# Patient Record
Sex: Female | Born: 1940
Health system: Southern US, Community
[De-identification: ages and names within clinical notes are randomized; demographics above are authoritative.]

## PROBLEM LIST (undated history)

## (undated) DIAGNOSIS — M1990 Unspecified osteoarthritis, unspecified site: Secondary | ICD-10-CM

## (undated) DIAGNOSIS — E785 Hyperlipidemia, unspecified: Secondary | ICD-10-CM

## (undated) DIAGNOSIS — T8859XA Other complications of anesthesia, initial encounter: Secondary | ICD-10-CM

## (undated) DIAGNOSIS — D649 Anemia, unspecified: Secondary | ICD-10-CM

## (undated) DIAGNOSIS — C4491 Basal cell carcinoma of skin, unspecified: Secondary | ICD-10-CM

## (undated) DIAGNOSIS — I2699 Other pulmonary embolism without acute cor pulmonale: Secondary | ICD-10-CM

## (undated) DIAGNOSIS — N189 Chronic kidney disease, unspecified: Secondary | ICD-10-CM

## (undated) DIAGNOSIS — I82409 Acute embolism and thrombosis of unspecified deep veins of unspecified lower extremity: Secondary | ICD-10-CM

## (undated) DIAGNOSIS — K219 Gastro-esophageal reflux disease without esophagitis: Secondary | ICD-10-CM

## (undated) DIAGNOSIS — G839 Paralytic syndrome, unspecified: Secondary | ICD-10-CM

## (undated) DIAGNOSIS — I10 Essential (primary) hypertension: Secondary | ICD-10-CM

## (undated) DIAGNOSIS — J8410 Pulmonary fibrosis, unspecified: Secondary | ICD-10-CM

## (undated) DIAGNOSIS — T4145XA Adverse effect of unspecified anesthetic, initial encounter: Secondary | ICD-10-CM

## (undated) HISTORY — DX: Basal cell carcinoma of skin, unspecified: C44.91

## (undated) HISTORY — PX: TUBAL LIGATION: SHX77

## (undated) HISTORY — DX: Hyperlipidemia, unspecified: E78.5

## (undated) HISTORY — DX: Other pulmonary embolism without acute cor pulmonale: I26.99

## (undated) HISTORY — DX: Essential (primary) hypertension: I10

## (undated) HISTORY — DX: Acute embolism and thrombosis of unspecified deep veins of unspecified lower extremity: I82.409

## (undated) HISTORY — PX: BUNIONECTOMY: SHX129

## (undated) HISTORY — PX: SEPTOPLASTY: SUR1290

## (undated) HISTORY — DX: Pulmonary fibrosis, unspecified: J84.10

## (undated) HISTORY — PX: COLONOSCOPY: SHX174

## (undated) HISTORY — DX: Paralytic syndrome, unspecified: G83.9

## (undated) HISTORY — PX: TONSILLECTOMY: SUR1361

## (undated) SURGERY — Surgical Case
Anesthesia: *Unknown

---

## 1961-05-30 DIAGNOSIS — N189 Chronic kidney disease, unspecified: Secondary | ICD-10-CM

## 1961-05-30 HISTORY — DX: Chronic kidney disease, unspecified: N18.9

## 1998-02-10 ENCOUNTER — Other Ambulatory Visit: Admission: RE | Admit: 1998-02-10 | Discharge: 1998-02-10 | Payer: Self-pay | Admitting: Obstetrics and Gynecology

## 1999-02-11 ENCOUNTER — Other Ambulatory Visit: Admission: RE | Admit: 1999-02-11 | Discharge: 1999-02-11 | Payer: Self-pay | Admitting: Obstetrics and Gynecology

## 2000-05-30 DIAGNOSIS — J8410 Pulmonary fibrosis, unspecified: Secondary | ICD-10-CM | POA: Insufficient documentation

## 2000-05-30 HISTORY — DX: Pulmonary fibrosis, unspecified: J84.10

## 2000-05-30 LAB — HM COLONOSCOPY

## 2000-06-20 ENCOUNTER — Other Ambulatory Visit: Admission: RE | Admit: 2000-06-20 | Discharge: 2000-06-20 | Payer: Self-pay | Admitting: Obstetrics and Gynecology

## 2000-11-09 ENCOUNTER — Other Ambulatory Visit: Admission: RE | Admit: 2000-11-09 | Discharge: 2000-11-09 | Payer: Self-pay | Admitting: Obstetrics and Gynecology

## 2001-05-18 ENCOUNTER — Other Ambulatory Visit: Admission: RE | Admit: 2001-05-18 | Discharge: 2001-05-18 | Payer: Self-pay | Admitting: Obstetrics and Gynecology

## 2001-09-07 ENCOUNTER — Other Ambulatory Visit: Admission: RE | Admit: 2001-09-07 | Discharge: 2001-09-07 | Payer: Self-pay | Admitting: Obstetrics and Gynecology

## 2002-05-30 DIAGNOSIS — I10 Essential (primary) hypertension: Secondary | ICD-10-CM

## 2002-05-30 HISTORY — DX: Essential (primary) hypertension: I10

## 2002-09-11 ENCOUNTER — Other Ambulatory Visit: Admission: RE | Admit: 2002-09-11 | Discharge: 2002-09-11 | Payer: Self-pay | Admitting: Obstetrics and Gynecology

## 2003-09-17 ENCOUNTER — Other Ambulatory Visit: Admission: RE | Admit: 2003-09-17 | Discharge: 2003-09-17 | Payer: Self-pay | Admitting: Obstetrics and Gynecology

## 2004-05-13 ENCOUNTER — Ambulatory Visit: Payer: Self-pay | Admitting: Internal Medicine

## 2004-05-30 DIAGNOSIS — G839 Paralytic syndrome, unspecified: Secondary | ICD-10-CM | POA: Insufficient documentation

## 2004-05-30 DIAGNOSIS — I2699 Other pulmonary embolism without acute cor pulmonale: Secondary | ICD-10-CM

## 2004-05-30 DIAGNOSIS — I82409 Acute embolism and thrombosis of unspecified deep veins of unspecified lower extremity: Secondary | ICD-10-CM

## 2004-05-30 HISTORY — DX: Paralytic syndrome, unspecified: G83.9

## 2004-05-30 HISTORY — PX: CERVICAL FUSION: SHX112

## 2004-05-30 HISTORY — PX: OTHER SURGICAL HISTORY: SHX169

## 2004-05-30 HISTORY — DX: Acute embolism and thrombosis of unspecified deep veins of unspecified lower extremity: I82.409

## 2004-05-30 HISTORY — DX: Other pulmonary embolism without acute cor pulmonale: I26.99

## 2004-07-04 ENCOUNTER — Encounter: Admission: RE | Admit: 2004-07-04 | Discharge: 2004-07-04 | Payer: Self-pay | Admitting: Family Medicine

## 2004-07-23 ENCOUNTER — Encounter: Admission: RE | Admit: 2004-07-23 | Discharge: 2004-07-23 | Payer: Self-pay | Admitting: Family Medicine

## 2004-07-27 ENCOUNTER — Ambulatory Visit: Payer: Self-pay | Admitting: Internal Medicine

## 2004-08-04 ENCOUNTER — Encounter: Admission: RE | Admit: 2004-08-04 | Discharge: 2004-08-04 | Payer: Self-pay | Admitting: Family Medicine

## 2004-08-11 ENCOUNTER — Ambulatory Visit: Payer: Self-pay | Admitting: Family Medicine

## 2004-09-09 ENCOUNTER — Ambulatory Visit: Payer: Self-pay | Admitting: Internal Medicine

## 2004-09-20 ENCOUNTER — Encounter: Admission: RE | Admit: 2004-09-20 | Discharge: 2004-09-20 | Payer: Self-pay | Admitting: Neurosurgery

## 2004-10-12 ENCOUNTER — Ambulatory Visit: Payer: Self-pay | Admitting: Physical Medicine & Rehabilitation

## 2004-10-14 ENCOUNTER — Inpatient Hospital Stay (HOSPITAL_COMMUNITY): Admission: RE | Admit: 2004-10-14 | Discharge: 2004-10-21 | Payer: Self-pay | Admitting: Neurosurgery

## 2004-10-21 ENCOUNTER — Inpatient Hospital Stay (HOSPITAL_COMMUNITY)
Admission: RE | Admit: 2004-10-21 | Discharge: 2004-10-28 | Payer: Self-pay | Admitting: Physical Medicine & Rehabilitation

## 2004-11-01 ENCOUNTER — Encounter
Admission: RE | Admit: 2004-11-01 | Discharge: 2005-01-30 | Payer: Self-pay | Admitting: Physical Medicine & Rehabilitation

## 2004-11-02 ENCOUNTER — Ambulatory Visit: Payer: Self-pay | Admitting: Internal Medicine

## 2004-11-11 ENCOUNTER — Encounter: Admission: RE | Admit: 2004-11-11 | Discharge: 2004-11-11 | Payer: Self-pay | Admitting: Neurosurgery

## 2004-11-12 ENCOUNTER — Inpatient Hospital Stay (HOSPITAL_COMMUNITY): Admission: AD | Admit: 2004-11-12 | Discharge: 2004-11-17 | Payer: Self-pay | Admitting: Internal Medicine

## 2004-11-12 ENCOUNTER — Ambulatory Visit: Payer: Self-pay | Admitting: Internal Medicine

## 2004-11-19 ENCOUNTER — Ambulatory Visit: Payer: Self-pay | Admitting: Internal Medicine

## 2004-11-26 ENCOUNTER — Ambulatory Visit: Payer: Self-pay | Admitting: Internal Medicine

## 2004-12-03 ENCOUNTER — Ambulatory Visit: Payer: Self-pay | Admitting: Cardiology

## 2004-12-16 ENCOUNTER — Encounter: Admission: RE | Admit: 2004-12-16 | Discharge: 2004-12-16 | Payer: Self-pay | Admitting: Neurosurgery

## 2004-12-20 ENCOUNTER — Ambulatory Visit: Payer: Self-pay | Admitting: Cardiology

## 2004-12-21 ENCOUNTER — Ambulatory Visit: Payer: Self-pay | Admitting: Internal Medicine

## 2004-12-27 ENCOUNTER — Ambulatory Visit: Payer: Self-pay | Admitting: Cardiology

## 2005-01-07 ENCOUNTER — Ambulatory Visit (HOSPITAL_COMMUNITY): Admission: RE | Admit: 2005-01-07 | Discharge: 2005-01-07 | Payer: Self-pay | Admitting: Family Medicine

## 2005-01-07 ENCOUNTER — Ambulatory Visit: Payer: Self-pay | Admitting: Family Medicine

## 2005-01-10 ENCOUNTER — Ambulatory Visit: Payer: Self-pay | Admitting: Cardiology

## 2005-01-19 ENCOUNTER — Other Ambulatory Visit: Admission: RE | Admit: 2005-01-19 | Discharge: 2005-01-19 | Payer: Self-pay | Admitting: Obstetrics and Gynecology

## 2005-01-20 ENCOUNTER — Ambulatory Visit: Payer: Self-pay | Admitting: Cardiology

## 2005-02-04 ENCOUNTER — Ambulatory Visit: Payer: Self-pay | Admitting: Cardiology

## 2005-02-18 ENCOUNTER — Ambulatory Visit: Payer: Self-pay | Admitting: Cardiology

## 2005-03-11 ENCOUNTER — Ambulatory Visit: Payer: Self-pay | Admitting: Cardiology

## 2005-03-22 ENCOUNTER — Ambulatory Visit: Payer: Self-pay | Admitting: Internal Medicine

## 2005-03-25 ENCOUNTER — Ambulatory Visit: Payer: Self-pay | Admitting: Internal Medicine

## 2005-04-12 ENCOUNTER — Ambulatory Visit: Payer: Self-pay | Admitting: Internal Medicine

## 2005-05-09 ENCOUNTER — Ambulatory Visit: Payer: Self-pay | Admitting: Internal Medicine

## 2005-05-10 ENCOUNTER — Ambulatory Visit: Payer: Self-pay | Admitting: Cardiology

## 2005-05-17 ENCOUNTER — Ambulatory Visit: Payer: Self-pay | Admitting: Internal Medicine

## 2005-05-27 ENCOUNTER — Ambulatory Visit: Payer: Self-pay

## 2005-06-14 ENCOUNTER — Ambulatory Visit: Payer: Self-pay | Admitting: *Deleted

## 2005-06-27 ENCOUNTER — Ambulatory Visit: Payer: Self-pay | Admitting: Cardiology

## 2005-07-08 ENCOUNTER — Ambulatory Visit: Payer: Self-pay | Admitting: Internal Medicine

## 2005-07-14 ENCOUNTER — Ambulatory Visit: Payer: Self-pay | Admitting: Cardiology

## 2005-08-04 ENCOUNTER — Ambulatory Visit: Payer: Self-pay | Admitting: Cardiology

## 2005-09-01 ENCOUNTER — Ambulatory Visit: Payer: Self-pay | Admitting: Cardiology

## 2005-09-06 ENCOUNTER — Ambulatory Visit: Payer: Self-pay | Admitting: Internal Medicine

## 2005-09-29 ENCOUNTER — Ambulatory Visit: Payer: Self-pay | Admitting: Cardiology

## 2005-10-20 ENCOUNTER — Ambulatory Visit: Payer: Self-pay | Admitting: Internal Medicine

## 2005-11-04 ENCOUNTER — Ambulatory Visit: Payer: Self-pay | Admitting: Cardiology

## 2005-11-04 ENCOUNTER — Ambulatory Visit: Payer: Self-pay

## 2006-01-05 ENCOUNTER — Ambulatory Visit: Payer: Self-pay | Admitting: Internal Medicine

## 2006-02-28 ENCOUNTER — Ambulatory Visit: Payer: Self-pay | Admitting: Internal Medicine

## 2006-03-16 ENCOUNTER — Ambulatory Visit: Payer: Self-pay | Admitting: Internal Medicine

## 2006-04-07 ENCOUNTER — Ambulatory Visit: Payer: Self-pay | Admitting: Internal Medicine

## 2006-04-13 ENCOUNTER — Ambulatory Visit: Payer: Self-pay | Admitting: Internal Medicine

## 2006-05-08 ENCOUNTER — Ambulatory Visit: Payer: Self-pay | Admitting: Internal Medicine

## 2006-05-08 LAB — CONVERTED CEMR LAB
Basophils Absolute: 0 10*3/uL (ref 0.0–0.1)
Eosinophil percent: 2 % (ref 0.0–5.0)
MCV: 88.5 fL (ref 78.0–100.0)
Platelets: 321 10*3/uL (ref 150–400)
Potassium: 4.1 meq/L (ref 3.5–5.1)
RBC: 3.94 M/uL (ref 3.87–5.11)
RDW: 13.8 % (ref 11.5–14.6)
T3, Free: 3 pg/mL (ref 2.3–4.2)
TSH: 5.88 microintl units/mL — ABNORMAL HIGH (ref 0.35–5.50)
WBC: 6.2 10*3/uL (ref 4.5–10.5)

## 2006-07-27 ENCOUNTER — Ambulatory Visit: Payer: Self-pay | Admitting: Internal Medicine

## 2006-08-31 DIAGNOSIS — Z86711 Personal history of pulmonary embolism: Secondary | ICD-10-CM | POA: Insufficient documentation

## 2006-08-31 DIAGNOSIS — I2699 Other pulmonary embolism without acute cor pulmonale: Secondary | ICD-10-CM

## 2006-11-24 ENCOUNTER — Ambulatory Visit: Payer: Self-pay | Admitting: Internal Medicine

## 2006-12-05 ENCOUNTER — Ambulatory Visit: Payer: Self-pay | Admitting: Internal Medicine

## 2006-12-08 ENCOUNTER — Encounter: Admission: RE | Admit: 2006-12-08 | Discharge: 2006-12-08 | Payer: Self-pay | Admitting: Family Medicine

## 2007-04-04 ENCOUNTER — Ambulatory Visit: Payer: Self-pay | Admitting: Internal Medicine

## 2007-04-12 ENCOUNTER — Ambulatory Visit: Payer: Self-pay | Admitting: Internal Medicine

## 2007-05-14 ENCOUNTER — Ambulatory Visit: Payer: Self-pay | Admitting: Internal Medicine

## 2007-05-14 DIAGNOSIS — M25519 Pain in unspecified shoulder: Secondary | ICD-10-CM

## 2007-05-14 DIAGNOSIS — E782 Mixed hyperlipidemia: Secondary | ICD-10-CM | POA: Insufficient documentation

## 2007-05-14 DIAGNOSIS — J4520 Mild intermittent asthma, uncomplicated: Secondary | ICD-10-CM | POA: Insufficient documentation

## 2007-05-14 LAB — CONVERTED CEMR LAB
Cholesterol, target level: 200 mg/dL
HDL goal, serum: 40 mg/dL

## 2007-05-26 LAB — CONVERTED CEMR LAB
Basophils Relative: 0.4 % (ref 0.0–1.0)
CO2: 29 meq/L (ref 19–32)
Creatinine, Ser: 0.9 mg/dL (ref 0.4–1.2)
Direct LDL: 150.4 mg/dL
Glucose, Bld: 121 mg/dL — ABNORMAL HIGH (ref 70–99)
HCT: 35.4 % — ABNORMAL LOW (ref 36.0–46.0)
Hemoglobin: 12.3 g/dL (ref 12.0–15.0)
Hgb A1c MFr Bld: 6.5 % — ABNORMAL HIGH (ref 4.6–6.0)
Monocytes Absolute: 0.6 10*3/uL (ref 0.2–0.7)
Neutrophils Relative %: 67.1 % (ref 43.0–77.0)
Potassium: 4.2 meq/L (ref 3.5–5.1)
RDW: 13.2 % (ref 11.5–14.6)
Sodium: 140 meq/L (ref 135–145)
Total Bilirubin: 0.7 mg/dL (ref 0.3–1.2)
Total CHOL/HDL Ratio: 4.6
Total Protein: 7.4 g/dL (ref 6.0–8.3)
VLDL: 27 mg/dL (ref 0–40)

## 2007-05-28 ENCOUNTER — Encounter (INDEPENDENT_AMBULATORY_CARE_PROVIDER_SITE_OTHER): Payer: Self-pay | Admitting: *Deleted

## 2007-05-31 HISTORY — PX: ROTATOR CUFF REPAIR: SHX139

## 2007-06-08 ENCOUNTER — Ambulatory Visit: Payer: Self-pay | Admitting: Internal Medicine

## 2007-06-08 DIAGNOSIS — E119 Type 2 diabetes mellitus without complications: Secondary | ICD-10-CM

## 2007-06-12 ENCOUNTER — Ambulatory Visit: Payer: Self-pay | Admitting: Internal Medicine

## 2007-06-12 ENCOUNTER — Encounter (INDEPENDENT_AMBULATORY_CARE_PROVIDER_SITE_OTHER): Payer: Self-pay | Admitting: *Deleted

## 2007-08-06 ENCOUNTER — Ambulatory Visit: Payer: Self-pay | Admitting: Internal Medicine

## 2007-08-06 DIAGNOSIS — R030 Elevated blood-pressure reading, without diagnosis of hypertension: Secondary | ICD-10-CM | POA: Insufficient documentation

## 2007-08-06 DIAGNOSIS — M8580 Other specified disorders of bone density and structure, unspecified site: Secondary | ICD-10-CM | POA: Insufficient documentation

## 2007-08-07 ENCOUNTER — Encounter: Payer: Self-pay | Admitting: Internal Medicine

## 2007-08-10 ENCOUNTER — Encounter (INDEPENDENT_AMBULATORY_CARE_PROVIDER_SITE_OTHER): Payer: Self-pay | Admitting: *Deleted

## 2007-08-10 LAB — CONVERTED CEMR LAB: Microalb Creat Ratio: 14.1 mg/g (ref 0.0–30.0)

## 2007-08-19 ENCOUNTER — Encounter: Admission: RE | Admit: 2007-08-19 | Discharge: 2007-08-19 | Payer: Self-pay | Admitting: Family Medicine

## 2007-09-26 ENCOUNTER — Ambulatory Visit: Payer: Self-pay | Admitting: Internal Medicine

## 2007-10-08 DIAGNOSIS — J3089 Other allergic rhinitis: Secondary | ICD-10-CM

## 2007-10-08 DIAGNOSIS — J302 Other seasonal allergic rhinitis: Secondary | ICD-10-CM

## 2007-10-09 ENCOUNTER — Encounter: Payer: Self-pay | Admitting: Internal Medicine

## 2007-10-16 ENCOUNTER — Ambulatory Visit (HOSPITAL_COMMUNITY): Admission: RE | Admit: 2007-10-16 | Discharge: 2007-10-17 | Payer: Self-pay | Admitting: Orthopedic Surgery

## 2007-11-15 ENCOUNTER — Ambulatory Visit: Payer: Self-pay | Admitting: Internal Medicine

## 2007-11-16 ENCOUNTER — Ambulatory Visit: Payer: Self-pay | Admitting: Internal Medicine

## 2007-11-27 ENCOUNTER — Encounter (INDEPENDENT_AMBULATORY_CARE_PROVIDER_SITE_OTHER): Payer: Self-pay | Admitting: *Deleted

## 2007-11-27 LAB — CONVERTED CEMR LAB: Vit D, 1,25-Dihydroxy: 45 (ref 30–89)

## 2008-01-17 ENCOUNTER — Ambulatory Visit: Payer: Self-pay | Admitting: Internal Medicine

## 2008-03-17 ENCOUNTER — Telehealth (INDEPENDENT_AMBULATORY_CARE_PROVIDER_SITE_OTHER): Payer: Self-pay | Admitting: *Deleted

## 2008-05-14 ENCOUNTER — Ambulatory Visit: Payer: Self-pay | Admitting: Internal Medicine

## 2008-05-29 ENCOUNTER — Ambulatory Visit: Payer: Self-pay | Admitting: Emergency Medicine

## 2008-06-10 ENCOUNTER — Ambulatory Visit: Payer: Self-pay | Admitting: Internal Medicine

## 2008-06-16 ENCOUNTER — Ambulatory Visit: Payer: Self-pay | Admitting: Internal Medicine

## 2008-06-16 DIAGNOSIS — M25579 Pain in unspecified ankle and joints of unspecified foot: Secondary | ICD-10-CM

## 2008-06-18 ENCOUNTER — Encounter (INDEPENDENT_AMBULATORY_CARE_PROVIDER_SITE_OTHER): Payer: Self-pay | Admitting: *Deleted

## 2008-06-18 LAB — CONVERTED CEMR LAB
Basophils Absolute: 0 10*3/uL (ref 0.0–0.1)
Basophils Relative: 0.1 % (ref 0.0–3.0)
Calcium: 9.3 mg/dL (ref 8.4–10.5)
Chloride: 106 meq/L (ref 96–112)
Cholesterol: 191 mg/dL (ref 0–200)
Creatinine, Ser: 0.9 mg/dL (ref 0.4–1.2)
Creatinine,U: 121.3 mg/dL
Eosinophils Absolute: 0.1 10*3/uL (ref 0.0–0.7)
GFR calc Af Amer: 80 mL/min
GFR calc non Af Amer: 66 mL/min
Hgb A1c MFr Bld: 6.5 % — ABNORMAL HIGH (ref 4.6–6.0)
MCHC: 34 g/dL (ref 30.0–36.0)
MCV: 83.8 fL (ref 78.0–100.0)
Microalb Creat Ratio: 2.5 mg/g (ref 0.0–30.0)
Microalb, Ur: 0.3 mg/dL (ref 0.0–1.9)
Monocytes Absolute: 0.6 10*3/uL (ref 0.1–1.0)
Neutro Abs: 7.3 10*3/uL (ref 1.4–7.7)
Neutrophils Relative %: 77.6 % — ABNORMAL HIGH (ref 43.0–77.0)
RBC: 4.39 M/uL (ref 3.87–5.11)
Total Bilirubin: 0.6 mg/dL (ref 0.3–1.2)
Uric Acid, Serum: 5.2 mg/dL (ref 2.4–7.0)
VLDL: 34 mg/dL (ref 0–40)

## 2008-06-19 ENCOUNTER — Telehealth (INDEPENDENT_AMBULATORY_CARE_PROVIDER_SITE_OTHER): Payer: Self-pay | Admitting: *Deleted

## 2008-06-20 ENCOUNTER — Encounter: Admission: RE | Admit: 2008-06-20 | Discharge: 2008-06-20 | Payer: Self-pay | Admitting: Family Medicine

## 2008-06-20 ENCOUNTER — Encounter: Payer: Self-pay | Admitting: Internal Medicine

## 2008-06-23 ENCOUNTER — Encounter: Payer: Self-pay | Admitting: Internal Medicine

## 2008-06-23 ENCOUNTER — Ambulatory Visit: Payer: Self-pay | Admitting: Internal Medicine

## 2008-07-07 ENCOUNTER — Encounter (INDEPENDENT_AMBULATORY_CARE_PROVIDER_SITE_OTHER): Payer: Self-pay | Admitting: *Deleted

## 2008-07-18 ENCOUNTER — Telehealth (INDEPENDENT_AMBULATORY_CARE_PROVIDER_SITE_OTHER): Payer: Self-pay | Admitting: *Deleted

## 2008-08-12 ENCOUNTER — Telehealth (INDEPENDENT_AMBULATORY_CARE_PROVIDER_SITE_OTHER): Payer: Self-pay | Admitting: *Deleted

## 2008-10-30 ENCOUNTER — Telehealth (INDEPENDENT_AMBULATORY_CARE_PROVIDER_SITE_OTHER): Payer: Self-pay | Admitting: *Deleted

## 2008-11-04 ENCOUNTER — Ambulatory Visit: Payer: Self-pay | Admitting: Internal Medicine

## 2008-11-06 ENCOUNTER — Ambulatory Visit: Payer: Self-pay | Admitting: Internal Medicine

## 2008-11-10 ENCOUNTER — Ambulatory Visit: Payer: Self-pay | Admitting: Internal Medicine

## 2008-11-10 ENCOUNTER — Encounter (INDEPENDENT_AMBULATORY_CARE_PROVIDER_SITE_OTHER): Payer: Self-pay | Admitting: *Deleted

## 2009-01-14 ENCOUNTER — Ambulatory Visit: Payer: Self-pay | Admitting: Internal Medicine

## 2009-03-20 ENCOUNTER — Telehealth (INDEPENDENT_AMBULATORY_CARE_PROVIDER_SITE_OTHER): Payer: Self-pay | Admitting: *Deleted

## 2009-05-11 ENCOUNTER — Ambulatory Visit: Payer: Self-pay | Admitting: Internal Medicine

## 2009-07-08 ENCOUNTER — Encounter: Payer: Self-pay | Admitting: Internal Medicine

## 2009-07-14 ENCOUNTER — Ambulatory Visit: Payer: Self-pay | Admitting: Internal Medicine

## 2009-09-16 ENCOUNTER — Ambulatory Visit: Payer: Self-pay | Admitting: Internal Medicine

## 2009-09-16 DIAGNOSIS — N959 Unspecified menopausal and perimenopausal disorder: Secondary | ICD-10-CM | POA: Insufficient documentation

## 2009-09-16 DIAGNOSIS — M2550 Pain in unspecified joint: Secondary | ICD-10-CM | POA: Insufficient documentation

## 2009-09-16 LAB — CONVERTED CEMR LAB: Microalb Creat Ratio: 2.5 mg/g (ref 0.0–30.0)

## 2009-09-17 LAB — CONVERTED CEMR LAB
ALT: 23 units/L (ref 0–35)
Alkaline Phosphatase: 74 units/L (ref 39–117)
BUN: 13 mg/dL (ref 6–23)
Bilirubin, Direct: 0.1 mg/dL (ref 0.0–0.3)
Calcium: 9.2 mg/dL (ref 8.4–10.5)
Cholesterol: 215 mg/dL — ABNORMAL HIGH (ref 0–200)
Creatinine, Ser: 1 mg/dL (ref 0.4–1.2)
Eosinophils Relative: 2 % (ref 0.0–5.0)
GFR calc non Af Amer: 58.49 mL/min (ref >60)
HDL: 58.9 mg/dL (ref >39.00)
Hgb A1c MFr Bld: 6.5 % (ref 4.6–6.5)
Lymphocytes Relative: 21.2 % (ref 12.0–46.0)
MCV: 87.7 fL (ref 78.0–100.0)
Monocytes Absolute: 0.6 10*3/uL (ref 0.1–1.0)
Monocytes Relative: 6.7 % (ref 3.0–12.0)
Neutrophils Relative %: 69.7 % (ref 43.0–77.0)
Platelets: 250 10*3/uL (ref 150.0–400.0)
Total Bilirubin: 0.6 mg/dL (ref 0.3–1.2)
Triglycerides: 125 mg/dL (ref 0.0–149.0)
VLDL: 25 mg/dL (ref 0.0–40.0)
WBC: 8.2 10*3/uL (ref 4.5–10.5)

## 2009-10-13 ENCOUNTER — Telehealth (INDEPENDENT_AMBULATORY_CARE_PROVIDER_SITE_OTHER): Payer: Self-pay | Admitting: *Deleted

## 2009-10-15 ENCOUNTER — Ambulatory Visit: Payer: Self-pay | Admitting: Internal Medicine

## 2009-11-09 ENCOUNTER — Ambulatory Visit: Payer: Self-pay | Admitting: Internal Medicine

## 2010-02-03 ENCOUNTER — Telehealth: Payer: Self-pay | Admitting: Internal Medicine

## 2010-03-02 ENCOUNTER — Ambulatory Visit: Payer: Self-pay | Admitting: Internal Medicine

## 2010-04-28 ENCOUNTER — Telehealth (INDEPENDENT_AMBULATORY_CARE_PROVIDER_SITE_OTHER): Payer: Self-pay | Admitting: *Deleted

## 2010-05-10 ENCOUNTER — Ambulatory Visit: Payer: Self-pay | Admitting: Internal Medicine

## 2010-05-18 ENCOUNTER — Telehealth (INDEPENDENT_AMBULATORY_CARE_PROVIDER_SITE_OTHER): Payer: Self-pay | Admitting: *Deleted

## 2010-06-09 ENCOUNTER — Telehealth (INDEPENDENT_AMBULATORY_CARE_PROVIDER_SITE_OTHER): Payer: Self-pay | Admitting: *Deleted

## 2010-06-14 ENCOUNTER — Telehealth (INDEPENDENT_AMBULATORY_CARE_PROVIDER_SITE_OTHER): Payer: Self-pay | Admitting: *Deleted

## 2010-06-15 ENCOUNTER — Ambulatory Visit
Admission: RE | Admit: 2010-06-15 | Discharge: 2010-06-15 | Payer: Self-pay | Source: Home / Self Care | Attending: Internal Medicine | Admitting: Internal Medicine

## 2010-06-15 DIAGNOSIS — J069 Acute upper respiratory infection, unspecified: Secondary | ICD-10-CM | POA: Insufficient documentation

## 2010-06-18 ENCOUNTER — Ambulatory Visit: Payer: Self-pay | Admitting: Internal Medicine

## 2010-06-20 ENCOUNTER — Encounter: Payer: Self-pay | Admitting: Family Medicine

## 2010-06-29 NOTE — Progress Notes (Signed)
Summary: Proair refill  Phone Note From Pharmacy      New/Updated Medications: PROAIR HFA 108 (90 BASE) MCG/ACT  AERS (ALBUTEROL SULFATE) 2 puffs four times a day as needed rescue Prescriptions: PROAIR HFA 108 (90 BASE) MCG/ACT  AERS (ALBUTEROL SULFATE) 2 puffs four times a day as needed rescue  #3 x 3   Entered and Authorized by:   Waymon Budge MD   Signed by:   Waymon Budge MD on 02/03/2010   Method used:   Printed then faxed to ...       [facility] (retail)       [address]       [place], [place]  [address]       Ph: [phone]       Fax: [phone]   RxID:   [id number]

## 2010-06-29 NOTE — Progress Notes (Signed)
Summary: Refill Request  Phone Note From Pharmacy   Caller: [facility] Summary of Call: Patient would like to change chlosterol med to Simvastatin because it is on [facility] 100pills for 10 dollars list.   Per Dr.Hopper ok to change./[facility]  Oct 13, 2009 1:54 PM     New/Updated Medications: SIMVASTATIN 20 MG TABS (SIMVASTATIN) 1 by mouth at bedtime Prescriptions: SIMVASTATIN 20 MG TABS (SIMVASTATIN) 1 by mouth at bedtime  #100 x 0   Entered by:   Shonna Chock   Authorized by:   Marga Melnick MD   Signed by:   Shonna Chock on 10/13/2009   Method used:   Faxed to ...       [facility] (retail)       [address]       [place], [place]  [address]       Ph: [phone]       Fax: [phone]   RxID:   [id number]

## 2010-06-29 NOTE — Assessment & Plan Note (Signed)
Summary: rov 6 months///kp   Primary Provider/Referring Provider:  Alwyn Ren  CC:  6 month follow up visit-allergies-congestion, drainage, and cough-light yellow in color. .  History of Present Illness:  05/14/08- Allergic rhinitis, asthma Tends to wake hoarse, suspects she snores but is alone. Likes Singulair. Uses Clarinex and dicussed claritin. No major flares needing neb/ steroid in long time.Had both flu vax. Denies wheeze, cough, phlegm.  11/10/08- Allergic rhinitis, asthma Got hoarse again about a week ago. She finds clarinex works better for her than claritin- we discussed this. Denies recent cold, reflux, sore throat. a little wheezy on forced exhalation. Hoarse before trip to mountains and maybe a little worse by return. Using albuterol more often. Discussed mold spores and current rainy weather.   May 11, 2009- Allergic rhinitis, asthma Feeling pretty well overall, but mentions some nasal drainage/ yellow. Occasional sneeze, but denies headaches, fever, sore throat, ear ache. Sometimes left ear congested- not now.  Uses Advair 250- lower dose didn't hold her.   November 09, 2009- Allergic rhinitis, asthma Had flare of nasal congestion and drainage  a few weeks ago. Given amoxacillin incidentally from her orthopedist. Used Neti pot, bed rest x 2 days. She assumed viral- no energy. She thought she was well, but starting lat week she has relapsed with nasal congestion, drainage, yellow discharge. Denies fever, earache, headache, deep green r brown. A little chest congestion. No GI upset. had variabley used Advair 250 or 100. Wheeze started yesterday. She has been using Advair 250 in AM, 100 at night.    Asthma History    Asthma Control Assessment:    Age range: 12+ years    Symptoms: >2 days/week    Nighttime Awakenings: 0-2/month    Interferes w/ normal activity: some limitations    SABA use (not for EIB): >2 days/week    Asthma Control Assessment: Not Well  Controlled   Preventive Screening-Counseling & Management  Alcohol-Tobacco     Smoking Status: never  Current Medications (verified): 1)  Advair Diskus 250-50 Mcg/dose Misc (Fluticasone-Salmeterol) .[name].. 1 Puff Twice A Day 2)  Singulair 10 Mg  Tabs (Montelukast Sodium) .[name].. 1 By Mouth Qd 3)  Claritin 10 Mg Tabs (Loratadine) .... Once Daily 4)  Freestyle Lite   Strp (Glucose Blood) .... Use One Daily 5)  Freestyle Unistick Ii Lancets   Misc (Lancets) .... Use One Daily 6)  Metformin Hcl 500 Mg  Tb24 (Metformin Hcl) .[name].. 1  Two Times A Day With 2 Largest Meals 7)  Allergy Vaccine  1:10 8)  Epipen 0.3 Mg/0.51ml (1:1000)  Devi (Epinephrine Hcl (Anaphylaxis)) .... For Severe Allergic Reaction 9)  Proair Hfa 108 (90 Base) Mcg/act  Aers (Albuterol Sulfate) .... As Needed 10)  Prilosec 20 Mg  Cpdr (Omeprazole) .... As Needed 11)  Nasonex 50 Mcg/act  Susp (Mometasone Furoate) .[name].. 1-2 Sprays Each Nostril Daily 12)  Astepro 0.15 % Soln (Azelastine Hcl) .[name].. 1-2 Sprays Two Times A Day As Needed 13)  Arthrotec 75-200 Mg-Mcg Tabs (Diclofenac-Misoprostol) .... Take 1 By Mouth Once Daily 14)  Hydrocodone-Acetaminophen 5-325 Mg Tabs (Hydrocodone-Acetaminophen) .... As Needed 15)  Simvastatin 20 Mg Tabs (Simvastatin) .[name].. 1 By Mouth At Bedtime  Allergies (verified): No Known Drug Allergies  Past History:  Family History: Last updated: 09/16/2009 maternal grandmother cva paternal aunt mini stroke Mother-deceased age 76; cns  cancer; allergies Father-deceased age 78; emphysema,allergies Sister-  DM, heart disease/ stents,fibromyalgia, thyroid disease Sister- arthritis, Malachi Carl  Social History: Last updated: 09/16/2009 Never Smoked Alcohol  use-yes socially No diet Divorced  Retired Regular exercise-yes: treadmill, Nautilus, elliptical  Risk Factors: Exercise: yes (09/16/2009)  Risk Factors: Smoking Status: never (11/09/2009)  Past medical, surgical, family and social histories  (including risk factors) reviewed for relevance to current acute and chronic problems.  Past Medical History: Reviewed history from 09/16/2009 and no changes required. Asthma Hyperlipidemia PTE/ DVT post op cervical spine surgery 2006 AODM (ICD-250.00) ELEVATED BLOOD PRESSURE WITHOUT DIAGNOSIS OF HYPERTENSION (ICD-796.2)(HTN @ stress test 2004) Remote granulomatous disease Allergic rhinitis Porokeratosis, Dr Karlyn Agee   Past Surgical History: Reviewed history from 09/16/2009 and no changes required. bunionectomy Tonsillectomy septoplasty tubal ligation  gravid 2 para 2 epidual/steroids 2006 cervical disc fusion, Dr Venora Maples post op, cns leak 09/2004, complicated by left hemiplegia temporarily post op & urinary retention PE/DVT  6/06 post op cervical fusions   colonoscopy 11/2005, Dr Loreta Ave, due 2012 Rotator cuff repair R shoulder 2009  Family History: Reviewed history from 09/16/2009 and no changes required. maternal grandmother cva paternal aunt mini stroke Mother-deceased age 22; cns  cancer; allergies Father-deceased age 71; emphysema,allergies Sister-  DM, heart disease/ stents,fibromyalgia, thyroid disease Sister- arthritis, Malachi Carl  Social History: Reviewed history from 09/16/2009 and no changes required. Never Smoked Alcohol use-yes socially No diet Divorced  Retired Regular exercise-yes: treadmill, Nautilus, elliptical  Review of Systems      See HPI       The patient complains of shortness of breath with activity, productive cough, and nasal congestion/difficulty breathing through nose.  The patient denies non-productive cough, coughing up blood, chest pain, irregular heartbeats, acid heartburn, indigestion, loss of appetite, weight change, abdominal pain, difficulty swallowing, sore throat, tooth/dental problems, headaches, and sneezing.    Vital Signs:  Patient profile:   70 year old female Height:      63.75 inches Weight:      148 pounds BMI:      25.70 O2 Sat:      97 % on Room air Pulse rate:   75 / minute BP sitting:   136 / 76  (left arm) Cuff size:   regular  Vitals Entered By: Reynaldo Minium CMA (November 09, 2009 9:46 AM)  O2 Flow:  Room air CC: 6 month follow up visit-allergies-congestion, drainage, cough-light yellow in color.    Physical Exam  Additional Exam:  General: A/Ox3; pleasant and cooperative, NAD, SKIN: no rash, lesions NODES: no lymphadenopathy HEENT: Mallard/AT, EOM- WNL, Conjuctivae- clear, PERRLA, TM-WNL, Nose- nasal, Throat- minimal thrush, Mallampati III, no stridor, no drip NECK: Supple w/ fair ROM, JVD- none, normal carotid impulses w/o bruits Thyroid-  CHEST: bilateral wheeze and dry cough HEART: RRR, no m/g/r heard ABDOMEN: Soft and nl; nml bowel sounds; no organomegaly or masses noted LOV:FIEP, nl pulses, no edema  NEURO: Grossly intact to observation- moves well bilaterally      Impression & Recommendations:  Problem # 1:  ALLERGIC RHINITIS (ICD-477.9)  Background of rhinitis, but this is rhinosinusitis, likely with bacterial infection. Will use biaxin.    Claritin 10 Mg Tabs (Loratadine) ..... Once daily    Nasonex 50 Mcg/act Susp (Mometasone furoate) .[name]... 1-2 sprays each nostril daily    Astepro 0.15 % Soln (Azelastine hcl) .[name]... 1-2 sprays two times a day as needed  Problem # 2:  ASTHMA (ICD-493.90) She needs meds refilled- done and reconciled.  Problem # 3:  DVT (ICD-453.40) No recurrence of venous thromboembolic disease. We reviewd measures to avoid stasis.  Medications Added to Medication List This Visit: 1)  Arthrotec 75-200 Mg-mcg Tabs (Diclofenac-misoprostol) .... Take 1 by mouth once daily 2)  Clarithromycin 500 Mg Tabs (Clarithromycin) .[name].. 1 twice daily after meals 3)  Advair Diskus 100-50 Mcg/dose Aepb (Fluticasone-salmeterol) .[name].. 1 puff and rinse mouth, twice daily  Other Orders: Est. Patient Level IV (30865)  Patient Instructions: 1)  Please schedule a follow-up  appointment in 6 months. 2)  Scripts refilled and antibiotic sent to drug store 3)  Mucinex may help thin mucus, along with extra fluids. Prescriptions: ADVAIR DISKUS 100-50 MCG/DOSE AEPB (FLUTICASONE-SALMETEROL) 1 puff and rinse mouth, twice daily  #3 x 3   Entered by:   Reynaldo Minium CMA   Authorized by:   Waymon Budge MD   Signed by:   Reynaldo Minium CMA on 11/09/2009   Method used:   Print then Give to Patient   RxID:   [id number] NASONEX 50 MCG/ACT  SUSP (MOMETASONE FUROATE) 1-2 sprays each nostril daily  #3 x 3   Entered by:   Reynaldo Minium CMA   Authorized by:   Waymon Budge MD   Signed by:   Reynaldo Minium CMA on 11/09/2009   Method used:   Print then Give to Patient   RxID:   [id number] PROAIR HFA 108 (90 BASE) MCG/ACT  AERS (ALBUTEROL SULFATE) as needed  #3 x 3   Entered by:   Reynaldo Minium CMA   Authorized by:   Waymon Budge MD   Signed by:   Reynaldo Minium CMA on 11/09/2009   Method used:   Print then Give to Patient   RxID:   [id number] ADVAIR DISKUS 250-50 MCG/DOSE MISC (FLUTICASONE-SALMETEROL) 1 puff twice a day  #3 x 3   Entered by:   Reynaldo Minium CMA   Authorized by:   Waymon Budge MD   Signed by:   Reynaldo Minium CMA on 11/09/2009   Method used:   Print then Give to Patient   RxID:   [id number] SINGULAIR 10 MG  TABS (MONTELUKAST SODIUM) 1 by mouth qd  #90 x 3   Entered by:   Reynaldo Minium CMA   Authorized by:   Waymon Budge MD   Signed by:   Reynaldo Minium CMA on 11/09/2009   Method used:   Print then Give to Patient   RxID:   [id number] EPIPEN 0.3 MG/0.3ML (1:1000)  DEVI (EPINEPHRINE HCL (ANAPHYLAXIS)) for severe allergic reaction  #1 x prn   Entered and Authorized by:   Waymon Budge MD   Signed by:   Waymon Budge MD on 11/09/2009   Method used:   Electronically to        [facility] (retail)       [address]       [place], [place]  [address]       Ph: [phone]       Fax:  [phone]   RxID:   [id number] ADVAIR DISKUS 100-50 MCG/DOSE AEPB (FLUTICASONE-SALMETEROL) 1 puff and rinse mouth, twice daily  #3 x 3   Entered and Authorized by:   Waymon Budge MD   Signed by:   Waymon Budge MD on 11/09/2009   Method used:   Electronically to        [facility] (retail)       [address]       [place], [place]  [id number]       Ph: [phone]       Fax: [phone]  RxID:   [id number] NASONEX 50 MCG/ACT  SUSP (MOMETASONE FUROATE) 1-2 sprays each nostril daily  #3 x 3   Entered and Authorized by:   Waymon Budge MD   Signed by:   Waymon Budge MD on 11/09/2009   Method used:   Electronically to        [facility] (retail)       [address]       [place], [place]  [address]       Ph: [phone]       Fax: [phone]   RxID:   [id number] PROAIR HFA 108 (90 BASE) MCG/ACT  AERS (ALBUTEROL SULFATE) as needed  #3 x 3   Entered and Authorized by:   Waymon Budge MD   Signed by:   Waymon Budge MD on 11/09/2009   Method used:   Electronically to        [facility] (retail)       [address]       [place], [place]  [address]       Ph: [phone]       Fax: [phone]   RxID:   [id number] SINGULAIR 10 MG  TABS (MONTELUKAST SODIUM) 1 by mouth qd  #90 x 3   Entered and Authorized by:   Waymon Budge MD   Signed by:   Waymon Budge MD on 11/09/2009   Method used:   Electronically to        [facility] (retail)       [address]       [place], [place]  [address]       Ph: [phone]       Fax: [phone]   RxID:   [id number] ADVAIR DISKUS 250-50 MCG/DOSE MISC (FLUTICASONE-SALMETEROL) 1 puff twice a day  #3 x 3   Entered and Authorized by:   Waymon Budge MD   Signed by:   Waymon Budge MD on 11/09/2009   Method used:   Electronically to        [facility] (retail)        [address]       [place], [place]  [address]       Ph: [phone]       Fax: [phone]   RxID:   [id number] ASTEPRO 0.15 % SOLN (AZELASTINE HCL) 1-2 sprays two times a day as needed  #1 x prn   Entered and Authorized by:   Waymon Budge MD   Signed by:   Waymon Budge MD on 11/09/2009   Method used:   Electronically to        [facility] (retail)       [address]       [place], [place]  [address]       Ph: [phone]       Fax: [phone]   RxID:   [id number] CLARITHROMYCIN 500 MG TABS (CLARITHROMYCIN) 1 twice daily after meals  #14 x 0   Entered and Authorized by:   Waymon Budge MD   Signed by:   Waymon Budge MD on 11/09/2009   Method used:   Electronically to        [facility] (retail)       [address]       [place], [place]  [address]       Ph:  [phone]       Fax: [phone]   RxID:   [id number]

## 2010-06-29 NOTE — Assessment & Plan Note (Signed)
Summary: EMP//PH   Vital Signs:  Patient profile:   70 year old female Height:      63.75 inches Weight:      151.6 pounds Temp:     98.4 degrees F oral Pulse rate:   64 / minute Resp:     14 per minute BP sitting:   128 / 84  (left arm) Cuff size:   regular  Vitals Entered By: Shonna Chock (September 16, 2009 9:22 AM)  CC: 1.) Yearly CPX with fasting 2.) Refill Meds 3.) Examine left arm-injured (fell), General Medical Evaluation Comments REVIEWED MED LIST, PATIENT AGREED DOSE AND INSTRUCTION CORRECT    Primary Care Provider:  Alwyn Ren  CC:  1.) Yearly CPX with fasting 2.) Refill Meds 3.) Examine left arm-injured (fell) and General Medical Evaluation.  History of Present Illness: [name] is here for a physical; she recently slipped & fell on stairs 09/13/2009 . She has residual pain & stiffness in neck , especially with with L lateral rotation.rx: Etodolac. Preventive measures reviewed; all are up to date including POA/ Living Will.  Preventive Screening-Counseling & Management  Caffeine-Diet-Exercise     Does Patient Exercise: yes  Allergies (verified): No Known Drug Allergies  Past History:  Past Medical History: Asthma Hyperlipidemia PTE/ DVT post op cervical spine surgery 2006 AODM (ICD-250.00) ELEVATED BLOOD PRESSURE WITHOUT DIAGNOSIS OF HYPERTENSION (ICD-796.2)(HTN @ stress test 2004) Remote granulomatous disease Allergic rhinitis Porokeratosis, Dr Karlyn Agee   Past Surgical History: bunionectomy Tonsillectomy septoplasty tubal ligation  gravid 2 para 2 epidual/steroids 2006 cervical disc fusion, Dr Venora Maples post op, cns leak 09/2004, complicated by left hemiplegia temporarily post op & urinary retention PE/DVT  6/06 post op cervical fusions   colonoscopy 11/2005, Dr Loreta Ave, due 2012 Rotator cuff repair R shoulder 2009  Family History: maternal grandmother cva paternal aunt mini stroke Mother-deceased age 49; cns  cancer; allergies Father-deceased age 26;  emphysema,allergies Sister-  DM, heart disease/ stents,fibromyalgia, thyroid disease Sister- arthritis, Epstein Barr  Social History: Never Smoked Alcohol use-yes socially No diet Divorced  Retired Regular exercise-yes: treadmill, Nautilus, elliptical Does Patient Exercise:  yes  Review of Systems General:  Denies fatigue; Pain in L hip & knee affect sleep. Eyes:  Denies blurring, double vision, and vision loss-both eyes; Exam 2011; no retinopathy. ENT:  Complains of postnasal drainage; denies difficulty swallowing and hoarseness; Minor congestion. CV:  Denies chest pain or discomfort, difficulty breathing at night, difficulty breathing while lying down, leg cramps with exertion, palpitations, swelling of feet, and swelling of hands. Resp:  Denies excessive snoring, hypersomnolence, morning headaches, and shortness of breath; Intermittent  cough & wheezing; rare use of rescue MDI with advair. Sputum from PNDrainage.[name] GI:  Denies abdominal pain, bloody stools, constipation, dark tarry stools, diarrhea, and indigestion. GU:  Denies discharge, dysuria, hematuria, and incontinence. MS:  Complains of joint pain; denies joint redness, joint swelling, low back pain, mid back pain, and thoracic pain. Derm:  Denies changes in nail beds, dryness, and hair loss. Neuro:  Complains of numbness; denies brief paralysis, tingling, and weakness; Numbness R hand over night. Psych:  Denies anxiety and depression. Endo:  Denies cold intolerance, excessive hunger, excessive thirst, excessive urination, and heat intolerance; FBS 65-90; no hypoglycemia. Heme:  Denies abnormal bruising and bleeding. Allergy:  Complains of itching eyes, seasonal allergies, and sneezing; Allergy shots weekly; Claritin  once daily .  Physical Exam  General:  well-nourished; alert,appropriate and cooperative throughout examination Head:  Normocephalic and atraumatic without obvious abnormalities.  Eyes:  No corneal or  conjunctival inflammation noted. Perrla. Funduscopic exam benign, without hemorrhages, exudates or papilledema.  Ears:  External ear exam shows no significant lesions or deformities.  Otoscopic examination reveals clear canals, tympanic membranes are intact bilaterally without bulging, retraction, inflammation or discharge. Hearing is grossly normal bilaterally. Nose:  External nasal examination shows no deformity or inflammation. Nasal mucosa are pink and moist without lesions or exudates. Mouth:  Oral mucosa and oropharynx without lesions or exudates.  Teeth in good repair. Neck:  No deformities, masses, or tenderness noted. Lungs:  Normal respiratory effort, chest expands symmetrically. Lungs are clear to auscultation, no crackles or wheezes. Heart:  normal rate, regular rhythm, no gallop, no rub, no JVD, no HJR, and grade 1/2 /6 systolic murmur.   Abdomen:  Bowel sounds positive,abdomen soft and non-tender without masses, organomegaly or hernias noted. Genitalia:  Dr Tenny Craw  Msk:  No deformity or scoliosis noted of thoracic or lumbar spine.   Pulses:  R and L carotid,radial,dorsalis pedis and posterior tibial pulses are full and equal bilaterally Extremities:  No clubbing, cyanosis, edema, or deformity noted with normal full range of motion of all joints.  Good nail health  Neurologic:  alert & oriented X3 and DTRs  slightly asymmetrical @ knees(L> R)but  normal.  Strength good in extremities. light touch normal normal over feet Skin:  Flat round hyperpigmented lesions , esp on legs Cervical Nodes:  No lymphadenopathy noted Axillary Nodes:  No palpable lymphadenopathy Psych:  memory intact for recent and remote, normally interactive, and good eye contact.     Impression & Recommendations:  Problem # 1:  PREVENTIVE HEALTH CARE (ICD-V70.0)  Orders: EKG w/ Interpretation (93000) Venipuncture (11914) TLB-Lipid Panel (80061-LIPID) TLB-BMP (Basic Metabolic Panel-BMET) (80048-METABOL) TLB-CBC  Platelet - w/Differential (85025-CBCD) TLB-Hepatic/Liver Function Pnl (80076-HEPATIC) TLB-TSH (Thyroid Stimulating Hormone) (84443-TSH) TLB-A1C / Hgb A1C (Glycohemoglobin) (83036-A1C) TLB-Microalbumin/Creat Ratio, Urine (82043-MALB) Radiology Referral (Radiology)  Problem # 2:  ARTHRALGIA (ICD-719.40)  Problem # 3:  ASTHMA (ICD-493.90) stable Her updated medication list for this problem includes:    Advair Diskus 250-50 Mcg/dose Misc (Fluticasone-salmeterol) .[name]... 1 puff twice a day    Singulair 10 Mg Tabs (Montelukast sodium) .[name]... 1 by mouth qd    Proair Hfa 108 (90 Base) Mcg/act Aers (Albuterol sulfate) .[name]... As needed  Problem # 4:  AODM (ICD-250.00)  PMH of; status to be assessed Her updated medication list for this problem includes:    Metformin Hcl 500 Mg Tb24 (Metformin hcl) .[name]... 1  two times a day with 2 largest meals  Orders: Venipuncture (78295) TLB-A1C / Hgb A1C (Glycohemoglobin) (83036-A1C) TLB-Microalbumin/Creat Ratio, Urine (82043-MALB)  Problem # 5:  ELEVATED BLOOD PRESSURE WITHOUT DIAGNOSIS OF HYPERTENSION (ICD-796.2)  Orders: EKG w/ Interpretation (93000) Venipuncture (62130)  Problem # 6:  HYPERLIPIDEMIA (ICD-272.2)  Orders: TLB-Lipid Panel (80061-LIPID)  Problem # 7:  POSTMENOPAUSAL SYNDROME (ICD-627.9)  Orders: Venipuncture (86578) Radiology Referral (Radiology)  Complete Medication List: 1)  Advair Diskus 250-50 Mcg/dose Misc (Fluticasone-salmeterol) .[name].. 1 puff twice a day 2)  Singulair 10 Mg Tabs (Montelukast sodium) .[name].. 1 by mouth qd 3)  Claritin 10 Mg Tabs (Loratadine) .... Once daily 4)  Freestyle Lite Strp (Glucose blood) .... Use one daily 5)  Freestyle Unistick Ii Lancets Misc (Lancets) .... Use one daily 6)  Metformin Hcl 500 Mg Tb24 (Metformin hcl) .[name].. 1  two times a day with 2 largest meals 7)  Allergy Vaccine 1:10  8)  Epipen 0.3 Mg/0.40ml (1:1000) Devi (Epinephrine hcl (anaphylaxis)) .Marland KitchenMarland KitchenMarland Kitchen  For severe allergic reaction 9)  Proair  Hfa 108 (90 Base) Mcg/act Aers (Albuterol sulfate) .... As needed 10)  Prilosec 20 Mg Cpdr (Omeprazole) .... As needed 11)  Nasonex 50 Mcg/act Susp (Mometasone furoate) .[name].. 1-2 sprays each nostril daily 12)  Astepro 0.15 % Soln (Azelastine hcl) .[name].. 1-2 sprays two times a day as needed 13)  Etodolac 500 Mg Tabs (Etodolac) .... Once daily 14)  Hydrocodone-acetaminophen 5-325 Mg Tabs (Hydrocodone-acetaminophen) .... As needed 15)  [facility] Magic Mouthwash  .... Swish and swallow 5 cc, four times a day  Patient Instructions: 1)  Check your blood sugars regularly. If your readings are usually above :150 or below 90 you should contact our office. 2)  See your eye doctor yearly to check for diabetic eye damage. 3)  Check your feet each night for sore areas, calluses or signs of infection. 4)  Check your Blood Pressure regularly. If it is above:135/85 ON AVERAGE you should make an appointment. Prescriptions: FREESTYLE LITE   STRP (GLUCOSE BLOOD) use one daily  #100 x 3   Entered and Authorized by:   Marga Melnick MD   Signed by:   Marga Melnick MD on 09/16/2009   Method used:   Print then Give to Patient   RxID:   [id number] METFORMIN HCL 500 MG  TB24 (METFORMIN HCL) 1  two times a day with 2 largest meals  #180 Tablet x 1   Entered and Authorized by:   Marga Melnick MD   Signed by:   Marga Melnick MD on 09/16/2009   Method used:   Print then Give to Patient   RxID:   [id number]

## 2010-06-29 NOTE — Letter (Signed)
Summary: [facility]  [facility]   Imported By: Lanelle Bal 07/16/2009 09:07:33  _____________________________________________________________________  External Attachment:    Type:   Image     Comment:   External Document

## 2010-06-29 NOTE — Progress Notes (Signed)
Summary: rx  Phone Note Call from Patient Call back at Home Phone [phone]   Caller: Patient Call For: [name] Reason for Call: Talk to Nurse Summary of Call: Just retired, need 3 rx's to send to mail order pharmacy - new plan - Advair 250/50, Singulair and Clarinex.  Can pick them up today or Monday. Initial call taken by: Eugene Gavia,  March 20, 2009 11:19 AM  Follow-up for Phone Call        Please sign rxs and return to triage thanks! Vernie Murders  March 20, 2009 11:24 AM  Follow-up by: Waymon Budge MD,  March 20, 2009 11:56 AM  Additional Follow-up for Phone Call Additional follow up Details #1::        Rxs were signed and placed up front for pick up.  LMOM for pt to be made aware. Additional Follow-up by: Vernie Murders,  March 20, 2009 12:00 PM    Prescriptions: CLARINEX 5 MG  TABS (DESLORATADINE) 1 by mouth qd  #90 x 3   Entered by:   Vernie Murders   Authorized by:   Waymon Budge MD   Signed by:   Vernie Murders on 03/20/2009   Method used:   Print then Give to Patient   RxID:   [id number] SINGULAIR 10 MG  TABS (MONTELUKAST SODIUM) 1 by mouth qd  #90 x 3   Entered by:   Vernie Murders   Authorized by:   Waymon Budge MD   Signed by:   Vernie Murders on 03/20/2009   Method used:   Print then Give to Patient   RxID:   [id number] ADVAIR DISKUS 250-50 MCG/DOSE MISC (FLUTICASONE-SALMETEROL) 1 puff twice a day  #3 x 3   Entered by:   Vernie Murders   Authorized by:   Waymon Budge MD   Signed by:   Vernie Murders on 03/20/2009   Method used:   Print then Give to Patient   RxID:   [id number]

## 2010-06-29 NOTE — Progress Notes (Signed)
Summary: sinus > z pak  Phone Note Call from Patient Call back at [phone]   Caller: Patient Call For: young Summary of Call: Pt c/o sinus, coughing up yellow sputum since "Sunday, wants to be seen today pls advise.//[facility] Initial call taken by: Juanita Davis,  April 28, 2010 9:57 AM  Follow-up for Phone Call        Pt c/o sinus congestion and cough with yellow sputum x3-4 days. She denies any increased sob, fever or sore throat. Occasional wheezing.Pls advise of any recs. Allergies (verified):  No Known Drug Allergies Lori Cooper CMA  April 28, 2010 11:38 AM  Additional Follow-up for Phone Call Additional follow up Details #1::        Per CDY-offer Zpak #1 take as directed no refills.Katie Welchel CMA  April 28, 2010 12:44 PM   Called, spoke with pt.  She was informed CY recs Z pak - take as directed.  She verbalized understanding of this and aware rx sent to [facility]  Additional Follow-up by: Crystal Jones RN,  April 28, 2010 12:46 PM    New/Updated Medications: ZITHROMAX Z-PAK 250 MG TABS (AZITHROMYCIN) take as directed Prescriptions: ZITHROMAX Z-PAK 250 MG TABS (AZITHROMYCIN) take as directed  #1 x 0   Entered by:   Crystal Jones RN   Authorized by:   Clinton D Young MD   Signed by:   Crystal Jones RN on 04/28/2010   Method used:   Electronically to        [facility] (retail)       [address]       [place], [place]  [address]       Ph: [phone]       Fax: [phone]   RxID:   [id number]

## 2010-07-01 NOTE — Progress Notes (Signed)
Summary: singulair prior auth-CDY to fill out PA info.-lmomtcb x 1   Phone Note Call from Patient Call back at Home Phone [phone]   Caller: Patient Call For: young Reason for Call: Talk to Nurse Summary of Call: Patient calling asking about the status of prior auth for singulair. Initial call taken by: Lehman Prom,  June 09, 2010 9:30 AM  Follow-up for Phone Call        LMTCBx1.  I do nto see any PA mentioned. I called [facility] and they do nto have an rx for this. Need to know where the pt got rx filled.  Carron Curie CMA  June 09, 2010 11:05 AM  Returning call.Darletta Moll  June 09, 2010 12:36 PM  I have the insurance compnay regarding this matter-too busy at this time and request we call back later. I tried calling the patient at her home number-phone disconnected at this time. Also, I called the patients work number-she will not be there until Friday. I will wait for patient to call back to inform her of this.Reynaldo Minium CMA  June 09, 2010 2:08 PM    Additional Follow-up for Phone Call Additional follow up Details #1::        I have called the PA dept for insurance company-they are having high volume of calls and request they be called back at a later time/date. There was a 3 hour wait time.Reynaldo Minium CMA  June 10, 2010 12:17 PM   Patient returning call. Lehman Prom  June 10, 2010 1:03 PM  spoke with pt and she is aware we have info for PA but are having trouble getting in touch with someone at ins comp. i have left her a few sampels at the front to last until we get PA completed. Carron Curie CMA  June 10, 2010 1:39 PM     Additional Follow-up for Phone Call Additional follow up Details #2::    Thank you for giving her samples and informing her of the situation. I will continue to call the company and hold this message until I get a response from her insurance company.Reynaldo Minium CMA  June 10, 2010 1:41 PM    PA  Dept at [employer] has faxed paperwork and has been placed on CDY's cart to sign and return to me to fax back asap.Reynaldo Minium CMA  June 15, 2010 3:17 PM    Paperwork has been filled out and faxed to insurance company-will await a decision.Reynaldo Minium CMA  June 15, 2010 5:41 PM   Additional Follow-up for Phone Call Additional follow up Details #3:: Details for Additional Follow-up Action Taken: Kennyth Arnold with [employer] called and advised pt is approved for Singulair 10mg  Jun 15 2010 Jun 15 2011. I called to inform pt of same and to confirm where see is wanting same filled. LMOMTCB x 1. Zackery Barefoot CMA  June 16, 2010 12:12 PM   Spoke with pt and notified of the above recs per JR.  Pt verbalized understanding. She states that she is going to mail in the rx that was already given to her by CDY, nothing further needed.  Vernie Murders  June 17, 2010 1:38 PM

## 2010-07-01 NOTE — Assessment & Plan Note (Signed)
Summary: rov 6 months///kp   Primary Provider/Referring Provider:  Alwyn Ren  CC:  6 month follow up visit-allergies..  History of Present Illness: May 11, 2009- Allergic rhinitis, asthma Feeling pretty well overall, but mentions some nasal drainage/ yellow. Occasional sneeze, but denies headaches, fever, sore throat, ear ache. Sometimes left ear congested- not now.  Uses Advair 250- lower dose didn't hold her.   November 09, 2009- Allergic rhinitis, asthma Had flare of nasal congestion and drainage  a few weeks ago. Given amoxacillin incidentally from her orthopedist. Used Neti pot, bed rest x 2 days. She assumed viral- no energy. She thought she was well, but starting lat week she has relapsed with nasal congestion, drainage, yellow discharge. Denies fever, earache, headache, deep green r brown. A little chest congestion. No GI upset. had variably used Advair 250 or 100. Wheeze started yesterday. She has been using Advair 250 in AM, 100 at night.  May 10, 2010- May 11, 2009- Allergic rhinitis, asthma 6 month follow up visit-allergy.  Sinus infection responded to a Z pak called in in November. uses Neti pot and comments it seems to stop up her left ear. No wheeze with this illness and she feels well. Mild hoarseness comes and goes. 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 Advair has maredly helped with asthma control. Used her rescue inhaler some during summer heat. Allergy shots do well and she feels they help.        Asthma History    Initial Asthma Severity Rating:    Age range: 12+ years    Symptoms: 0-2 days/week    Nighttime Awakenings: 0-2/month    Interferes w/ normal activity: no limitations    SABA use (not for EIB): 0-2 days/week    Asthma Severity Assessment: Intermittent   Preventive Screening-Counseling & Management  Alcohol-Tobacco     Smoking Status: current  Current Medications (verified): 1)  Advair Diskus 250-50 Mcg/dose Misc  (Fluticasone-Salmeterol) .[name].. 1 Puff Twice A Day 2)  Singulair 10 Mg  Tabs (Montelukast Sodium) .[name].. 1 By Mouth Qd 3)  Freestyle Lite   Strp (Glucose Blood) .... Use One Daily 4)  Freestyle Unistick Ii Lancets   Misc (Lancets) .... Use One Daily 5)  Metformin Hcl 500 Mg  Tb24 (Metformin Hcl) .[name].. 1  Two Times A Day With 2 Largest Meals 6)  Allergy Vaccine  1:10 7)  Epipen 0.3 Mg/0.21ml (1:1000)  Devi (Epinephrine Hcl (Anaphylaxis)) .... For Severe Allergic Reaction 8)  Proair Hfa 108 (90 Base) Mcg/act  Aers (Albuterol Sulfate) .... 2 Puffs Four Times A Day As Needed Rescue 9)  Prilosec 20 Mg  Cpdr (Omeprazole) .... As Needed 10)  Nasonex 50 Mcg/act  Susp (Mometasone Furoate) .[name].. 1-2 Sprays Each Nostril Daily 11)  Astepro 0.15 % Soln (Azelastine Hcl) .[name].. 1-2 Sprays Two Times A Day As Needed  Allergies (verified): No Known Drug Allergies  Past History:  Past Medical History: Last updated: 09/16/2009 Asthma Hyperlipidemia PTE/ DVT post op cervical spine surgery 2006 AODM (ICD-250.00) ELEVATED BLOOD PRESSURE WITHOUT DIAGNOSIS OF HYPERTENSION (ICD-796.2)(HTN @ stress test 2004) Remote granulomatous disease Allergic rhinitis Porokeratosis, Dr Karlyn Agee   Past Surgical History: Last updated: 09/16/2009 bunionectomy Tonsillectomy septoplasty tubal ligation  gravid 2 para 2 epidual/steroids 2006 cervical disc fusion, Dr Venora Maples post op, cns leak 09/2004, complicated by left hemiplegia temporarily post op & urinary retention PE/DVT  6/06 post op cervical fusions   colonoscopy 11/2005, Dr Loreta Ave, due 2012 Rotator cuff repair R shoulder 2009  Family History: Last updated: 09/16/2009 maternal grandmother cva paternal aunt mini stroke Mother-deceased age 63; cns  cancer; allergies Father-deceased age 28; emphysema,allergies Sister-  DM, heart disease/ stents,fibromyalgia, thyroid disease Sister- arthritis, Malachi Carl  Social History: Last updated: 09/16/2009 Never  Smoked Alcohol use-yes socially No diet Divorced  Retired Regular exercise-yes: treadmill, Nautilus, elliptical  Risk Factors: Exercise: yes (09/16/2009)  Risk Factors: Smoking Status: current (05/10/2010)  Social History: Smoking Status:  current  Review of Systems      See HPI  The patient denies shortness of breath with activity, shortness of breath at rest, productive cough, non-productive cough, coughing up blood, chest pain, irregular heartbeats, acid heartburn, indigestion, loss of appetite, weight change, abdominal pain, difficulty swallowing, sore throat, tooth/dental problems, headaches, nasal congestion/difficulty breathing through nose, sneezing, itching, ear ache, rash, change in color of mucus, and fever.    Vital Signs:  Patient profile:   70 year old female Height:      63.75 inches Weight:      142.38 pounds BMI:     24.72 O2 Sat:      100 % on Room air Pulse rate:   69 / minute BP sitting:   122 / 68  (left arm) Cuff size:   regular  Vitals Entered By: Reynaldo Minium CMA (May 10, 2010 9:17 AM)  O2 Flow:  Room air  Physical Exam  Additional Exam:  General: A/Ox3; pleasant and cooperative, NAD, SKIN: no rash, lesions NODES: no lymphadenopathy HEENT: [place]/AT, EOM- WNL, Conjuctivae- clear, PERRLA, TM-WNL, Nose- nasal, Throat- minimal thrush, Mallampati III, no stridor, no drip NECK: Supple w/ fair ROM, JVD- none, normal carotid impulses w/o bruits Thyroid-  CHEST: bilateral wheeze and dry cough HEART: RRR, no m/g/r heard ABDOMEN:  EAV:WUJW, nl pulses, no edema  NEURO: Grossly intact to observation- moves well bilaterally      Impression & Recommendations:  Problem # 1:  ALLERGIC RHINITIS (ICD-477.9)  She feels allergy vaccine continues to help her. We discussed EpiPen again. Singulair helps her and she ask for prior auth to continue.  The following medications were removed from the medication list:    Claritin 10 Mg Tabs (Loratadine) .....  Once daily Her updated medication list for this problem includes:    Nasonex 50 Mcg/act Susp (Mometasone furoate) .[name]... 1-2 sprays each nostril daily    Astepro 0.15 % Soln (Azelastine hcl) .[name]... 1-2 sprays two times a day as needed  Problem # 2:  ASTHMA (ICD-493.90) Mild intermittent asthma with good control. We did discuss med choices and also her hx of PE. We discussed options and CXR but with no compelling reason or  need to change now.   Medications Added to Medication List This Visit: 1)  Allergy Vaccine 1:10 Go   Other Orders: Est. Patient Level IV (11914)  Patient Instructions: 1)  Please schedule a follow-up appointment in 1 year. 2)  Scripts refilled Prescriptions: ASTEPRO 0.15 % SOLN (AZELASTINE HCL) 1-2 sprays two times a day as needed  #1 x prn   Entered and Authorized by:   Waymon Budge MD   Signed by:   Waymon Budge MD on 05/10/2010   Method used:   Print then Give to Patient   RxID:   [id number] NASONEX 50 MCG/ACT  SUSP (MOMETASONE FUROATE) 1-2 sprays each nostril daily  #3 x 3   Entered and Authorized by:   Waymon Budge MD   Signed by:   Waymon Budge MD on 05/10/2010  Method used:   Print then Give to Patient   RxID:   [id number] PROAIR HFA 108 (90 BASE) MCG/ACT  AERS (ALBUTEROL SULFATE) 2 puffs four times a day as needed rescue  #3 x 3   Entered and Authorized by:   Waymon Budge MD   Signed by:   Waymon Budge MD on 05/10/2010   Method used:   Print then Give to Patient   RxID:   [id number] ADVAIR DISKUS 250-50 MCG/DOSE MISC (FLUTICASONE-SALMETEROL) 1 puff twice a day  #3 x 3   Entered and Authorized by:   Waymon Budge MD   Signed by:   Waymon Budge MD on 05/10/2010   Method used:   Print then Give to Patient   RxID:   [id number] SINGULAIR 10 MG  TABS (MONTELUKAST SODIUM) 1 by mouth qd  #90 x 3   Entered and Authorized by:   Waymon Budge MD   Signed by:   Waymon Budge MD on 05/10/2010   Method used:    Print then Give to Patient   RxID:   [id number] ASTEPRO 0.15 % SOLN (AZELASTINE HCL) 1-2 sprays two times a day as needed  #1 x prn   Entered and Authorized by:   Waymon Budge MD   Signed by:   Waymon Budge MD on 05/10/2010   Method used:   Electronically to        [facility] (retail)       [address]       [place], [place]  [address]       Ph: [phone]       Fax: [phone]   RxID:   [id number] NASONEX 50 MCG/ACT  SUSP (MOMETASONE FUROATE) 1-2 sprays each nostril daily  #3 x 3   Entered and Authorized by:   Waymon Budge MD   Signed by:   Waymon Budge MD on 05/10/2010   Method used:   Electronically to        [facility] (retail)       [address]       [place], [place]  [address]       Ph: [phone]       Fax: [phone]   RxID:   [id number] PROAIR HFA 108 (90 BASE) MCG/ACT  AERS (ALBUTEROL SULFATE) 2 puffs four times a day as needed rescue  #3 x 3   Entered and Authorized by:   Waymon Budge MD   Signed by:   Waymon Budge MD on 05/10/2010   Method used:   Electronically to        [facility] (retail)       [address]       [place], [place]  [address]       Ph: [phone]       Fax: [phone]   RxID:   [id number] SINGULAIR 10 MG  TABS (MONTELUKAST SODIUM) 1 by mouth qd  #90 x 3   Entered and Authorized by:   Waymon Budge MD   Signed by:   Waymon Budge MD on 05/10/2010   Method used:   Electronically to        [facility] (retail)       [address]       [place], [place]  [address]       Ph: [phone]  Fax: [phone]   RxID:   [id number] ADVAIR DISKUS 250-50 MCG/DOSE MISC (FLUTICASONE-SALMETEROL) 1 puff twice a day  #3 x 3   Entered and Authorized by:   Waymon Budge MD   Signed by:   Waymon Budge MD on 05/10/2010   Method used:   Electronically to        [facility] (retail)       [address]       [place], [place]  [address]       Ph: [phone]       Fax: [phone]   RxID:   [id number]

## 2010-07-01 NOTE — Progress Notes (Signed)
Summary: refill  Phone Note Refill Request Message from:  Fax from Pharmacy on May 18, 2010 11:22 AM  Refills Requested: Medication #1:  METFORMIN HCL 500 MG  TB24 1  two times a day with 2 largest meals [facility] - fax [phone]  Initial call taken by: Okey Regal [place],  May 18, 2010 11:23 AM    Prescriptions: METFORMIN HCL 500 MG  TB24 (METFORMIN HCL) 1  two times a day with 2 largest meals  #180 Tablet x 1   Entered by:   Shonna Chock CMA   Authorized by:   Marga Melnick MD   Signed by:   Shonna Chock CMA on 05/18/2010   Method used:   Electronically to        [facility] (retail)       [address]       [place], [place]  [address]       Ph: [phone]       Fax: [phone]   RxID:   [id number]

## 2010-07-01 NOTE — Progress Notes (Signed)
Summary: wheezing . OV sheduled for 06/15/10  Phone Note Call from Patient Call back at Home Phone [phone]   Caller: Patient Call For: [name] Summary of Call: pt was seen at urgent care sat. c/o wheezing/ coughing. was given prednisone taper and rescue inhaler. pt feel worse today and requests to be seen.  Initial call taken by: Tivis Ringer, CNA,  June 14, 2010 9:11 AM  Follow-up for Phone Call        Called, spoke with pt.  States she started a new job last week and now has an asthma flare.  Was seen at Urgent Care on Saturday - given prednisone and told to use albuterol hfa qid.  Also using ibuprofen as needed.  States she felt worse yesterday than on Saturday.  Still having wheezng, prod cough with small amount of yellow mucus, and increased SOB with exertion.  Denies fever but did feel "feverish" yesterday.  Requesting to be seen today.  [facility] Last OV 12.12.11  Dr. Maple Hudson, pls advise.  Thanks! Follow-up by: Gweneth Dimitri RN,  June 14, 2010 10:29 AM  Additional Follow-up for Phone Call Additional follow up Details #1::        Please have patient come in on Tuesday 06-15-10 1115 for 1130 am workin slot. Please let patient know that the may be a wait as she is being worked in to see the provider.   Also, is the prednisone starting to help? We can add Zpak # 1 take as directed  and Hydromet cough syrup #262ml 1 tsp every 6 hours as needed if needed (ask pt if she would like these rx's). NO REFILLS ON EITHER RX. Additional Follow-up by: Reynaldo Minium CMA,  June 14, 2010 11:59 AM    Additional Follow-up for Phone Call Additional follow up Details #2::    Called, spoke with pt.  States prednsione does seem to be helping some today.  Advised of above per CY -- arrive tomorrow at 11:15 for work in at 11:30 but there may be a wait as she is being worked in.  Pt aware of this and verbalized understanding  Pt states she has picked up some OTC meds -- requesting  to hold off on zpak and hydromet at this time until seen by provider.   Follow-up by: Gweneth Dimitri RN,  June 14, 2010 12:09 PM

## 2010-07-01 NOTE — Assessment & Plan Note (Signed)
Summary: astham flare / cj   Primary Provider/Referring Provider:  Alwyn Ren  CC:  Acute visit-asthma flare up-sore throat, cough(yellow brown in color), and increased wheezing x approx 1 week.[name]  History of Present Illness: November 09, 2009- Allergic rhinitis, asthma Had flare of nasal congestion and drainage  a few weeks ago. Given amoxacillin incidentally from her orthopedist. Used Neti pot, bed rest x 2 days. She assumed viral- no energy. She thought she was well, but starting lat week she has relapsed with nasal congestion, drainage, yellow discharge. Denies fever, earache, headache, deep green r brown. A little chest congestion. No GI upset. had variably used Advair 250 or 100. Wheeze started yesterday. She has been using Advair 250 in AM, 100 at night.  May 10, 2010- May 11, 2009- Allergic rhinitis, asthma 6 month follow up visit-allergy.  Sinus infection responded to a Z pak called in in November. Uses Neti pot and comments it seems to stop up her left ear. No wheeze with this illness and she feels well. Mild hoarseness comes and goes. 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 Advair has markedly helped with asthma control. Used her rescue inhaler some during summer heat. Allergy shots do well and she feels they help.   June 15, 2010-  Allergic rhinitis, asthma Nurse-CC: Acute visit-asthma flare up-sore throat,cough(yellow brown in color),increased wheezing x approx 1 week. Acute- Went to Urgent Care 3 days ago with wheeze. She was training for new job and didn't want to leave sooner, but wheeze started a week ago . Put on prednisone  taper- 4 more days. . Notes sore throat and cough becoming productive brown yellow. Today feeling worse.    Asthma History    Asthma Control Assessment:    Age range: 12+ years    Symptoms: 0-2 days/week    Nighttime Awakenings: 0-2/month    Interferes w/ normal activity: no limitations    SABA use (not for EIB): 0-2 days/week    Asthma  Control Assessment: Well Controlled   Preventive Screening-Counseling & Management  Alcohol-Tobacco     Alcohol type: socailly     Smoking Status: quit  Current Medications (verified): 1)  Advair Diskus 250-50 Mcg/dose Misc (Fluticasone-Salmeterol) .[name].. 1 Puff Twice A Day 2)  Singulair 10 Mg  Tabs (Montelukast Sodium) .[name].. 1 By Mouth Qd 3)  Freestyle Lite   Strp (Glucose Blood) .... Use One Daily 4)  Freestyle Unistick Ii Lancets   Misc (Lancets) .... Use One Daily 5)  Metformin Hcl 500 Mg  Tb24 (Metformin Hcl) .[name].. 1  Two Times A Day With 2 Largest Meals 6)  Allergy Vaccine  1:10 Go 7)  Epipen 0.3 Mg/0.32ml (1:1000)  Devi (Epinephrine Hcl (Anaphylaxis)) .... For Severe Allergic Reaction 8)  Proair Hfa 108 (90 Base) Mcg/act  Aers (Albuterol Sulfate) .... 2 Puffs Four Times A Day As Needed Rescue 9)  Prilosec 20 Mg  Cpdr (Omeprazole) .... As Needed 10)  Nasonex 50 Mcg/act  Susp (Mometasone Furoate) .[name].. 1-2 Sprays Each Nostril Daily 11)  Astepro 0.15 % Soln (Azelastine Hcl) .[name].. 1-2 Sprays Two Times A Day As Needed  Allergies (verified): No Known Drug Allergies  Past History:  Past Medical History: Last updated: 09/16/2009 Asthma Hyperlipidemia PTE/ DVT post op cervical spine surgery 2006 AODM (ICD-250.00) ELEVATED BLOOD PRESSURE WITHOUT DIAGNOSIS OF HYPERTENSION (ICD-796.2)(HTN @ stress test 2004) Remote granulomatous disease Allergic rhinitis Porokeratosis, Dr Karlyn Agee   Past Surgical History: Last updated: 09/16/2009 bunionectomy Tonsillectomy septoplasty tubal ligation  gravid 2  para 2 epidual/steroids 2006 cervical disc fusion, Dr Venora Maples post op, cns leak 09/2004, complicated by left hemiplegia temporarily post op & urinary retention PE/DVT  6/06 post op cervical fusions   colonoscopy 11/2005, Dr Loreta Ave, due 2012 Rotator cuff repair R shoulder 2009  Family History: Last updated: 09/16/2009 maternal grandmother cva paternal aunt mini  stroke Mother-deceased age 60; cns  cancer; allergies Father-deceased age 12; emphysema,allergies Sister-  DM, heart disease/ stents,fibromyalgia, thyroid disease Sister- arthritis, Malachi Carl  Social History: Last updated: 09/16/2009 Never Smoked Alcohol use-yes socially No diet Divorced  Retired Regular exercise-yes: treadmill, Nautilus, elliptical  Risk Factors: Exercise: yes (09/16/2009)  Risk Factors: Smoking Status: quit (06/15/2010)  Social History: Smoking Status:  quit  Review of Systems      See HPI       The patient complains of shortness of breath with activity, productive cough, and nasal congestion/difficulty breathing through nose.  The patient denies shortness of breath at rest, non-productive cough, coughing up blood, chest pain, irregular heartbeats, acid heartburn, indigestion, loss of appetite, weight change, abdominal pain, difficulty swallowing, sore throat, tooth/dental problems, headaches, and sneezing.    Vital Signs:  Patient profile:   70 year old female Height:      63.75 inches Weight:      145.25 pounds BMI:     25.22 O2 Sat:      96 % on Room air Temp:     97.4 degrees F oral Pulse rate:   83 / minute BP sitting:   134 / 80  (left arm) Cuff size:   regular  Vitals Entered By: Reynaldo Minium CMA (June 15, 2010 10:30 AM)  O2 Flow:  Room air CC: Acute visit-asthma flare up-sore throat,cough(yellow brown in color),increased wheezing x approx 1 week.   Physical Exam  Additional Exam:  General: A/Ox3; pleasant and cooperative, NAD, SKIN: no rash, lesions NODES: no lymphadenopathy HEENT: [place]/AT, EOM- WNL, Conjuctivae- clear, PERRLA, TM-WNL, Nose- nasal, Throat- minimal thrush, Mallampati III, no stridor, no drip NECK: Supple w/ fair ROM, JVD- none, normal carotid impulses w/o bruits Thyroid-  CHEST: coarse rhonchi, unlabored HEART: RRR, no m/g/r heard ABDOMEN: medium build ZOX:WRUE, nl pulses, no edema  NEURO: Grossly intact to  observation- moves well bilaterally      Impression & Recommendations:  Problem # 1:  UPPER RESPIRATORY INFECTION, ACUTE, WITH BRONCHITIS (ICD-465.9)  She is already on prednisone taper.  We discussed fluids and supportive care. Wil give neb for her signifcant bronchitis now, tessalon, and Z pak.  Her updated medication list for this problem includes:    Benzonatate 100 Mg Caps (Benzonatate) .[name]... 1-2 three times a day as needed cough  Problem # 2:  ASTHMA (ICD-493.90) Wheezing is controlled for now. We reviewed meds with attention to response options if current symptoms spread down her airway.   Medications Added to Medication List This Visit: 1)  Zithromax Z-pak 250 Mg Tabs (Azithromycin) .... 2 today then one daily 2)  Benzonatate 100 Mg Caps (Benzonatate) .[name].. 1-2 three times a day as needed cough  Other Orders: Nebulizer Tx (45409) Albuterol Sulfate Sol 1mg  unit dose (W1191) Est. Patient Level III (47829)  Patient Instructions: 1)  Keep scheduled appointment or call earlier as needed 2)  neb  a 3)  Scripts sent for Zpak and benzonatate perles for cough 4)  fluids and Mucinex may help loosen so you can cough it out 5)    Prescriptions: BENZONATATE 100 MG CAPS (BENZONATATE) 1-2 three times a day  as needed cough  #25 x 1   Entered and Authorized by:   Waymon Budge MD   Signed by:   Waymon Budge MD on 06/15/2010   Method used:   Electronically to        [facility] (retail)       [address]       [place], [place]  [address]       Ph: [phone]       Fax: [phone]   RxID:   [id number] ZITHROMAX Z-PAK 250 MG TABS (AZITHROMYCIN) 2 today then one daily  #1 pak x 0   Entered and Authorized by:   Waymon Budge MD   Signed by:   Waymon Budge MD on 06/15/2010   Method used:   Electronically to        [facility] (retail)       [address]       [place], [place]  [address]       Ph:  [phone]       Fax: [phone]   RxID:   [id number]      Medication Administration  Medication # 1:    Medication: Albuterol Sulfate Sol 1mg  unit dose    Diagnosis: UPPER RESPIRATORY INFECTION, ACUTE, WITH BRONCHITIS (ICD-465.9)    Dose: 1    Route: inhaled    Exp Date: 06/08/2011    Lot #: [id number]    Mfr: Nephron    Patient tolerated medication without complications    Given by: Abigail Miyamoto RN (June 15, 2010 11:22 AM)  Orders Added: 1)  Nebulizer Tx [37628] 2)  Albuterol Sulfate Sol 1mg  unit dose [J7613] 3)  Est. Patient Level III [31517]

## 2010-07-12 ENCOUNTER — Encounter: Payer: Self-pay | Admitting: Internal Medicine

## 2010-07-16 ENCOUNTER — Telehealth (INDEPENDENT_AMBULATORY_CARE_PROVIDER_SITE_OTHER): Payer: Self-pay | Admitting: *Deleted

## 2010-07-21 NOTE — Progress Notes (Signed)
Summary: magic mouthwash  Phone Note Call from Patient   Caller: Patient Call For: young Summary of Call: pt c/o haorsness since last asthma attack. requests rx for magic mouthwash- [facility]. pt # at work [phone] Initial call taken by: Tivis Ringer, CNA,  July 16, 2010 1:15 PM  Follow-up for Phone Call        called and spoke with pt and she stated that since the 2nd wk in feb when she had the asthma attack she has been using her rescue inhaler more that she normally does---c/o hoarseness now and pt is requesting that the MMW be filled for her.  please advise. thanks Randell Loop CMA  July 16, 2010 4:48 PM   Additional Follow-up for Phone Call Additional follow up Details #1::        Per CDY-okay to give [facility] MMW #163ml 5ml swish and swallow four times a day  no refills.Reynaldo Minium CMA  July 16, 2010 5:05 PM   med called to pharmacy and pt was at the pharmacy waiting on it so no need to call her at home Additional Follow-up by: Philipp Deputy CMA,  July 16, 2010 5:09 PM

## 2010-07-27 NOTE — Letter (Signed)
Summary: Diabetic Eye Exam/[facility]  Diabetic Eye Exam/[facility]   Imported By: Maryln Gottron 07/21/2010 10:19:39  _____________________________________________________________________  External Attachment:    Type:   Image     Comment:   External Document

## 2010-08-30 ENCOUNTER — Telehealth: Payer: Self-pay | Admitting: Internal Medicine

## 2010-08-30 MED ORDER — FLUTICASONE-SALMETEROL 250-50 MCG/DOSE IN AEPB: 1.0000 | INHALATION_SPRAY | Freq: Two times a day (BID) | RESPIRATORY_TRACT | Status: DC

## 2010-08-30 NOTE — Telephone Encounter (Signed)
lmomtcb x1 

## 2010-08-30 NOTE — Telephone Encounter (Signed)
Pt returned call from triage. [phone].

## 2010-08-30 NOTE — Telephone Encounter (Signed)
Pt needs Advair Diskus called to [facility] until her mail order supply is received from [facility]. RX has been sent.

## 2010-10-12 NOTE — Assessment & Plan Note (Signed)
[name]                             PULMONARY OFFICE NOTE   NAME:[name] [name] BOYDE                          MRN:          [id number]  DATE:11/24/2006                            DOB:          1940/11/25    HISTORY OF PRESENT ILLNESS:  Patient is a 70 year old white female  patient of Dr. Maple Hudson, who has a known history of asthma and allergic  rhinitis, and presents today for an acute office visit.  Patient  complains, over the last few days, that she has had increased minimally  productive cough, wheezing and shortness of breath.  Patient denies any  hemoptysis, orthopnea, PND or leg swelling.   PAST MEDICAL HISTORY:  Reviewed.   CURRENT MEDICATIONS:  Reviewed.   PHYSICAL EXAM:  The patient is a pleasant female, in no acute distress.  She is afebrile with stable vital signs.  Her O2 saturation is 99% on  room air.  HEENT:  Unremarkable.  NECK:  Supple without cervical adenopathy.  No JVD.  LUNG SOUNDS:  Clear without any wheezing or crackles.  CARDIAC:  Regular rate and rhythm.  ABDOMEN:  Soft and nontender.  EXTREMITIES:  Warm without any edema.   IMPRESSION AND PLAN:  Mild asthmatic flare.  Patient is to begin Mucinex-  DM twice daily.  Was given a Xopenex nebulizer treatment in the office  today.  Patient is to continue on her present regimen and follow back up  with Dr. Maple Hudson as scheduled, or sooner, if needed.      Rubye Oaks, NP  Electronically Signed      Clinton D. Maple Hudson, MD, Tonny Bollman, FACP  Electronically Signed   TP/MedQ  DD: 11/24/2006  DT: 11/24/2006  Job #: [id number]

## 2010-10-12 NOTE — Assessment & Plan Note (Signed)
[facility]                             PULMONARY OFFICE NOTE   NAME:[name], Quebedeaux                          MRN:          [id number]  DATE:04/04/2007                            DOB:          November 05, 1940    PROBLEM LIST:  1. Asthma.  2. Allergic rhinitis.  3. History of pulmonary embolism.   HISTORY:  She said she was doing very well through the summer until she  began wheezing this week. Has had flu vaccine. Wheezing is not bad, does  not keep her awake. She does not feel acutely ill. Denies sore throat,  fever, adenopathy. She snores some but does not think that has changed.  She has begun coughing a little yellow sputum. She has seen the nurse  practitioner in June for a flare of asthma.   MEDICATIONS:  1. Singulair 10 mg.  2. Clarinex.  3. Advair 250/50.  4. Flonase has been changed to Nasonex.  5. Allergy vaccine continues at 1:10.  6. Coumadin has been discontinued.  7. Flexeril.  8. Albuterol rescue inhaler and Prilosec are used p.r.n.  9. She does have an Epipen.   No medication allergy.   OBJECTIVE:  Weight 153 pounds, blood pressure 122/80, pulse 64, room air  saturation 100%.  Mild hoarseness, throat is not red, no evident nasal drainage. May be  mild turbinate edema. No adenopathy or stridor.  Chest sounds clear, she does not cough or wheeze.  Heart sounds are regular without murmur.   IMPRESSION:  1. Asthma, mild exacerbation, possibly viral triggered.  2. Rhinitis.   PLAN:  She will continue present medications. I refilled her Proventil  HFA inhaler and we discussed symptomatic management for a mild viral  syndrome. If it progresses and she needs more she will call back but  otherwise will schedule in 6 months, earlier p.r.n.     Clinton D. Maple Hudson, MD, Tonny Bollman, FACP  Electronically Signed    CDY/MedQ  DD: 04/04/2007  DT: 04/05/2007  Job #: [id number]   cc:   Titus Dubin. Alwyn Ren, MD,FACP,FCCP

## 2010-10-12 NOTE — Op Note (Signed)
NAMEJELENE, [name] NO.:  [id number]   MEDICAL RECORD NO.:  [id number]          PATIENT TYPE:  OIB   LOCATION:  5032                         FACILITY:  [facility]   PHYSICIAN:  Burnard Bunting, M.D.    DATE OF BIRTH:  December 24, 1940   DATE OF PROCEDURE:  10/16/2007  DATE OF DISCHARGE:                               OPERATIVE REPORT   PREOPERATIVE DIAGNOSES:  1. Right shoulder rotator cuff tear.  2. Biceps tendon tear.  3. Bursitis.   POSTOPERATIVE DIAGNOSES:  1. Right shoulder rotator cuff tear.  2. Biceps tendon tear.  3. Bursitis.   PROCEDURE:  1. Right shoulder diagnostic/operative arthroscopy with labral      debridement.  2. Rotator cuff debridement.  3. Biceps tenotomy.  4. Open biceps tenodesis.  5. Open rotator cuff repair.  6. Subacromial decompression.   SURGEON:  Burnard Bunting, MD.   ASSISTANTJerolyn Shin. Tresa Res, MD   ANESTHESIA:  General endotracheal.   ESTIMATED BLOOD LOSS:  Minimal.   INDICATIONS:  [name] is a 70 year old patient with right shoulder  pain and a large rotator cuff tear, who presents now for operative  management after explanation of risks, benefits, and failure of  conservative therapy.   OPERATIVE FINDINGS:  1. Examination under anesthesia, range of motion 0-180 with external      rotation to 50 degrees and abduction to 70 and passive glenohumeral      abduction is 110.  2. Diagnostic/operative arthroscopy.  3. Partial biceps tendon tearing approximately 50% with uncovering of      the biceps tendon from the rotator cuff dissolution/tear.  4. Intact glenohumeral articular surface.  5. Intact subscap.  6. Significant bursitis in the subacromial space.   PROCEDURE IN DETAIL:  The patient was brought to the operating room,  where general endotracheal anesthesia was induced.  Preoperative IV  antibiotics were administered.  The patient was placed in beach-chair  position with the head in neutral position.  Right arm,  shoulder, and  hand were prepped with DuraPrep solution and draped in sterile manner.  Collier Flowers was used to cover the axilla.  Time-out was called.  Solution of  saline and epinephrine was injected into the subacromial space.  Saline  was then injected into the glenohumeral joint.  Glenohumeral joint was  entered from the posterior portal 2 cm medioinferior to the  posterolateral margin of the acromion.  Diagnostic arthroscopy was  performed.  Labral tearing and biceps tearing were identified.  Anteroinferior and posteroinferior glenohumeral ligaments were intact.  Glenohumeral surfaces were intact.  Extensive debridement and release of  the biceps tendon was performed because of immobility of the cuff, which  was tested after creation of an anterior portal under direct  visualization.  Following extensive debridement and determination that  the cuff tear was only partially repairable with uncovering the biceps  tendon, the biceps tendon was cut and tenodesis was planned.  Subacromial decompression with preservation of the CA ligament was  performed.  At this time, debridement of the rotator cuff tendon was  also  performed.  At this time, shoulder was reprepped with DuraPrep, and  Ioban was used to cover the operative field.  An incision was made in  the anterolateral margin of the acromion.  Deltoid was split and  measured a distance of 4 cm from the anterolateral margin of the  acromion and tagged with a stay suture.  Arthrex retractor was placed.  Rotator cuff tear was identified.  Continued efforts at mobilization  were not successful.  In general, the patient's rotator cuff attachment  was intact on the footprint, but there was absence of the supraspinatus  tendon over the humeral head.  A suture anchor was placed near the  supraspinatus, where tendinopathy and partial tearing was visualized.  The suture anchor with the ends were then tacked down with a push lock.  Through the same  incision, the transverse humeral ligament was incised  along its medial side.  Biceps tendon was identified.  FiberWire suture  loop was then placed, and the biceps tendon was tenodesed in the  bicipital groove using 7-mm Bio-Tenodesis screw.  At this time, the arm  was taken through range of motion.  Appropriate tension was noted in the  biceps tendon.  The incision was then thoroughly irrigated.  Adequate  decompression was confirmed manually.  Deltoid split was then closed  using #1 Vicryl suture, followed by interrupted inverted 2-0 Vicryl  suture, and 3-0 pullout Prolene.  Portals were closed using 3-0 nylon  prior to the open portion of the procedure.  The patient tolerated the  procedure well without immediate complications.  Bulky dressing and  immobilizer were applied.  Dr. Lenny Pastel assistance was required at all  times during the case for retraction of important neurovascular  structures and arm positioning and assistance was a medical necessity.      Burnard Bunting, M.D.  Electronically Signed     GSD/MEDQ  D:  10/16/2007  T:  10/17/2007  Job:  [id number]

## 2010-10-15 NOTE — Op Note (Signed)
NAMESALLEY, BOXLEY NO.:  [id number]   MEDICAL RECORD NO.:  [id number]          PATIENT TYPE:  OIB   LOCATION:  3103                         FACILITY:  [facility]   PHYSICIAN:  Kathaleen Maser. Pool, M.D.    DATE OF BIRTH:  [birth year]   DATE OF PROCEDURE:  10/12/2004  DATE OF DISCHARGE:                                 OPERATIVE REPORT   PREOPERATIVE DIAGNOSIS:  Postoperative subdural hematoma with myelography.   POSTOPERATIVE DIAGNOSIS:  Postoperative subdural hematoma with myelography.   OPERATION/PROCEDURE:  1.  Exploration of C5-6 and C6-7 anterior cervical fusion.  2.  Removal of anterior plate instrumentation.  3.  Evacuation of postoperative subdural hematoma, spinal, microdissection.  4.  Revision of anterior cervical fusion and plating.   SURGEON:  Kathaleen Maser. Pool, M.D.   ASSISTANT:  Tia Alert, MD   ANESTHESIA:  General endotracheal anesthesia.   INDICATIONS:  [name] is a 70 year old female who underwent a C5-6 and C6-  7 anterior cervical diskectomy and fusion with allograft and anterior  plating yesterday.  Initially the patient awakened from surgery with an  intact neurological exam and no pain.  The patient deteriorated in the  recovery room and began having significant left upper and left lower  extremity weakness.  CT scanning demonstrated evidence of a postoperative  epidural hematoma.  The patient was rushed back to the operating room and  what appeared to be a significant epidural hematoma was evacuated.  The  fusion was revised and the patient was taken back to the recovery room.  Additionally postoperatively the patient once again was quite improved.  She  had much better strength in her left upper extremity.  Her triceps muscle  group was rated at a 4/5.  Her grips and intrinsics of her left hand were  4/5.  Her left lower extremity strength was essentially intact.  Her right-  sided strength was normal.  She had no pain.  Gradually through  the course  of the night, the patient lost motor function in her left hand once again.  Her intrinsic function was traced to weak contraction.  She 1-2/5 strength  in her left triceps.  Her left lower extremity was 4/5.  An emergent MRI  scan was obtained.  The MRI scan demonstrated an unusual appearance which  was not consistent with a postoperative epidural hematoma although there did  appear to be something still compressing the cord on the left-hand side.  To  me it appeared that the patient did have evidence of a subdural blood clot  compressing the spinal cord.  I discussed the situation with the patient and  her family.  I recommended that we return to the operating room for  exploration of her wound and evacuation of the subdural hematoma.  The  patient now presents to the operating room for reexploration and evacuation  of the presumed subdural hematoma.   DESCRIPTION OF PROCEDURE:  The patient was taken to the operating room and  placed on the operating table in the supine position.  After adequate level  of anesthesia was achieved, the patient was positioned with the neck  slightly extended and held in place with halter traction.  The patient's  anterior cervical region was prepped and draped sterilely.  Her skin  incision was reopened as was her platysma incision.  Dissection was then  made along the medial border of the sternocleidomastoid muscle and carotid  sheath.  Trachea and esophagus were mobilized towards the left.  Deep self-  retaining retractor was placed.  The anterior plate instrumentation at C5-6  and C6-7 was removed.  The bone graft at C5-6 was removed.  Microscope was  brought into the field for microdissection.  There was no significant  evidence of epidural blood clot.  The dura itself was bluish and somewhat  bulbus.  No definite clot could be visualized.  The dura was elevated with a  micro nerve hook and cut in layers using an 11 blade.  The arachnoid  was  identified as was some organized blood clot.  This blood clot was tracked  into the left ventral nerve root take-off region.  A significant subdural  clot was encountered and dissected free using microdissection technique and  micro nerve hooks and micro pituitaries.  All clot that could be visualized  was removed.  Blunt probe was passed both superiorly and inferiorly without  evidence of any further compressive clots.   At this point the cord resumed normal pulsations and normal position without  evidence of any compression.  There was no evidence of contusion or  abnormality to the cord itself.  The wound was irrigated with antibiotic  saline.  Duragen was then placed over the dural opening.  Gelfoam was placed  over this.  Hemostasis was good.  No attempt was made to primarily re close  the dura as this was technically unfeasible.  The 6 mm allograft wedge was  then impacted  in place and set at approximately 1 mm from the anterior  cortical surface.  The 40 mm Atlantis anterior cervical plate was then  replaced.  It was reattached using 13 mm variable angle screws. All six  screws were given a final tightening and found to be well engaged.  Locking  screws were engaged at all three levels.  Wound was then irrigated with  antibiotic solution.  It was then closed in the typical fashion.  Steri-  Strips and sterile dressing were applied.  There are no apparent  complications.  The patient returned to the recovery room in good condition.      HAP/MEDQ  D:  10/13/2004  T:  10/14/2004  Job:  [id number]

## 2010-10-15 NOTE — Op Note (Signed)
[name], [name]                   ACCOUNT NO.:  [id number]   MEDICAL RECORD NO.:  [id number]          PATIENT TYPE:  OIB   LOCATION:  2855                         FACILITY:  [facility]   PHYSICIAN:  Henry A. Pool, M.D.    DATE OF BIRTH:  Jan 09, 1941   DATE OF PROCEDURE:  10/12/2004  DATE OF DISCHARGE:                                 OPERATIVE REPORT   PREOPERATIVE DIAGNOSIS:  Status post cervical 5-6 and cervical 6-7 anterior  cervical diskectomy and fusion with allograft anterior plating, with  postoperative epidural hematoma and myelopathy.   POSTOPERATIVE DIAGNOSIS:  Status post cervical 5-6 and cervical 6-7 anterior  cervical diskectomy and fusion with allograft and anterior plating, with  postoperative epidural hematoma and myelopathy.   OPERATION/PROCEDURE:  1.  Reexploration of anterior cervical fusion with,  2.  Removal of hardware; and,  3.  Fusion.  4.  Evacuation of postoperative epidural hematoma.  5.  Revision of anterior cervical fusion, cervical 5-6 and cervical 6-7 with      instrumentation.   SURGEON:  Kathaleen Maser. Pool, M.D.   ASSISTANT:  Donalee Citrin, M.D.   ANESTHESIA:  General orotracheal.   INDICATIONS:  [name] is a 70 year old female who recently underwent a C5-  6 and C6-7 anterior cervical diskectomy and fusion with allograft anterior  plating.  Initially in the recovery room the patient ws wide awake, moving  both upper and lower extremities equally with normal motor strength.  Over  the next hour the patient progressively became weaker in her left upper and  lower extremities.  An emergence CT scan of the patient's cervical spine was  obtained.  This demonstrated evidence of a postoperative epidural hematoma,  worse on the left side, at the C5-6 level tracking down to the C6-7 level as  well.  I discussed the situation with the patient and her daughter.  I  recommended emergently going back to the operating room and evacuating the  epidural hematoma.  The  patient was aware of the risks and benefits of  surgery, and wishes to proceed.   DESCRIPTION OF OPERATION:  The patient was brought to the operating room and  placed on the operating table in a supine position.  After an adequate level  of anesthesia was achieved the patient was supine with the neck slightly  extended and held in place with Halter traction.  The patient's anterior  cervical region was prepped and draped sterilely.   A 10 blade was used to make a skin incision through the old incision.  The  sutures within the platysma were cut.  Dissection then proceeded along the  medial border of the sternocleidomastoid muscle and the carotid sheath.  Trachea and esophagus were mobilized and retracted towards the left.  The  patient's anterior plate instrumentation was removed.  The bone grafts at C5  and C6 were removed.  Relatively brisk venous epidural bleeding was  encountered at both the C5-6 and C6-7 disk spaces.  This was evacuated.   The microscope as brought into the field.  There were some epidural  veins on  the right side along the C7 nerve root, which were bleeding actively.  These  were controlled with bipolar electrocautery.  Other bleeding points were  controlled with gentle tamponade with Gelfoam and cold saline irrigation.  Eventually the bleeding ceased completely.  The thecal sac and nerve roots  were inspected. There was no evidence of compression.  Blunt probes were  passed both superiorly and inferiorly without evidence of any further  compression.  The bone grafts at C5-6 and C6-7 were then replaced. The plate  and screw instrumentations were replaced at C5, C6 and C7.   The wound was then irrigated with antibiotic solution.  It was then closed  in typical fashion.  Prior to closing hemostasis was checked along the way  and there was no evidence of any active bleeding.   The patient tolerated the procedure well and returned to the recovery room   postoperatively.      HAP/MEDQ  D:  10/12/2004  T:  10/12/2004  Job:  [id number]

## 2010-10-15 NOTE — Assessment & Plan Note (Signed)
[facility]                               PULMONARY OFFICE NOTE   NAME:[name], [name]                          MRN:          [id number]  DATE:03/16/2006                            DOB:          1940/10/01    PULMONARY/ALLERGY FOLLOWUP   PROBLEMS:  1. Asthma.  2. Allergic rhinitis.  3. History of pulmonary embolism/Coumadin.   CARDIOVASCULAR PHYSICIAN:  Dr. Randa Evens.   HISTORY:  She has had pneumococcal vaccine.  She has noted hoarseness for 2  days but says it has been like that off and on all summer.  Chest has been a  little tight, just a feeling that she cannot take a full comfortable breath,  but nothing very specific.  She does not really notice wheeze, cough, pain  or palpitation.  She would like to change from generic fluticasone to  Nasonex and asks a refill on Flexeril while she is here.  She continues  allergy vaccine at 1:10 giving her own injections.  We took time today to  review risks, benefits and goals of allergy vaccine as a therapy including  discussion of administration outside medical office, anaphylaxis and  epinephrine.  She has an Epi pen which we refilled and discussed.   MEDICATIONS:  1. Singulair 10 mg.  2. Clarinex.  3. Advair 250/50.  4. Nasal steroid once each nostril daily.  5. Allergy vaccine.  6. She is off Coumadin.  7. Flexeril 10 mg q.h.s.  8. Prilosec.  9. Albuterol rescue inhaler.   No medication allergy.   OBJECTIVE:  VITAL SIGNS:  Weight 150 pounds.  BP 116/62.  Pulse regular at  65.  Room air saturation 98%.  HEART:  Heart sounds are regular without murmur, rub or gallop.  There is  mild hoarseness and some white postnasal drainage with no pharyngeal  erythema, no stridor.  CHEST:  Quiet chest with a little dry cough but no wheeze or dullness.  EXTREMITIES:  There is no peripheral edema.   IMPRESSION:  1. Exacerbation of rhinitis with postnasal drainage.  2. Mild exacerbation of asthma.   Stable after history of pulmonary      embolism, followed by Dr. Samule Ohm.  She says she is off Coumadin.  3. Flexeril is used for sleep.   PLAN:  1. Allergy vaccination risk discussion as above.  2. Nasonex 1 spray each nostril daily.  3. Try sample of Xyzal as an antihistamine at 5 mg daily.  4. Nebulizer treatment now with Xopenex 1.25 mg.  5. Refilled Flexeril 10 mg, #50 written as 1 t.i.d. p.r.n. although she      only uses it p.r.n. at night.  6. Schedule to return in 6 months for vaccine followup and earlier p.r.n.       Clinton D. Maple Hudson, MD, FCCP, FACP      CDY/MedQ  DD:  03/18/2006  DT:  03/20/2006  Job #:  [id number]   cc:   Titus Dubin. Alwyn Ren, MD,FACP,FCCP

## 2010-10-15 NOTE — H&P (Signed)
NAMEIZELLA, YBANEZ NO.:  [id number]   MEDICAL RECORD NO.:  [id number]          PATIENT TYPE:  INP   LOCATION:  2041                         FACILITY:  [facility]   PHYSICIAN:  Titus Dubin. Alwyn Ren, M.D. Romualdo Bolk OF BIRTH:  [birth year]   DATE OF ADMISSION:  11/12/2004  DATE OF DISCHARGE:                                HISTORY & PHYSICAL   [name] was seen in the office on November 12, 2004 with left flank pain and  inferior thoracic pain which began November 11, 2004.  She was having severe  pain with movement or respirations.  She also had associated nausea and  vomiting once on November 11, 2004 and once on November 12, 2004.   The pain was described at the waistline and left flank.  She denied any  fever or chills but described feeling hot and cold.  Although she has a  history of asthma it been essentially stable.  She specifically denied cough  to any significant degree or any sputum production.   Her chest x-ray revealed atelectasis or infiltrate in the left lower lobe  with minimal blunting of the right costophrenic angle.  Because of her  recent surgery and sedentary lifestyle, she was admitted because of the  possibility of pulmonary thromboembolism.   Specifically, she underwent cervical disk fusion on Oct 11, 2004.  She had a  postop hematoma which required evacuation.  On Oct 12, 2004 the hematoma had  to be surgically addressed again.  Surgery was also complicated by a CNS  leak.  The re-exploration was apparently for partial hardware and epidural  hematoma.  Hospitalization was complicated by urinary retention and also  hypertension.  The surgery was required because of the cervical stenosis of  C5-C6, C6-C7.   Other past history includes tonsillectomy at age 26; bunionectomy;  septoplasty; and hemorrhoids and colonoscopy in 2001.  She is gravida 2,  para 2.  She had epidural steroids prior to the surgery.  Chest x-ray has  suggested remote granulomatous changes.   She has dyslipidemia with an elevated total cholesterol and LDL but an  elevated HDL.  She has also had an elevated C-reactive protein with normal  homocysteine.  In 2004 she was found to have a hypertensive response to a  stress test.   Her father had emphysema; mother had CNS cancer; her twin has had diabetes,  fibromyalgia and had a stent placed in September 2004.  Her sister has  fibromyalgia.  Maternal grandmother had stroke.  Maternal aunt had mini  strokes.   She never smoked and drinks socially.  She has allergy to [facility] and is  intolerant to VIOXX which caused GI symptoms.   She is presently on Singulair 10 mg daily, Flonase intranasally, Prempro  1.5, Advair 250/50 one inhalation every 12 hours, Clarinex 5 mg daily,  allergy injections weekly, hydrocodone as needed, Flexeril and Prilosec.   Review of systems reveals no recent asthma flares.  She was seen for  symptoms of urinary tract infection on June 6.  Urine revealed large amount  of white  cells.  She had over 100,000 Citrobacter freundii sensitive to  Cipro.   She has had postop headache for a week now.  She has also had some blurred  vision but no diplopia.   On exam she appears somewhat chronically ill.  Weight was 137-1/2 down from  144 on June 6.  Temperature was 98.2, pulse 92 and blood pressure 130/84.  Ophthalmology exam and ENT were normal except for a slight ptosis of the  left eye, nares and oropharynx were slightly dry.  She had no  lymphadenopathy about the head or neck or axilla.  Breath sounds were  decreased with splinting on the left.  S4 was noted with grade 1/2 flow  murmur.  Abdomen was nontender.  She experienced pain lying down and sitting  up or on any other movement in the left inferior thoracic area.  There was  some tenderness of the left upper quadrant and left lower quadrant but no  masses or organomegaly.  Genitourinary exam were deferred as they are not  germain to this admission.   She had no definite localizing neuropsychiatric  deficits except for the generalized weakness.  She required help to sit up  and move to and from exam table.   Chest x-ray findings are described as above.   I explained to [name] and her daughter that my major concern was  pulmonary thromboembolism, that she had no signs to suggest pneumonia .  I  told them that she would be admitted to telemetry with a stat CT scan with  heparin protocol as per pharmacy.  Pharmacy was notified that she had the  disk surgery and hematoma removal and CNS leak.  Dr. Dutch Quint 's office will  also be notified.  She will be given Zofran IV for the nausea.  The  hydrocodone will be continued and supplemented with IV morphine as needed.       WFH/MEDQ  D:  11/13/2004  T:  11/13/2004  Job:  [id number]   cc:   Henry A. Pool, M.D.  [address]  [place]  [place] [address]  Fax: [phone]

## 2010-10-15 NOTE — Op Note (Signed)
[name], [name]                   ACCOUNT NO.:  [id number]   MEDICAL RECORD NO.:  [id number]          PATIENT TYPE:  OIB   LOCATION:  2855                         FACILITY:  [facility]   PHYSICIAN:  Henry A. Pool, M.D.    DATE OF BIRTH:  [birth year]   DATE OF PROCEDURE:  10/12/2004  DATE OF DISCHARGE:                                 OPERATIVE REPORT   PREOPERATIVE DIAGNOSES:   POSTOPERATIVE DIAGNOSES:   OPERATION PERFORMED:  C5-6, C6-7 anterior cervical diskectomy and fusion  with allograft and anterior plating.   SURGEON:  Kathaleen Maser. Pool, M.D.   ASSISTANT:  Reinaldo Meeker, M.D.   ANESTHESIA:  General endotracheal.   INDICATIONS FOR PROCEDURE:  Ms. Brossman is a 70 year old female with history  of neck pain with radiation to both proximal upper extremities.  The patient  has failed conservative management.  Work-up demonstrates evidence of  extreme degenerative disk disease with stenosis at C5-6 and C6-7. The  patient has been counseled as to her options.  She decided to proceed with a  C5-6 and C6-7 anterior cervical diskectomy and fusion with allograft and  anterior plating.   DESCRIPTION OF PROCEDURE:  The patient was taken to the operating room and  placed on the table in supine position.  After adequate level of general  anesthesia was achieved, the patient was positioned supine with the neck  slightly extended and held in place with halter traction.  The patient's  anterior cervical region was prepped and draped sterilely.  A 10 blade was  used to make a linear skin incision overlying the C6 vertebral level.  This  was carried down sharply to the platysma.  The platysma was then divided  vertically and dissection proceeded along the medial border of the  sternocleidomastoid muscle and carotid sheath.  Trachea and esophagus were  mobilized and retracted toward the left.  Prevertebral fascia was stripped  off the anterior spinal column.  The longus colli muscles were then  elevated  bilaterally using electrocautery.  Deep self-retaining retractor was placed.  Intraoperative fluoroscopy was used and the C5-6 and C6-7 levels were  confirmed.  Anterior osteophytes were removed using the Leksell rongeurs.  The disk spaces at both levels were then incised with a 15 blade in  rectangular fashion.  A wide disk space cleanout was then achieved using  pituitary rongeurs, forward and backward angled Carlens curets, Kerrison  rongeurs and a high speed drill.  All elements of the disk were removed down  to the posterior annulus at both levels.  Microscope was brought into the  field and used throughout the remainder of the diskectomy.  Remaining  aspects of the annuls and osteophytes were removed using a high speed drill  down to the level of the posterior longitudinal ligament.  The posterior  longitudinal ligament was then elevated and resected in piecemeal fashion  using Kerrison rongeurs.  The underlying thecal sac was identified.  A wide  central decompression was then performed by undercutting the bodies of  C5  and C6.  Decompression proceeded out to  each neural foramen.  Wide anterior  foraminotomies were performed along the course of exiting nerve roots  bilaterally.  At this point a very thorough decompression had been achieved.  There was no evidence of injury to thecal sac or nerve roots.  The wound was  then irrigated with antibiotic solution.  Attention was then turned to the  C6-7 level which was subsequently decompressed under a similar fashion to C5-  6, again without complication.  Gelfoam was then placed topically at C6-7 as  it was at C5-6.  A 5 mm LifeNet allograft wedge was then impacted into  placed, recessed approximately 1 mm from the anterior cortical margin at C6-  7 and a 6 mm wedge was used at C5-6 where it was also impacted into placed  and recessed approximately 1 mm from the anterior cortical margin.  A 37 mm  Atlantis anterior cervical  plate was then placed to the C5, C6 and C7  levels.  This was then attached under fluoroscopic guidance using 13 mm  variable angle screws, two each at all three levels.  Final tightening was  given to all six screws.  Locking screws were engaged at all three levels.  Final images revealed good position of bone grafts and hardware at the  proper operative level with normal alignment of the spine.  The wound was  then irrigated with antibiotic solution.  Gelfoam was placed topically  temporarily for hemostasis, which was found to be good.  The wound was then  closed in typical fashion.  Steri-Strips and sterile dressing were applied.  There were no apparent complications.  The patient tolerated the procedure  well and she returned to the recovery room postoperatively.      HAP/MEDQ  D:  10/12/2004  T:  10/12/2004  Job:  [id number]

## 2010-10-15 NOTE — Discharge Summary (Signed)
NAMEMADISSON, KULAGA NO.:  [id number]   MEDICAL RECORD NO.:  [id number]          PATIENT TYPE:  IPS   LOCATION:  4038                         FACILITY:  [facility]   PHYSICIAN:  Ellwood Dense, M.D.   DATE OF BIRTH:  Jan 10, 1941   DATE OF ADMISSION:  10/21/2004  DATE OF DISCHARGE:  10/28/2004                                 DISCHARGE SUMMARY   DISCHARGE DIAGNOSES:  1. Anterior cervical disc fusion with re-exploration for partial hardware      and epidural hematoma removed Oct 13, 2004  2. Cerebrospinal fluid leak with lumbar drainage Oct 15, 2004.  3. Pain management.  4. Chronic obstructive pulmonary disease.  5. Urinary retention, resolved.  6. Hypertension.     HISTORY OF PRESENT ILLNESS:  This is a 70 year old white female admitted Oct 12, 2004 with increased neck pain.  X-rays and imaging studies showed  cervical C5-C6, C6-C7 stenosis and spondylosis.  Patient underwent cervical  C5-C6, C6-C7 ACD and fusion per Dr. Jordan Likes on Oct 12, 2004.  Postoperative  progressive weakness upper extremities. Emergent CT scan showed a  postoperative epidural hematoma.  Patient underwent re-exploration of ACDF  with removal with partial hardware, evacuation of hematoma and revision of  ACDF Oct 13, 2004. Found to have cerebrospinal fluid leak after follow up  MRI and underwent placement of lumbar drain Oct 15, 2004 per Dr. Benard Rink.  Patient remained on Decadron taper.  Still with some urinary retention  maintained on Urecholine 25 mg three times daily.  Blood pressures monitored  with some increased variables.  Recently placed on a clonidine patch.  She  was admitted for a comprehensive rehabilitation program.   PAST MEDICAL HISTORY:  See discharge diagnoses.  Occasional alcohol, no  tobacco.   SOCIAL HISTORY:  Patient lives alone in [place].  She works as a  Scientist, physiological.  Has a two level home with bedroom upstairs, two steps to  entry.  Family can assist as needed on  discharge.   MEDICATIONS PRIOR TO ADMISSION:  1. Etodolac 500 mg twice daily.  2. Advair 250 mg Diskus twice daily.  3. Singulair daily.  4. Clarinex daily.  5. Aspirin 325 mg daily.  6. Prempro 1.5 mg daily.  7. Multivitamin.     ALLERGIES:  SHELLFISH.   HOSPITAL COURSE:  The patient with progressive gains while on rehabilitation  services with therapies initiated on a b.i.d. basis.  The following issues  were followed during the patient's rehabilitation course.  Pertaining to Ms.  Devoss cervical ACDF, re-exploration, partial hardware removal, epidural  hematoma removed Oct 13, 2004, surgical site healing nicely with cervical  collar in place at all times.  Her lumbar drain for cerebrospinal fluid leak  had since been removed.  Her strength was grossly graded 4-/5 to the lower  extremities and 4-/5 to the upper extremities, right greater than left.  Pain management ongoing with the use of Vicodin and good results.  She had  been placed on subcutaneous Lovenox for deep venous thrombosis prophylaxis.  She remained on her home regimen of Advair  and Singulair for chronic  obstructive pulmonary disease with oxygen saturations 92% on room air.  Initial urinary retention continued to improve with mobility.  Post-void  residuals were quite improved.  She was slowly weaned off her Urecholine and  monitored due to Urecholine causing some nausea.  Blood pressures maintained  with recent addition of Clonidine patch.  Diastolic pressures 64 to 68.  She  would follow up with her primary M.D., Dr. Marga Melnick.  She was to  complete her Decadron taper as per neurosurgery to be ended Oct 27, 2004.  She had recently been placed on Flexeril for some spasms of the lower  extremities with overall good results.  Functionally she was nearly modified  independence for both physical therapy and occupational therapy, left hand  persistent weakness but still improving.  Ambulating 200 feet,  supervision  for transfers, modified independent in room for activities of daily living.  Outpatient therapies had been arranged.   LABORATORY DATA:  Latest labs show hemoglobin of 11.1, hematocrit 32.1,  platelet count 282,000.  Sodium 132, potassium 4.2, BUN 13, creatinine 0.9.   DISCHARGE MEDICATIONS:  At time of dictation discharge medications included:  1. Advair 250-50 Diskus one puff twice daily.  2. Singulair 10 mg p.o. daily.  3. Claritin 10 mg p.o. daily.  4. Prempro daily.  5. Clonidine patch 0.2 mg change every 7 days.  6. Flexeril 10 mg three times daily as needed.  7. Multivitamin daily.  8. Subcutaneous Lovenox was discontinued by the time of discharge.  9. Protonix 40 mg twice daily.  10.Vicodin as needed for pain.     ACTIVITY:  Activity was as tolerated with cervical collar.   DIET:  Regular.   DISCHARGE SPECIAL INSTRUCTIONS:  Outpatient therapies as dictated per  rehabilitation services.   FOLLOW UP:  Follow up with Dr. Jordan Likes of neurosurgery, Dr. Marga Melnick for  medical management.      DA/MEDQ  D:  10/27/2004  T:  10/27/2004  Job:  [id number]   cc:   Henry A. Pool, M.D.  [address]  [place]  [place] [address]  Fax: [phone]   Titus Dubin. Alwyn Ren, M.D. [facility]

## 2010-10-15 NOTE — Discharge Summary (Signed)
NAMEKHYLER, URDA                   ACCOUNT NO.:  [id number]   MEDICAL RECORD NO.:  [id number]          PATIENT TYPE:  OIB   LOCATION:  3005                         FACILITY:  [facility]   PHYSICIAN:  Henry A. Pool, M.D.    DATE OF BIRTH:  03-05-1941   DATE OF ADMISSION:  10/12/2004  DATE OF DISCHARGE:  10/21/2004                                 DISCHARGE SUMMARY   FINAL DIAGNOSIS:  C5-6 and C6-7 spondylosis with stenosis.   HISTORY OF PRESENT ILLNESS:  [name] is a 70 year old female who presents  with a chronic neck and upper extremity symptoms, failing conservative  management. Workup demonstrates evidence of a spondylosis with stenosis at  C5-6 and C6-7. The patient presents now for two-level anterior cervical  diskectomy and fusion, with allograft and replating.   HOSPITAL COURSE:  The patient went to the operating room where an  uncomplicated C5-6 and C6-7 anterior cervical dissection and fusion with  allograft and plating was performed. Postoperatively, the patient was  initially evaluated in recovery room where she had good strength and minimal  neck pain. Progressively over the next 45 minutes the patient developed left  upper and lower extremity weakness. This weakness was quite profound. An  emergent CT scan demonstrated evidence of a postoperative epidural hematoma.  The patient was rushed back to the operating room for evacuation of the  epidural hematoma. At the time of re-exploration of the surgery the patient  did have evidence of significant epidural hematoma. This was resected.  Hemostasis was quite good. The patient's fusion was re assembled and she  returned to the recovery room. Upon returning to the recovery room the  patient did have increased strength in her left upper and lower extremity,  although these had definitely not returned to normal. The patient was  monitored in the ACU on high-dose steroids. The patient's left upper and  lower extremity strength  declined somewhat overnight. With this in mind a  followup MRI scan was obtained. MRI scanning demonstrated good resection of  the patient's epidural hematoma but there were a couple of images that were  worrisome for a residual intradural blood clot. With this in mind , I took  the patient back to the operating room and re-explored her fusion once  again. The dura was opened at C5-6 revealing a significant subdural  hematoma. This was completely evacuated. Postop, the patient had increased  strength in her left upper and lower extremity. The strength gradually  progressed to very close to normal. Postoperatively,  the patient did have  episode of CSF drainage through her wound. This was treated with 3 days of  lumbar drainage. This eradicated the CSF leak. The patient was able to be  transferred to rehab for further strengthening and conditioning. At time of  discharge from rehab, the patient's left upper extremity strength was near  normal as was her left lower extremity strength. She is ambulating with a  walker and progressing reasonably well. She had no neck or upper extremity  pain.   CONDITION ON DISCHARGE:  Stable.  ______________________________  Kathaleen Maser Pool, M.D.     HAP/MEDQ  D:  01/18/2005  T:  01/18/2005  Job:  [id number]

## 2010-10-15 NOTE — Discharge Summary (Signed)
NAMESHAQUEL, JOSEPHSON NO.:  [id number]   MEDICAL RECORD NO.:  [id number]          PATIENT TYPE:  INP   LOCATION:  2041                         FACILITY:  [facility]   PHYSICIAN:  Rene Paci, M.D. LHCDATE OF BIRTH:  [birth year]   DATE OF ADMISSION:  11/12/2004  DATE OF DISCHARGE:  11/17/2004                                 DISCHARGE SUMMARY   DISCHARGE DIAGNOSES:  1.  Acute pulmonary embolism with bilateral deep venous thromboses by lower      extremity Doppler, status post heparin drip with coumadinization.      Discharge INR 2.8 with 24-hour overlap.  2.  Recent cervical spine fusion and surgery Oct 13, 2004.  Outpatient      therapy and follow-up with neurosurgery.  Dr. Jordan Likes has previously      scheduled.  3.  Mild normocytic anemia.  Discharge hemoglobin 10.1, stable even with      anticoagulation.  Outpatient follow-up per primary M.D.  4.  History of hypertension.  No medications.  5.  History of chronic obstructive pulmonary disease.  Continue home      medications.   DISCHARGE MEDICATIONS:  1.  Coumadin 4 mg p.o. nightly except 3 mg on Tuesday and Thursday.  2.  Advair 250/50 b.i.d.  3.  Singulair 10 mg p.o. q.a.m.   DISPOSITION:  Patient is discharged home in medically stable condition.  She  has follow-up in 48 hours with her primary M.D., Dr. Alwyn Ren, for Friday,  November 19, 2004, 9:30 a.m.  for an office visit as well as INR/Coumadin check  to review dosing as well as consider referral to Coumadin clinic depending  on primary M.D. preference.  Patient is instructed to contact primary M.D.  for any problems or issues before that time or contact the emergency room  for other emergency issues.   CONDITION ON DISCHARGE:  Medically stable.  Sating 97% on room air.   HOSPITAL COURSE BY PROBLEM:  PROBLEM #1 -  PLEURISY WITH ACUTE PULMONARY  EMBOLISM:  Patient is a pleasant 70 year old woman who has been relatively  immobile following cervical spine  surgery mid May who presented to primary  M.D. the day of admission complaining of left-sided pleuritic pain.  She was  admitted for rule out PE by CT scan and angio.  CT did show a left-sided PE  with small associated pulmonary infarct and small left pleural effusion.  She was begun on IV heparin drip even prior to this formal diagnosis as well  as coumadinization.  Pharmacy managed these levels and dosing and patient  remained hemodynamically and respiratory stable during this hospitalization.  Once her INR was over 2, she received another 24 hours of overlap with  heparin prior to heparin infusion being discontinued.  She is now being  discharged with a therapeutic INR of 2.8 with a recommended Coumadin dosing  as listed above, suggested by pharmacy, close to outpatient follow-up for  Coumadin management will be per primary M.D. with initial appointment  scheduled in the next 48 hours.  She has  been tolerant of  room air with good oxygen saturations ranging 94 to 97%.  Her pain has been well controlled with minimal Vicodin intake.  Source was  confirmed to be with DVTs by venous lower extremity Dopplers showing  nonocclusive DVTs bilaterally.  See full report in chart.       VL/MEDQ  D:  11/17/2004  T:  11/17/2004  Job:  [id number]

## 2010-10-28 ENCOUNTER — Encounter: Payer: Self-pay | Admitting: Internal Medicine

## 2010-10-28 ENCOUNTER — Ambulatory Visit (INDEPENDENT_AMBULATORY_CARE_PROVIDER_SITE_OTHER): Payer: Medicare Other | Admitting: Internal Medicine

## 2010-10-28 VITALS — BP 114/70 | HR 62 | Temp 98.7°F | Resp 14 | Ht 63.5 in | Wt 142.0 lb

## 2010-10-28 DIAGNOSIS — M949 Disorder of cartilage, unspecified: Secondary | ICD-10-CM

## 2010-10-28 DIAGNOSIS — I2699 Other pulmonary embolism without acute cor pulmonale: Secondary | ICD-10-CM

## 2010-10-28 DIAGNOSIS — E782 Mixed hyperlipidemia: Secondary | ICD-10-CM

## 2010-10-28 DIAGNOSIS — E119 Type 2 diabetes mellitus without complications: Secondary | ICD-10-CM

## 2010-10-28 DIAGNOSIS — Z23 Encounter for immunization: Secondary | ICD-10-CM

## 2010-10-28 DIAGNOSIS — J45909 Unspecified asthma, uncomplicated: Secondary | ICD-10-CM

## 2010-10-28 DIAGNOSIS — Z0000 Encounter for general adult medical examination without abnormal findings: Secondary | ICD-10-CM

## 2010-10-28 DIAGNOSIS — E785 Hyperlipidemia, unspecified: Secondary | ICD-10-CM

## 2010-10-28 MED ORDER — TETANUS-DIPHTH-ACELL PERTUSSIS 5-2.5-18.5 LF-MCG/0.5 IM SUSP
0.5000 mL | Freq: Once | INTRAMUSCULAR | Status: AC
  Administered 2010-10-28: 0.5 mL via INTRAMUSCULAR

## 2010-10-28 NOTE — Progress Notes (Signed)
Subjective:    Patient ID: [name], female    DOB: [birth year], 70 y.o.   MRN: [id number]  HPI Medicare Wellness Visit:  The following psychosocial & medical history were reviewed as required by Medicare.   Social history: caffeine: 1 cup coffee daily , alcohol:  1-2 glasses  Wine/ week  ,  tobacco use : never  & exercise : yoga/ weights/walking/machines/treadmill/ bike > 60 min 3-4x/ week.   Home & personal  safety / fall risk: no issues ( see above), activities of daily living: no limitations , seatbelt use : yes , and smoke alarm employment :yes .  Power of Attorney/Living Will status : in place  Vision ( as recorded per Nurse) & Hearing  evaluation :  Whisper  Normal @ 6 ft. Orientation :oriented x3 , memory & recall :normal, spelling / math  testing: normal,and mood & affect :  normal . Depression / anxiety: denied Travel history : 69 [place] , immunization status :no Shingles to date , transfusion history:  no, and preventive health surveillance ( colonoscopies, BMD , etc as per protocol/ [facility]): colonoscopy to be scheduled, Dental care:  Seen every 6 mos . Chart reviewed &  Updated. Active issues reviewed & addressed.       Review of Systems Diabetes status assessment: Fasting or morning glucose range:  58-117; no average. . Highest glucose 2 hours after any meal:  Not checked. Hypoglycemia :  no .                                                     Excess thirst; Excess hunger;Excess urination:  no.                                  Lightheadedness with standing:  no. Chest pain; Palpitations; Pain in  calves with walking:  no .                                                                                                                                Non healing skin  ulcers or sores,especially over the feet:  no. Numbness or tingling or burning in feet : no .  Significant change in  Weight : no. Vision changes : no  .                                                                    Nutrition/diet:  Low carb. Medication compliance : yes. Medication adverse  Effects:  no . Eye exam : 06/2010, no retinopathy. Foot care : no.     Patient reports no hearing  changes, adenopathy,fever,  persistant / recurrent hoarseness , swallowing issues, persistant /recurrent cough, hemoptysis, dyspnea( rest/ exertional/paroxysmal nocturnal), gastrointestinal bleeding(melena, rectal bleeding), abdominal pain, significant heartburn,  bowel changes,GU symptoms(dysuria, hematuria,pyuria, incontinence ), Gyn symptoms(abnormal  bleeding , pain),  syncope, focal weakness,  skin/hair /nail changes,abnormal bruising or bleeding.    Objective:   Physical Exam Gen.: Healthy and well-nourished in appearance. Alert, appropriate and cooperative throughout exam. Appears much younger than age Head: Normocephalic without obvious abnormalities. Eyes: No corneal or conjunctival inflammation noted. Pupils equal round reactive to light and accommodation. Fundal exam is benign without hemorrhages, exudate, papilledema. Extraocular motion intact.  Ears: External  ear exam reveals no significant lesions or deformities. Canals clear .TMs normal. Hearing is grossly normal bilaterally. Nose: External nasal exam reveals no deformity or inflammation. Nasal mucosa are pink and moist. No lesions or exudates noted. Mouth: Oral mucosa and oropharynx reveal no lesions or exudates. Teeth in good repair. Neck: No deformities, masses, or tenderness noted. Range of motion & . Thyroid  normal. Lungs: Normal respiratory effort; chest expands symmetrically. Lungs are clear to auscultation without rales, wheezes, or increased work of breathing. Heart: Normal rate and rhythm. Normal S1 and S2. No gallop, click, or rub.  Grade 1/2 - 1 systolic  murmur. Abdomen: Bowel sounds normal; abdomen soft  and nontender. No masses, organomegaly or hernias noted. Genitalia: Dr Donovan Kail.                                                                                                                                                          Musculoskeletal/extremities: No deformity or scoliosis noted of  the thoracic or lumbar spine. No clubbing, cyanosis, edema, or deformity noted. Range of motion  normal .Tone & strength  normal.Joints normal. Nail health  good. Vascular: Carotid, radial artery, dorsalis pedis and dorsalis posterior tibial pulses are full and equal. No bruits present. Neurologic: Alert and oriented x3. Deep tendon reflexes symmetrical and normal.  Light touch normal over feet. Skin: Intact without suspicious lesions or rashes. Lymph: No cervical or axillary lymphadenopathy present. Psych: Mood and affect are normal. Normally interactive  Assessment & Plan:

## 2010-10-28 NOTE — Assessment & Plan Note (Signed)
LDL goal = < 100, ideally < 70.

## 2010-10-28 NOTE — Patient Instructions (Signed)
Preventive Health Care: Exercise  30-45  minutes a day, 3-4 days a week. Walking is especially valuable in preventing Osteoporosis. Eat a low-fat diet with lots of fruits and vegetables, up to 7-9 servings per day. Avoid obesity; your goal = waist less than 35 inches.Consume less than 30 grams of sugar per day from foods & drinks with High Fructose Corn Syrup as #2,3 or #4 on label. Schedule fasting labs: BMET, lipids, hepatic panel, CBC & dif,  TSH, A1c , urine microalbuminuria, vitamin D level( 250.00, 272.4, 733.90)

## 2010-10-31 ENCOUNTER — Encounter: Payer: Self-pay | Admitting: Internal Medicine

## 2010-11-05 ENCOUNTER — Other Ambulatory Visit: Payer: Self-pay | Admitting: Internal Medicine

## 2010-11-05 DIAGNOSIS — E785 Hyperlipidemia, unspecified: Secondary | ICD-10-CM

## 2010-11-05 DIAGNOSIS — M949 Disorder of cartilage, unspecified: Secondary | ICD-10-CM

## 2010-11-05 DIAGNOSIS — E119 Type 2 diabetes mellitus without complications: Secondary | ICD-10-CM

## 2010-11-08 ENCOUNTER — Other Ambulatory Visit (INDEPENDENT_AMBULATORY_CARE_PROVIDER_SITE_OTHER): Payer: Medicare Other

## 2010-11-08 DIAGNOSIS — E785 Hyperlipidemia, unspecified: Secondary | ICD-10-CM

## 2010-11-08 DIAGNOSIS — M899 Disorder of bone, unspecified: Secondary | ICD-10-CM

## 2010-11-08 DIAGNOSIS — E119 Type 2 diabetes mellitus without complications: Secondary | ICD-10-CM

## 2010-11-08 LAB — BASIC METABOLIC PANEL
Chloride: 110 mEq/L (ref 96–112)
GFR: 61.11 mL/min (ref >60.00)
Potassium: 4.3 mEq/L (ref 3.5–5.1)
Sodium: 140 mEq/L (ref 135–145)

## 2010-11-08 LAB — TSH: TSH: 5.16 u[IU]/mL (ref 0.35–5.50)

## 2010-11-08 LAB — LIPID PANEL
Cholesterol: 222 mg/dL — ABNORMAL HIGH (ref 0–200)
HDL: 44.4 mg/dL (ref >39.00)
Total CHOL/HDL Ratio: 5
VLDL: 51.4 mg/dL — ABNORMAL HIGH (ref 0.0–40.0)

## 2010-11-08 LAB — CBC WITH DIFFERENTIAL/PLATELET
Basophils Absolute: 0 10*3/uL (ref 0.0–0.1)
Basophils Relative: 0.4 % (ref 0.0–3.0)
Eosinophils Relative: 1.8 % (ref 0.0–5.0)
HCT: 32.2 % — ABNORMAL LOW (ref 36.0–46.0)
Hemoglobin: 11.6 g/dL — ABNORMAL LOW (ref 12.0–15.0)
Lymphocytes Relative: 31.4 % (ref 12.0–46.0)
Lymphs Abs: 2.3 10*3/uL (ref 0.7–4.0)
Monocytes Relative: 7.3 % (ref 3.0–12.0)
Neutro Abs: 4.4 10*3/uL (ref 1.4–7.7)
RBC: 3.59 Mil/uL — ABNORMAL LOW (ref 3.87–5.11)
WBC: 7.4 10*3/uL (ref 4.5–10.5)

## 2010-11-08 LAB — HEMOGLOBIN A1C: Hgb A1c MFr Bld: 6.5 % (ref 4.6–6.5)

## 2010-11-08 LAB — HEPATIC FUNCTION PANEL
Albumin: 3.8 g/dL (ref 3.5–5.2)
Total Protein: 6.8 g/dL (ref 6.0–8.3)

## 2010-11-08 LAB — MICROALBUMIN / CREATININE URINE RATIO
Creatinine,U: 122.8 mg/dL
Microalb Creat Ratio: 0.3 mg/g (ref 0.0–30.0)

## 2010-11-08 NOTE — Progress Notes (Signed)
Labs only Labs only  

## 2010-12-03 ENCOUNTER — Ambulatory Visit (INDEPENDENT_AMBULATORY_CARE_PROVIDER_SITE_OTHER): Payer: Medicare Other

## 2010-12-03 DIAGNOSIS — J309 Allergic rhinitis, unspecified: Secondary | ICD-10-CM

## 2010-12-31 ENCOUNTER — Telehealth: Payer: Self-pay | Admitting: Internal Medicine

## 2010-12-31 NOTE — Telephone Encounter (Signed)
Called and spoke with pt and she is aware that we have no samples of the singulair.  Pt is aware.

## 2011-02-23 LAB — URINALYSIS, ROUTINE W REFLEX MICROSCOPIC
Glucose, UA: NEGATIVE
Nitrite: NEGATIVE
Protein, ur: NEGATIVE
pH: 7.5

## 2011-02-23 LAB — BASIC METABOLIC PANEL
CO2: 26
Calcium: 9.6
Creatinine, Ser: 0.97
Glucose, Bld: 138 — ABNORMAL HIGH

## 2011-02-23 LAB — TYPE AND SCREEN: ABO/RH(D): B POS

## 2011-02-23 LAB — URINE MICROSCOPIC-ADD ON

## 2011-02-23 LAB — PROTIME-INR
INR: 1
Prothrombin Time: 13.6

## 2011-02-23 LAB — CBC
MCHC: 34.7
RDW: 14.2

## 2011-04-08 ENCOUNTER — Ambulatory Visit (INDEPENDENT_AMBULATORY_CARE_PROVIDER_SITE_OTHER): Payer: Medicare Other

## 2011-04-08 DIAGNOSIS — J309 Allergic rhinitis, unspecified: Secondary | ICD-10-CM

## 2011-04-25 ENCOUNTER — Other Ambulatory Visit: Payer: Self-pay | Admitting: Internal Medicine

## 2011-04-25 MED ORDER — METFORMIN HCL ER 500 MG PO TB24: 500.0000 mg | ORAL_TABLET | Freq: Two times a day (BID) | ORAL | Status: DC

## 2011-04-25 NOTE — Telephone Encounter (Signed)
RX called in, would not go through electronically  

## 2011-04-25 NOTE — Telephone Encounter (Signed)
Repeat fasting lipids, CBC & dif,iron panel, B12 /folate level, TSH, A1c after 4 months of avoiding High Fructose Corn Syrup sugar as discussed above. (Codes: 285.9,272.4,794.5,250.00) Hopp  Copied from June 2012 labs, labs were due October 2012

## 2011-05-06 ENCOUNTER — Encounter: Payer: Self-pay | Admitting: Internal Medicine

## 2011-05-09 ENCOUNTER — Encounter: Payer: Self-pay | Admitting: Internal Medicine

## 2011-05-09 ENCOUNTER — Ambulatory Visit (INDEPENDENT_AMBULATORY_CARE_PROVIDER_SITE_OTHER): Payer: Medicare Other | Admitting: Internal Medicine

## 2011-05-09 VITALS — BP 120/72 | HR 78 | Ht 63.0 in | Wt 146.4 lb

## 2011-05-09 DIAGNOSIS — I2699 Other pulmonary embolism without acute cor pulmonale: Secondary | ICD-10-CM

## 2011-05-09 DIAGNOSIS — J309 Allergic rhinitis, unspecified: Secondary | ICD-10-CM

## 2011-05-09 DIAGNOSIS — J45909 Unspecified asthma, uncomplicated: Secondary | ICD-10-CM

## 2011-05-09 MED ORDER — FLUTICASONE-SALMETEROL 250-50 MCG/DOSE IN AEPB: 1.0000 | INHALATION_SPRAY | Freq: Two times a day (BID) | RESPIRATORY_TRACT | Status: DC

## 2011-05-09 MED ORDER — AZELASTINE HCL 0.1 % NA SOLN: 2.0000 | Freq: Two times a day (BID) | NASAL | Status: DC

## 2011-05-09 MED ORDER — MOMETASONE FUROATE 50 MCG/ACT NA SUSP: 2.0000 | Freq: Once | NASAL | Status: DC

## 2011-05-09 MED ORDER — ALBUTEROL SULFATE HFA 108 (90 BASE) MCG/ACT IN AERS: 2.0000 | INHALATION_SPRAY | Freq: Four times a day (QID) | RESPIRATORY_TRACT | Status: DC | PRN

## 2011-05-09 MED ORDER — MONTELUKAST SODIUM 10 MG PO TABS: 10.0000 mg | ORAL_TABLET | Freq: Every day | ORAL | Status: DC

## 2011-05-09 NOTE — Patient Instructions (Addendum)
Meds refilled- Please call as needed  Sample Nasonex, Astepro

## 2011-05-09 NOTE — Progress Notes (Signed)
05/09/11- 70 yoF never smoker  followed for allergic rhinitis, asthma, complicated by history embolic CVA complicating C-spine surgery.LOV-06/15/10 Has had flu vaccine. Says she has done well since last visit. Now working part-time. She continues allergy vaccine and feels that definitely helps. [name] ROV-see HPI Constitutional:   No-   weight loss, night sweats, fevers, chills, fatigue, lassitude. HEENT:   No-  headaches, difficulty swallowing, tooth/dental problems, sore throat,       Little sneezing, itching, ear ache, nasal congestion, post nasal drip,  CV:  No-   chest pain, orthopnea, PND, swelling in lower extremities, anasarca,                                  dizziness, palpitations Resp: No-   shortness of breath with exertion or at rest.              No-   productive cough,  No non-productive cough,  No- coughing up of blood.              No-   change in color of mucus.  No- wheezing.   Skin: No-   rash or lesions. GI:  GU:  MS:  No-   joint pain or swelling.  No- decreased range of motion.  No- back pain. Neuro-     nothing unusual Psych:  No- change in mood or affect. No depression or anxiety.  No memory loss.    OBJ General- Alert, Oriented, Affect-appropriate, Distress- none acute Skin- rash-none, lesions- none, excoriation- none Lymphadenopathy- none Head- atraumatic            Eyes- Gross vision intact, PERRLA, conjunctivae clear secretions            Ears- Hearing, canals-normal            Nose- Clear, no-Septal dev, mucus, polyps, erosion, perforation             Throat- Mallampati II , mucosa clear , drainage- none, tonsils- atrophic Neck- flexible , trachea midline, no stridor , thyroid nl, carotid no bruit Chest - symmetrical excursion , unlabored           Heart/CV- RRR , no murmur , no gallop  , no rub, nl s1 s2                           - JVD- none , edema- none, stasis changes- none, varices- none           Lung- clear to P&A, wheeze- none, cough- none ,  dullness-none, rub- none           Chest wall-  Abd- tender-no, distended-no, bowel sounds-present, HSM- no Br/ Gen/ Rectal- Not done, not indicated Extrem- cyanosis- none, clubbing, none, atrophy- none, strength- nl Neuro- grossly intact to observation

## 2011-05-12 NOTE — Assessment & Plan Note (Signed)
Stable with good control. She does not desire changes.

## 2011-05-12 NOTE — Assessment & Plan Note (Addendum)
She remains satisfied that allergy vaccine helps her in she chooses to continue. We discussed expectations again. She asks samples of nasal sprays to get her through the doughnut hole. We refilled her EpiPen.

## 2011-06-03 ENCOUNTER — Other Ambulatory Visit: Payer: Self-pay | Admitting: Internal Medicine

## 2011-06-03 MED ORDER — METFORMIN HCL ER 500 MG PO TB24: 500.0000 mg | ORAL_TABLET | Freq: Two times a day (BID) | ORAL | Status: DC

## 2011-06-03 NOTE — Telephone Encounter (Signed)
Repeat fasting lipids, CBC & dif,iron panel, B12 /folate level, TSH, A1c after 4 months of avoiding High Fructose Corn Syrup sugar as discussed above. (Codes: 285.9,272.4,794.5,250.00) Hopp  Copied from 10/2010, labs were due 02/2011

## 2011-07-20 ENCOUNTER — Telehealth: Payer: Self-pay | Admitting: Internal Medicine

## 2011-07-20 NOTE — Telephone Encounter (Signed)
Refill: Metformin hcl er 500mg  ter. Take 1 tablet twice daily with the two largest meals. Qty 60. Last fill 06-03-11

## 2011-07-22 MED ORDER — METFORMIN HCL ER 500 MG PO TB24: 500.0000 mg | ORAL_TABLET | Freq: Two times a day (BID) | ORAL | Status: DC

## 2011-07-29 ENCOUNTER — Telehealth: Payer: Self-pay | Admitting: Internal Medicine

## 2011-07-29 NOTE — Telephone Encounter (Signed)
Patient called regarding metformin & was told that she needed a physical. She was wondering if she could come in earlier for lab work so she could go ahead & get her prescription filled. Please call @ [phone] Thanks

## 2011-07-29 NOTE — Telephone Encounter (Signed)
As stated in phone note(06/03/2011) patient is due for the following labs:    Repeat fasting lipids, CBC & dif,iron panel, B12 /folate level, TSH, A1c after 4 months of avoiding High Fructose Corn Syrup sugar as discussed above. (Codes: 285.9,272.4,794.5,250.00) Hopp   These labs were due in 02/2011

## 2011-08-04 ENCOUNTER — Other Ambulatory Visit: Payer: Self-pay | Admitting: Internal Medicine

## 2011-08-04 DIAGNOSIS — R946 Abnormal results of thyroid function studies: Secondary | ICD-10-CM

## 2011-08-04 DIAGNOSIS — E119 Type 2 diabetes mellitus without complications: Secondary | ICD-10-CM

## 2011-08-04 DIAGNOSIS — D649 Anemia, unspecified: Secondary | ICD-10-CM

## 2011-08-04 DIAGNOSIS — E785 Hyperlipidemia, unspecified: Secondary | ICD-10-CM

## 2011-08-09 ENCOUNTER — Other Ambulatory Visit (INDEPENDENT_AMBULATORY_CARE_PROVIDER_SITE_OTHER): Payer: Medicare Other

## 2011-08-09 DIAGNOSIS — D649 Anemia, unspecified: Secondary | ICD-10-CM

## 2011-08-09 DIAGNOSIS — E119 Type 2 diabetes mellitus without complications: Secondary | ICD-10-CM

## 2011-08-09 DIAGNOSIS — D539 Nutritional anemia, unspecified: Secondary | ICD-10-CM

## 2011-08-09 DIAGNOSIS — R946 Abnormal results of thyroid function studies: Secondary | ICD-10-CM

## 2011-08-09 DIAGNOSIS — E785 Hyperlipidemia, unspecified: Secondary | ICD-10-CM

## 2011-08-09 LAB — LIPID PANEL
HDL: 56.4 mg/dL (ref >39.00)
Total CHOL/HDL Ratio: 4

## 2011-08-09 LAB — CBC WITH DIFFERENTIAL/PLATELET
Basophils Relative: 0.2 % (ref 0.0–3.0)
Eosinophils Relative: 1.4 % (ref 0.0–5.0)
Lymphocytes Relative: 28.7 % (ref 12.0–46.0)
MCV: 87.4 fl (ref 78.0–100.0)
Monocytes Absolute: 0.5 10*3/uL (ref 0.1–1.0)
Monocytes Relative: 6.5 % (ref 3.0–12.0)
Neutrophils Relative %: 63.2 % (ref 43.0–77.0)
Platelets: 217 10*3/uL (ref 150.0–400.0)
RBC: 4.14 Mil/uL (ref 3.87–5.11)
WBC: 7.5 10*3/uL (ref 4.5–10.5)

## 2011-08-09 LAB — TSH: TSH: 4.16 u[IU]/mL (ref 0.35–5.50)

## 2011-08-09 LAB — HEMOGLOBIN A1C: Hgb A1c MFr Bld: 6.4 % (ref 4.6–6.5)

## 2011-08-09 LAB — LDL CHOLESTEROL, DIRECT: Direct LDL: 129.5 mg/dL

## 2011-08-09 LAB — FERRITIN: Ferritin: 29 ng/mL (ref 10.0–291.0)

## 2011-08-09 LAB — VITAMIN B12: Vitamin B-12: 550 pg/mL (ref 211–911)

## 2011-08-10 LAB — FOLATE: Folate: >24.8 ng/mL (ref >5.9)

## 2011-09-14 ENCOUNTER — Other Ambulatory Visit: Payer: Self-pay | Admitting: Internal Medicine

## 2011-09-14 NOTE — Telephone Encounter (Signed)
Refill metformin hcl er 500mg  ter #60 Qty 60 Take one tablet twice daily with the two largest meals Last filled 2.22.13  Last ov 3.12.13

## 2011-09-15 ENCOUNTER — Ambulatory Visit (INDEPENDENT_AMBULATORY_CARE_PROVIDER_SITE_OTHER): Payer: Medicare Other

## 2011-09-15 DIAGNOSIS — J309 Allergic rhinitis, unspecified: Secondary | ICD-10-CM

## 2011-09-15 MED ORDER — METFORMIN HCL ER 500 MG PO TB24: 500.0000 mg | ORAL_TABLET | Freq: Two times a day (BID) | ORAL | Status: DC

## 2011-09-15 NOTE — Telephone Encounter (Signed)
RX sent, patient with pending CPX appointment on 10/2011

## 2011-10-31 ENCOUNTER — Encounter: Payer: Medicare Other | Admitting: Internal Medicine

## 2011-12-08 ENCOUNTER — Ambulatory Visit (INDEPENDENT_AMBULATORY_CARE_PROVIDER_SITE_OTHER): Payer: Medicare Other | Admitting: Internal Medicine

## 2011-12-08 ENCOUNTER — Encounter: Payer: Self-pay | Admitting: Internal Medicine

## 2011-12-08 VITALS — BP 100/64 | HR 72 | Temp 98.6°F | Resp 12 | Ht 63.5 in | Wt 143.2 lb

## 2011-12-08 DIAGNOSIS — M949 Disorder of cartilage, unspecified: Secondary | ICD-10-CM

## 2011-12-08 DIAGNOSIS — E782 Mixed hyperlipidemia: Secondary | ICD-10-CM

## 2011-12-08 DIAGNOSIS — Z0000 Encounter for general adult medical examination without abnormal findings: Secondary | ICD-10-CM

## 2011-12-08 DIAGNOSIS — R030 Elevated blood-pressure reading, without diagnosis of hypertension: Secondary | ICD-10-CM

## 2011-12-08 DIAGNOSIS — E119 Type 2 diabetes mellitus without complications: Secondary | ICD-10-CM

## 2011-12-08 DIAGNOSIS — M899 Disorder of bone, unspecified: Secondary | ICD-10-CM

## 2011-12-08 MED ORDER — METFORMIN HCL ER 500 MG PO TB24: 500.0000 mg | ORAL_TABLET | Freq: Two times a day (BID) | ORAL | Status: DC

## 2011-12-08 NOTE — Progress Notes (Signed)
Subjective:    Patient ID: [name], female    DOB: 06-15-1940, 71 y.o.   MRN: [id number]  HPI Medicare Wellness Visit:  The following psychosocial & medical history were reviewed as required by Medicare.   Social history: caffeine: 1 cup/day , alcohol:  4 drinks / week ,  tobacco use : never  & exercise : Gym X 3 days for 60 min   Home & personal  safety / fall risk: no issues, activities of daily living: no limitations , seatbelt use : yes , and smoke alarm employment : yes.  Power of Attorney/Living Will status : in place  Vision ( as recorded per Nurse) & Hearing  evaluation : Ophth exam 2/13, early cataracts. Hearing evaluation negative 2012 Orientation :oriented X 3 , memory & recall :good,  math testing: good,and mood & affect : normal . Depression / anxiety: denied Travel history :[place] 2012  , immunization status :Shingles needed , transfusion history:  no, and preventive health surveillance ( colonoscopies, BMD , etc as per protocol/ [facility]): colonoscopy past due , Dental care: every 6 months . Chart reviewed &  Updated. Active issues reviewed & addressed.       Review of Systems DIABETES: Disease Monitoring: 08/09/11 A1c 6.4 % Blood Sugar ranges-99-135  Polyuria/phagia/dipsia- no       Visual problems- no Medications: Compliance- no  Hypoglycemic symptoms- no  HYPERLIPIDEMIA: Disease Monitoring: 08/09/11 TG 190; LDL 129.5 Chest pain, palpitations-no       Dyspnea- no Lightheadedness,Syncope- no    Edema-no Medications: Compliance-no statin Abd pain, bowel changes- no   Muscle aches- no but L hip pain           Objective:   Physical Exam Gen.: Healthy and well-nourished in appearance. Alert, appropriate and cooperative throughout exam.Appears younger than stated age  Head: Normocephalic without obvious abnormalities Eyes: No corneal or conjunctival inflammation noted. Pupils equal round reactive to light and accommodation.  Extraocular motion intact. Vision  grossly normal with lenses. Ears: External  ear exam reveals no significant lesions or deformities. Canals clear .TMs normal. Hearing is grossly normal bilaterally. Nose: External nasal exam reveals no deformity or inflammation. Nasal mucosa are pink and moist. No lesions or exudates noted.   Mouth: Oral mucosa and oropharynx reveal no lesions or exudates. Teeth in good repair. Neck: No deformities, masses, or tenderness noted. Range of motion decreased laterally. Thyroid normal. Lungs: Normal respiratory effort; chest expands symmetrically. Lungs are clear to auscultation without rales, wheezes, or increased work of breathing. Heart: Normal rate and rhythm. Normal S1 and S2. No gallop, click, or rub. Grade 1/6 systolic murmur  Abdomen: Bowel sounds normal; abdomen soft and nontender. No masses, organomegaly or hernias noted. Genitalia: Dr Tenny Craw Musculoskeletal/extremities: Minor lordosis noted of  the thoracic . No clubbing, cyanosis, edema, or deformity noted. Range of motion  normal .Tone & strength  normal.Joints normal. Nail health  good. Vascular: Carotid, radial artery,  and  posterior tibial pulses are full and equal.Decreased DPP. No bruits present. Neurologic: Alert and oriented x3. Deep tendon reflexes symmetrical and normal.         Skin: Intact without suspicious lesions or rashes. Scattered ecchymoses of arms Lymph: No cervical, axillary lymphadenopathy present. Psych: Mood and affect are normal. Normally interactive  Assessment & Plan:  #1 Medicare Wellness Exam; criteria met ; data entered #2 Problem List reviewed ; Assessment/ Recommendations made Plan: see Orders

## 2011-12-08 NOTE — Patient Instructions (Addendum)
The most common cause of elevated triglycerides is the ingestion of sugar from high fructose corn syrup sources added to processed foods & drinks.  Eat a low-fat diet with lots of fruits and vegetables, up to 7-9 servings per day. Consume less than 30 (preferably ZERO) grams of sugar per day from foods & drinks with High Fructose Corn Syrup (HFCS) sugar as #1,2,3 or # 4 on label.Whole [employer], [employer] & [employer] do not carry products with HFCS. Follow a  low carb nutrition program such as [place] or The [employer]  to prevent Diabetes progression . White carbohydrates (potatoes, rice, bread, and pasta) have a high spike of sugar and a high load of sugar. For example a  baked potato has a cup of sugar and a  french fry  2 teaspoons of sugar. Yams, wild  rice, whole grained bread &  wheat pasta have been much lower spike and load of  sugar. Portions should be the size of a deck of cards or your palm. Read Dr Gildardo Griffes Eat , Drink & Be Healthy  PLEASE BRING THESE INSTRUCTIONS TO FOLLOW UP  LAB APPOINTMENT in 3 months for lipids, vit D level & A1c.This will guarantee correct labs are drawn, eliminating need for repeat blood sampling ( needle sticks ! ). Diagnoses /Codes: 277.7,250.00, 268.9. Please try to go on My Chart within the next 24 hours to allow me to release the results directly to you.

## 2011-12-08 NOTE — Assessment & Plan Note (Signed)
See hyperlipidemia; hopefully by reducing the triglycerides, diabetic control improved and meds can be adjusted

## 2011-12-08 NOTE — Assessment & Plan Note (Addendum)
Her triglycerides dropped from 257 in June of 2012  To 190 in March of this year. Nutrition will be stressed with repeat lipids in 3 months. Dr. Gildardo Griffes book on cholesterol recommended

## 2012-02-08 ENCOUNTER — Ambulatory Visit (INDEPENDENT_AMBULATORY_CARE_PROVIDER_SITE_OTHER): Payer: Medicare Other

## 2012-02-08 DIAGNOSIS — J309 Allergic rhinitis, unspecified: Secondary | ICD-10-CM

## 2012-03-09 ENCOUNTER — Other Ambulatory Visit: Payer: Medicare Other

## 2012-03-15 ENCOUNTER — Ambulatory Visit (INDEPENDENT_AMBULATORY_CARE_PROVIDER_SITE_OTHER): Payer: Medicare Other

## 2012-03-15 DIAGNOSIS — Z23 Encounter for immunization: Secondary | ICD-10-CM

## 2012-03-23 ENCOUNTER — Other Ambulatory Visit: Payer: Medicare Other

## 2012-03-26 ENCOUNTER — Other Ambulatory Visit (INDEPENDENT_AMBULATORY_CARE_PROVIDER_SITE_OTHER): Payer: Medicare Other

## 2012-03-26 DIAGNOSIS — E782 Mixed hyperlipidemia: Secondary | ICD-10-CM

## 2012-03-26 DIAGNOSIS — E8881 Metabolic syndrome: Secondary | ICD-10-CM

## 2012-03-26 DIAGNOSIS — E119 Type 2 diabetes mellitus without complications: Secondary | ICD-10-CM

## 2012-03-26 DIAGNOSIS — E559 Vitamin D deficiency, unspecified: Secondary | ICD-10-CM

## 2012-03-26 LAB — LIPID PANEL: Total CHOL/HDL Ratio: 5

## 2012-03-26 LAB — LDL CHOLESTEROL, DIRECT: Direct LDL: 143.1 mg/dL

## 2012-03-29 LAB — VITAMIN D 1,25 DIHYDROXY
Vitamin D 1, 25 (OH)2 Total: 46 pg/mL (ref 18–72)
Vitamin D2 1, 25 (OH)2: <8 pg/mL
Vitamin D3 1, 25 (OH)2: 46 pg/mL

## 2012-04-11 ENCOUNTER — Encounter: Payer: Self-pay | Admitting: Internal Medicine

## 2012-04-11 ENCOUNTER — Ambulatory Visit (INDEPENDENT_AMBULATORY_CARE_PROVIDER_SITE_OTHER): Payer: Medicare Other | Admitting: Internal Medicine

## 2012-04-11 VITALS — BP 128/76 | HR 81 | Wt 146.2 lb

## 2012-04-11 DIAGNOSIS — E119 Type 2 diabetes mellitus without complications: Secondary | ICD-10-CM

## 2012-04-11 DIAGNOSIS — R030 Elevated blood-pressure reading, without diagnosis of hypertension: Secondary | ICD-10-CM

## 2012-04-11 DIAGNOSIS — E782 Mixed hyperlipidemia: Secondary | ICD-10-CM

## 2012-04-11 MED ORDER — PRAVASTATIN SODIUM 20 MG PO TABS: 20.0000 mg | ORAL_TABLET | Freq: Every day | ORAL | Status: DC

## 2012-04-11 NOTE — Assessment & Plan Note (Signed)
Diabetic control is good despite an increase in triglycerides suggesting suboptimal nutritional intervention. Options discussed

## 2012-04-11 NOTE — Progress Notes (Signed)
  Subjective:    Patient ID: [name], female    DOB: 03-30-1941, 71 y.o.   MRN: [id number]  HPI  DIABETES: Disease Monitoring: Blood Sugar ranges-87-112; highest 2 hrs post meal : 134  Ophth exam : 2/13 ; no retinopathy Medications: Compliance- yes Hypoglycemic symptoms- no  HYPERLIPIDEMIA: Disease Monitoring: Medications: Compliance- no statin; heart healthy diet attempted   PMH of elevated BP W/O Dx of HYPERTENSION: Disease Monitoring: Blood pressure range-not checked except @ MD appts  Medications: Compliance- no meds; sodium restricted         Review of Systems Chest pain, palpitations- no      Dyspnea- no Lightheadedness,Syncope- no   Edema- no Polyuria/phagia/dipsia- no     Visual problems- no Abd pain, bowel changes- no   Muscle aches- no . She has had steroid injections on 3 occasions this year; 2 within the hip and one in the sole. She is receiving needle treatments to the hip without medication by Physiotherapist. Surgery is being contemplated.       Objective:   Physical Exam Gen.: Appears younger than stated age ; well-nourished in appearance. Alert, appropriate and cooperative throughout exam. Head: Normocephalic without obvious abnormalities Eyes: No corneal or conjunctival inflammation noted. Mouth: Oral mucosa and oropharynx reveal no lesions or exudates. Teeth in good repair. Neck: No deformities, masses, or tenderness noted. Range of motion slightly decreased. Thyroid normal. Lungs: Normal respiratory effort; chest expands symmetrically. Lungs are clear to auscultation without rales, wheezes, or increased work of breathing. Heart: Normal rate and rhythm. Normal S1 and S2. No gallop, click, or rub. Grade 1/2 over 6 systolic murmur .                                                                                   Musculoskeletal/extremities:  No clubbing, cyanosis, edema, or deformity noted. Finger joints normal. Nail health  Good.Asymmetry of  forearms. Vascular: Carotid, radial artery, dorsalis pedis and  posterior tibial pulses are full and equal. No bruits present. Neurologic: Alert and oriented x3. Deep tendon reflexes symmetrical and normal.  Light touch normal over feet.  Light touch normal over feet.          Skin: Intact without suspicious lesions or rashes. Lymph: No cervical, axillary lymphadenopathy present. Psych: Mood and affect are normal. Normally interactive                                                                                         Assessment & Plan:

## 2012-04-11 NOTE — Patient Instructions (Addendum)
The most common cause of elevated triglycerides is the ingestion of sugar from high fructose corn syrup sources added to processed foods & drinks.  Eat a low-fat diet with lots of fruits and vegetables, up to 7-9 servings per day. Consume less than 30 (preferably ZERO) grams of sugar per day from foods & drinks with High Fructose Corn Syrup (HFCS) sugar as #1,2,3 or # 4 on label.Whole Foods, [employer] & [employer] do not carry products with HFCS. Follow a  low carb nutrition program such as [place] or [employer] Sugar [employer]  to prevent Diabetes progression . White carbohydrates (potatoes, rice, bread, and pasta) have a high spike of sugar and a high load of sugar. For example a  baked potato has a cup of sugar and a  french fry  2 teaspoons of sugar. Yams, wild  rice, whole grained bread &  wheat pasta have been much lower spike and load of  sugar. Portions should be the size of a deck of cards or your palm.  Blood Pressure Goal  Ideally is an AVERAGE < 135/85. This AVERAGE should be calculated from @ least 5-7 BP readings taken @ different times of day on different days of week. You should not respond to isolated BP readings , but rather the AVERAGE for that week.  Please  schedule fasting Labs : CK,Lipids, hepatic panel in 10-12 weeks. PLEASE BRING THESE INSTRUCTIONS TO FOLLOW UP  LAB APPOINTMENT.This will guarantee correct labs are drawn, eliminating need for repeat blood sampling ( needle sticks ! ). Diagnoses /Codes:250.00, 272.4, 995.20,V17.3 .  If you activate My Chart; the results can be released to you as soon as they populate from the lab. If you choose not to use this program; the labs have to be reviewed, copied & mailed   causing a delay in getting the results to you.

## 2012-04-11 NOTE — Assessment & Plan Note (Signed)
BP goals discussed ; to continue ASA 81 mg daily

## 2012-04-11 NOTE — Assessment & Plan Note (Addendum)
Minimal LDL goal is less than 100; preferred is less and 70 in view of the diabetes and a history of coronary artery disease in her twin. Nutritional interventions should help; but medication indicated because of this risk.

## 2012-05-08 ENCOUNTER — Ambulatory Visit: Payer: Medicare Other | Admitting: Internal Medicine

## 2012-05-15 ENCOUNTER — Encounter: Payer: Self-pay | Admitting: Internal Medicine

## 2012-05-15 ENCOUNTER — Ambulatory Visit (INDEPENDENT_AMBULATORY_CARE_PROVIDER_SITE_OTHER): Payer: Medicare Other | Admitting: Internal Medicine

## 2012-05-15 VITALS — BP 118/80 | HR 74 | Ht 63.0 in | Wt 146.6 lb

## 2012-05-15 DIAGNOSIS — J302 Other seasonal allergic rhinitis: Secondary | ICD-10-CM

## 2012-05-15 DIAGNOSIS — J309 Allergic rhinitis, unspecified: Secondary | ICD-10-CM

## 2012-05-15 DIAGNOSIS — J45909 Unspecified asthma, uncomplicated: Secondary | ICD-10-CM

## 2012-05-15 DIAGNOSIS — J45998 Other asthma: Secondary | ICD-10-CM

## 2012-05-15 MED ORDER — ALBUTEROL SULFATE HFA 108 (90 BASE) MCG/ACT IN AERS: 2.0000 | INHALATION_SPRAY | Freq: Four times a day (QID) | RESPIRATORY_TRACT | Status: DC | PRN

## 2012-05-15 MED ORDER — AZELASTINE-FLUTICASONE 137-50 MCG/ACT NA SUSP: 2.0000 | Freq: Every day | NASAL | Status: DC

## 2012-05-15 MED ORDER — FLUTICASONE-SALMETEROL 250-50 MCG/DOSE IN AEPB: 1.0000 | INHALATION_SPRAY | Freq: Two times a day (BID) | RESPIRATORY_TRACT | Status: DC

## 2012-05-15 MED ORDER — EPINEPHRINE 0.3 MG/0.3ML IJ DEVI: 0.3000 mg | Freq: Once | INTRAMUSCULAR | Status: AC

## 2012-05-15 NOTE — Patient Instructions (Addendum)
Refill scripts for Epipen, Advair and Proair  We can continue allergy vaccine at 1:10  Sample Dymista nasal spray for trial     1-2 puffs each nostril once daily at bedtime  Let us know- we can send a script for Dymista or return to Nasonex and Azelastine nasal sprays.

## 2012-05-15 NOTE — Progress Notes (Signed)
05/09/11- 70 yoF never smoker  followed for allergic rhinitis, asthma, complicated by history embolic CVA complicating C-spine surgery.LOV-06/15/10 Has had flu vaccine. Says she has done well since last visit. Now working part-time. She continues allergy vaccine and feels that definitely helps.  05/15/12- 71 yoF never smoker  followed for allergic rhinitis, asthma, complicated by history embolic CVA complicating C-spine surgery FOLLOWS FOR: allergy attack last Thursday; dry cough today but no wheezing or SOB at this time Doing well with allergy vaccine 1:10 GO. We discussed risks and benefits, goals and duration of therapy. She is satisfied to continue. Blames "allergy attack" for increased sneezing and watery rhinorrhea, undefined trigger. She continues OTC antihistamine at bedtime. Out of Nasonex. Occasional wheeze. In the fall season uses rescue inhaler 2 or 3 times per week. [name] ROV-see HPI Constitutional:   No-   weight loss, night sweats, fevers, chills, fatigue, lassitude. HEENT:   No-  headaches, difficulty swallowing, tooth/dental problems, sore throat,       + Variable sneezing, itching, ear ache, nasal congestion, post nasal drip,  CV:  No-   chest pain, orthopnea, PND, swelling in lower extremities, anasarca, dizziness, palpitations Resp: No-   shortness of breath with exertion or at rest.              No-   productive cough,  No non-productive cough,  No- coughing up of blood.              No-   change in color of mucus.  No- wheezing.   Skin: No-   rash or lesions. GI:  GU:  MS:  No-   joint pain or swelling.  No- decreased range of motion.  No- back pain. Neuro-     nothing unusual Psych:  No- change in mood or affect. No depression or anxiety.  No memory loss.  OBJ General- Alert, Oriented, Affect-appropriate, Distress- none acute Skin- rash-none, lesions- none, excoriation- none Lymphadenopathy- none Head- atraumatic            Eyes- Gross vision intact, PERRLA, conjunctivae  clear secretions            Ears- Hearing, canals-normal            Nose- Clear, no-Septal dev, mucus, polyps, erosion, perforation             Throat- Mallampati II , mucosa clear , drainage- none, tonsils- atrophic Neck- flexible , trachea midline, no stridor , thyroid nl, carotid no bruit Chest - symmetrical excursion , unlabored           Heart/CV- RRR , no murmur , no gallop  , no rub, nl s1 s2                           - JVD- none , edema- none, stasis changes- none, varices- none           Lung- clear to P&A, wheeze- none, cough- none , dullness-none, rub- none           Chest wall-  Abd-  Br/ Gen/ Rectal- Not done, not indicated Extrem- cyanosis- none, clubbing, none, atrophy- none, strength- nl Neuro- grossly intact to observation

## 2012-05-21 ENCOUNTER — Ambulatory Visit (INDEPENDENT_AMBULATORY_CARE_PROVIDER_SITE_OTHER): Payer: Medicare Other

## 2012-05-21 DIAGNOSIS — J309 Allergic rhinitis, unspecified: Secondary | ICD-10-CM

## 2012-05-22 ENCOUNTER — Encounter (HOSPITAL_COMMUNITY): Payer: Self-pay | Admitting: Pharmacy Technician

## 2012-05-24 NOTE — Progress Notes (Signed)
Requested pre op orders from [place] at Dr Vevelyn Royals office

## 2012-05-25 ENCOUNTER — Other Ambulatory Visit (HOSPITAL_COMMUNITY): Payer: Self-pay | Admitting: Orthopaedic Surgery

## 2012-05-28 ENCOUNTER — Encounter (HOSPITAL_COMMUNITY)
Admission: RE | Admit: 2012-05-28 | Discharge: 2012-05-28 | Disposition: A | Discharge status: Home / Self Care | Payer: Medicare Other | Source: Ambulatory Visit | Attending: Orthopaedic Surgery | Admitting: Orthopaedic Surgery

## 2012-05-28 ENCOUNTER — Ambulatory Visit (HOSPITAL_COMMUNITY)
Admission: RE | Admit: 2012-05-28 | Discharge: 2012-05-28 | Disposition: A | Discharge status: Home / Self Care | Payer: Medicare Other | Source: Ambulatory Visit | Attending: Orthopaedic Surgery | Admitting: Orthopaedic Surgery

## 2012-05-28 ENCOUNTER — Encounter (HOSPITAL_COMMUNITY): Payer: Self-pay

## 2012-05-28 DIAGNOSIS — J438 Other emphysema: Secondary | ICD-10-CM | POA: Insufficient documentation

## 2012-05-28 DIAGNOSIS — Z01811 Encounter for preprocedural respiratory examination: Secondary | ICD-10-CM | POA: Insufficient documentation

## 2012-05-28 HISTORY — DX: Unspecified osteoarthritis, unspecified site: M19.90

## 2012-05-28 HISTORY — DX: Other complications of anesthesia, initial encounter: T88.59XA

## 2012-05-28 HISTORY — DX: Gastro-esophageal reflux disease without esophagitis: K21.9

## 2012-05-28 HISTORY — DX: Adverse effect of unspecified anesthetic, initial encounter: T41.45XA

## 2012-05-28 LAB — URINALYSIS, ROUTINE W REFLEX MICROSCOPIC
Bilirubin Urine: NEGATIVE
Glucose, UA: NEGATIVE mg/dL
Nitrite: NEGATIVE
Specific Gravity, Urine: 1.006 (ref 1.005–1.030)
pH: 7 (ref 5.0–8.0)

## 2012-05-28 LAB — COMPREHENSIVE METABOLIC PANEL
ALT: 21 U/L (ref 0–35)
AST: 19 U/L (ref 0–37)
Albumin: 3.6 g/dL (ref 3.5–5.2)
Alkaline Phosphatase: 75 U/L (ref 39–117)
CO2: 27 mEq/L (ref 19–32)
Chloride: 104 mEq/L (ref 96–112)
Creatinine, Ser: 0.88 mg/dL (ref 0.50–1.10)
GFR calc non Af Amer: 65 mL/min — ABNORMAL LOW (ref >90)
Potassium: 4 mEq/L (ref 3.5–5.1)
Sodium: 139 mEq/L (ref 135–145)
Total Bilirubin: 0.2 mg/dL — ABNORMAL LOW (ref 0.3–1.2)

## 2012-05-28 LAB — CBC
Platelets: 262 10*3/uL (ref 150–400)
RBC: 4.27 MIL/uL (ref 3.87–5.11)
RDW: 13.8 % (ref 11.5–15.5)
WBC: 7 10*3/uL (ref 4.0–10.5)

## 2012-05-28 LAB — APTT: aPTT: 35 seconds (ref 24–37)

## 2012-05-28 LAB — SURGICAL PCR SCREEN: Staphylococcus aureus: NEGATIVE

## 2012-05-28 LAB — PROTIME-INR: INR: 0.96 (ref 0.00–1.49)

## 2012-05-28 LAB — URINE MICROSCOPIC-ADD ON

## 2012-05-28 NOTE — Patient Instructions (Addendum)
20 [name]  05/28/2012   Your procedure is scheduled on: 06/01/12  Report to [facility] at 10:30AM.  Call this number if you have problems the morning of surgery [phone]: [phone]   Remember: please bring inhalers    Do not eat food or drink liquids After Midnight.     Take these medicines the morning of surgery with A SIP OF WATER: albuterol if needed, vicodin if needed, prilosec if needed   Do not wear jewelry, make-up or nail polish.  Do not wear lotions, powders, or perfumes. You may wear deodorant.  Do not shave 48 hours prior to surgery. Men may shave face and neck.  Do not bring valuables to the hospital.  Contacts, dentures or bridgework may not be worn into surgery.  Leave suitcase in the car. After surgery it may be brought to your room.  For patients admitted to the hospital, checkout time is 11:00 AM the day of discharge.     Please read over the following fact sheets that you were given: MRSA Information, blood fact sheet, incentive spirometer fact sheet Birdie Sons, RN  pre op nurse call if needed [phone]    FAILURE TO FOLLOW THESE INSTRUCTIONS MAY RESULT IN CANCELLATION OF YOUR SURGERY   Patient Signature: ___________________________________________

## 2012-05-28 NOTE — Progress Notes (Signed)
EKG 12/08/11 on EPIC

## 2012-05-28 NOTE — Progress Notes (Signed)
UA and urine micro results routed to Dr. Magnus Ivan via [facility]

## 2012-05-28 NOTE — Assessment & Plan Note (Signed)
Plan-to continue allergy vaccine. We discussed use of nasal steroids and antihistamines. Try a sample Dymista nasal spray. Refill EpiPen

## 2012-05-28 NOTE — Assessment & Plan Note (Signed)
Controlled with Advair 250 and appropriate use of rescue inhaler.

## 2012-06-01 ENCOUNTER — Encounter (HOSPITAL_COMMUNITY): Payer: Self-pay | Admitting: *Deleted

## 2012-06-01 ENCOUNTER — Inpatient Hospital Stay (HOSPITAL_COMMUNITY): Payer: Medicare Other | Admitting: Registered Nurse

## 2012-06-01 ENCOUNTER — Encounter (HOSPITAL_COMMUNITY)
Admission: RE | Disposition: A | Discharge status: Home Health | Payer: Self-pay | Source: Ambulatory Visit | Attending: Orthopaedic Surgery

## 2012-06-01 ENCOUNTER — Inpatient Hospital Stay (HOSPITAL_COMMUNITY): Payer: Medicare Other

## 2012-06-01 ENCOUNTER — Encounter (HOSPITAL_COMMUNITY): Payer: Self-pay | Admitting: Registered Nurse

## 2012-06-01 ENCOUNTER — Inpatient Hospital Stay (HOSPITAL_COMMUNITY)
Admission: RE | Admit: 2012-06-01 | Discharge: 2012-06-04 | DRG: 470 | Disposition: A | Discharge status: Home Health | Payer: Medicare Other | Source: Ambulatory Visit | Attending: Orthopaedic Surgery | Admitting: Orthopaedic Surgery

## 2012-06-01 DIAGNOSIS — M169 Osteoarthritis of hip, unspecified: Principal | ICD-10-CM | POA: Diagnosis present

## 2012-06-01 DIAGNOSIS — E785 Hyperlipidemia, unspecified: Secondary | ICD-10-CM | POA: Diagnosis present

## 2012-06-01 DIAGNOSIS — E119 Type 2 diabetes mellitus without complications: Secondary | ICD-10-CM

## 2012-06-01 DIAGNOSIS — I10 Essential (primary) hypertension: Secondary | ICD-10-CM | POA: Diagnosis present

## 2012-06-01 DIAGNOSIS — Z86718 Personal history of other venous thrombosis and embolism: Secondary | ICD-10-CM

## 2012-06-01 DIAGNOSIS — K219 Gastro-esophageal reflux disease without esophagitis: Secondary | ICD-10-CM | POA: Diagnosis present

## 2012-06-01 DIAGNOSIS — M1610 Unilateral primary osteoarthritis, unspecified hip: Principal | ICD-10-CM | POA: Diagnosis present

## 2012-06-01 DIAGNOSIS — Z86711 Personal history of pulmonary embolism: Secondary | ICD-10-CM

## 2012-06-01 HISTORY — PX: TOTAL HIP ARTHROPLASTY: SHX124

## 2012-06-01 LAB — TYPE AND SCREEN
ABO/RH(D): B POS
Antibody Screen: NEGATIVE

## 2012-06-01 LAB — GLUCOSE, CAPILLARY: Glucose-Capillary: 139 mg/dL — ABNORMAL HIGH (ref 70–99)

## 2012-06-01 SURGERY — ARTHROPLASTY, HIP, TOTAL, ANTERIOR APPROACH
Anesthesia: General | Site: Hip | Laterality: Left | Wound class: Clean

## 2012-06-01 MED ORDER — PHENOL 1.4 % MT LIQD: 1.0000 | OROMUCOSAL | Status: DC | PRN

## 2012-06-01 MED ORDER — MIDAZOLAM HCL 5 MG/5ML IJ SOLN
INTRAMUSCULAR | Status: DC | PRN
  Administered 2012-06-01: 2 mg via INTRAVENOUS

## 2012-06-01 MED ORDER — LORATADINE 10 MG PO TABS
10.0000 mg | ORAL_TABLET | Freq: Every day | ORAL | Status: DC
  Administered 2012-06-01 – 2012-06-04 (×4): 10 mg via ORAL
  Filled 2012-06-01 (×4): qty 1

## 2012-06-01 MED ORDER — DIPHENHYDRAMINE HCL 12.5 MG/5ML PO ELIX: 12.5000 mg | ORAL_SOLUTION | ORAL | Status: DC | PRN

## 2012-06-01 MED ORDER — LACTATED RINGERS IV SOLN: INTRAVENOUS | Status: DC

## 2012-06-01 MED ORDER — ACETAMINOPHEN 650 MG RE SUPP: 650.0000 mg | Freq: Four times a day (QID) | RECTAL | Status: DC | PRN

## 2012-06-01 MED ORDER — NEOSTIGMINE METHYLSULFATE 1 MG/ML IJ SOLN
INTRAMUSCULAR | Status: DC | PRN
  Administered 2012-06-01: 3.5 mg via INTRAVENOUS

## 2012-06-01 MED ORDER — METOCLOPRAMIDE HCL 10 MG PO TABS: 5.0000 mg | ORAL_TABLET | Freq: Three times a day (TID) | ORAL | Status: DC | PRN

## 2012-06-01 MED ORDER — ONDANSETRON HCL 4 MG/2ML IJ SOLN
INTRAMUSCULAR | Status: DC | PRN
  Administered 2012-06-01: 4 mg via INTRAVENOUS

## 2012-06-01 MED ORDER — ONDANSETRON HCL 4 MG PO TABS: 4.0000 mg | ORAL_TABLET | Freq: Four times a day (QID) | ORAL | Status: DC | PRN

## 2012-06-01 MED ORDER — HYDROMORPHONE HCL PF 1 MG/ML IJ SOLN
INTRAMUSCULAR | Status: DC | PRN
  Administered 2012-06-01 (×4): 0.5 mg via INTRAVENOUS

## 2012-06-01 MED ORDER — CEFAZOLIN SODIUM-DEXTROSE 2-3 GM-% IV SOLR
2.0000 g | INTRAVENOUS | Status: AC
  Administered 2012-06-01: 2 g via INTRAVENOUS

## 2012-06-01 MED ORDER — GLYCOPYRROLATE 0.2 MG/ML IJ SOLN
INTRAMUSCULAR | Status: DC | PRN
  Administered 2012-06-01: .5 mg via INTRAVENOUS

## 2012-06-01 MED ORDER — ZOLPIDEM TARTRATE 5 MG PO TABS: 5.0000 mg | ORAL_TABLET | Freq: Every evening | ORAL | Status: DC | PRN

## 2012-06-01 MED ORDER — PROPOFOL 10 MG/ML IV BOLUS
INTRAVENOUS | Status: DC | PRN
  Administered 2012-06-01: 120 mg via INTRAVENOUS

## 2012-06-01 MED ORDER — METFORMIN HCL ER 500 MG PO TB24
500.0000 mg | ORAL_TABLET | Freq: Two times a day (BID) | ORAL | Status: DC
Start: 2012-06-01 — End: 2012-06-04
  Administered 2012-06-01 – 2012-06-04 (×6): 500 mg via ORAL
  Filled 2012-06-01 (×8): qty 1

## 2012-06-01 MED ORDER — OXYCODONE HCL ER 10 MG PO T12A
10.0000 mg | EXTENDED_RELEASE_TABLET | Freq: Two times a day (BID) | ORAL | Status: DC
  Administered 2012-06-01 – 2012-06-04 (×6): 10 mg via ORAL
  Filled 2012-06-01 (×6): qty 1

## 2012-06-01 MED ORDER — LIDOCAINE HCL (CARDIAC) 20 MG/ML IV SOLN
INTRAVENOUS | Status: DC | PRN
  Administered 2012-06-01: 60 mg via INTRAVENOUS

## 2012-06-01 MED ORDER — SUCCINYLCHOLINE CHLORIDE 20 MG/ML IJ SOLN
INTRAMUSCULAR | Status: DC | PRN
  Administered 2012-06-01: 100 mg via INTRAVENOUS

## 2012-06-01 MED ORDER — ROCURONIUM BROMIDE 100 MG/10ML IV SOLN
INTRAVENOUS | Status: DC | PRN
  Administered 2012-06-01: 10 mg via INTRAVENOUS
  Administered 2012-06-01: 30 mg via INTRAVENOUS
  Administered 2012-06-01: 10 mg via INTRAVENOUS

## 2012-06-01 MED ORDER — ALUM & MAG HYDROXIDE-SIMETH 200-200-20 MG/5ML PO SUSP: 30.0000 mL | ORAL | Status: DC | PRN

## 2012-06-01 MED ORDER — METHOCARBAMOL 500 MG PO TABS
500.0000 mg | ORAL_TABLET | Freq: Four times a day (QID) | ORAL | Status: DC | PRN
  Administered 2012-06-01 – 2012-06-03 (×8): 500 mg via ORAL
  Filled 2012-06-01 (×8): qty 1

## 2012-06-01 MED ORDER — KETOROLAC TROMETHAMINE 15 MG/ML IJ SOLN
7.5000 mg | Freq: Four times a day (QID) | INTRAMUSCULAR | Status: AC
  Administered 2012-06-01 – 2012-06-02 (×3): 7.5 mg via INTRAVENOUS
  Filled 2012-06-01 (×4): qty 1

## 2012-06-01 MED ORDER — 0.9 % SODIUM CHLORIDE (POUR BTL) OPTIME
TOPICAL | Status: DC | PRN
  Administered 2012-06-01: 1000 mL

## 2012-06-01 MED ORDER — FLUTICASONE PROPIONATE 50 MCG/ACT NA SUSP
1.0000 | Freq: Every day | NASAL | Status: DC
  Administered 2012-06-02 – 2012-06-03 (×2): 1 via NASAL
  Filled 2012-06-01: qty 16

## 2012-06-01 MED ORDER — ASPIRIN EC 325 MG PO TBEC
325.0000 mg | DELAYED_RELEASE_TABLET | Freq: Every day | ORAL | Status: DC
  Administered 2012-06-02 – 2012-06-04 (×3): 325 mg via ORAL
  Filled 2012-06-01 (×4): qty 1

## 2012-06-01 MED ORDER — LACTATED RINGERS IV SOLN
INTRAVENOUS | Status: DC
  Administered 2012-06-01: 1000 mL via INTRAVENOUS

## 2012-06-01 MED ORDER — AZELASTINE HCL 0.1 % NA SOLN
2.0000 | Freq: Two times a day (BID) | NASAL | Status: DC
  Administered 2012-06-02 – 2012-06-04 (×4): 2 via NASAL
  Filled 2012-06-01: qty 30

## 2012-06-01 MED ORDER — FENTANYL CITRATE 0.05 MG/ML IJ SOLN
INTRAMUSCULAR | Status: DC | PRN
  Administered 2012-06-01: 100 ug via INTRAVENOUS
  Administered 2012-06-01 (×3): 50 ug via INTRAVENOUS

## 2012-06-01 MED ORDER — FERROUS SULFATE 325 (65 FE) MG PO TABS
325.0000 mg | ORAL_TABLET | Freq: Three times a day (TID) | ORAL | Status: DC
  Administered 2012-06-02 – 2012-06-04 (×6): 325 mg via ORAL
  Filled 2012-06-01 (×11): qty 1

## 2012-06-01 MED ORDER — PANTOPRAZOLE SODIUM 40 MG PO TBEC
40.0000 mg | DELAYED_RELEASE_TABLET | Freq: Every day | ORAL | Status: DC
  Administered 2012-06-01 – 2012-06-04 (×4): 40 mg via ORAL
  Filled 2012-06-01 (×4): qty 1

## 2012-06-01 MED ORDER — DEXAMETHASONE SODIUM PHOSPHATE 10 MG/ML IJ SOLN
INTRAMUSCULAR | Status: DC | PRN
  Administered 2012-06-01: 10 mg via INTRAVENOUS

## 2012-06-01 MED ORDER — METHOCARBAMOL 100 MG/ML IJ SOLN
500.0000 mg | Freq: Four times a day (QID) | INTRAVENOUS | Status: DC | PRN
  Administered 2012-06-01: 500 mg via INTRAVENOUS
  Filled 2012-06-01 (×2): qty 5

## 2012-06-01 MED ORDER — ONDANSETRON HCL 4 MG/2ML IJ SOLN: 4.0000 mg | Freq: Four times a day (QID) | INTRAMUSCULAR | Status: DC | PRN

## 2012-06-01 MED ORDER — ACETAMINOPHEN 325 MG PO TABS: 650.0000 mg | ORAL_TABLET | Freq: Four times a day (QID) | ORAL | Status: DC | PRN

## 2012-06-01 MED ORDER — OXYCODONE HCL 5 MG PO TABS
5.0000 mg | ORAL_TABLET | ORAL | Status: DC | PRN
  Administered 2012-06-01 (×2): 5 mg via ORAL
  Administered 2012-06-02 – 2012-06-03 (×11): 10 mg via ORAL
  Administered 2012-06-03: 5 mg via ORAL
  Administered 2012-06-03 – 2012-06-04 (×3): 10 mg via ORAL
  Filled 2012-06-01 (×3): qty 2
  Filled 2012-06-01: qty 1
  Filled 2012-06-01 (×2): qty 2
  Filled 2012-06-01: qty 1
  Filled 2012-06-01 (×10): qty 2

## 2012-06-01 MED ORDER — CEFAZOLIN SODIUM 1-5 GM-% IV SOLN
1.0000 g | Freq: Four times a day (QID) | INTRAVENOUS | Status: AC
  Administered 2012-06-01 – 2012-06-02 (×2): 1 g via INTRAVENOUS
  Filled 2012-06-01 (×2): qty 50

## 2012-06-01 MED ORDER — AZELASTINE-FLUTICASONE 137-50 MCG/ACT NA SUSP: 2.0000 | Freq: Every day | NASAL | Status: DC

## 2012-06-01 MED ORDER — DOCUSATE SODIUM 100 MG PO CAPS
100.0000 mg | ORAL_CAPSULE | Freq: Two times a day (BID) | ORAL | Status: DC
  Administered 2012-06-01 – 2012-06-04 (×6): 100 mg via ORAL
  Filled 2012-06-01 (×8): qty 1

## 2012-06-01 MED ORDER — MENTHOL 3 MG MT LOZG
1.0000 | LOZENGE | OROMUCOSAL | Status: DC | PRN
  Filled 2012-06-01: qty 9

## 2012-06-01 MED ORDER — METOCLOPRAMIDE HCL 5 MG/ML IJ SOLN: 5.0000 mg | Freq: Three times a day (TID) | INTRAMUSCULAR | Status: DC | PRN

## 2012-06-01 MED ORDER — ACETAMINOPHEN 10 MG/ML IV SOLN
INTRAVENOUS | Status: DC | PRN
  Administered 2012-06-01: 1000 mg via INTRAVENOUS

## 2012-06-01 MED ORDER — SODIUM CHLORIDE 0.9 % IV SOLN
INTRAVENOUS | Status: DC
  Administered 2012-06-01 – 2012-06-02 (×2): via INTRAVENOUS

## 2012-06-01 MED ORDER — AZELASTINE HCL 0.1 % NA SOLN
2.0000 | Freq: Two times a day (BID) | NASAL | Status: DC
Start: 2012-06-01 — End: 2012-06-01

## 2012-06-01 MED ORDER — HYDROMORPHONE HCL PF 1 MG/ML IJ SOLN
0.2500 mg | INTRAMUSCULAR | Status: DC | PRN
  Administered 2012-06-01 (×2): 0.5 mg via INTRAVENOUS

## 2012-06-01 MED ORDER — ALBUTEROL SULFATE HFA 108 (90 BASE) MCG/ACT IN AERS
2.0000 | INHALATION_SPRAY | Freq: Four times a day (QID) | RESPIRATORY_TRACT | Status: DC | PRN
  Filled 2012-06-01: qty 6.7

## 2012-06-01 MED ORDER — HYDROMORPHONE HCL PF 1 MG/ML IJ SOLN
1.0000 mg | INTRAMUSCULAR | Status: DC | PRN
  Administered 2012-06-01: 0.5 mg via INTRAVENOUS
  Administered 2012-06-01 – 2012-06-03 (×4): 1 mg via INTRAVENOUS
  Filled 2012-06-01 (×5): qty 1

## 2012-06-01 SURGICAL SUPPLY — 37 items
BAG SPEC THK2 15X12 ZIP CLS (MISCELLANEOUS) ×2
BAG ZIPLOCK 12X15 (MISCELLANEOUS) ×4 IMPLANT
BLADE SAW SGTL 18X1.27X75 (BLADE) ×2 IMPLANT
CELLS DAT CNTRL 66122 CELL SVR (MISCELLANEOUS) ×1 IMPLANT
CLOTH BEACON ORANGE TIMEOUT ST (SAFETY) ×2 IMPLANT
DRAPE C-ARM 42X72 X-RAY (DRAPES) ×2 IMPLANT
DRAPE STERI IOBAN 125X83 (DRAPES) ×2 IMPLANT
DRAPE U-SHAPE 47X51 STRL (DRAPES) ×6 IMPLANT
DRSG MEPILEX BORDER 4X8 (GAUZE/BANDAGES/DRESSINGS) ×2 IMPLANT
DRSG XEROFORM 1X8 (GAUZE/BANDAGES/DRESSINGS) ×1 IMPLANT
DURAPREP 26ML APPLICATOR (WOUND CARE) ×2 IMPLANT
ELECT BLADE TIP CTD 4 INCH (ELECTRODE) ×2 IMPLANT
ELECT REM PT RETURN 9FT ADLT (ELECTROSURGICAL) ×2
ELECTRODE REM PT RTRN 9FT ADLT (ELECTROSURGICAL) ×1 IMPLANT
FACESHIELD LNG OPTICON STERILE (SAFETY) ×8 IMPLANT
GAUZE XEROFORM 1X8 LF (GAUZE/BANDAGES/DRESSINGS) ×2 IMPLANT
GLOVE BIO SURGEON STRL SZ7 (GLOVE) ×2 IMPLANT
GLOVE BIO SURGEON STRL SZ7.5 (GLOVE) ×2 IMPLANT
GLOVE BIOGEL PI IND STRL 7.5 (GLOVE) IMPLANT
GLOVE BIOGEL PI IND STRL 8 (GLOVE) ×1 IMPLANT
GLOVE BIOGEL PI INDICATOR 7.5 (GLOVE) ×1
GLOVE BIOGEL PI INDICATOR 8 (GLOVE) ×1
GLOVE ECLIPSE 7.0 STRL STRAW (GLOVE) ×2 IMPLANT
GOWN STRL REIN XL XLG (GOWN DISPOSABLE) ×4 IMPLANT
KIT BASIN OR (CUSTOM PROCEDURE TRAY) ×2 IMPLANT
PACK TOTAL JOINT (CUSTOM PROCEDURE TRAY) ×2 IMPLANT
PADDING CAST COTTON 6X4 STRL (CAST SUPPLIES) ×2 IMPLANT
RTRCTR WOUND ALEXIS 18CM MED (MISCELLANEOUS) ×2
STAPLER VISISTAT 35W (STAPLE) IMPLANT
SUT ETHIBOND NAB CT1 #1 30IN (SUTURE) ×4 IMPLANT
SUT VIC AB 1 CT1 36 (SUTURE) ×4 IMPLANT
SUT VIC AB 2-0 CT1 27 (SUTURE)
SUT VIC AB 2-0 CT1 TAPERPNT 27 (SUTURE) ×2 IMPLANT
SUT VLOC 180 0 24IN GS25 (SUTURE) ×1 IMPLANT
TOWEL OR 17X26 10 PK STRL BLUE (TOWEL DISPOSABLE) ×4 IMPLANT
TOWEL OR NON WOVEN STRL DISP B (DISPOSABLE) ×2 IMPLANT
TRAY FOLEY CATH 14FRSI W/METER (CATHETERS) ×2 IMPLANT

## 2012-06-01 NOTE — Preoperative (Signed)
Beta Blockers   Reason not to administer Beta Blockers:Not Applicable 

## 2012-06-01 NOTE — H&P (Signed)
TOTAL HIP ADMISSION H&P  Patient is admitted for left total hip arthroplasty.  Subjective:  Chief Complaint: left hip pain  HPI: [name], 72 y.o. female, has a history of pain and functional disability in the left hip(s) due to arthritis and patient has failed non-surgical conservative treatments for greater than 12 weeks to include NSAID's and/or analgesics, use of assistive devices, weight reduction as appropriate and activity modification.  Onset of symptoms was gradual starting 3 years ago with gradually worsening course since that time.The patient noted no past surgery on the left hip(s).  Patient currently rates pain in the left hip at 10 out of 10 with activity. Patient has night pain, worsening of pain with activity and weight bearing, pain that interfers with activities of daily living, pain with passive range of motion and crepitus. Patient has evidence of subchondral cysts, subchondral sclerosis, periarticular osteophytes and joint space narrowing by imaging studies. This condition presents safety issues increasing the risk of falls.  There is no current active infection.  Patient Active Problem List   Diagnosis Date Noted  . Degenerative arthritis of hip 06/01/2012  . POSTMENOPAUSAL SYNDROME 09/16/2009  . ARTHRALGIA 09/16/2009  . Seasonal and perennial allergic rhinitis 10/08/2007  . OSTEOPENIA 08/06/2007  . ELEVATED BLOOD PRESSURE WITHOUT DIAGNOSIS OF HYPERTENSION 08/06/2007  . AODM 06/08/2007  . HYPERLIPIDEMIA 05/14/2007  . Allergic-infective asthma 05/14/2007  . PAIN IN JOINT, SHOULDER REGION 05/14/2007  . PE 08/31/2006  . DVT 08/31/2006   Past Medical History  Diagnosis Date  . Hyperlipidemia   . Hypertension 2004    Hypertensive response on Stress Test  . DVT (deep venous thrombosis) 2006    post immobilization post cns surgery  . PTE (pulmonary thromboembolism) 2006  . Basal cell cancer     LUE; Porokeratosis also  . Granulomatous lung disease 2002   incidental Xray finding  . Paralysis 2006    post cervical fusion with spinal sac tear  with hematoma   . Asthma     adult onset  . Diabetes mellitus 2010    A1c 6.7%  . GERD (gastroesophageal reflux disease)     very mild  . Arthritis   . Complication of anesthesia     small trachea    Past Surgical History  Procedure Date  . Bunionectomy   . Septoplasty   . Tubal ligation   . Colonoscopy     negative  . Rotator cuff repair 2009    Right  . Epidural steroids 2006    cervical spine  . Cervical fusion 2006    Dr Jordan Likes, [place];post op hematoma & cns leak & urinary retention  . Tonsillectomy 72 years old    No prescriptions prior to admission   No Known Allergies  History  Substance Use Topics  . Smoking status: Never Smoker   . Smokeless tobacco: Never Used  . Alcohol Use: 0.0 oz/week     Comment: twice a week    Family History  Problem Relation Age of Onset  . COPD Father     emphysema  . Cancer Mother     cns cancer  . Diabetes Sister     TWIN sister ; also Fibromyalgia ; S/P stent 2004  . Heart disease Sister     stents @ 60 & 68  . Stroke Maternal Grandmother     in 27s  . Transient ischemic attack Paternal Aunt      Review of Systems  Musculoskeletal: Positive for joint pain.  All other systems reviewed and are negative.    Objective:  Physical Exam  Constitutional: She is oriented to person, place, and time. She appears well-developed and well-nourished.  HENT:  Head: Normocephalic and atraumatic.  Eyes: EOM are normal. Pupils are equal, round, and reactive to light.  Neck: Normal range of motion. Neck supple.  Cardiovascular: Normal rate and regular rhythm.   Respiratory: Effort normal and breath sounds normal.  GI: Soft. Bowel sounds are normal.  Musculoskeletal:       Left hip: She exhibits decreased range of motion, decreased strength, bony tenderness and crepitus.  Neurological: She is alert and oriented to person, place, and time.  Skin:  Skin is warm and dry.  Psychiatric: She has a normal mood and affect.    Vital signs in last 24 hours:    Labs:   Estimated Body mass index is 25.49 kg/(m^2) as calculated from the following:   Height as of 12/08/11: 5' 3.5"(1.613 m).   Weight as of 04/11/12: 146 lb 3.2 oz(66.316 kg).   Imaging Review Plain radiographs demonstrate severe degenerative joint disease of the left hip(s). The bone quality appears to be good for age and reported activity level.  Assessment/Plan:  End stage arthritis, left hip(s)  The patient history, physical examination, clinical judgement of the provider and imaging studies are consistent with end stage degenerative joint disease of the left hip(s) and total hip arthroplasty is deemed medically necessary. The treatment options including medical management, injection therapy, arthroscopy and arthroplasty were discussed at length. The risks and benefits of total hip arthroplasty were presented and reviewed. The risks due to aseptic loosening, infection, stiffness, dislocation/subluxation,  thromboembolic complications and other imponderables were discussed.  The patient acknowledged the explanation, agreed to proceed with the plan and consent was signed. Patient is being admitted for inpatient treatment for surgery, pain control, PT, OT, prophylactic antibiotics, VTE prophylaxis, progressive ambulation and ADL's and discharge planning.The patient is planning to be discharged to skilled nursing facility unless has help at home then she would have HHPT.

## 2012-06-01 NOTE — Op Note (Signed)
[name], [name] NO.:  [id number]  MEDICAL RECORD NO.:  [id number]  LOCATION:  1522                         FACILITY:  [facility]  PHYSICIAN:  Vanita Panda. Magnus Ivan, M.D.DATE OF BIRTH:  09-12-1940  DATE OF PROCEDURE:  06/01/2012 DATE OF DISCHARGE:                              OPERATIVE REPORT   PREOPERATIVE DIAGNOSIS:  End-stage arthritis and degenerative joint disease, left hip.  POSTOPERATIVE DIAGNOSIS:  End-stage arthritis and degenerative joint disease, left hip.  PROCEDURE:  Left total hip arthroplasty with direct anterior approach.  IMPLANTS:  DePuy Sector Gription acetabular component, size 50, size 32+ 4 neutral polyethylene liner, size 10 Corail femoral component with varus offset (KLA), size 32+ 1 metal hip ball.  SURGEON:  Vanita Panda. Magnus Ivan, M.D.  ASSISTANT:  Wende Neighbors, PA-C.  ANESTHESIA:  General.  ANTIBIOTICS:  2 g of IV Ancef.  BLOOD LOSS:  Less than 500 mL.  COMPLICATIONS:  None.  INDICATIONS:  [name] is a 72 year old active female with known, well documented end-stage arthritis involving her left hip.  The pain is gotten to be daily, it affects her mobility and her activities of daily living.  She has night pain and pain with motion.  She has failed conservative treatment.  She has had at least two steroid injections in her hip and they were no longer helping.  She wished to proceed with a total hip arthroplasty.  We have discussed the risks and benefits of this to her and she does wish to proceed.  PROCEDURE DESCRIPTION:  After informed consent was obtained and appropriate left hip was marked, she was brought to the operating room and general anesthesia was obtained while she was on her stretcher.  A Foley catheter was placed and then both feet were placed in traction boots.  She was placed supine on the Hana fracture table with both feet in inline skeletal traction, but no traction applied.  A perineal  post was placed as well.  Her left hip was then assessed under direct fluoroscopy as well as obtaining the center of the pelvis that we could obtain the leg lengths.  Her left hip was then prepped and draped with DuraPrep and sterile drapes.  A time-out was called, and she was identified as correct patient and correct left hip.  I then made an incision just posterior and inferior to the anterior superior iliac spine and dissected down to the tensor fascia lata.  The tensor fascia was then divided longitudinally and I proceeded with a direct anterior approach to the hip.  A Cobra retractor was placed around the lateral neck and one underneath the rectus femoris, it was placed under medial neck.  I cauterized the lateral femoral circumflex vessels and then I opened up the hip capsule and placed the retractors within the hip capsule.  I then made my femoral neck cut just proximal to the lesser trochanter.  I completed the cut with an osteotome.  After cutting with an oscillating saw, I placed a corkscrew guide in the femoral head and removed the femoral head in its entirety and found it to be devoid of cartilage.  I then cleaned the  acetabular and debris and put bent Hohmann medially and a Cobra retractor laterally.  I released the transverse acetabular ligament.  I then began reaming from a size 42 reamer and 2-mm increments up to size 50.  All reamers were placed under direct visualization.  The last reamer also under direct fluoroscopy, so we could obtain my depth of reaming, my inclination, and anteversion.  I then placed the real DePuy Sector Gription acetabular component, size 50, the apex hole eliminator guide and the 32+ 4 neutral polyethylene liner.  Attention was then turned to the femur with the leg externally rotated to 90 degrees, extended and adducted.  I placed a Mueller retractor medially and a Hohmann retractor laterally.  I released the lateral capsule and then used a box  cutting guide in the femoral canal. I first broached, I did pass this out medial bump, but it did not cause any other compromise to the bone.  Once I recognized this under direct fluoroscopy and visualization, the rest of the broaches were placed directly down the canal.  I finished with a size 10 broach.  I trialed a varus offset neck and a 32+ 1 hip ball, reduced this in the acetabulum and it was stable with internal and external rotation, flexion and extension.  It had no shuck and her leg lengths showed she was just a touch long.  I then removed the trial components.  I lateralized little bit more and then placed the real Corail femoral component size 10 with varus offset, the real 32+ 1 metal hip ball, and reduced this back in the acetabulum and again it was stable.  I was able to irrigate the soft tissues with normal saline solution.  We closed the joint capsule with interrupted #1 Ethibond suture for tight closure of the joint capsule. We used a running 0 V-Loc suture in the tensor fascia, 2-0 Vicryl in the deep and subcutaneous tissues and staples on the skin.  She was then taken off the Hana table, awakened, extubated, and taken to recovery room in stable condition.  All final counts were correct.  There were no complications noted.     Vanita Panda. Magnus Ivan, M.D.     CYB/MEDQ  D:  06/01/2012  T:  06/01/2012  Job:  [id number]

## 2012-06-01 NOTE — Brief Op Note (Signed)
06/01/2012  2:49 PM  PATIENT:  [name]  72 y.o. female  PRE-OPERATIVE DIAGNOSIS:  Severe osteoarthritis left hip  POST-OPERATIVE DIAGNOSIS:  Severe osteoarthritis left hip  PROCEDURE:  Procedure(s) (LRB) with comments: TOTAL HIP ARTHROPLASTY ANTERIOR APPROACH (Left) - Left Total Hip Arthroplasty  SURGEON:  Surgeon(s) and Role:    * Kathryne Hitch, MD - Primary  PHYSICIAN ASSISTANT:   ASSISTANTS: Maud Deed, PA-C   ANESTHESIA:   general  EBL:  Total I/O In: 1000 [I.V.:1000] Out: 450 [Urine:100; Blood:350]  BLOOD ADMINISTERED:none  DRAINS: none   LOCAL MEDICATIONS USED:  NONE  SPECIMEN:  No Specimen  DISPOSITION OF SPECIMEN:  N/A  COUNTS:  YES  TOURNIQUET:  * No tourniquets in log *  DICTATION: .Other Dictation: Dictation Number [id number]  PLAN OF CARE: Admit to inpatient   PATIENT DISPOSITION:  PACU - hemodynamically stable.   Delay start of Pharmacological VTE agent (>24hrs) due to surgical blood loss or risk of bleeding: no

## 2012-06-01 NOTE — Progress Notes (Signed)
Utilization review completed.  

## 2012-06-01 NOTE — Transfer of Care (Signed)
Immediate Anesthesia Transfer of Care Note  Patient: [name]  Procedure(s) Performed: Procedure(s) (LRB) with comments: TOTAL HIP ARTHROPLASTY ANTERIOR APPROACH (Left) - Left Total Hip Arthroplasty  Patient Location: PACU  Anesthesia Type:General  Level of Consciousness: awake, alert , oriented and patient cooperative  Airway & Oxygen Therapy: Patient Spontanous Breathing and Patient connected to face mask oxygen  Post-op Assessment: Report given to PACU RN, Post -op Vital signs reviewed and stable and Patient moving all extremities  Post vital signs: Reviewed and stable  Complications: No apparent anesthesia complications

## 2012-06-01 NOTE — Anesthesia Postprocedure Evaluation (Signed)
  Anesthesia Post-op Note  Patient: [name]  Procedure(s) Performed: Procedure(s) (LRB): TOTAL HIP ARTHROPLASTY ANTERIOR APPROACH (Left)  Patient Location: PACU  Anesthesia Type: General  Level of Consciousness: awake and alert   Airway and Oxygen Therapy: Patient Spontanous Breathing  Post-op Pain: mild  Post-op Assessment: Post-op Vital signs reviewed, Patient's Cardiovascular Status Stable, Respiratory Function Stable, Patent Airway and No signs of Nausea or vomiting  Last Vitals:  Filed Vitals:   06/01/12 1530  BP: 166/71  Pulse: 82  Temp:   Resp: 19    Post-op Vital Signs: stable   Complications: No apparent anesthesia complications

## 2012-06-01 NOTE — Plan of Care (Signed)
Problem: Consults Goal: Diagnosis- Total Joint Replacement Left anterior hip     

## 2012-06-01 NOTE — Anesthesia Preprocedure Evaluation (Addendum)
Anesthesia Evaluation  Patient identified by MRN, date of birth, ID band Patient awake    Reviewed: Allergy & Precautions, H&P , NPO status , Patient's Chart, lab work & pertinent test results  History of Anesthesia Complications (+) AWARENESS UNDER ANESTHESIA  Airway Mallampati: III TM Distance: >3 FB Neck ROM: Limited    Dental  (+) Caps and Dental Advisory Given,    Pulmonary neg pulmonary ROS, asthma ,  Allergic/ URTI associated asthma.  Patient states that she has a "small trachea." breath sounds clear to auscultation  Pulmonary exam normal       Cardiovascular Exercise Tolerance: Good hypertension, Pt. on medications negative cardio ROS  Rhythm:regular Rate:Normal     Neuro/Psych Cervical fusion with complications ( dural tear and hematoma with temporary paralysis ) negative neurological ROS  negative psych ROS   GI/Hepatic negative GI ROS, Neg liver ROS, GERD-  Medicated and Controlled,  Endo/Other  negative endocrine ROSdiabetes, Well Controlled, Type 2, Oral Hypoglycemic Agents  Renal/GU negative Renal ROS  negative genitourinary   Musculoskeletal   Abdominal   Peds  Hematology negative hematology ROS (+)   Anesthesia Other Findings   Reproductive/Obstetrics negative OB ROS                          Anesthesia Physical Anesthesia Plan  ASA: III  Anesthesia Plan: General   Post-op Pain Management:    Induction: Intravenous  Airway Management Planned: Video Laryngoscope Planned and Oral ETT  Additional Equipment:   Intra-op Plan:   Post-operative Plan: Extubation in OR  Informed Consent: I have reviewed the patients History and Physical, chart, labs and discussed the procedure including the risks, benefits and alternatives for the proposed anesthesia with the patient or authorized representative who has indicated his/her understanding and acceptance.   Dental Advisory  Given  Plan Discussed with: CRNA and Surgeon  Anesthesia Plan Comments:         Anesthesia Quick Evaluation

## 2012-06-02 LAB — BASIC METABOLIC PANEL
BUN: 14 mg/dL (ref 6–23)
Calcium: 8.6 mg/dL (ref 8.4–10.5)
Creatinine, Ser: 0.83 mg/dL (ref 0.50–1.10)
GFR calc non Af Amer: 69 mL/min — ABNORMAL LOW (ref >90)
Glucose, Bld: 141 mg/dL — ABNORMAL HIGH (ref 70–99)
Sodium: 134 mEq/L — ABNORMAL LOW (ref 135–145)

## 2012-06-02 LAB — CBC
Hemoglobin: 9.7 g/dL — ABNORMAL LOW (ref 12.0–15.0)
MCH: 28.4 pg (ref 26.0–34.0)
MCHC: 33.7 g/dL (ref 30.0–36.0)
MCV: 84.2 fL (ref 78.0–100.0)

## 2012-06-02 MED ORDER — MOMETASONE FURO-FORMOTEROL FUM 100-5 MCG/ACT IN AERO
2.0000 | INHALATION_SPRAY | Freq: Two times a day (BID) | RESPIRATORY_TRACT | Status: DC
  Administered 2012-06-02 – 2012-06-04 (×4): 2 via RESPIRATORY_TRACT
  Filled 2012-06-02: qty 8.8

## 2012-06-02 MED ORDER — POLYETHYLENE GLYCOL 3350 17 G PO PACK
17.0000 g | PACK | Freq: Two times a day (BID) | ORAL | Status: DC
  Administered 2012-06-02 – 2012-06-04 (×4): 17 g via ORAL
  Filled 2012-06-02 (×6): qty 1

## 2012-06-02 NOTE — Progress Notes (Signed)
Subjective: 1 Day Post-Op Procedure(s) (LRB): TOTAL HIP ARTHROPLASTY ANTERIOR APPROACH (Left) Patient reports pain as mild.    Objective: Vital signs in last 24 hours: Temp:  [97.4 F (36.3 C)-98.9 F (37.2 C)] 98.7 F (37.1 C) (01/04 1030) Pulse Rate:  [71-91] 78  (01/04 1030) Resp:  [14-19] 16  (01/04 1030) BP: (111-173)/(51-81) 118/51 mmHg (01/04 1030) SpO2:  [96 %-100 %] 99 % (01/04 1030) Weight:  [65.318 kg (144 lb)] 65.318 kg (144 lb) (01/03 1600)  Intake/Output from previous day: 01/03 0701 - 01/04 0700 In: 3937.5 [P.O.:1080; I.V.:2802.5; IV Piggyback:55] Out: 2655 [Urine:2305; Blood:350] Intake/Output this shift: Total I/O In: 468.8 [I.V.:468.8] Out: 700 [Urine:700]   Basename 06/02/12 0425  HGB 9.7*    Basename 06/02/12 0425  WBC 13.5*  RBC 3.42*  HCT 28.8*  PLT 222    Basename 06/02/12 0425  NA 134*  K 4.6  CL 102  CO2 22  BUN 14  CREATININE 0.83  GLUCOSE 141*  CALCIUM 8.6   No results found for this basename: LABPT:2,INR:2 in the last 72 hours  Neurologically intact  Assessment/Plan: 1 Day Post-Op Procedure(s) (LRB): TOTAL HIP ARTHROPLASTY ANTERIOR APPROACH (Left) Up with therapy  Christina Waldrop C 06/02/2012, 11:32 AM

## 2012-06-02 NOTE — Progress Notes (Signed)
06/02/12 1500  PT Visit Information  Last PT Received On 06/02/12  Assistance Needed +1  PT Time Calculation  PT Start Time 1437  PT Stop Time 1502  PT Time Calculation (min) 25 min  Subjective Data  Subjective i just went to the bathroom  Precautions  Precautions None  Cognition  Overall Cognitive Status Appears within functional limits for tasks assessed/performed  Arousal/Alertness Awake/alert  Orientation Level Appears intact for tasks assessed  Behavior During Session [facility] for tasks performed  Bed Mobility  Bed Mobility Sit to Supine;Supine to Sit  Supine to Sit 4: Min assist  Sit to Supine 4: Min assist  Details for Bed Mobility Assistance min with LLE  Transfers  Transfers Sit to Stand;Stand to Sit  Sit to Stand 4: Min guard;From bed  Stand to Sit 4: Min guard;To bed  Details for Transfer Assistance cues for hand placement  Ambulation/Gait  Ambulation/Gait Assistance 4: Min guard;5: Supervision  Ambulation Distance (Feet) 110 Feet  Assistive device Rolling walker  Ambulation/Gait Assistance Details cues for sequence and Rw distance from self  Gait Pattern Step-to pattern;Antalgic  Total Joint Exercises  Ankle Circles/Pumps AROM;15 reps;Both  Heel Slides AROM;AAROM;Left;10 reps;Supine  Hip ABduction/ADduction AROM;Left;10 reps;Standing  Long Arc Quad AROM;Left;10 reps;Seated  Knee Flexion AROM;Left;10 reps;Standing  Standing Hip Extension AROM;10 reps;Left;Standing  PT - End of Session  Activity Tolerance Patient tolerated treatment well  Patient left in bed;with call bell/phone within reach;with nursing in room;with family/visitor present  PT - Assessment/Plan  Comments on Treatment Session progressing well  PT Plan Discharge plan remains appropriate;Frequency remains appropriate  PT Frequency 7X/week  Follow Up Recommendations Home health PT  PT equipment None recommended by PT  Acute Rehab PT Goals  Time For Goal Achievement 06/09/12  Potential to Achieve  Goals Good  Pt will go Supine/Side to Sit with supervision  PT Goal: Supine/Side to Sit - Progress Progressing toward goal  Pt will go Sit to Supine/Side with supervision  PT Goal: Sit to Supine/Side - Progress Progressing toward goal  Pt will go Sit to Stand with supervision;with modified independence  PT Goal: Sit to Stand - Progress Progressing toward goal  Pt will go Stand to Sit with supervision;with modified independence  PT Goal: Stand to Sit - Progress Progressing toward goal  Pt will Ambulate 51 - 150 feet;with supervision;with modified independence;with least restrictive assistive device  PT Goal: Ambulate - Progress Progressing toward goal  Pt will Perform Home Exercise Program with supervision, verbal cues required/provided  PT Goal: Perform Home Exercise Program - Progress Progressing toward goal  PT General Charges  $$ ACUTE PT VISIT 1 Procedure  PT Treatments  $Gait Training 8-22 mins  $Therapeutic Exercise 8-22 mins

## 2012-06-02 NOTE — Evaluation (Addendum)
Occupational Therapy Evaluation Patient Details Name: QUAMESHA MULLET MRN: [id number] DOB: [birth year] Today's Date: 06/02/2012 Time: 4098-1191 OT Time Calculation (min): 14 min  OT Assessment / Plan / Recommendation Clinical Impression  This 72 year old female was admitted for L anterior direct hip.  All education was completed.  Pt does not need any further OT at this time.      OT Assessment       Follow Up Recommendations  No OT follow up    Barriers to Discharge      Equipment Recommendations  None recommended by OT (pt will try standard commode at home (sink next to it))    Recommendations for Other Services    Frequency       Precautions / Restrictions Precautions Precautions: None Restrictions Weight Bearing Restrictions: No   Pertinent Vitals/Pain 2/10 L hip in bed; 5/10 after walking to bathroom.  Repositioned, ice applied and RN alerted    ADL  Grooming: Performed;Wash/dry hands;Supervision/safety Where Assessed - Grooming: Supported Copywriter, advertising: Performed;Min Pension scheme manager Method: Sit to Barista: Comfort height toilet;Grab bars Toileting - Architect and Hygiene: Performed;Min guard Where Assessed - Engineer, mining and Hygiene: Sit to stand from 3-in-1 or toilet Transfers/Ambulation Related to ADLs: ambulated to bathroom with min guard.  One cue to keep walker in front of her when turning ADL Comments: Pt needs min A overall for LB adls.  Daughter will help her.  She doesn't have a reacher at home. Reviewed shower sequence.  Pt did not feel that she needed to practice.      OT Diagnosis:    OT Problem List:   OT Treatment Interventions:     OT Goals    Visit Information  Last OT Received On: 06/02/12 Assistance Needed: +1    Subjective Data  Subjective: I think I'll be OK with the regular toilet Patient Stated Goal: be able to do a flight of stairs so she can return home   Prior  Functioning     Home Living Lives With: Alone Available Help at Discharge: Family Type of Home: House Bathroom Shower/Tub: Health visitor: Standard Additional Comments: will stay at daughter's initially Communication Communication: No difficulties         Vision/Perception     Cognition  Overall Cognitive Status: Appears within functional limits for tasks assessed/performed Arousal/Alertness: Awake/alert Orientation Level: Appears intact for tasks assessed Behavior During Session: [facility] for tasks performed    Extremity/Trunk Assessment Right Upper Extremity Assessment RUE ROM/Strength/Tone: [facility] for tasks assessed Left Upper Extremity Assessment LUE ROM/Strength/Tone: WFL for tasks assessed     Mobility Bed Mobility Supine to Sit: 4: Min assist Transfers Sit to Stand: 4: Min guard;4: Min assist;From bed;From toilet (min guard from toilet.  Min A from bed) Details for Transfer Assistance: cue for hand placement     Shoulder Instructions     Exercise     Balance     End of Session OT - End of Session Activity Tolerance: Patient tolerated treatment well Patient left: in bed;with call bell/phone within reach;with family/visitor present  GO     Gearldene Fiorenza 06/02/2012, 2:15 PM Marica Otter, OTR/L [phone] 06/02/2012

## 2012-06-02 NOTE — Progress Notes (Signed)
Subjective: 1 Day Post-Op Procedure(s) (LRB): TOTAL HIP ARTHROPLASTY ANTERIOR APPROACH (Left) Patient reports pain as 7 on 0-10 scale.    Objective: Vital signs in last 24 hours: Temp:  [97.4 F (36.3 C)-98.9 F (37.2 C)] 98.7 F (37.1 C) (01/04 1030) Pulse Rate:  [71-91] 78  (01/04 1030) Resp:  [14-19] 16  (01/04 1030) BP: (111-173)/(51-81) 118/51 mmHg (01/04 1030) SpO2:  [96 %-100 %] 99 % (01/04 1030) Weight:  [65.318 kg (144 lb)] 65.318 kg (144 lb) (01/03 1600)  Intake/Output from previous day: 01/03 0701 - 01/04 0700 In: 3937.5 [P.O.:1080; I.V.:2802.5; IV Piggyback:55] Out: 2655 [Urine:2305; Blood:350] Intake/Output this shift: Total I/O In: 468.8 [I.V.:468.8] Out: 700 [Urine:700]   Basename 06/02/12 0425  HGB 9.7*    Basename 06/02/12 0425  WBC 13.5*  RBC 3.42*  HCT 28.8*  PLT 222    Basename 06/02/12 0425  NA 134*  K 4.6  CL 102  CO2 22  BUN 14  CREATININE 0.83  GLUCOSE 141*  CALCIUM 8.6   No results found for this basename: LABPT:2,INR:2 in the last 72 hours  Neurologically intact  Assessment/Plan: 1 Day Post-Op Procedure(s) (LRB): TOTAL HIP ARTHROPLASTY ANTERIOR APPROACH (Left) Up with therapy  VOIDING .   YATES,MARK C 06/02/2012, 11:21 AM

## 2012-06-02 NOTE — Evaluation (Signed)
Physical Therapy Evaluation Patient Details Name: [name] MRN: [id number] DOB: [birth year] Today's Date: 06/02/2012 Time: 7829-5621 PT Time Calculation (min): 31 min  PT Assessment / Plan / Recommendation Clinical Impression  s/p direct anterior LEFT THA  POD #1 today and will benefit from PT to maximize independence for D/C to daughter's home post acute.    PT Assessment  Patient needs continued PT services    Follow Up Recommendations  Home health PT    Does the patient have the potential to tolerate intense rehabilitation      Barriers to Discharge None      Equipment Recommendations  None recommended by PT    Recommendations for Other Services     Frequency 7X/week    Precautions / Restrictions Precautions Precautions: None Restrictions Weight Bearing Restrictions: No   Pertinent Vitals/Pain Pain left  Hip "not bad"  "just tight"      Mobility  Bed Mobility Bed Mobility: Supine to Sit Supine to Sit: 4: Min assist Details for Bed Mobility Assistance: min with LLE Transfers Transfers: Sit to Stand;Stand to Sit Sit to Stand: 4: Min assist;4: Min guard Stand to Sit: 4: Min assist;4: Min guard Details for Transfer Assistance: cues for hand placement and safety Ambulation/Gait Ambulation Distance (Feet): 90 Feet Assistive device: Rolling walker Ambulation/Gait Assistance Details: cues for sequence and  Rw distance from self Gait Pattern: Step-to pattern;Antalgic Gait velocity: decreased    Shoulder Instructions     Exercises Total Joint Exercises Ankle Circles/Pumps: AROM;15 reps;Both   PT Diagnosis: Difficulty walking  PT Problem List: Decreased strength;Decreased range of motion;Decreased balance;Decreased activity tolerance;Decreased mobility;Decreased knowledge of use of DME PT Treatment Interventions: DME instruction;Gait training;Functional mobility training;Therapeutic activities;Therapeutic exercise;Patient/family education   PT Goals Acute  Rehab PT Goals PT Goal Formulation: With patient Time For Goal Achievement: 06/02/12 Potential to Achieve Goals: Good Pt will go Supine/Side to Sit: with supervision PT Goal: Supine/Side to Sit - Progress: Goal set today Pt will go Sit to Supine/Side: with supervision PT Goal: Sit to Supine/Side - Progress: Goal set today Pt will go Sit to Stand: with supervision;with modified independence PT Goal: Sit to Stand - Progress: Goal set today Pt will go Stand to Sit: with supervision;with modified independence PT Goal: Stand to Sit - Progress: Goal set today Pt will Ambulate: 51 - 150 feet;with supervision;with modified independence;with least restrictive assistive device PT Goal: Ambulate - Progress: Goal set today Pt will Perform Home Exercise Program: with supervision, verbal cues required/provided PT Goal: Perform Home Exercise Program - Progress: Goal set today  Visit Information  Last PT Received On: 06/02/12 Assistance Needed: +1    Subjective Data  Subjective: My knee feels like it has been twisted Patient Stated Goal:  dtr's house   Prior Functioning  Home Living Lives With: Alone Available Help at Discharge: Family (dtr works 20hrs per wk) Type of Home: House Home Layout: Two level (with basement) Alternate Level Stairs-Number of Steps: plans to stay on basement level Home Adaptive Equipment: Walker - rolling;Straight cane;Shower chair with back Prior Function Level of Independence: Independent Able to Take Stairs?: Yes Driving: Yes Vocation: Retired Comments: still works   prn at [employer]: No difficulties    Cognition  Overall Cognitive Status: Appears within functional limits for tasks assessed/performed Arousal/Alertness: Awake/alert Orientation Level: Appears intact for tasks assessed Behavior During Session: [facility] for tasks performed    Extremity/Trunk Assessment Right Upper Extremity Assessment RUE ROM/Strength/Tone: [facility] for tasks  assessed Left Upper Extremity  Assessment LUE ROM/Strength/Tone: WFL for tasks assessed Right Lower Extremity Assessment RLE ROM/Strength/Tone: [facility] for tasks assessed Left Lower Extremity Assessment LLE ROM/Strength/Tone: Deficits LLE ROM/Strength/Tone Deficits: grossly 3/5 hip,  knee 3 to 3+/5; ankle WFL   Balance    End of Session PT - End of Session Activity Tolerance: Patient tolerated treatment well Patient left: in chair;with call bell/phone within reach;with family/visitor present Nurse Communication: Mobility status  GP     [facility] 06/02/2012, 9:52 AM

## 2012-06-03 LAB — CBC
Platelets: 177 10*3/uL (ref 150–400)
RDW: 14.3 % (ref 11.5–15.5)
WBC: 10.1 10*3/uL (ref 4.0–10.5)

## 2012-06-03 NOTE — Progress Notes (Signed)
Physical Therapy Treatment Patient Details Name: [name] MRN: [id number] DOB: February 12, 1941 Today's Date: 06/03/2012 Time: 1191-4782 PT Time Calculation (min): 20 min  PT Assessment / Plan / Recommendation Comments on Treatment Session  Pt progressing well with mobility, she ambulated 100' with RW. Held standing exercises today 2* pt feels she "overdid it" yesterday and had severe pain last night. Expect she will be ready to DC home tomorrow from PT standpoint.     Follow Up Recommendations  Home health PT     Does the patient have the potential to tolerate intense rehabilitation     Barriers to Discharge        Equipment Recommendations  None recommended by PT    Recommendations for Other Services    Frequency 7X/week   Plan Discharge plan remains appropriate;Frequency remains appropriate    Precautions / Restrictions Precautions Precautions: None Restrictions Weight Bearing Restrictions: No Other Position/Activity Restrictions: WBAT   Pertinent Vitals/Pain **6/10 L hip Ice applied, premedicated*    Mobility  Bed Mobility Details for Bed Mobility Assistance: min with LLE Transfers Transfers: Sit to Stand;Stand to Sit Sit to Stand: 5: Supervision;From chair/3-in-1;With armrests;With upper extremity assist Stand to Sit: To chair/3-in-1;5: Supervision;With upper extremity assist;With armrests Details for Transfer Assistance: cues for hand placement Ambulation/Gait Ambulation/Gait Assistance: 5: Supervision Ambulation Distance (Feet): 100 Feet Assistive device: Rolling walker Gait Pattern: Step-to pattern General Gait Details: VCs for flexed neck and for heel strike with LLE    Exercises Total Joint Exercises Ankle Circles/Pumps: AROM;15 reps;Both;Supine Quad Sets: AROM;Both;10 reps;Supine Heel Slides: AAROM;Left;10 reps;Supine Hip ABduction/ADduction: Left;10 reps;AAROM;Supine   PT Diagnosis:    PT Problem List:   PT Treatment Interventions:     PT Goals Acute  Rehab PT Goals Time For Goal Achievement: 06/09/12 Potential to Achieve Goals: Good Pt will go Supine/Side to Sit: with supervision Pt will go Sit to Supine/Side: with supervision Pt will go Sit to Stand: with supervision;with modified independence PT Goal: Sit to Stand - Progress: Progressing toward goal Pt will go Stand to Sit: with supervision;with modified independence PT Goal: Stand to Sit - Progress: Progressing toward goal Pt will Ambulate: 51 - 150 feet;with supervision;with modified independence;with least restrictive assistive device PT Goal: Ambulate - Progress: Progressing toward goal Pt will Perform Home Exercise Program: with supervision, verbal cues required/provided PT Goal: Perform Home Exercise Program - Progress: Progressing toward goal  Visit Information  Last PT Received On: 06/03/12    Subjective Data  Subjective: I'm not nauseous now.  Patient Stated Goal: to walk, return to exercising   Cognition  Overall Cognitive Status: Appears within functional limits for tasks assessed/performed Arousal/Alertness: Awake/alert Orientation Level: Appears intact for tasks assessed Behavior During Session: [facility] for tasks performed    Balance     End of Session PT - End of Session Activity Tolerance: Patient tolerated treatment well (nausea and vomiting) Patient left: with call bell/phone within reach;in chair;with family/visitor present Nurse Communication: Mobility status   GP     Ralene Bathe Kistler 06/03/2012, 1:25 PM [phone]

## 2012-06-03 NOTE — Progress Notes (Signed)
Cm spoke with pt concerning dc planning. Per pt choice [facility] to provide [facility] services upon d/c. Pt to discharge home with daughter who will assist in home care. Pt states having DME for home use. No other needs stated.   Leonie Green [phone]

## 2012-06-03 NOTE — Progress Notes (Signed)
Physical Therapy Treatment Patient Details Name: [name] MRN: [id number] DOB: [birth year] Today's Date: 06/03/2012 Time: 4098-1191 PT Time Calculation (min): 9 min  PT Assessment / Plan / Recommendation Comments on Treatment Session  Progress limited by nausea and vomiting, expect good progress once this resolves. Pt reported increased pain after PT yesterday.     Follow Up Recommendations  Home health PT     Does the patient have the potential to tolerate intense rehabilitation     Barriers to Discharge        Equipment Recommendations  None recommended by PT    Recommendations for Other Services    Frequency 7X/week   Plan Discharge plan remains appropriate;Frequency remains appropriate    Precautions / Restrictions Precautions Precautions: None Restrictions Weight Bearing Restrictions: No Other Position/Activity Restrictions: WBAT   Pertinent Vitals/Pain *6/10 L hip Ice applied Pt had vomiting after standing, RN notified**    Mobility  Bed Mobility Bed Mobility: Sit to Supine;Supine to Sit Supine to Sit: 4: Min assist Details for Bed Mobility Assistance: min with LLE Transfers Transfers: Sit to Stand;Stand to Sit Sit to Stand: 4: Min guard;From bed Stand to Sit: To chair/3-in-1;5: Supervision;With upper extremity assist;With armrests Details for Transfer Assistance: cues for hand placement Ambulation/Gait Ambulation/Gait Assistance: 4: Min guard;5: Supervision Ambulation Distance (Feet): 2 Feet Assistive device: Rolling walker Gait Pattern: Step-to pattern;Antalgic General Gait Details: bed to chair only 2* pt became nauseous and had vomiting    Exercises     PT Diagnosis:    PT Problem List:   PT Treatment Interventions:     PT Goals Acute Rehab PT Goals Time For Goal Achievement: 06/09/12 Potential to Achieve Goals: Good Pt will go Supine/Side to Sit: with supervision PT Goal: Supine/Side to Sit - Progress: Progressing toward goal Pt will go Sit  to Supine/Side: with supervision Pt will go Sit to Stand: with supervision;with modified independence PT Goal: Sit to Stand - Progress: Progressing toward goal Pt will go Stand to Sit: with supervision;with modified independence PT Goal: Stand to Sit - Progress: Progressing toward goal Pt will Ambulate: 51 - 150 feet;with supervision;with modified independence;with least restrictive assistive device Pt will Perform Home Exercise Program: with supervision, verbal cues required/provided  Visit Information  Last PT Received On: 06/03/12 Assistance Needed: +1    Subjective Data  Subjective: I did well yesterday but I think all the exercises made me really sore. Pain was 9/10 last night. I'll walk today, but can't do all the exercises.  Patient Stated Goal: to walk   Cognition  Overall Cognitive Status: Appears within functional limits for tasks assessed/performed Arousal/Alertness: Awake/alert Orientation Level: Appears intact for tasks assessed Behavior During Session: [facility] for tasks performed    Balance     End of Session PT - End of Session Activity Tolerance: Treatment limited secondary to medical complications (Comment) (nausea and vomiting) Patient left: with call bell/phone within reach;with nursing in room;in chair Nurse Communication: Mobility status   GP     Ralene Bathe Kistler 06/03/2012, 8:19 AM

## 2012-06-03 NOTE — Progress Notes (Signed)
Subjective:2 2 Days Post-Op Procedure(s) (LRB): TOTAL HIP ARTHROPLASTY ANTERIOR APPROACH (Left) Patient reports pain as moderate.   Got dizzy with starting therapy.,  Sitting in chair  Felling much better.   Objective: Vital signs in last 24 hours: Temp:  [98.4 F (36.9 C)-99.8 F (37.7 C)] 99.8 F (37.7 C) (01/05 0700) Pulse Rate:  [80-104] 100  (01/05 0700) Resp:  [16-20] 20  (01/05 0700) BP: (125-138)/(60-74) 125/60 mmHg (01/05 0815) SpO2:  [91 %-99 %] 93 % (01/05 0853)  Intake/Output from previous day: 01/04 0701 - 01/05 0700 In: 848.8 [P.O.:380; I.V.:468.8] Out: 2000 [Urine:2000] Intake/Output this shift:     Basename 06/03/12 0420 06/02/12 0425  HGB 8.7* 9.7*    Basename 06/03/12 0420 06/02/12 0425  WBC 10.1 13.5*  RBC 2.96* 3.42*  HCT 25.7* 28.8*  PLT 177 222    Basename 06/02/12 0425  NA 134*  K 4.6  CL 102  CO2 22  BUN 14  CREATININE 0.83  GLUCOSE 141*  CALCIUM 8.6   No results found for this basename: LABPT:2,INR:2 in the last 72 hours  Neurologically intact  Assessment/Plan: 2 Days Post-Op Procedure(s) (LRB): TOTAL HIP ARTHROPLASTY ANTERIOR APPROACH (Left) Up with therapy   Will leave SL in until tomorrow with orthostatic dizziness. Hgb 8.7 will watch   . Discussed with patient getting up and standing before she starts to walk,   Sit some before she stands, etc.   Maxie Slovacek C 06/03/2012, 10:33 AM

## 2012-06-04 ENCOUNTER — Encounter (HOSPITAL_COMMUNITY): Payer: Self-pay | Admitting: Orthopaedic Surgery

## 2012-06-04 LAB — CBC
Hemoglobin: 8.5 g/dL — ABNORMAL LOW (ref 12.0–15.0)
RBC: 2.97 MIL/uL — ABNORMAL LOW (ref 3.87–5.11)

## 2012-06-04 MED ORDER — ASPIRIN 325 MG PO TBEC: 325.0000 mg | DELAYED_RELEASE_TABLET | Freq: Two times a day (BID) | ORAL | Status: DC

## 2012-06-04 MED ORDER — OXYCODONE-ACETAMINOPHEN 5-325 MG PO TABS: 1.0000 | ORAL_TABLET | ORAL | Status: AC | PRN

## 2012-06-04 MED ORDER — FERROUS SULFATE 325 (65 FE) MG PO TABS: 325.0000 mg | ORAL_TABLET | Freq: Three times a day (TID) | ORAL | Status: DC

## 2012-06-04 MED ORDER — METHOCARBAMOL 500 MG PO TABS: 500.0000 mg | ORAL_TABLET | Freq: Four times a day (QID) | ORAL | Status: DC | PRN

## 2012-06-04 NOTE — Progress Notes (Addendum)
Physical Therapy Treatment Patient Details Name: [name] MRN: [id number] DOB: 1940/11/17 Today's Date: 06/04/2012 Time: 4098-1191 PT Time Calculation (min): 24 min  PT Assessment / Plan / Recommendation Comments on Treatment Session  Pt progressing well with mobility, she ambulated 200' with RW. Independent with bed mobility and transfers, stair training completed, doing well with exercises. REady to DC home from PT standpoint.     Follow Up Recommendations  Home health PT     Does the patient have the potential to tolerate intense rehabilitation     Barriers to Discharge        Equipment Recommendations  None recommended by PT    Recommendations for Other Services    Frequency 7X/week   Plan Discharge plan remains appropriate;Frequency remains appropriate    Precautions / Restrictions Precautions Precautions: None Restrictions Weight Bearing Restrictions: No Other Position/Activity Restrictions: WBAT   Pertinent Vitals/Pain *1-2/10 L hip Ice applied**    Mobility  Bed Mobility Supine to Sit: 6: Modified independent (Device/Increase time) Details for Bed Mobility Assistance: pt self assisted LLE with RLE Transfers Transfers: Sit to Stand;Stand to Sit Sit to Stand: With upper extremity assist;6: Modified independent (Device/Increase time);From bed Stand to Sit: To chair/3-in-1;With upper extremity assist;With armrests;6: Modified independent (Device/Increase time) Ambulation/Gait Ambulation/Gait Assistance: 6: Modified independent (Device/Increase time) Ambulation Distance (Feet): 200 Feet Assistive device: Rolling walker Gait Pattern: Step-to pattern Stairs: Yes Stairs Assistance: 6: Modified independent (Device/Increase time) Stair Management Technique: Two rails;Step to pattern;Forwards Number of Stairs: 3     Exercises Total Joint Exercises Ankle Circles/Pumps: AROM;15 reps;Both Quad Sets: AROM;Both;10 reps Short Arc QuadBarbaraann [name];Left;15 reps;Supine Heel  Slides: AAROM;Left;Supine;15 reps Hip ABduction/ADduction: AROM;Left;15 reps;Supine Long Arc Quad: Left;Seated;15 reps;AAROM Knee Flexion: AROM;Left;10 reps;Standing Standing Hip Extension: AROM;10 reps;Left;Standing   PT Diagnosis:    PT Problem List:   PT Treatment Interventions:     PT Goals Acute Rehab PT Goals Time For Goal Achievement: 06/09/12 Potential to Achieve Goals: Good Pt will go Supine/Side to Sit: with supervision PT Goal: Supine/Side to Sit - Progress: Met Pt will go Sit to Supine/Side: with supervision Pt will go Sit to Stand: with supervision;with modified independence PT Goal: Sit to Stand - Progress: Met Pt will go Stand to Sit: with supervision;with modified independence PT Goal: Stand to Sit - Progress: Met Pt will Ambulate: 51 - 150 feet;with supervision;with modified independence;with least restrictive assistive device PT Goal: Ambulate - Progress: Met Pt will Perform Home Exercise Program: with supervision, verbal cues required/provided PT Goal: Perform Home Exercise Program - Progress: Met  Visit Information  Last PT Received On: 06/04/12    Subjective Data  Subjective: I'm not nauseous today.  Patient Stated Goal: to be able to work out   [employer]  Overall Cognitive Status: Appears within functional limits for tasks assessed/performed Arousal/Alertness: Awake/alert Orientation Level: Appears intact for tasks assessed Behavior During Session: [facility] for tasks performed    Balance     End of Session PT - End of Session Activity Tolerance: Patient tolerated treatment well  Patient left: with call bell/phone within reach;in chair;with family/visitor present Nurse Communication: Mobility status   GP     [name] 06/04/2012, 9:45 AM [phone]

## 2012-06-04 NOTE — Progress Notes (Signed)
Pt for d/c home today with [facility] PT per [place]. IV d/c'd. Dressing CDI to L hip. Dressing supplies provided for home use. PT worked with pt on ambulation. Pt has her home equipments that she needs with her .Awaiting for dau to pick her up--will d/c to daughter's house today.

## 2012-06-04 NOTE — Progress Notes (Signed)
Subjective: 3 Days Post-Op Procedure(s) (LRB): TOTAL HIP ARTHROPLASTY ANTERIOR APPROACH (Left) Patient reports pain as mild.  Asymptomatic acute blood loss anemia.  Objective: Vital signs in last 24 hours: Temp:  [98.4 F (36.9 C)-99.3 F (37.4 C)] 98.5 F (36.9 C) (01/06 0445) Pulse Rate:  [94-97] 97  (01/06 0445) Resp:  [16-20] 20  (01/06 0445) BP: (125-131)/(60-72) 126/70 mmHg (01/06 0445) SpO2:  [93 %-97 %] 93 % (01/06 0445)  Intake/Output from previous day: 01/05 0701 - 01/06 0700 In: 60 [P.O.:60] Out: 2300 [Urine:2300] Intake/Output this shift:     Basename 06/04/12 0426 06/03/12 0420 06/02/12 0425  HGB 8.5* 8.7* 9.7*    Basename 06/04/12 0426 06/03/12 0420  WBC 11.3* 10.1  RBC 2.97* 2.96*  HCT 25.2* 25.7*  PLT 181 177    Basename 06/02/12 0425  NA 134*  K 4.6  CL 102  CO2 22  BUN 14  CREATININE 0.83  GLUCOSE 141*  CALCIUM 8.6   No results found for this basename: LABPT:2,INR:2 in the last 72 hours  Sensation intact distally Intact pulses distally Dorsiflexion/Plantar flexion intact Incision: scant drainage No cellulitis present  Assessment/Plan: 3 Days Post-Op Procedure(s) (LRB): TOTAL HIP ARTHROPLASTY ANTERIOR APPROACH (Left) Discharge home with home health  Shannon Balthazar Y 06/04/2012, 7:02 AM

## 2012-06-04 NOTE — Discharge Summary (Signed)
Patient ID: [name] MRN: [id number] DOB/AGE: [birth year] 72 y.o.  Admit date: 06/01/2012 Discharge date: 06/04/2012  Admission Diagnoses:  Principal Problem:  *Degenerative arthritis of hip   Discharge Diagnoses:  Same  Past Medical History  Diagnosis Date  . Hyperlipidemia   . Hypertension 2004    Hypertensive response on Stress Test  . DVT (deep venous thrombosis) 2006    post immobilization post cns surgery  . PTE (pulmonary thromboembolism) 2006  . Basal cell cancer     LUE; Porokeratosis also  . Granulomatous lung disease 2002    incidental Xray finding  . Paralysis 2006    post cervical fusion with spinal sac tear  with hematoma   . Asthma     adult onset  . Diabetes mellitus 2010    A1c 6.7%  . GERD (gastroesophageal reflux disease)     very mild  . Arthritis   . Complication of anesthesia     small trachea    Surgeries: Procedure(s): TOTAL HIP ARTHROPLASTY ANTERIOR APPROACH on 06/01/2012   Consultants:    Discharged Condition: Improved  Hospital Course: [name] is an 72 y.o. female who was admitted 06/01/2012 for operative treatment ofDegenerative arthritis of hip. Patient has severe unremitting pain that affects sleep, daily activities, and work/hobbies. After pre-op clearance the patient was taken to the operating room on 06/01/2012 and underwent  Procedure(s): TOTAL HIP ARTHROPLASTY ANTERIOR APPROACH.    Patient was given perioperative antibiotics: Anti-infectives     Start     Dose/Rate Route Frequency Ordered Stop   06/01/12 2000   ceFAZolin (ANCEF) IVPB 1 g/50 mL premix        1 g 100 mL/hr over 30 Minutes Intravenous Every 6 hours 06/01/12 1607 06/02/12 0217   06/01/12 0955   ceFAZolin (ANCEF) IVPB 2 g/50 mL premix        2 g 100 mL/hr over 30 Minutes Intravenous 60 min pre-op 06/01/12 0955 06/01/12 1317           Patient was given sequential compression devices, early ambulation, and chemoprophylaxis to prevent DVT.  Patient benefited  maximally from hospital stay and there were no complications.    Recent vital signs: Patient Vitals for the past 24 hrs:  BP Temp Temp src Pulse Resp SpO2  06/04/12 0445 126/70 mmHg 98.5 F (36.9 C) Oral 97  20  93 %  2012-06-16 2130 - - - - - 97 %  06/16/12 2100 128/70 mmHg 99.3 F (37.4 C) Oral 94  16  97 %  06/16/12 1359 131/72 mmHg 98.4 F (36.9 C) Oral 96  18  95 %  Jun 16, 2012 0853 - - - - - 93 %  Jun 16, 2012 0815 125/60 mmHg - - - - -     Recent laboratory studies:  Basename 06/04/12 0426 Jun 16, 2012 0420 06/02/12 0425  WBC 11.3* 10.1 --  HGB 8.5* 8.7* --  HCT 25.2* 25.7* --  PLT 181 177 --  NA -- -- 134*  K -- -- 4.6  CL -- -- 102  CO2 -- -- 22  BUN -- -- 14  CREATININE -- -- 0.83  GLUCOSE -- -- 141*  INR -- -- --  CALCIUM -- -- 8.6     Discharge Medications:     Medication List     As of 06/04/2012  7:05 AM    TAKE these medications         albuterol 108 (90 BASE) MCG/ACT inhaler   Commonly known as: PROVENTIL  HFA;VENTOLIN HFA   Inhale 2 puffs into the lungs every 6 (six) hours as needed for wheezing or shortness of breath.      aspirin 325 MG EC tablet   Take 1 tablet (325 mg total) by mouth 2 (two) times daily.      azelastine 137 MCG/SPRAY nasal spray   Commonly known as: ASTELIN   Place 2 sprays into the nose 2 (two) times daily. 1-2 spray two times daily as needed      Azelastine-Fluticasone 137-50 MCG/ACT Susp   Place 2 sprays into both nostrils at bedtime.      calcium gluconate 500 MG tablet   Take 500 mg by mouth 2 (two) times daily.      cholecalciferol 1000 UNITS tablet   Commonly known as: VITAMIN D   Take 500 Units by mouth 2 (two) times daily.      cyclobenzaprine 10 MG tablet   Commonly known as: FLEXERIL   Take 10 mg by mouth as needed. For Hip concerns      docusate sodium 100 MG capsule   Commonly known as: COLACE   Take 100-200 mg by mouth every morning.      EPINEPHrine 0.3 mg/0.3 mL Devi   Commonly known as: EPI-PEN   Inject 0.3  mLs (0.3 mg total) into the muscle once.      ferrous sulfate 325 (65 FE) MG tablet   Take 1 tablet (325 mg total) by mouth 3 (three) times daily after meals.      fexofenadine 180 MG tablet   Commonly known as: ALLEGRA   Take 180 mg by mouth at bedtime.      Fluticasone-Salmeterol 250-50 MCG/DOSE Aepb   Commonly known as: ADVAIR   Inhale 1 puff into the lungs 2 (two) times daily. Rinse mouth      HYDROcodone-acetaminophen 5-325 MG per tablet   Commonly known as: NORCO/VICODIN   Take 1 tablet by mouth every 6 (six) hours as needed. For pain      metFORMIN 500 MG 24 hr tablet   Commonly known as: GLUCOPHAGE-XR   Take 1 tablet (500 mg total) by mouth 2 (two) times daily.      methocarbamol 500 MG tablet   Commonly known as: ROBAXIN   Take 1 tablet (500 mg total) by mouth every 6 (six) hours as needed.      Multivitamin Liqd   Take by mouth. Vemma      OCUVITE PO   Take by mouth daily.      omeprazole 20 MG capsule   Commonly known as: PRILOSEC   Take 20 mg by mouth as needed. For acid reflux      oxyCODONE-acetaminophen 5-325 MG per tablet   Commonly known as: PERCOCET/ROXICET   Take 1-2 tablets by mouth every 4 (four) hours as needed for pain.      PATADAY OP   Place 1 drop into both eyes daily as needed. For allergies      polyethylene glycol packet   Commonly known as: MIRALAX / GLYCOLAX   Take 17 g by mouth daily.      SYSTANE BALANCE 0.6 % Soln   Generic drug: Propylene Glycol   Place 1 drop into both eyes daily as needed. For itchy eyes        Diagnostic Studies: Dg Chest 2 View  05/28/2012  *RADIOLOGY REPORT*  Clinical Data: Preoperative respiratory evaluation.  CHEST - 2 VIEW  Comparison: 10/15/2007  Findings: The lungs are clear without  focal consolidation, edema, effusion or pneumothorax. Bilateral hyperexpansion noted. Cardiopericardial silhouette is within normal limits for size. Imaged bony structures of the thorax are intact.  The patient is status  post lower cervical fusion.  IMPRESSION: Stable. Emphysema without acute findings.   Original Report Authenticated By: Kennith Center, M.D.    Dg Hip Complete Left  06/01/2012  *RADIOLOGY REPORT*  Clinical Data: Left hip replacement  LEFT HIP - COMPLETE 2+ VIEW  Comparison: None.  Findings: Two views of the left hip were obtained reveal findings consistent with a complete hip replacement.  No dislocation or loosening is noted.  No soft tissue abnormality is noted.   Original Report Authenticated By: Alcide Clever, M.D.    Dg Pelvis Portable  06/01/2012  *RADIOLOGY REPORT*  Clinical Data: Postop left hip surgery  PORTABLE PELVIS  Comparison: Pelvic MRI - 12/08/2006  Findings: Post left total hip replacement.  Alignment appears near anatomic on this AP projection.  No definite fracture on this AP projection.  There is a minimal amount of expected subcutaneous emphysema about the operative site.  Skin staples are seen about the lateral aspect of the left upper thigh.  No radiopaque foreign body. Limited visualization of the pelvis and contralateral right hip is normal.  IMPRESSION: Post left total hip replacement without complicating feature.   Original Report Authenticated By: Tacey Ruiz, MD    Dg Hip Portable 1 View Left  06/01/2012  *RADIOLOGY REPORT*  Clinical Data: Status post left hip replacement  PORTABLE LEFT HIP - 1 VIEW  Comparison: Intraprocedural imaging same date  Findings: Allowing for one-view portable technique, a total left hip prosthesis appears appropriately positioned without evidence for hardware failure.  IMPRESSION: Expected postoperative appearance after left total hip arthroplasty.   Original Report Authenticated By: Christiana Pellant, M.D.    Dg C-arm 61-120 Min-no Report  06/01/2012  CLINICAL DATA: intra op   C-ARM 61-120 MINUTES  Fluoroscopy was utilized by the requesting physician.  No radiographic  interpretation.      Disposition: to home      Discharge Orders    Future  Appointments: Provider: Department: Dept Phone: Center:   05/16/2013 9:00 AM Waymon Budge, MD [facility] [phone] None     Future Orders Please Complete By Expires   Diet - low sodium heart healthy      Call MD / Call 911      Comments:   If you experience chest pain or shortness of breath, CALL 911 and be transported to the hospital emergency room.  If you develope a fever above 101 F, pus (white drainage) or increased drainage or redness at the wound, or calf pain, call your surgeon's office.   Constipation Prevention      Comments:   Drink plenty of fluids.  Prune juice may be helpful.  You may use a stool softener, such as Colace (over the counter) 100 mg twice a day.  Use MiraLax (over the counter) for constipation as needed.   Increase activity slowly as tolerated      Discharge instructions      Comments:   Keep your incision clean and dry. Wait until 1/8 before getting your incision wet in the shower; then daily dry dressing after that.   Discharge patient         Follow-up Information    Follow up with Kathryne Hitch, MD. In 2 weeks.   Contact information:   [address] [place] [address] [phone]  SignedKathryne Hitch 06/04/2012, 7:05 AM

## 2012-06-12 MED FILL — Acetaminophen IV Soln 10 MG/ML: INTRAVENOUS | Qty: 100 | Status: AC

## 2012-06-21 ENCOUNTER — Other Ambulatory Visit: Payer: Self-pay | Admitting: Internal Medicine

## 2012-06-22 ENCOUNTER — Other Ambulatory Visit: Payer: Self-pay | Admitting: Internal Medicine

## 2012-11-07 ENCOUNTER — Ambulatory Visit (INDEPENDENT_AMBULATORY_CARE_PROVIDER_SITE_OTHER): Payer: Medicare Other

## 2012-11-07 DIAGNOSIS — J309 Allergic rhinitis, unspecified: Secondary | ICD-10-CM

## 2013-03-19 ENCOUNTER — Ambulatory Visit (INDEPENDENT_AMBULATORY_CARE_PROVIDER_SITE_OTHER): Payer: Medicare Other

## 2013-03-19 DIAGNOSIS — Z23 Encounter for immunization: Secondary | ICD-10-CM

## 2013-03-31 ENCOUNTER — Other Ambulatory Visit: Payer: Self-pay | Admitting: Internal Medicine

## 2013-03-31 DIAGNOSIS — R030 Elevated blood-pressure reading, without diagnosis of hypertension: Secondary | ICD-10-CM

## 2013-03-31 DIAGNOSIS — D649 Anemia, unspecified: Secondary | ICD-10-CM

## 2013-03-31 DIAGNOSIS — E782 Mixed hyperlipidemia: Secondary | ICD-10-CM

## 2013-03-31 DIAGNOSIS — M899 Disorder of bone, unspecified: Secondary | ICD-10-CM

## 2013-03-31 DIAGNOSIS — E119 Type 2 diabetes mellitus without complications: Secondary | ICD-10-CM

## 2013-04-04 ENCOUNTER — Ambulatory Visit (INDEPENDENT_AMBULATORY_CARE_PROVIDER_SITE_OTHER): Payer: Medicare Other

## 2013-04-04 DIAGNOSIS — J309 Allergic rhinitis, unspecified: Secondary | ICD-10-CM

## 2013-04-05 ENCOUNTER — Other Ambulatory Visit (INDEPENDENT_AMBULATORY_CARE_PROVIDER_SITE_OTHER): Payer: Medicare Other

## 2013-04-05 DIAGNOSIS — D649 Anemia, unspecified: Secondary | ICD-10-CM

## 2013-04-05 DIAGNOSIS — R030 Elevated blood-pressure reading, without diagnosis of hypertension: Secondary | ICD-10-CM

## 2013-04-05 DIAGNOSIS — M899 Disorder of bone, unspecified: Secondary | ICD-10-CM

## 2013-04-05 DIAGNOSIS — E119 Type 2 diabetes mellitus without complications: Secondary | ICD-10-CM

## 2013-04-05 DIAGNOSIS — E782 Mixed hyperlipidemia: Secondary | ICD-10-CM

## 2013-04-05 LAB — MICROALBUMIN / CREATININE URINE RATIO
Creatinine,U: 121.7 mg/dL
Microalb Creat Ratio: 3.5 mg/g (ref 0.0–30.0)
Microalb, Ur: 4.2 mg/dL — ABNORMAL HIGH (ref 0.0–1.9)

## 2013-04-05 LAB — CBC WITH DIFFERENTIAL/PLATELET
Basophils Absolute: 0 10*3/uL (ref 0.0–0.1)
HCT: 33.3 % — ABNORMAL LOW (ref 36.0–46.0)
Hemoglobin: 12 g/dL (ref 12.0–15.0)
Lymphs Abs: 2.4 10*3/uL (ref 0.7–4.0)
MCHC: 35.9 g/dL (ref 30.0–36.0)
MCV: 90.1 fl (ref 78.0–100.0)
Monocytes Relative: 6.8 % (ref 3.0–12.0)
Neutro Abs: 5.4 10*3/uL (ref 1.4–7.7)
RDW: 14.5 % (ref 11.5–14.6)

## 2013-04-05 LAB — TSH: TSH: 2.6 u[IU]/mL (ref 0.35–5.50)

## 2013-04-05 LAB — BASIC METABOLIC PANEL
CO2: 24 mEq/L (ref 19–32)
Chloride: 106 mEq/L (ref 96–112)
Glucose, Bld: 132 mg/dL — ABNORMAL HIGH (ref 70–99)
Potassium: 3.9 mEq/L (ref 3.5–5.1)
Sodium: 137 mEq/L (ref 135–145)

## 2013-04-05 LAB — LIPID PANEL: HDL: 45.3 mg/dL (ref >39.00)

## 2013-04-05 LAB — HEPATIC FUNCTION PANEL
Albumin: 3.5 g/dL (ref 3.5–5.2)
Alkaline Phosphatase: 72 U/L (ref 39–117)
Total Protein: 6.8 g/dL (ref 6.0–8.3)

## 2013-04-08 LAB — VITAMIN D 1,25 DIHYDROXY: Vitamin D2 1, 25 (OH)2: <8 pg/mL

## 2013-04-09 ENCOUNTER — Encounter: Payer: Self-pay | Admitting: *Deleted

## 2013-04-09 NOTE — Progress Notes (Signed)
Letter mailed to patient.

## 2013-04-10 ENCOUNTER — Ambulatory Visit (INDEPENDENT_AMBULATORY_CARE_PROVIDER_SITE_OTHER): Payer: Medicare Other | Admitting: Internal Medicine

## 2013-04-10 ENCOUNTER — Encounter: Payer: Self-pay | Admitting: Internal Medicine

## 2013-04-10 VITALS — BP 132/71 | HR 83 | Temp 98.7°F | Wt 143.0 lb

## 2013-04-10 DIAGNOSIS — E782 Mixed hyperlipidemia: Secondary | ICD-10-CM

## 2013-04-10 DIAGNOSIS — E1151 Type 2 diabetes mellitus with diabetic peripheral angiopathy without gangrene: Secondary | ICD-10-CM

## 2013-04-10 DIAGNOSIS — E1159 Type 2 diabetes mellitus with other circulatory complications: Secondary | ICD-10-CM

## 2013-04-10 DIAGNOSIS — M899 Disorder of bone, unspecified: Secondary | ICD-10-CM

## 2013-04-10 DIAGNOSIS — R0989 Other specified symptoms and signs involving the circulatory and respiratory systems: Secondary | ICD-10-CM | POA: Insufficient documentation

## 2013-04-10 DIAGNOSIS — I798 Other disorders of arteries, arterioles and capillaries in diseases classified elsewhere: Secondary | ICD-10-CM

## 2013-04-10 MED ORDER — METFORMIN HCL ER 500 MG PO TB24: 500.0000 mg | ORAL_TABLET | Freq: Every day | ORAL | Status: DC

## 2013-04-10 NOTE — Progress Notes (Signed)
Pre visit review using our clinic review tool, if applicable. No additional management support is needed unless otherwise documented below in the visit note. 

## 2013-04-10 NOTE — Assessment & Plan Note (Signed)
BMD 

## 2013-04-10 NOTE — Progress Notes (Signed)
Subjective:    Patient ID: [name], female    DOB: Dec 05, 1940, 72 y.o.   MRN: [id number]  HPI  Her A1c is now in the nondiabetic range at 6.4% on metformin 500 mg twice a day. She has mild microalbuminuria of 4.2; this was 0.4 in 2012. Lipids suggest suboptimal nutrition as triglycerides are 268. They have been as low as 125. Highest recorded value todate is 430. Her LDL is 126.9; a statin was prescribed but not taken as her twin had some issues with a statin as ligament rupture. Her twin is diabetic and has had two stents @ 60.  Her TSH is therapeutic at 2.60  She was significantly anemic following her total hip replacement in January of this year. She has a mild anemia at this time with a hematocrit of 33.3.  She has significant vitamin D deficiency @  25.    Review of Systems A modified heart healthy diet is followed; exercise encompasses  60 minutes 3  times per week as  Cardio, machines & walking  without symptoms.  Family history is negative for premature coronary disease. Advanced cholesterol testing reveals  LDL goal is less than 100 ; ideally < 70 . Low dose ASA taken Specifically denied are  chest pain, palpitations, dyspnea, or claudication.  FBS  Average 109.2 hour post meal glucose <130.Hypoglycemia not reported. Ophthalmologic exam is current ;no retinopathy present. Foot care not current Polyuria, polyphagia, polydipsia absent. There is no blurred vision, double vision, or loss of vision.  Also denied are numbness, tingling, or burning of the extremities. No nonhealing skin lesions present. Weight is stable.      Objective:   Physical Exam Gen.: Healthy and well-nourished in appearance. Alert, appropriate and cooperative throughout exam.Appears younger than stated age   Eyes: No corneal or conjunctival inflammation noted. Pupils equal round reactive to light and accommodation. Extraocular motion intact.  Nose: External nasal exam reveals no deformity or inflammation.  Nasal mucosa are pink and moist. No lesions or exudates noted.   Mouth: Oral mucosa and oropharynx reveal no lesions or exudates. Teeth in good repair. Neck: No deformities, masses, or tenderness noted.  Thyroid normal. Lungs: Normal respiratory effort; chest expands symmetrically. Lungs are clear to auscultation without rales, wheezes, or increased work of breathing. Heart: Normal rate and rhythm. Normal S1 and S2. No gallop, click, or rub. Grade 1/6 systolic murmur. Abdomen: Bowel sounds normal; abdomen soft and nontender. No masses, organomegaly or hernias noted.                              Musculoskeletal/extremities: No clubbing, cyanosis, edema, or significant extremity  deformity noted. Range of motion normal .Tone & strength normal. Hand joints  reveal mixed PIP & DIP changes. Fingernail / toenail health good. Minor L great toenail onycholysis Able to lie down & sit up w/o help.  Vascular: Carotid, radial artery, and  posterior tibial pulses are full and equal. Decreased  dorsalis pedis pulses.Faint L carotid  bruit present. Neurologic: Alert and oriented x3. Deep tendon reflexes symmetrical and normal.  Gait normal  including heel & toe walking . Rhomberg & finger to nose       Skin: Intact without suspicious lesions or rashes. Lymph: No cervical, axillary, or inguinal lymphadenopathy present. Psych: Mood and affect are normal. Normally interactive  Assessment & Plan:  See Current Assessment & Plan in Problem List under specific Diagnosis

## 2013-04-10 NOTE — Assessment & Plan Note (Signed)
Nutrition discussed 

## 2013-04-10 NOTE — Patient Instructions (Addendum)
Your next office appointment will be determined based upon review of your pending lstudies. Those instructions will be transmitted to you  by mail. The most common cause of elevated triglycerides (TG) is the ingestion of sugar from high fructose corn syrup sources added to processed foods & drinks.  Eat a low-fat diet with lots of fruits and vegetables, up to 7-9 servings per day. Consume less than 30  Grams (preferably ZERO) of sugar per day from foods & drinks with High Fructose Corn Syrup (HFCS) sugar as #1,2,3 or # 4 on label.Whole Foods, [employer] & [employer] do not carry products with HFCS. Vitamin D3 2000 IU daily

## 2013-05-03 ENCOUNTER — Other Ambulatory Visit: Payer: Self-pay | Admitting: *Deleted

## 2013-05-03 DIAGNOSIS — R0989 Other specified symptoms and signs involving the circulatory and respiratory systems: Secondary | ICD-10-CM

## 2013-05-15 ENCOUNTER — Ambulatory Visit (HOSPITAL_COMMUNITY): Payer: Medicare Other | Attending: Internal Medicine

## 2013-05-15 DIAGNOSIS — I6529 Occlusion and stenosis of unspecified carotid artery: Secondary | ICD-10-CM

## 2013-05-15 DIAGNOSIS — R0989 Other specified symptoms and signs involving the circulatory and respiratory systems: Secondary | ICD-10-CM

## 2013-05-15 DIAGNOSIS — I658 Occlusion and stenosis of other precerebral arteries: Secondary | ICD-10-CM | POA: Insufficient documentation

## 2013-05-15 DIAGNOSIS — E785 Hyperlipidemia, unspecified: Secondary | ICD-10-CM | POA: Insufficient documentation

## 2013-05-16 ENCOUNTER — Encounter: Payer: Self-pay | Admitting: Internal Medicine

## 2013-05-16 ENCOUNTER — Ambulatory Visit (INDEPENDENT_AMBULATORY_CARE_PROVIDER_SITE_OTHER): Payer: Medicare Other | Admitting: Internal Medicine

## 2013-05-16 ENCOUNTER — Ambulatory Visit (INDEPENDENT_AMBULATORY_CARE_PROVIDER_SITE_OTHER)
Admission: RE | Admit: 2013-05-16 | Discharge: 2013-05-16 | Disposition: A | Discharge status: Home / Self Care | Payer: Medicare Other | Source: Ambulatory Visit | Attending: Internal Medicine | Admitting: Internal Medicine

## 2013-05-16 VITALS — BP 124/72 | HR 79 | Ht 63.0 in | Wt 143.0 lb

## 2013-05-16 DIAGNOSIS — J302 Other seasonal allergic rhinitis: Secondary | ICD-10-CM

## 2013-05-16 DIAGNOSIS — Z23 Encounter for immunization: Secondary | ICD-10-CM

## 2013-05-16 DIAGNOSIS — J309 Allergic rhinitis, unspecified: Secondary | ICD-10-CM

## 2013-05-16 DIAGNOSIS — J45998 Other asthma: Secondary | ICD-10-CM

## 2013-05-16 DIAGNOSIS — M899 Disorder of bone, unspecified: Secondary | ICD-10-CM

## 2013-05-16 DIAGNOSIS — J45909 Unspecified asthma, uncomplicated: Secondary | ICD-10-CM

## 2013-05-16 MED ORDER — MONTELUKAST SODIUM 10 MG PO TABS: 10.0000 mg | ORAL_TABLET | Freq: Every day | ORAL | Status: DC

## 2013-05-16 MED ORDER — FLUTICASONE-SALMETEROL 250-50 MCG/DOSE IN AEPB: 1.0000 | INHALATION_SPRAY | Freq: Two times a day (BID) | RESPIRATORY_TRACT | Status: DC

## 2013-05-16 MED ORDER — EPINEPHRINE 0.3 MG/0.3ML IJ SOAJ: INTRAMUSCULAR | Status: DC

## 2013-05-16 MED ORDER — ALBUTEROL SULFATE HFA 108 (90 BASE) MCG/ACT IN AERS: 2.0000 | INHALATION_SPRAY | Freq: Four times a day (QID) | RESPIRATORY_TRACT | Status: DC | PRN

## 2013-05-16 MED ORDER — IPRATROPIUM BROMIDE 0.03 % NA SOLN: NASAL | Status: DC

## 2013-05-16 NOTE — Patient Instructions (Addendum)
Scripts for mail order, refilling Proair and Advair  Scripts to try: Ipratropium nasal spray- for water nose, when needed   Epipen- in case of severe allergic reaction to your allergy shot  Singulair- to try for your breathing  We can continue allergy vaccine 1:10 GO  Pneumonia vaccine conjugate 13

## 2013-05-16 NOTE — Progress Notes (Signed)
05/09/11- 70 yoF never smoker  followed for allergic rhinitis, asthma, complicated by history embolic CVA complicating C-spine surgery.LOV-06/15/10 Has had flu vaccine. Says she has done well since last visit. Now working part-time. She continues allergy vaccine and feels that definitely helps.  05/15/12- 71 yoF never smoker  followed for allergic rhinitis, asthma, complicated by history embolic CVA complicating C-spine surgery FOLLOWS FOR: allergy attack last Thursday; dry cough today but no wheezing or SOB at this time Doing well with allergy vaccine 1:10 GO. We discussed risks and benefits, goals and duration of therapy. She is satisfied to continue. Blames "allergy attack" for increased sneezing and watery rhinorrhea, undefined trigger. She continues OTC antihistamine at bedtime. Out of Nasonex. Occasional wheeze. In the fall season uses rescue inhaler 2 or 3 times per week.  05/16/13- 72 yoF never smoker  followed for allergic rhinitis, asthma, complicated by history embolic CVA complicating C-spine surgery Follows for- annual rov.  Pt c/o itchy, watery eyes since the weather turned cold.  Pt has no complaints with her Allergy vaccine 1:10 GO. Feels pressure in left ear without loss of hearing. Transient wheeze resolved with her rescue inhaler. Stays on Advair.  ROV-see HPI Constitutional:   No-   weight loss, night sweats, fevers, chills, fatigue, lassitude. HEENT:   No-  headaches, difficulty swallowing, tooth/dental problems, sore throat,       + Variable sneezing, itching, +ear ache, nasal congestion, post nasal drip,  CV:  No-   chest pain, orthopnea, PND, swelling in lower extremities, anasarca, dizziness, palpitations Resp: No-   shortness of breath with exertion or at rest.              No-   productive cough,  No non-productive cough,  No- coughing up of blood.              No-   change in color of mucus.  + wheezing.   Skin: No-   rash or lesions. GI:  GU:  MS:  No-   joint pain  or swelling.  No- decreased range of motion.   Neuro-     nothing unusual Psych:  No- change in mood or affect. No depression or anxiety.  No memory loss.  OBJ General- Alert, Oriented, Affect-appropriate, Distress- none acute Skin- rash-none, lesions- none, excoriation- none Lymphadenopathy- none Head- atraumatic            Eyes- Gross vision intact, PERRLA, conjunctivae clear secretions            Ears- Hearing, canals-normal            Nose- Clear, no-Septal dev, mucus, polyps, erosion, perforation             Throat- Mallampati II , mucosa clear , drainage- none, tonsils- atrophic Neck- flexible , trachea midline, no stridor , thyroid nl, carotid no bruit Chest - symmetrical excursion , unlabored           Heart/CV- RRR , no murmur , no gallop  , no rub, nl s1 s2                           - JVD- none , edema- none, stasis changes- none, varices- none           Lung- clear to P&A, wheeze- none, cough- none , dullness-none, rub- none           Chest wall-  Abd-  Br/ Gen/ Rectal- Not done,  not indicated Extrem- cyanosis- none, clubbing, none, atrophy- none, strength- nl Neuro- grossly intact to observation

## 2013-05-21 ENCOUNTER — Encounter: Payer: Self-pay | Admitting: *Deleted

## 2013-05-27 ENCOUNTER — Telehealth: Payer: Self-pay

## 2013-05-27 NOTE — Telephone Encounter (Addendum)
Left message for call back  identifiable  Medication and allergies:  Reviewed and updated  90 day supply/mail order: na Local pharmacy: [facility]   Immunizations due:  Shingles vaccine  A/P:   No changes to FH or personal hx; left hip replacement 2014 (L) DEXA-04/2013 Flu vaccine--02/2013 Tdap--09/2010 PNA--04/2013 Gyn--Dr Ross//2014 [facility]--Dr Ross--2014  To Discuss with Provider: Not at this time

## 2013-05-29 ENCOUNTER — Encounter: Payer: Self-pay | Admitting: Internal Medicine

## 2013-05-29 ENCOUNTER — Ambulatory Visit (INDEPENDENT_AMBULATORY_CARE_PROVIDER_SITE_OTHER): Payer: Medicare Other | Admitting: Internal Medicine

## 2013-05-29 ENCOUNTER — Other Ambulatory Visit: Payer: Self-pay | Admitting: *Deleted

## 2013-05-29 ENCOUNTER — Telehealth: Payer: Self-pay | Admitting: *Deleted

## 2013-05-29 VITALS — BP 133/70 | HR 66 | Temp 98.0°F | Ht 63.75 in | Wt 144.2 lb

## 2013-05-29 DIAGNOSIS — M1990 Unspecified osteoarthritis, unspecified site: Secondary | ICD-10-CM

## 2013-05-29 DIAGNOSIS — M899 Disorder of bone, unspecified: Secondary | ICD-10-CM

## 2013-05-29 DIAGNOSIS — E1159 Type 2 diabetes mellitus with other circulatory complications: Secondary | ICD-10-CM

## 2013-05-29 DIAGNOSIS — Z0000 Encounter for general adult medical examination without abnormal findings: Secondary | ICD-10-CM

## 2013-05-29 DIAGNOSIS — E782 Mixed hyperlipidemia: Secondary | ICD-10-CM

## 2013-05-29 MED ORDER — TRAMADOL HCL 50 MG PO TABS: 50.0000 mg | ORAL_TABLET | Freq: Three times a day (TID) | ORAL | Status: DC | PRN

## 2013-05-29 NOTE — Telephone Encounter (Signed)
Future order for hemoglobin A1c entered into Epic. JG//CMA

## 2013-05-29 NOTE — Telephone Encounter (Signed)
05/29/2013  At check out, Pt stated you wanted to have labs rechecked in 6 months.  Scheduled appt, no future lab orders in chart.  Thanks, bw

## 2013-05-29 NOTE — Patient Instructions (Signed)
The most common cause of elevated triglycerides (TG) is the ingestion of sugar from high fructose corn syrup sources added to processed foods & drinks.  Eat a low-fat diet with lots of fruits and vegetables, up to 7-9 servings per day. Consume less than 30 Grams (preferably ZERO) of sugar per day from foods & drinks with High Fructose Corn Syrup (HFCS) sugar as #1,2,3 or # 4 on label.Whole Foods, [employer] & [employer] do not carry products with HFCS.  

## 2013-05-29 NOTE — Progress Notes (Signed)
Subjective:    Patient ID: [name], female    DOB: [birth year], 72 y.o.   MRN: [id number]  HPI Medicare Wellness Visit: Psychosocial and medical history were reviewed as required by Medicare (history related to abuse, antisocial behavior , firearm risk). Social history: Caffeine:1 cup coffee  , Alcohol: 2-4 / week , Tobacco WUJ:WJXBJ Exercise:see below Personal safety/fall risk:no Limitations of activities of daily living:no Seatbelt/ smoke alarm use:yes Healthcare Power of Attorney/Living Will status: in place Ophthalmologic exam status:current , no retinopathy Hearing evaluation status:not current Orientation: Oriented X 3 Memory and recall: good Math testing: good Depression/anxiety assessment: no Foreign travel history:[place] 7/14 Immunization status for influenza/pneumonia/ shingles /tetanus: Shingles needed Transfusion history:no Preventive health care maintenance status: Colonoscopy/BMD/mammogram/Pap as per protocol/standard care:UTD Dental care:every 6 mos Chart reviewed and updated. Active issues reviewed and addressed as documented below.    Review of Systems A heart healthy diet is followed; exercise encompasses 60 minutes 3  times per week as walking & CVE@Y  without symptoms.  Family history is positive for premature CVA. To date no statin; declined.  Low dose ASA taken Specifically denied are  chest pain, palpitations, dyspnea, or claudication.  FBS 95-124; no hypoglycemia. A1c 6.4% 11/14 on Metformin bid ; decreased to once daily.  She is inquiring about tramadol for her degenerative joint disease; this mainly involves the shoulders and the lumbosacral area. She had a left total hip replacement 06/01/12 for end-stage degenerative disease.    Objective:   Physical Exam  Gen.: Healthy and well-nourished in appearance. Alert, appropriate and cooperative throughout exam. Appears younger than stated age  Head: Normocephalic without obvious abnormalities Eyes:  No corneal or conjunctival inflammation noted. Pupils equal round reactive to light and accommodation. Extraocular motion intact. Ears: External  ear exam reveals no significant lesions or deformities. Canals clear .TMs normal. Hearing is grossly normal bilaterally. Nose: External nasal exam reveals no deformity or inflammation. Nasal mucosa are pink and moist. No lesions or exudates noted.  Mouth: Oral mucosa and oropharynx reveal no lesions or exudates. Teeth in good repair. Neck: No deformities, masses, or tenderness noted. Range of motion decreased. Thyroid normal. Lungs: Normal respiratory effort; chest expands symmetrically. Lungs are clear to auscultation without rales, wheezes, or increased work of breathing. Heart: Normal rate and rhythm. Normal S1 and S2. No gallop, click, or rub. Grade 1/2 over 6 systolic murmur Abdomen: Bowel sounds normal; abdomen soft and nontender. No masses, organomegaly or hernias noted. Genitalia:  as per Gyn                                  Musculoskeletal/extremities: No deformity or scoliosis noted of  the thoracic or lumbar spine.  No clubbing, cyanosis, edema, or significant extremity  deformity noted. Range of motion normal .Tone & strength normal. Hand joints normal . Fingernail / toenail health good. Some hammering of  toes . Able to lie down & sit up w/o help. Negative SLR bilaterally Vascular: Carotid, radial artery, dorsalis pedis and  posterior tibial pulses are  equal. Decreased DPP.No bruits present. Neurologic: Alert and oriented x3. Deep tendon reflexes symmetrical and normal.       Skin: Intact without suspicious lesions or rashes. Lymph: No cervical, axillary lymphadenopathy present. Psych: Mood and affect are normal. Normally interactive  Assessment & Plan:  #1 Medicare Wellness Exam; criteria met ; data entered #2 Problem List/Diagnoses reviewed Plan:   Assessments made/ Orders entered

## 2013-05-29 NOTE — Progress Notes (Signed)
Pre visit review using our clinic review tool, if applicable. No additional management support is needed unless otherwise documented below in the visit note. 

## 2013-06-12 ENCOUNTER — Encounter: Payer: Self-pay | Admitting: Internal Medicine

## 2013-06-12 NOTE — Assessment & Plan Note (Addendum)
We discussed continuation of her allergy vaccine. Updated EpiPen. Try ipratropium nasal spray

## 2013-06-12 NOTE — Assessment & Plan Note (Signed)
Discussed pneumonia vaccine. Restart Singulair

## 2013-07-27 ENCOUNTER — Encounter: Payer: Self-pay | Admitting: Internal Medicine

## 2013-07-30 ENCOUNTER — Telehealth: Payer: Self-pay | Admitting: *Deleted

## 2013-07-30 NOTE — Telephone Encounter (Signed)
Message copied by Harl Bowie on Tue Jul 30, 2013  5:36 PM ------      Message from: Hendricks Limes      Created: Sat Jul 27, 2013 12:16 PM       Ophthalmology exam by Dr Katy Fitch 2/23: no retinopathy. FYI; I enter such results under DM in Problem List ------

## 2013-07-30 NOTE — Telephone Encounter (Signed)
Pt's chart updated.//AB/CMA 

## 2013-08-06 ENCOUNTER — Telehealth: Payer: Self-pay | Admitting: Internal Medicine

## 2013-08-06 MED ORDER — DOXYCYCLINE HYCLATE 100 MG PO TABS: ORAL_TABLET | ORAL | Status: DC

## 2013-08-06 NOTE — Telephone Encounter (Signed)
Spoke with patient-she states she has a sinus infection; also had a sore throat. Drainage is brown/yellow in color. Pt has been gargling and her throat feels better. Denies any fever or chills, SOB and wheezing.   No Known Allergies  CY, Please advise. Thanks.

## 2013-08-06 NOTE — Telephone Encounter (Signed)
Per CY-give Doxycycline 100 mg #8 take 2 today then 1 daily no refills. Pt is aware of Rx from CY and I have sent to [facility]

## 2013-08-08 ENCOUNTER — Ambulatory Visit (INDEPENDENT_AMBULATORY_CARE_PROVIDER_SITE_OTHER): Payer: Managed Care, Other (non HMO) | Admitting: Family Medicine

## 2013-08-08 VITALS — BP 116/74 | HR 83 | Temp 98.8°F | Resp 16 | Ht 63.5 in | Wt 142.6 lb

## 2013-08-08 DIAGNOSIS — R05 Cough: Secondary | ICD-10-CM

## 2013-08-08 DIAGNOSIS — R059 Cough, unspecified: Secondary | ICD-10-CM

## 2013-08-08 DIAGNOSIS — J329 Chronic sinusitis, unspecified: Secondary | ICD-10-CM

## 2013-08-08 DIAGNOSIS — J45901 Unspecified asthma with (acute) exacerbation: Secondary | ICD-10-CM

## 2013-08-08 MED ORDER — HYDROCODONE-HOMATROPINE 5-1.5 MG/5ML PO SYRP: 5.0000 mL | ORAL_SOLUTION | Freq: Three times a day (TID) | ORAL | Status: DC | PRN

## 2013-08-08 MED ORDER — PREDNISONE 20 MG PO TABS: 40.0000 mg | ORAL_TABLET | Freq: Every day | ORAL | Status: DC

## 2013-08-08 MED ORDER — METHYLPREDNISOLONE ACETATE 80 MG/ML IJ SUSP
80.0000 mg | Freq: Once | INTRAMUSCULAR | Status: AC
  Administered 2013-08-08: 80 mg via INTRAMUSCULAR

## 2013-08-08 NOTE — Patient Instructions (Signed)
Asthma, Adult Asthma is a recurring condition in which the airways tighten and narrow. Asthma can make it difficult to breathe. It can cause coughing, wheezing, and shortness of breath. Asthma episodes (also called asthma attacks) range from minor to life-threatening. Asthma cannot be cured, but medicines and lifestyle changes can help control it. CAUSES Asthma is believed to be caused by inherited (genetic) and environmental factors, but its exact cause is unknown. Asthma may be triggered by allergens, lung infections, or irritants in the air. Asthma triggers are different for each person. Common triggers include:   Animal dander.  Dust mites.  Cockroaches.  Pollen from trees or grass.  Mold.  Smoke.  Air pollutants such as dust, household cleaners, hair sprays, aerosol sprays, paint fumes, strong chemicals, or strong odors.  Cold air, weather changes, and winds (which increase molds and pollens in the air).  Strong emotional expressions such as crying or laughing hard.  Stress.  Certain medicines (such as aspirin) or types of drugs (such as beta-blockers).  Sulfites in foods and drinks. Foods and drinks that may contain sulfites include dried fruit, potato chips, and sparkling grape juice.  Infections or inflammatory conditions such as the flu, a cold, or an inflammation of the nasal membranes (rhinitis).  Gastroesophageal reflux disease (GERD).  Exercise or strenuous activity. SYMPTOMS Symptoms may occur immediately after asthma is triggered or many hours later. Symptoms include:  Wheezing.  Excessive nighttime or early morning coughing.  Frequent or severe coughing with a common cold.  Chest tightness.  Shortness of breath. DIAGNOSIS  The diagnosis of asthma is made by a review of your medical history and a physical exam. Tests may also be performed. These may include:  Lung function studies. These tests show how much air you breath in and out.  Allergy  tests.  Imaging tests such as X-rays. TREATMENT  Asthma cannot be cured, but it can usually be controlled. Treatment involves identifying and avoiding your asthma triggers. It also involves medicines. There are 2 classes of medicine used for asthma treatment:   Controller medicines. These prevent asthma symptoms from occurring. They are usually taken every day.  Reliever or rescue medicines. These quickly relieve asthma symptoms. They are used as needed and provide short-term relief. Your health care provider will help you create an asthma action plan. An asthma action plan is a written plan for managing and treating your asthma attacks. It includes a list of your asthma triggers and how they may be avoided. It also includes information on when medicines should be taken and when their dosage should be changed. An action plan may also involve the use of a device called a peak flow meter. A peak flow meter measures how well the lungs are working. It helps you monitor your condition. HOME CARE INSTRUCTIONS   Take medicine as directed by your health care provider. Speak with your health care provider if you have questions about how or when to take the medicines.  Use a peak flow meter as directed by your health care provider. Record and keep track of readings.  Understand and use the action plan to help minimize or stop an asthma attack without needing to seek medical care.  Control your home environment in the following ways to help prevent asthma attacks:  Do not smoke. Avoid being exposed to secondhand smoke.  Change your heating and air conditioning filter regularly.  Limit your use of fireplaces and wood stoves.  Get rid of pests (such as roaches and   mice) and their droppings. °· Throw away plants if you see mold on them. °· Clean your floors and dust regularly. Use unscented cleaning products. °· Try to have someone else vacuum for you regularly. Stay out of rooms while they are being  vacuumed and for a short while afterward. If you vacuum, use a dust mask from a hardware store, a double-layered or microfilter vacuum cleaner bag, or a vacuum cleaner with a HEPA filter. °· Replace carpet with wood, tile, or vinyl flooring. Carpet can trap dander and dust. °· Use allergy-proof pillows, mattress covers, and box spring covers. °· Wash bed sheets and blankets every week in hot water and dry them in a dryer. °· Use blankets that are made of polyester or cotton. °· Clean bathrooms and kitchens with bleach. If possible, have someone repaint the walls in these rooms with mold-resistant paint. Keep out of the rooms that are being cleaned and painted. °· Wash hands frequently. °SEEK MEDICAL CARE IF:  °· You have wheezing, shortness of breath, or a cough even if taking medicine to prevent attacks. °· The colored mucus you cough up (sputum) is thicker than usual. °· Your sputum changes from clear or white to yellow, green, gray, or bloody. °· You have any problems that may be related to the medicines you are taking (such as a rash, itching, swelling, or trouble breathing). °· You are using a reliever medicine more than 2 3 times per week. °· Your peak flow is still at 50 79% of you personal best after following your action plan for 1 hour. °SEEK IMMEDIATE MEDICAL CARE IF:  °· You seem to be getting worse and are unresponsive to treatment during an asthma attack. °· You are short of breath even at rest. °· You get short of breath when doing very little physical activity. °· You have difficulty eating, drinking, or talking due to asthma symptoms. °· You develop chest pain. °· You develop a fast heartbeat. °· You have a bluish color to your lips or fingernails. °· You are lightheaded, dizzy, or faint. °· Your peak flow is less than 50% of your personal best. °· You have a fever or persistent symptoms for more than 2 3 days. °· You have a fever and symptoms suddenly get worse. °MAKE SURE YOU:  °· Understand these  instructions. °· Will watch your condition. °· Will get help right away if you are not doing well or get worse. °Document Released: 05/16/2005 Document Revised: 01/16/2013 Document Reviewed: 12/13/2012 °ExitCare® Patient Information ©2014 [employer]. ° °Sinusitis °Sinusitis is redness, soreness, and swelling (inflammation) of the paranasal sinuses. Paranasal sinuses are air pockets within the bones of your face (beneath the eyes, the middle of the forehead, or above the eyes). In healthy paranasal sinuses, mucus is able to drain out, and air is able to circulate through them by way of your nose. However, when your paranasal sinuses are inflamed, mucus and air can become trapped. This can allow bacteria and other germs to grow and cause infection. °Sinusitis can develop quickly and last only a short time (acute) or continue over a long period (chronic). Sinusitis that lasts for more than 12 weeks is considered chronic.  °CAUSES  °Causes of sinusitis include: °· Allergies. °· Structural abnormalities, such as displacement of the cartilage that separates your nostrils (deviated septum), which can decrease the air flow through your nose and sinuses and affect sinus drainage. °· Functional abnormalities, such as when the small hairs (cilia) that line your   sinuses and help remove mucus do not work properly or are not present. °SYMPTOMS  °Symptoms of acute and chronic sinusitis are the same. The primary symptoms are pain and pressure around the affected sinuses. Other symptoms include: °· Upper toothache. °· Earache. °· Headache. °· Bad breath. °· Decreased sense of smell and taste. °· A cough, which worsens when you are lying flat. °· Fatigue. °· Fever. °· Thick drainage from your nose, which often is green and may contain pus (purulent). °· Swelling and warmth over the affected sinuses. °DIAGNOSIS  °Your caregiver will perform a physical exam. During the exam, your caregiver may: °· Look in your nose for signs of  abnormal growths in your nostrils (nasal polyps). °· Tap over the affected sinus to check for signs of infection. °· View the inside of your sinuses (endoscopy) with a special imaging device with a light attached (endoscope), which is inserted into your sinuses. °If your caregiver suspects that you have chronic sinusitis, one or more of the following tests may be recommended: °· Allergy tests. °· Nasal culture A sample of mucus is taken from your nose and sent to a lab and screened for bacteria. °· Nasal cytology A sample of mucus is taken from your nose and examined by your caregiver to determine if your sinusitis is related to an allergy. °TREATMENT  °Most cases of acute sinusitis are related to a viral infection and will resolve on their own within 10 days. Sometimes medicines are prescribed to help relieve symptoms (pain medicine, decongestants, nasal steroid sprays, or saline sprays).  °However, for sinusitis related to a bacterial infection, your caregiver will prescribe antibiotic medicines. These are medicines that will help kill the bacteria causing the infection.  °Rarely, sinusitis is caused by a fungal infection. In theses cases, your caregiver will prescribe antifungal medicine. °For some cases of chronic sinusitis, surgery is needed. Generally, these are cases in which sinusitis recurs more than 3 times per year, despite other treatments. °HOME CARE INSTRUCTIONS  °· Drink plenty of water. Water helps thin the mucus so your sinuses can drain more easily. °· Use a humidifier. °· Inhale steam 3 to 4 times a day (for example, sit in the bathroom with the shower running). °· Apply a warm, moist washcloth to your face 3 to 4 times a day, or as directed by your caregiver. °· Use saline nasal sprays to help moisten and clean your sinuses. °· Take over-the-counter or prescription medicines for pain, discomfort, or fever only as directed by your caregiver. °SEEK IMMEDIATE MEDICAL CARE IF: °· You have increasing  pain or severe headaches. °· You have nausea, vomiting, or drowsiness. °· You have swelling around your face. °· You have vision problems. °· You have a stiff neck. °· You have difficulty breathing. °MAKE SURE YOU:  °· Understand these instructions. °· Will watch your condition. °· Will get help right away if you are not doing well or get worse. °Document Released: 05/16/2005 Document Revised: 08/08/2011 Document Reviewed: 05/31/2011 °ExitCare® Patient Information ©2014 [employer]. ° °

## 2013-08-08 NOTE — Progress Notes (Signed)
73 yo woman with asthma who developed sinus symptoms 5 days ago, started doxycycline 2 days ago, and developed cough and chest tightness. Patient has h/o asthma on advair-well-controlled.  Had to use albuterol twice today which is unusual. Teeth are aching, nose is stuffy, and roof of mouth is tender Retired Writer  Objective:  NAD HEENT:  Swollen red nasal passages, negative TM's and throat Neck:  Supple, no adenopathy Chest: few faint wheezes.  Assessment: asthma flare in response to sinusitis  Plan:   Asthma with acute exacerbation - Plan: predniSONE (DELTASONE) 20 MG tablet, HYDROcodone-homatropine (HYCODAN) 5-1.5 MG/5ML syrup, methylPREDNISolone acetate (DEPO-MEDROL) injection 80 mg  Cough - Plan: HYDROcodone-homatropine (HYCODAN) 5-1.5 MG/5ML syrup, methylPREDNISolone acetate (DEPO-MEDROL) injection 80 mg  Sinusitis - Plan: HYDROcodone-homatropine (HYCODAN) 5-1.5 MG/5ML syrup, methylPREDNISolone acetate (DEPO-MEDROL) injection 80 mg  Signed, Robyn Haber, MD

## 2013-08-15 ENCOUNTER — Ambulatory Visit (INDEPENDENT_AMBULATORY_CARE_PROVIDER_SITE_OTHER): Payer: Medicare HMO | Admitting: Internal Medicine

## 2013-08-15 ENCOUNTER — Encounter: Payer: Self-pay | Admitting: Internal Medicine

## 2013-08-15 VITALS — BP 122/78 | HR 93 | Ht 63.0 in | Wt 143.0 lb

## 2013-08-15 DIAGNOSIS — J45998 Other asthma: Secondary | ICD-10-CM

## 2013-08-15 DIAGNOSIS — J45909 Unspecified asthma, uncomplicated: Secondary | ICD-10-CM

## 2013-08-15 DIAGNOSIS — J302 Other seasonal allergic rhinitis: Secondary | ICD-10-CM

## 2013-08-15 DIAGNOSIS — J3089 Other allergic rhinitis: Principal | ICD-10-CM

## 2013-08-15 DIAGNOSIS — J309 Allergic rhinitis, unspecified: Secondary | ICD-10-CM

## 2013-08-15 MED ORDER — METHYLPREDNISOLONE ACETATE 80 MG/ML IJ SUSP
80.0000 mg | Freq: Once | INTRAMUSCULAR | Status: AC
  Administered 2013-08-15: 80 mg via INTRAMUSCULAR

## 2013-08-15 MED ORDER — AMOXICILLIN-POT CLAVULANATE 875-125 MG PO TABS: 1.0000 | ORAL_TABLET | Freq: Two times a day (BID) | ORAL | Status: DC

## 2013-08-15 MED ORDER — PHENYLEPHRINE HCL 1 % NA SOLN
3.0000 [drp] | Freq: Once | NASAL | Status: AC
  Administered 2013-08-15: 3 [drp] via NASAL

## 2013-08-15 NOTE — Patient Instructions (Signed)
Neb nasal neo  Depo 80  Script sent for augmentin antibiotic  Ok to use Neti pot  Ok to try staying off Singulair if you can't tell that it helps.

## 2013-08-15 NOTE — Progress Notes (Signed)
05/09/11- 52 yoF never smoker  followed for allergic rhinitis, asthma, complicated by history embolic CVA complicating C-spine surgery.LOV-06/15/10 Has had flu vaccine. Says she has done well since last visit. Now working part-time. She continues allergy vaccine and feels that definitely helps.  05/15/12- 71 yoF never smoker  followed for allergic rhinitis, asthma, complicated by history embolic CVA complicating C-spine surgery FOLLOWS FOR: allergy attack last Thursday; dry cough today but no wheezing or SOB at this time Doing well with allergy vaccine 1:10 GO. We discussed risks and benefits, goals and duration of therapy. She is satisfied to continue. Blames "allergy attack" for increased sneezing and watery rhinorrhea, undefined trigger. She continues OTC antihistamine at bedtime. Out of Nasonex. Occasional wheeze. In the fall season uses rescue inhaler 2 or 3 times per week.  05/16/13- 51 yoF never smoker  followed for allergic rhinitis, asthma, complicated by history embolic CVA complicating C-spine surgery Follows for- annual rov.  Pt c/o itchy, watery eyes since the weather turned cold.  Pt has no complaints with her Allergy vaccine 1:10 GO. Feels pressure in left ear without loss of hearing. Transient wheeze resolved with her rescue inhaler. Stays on Advair.  08/15/13- 18 yoF never smoker  followed for allergic rhinitis, asthma, complicated by history embolic CVA complicating C-spine surgery ACUTE VISIT: still on Allergy vaccine 1:10 GO; ? sinus infection-still having sinus pressure, congestion, wheezing, blowing nose-clear to yellow in color; cough as well-lite yellow in color. Started doxycycline. Began wheezing and urgent care gave prednisone ending 3 days ago. Now less cough with scant white sputum but still nasal congestion. Today started intermittent left parasternal pain, currently gone, not exertional. Ipratropium nasal spray too drying. Using Neti pot and over-the-counter  decongestant.  ROV-see HPI Constitutional:   No-   weight loss, night sweats, fevers, chills, fatigue, lassitude. HEENT:   No-  headaches, difficulty swallowing, tooth/dental problems, sore throat,       + Variable sneezing, itching, +ear ache, +nasal congestion, post nasal drip,  CV:  +chest pain, no-orthopnea, PND, swelling in lower extremities, anasarca, dizziness, palpitations Resp: No-   shortness of breath with exertion or at rest.              No-   productive cough,  No non-productive cough,  No- coughing up of blood.              No-   change in color of mucus.  + wheezing.   Skin: No-   rash or lesions. GI:  GU:  MS:  No-   joint pain or swelling.  No- decreased range of motion.   Neuro-     nothing unusual Psych:  No- change in mood or affect. No depression or anxiety.  No memory loss.  OBJ General- Alert, Oriented, Affect-appropriate, Distress- none acute Skin- rash-none, lesions- none, excoriation- none Lymphadenopathy- none Head- atraumatic            Eyes- Gross vision intact, PERRLA, conjunctivae clear secretions            Ears- Hearing, canals-normal            Nose- Clear, no-Septal dev, mucus, polyps, erosion, perforation             Throat- Mallampati II , mucosa clear , drainage- none, tonsils- atrophic, +hoarse Neck- flexible , trachea midline, no stridor , thyroid nl, carotid no bruit Chest - symmetrical excursion , unlabored           Heart/CV- RRR ,  no murmur , no gallop  , no rub, nl s1 s2                           - JVD- none , edema- none, stasis changes- none, varices- none           Lung- clear to P&A, wheeze- none, cough+harsh , dullness-none, rub- none           Chest wall-  Abd-  Br/ Gen/ Rectal- Not done, not indicated Extrem- cyanosis- none, clubbing, none, atrophy- none, strength- nl Neuro- grossly intact to observation

## 2013-09-01 NOTE — Assessment & Plan Note (Signed)
Recent sinusitis Plan-Augmentin, continue Neti pot and decongestant

## 2013-09-01 NOTE — Assessment & Plan Note (Signed)
Exacerbation started as a sinus infection. Has now finished a prednisone taper. We think as sinusitis resolves, her asthma may fade with her routine meds

## 2013-09-10 ENCOUNTER — Ambulatory Visit (INDEPENDENT_AMBULATORY_CARE_PROVIDER_SITE_OTHER): Payer: Medicare HMO

## 2013-09-10 DIAGNOSIS — J309 Allergic rhinitis, unspecified: Secondary | ICD-10-CM

## 2013-10-12 ENCOUNTER — Ambulatory Visit (INDEPENDENT_AMBULATORY_CARE_PROVIDER_SITE_OTHER): Payer: Medicare HMO

## 2013-10-31 ENCOUNTER — Telehealth: Payer: Self-pay | Admitting: Family Medicine

## 2013-10-31 NOTE — Telephone Encounter (Signed)
Ok to send rx

## 2013-10-31 NOTE — Telephone Encounter (Signed)
Caller name: Bryer Relation to pt: self Call back number :[phone]   Reason for call:  Pt wants to get a script for the shingles vaccine.  Pt has a new pt appoint in Jan 2016.  Can this be addressed by our office since it's not a current medication, or should she be directed to Dr. Linna Darner?

## 2013-10-31 NOTE — Telephone Encounter (Signed)
MSG left to call the office      KP 

## 2013-10-31 NOTE — Telephone Encounter (Signed)
Please advise if it ok to send script for shingles vaccine.      KP

## 2013-11-01 MED ORDER — ZOSTER VACCINE LIVE 19400 UNT/0.65ML ~~LOC~~ SOLR: 0.6500 mL | Freq: Once | SUBCUTANEOUS | Status: DC

## 2013-11-01 NOTE — Telephone Encounter (Signed)
Spoke with patient and she is aware that he shingles vaccine has been faxed, she made me aware that she actually has shingles. I made her aware not to get the vaccine at this time, she said she would wait until she has confirmed that they are all gone. Patient was treated at [facility].       KP

## 2013-11-26 ENCOUNTER — Other Ambulatory Visit: Payer: Medicare Other

## 2013-12-27 ENCOUNTER — Other Ambulatory Visit: Payer: Self-pay | Admitting: Internal Medicine

## 2014-02-25 ENCOUNTER — Other Ambulatory Visit: Payer: Self-pay | Admitting: Orthopedic Surgery

## 2014-02-25 DIAGNOSIS — M25511 Pain in right shoulder: Secondary | ICD-10-CM

## 2014-02-28 ENCOUNTER — Ambulatory Visit (INDEPENDENT_AMBULATORY_CARE_PROVIDER_SITE_OTHER): Payer: Medicare HMO

## 2014-02-28 DIAGNOSIS — J309 Allergic rhinitis, unspecified: Secondary | ICD-10-CM

## 2014-03-05 ENCOUNTER — Other Ambulatory Visit: Payer: Medicare HMO

## 2014-03-10 ENCOUNTER — Other Ambulatory Visit: Payer: Medicare HMO

## 2014-03-13 ENCOUNTER — Ambulatory Visit (INDEPENDENT_AMBULATORY_CARE_PROVIDER_SITE_OTHER): Payer: Medicare HMO

## 2014-03-13 DIAGNOSIS — Z23 Encounter for immunization: Secondary | ICD-10-CM

## 2014-04-04 ENCOUNTER — Encounter: Payer: Self-pay | Admitting: Internal Medicine

## 2014-05-15 ENCOUNTER — Ambulatory Visit (INDEPENDENT_AMBULATORY_CARE_PROVIDER_SITE_OTHER): Payer: Medicare HMO | Admitting: Internal Medicine

## 2014-05-15 ENCOUNTER — Encounter (INDEPENDENT_AMBULATORY_CARE_PROVIDER_SITE_OTHER): Payer: Self-pay

## 2014-05-15 ENCOUNTER — Encounter: Payer: Self-pay | Admitting: Internal Medicine

## 2014-05-15 VITALS — BP 122/62 | HR 60 | Ht 63.0 in | Wt 149.8 lb

## 2014-05-15 DIAGNOSIS — J45998 Other asthma: Secondary | ICD-10-CM

## 2014-05-15 DIAGNOSIS — J302 Other seasonal allergic rhinitis: Secondary | ICD-10-CM

## 2014-05-15 DIAGNOSIS — J309 Allergic rhinitis, unspecified: Secondary | ICD-10-CM

## 2014-05-15 DIAGNOSIS — J3089 Other allergic rhinitis: Principal | ICD-10-CM

## 2014-05-15 MED ORDER — ALBUTEROL SULFATE HFA 108 (90 BASE) MCG/ACT IN AERS: 2.0000 | INHALATION_SPRAY | Freq: Four times a day (QID) | RESPIRATORY_TRACT | Status: DC | PRN

## 2014-05-15 MED ORDER — FLUTICASONE-SALMETEROL 250-50 MCG/DOSE IN AEPB: 1.0000 | INHALATION_SPRAY | Freq: Two times a day (BID) | RESPIRATORY_TRACT | Status: DC

## 2014-05-15 MED ORDER — EPINEPHRINE 0.3 MG/0.3ML IJ SOAJ: INTRAMUSCULAR | Status: DC

## 2014-05-15 NOTE — Progress Notes (Signed)
05/09/11- 80 yoF never smoker  followed for allergic rhinitis, asthma, complicated by history embolic CVA complicating C-spine surgery.LOV-06/15/10 Has had flu vaccine. Says she has done well since last visit. Now working part-time. She continues allergy vaccine and feels that definitely helps.  05/15/12- 71 yoF never smoker  followed for allergic rhinitis, asthma, complicated by history embolic CVA complicating C-spine surgery FOLLOWS FOR: allergy attack last Thursday; dry cough today but no wheezing or SOB at this time Doing well with allergy vaccine 1:10 GO. We discussed risks and benefits, goals and duration of therapy. She is satisfied to continue. Blames "allergy attack" for increased sneezing and watery rhinorrhea, undefined trigger. She continues OTC antihistamine at bedtime. Out of Nasonex. Occasional wheeze. In the fall season uses rescue inhaler 2 or 3 times per week.  05/16/13- 27 yoF never smoker  followed for allergic rhinitis, asthma, complicated by history embolic CVA complicating C-spine surgery Follows for- annual rov.  Pt c/o itchy, watery eyes since the weather turned cold.  Pt has no complaints with her Allergy vaccine 1:10 GO. Feels pressure in left ear without loss of hearing. Transient wheeze resolved with her rescue inhaler. Stays on Advair.  08/15/13- 16 yoF never smoker  followed for allergic rhinitis, asthma, complicated by history embolic CVA complicating C-spine surgery ACUTE VISIT: still on Allergy vaccine 1:10 GO; ? sinus infection-still having sinus pressure, congestion, wheezing, blowing nose-clear to yellow in color; cough as well-lite yellow in color. Started doxycycline. Began wheezing and urgent care gave prednisone ending 3 days ago. Now less cough with scant white sputum but still nasal congestion. Today started intermittent left parasternal pain, currently gone, not exertional. Ipratropium nasal spray too drying. Using Neti pot and over-the-counter  decongestant.  05/15/14- 73 yoF never smoker  followed for allergic rhinitis, asthma, complicated by history embolic CVA complicating C-spine surgery FOLLOWS FOR: Still on vaccine 1:10 GO- 1 shot every 2 weeks and doing well. Needs Refills 90 day supply printed for Advair and Albuterol HFA. Discussed medications. Asthma control has been very good.  ROV-see HPI Constitutional:   No-   weight loss, night sweats, fevers, chills, fatigue, lassitude. HEENT:   No-  headaches, difficulty swallowing, tooth/dental problems, sore throat,       + Variable sneezing, itching, +ear ache, +nasal congestion, post nasal drip,  CV:  +chest pain, no-orthopnea, PND, swelling in lower extremities, anasarca, dizziness, palpitations Resp: No-   shortness of breath with exertion or at rest.              No-   productive cough,  No non-productive cough,  No- coughing up of blood.              No-   change in color of mucus.  No- wheezing.   Skin: No-   rash or lesions. GI:  GU:  MS:  No-   joint pain or swelling.  No- decreased range of motion.   Neuro-     nothing unusual Psych:  No- change in mood or affect. No depression or anxiety.  No memory loss.  OBJ General- Alert, Oriented, Affect-appropriate, Distress- none acute Skin- rash-none, lesions- none, excoriation- none Lymphadenopathy- none Head- atraumatic            Eyes- Gross vision intact, PERRLA, conjunctivae clear secretions            Ears- Hearing, canals-normal            Nose- Clear, no-Septal dev, mucus, polyps, erosion, perforation  Throat- Mallampati II , mucosa clear , drainage- none, tonsils- atrophic,  Neck- flexible , trachea midline, no stridor , thyroid nl, carotid no bruit Chest - symmetrical excursion , unlabored           Heart/CV- RRR , no murmur , no gallop  , no rub, nl s1 s2                           - JVD- none , edema- none, stasis changes- none, varices- none           Lung- clear to P&A, wheeze- none, cough+harsh  , dullness-none, rub- none           Chest wall-  Abd-  Br/ Gen/ Rectal- Not done, not indicated Extrem- cyanosis- none, clubbing, none, atrophy- none, strength- nl Neuro- grossly intact to observation

## 2014-05-15 NOTE — Patient Instructions (Signed)
We can continue allergy vaccine every other week  Refills printed for your meds  Please call as needed

## 2014-05-17 NOTE — Assessment & Plan Note (Signed)
Mild intermittent, well-controlled °

## 2014-05-17 NOTE — Assessment & Plan Note (Signed)
She feels satisfied to continue allergy vaccine 1:10 GO with injection every other week. Goals and scope of treatment reviewed.

## 2014-06-02 ENCOUNTER — Ambulatory Visit (INDEPENDENT_AMBULATORY_CARE_PROVIDER_SITE_OTHER): Payer: Medicare Other | Admitting: Family Medicine

## 2014-06-02 ENCOUNTER — Encounter: Payer: Self-pay | Admitting: Family Medicine

## 2014-06-02 VITALS — BP 120/72 | HR 78 | Temp 97.6°F | Resp 16 | Ht 64.0 in | Wt 142.4 lb

## 2014-06-02 DIAGNOSIS — E119 Type 2 diabetes mellitus without complications: Secondary | ICD-10-CM

## 2014-06-02 DIAGNOSIS — J4520 Mild intermittent asthma, uncomplicated: Secondary | ICD-10-CM

## 2014-06-02 DIAGNOSIS — Z0000 Encounter for general adult medical examination without abnormal findings: Secondary | ICD-10-CM

## 2014-06-02 DIAGNOSIS — R8290 Unspecified abnormal findings in urine: Secondary | ICD-10-CM

## 2014-06-02 DIAGNOSIS — E785 Hyperlipidemia, unspecified: Secondary | ICD-10-CM | POA: Diagnosis not present

## 2014-06-02 LAB — LIPID PANEL
CHOLESTEROL: 223 mg/dL — AB (ref 0–200)
HDL: 41.2 mg/dL (ref >39.00)
NONHDL: 181.8
TRIGLYCERIDES: 287 mg/dL — AB (ref 0.0–149.0)
Total CHOL/HDL Ratio: 5
VLDL: 57.4 mg/dL — ABNORMAL HIGH (ref 0.0–40.0)

## 2014-06-02 LAB — CBC WITH DIFFERENTIAL/PLATELET
BASOS ABS: 0 10*3/uL (ref 0.0–0.1)
Basophils Relative: 0.3 % (ref 0.0–3.0)
EOS PCT: 1.4 % (ref 0.0–5.0)
Eosinophils Absolute: 0.1 10*3/uL (ref 0.0–0.7)
HEMATOCRIT: 36.9 % (ref 36.0–46.0)
Hemoglobin: 11.8 g/dL — ABNORMAL LOW (ref 12.0–15.0)
LYMPHS ABS: 2 10*3/uL (ref 0.7–4.0)
Lymphocytes Relative: 27.6 % (ref 12.0–46.0)
MCHC: 32 g/dL (ref 30.0–36.0)
MCV: 85.7 fl (ref 78.0–100.0)
MONO ABS: 0.5 10*3/uL (ref 0.1–1.0)
MONOS PCT: 7.4 % (ref 3.0–12.0)
NEUTROS PCT: 63.3 % (ref 43.0–77.0)
Neutro Abs: 4.7 10*3/uL (ref 1.4–7.7)
Platelets: 269 10*3/uL (ref 150.0–400.0)
RBC: 4.3 Mil/uL (ref 3.87–5.11)
RDW: 13.8 % (ref 11.5–15.5)
WBC: 7.4 10*3/uL (ref 4.0–10.5)

## 2014-06-02 LAB — POCT URINALYSIS DIPSTICK
Bilirubin, UA: NEGATIVE
Blood, UA: NEGATIVE
GLUCOSE UA: NEGATIVE
Ketones, UA: NEGATIVE
NITRITE UA: NEGATIVE
Protein, UA: NEGATIVE
UROBILINOGEN UA: 0.2
pH, UA: 6

## 2014-06-02 LAB — BASIC METABOLIC PANEL
BUN: 15 mg/dL (ref 6–23)
CALCIUM: 9.1 mg/dL (ref 8.4–10.5)
CO2: 27 mEq/L (ref 19–32)
Chloride: 106 mEq/L (ref 96–112)
Creatinine, Ser: 0.9 mg/dL (ref 0.4–1.2)
GFR: 66.02 mL/min (ref >60.00)
Glucose, Bld: 119 mg/dL — ABNORMAL HIGH (ref 70–99)
Potassium: 3.8 mEq/L (ref 3.5–5.1)
Sodium: 139 mEq/L (ref 135–145)

## 2014-06-02 LAB — HEPATIC FUNCTION PANEL
ALT: 17 U/L (ref 0–35)
AST: 13 U/L (ref 0–37)
Albumin: 3.5 g/dL (ref 3.5–5.2)
Alkaline Phosphatase: 63 U/L (ref 39–117)
BILIRUBIN DIRECT: 0 mg/dL (ref 0.0–0.3)
TOTAL PROTEIN: 6.7 g/dL (ref 6.0–8.3)
Total Bilirubin: 0.4 mg/dL (ref 0.2–1.2)

## 2014-06-02 LAB — MICROALBUMIN / CREATININE URINE RATIO
CREATININE, U: 142.5 mg/dL
MICROALB UR: 0.4 mg/dL (ref 0.0–1.9)
MICROALB/CREAT RATIO: 0.3 mg/g (ref 0.0–30.0)

## 2014-06-02 LAB — LDL CHOLESTEROL, DIRECT: Direct LDL: 125.7 mg/dL

## 2014-06-02 LAB — HEMOGLOBIN A1C: HEMOGLOBIN A1C: 6.8 % — AB (ref 4.6–6.5)

## 2014-06-02 MED ORDER — METFORMIN HCL ER 500 MG PO TB24: ORAL_TABLET | ORAL | Status: DC

## 2014-06-02 NOTE — Patient Instructions (Signed)
Preventive Care for Adults A healthy lifestyle and preventive care can promote health and wellness. Preventive health guidelines for women include the following key practices.  A routine yearly physical is a good way to check with your health care provider about your health and preventive screening. It is a chance to share any concerns and updates on your health and to receive a thorough exam.  Visit your dentist for a routine exam and preventive care every 6 months. Brush your teeth twice a day and floss once a day. Good oral hygiene prevents tooth decay and gum disease.  The frequency of eye exams is based on your age, health, family medical history, use of contact lenses, and other factors. Follow your health care provider's recommendations for frequency of eye exams.  Eat a healthy diet. Foods like vegetables, fruits, whole grains, low-fat dairy products, and lean protein foods contain the nutrients you need without too many calories. Decrease your intake of foods high in solid fats, added sugars, and salt. Eat the right amount of calories for you.Get information about a proper diet from your health care provider, if necessary.  Regular physical exercise is one of the most important things you can do for your health. Most adults should get at least 150 minutes of moderate-intensity exercise (any activity that increases your heart rate and causes you to sweat) each week. In addition, most adults need muscle-strengthening exercises on 2 or more days a week.  Maintain a healthy weight. The body mass index (BMI) is a screening tool to identify possible weight problems. It provides an estimate of body fat based on height and weight. Your health care provider can find your BMI and can help you achieve or maintain a healthy weight.For adults 20 years and older:  A BMI below 18.5 is considered underweight.  A BMI of 18.5 to 24.9 is normal.  A BMI of 25 to 29.9 is considered overweight.  A BMI of  30 and above is considered obese.  Maintain normal blood lipids and cholesterol levels by exercising and minimizing your intake of saturated fat. Eat a balanced diet with plenty of fruit and vegetables. Blood tests for lipids and cholesterol should begin at age 76 and be repeated every 5 years. If your lipid or cholesterol levels are high, you are over 50, or you are at high risk for heart disease, you may need your cholesterol levels checked more frequently.Ongoing high lipid and cholesterol levels should be treated with medicines if diet and exercise are not working.  If you smoke, find out from your health care provider how to quit. If you do not use tobacco, do not start.  Lung cancer screening is recommended for adults aged 22-80 years who are at high risk for developing lung cancer because of a history of smoking. A yearly low-dose CT scan of the lungs is recommended for people who have at least a 30-pack-year history of smoking and are a current smoker or have quit within the past 15 years. A pack year of smoking is smoking an average of 1 pack of cigarettes a day for 1 year (for example: 1 pack a day for 30 years or 2 packs a day for 15 years). Yearly screening should continue until the smoker has stopped smoking for at least 15 years. Yearly screening should be stopped for people who develop a health problem that would prevent them from having lung cancer treatment.  If you are pregnant, do not drink alcohol. If you are breastfeeding,  be very cautious about drinking alcohol. If you are not pregnant and choose to drink alcohol, do not have more than 1 drink per day. One drink is considered to be 12 ounces (355 mL) of beer, 5 ounces (148 mL) of wine, or 1.5 ounces (44 mL) of liquor.  Avoid use of street drugs. Do not share needles with anyone. Ask for help if you need support or instructions about stopping the use of drugs.  High blood pressure causes heart disease and increases the risk of  stroke. Your blood pressure should be checked at least every 1 to 2 years. Ongoing high blood pressure should be treated with medicines if weight loss and exercise do not work.  If you are 75-52 years old, ask your health care provider if you should take aspirin to prevent strokes.  Diabetes screening involves taking a blood sample to check your fasting blood sugar level. This should be done once every 3 years, after age 15, if you are within normal weight and without risk factors for diabetes. Testing should be considered at a younger age or be carried out more frequently if you are overweight and have at least 1 risk factor for diabetes.  Breast cancer screening is essential preventive care for women. You should practice "breast self-awareness." This means understanding the normal appearance and feel of your breasts and may include breast self-examination. Any changes detected, no matter how small, should be reported to a health care provider. Women in their 58s and 30s should have a clinical breast exam (CBE) by a health care provider as part of a regular health exam every 1 to 3 years. After age 16, women should have a CBE every year. Starting at age 53, women should consider having a mammogram (breast X-ray test) every year. Women who have a family history of breast cancer should talk to their health care provider about genetic screening. Women at a high risk of breast cancer should talk to their health care providers about having an MRI and a mammogram every year.  Breast cancer gene (BRCA)-related cancer risk assessment is recommended for women who have family members with BRCA-related cancers. BRCA-related cancers include breast, ovarian, tubal, and peritoneal cancers. Having family members with these cancers may be associated with an increased risk for harmful changes (mutations) in the breast cancer genes BRCA1 and BRCA2. Results of the assessment will determine the need for genetic counseling and  BRCA1 and BRCA2 testing.  Routine pelvic exams to screen for cancer are no longer recommended for nonpregnant women who are considered low risk for cancer of the pelvic organs (ovaries, uterus, and vagina) and who do not have symptoms. Ask your health care provider if a screening pelvic exam is right for you.  If you have had past treatment for cervical cancer or a condition that could lead to cancer, you need Pap tests and screening for cancer for at least 20 years after your treatment. If Pap tests have been discontinued, your risk factors (such as having a new sexual partner) need to be reassessed to determine if screening should be resumed. Some women have medical problems that increase the chance of getting cervical cancer. In these cases, your health care provider may recommend more frequent screening and Pap tests.  The HPV test is an additional test that may be used for cervical cancer screening. The HPV test looks for the virus that can cause the cell changes on the cervix. The cells collected during the Pap test can be  tested for HPV. The HPV test could be used to screen women aged 30 years and older, and should be used in women of any age who have unclear Pap test results. After the age of 30, women should have HPV testing at the same frequency as a Pap test.  Colorectal cancer can be detected and often prevented. Most routine colorectal cancer screening begins at the age of 50 years and continues through age 75 years. However, your health care provider may recommend screening at an earlier age if you have risk factors for colon cancer. On a yearly basis, your health care provider may provide home test kits to check for hidden blood in the stool. Use of a small camera at the end of a tube, to directly examine the colon (sigmoidoscopy or colonoscopy), can detect the earliest forms of colorectal cancer. Talk to your health care provider about this at age 50, when routine screening begins. Direct  exam of the colon should be repeated every 5-10 years through age 75 years, unless early forms of pre-cancerous polyps or small growths are found.  People who are at an increased risk for hepatitis B should be screened for this virus. You are considered at high risk for hepatitis B if:  You were born in a country where hepatitis B occurs often. Talk with your health care provider about which countries are considered high risk.  Your parents were born in a high-risk country and you have not received a shot to protect against hepatitis B (hepatitis B vaccine).  You have HIV or AIDS.  You use needles to inject street drugs.  You live with, or have sex with, someone who has hepatitis B.  You get hemodialysis treatment.  You take certain medicines for conditions like cancer, organ transplantation, and autoimmune conditions.  Hepatitis C blood testing is recommended for all people born from 1945 through 1965 and any individual with known risks for hepatitis C.  Practice safe sex. Use condoms and avoid high-risk sexual practices to reduce the spread of sexually transmitted infections (STIs). STIs include gonorrhea, chlamydia, syphilis, trichomonas, herpes, HPV, and human immunodeficiency virus (HIV). Herpes, HIV, and HPV are viral illnesses that have no cure. They can result in disability, cancer, and death.  You should be screened for sexually transmitted illnesses (STIs) including gonorrhea and chlamydia if:  You are sexually active and are younger than 24 years.  You are older than 24 years and your health care provider tells you that you are at risk for this type of infection.  Your sexual activity has changed since you were last screened and you are at an increased risk for chlamydia or gonorrhea. Ask your health care provider if you are at risk.  If you are at risk of being infected with HIV, it is recommended that you take a prescription medicine daily to prevent HIV infection. This is  called preexposure prophylaxis (PrEP). You are considered at risk if:  You are a heterosexual woman, are sexually active, and are at increased risk for HIV infection.  You take drugs by injection.  You are sexually active with a partner who has HIV.  Talk with your health care provider about whether you are at high risk of being infected with HIV. If you choose to begin PrEP, you should first be tested for HIV. You should then be tested every 3 months for as long as you are taking PrEP.  Osteoporosis is a disease in which the bones lose minerals and strength   with aging. This can result in serious bone fractures or breaks. The risk of osteoporosis can be identified using a bone density scan. Women ages 65 years and over and women at risk for fractures or osteoporosis should discuss screening with their health care providers. Ask your health care provider whether you should take a calcium supplement or vitamin D to reduce the rate of osteoporosis.  Menopause can be associated with physical symptoms and risks. Hormone replacement therapy is available to decrease symptoms and risks. You should talk to your health care provider about whether hormone replacement therapy is right for you.  Use sunscreen. Apply sunscreen liberally and repeatedly throughout the day. You should seek shade when your shadow is shorter than you. Protect yourself by wearing long sleeves, pants, a wide-brimmed hat, and sunglasses year round, whenever you are outdoors.  Once a month, do a whole body skin exam, using a mirror to look at the skin on your back. Tell your health care provider of new moles, moles that have irregular borders, moles that are larger than a pencil eraser, or moles that have changed in shape or color.  Stay current with required vaccines (immunizations).  Influenza vaccine. All adults should be immunized every year.  Tetanus, diphtheria, and acellular pertussis (Td, Tdap) vaccine. Pregnant women should  receive 1 dose of Tdap vaccine during each pregnancy. The dose should be obtained regardless of the length of time since the last dose. Immunization is preferred during the 27th-36th week of gestation. An adult who has not previously received Tdap or who does not know her vaccine status should receive 1 dose of Tdap. This initial dose should be followed by tetanus and diphtheria toxoids (Td) booster doses every 10 years. Adults with an unknown or incomplete history of completing a 3-dose immunization series with Td-containing vaccines should begin or complete a primary immunization series including a Tdap dose. Adults should receive a Td booster every 10 years.  Varicella vaccine. An adult without evidence of immunity to varicella should receive 2 doses or a second dose if she has previously received 1 dose. Pregnant females who do not have evidence of immunity should receive the first dose after pregnancy. This first dose should be obtained before leaving the health care facility. The second dose should be obtained 4-8 weeks after the first dose.  Human papillomavirus (HPV) vaccine. Females aged 13-26 years who have not received the vaccine previously should obtain the 3-dose series. The vaccine is not recommended for use in pregnant females. However, pregnancy testing is not needed before receiving a dose. If a female is found to be pregnant after receiving a dose, no treatment is needed. In that case, the remaining doses should be delayed until after the pregnancy. Immunization is recommended for any person with an immunocompromised condition through the age of 26 years if she did not get any or all doses earlier. During the 3-dose series, the second dose should be obtained 4-8 weeks after the first dose. The third dose should be obtained 24 weeks after the first dose and 16 weeks after the second dose.  Zoster vaccine. One dose is recommended for adults aged 60 years or older unless certain conditions are  present.  Measles, mumps, and rubella (MMR) vaccine. Adults born before 1957 generally are considered immune to measles and mumps. Adults born in 1957 or later should have 1 or more doses of MMR vaccine unless there is a contraindication to the vaccine or there is laboratory evidence of immunity to   each of the three diseases. A routine second dose of MMR vaccine should be obtained at least 28 days after the first dose for students attending postsecondary schools, health care workers, or international travelers. People who received inactivated measles vaccine or an unknown type of measles vaccine during 1963-1967 should receive 2 doses of MMR vaccine. People who received inactivated mumps vaccine or an unknown type of mumps vaccine before 1979 and are at high risk for mumps infection should consider immunization with 2 doses of MMR vaccine. For females of childbearing age, rubella immunity should be determined. If there is no evidence of immunity, females who are not pregnant should be vaccinated. If there is no evidence of immunity, females who are pregnant should delay immunization until after pregnancy. Unvaccinated health care workers born before 1957 who lack laboratory evidence of measles, mumps, or rubella immunity or laboratory confirmation of disease should consider measles and mumps immunization with 2 doses of MMR vaccine or rubella immunization with 1 dose of MMR vaccine.  Pneumococcal 13-valent conjugate (PCV13) vaccine. When indicated, a person who is uncertain of her immunization history and has no record of immunization should receive the PCV13 vaccine. An adult aged 19 years or older who has certain medical conditions and has not been previously immunized should receive 1 dose of PCV13 vaccine. This PCV13 should be followed with a dose of pneumococcal polysaccharide (PPSV23) vaccine. The PPSV23 vaccine dose should be obtained at least 8 weeks after the dose of PCV13 vaccine. An adult aged 19  years or older who has certain medical conditions and previously received 1 or more doses of PPSV23 vaccine should receive 1 dose of PCV13. The PCV13 vaccine dose should be obtained 1 or more years after the last PPSV23 vaccine dose.  Pneumococcal polysaccharide (PPSV23) vaccine. When PCV13 is also indicated, PCV13 should be obtained first. All adults aged 65 years and older should be immunized. An adult younger than age 65 years who has certain medical conditions should be immunized. Any person who resides in a nursing home or long-term care facility should be immunized. An adult smoker should be immunized. People with an immunocompromised condition and certain other conditions should receive both PCV13 and PPSV23 vaccines. People with human immunodeficiency virus (HIV) infection should be immunized as soon as possible after diagnosis. Immunization during chemotherapy or radiation therapy should be avoided. Routine use of PPSV23 vaccine is not recommended for American Indians, [place] Natives, or people younger than 65 years unless there are medical conditions that require PPSV23 vaccine. When indicated, people who have unknown immunization and have no record of immunization should receive PPSV23 vaccine. One-time revaccination 5 years after the first dose of PPSV23 is recommended for people aged 19-64 years who have chronic kidney failure, nephrotic syndrome, asplenia, or immunocompromised conditions. People who received 1-2 doses of PPSV23 before age 65 years should receive another dose of PPSV23 vaccine at age 65 years or later if at least 5 years have passed since the previous dose. Doses of PPSV23 are not needed for people immunized with PPSV23 at or after age 65 years.  Meningococcal vaccine. Adults with asplenia or persistent complement component deficiencies should receive 2 doses of quadrivalent meningococcal conjugate (MenACWY-D) vaccine. The doses should be obtained at least 2 months apart.  Microbiologists working with certain meningococcal bacteria, military recruits, people at risk during an outbreak, and people who travel to or live in countries with a high rate of meningitis should be immunized. A first-year college student up through age   21 years who is living in a residence hall should receive a dose if she did not receive a dose on or after her 16th birthday. Adults who have certain high-risk conditions should receive one or more doses of vaccine.  Hepatitis A vaccine. Adults who wish to be protected from this disease, have certain high-risk conditions, work with hepatitis A-infected animals, work in hepatitis A research labs, or travel to or work in countries with a high rate of hepatitis A should be immunized. Adults who were previously unvaccinated and who anticipate close contact with an international adoptee during the first 60 days after arrival in the [place] from a country with a high rate of hepatitis A should be immunized.  Hepatitis B vaccine. Adults who wish to be protected from this disease, have certain high-risk conditions, may be exposed to blood or other infectious body fluids, are household contacts or sex partners of hepatitis B positive people, are clients or workers in certain care facilities, or travel to or work in countries with a high rate of hepatitis B should be immunized.  Haemophilus influenzae type b (Hib) vaccine. A previously unvaccinated person with asplenia or sickle cell disease or having a scheduled splenectomy should receive 1 dose of Hib vaccine. Regardless of previous immunization, a recipient of a hematopoietic stem cell transplant should receive a 3-dose series 6-12 months after her successful transplant. Hib vaccine is not recommended for adults with HIV infection. Preventive Services / Frequency Ages 64 to 68 years  Blood pressure check.** / Every 1 to 2 years.  Lipid and cholesterol check.** / Every 5 years beginning at age  22.  Clinical breast exam.** / Every 3 years for women in their 88s and 53s.  BRCA-related cancer risk assessment.** / For women who have family members with a BRCA-related cancer (breast, ovarian, tubal, or peritoneal cancers).  Pap test.** / Every 2 years from ages 90 through 51. Every 3 years starting at age 21 through age 56 or 3 with a history of 3 consecutive normal Pap tests.  HPV screening.** / Every 3 years from ages 24 through ages 1 to 46 with a history of 3 consecutive normal Pap tests.  Hepatitis C blood test.** / For any individual with known risks for hepatitis C.  Skin self-exam. / Monthly.  Influenza vaccine. / Every year.  Tetanus, diphtheria, and acellular pertussis (Tdap, Td) vaccine.** / Consult your health care provider. Pregnant women should receive 1 dose of Tdap vaccine during each pregnancy. 1 dose of Td every 10 years.  Varicella vaccine.** / Consult your health care provider. Pregnant females who do not have evidence of immunity should receive the first dose after pregnancy.  HPV vaccine. / 3 doses over 6 months, if 72 and younger. The vaccine is not recommended for use in pregnant females. However, pregnancy testing is not needed before receiving a dose.  Measles, mumps, rubella (MMR) vaccine.** / You need at least 1 dose of MMR if you were born in 1957 or later. You may also need a 2nd dose. For females of childbearing age, rubella immunity should be determined. If there is no evidence of immunity, females who are not pregnant should be vaccinated. If there is no evidence of immunity, females who are pregnant should delay immunization until after pregnancy.  Pneumococcal 13-valent conjugate (PCV13) vaccine.** / Consult your health care provider.  Pneumococcal polysaccharide (PPSV23) vaccine.** / 1 to 2 doses if you smoke cigarettes or if you have certain conditions.  Meningococcal vaccine.** /  1 dose if you are age 19 to 21 years and a first-year college  student living in a residence hall, or have one of several medical conditions, you need to get vaccinated against meningococcal disease. You may also need additional booster doses.  Hepatitis A vaccine.** / Consult your health care provider.  Hepatitis B vaccine.** / Consult your health care provider.  Haemophilus influenzae type b (Hib) vaccine.** / Consult your health care provider. Ages 40 to 64 years  Blood pressure check.** / Every 1 to 2 years.  Lipid and cholesterol check.** / Every 5 years beginning at age 20 years.  Lung cancer screening. / Every year if you are aged 55-80 years and have a 30-pack-year history of smoking and currently smoke or have quit within the past 15 years. Yearly screening is stopped once you have quit smoking for at least 15 years or develop a health problem that would prevent you from having lung cancer treatment.  Clinical breast exam.** / Every year after age 40 years.  BRCA-related cancer risk assessment.** / For women who have family members with a BRCA-related cancer (breast, ovarian, tubal, or peritoneal cancers).  Mammogram.** / Every year beginning at age 40 years and continuing for as long as you are in good health. Consult with your health care provider.  Pap test.** / Every 3 years starting at age 30 years through age 65 or 70 years with a history of 3 consecutive normal Pap tests.  HPV screening.** / Every 3 years from ages 30 years through ages 65 to 70 years with a history of 3 consecutive normal Pap tests.  Fecal occult blood test (FOBT) of stool. / Every year beginning at age 50 years and continuing until age 75 years. You may not need to do this test if you get a colonoscopy every 10 years.  Flexible sigmoidoscopy or colonoscopy.** / Every 5 years for a flexible sigmoidoscopy or every 10 years for a colonoscopy beginning at age 50 years and continuing until age 75 years.  Hepatitis C blood test.** / For all people born from 1945 through  1965 and any individual with known risks for hepatitis C.  Skin self-exam. / Monthly.  Influenza vaccine. / Every year.  Tetanus, diphtheria, and acellular pertussis (Tdap/Td) vaccine.** / Consult your health care provider. Pregnant women should receive 1 dose of Tdap vaccine during each pregnancy. 1 dose of Td every 10 years.  Varicella vaccine.** / Consult your health care provider. Pregnant females who do not have evidence of immunity should receive the first dose after pregnancy.  Zoster vaccine.** / 1 dose for adults aged 60 years or older.  Measles, mumps, rubella (MMR) vaccine.** / You need at least 1 dose of MMR if you were born in 1957 or later. You may also need a 2nd dose. For females of childbearing age, rubella immunity should be determined. If there is no evidence of immunity, females who are not pregnant should be vaccinated. If there is no evidence of immunity, females who are pregnant should delay immunization until after pregnancy.  Pneumococcal 13-valent conjugate (PCV13) vaccine.** / Consult your health care provider.  Pneumococcal polysaccharide (PPSV23) vaccine.** / 1 to 2 doses if you smoke cigarettes or if you have certain conditions.  Meningococcal vaccine.** / Consult your health care provider.  Hepatitis A vaccine.** / Consult your health care provider.  Hepatitis B vaccine.** / Consult your health care provider.  Haemophilus influenzae type b (Hib) vaccine.** / Consult your health care provider. Ages 65   years and over  Blood pressure check.** / Every 1 to 2 years.  Lipid and cholesterol check.** / Every 5 years beginning at age 22 years.  Lung cancer screening. / Every year if you are aged 73-80 years and have a 30-pack-year history of smoking and currently smoke or have quit within the past 15 years. Yearly screening is stopped once you have quit smoking for at least 15 years or develop a health problem that would prevent you from having lung cancer  treatment.  Clinical breast exam.** / Every year after age 4 years.  BRCA-related cancer risk assessment.** / For women who have family members with a BRCA-related cancer (breast, ovarian, tubal, or peritoneal cancers).  Mammogram.** / Every year beginning at age 40 years and continuing for as long as you are in good health. Consult with your health care provider.  Pap test.** / Every 3 years starting at age 9 years through age 34 or 91 years with 3 consecutive normal Pap tests. Testing can be stopped between 65 and 70 years with 3 consecutive normal Pap tests and no abnormal Pap or HPV tests in the past 10 years.  HPV screening.** / Every 3 years from ages 57 years through ages 64 or 45 years with a history of 3 consecutive normal Pap tests. Testing can be stopped between 65 and 70 years with 3 consecutive normal Pap tests and no abnormal Pap or HPV tests in the past 10 years.  Fecal occult blood test (FOBT) of stool. / Every year beginning at age 15 years and continuing until age 17 years. You may not need to do this test if you get a colonoscopy every 10 years.  Flexible sigmoidoscopy or colonoscopy.** / Every 5 years for a flexible sigmoidoscopy or every 10 years for a colonoscopy beginning at age 86 years and continuing until age 71 years.  Hepatitis C blood test.** / For all people born from [birth year] through 1965 and any individual with known risks for hepatitis C.  Osteoporosis screening.** / A one-time screening for women ages 83 years and over and women at risk for fractures or osteoporosis.  Skin self-exam. / Monthly.  Influenza vaccine. / Every year.  Tetanus, diphtheria, and acellular pertussis (Tdap/Td) vaccine.** / 1 dose of Td every 10 years.  Varicella vaccine.** / Consult your health care provider.  Zoster vaccine.** / 1 dose for adults aged 61 years or older.  Pneumococcal 13-valent conjugate (PCV13) vaccine.** / Consult your health care provider.  Pneumococcal  polysaccharide (PPSV23) vaccine.** / 1 dose for all adults aged 28 years and older.  Meningococcal vaccine.** / Consult your health care provider.  Hepatitis A vaccine.** / Consult your health care provider.  Hepatitis B vaccine.** / Consult your health care provider.  Haemophilus influenzae type b (Hib) vaccine.** / Consult your health care provider. ** Family history and personal history of risk and conditions may change your health care provider's recommendations. Document Released: 07/12/2001 Document Revised: 09/30/2013 Document Reviewed: 10/11/2010 [facility] Patient Information 2015 [place], [place]. This information is not intended to replace advice given to you by your health care provider. Make sure you discuss any questions you have with your health care provider.

## 2014-06-02 NOTE — Progress Notes (Signed)
Subjective:    [name] is a 74 y.o. female who presents for Medicare Annual/Subsequent preventive examination.    DIABETES  Blood Sugar ranges-107-132  Polyuria- no New Visual problems- no Hypoglycemic symptoms- no Other side effects-no Medication compliance - good Last eye exam- 06/2013 Foot exam- today     Preventive Screening-Counseling & Management  Tobacco History  Smoking status  . Never Smoker   Smokeless tobacco  . Never Used     Problems Prior to Visit 1. none  Current Problems (verified) Patient Active Problem List   Diagnosis Date Noted  . Diabetes mellitus with peripheral vascular disease 04/10/2013  . Left carotid bruit 04/10/2013  . Anemia 03/31/2013  . Degenerative arthritis of hip 06/01/2012  . POSTMENOPAUSAL SYNDROME 09/16/2009  . ARTHRALGIA 09/16/2009  . Seasonal and perennial allergic rhinitis 10/08/2007  . OSTEOPENIA 08/06/2007  . ELEVATED BLOOD PRESSURE WITHOUT DIAGNOSIS OF HYPERTENSION 08/06/2007  . HYPERLIPIDEMIA 05/14/2007  . Allergic-infective asthma 05/14/2007  . History of pulmonary embolus (PE) 08/31/2006    Medications Prior to Visit Current Outpatient Prescriptions on File Prior to Visit  Medication Sig Dispense Refill  . albuterol (PROAIR HFA) 108 (90 BASE) MCG/ACT inhaler Inhale 2 puffs into the lungs every 6 (six) hours as needed for wheezing or shortness of breath. 3 Inhaler 3  . aspirin 325 MG EC tablet Take 325 mg by mouth daily.    . calcium gluconate 500 MG tablet Take 600 mg by mouth 2 (two) times daily.     . cholecalciferol (VITAMIN D) 1000 UNITS tablet Take 1,000 Units by mouth daily. 2 pills daily    . cyclobenzaprine (FLEXERIL) 10 MG tablet Take 10 mg by mouth as needed. For Hip concerns    . docusate sodium (COLACE) 100 MG capsule Take 100-200 mg by mouth every morning.    [name] EPINEPHrine 0.3 mg/0.3 mL IJ SOAJ injection inkject into thigh for severe allergic reaction 1 Device prn  . Fluticasone-Salmeterol  (ADVAIR DISKUS) 250-50 MCG/DOSE AEPB Inhale 1 puff into the lungs 2 (two) times daily. Rinse mouth 180 each 3  . meloxicam (MOBIC) 15 MG tablet Take 1 tablet by mouth daily.  0  . metFORMIN (GLUCOPHAGE-XR) 500 MG 24 hr tablet Take 1 tablet by mouth daily.    . metFORMIN (GLUCOPHAGE-XR) 500 MG 24 hr tablet take 1 tablet by mouth once daily WITH BREAKFAST 90 tablet 1  . Multiple Vitamins-Minerals (MULTIVITAMIN) LIQD Take by mouth. Vemma    . Olopatadine HCl (PATADAY OP) Place 1 drop into both eyes daily as needed. For allergies    . omeprazole (PRILOSEC) 20 MG capsule Take 20 mg by mouth as needed. For acid reflux    . polyethylene glycol (MIRALAX / GLYCOLAX) packet Take 17 g by mouth daily.     No current facility-administered medications on file prior to visit.    Current Medications (verified) Current Outpatient Prescriptions  Medication Sig Dispense Refill  . albuterol (PROAIR HFA) 108 (90 BASE) MCG/ACT inhaler Inhale 2 puffs into the lungs every 6 (six) hours as needed for wheezing or shortness of breath. 3 Inhaler 3  . aspirin 325 MG EC tablet Take 325 mg by mouth daily.    . calcium gluconate 500 MG tablet Take 600 mg by mouth 2 (two) times daily.     . cholecalciferol (VITAMIN D) 1000 UNITS tablet Take 1,000 Units by mouth daily. 2 pills daily    . cyclobenzaprine (FLEXERIL) 10 MG tablet Take 10 mg by mouth as needed. For  Hip concerns    . docusate sodium (COLACE) 100 MG capsule Take 100-200 mg by mouth every morning.    [name] EPINEPHrine 0.3 mg/0.3 mL IJ SOAJ injection inkject into thigh for severe allergic reaction 1 Device prn  . Fluticasone-Salmeterol (ADVAIR DISKUS) 250-50 MCG/DOSE AEPB Inhale 1 puff into the lungs 2 (two) times daily. Rinse mouth 180 each 3  . meloxicam (MOBIC) 15 MG tablet Take 1 tablet by mouth daily.  0  . metFORMIN (GLUCOPHAGE-XR) 500 MG 24 hr tablet Take 1 tablet by mouth daily.    . metFORMIN (GLUCOPHAGE-XR) 500 MG 24 hr tablet take 1 tablet by mouth once daily  WITH BREAKFAST 90 tablet 1  . Multiple Vitamins-Minerals (MULTIVITAMIN) LIQD Take by mouth. Vemma    . Olopatadine HCl (PATADAY OP) Place 1 drop into both eyes daily as needed. For allergies    . omeprazole (PRILOSEC) 20 MG capsule Take 20 mg by mouth as needed. For acid reflux    . polyethylene glycol (MIRALAX / GLYCOLAX) packet Take 17 g by mouth daily.     No current facility-administered medications for this visit.     Allergies (verified) Review of patient's allergies indicates no known allergies.   PAST HISTORY  Family History Family History  Problem Relation Age of Onset  . COPD Father     emphysema  . Cancer Mother     cns cancer  . Diabetes Sister     TWIN sister ; also Fibromyalgia ; S/P stent 2004  . Heart disease Sister     stents @ 64 & 27  . Stroke Maternal Grandmother     in  late 62s  . Transient ischemic attack Paternal Aunt     Social History History  Substance Use Topics  . Smoking status: Never Smoker   . Smokeless tobacco: Never Used  . Alcohol Use: 0.0 oz/week     Comment: twice a week; 2-4/week     Are there smokers in your home (other than you)? No  Risk Factors Current exercise habits: PT for back,  home exercise  Dietary issues discussed: na   Cardiac risk factors: advanced age (older than 84 for men, 71 for women), diabetes mellitus and family history of premature cardiovascular disease.  Depression Screen (Note: if answer to either of the following is "Yes", a more complete depression screening is indicated)   Over the past two weeks, have you felt down, depressed or hopeless? No  Over the past two weeks, have you felt little interest or pleasure in doing things? No  Have you lost interest or pleasure in daily life? No  Do you often feel hopeless? No  Do you cry easily over simple problems? No  Activities of Daily Living In your present state of health, do you have any difficulty performing the following activities?:  Driving?  No Managing money?  No Feeding yourself? No Getting from bed to chair? No Climbing a flight of stairs? No Preparing food and eating?: No Bathing or showering? No Getting dressed: No Getting to the toilet? No Using the toilet:No Moving around from place to place: No In the past year have you fallen or had a near fall?:Yes-- 11/2013-- pt was on chair putting something up and accidentally stepped off chair and fell  Are you sexually active?  No  Do you have more than one partner?  No  Hearing Difficulties: No Do you often ask people to speak up or repeat themselves? No Do you experience ringing or noises  in your ears? No Do you have difficulty understanding soft or whispered voices? No   Do you feel that you have a problem with memory? No  Do you often misplace items? No  Do you feel safe at home?  Yes  Cognitive Testing  Alert? Yes  Normal Appearance?Yes  Oriented to person? Yes  Place? Yes   Time? Yes  Recall of three objects?  Yes  Can perform simple calculations? Yes  Displays appropriate judgment?Yes  Can read the correct time from a watch face?Yes   Advanced Directives have been discussed with the patient? Yes  List the Names of Other Physician/Practitioners you currently use: 1.  opth--yoackum 2.  Dentist--Burton 3.  Ortho--blackman, [place] 4.  Gyn-- ross 5. pulm--clinton 6. Derm-- jones   Indicate any recent Medical Services you may have received from other than [facility] providers in the past year (date may be approximate).  Immunization History  Administered Date(s) Administered  . Influenza Split 03/15/2012  . Influenza Whole 02/27/2009  . Influenza, High Dose Seasonal PF 03/19/2013  . Influenza,inj,Quad PF,36+ Mos 03/13/2014  . Pneumococcal Conjugate-13 05/16/2013  . Pneumococcal-Unspecified 05/16/2013  . Tdap 10/28/2010  . Zoster 05/12/2014    Screening Tests Health Maintenance  Topic Date Due  . FOOT EXAM  01/31/1951  . COLONOSCOPY  05/30/2010  .  HEMOGLOBIN A1C  10/03/2013  . URINE MICROALBUMIN  04/05/2014  . OPHTHALMOLOGY EXAM  07/22/2014  . MAMMOGRAM  10/29/2014  . INFLUENZA VACCINE  12/29/2014  . TETANUS/TDAP  10/27/2020  . DEXA SCAN  Completed  . PNEUMOCOCCAL POLYSACCHARIDE VACCINE AGE 30 AND OVER  Completed  . ZOSTAVAX  Completed    All answers were reviewed with the patient and necessary referrals were made:  Garnet Koyanagi, DO   06/02/2014   History reviewed:  She  has a past medical history of Hyperlipidemia; Hypertension (2004); DVT (deep venous thrombosis) (2006); PTE (pulmonary thromboembolism) (2006); Basal cell cancer; Granulomatous lung disease (2002); Paralysis (2006); Asthma; Diabetes mellitus (2010); GERD (gastroesophageal reflux disease); Arthritis; and Complication of anesthesia. She  does not have any pertinent problems on file. She  has past surgical history that includes Bunionectomy; Septoplasty; Tubal ligation; Colonoscopy (1992 & 2002); Rotator cuff repair (2009); epidural steroids (2006); Cervical fusion (2006); Tonsillectomy (74 years old); and Total hip arthroplasty (06/01/2012). Her family history includes COPD in her father; Cancer in her mother; Diabetes in her sister; Heart disease in her sister; Stroke in her maternal grandmother; Transient ischemic attack in her paternal aunt. She  reports that she has never smoked. She has never used smokeless tobacco. She reports that she drinks alcohol. She reports that she does not use illicit drugs. She has a current medication list which includes the following prescription(s): albuterol, aspirin, calcium gluconate, cholecalciferol, cyclobenzaprine, docusate sodium, epinephrine, fluticasone-salmeterol, meloxicam, metformin, multivitamin, olopatadine hcl, omeprazole, and polyethylene glycol. Current Outpatient Prescriptions on File Prior to Visit  Medication Sig Dispense Refill  . albuterol (PROAIR HFA) 108 (90 BASE) MCG/ACT inhaler Inhale 2 puffs into the lungs every 6  (six) hours as needed for wheezing or shortness of breath. 3 Inhaler 3  . aspirin 325 MG EC tablet Take 325 mg by mouth daily.    . calcium gluconate 500 MG tablet Take 600 mg by mouth 2 (two) times daily.     . cholecalciferol (VITAMIN D) 1000 UNITS tablet Take 1,000 Units by mouth daily. 2 pills daily    . cyclobenzaprine (FLEXERIL) 10 MG tablet Take 10 mg by mouth as needed.  For Hip concerns    . docusate sodium (COLACE) 100 MG capsule Take 100-200 mg by mouth every morning.    [name] EPINEPHrine 0.3 mg/0.3 mL IJ SOAJ injection inkject into thigh for severe allergic reaction 1 Device prn  . Fluticasone-Salmeterol (ADVAIR DISKUS) 250-50 MCG/DOSE AEPB Inhale 1 puff into the lungs 2 (two) times daily. Rinse mouth 180 each 3  . meloxicam (MOBIC) 15 MG tablet Take 1 tablet by mouth daily.  0  . Multiple Vitamins-Minerals (MULTIVITAMIN) LIQD Take by mouth. Vemma    . Olopatadine HCl (PATADAY OP) Place 1 drop into both eyes daily as needed. For allergies    . omeprazole (PRILOSEC) 20 MG capsule Take 20 mg by mouth as needed. For acid reflux    . polyethylene glycol (MIRALAX / GLYCOLAX) packet Take 17 g by mouth daily.     No current facility-administered medications on file prior to visit.   She has No Known Allergies.  Review of Systems  Review of Systems  Constitutional: Negative for activity change, appetite change and fatigue.  HENT: Negative for hearing loss, congestion, tinnitus and ear discharge.   Eyes: Negative for visual disturbance (see optho q1y -- vision corrected to 20/20 with glasses).  Respiratory: Negative for cough, chest tightness and shortness of breath.   Cardiovascular: Negative for chest pain, palpitations and leg swelling.  Gastrointestinal: Negative for abdominal pain, diarrhea, constipation and abdominal distention.  Genitourinary: Negative for urgency, frequency, decreased urine volume and difficulty urinating.  Musculoskeletal: Negative for back pain, arthralgias and  gait problem.  Skin: Negative for color change, pallor and rash.  Neurological: Negative for dizziness, light-headedness, numbness and headaches.  Hematological: Negative for adenopathy. Does not bruise/bleed easily.  Psychiatric/Behavioral: Negative for suicidal ideas, confusion, sleep disturbance, self-injury, dysphoric mood, decreased concentration and agitation.  Pt is able to read and write and can do all ADLs No risk for falling No abuse/ violence in home      Objective:     Vision by Snellen chart: opth Body mass index is 24.43 kg/(m^2). BP 120/72 mmHg  Pulse 78  Temp(Src) 97.6 F (36.4 C) (Oral)  Resp 16  Ht 5\' 4"  (1.626 m)  Wt 142 lb 6.4 oz (64.592 kg)  BMI 24.43 kg/m2  SpO2 96%  BP 120/72 mmHg  Pulse 78  Temp(Src) 97.6 F (36.4 C) (Oral)  Resp 16  Ht 5\' 4"  (1.626 m)  Wt 142 lb 6.4 oz (64.592 kg)  BMI 24.43 kg/m2  SpO2 96% General appearance: alert, cooperative, appears stated age and no distress Head: Normocephalic, without obvious abnormality, atraumatic Eyes: conjunctivae/corneas clear. PERRL, EOM's intact. Fundi benign. Ears: normal TM's and external ear canals both ears Nose: Nares normal. Septum midline. Mucosa normal. No drainage or sinus tenderness. Throat: lips, mucosa, and tongue normal; teeth and gums normal Neck: no adenopathy, no carotid bruit, no JVD, supple, symmetrical, trachea midline and thyroid not enlarged, symmetric, no tenderness/mass/nodules Back: symmetric, no curvature. ROM normal. No CVA tenderness. Lungs: clear to auscultation bilaterally Breasts: gyn Heart: regular rate and rhythm, S1, S2 normal, no murmur, click, rub or gallop Abdomen: soft, non-tender; bowel sounds normal; no masses,  no organomegaly Pelvic: deferred--- gyn Extremities: extremities normal, atraumatic, no cyanosis or edema Pulses: 2+ and symmetric Skin: Skin color, texture, turgor normal. No rashes or lesions Lymph nodes: Cervical, supraclavicular, and axillary  nodes normal. Neurologic: Alert and oriented X 3, normal strength and tone. Normal symmetric reflexes. Normal coordination and gait Sensory exam of the foot is normal, tested  with the monofilament. Good pulses, no lesions or ulcers, good peripheral pulses.       Assessment:     cpe      Plan:     During the course of the visit the patient was educated and counseled about appropriate screening and preventive services including:    Pneumococcal vaccine   Influenza vaccine  Td vaccine  Screening mammography  Screening Pap smear and pelvic exam   Bone densitometry screening  Colorectal cancer screening  Diabetes screening  Glaucoma screening  Advanced directives: has an advanced directive - a copy HAS NOT been provided.  Diet review for nutrition referral? Yes ____  Not Indicated __x__   Patient Instructions (the written plan) was given to the patient.  Medicare Attestation I have personally reviewed: The patient's medical and social history Their use of alcohol, tobacco or illicit drugs Their current medications and supplements The patient's functional ability including ADLs,fall risks, home safety risks, cognitive, and hearing and visual impairment Diet and physical activities Evidence for depression or mood disorders  The patient's weight, height, BMI, and visual acuity have been recorded in the chart.  I have made referrals, counseling, and provided education to the patient based on review of the above and I have provided the patient with a written personalized care plan for preventive services.    1. Diabetes mellitus type II, controlled   - metFORMIN (GLUCOPHAGE-XR) 500 MG 24 hr tablet; take 1 tablet by mouth once daily WITH BREAKFAST  Dispense: 90 tablet; Refill: 3 - Basic metabolic panel - Hemoglobin A1c - Microalbumin / creatinine urine ratio - POCT urinalysis dipstick - CBC with Differential  2. Asthma, chronic, mild intermittent,  uncomplicated Stable-- per pulm  3. Hyperlipidemia LDL goal <70 Check labs, on no meds - Hepatic function panel - Lipid panel - Microalbumin / creatinine urine ratio - POCT urinalysis dipstick - CBC with Differential  4. Preventative health care     Garnet Koyanagi, DO   06/02/2014

## 2014-06-02 NOTE — Progress Notes (Signed)
Pre visit review using our clinic review tool, if applicable. No additional management support is needed unless otherwise documented below in the visit note. 

## 2014-06-02 NOTE — Addendum Note (Signed)
Addended by: Harl Bowie on: 06/02/2014 10:08 AM   Modules accepted: Orders

## 2014-06-03 LAB — URINE CULTURE
COLONY COUNT: NO GROWTH
Organism ID, Bacteria: NO GROWTH

## 2014-06-05 ENCOUNTER — Telehealth: Payer: Self-pay

## 2014-06-05 MED ORDER — SIMVASTATIN 20 MG PO TABS: 20.0000 mg | ORAL_TABLET | Freq: Every day | ORAL | Status: DC

## 2014-06-05 NOTE — Telephone Encounter (Signed)
Cholesterol--- LDL goal < 70, HDL >40, TG < 150. Diet and exercise will increase HDL and decrease LDL and TG. Fish, Fish Oil, Flaxseed oil will also help increase the HDL and decrease Triglycerides.  Recheck labs in 3 months----- goal of LDL in diabetes is 70 and for Triglycerides < 150. ---- start zocor 20 mg #30 1 po hs, 2 refills.  + anemia---take MVI with iron daily Dm is controlled but hgba1c is creeping up. Watch simple sugars and starches.  Repeat all in 3 months----dm II, hyperlipidemia, anemia--- Lipid, hep, bmp, hgb1c,cbcd, ibc, ferritin

## 2014-06-05 NOTE — Telephone Encounter (Signed)
Lauralye Kinn [phone]  Returned your call

## 2014-06-05 NOTE — Telephone Encounter (Signed)
Msg left to call the office     KP 

## 2014-06-05 NOTE — Telephone Encounter (Signed)
Discussed with patient and she voiced understanding, she has agreed to start Zocor 20 mg.  She will take the multivitamin with iron and will watch her hgba1c. She will recheck in 3 mos.       KP

## 2014-06-15 ENCOUNTER — Ambulatory Visit
Admission: RE | Admit: 2014-06-15 | Discharge: 2014-06-15 | Disposition: A | Discharge status: Home / Self Care | Payer: Medicare Other | Source: Ambulatory Visit | Attending: Orthopedic Surgery | Admitting: Orthopedic Surgery

## 2014-06-15 DIAGNOSIS — M25511 Pain in right shoulder: Secondary | ICD-10-CM

## 2014-06-15 DIAGNOSIS — S46811A Strain of other muscles, fascia and tendons at shoulder and upper arm level, right arm, initial encounter: Secondary | ICD-10-CM | POA: Diagnosis not present

## 2014-06-17 ENCOUNTER — Ambulatory Visit (INDEPENDENT_AMBULATORY_CARE_PROVIDER_SITE_OTHER): Payer: Medicare Other | Admitting: Family Medicine

## 2014-06-17 ENCOUNTER — Telehealth: Payer: Self-pay | Admitting: *Deleted

## 2014-06-17 ENCOUNTER — Encounter: Payer: Self-pay | Admitting: Family Medicine

## 2014-06-17 VITALS — BP 132/80 | HR 94 | Temp 98.3°F | Wt 141.0 lb

## 2014-06-17 DIAGNOSIS — M609 Myositis, unspecified: Secondary | ICD-10-CM | POA: Diagnosis not present

## 2014-06-17 DIAGNOSIS — M791 Myalgia: Secondary | ICD-10-CM

## 2014-06-17 DIAGNOSIS — IMO0001 Reserved for inherently not codable concepts without codable children: Secondary | ICD-10-CM

## 2014-06-17 NOTE — Telephone Encounter (Signed)
Called patient who verified that she has stopped taking the medication since yesterday (06/16/14).  Patient clarified that she has a history of left leg paralysis (from a surgery in 2006) and that she was now experiencing tingling on the right.  She stated that symptoms worsen with exercise (walked on a treadmill yesterday).  She used a cane on Sunday but her symptoms have improved since then and she is feeling better today.  She would still like to come in to verify if the weakness could be a medication reaction.      eal

## 2014-06-17 NOTE — Progress Notes (Signed)
   Subjective:    Patient ID: [name], female    DOB: 11-06-1940, 74 y.o.   MRN: [id number]  HPI Pt here c/o severe muscle aches and weakness that started 2 days after starting zocor and con't to worsen.    Review of Systems  Constitutional: Negative for fever, chills, diaphoresis, activity change, appetite change, fatigue and unexpected weight change.  Musculoskeletal: Positive for myalgias, back pain and gait problem. Negative for joint swelling and arthralgias.  Neurological: Positive for weakness. Negative for tremors, syncope and numbness.     Objective:   Physical Exam .BP 132/80 mmHg  Pulse 94  Temp(Src) 98.3 F (36.8 C) (Oral)  Wt 141 lb (63.957 kg)  SpO2 98% General appearance: alert, cooperative, appears stated age and no distress Extremities: extremities normal, atraumatic, no cyanosis or edema       Assessment & Plan:  1. Myalgia and myositis Stop zocor, check labs Consider zetia  Pt will call in 10-14 days to let us know if pains gone - Basic metabolic panel; Future - Hepatic function panel; Future - CK (Creatine Kinase); Future

## 2014-06-17 NOTE — Telephone Encounter (Signed)
Team [place] report:   Caller states she is on a new medication (simvastatin 20mg ) and it has made her cripple.  She has been taking the medication since 06/05/14 and began to have symptoms with walking.  She has had to use a cane; she reports that she has paralysis on the left side and is having problems with the right leg, and she is also having problems with constipation.  She has been taking Miralax and prunes.  She also reports tingling in the right leg and spine.    Verified that patient has appointment with Dr. Etter Sjogren at 6:00pm today.

## 2014-06-17 NOTE — Telephone Encounter (Signed)
Stop zocor

## 2014-06-17 NOTE — Progress Notes (Signed)
Pre visit review using our clinic review tool, if applicable. No additional management support is needed unless otherwise documented below in the visit note. 

## 2014-06-17 NOTE — Patient Instructions (Signed)
Simvastatin tablets What is this medicine? SIMVASTATIN (SIM va stat in) is known as a HMG-CoA reductase inhibitor or 'statin'. It lowers the level of cholesterol and triglycerides in the blood. This drug may also reduce the risk of heart attack, stroke, or other health problems in patients with risk factors for heart or blood vessel disease. Diet and lifestyle changes are often used with this drug. This medicine may be used for other purposes; ask your health care provider or pharmacist if you have questions. COMMON BRAND NAME(S): Zocor What should I tell my health care provider before I take this medicine? They need to know if you have any of these conditions: -frequently drink alcoholic beverages -kidney disease -liver disease -muscle aches or weakness -other medical condition -an unusual or allergic reaction to simvastatin, other medicines, foods, dyes, or preservatives -pregnant or trying to get pregnant -breast-feeding How should I use this medicine? Take this medicine by mouth with a glass of water. Follow the directions on the prescription label. You can take this medicine with or without food. Take your doses at regular intervals. Do not take your medicine more often than directed. Talk to your pediatrician regarding the use of this medicine in children. Special care may be needed. Overdosage: If you think you have taken too much of this medicine contact a poison control center or emergency room at once. NOTE: This medicine is only for you. Do not share this medicine with others. What if I miss a dose? If you miss a dose, take it as soon as you can. If it is almost time for your next dose, take only that dose. Do not take double or extra doses. What may interact with this medicine? Do not take this medicine with any of the following medications: -boceprevir -cyclosporine -danazol -gemfibrozil -medicines for fungal infections like itraconazole, ketoconazole, posaconazole,  voriconazole -medicines for HIV infection -mifepristone, RU-486 -nefazodone -red yeast rice -some antibiotics like clarithromycin, erythromycin, telithromycin -telaprevir This medicine may also interact with the following medications: -alcohol -amiodarone -amlodipine -colchicine -digoxin -diltiazem -dronedarone -fluconazole -grapefruit juice -niacin -ranolazine -verapamil -warfarin This list may not describe all possible interactions. Give your health care provider a list of all the medicines, herbs, non-prescription drugs, or dietary supplements you use. Also tell them if you smoke, drink alcohol, or use illegal drugs. Some items may interact with your medicine. What should I watch for while using this medicine? Visit your doctor or health care professional for regular check-ups. You may need regular tests to make sure your liver is working properly. Tell your doctor or health care professional right away if you get any unexplained muscle pain, tenderness, or weakness, especially if you also have a fever and tiredness. Your doctor or health care professional may tell you to stop taking this medicine if you develop muscle problems. If your muscle problems do not go away after stopping this medicine, contact your health care professional. This medicine may affect blood sugar levels. If you have diabetes, check with your doctor or health care professional before you change your diet or the dose of your diabetic medicine. This drug is only part of a total heart-health program. Your doctor or a dietician can suggest a low-cholesterol and low-fat diet to help. Avoid alcohol and smoking, and keep a proper exercise schedule. Do not use this drug if you are pregnant or breast-feeding. Serious side effects to an unborn child or to an infant are possible. Talk to your doctor or pharmacist for more information. If you  are going to have surgery tell your health care professional that you are taking  this drug. Some drugs may increase the risk of side effects from simvastatin. If you are given certain antibiotics or antifungals, your doctor or health care professional may stop simvastatin for a short time. Check with your doctor or pharmacist for advice. What side effects may I notice from receiving this medicine? Side effects that you should report to your doctor or health care professional as soon as possible: -allergic reactions like skin rash, itching or hives, swelling of the face, lips, or tongue -confusion -dark urine -fever -joint pain -loss of memory -muscle cramps, pain -redness, blistering, peeling or loosening of the skin, including inside the mouth -trouble passing urine or change in the amount of urine -unusually weak or tired -yellowing of the eyes or skin Side effects that usually do not require medical attention (report to your doctor or health care professional if they continue or are bothersome): -constipation -heartburn -stomach gas, pain, upset This list may not describe all possible side effects. Call your doctor for medical advice about side effects. You may report side effects to FDA at 1-800-FDA-1088. Where should I keep my medicine? Keep out of the reach of children. Store at room temperature below 30 degrees C (86 degrees F). Keep container tightly closed. Throw away any unused medicine after the expiration date. NOTE: This sheet is a summary. It may not cover all possible information. If you have questions about this medicine, talk to your doctor, pharmacist, or health care provider.  2015, [employer]. (2011-04-05 10:40:36)

## 2014-06-18 ENCOUNTER — Other Ambulatory Visit (INDEPENDENT_AMBULATORY_CARE_PROVIDER_SITE_OTHER): Payer: Medicare Other

## 2014-06-18 DIAGNOSIS — M609 Myositis, unspecified: Secondary | ICD-10-CM | POA: Diagnosis not present

## 2014-06-18 DIAGNOSIS — M791 Myalgia: Secondary | ICD-10-CM

## 2014-06-18 DIAGNOSIS — M25511 Pain in right shoulder: Secondary | ICD-10-CM | POA: Diagnosis not present

## 2014-06-18 DIAGNOSIS — IMO0001 Reserved for inherently not codable concepts without codable children: Secondary | ICD-10-CM

## 2014-06-18 LAB — BASIC METABOLIC PANEL
BUN: 20 mg/dL (ref 6–23)
CO2: 29 mEq/L (ref 19–32)
CREATININE: 0.85 mg/dL (ref 0.40–1.20)
Calcium: 9.9 mg/dL (ref 8.4–10.5)
Chloride: 104 mEq/L (ref 96–112)
GFR: 69.61 mL/min (ref >60.00)
Glucose, Bld: 111 mg/dL — ABNORMAL HIGH (ref 70–99)
Potassium: 4.2 mEq/L (ref 3.5–5.1)
Sodium: 137 mEq/L (ref 135–145)

## 2014-06-18 LAB — CK: CK TOTAL: 75 U/L (ref 7–177)

## 2014-06-18 LAB — HEPATIC FUNCTION PANEL
ALT: 15 U/L (ref 0–35)
AST: 17 U/L (ref 0–37)
Albumin: 4.1 g/dL (ref 3.5–5.2)
Alkaline Phosphatase: 82 U/L (ref 39–117)
BILIRUBIN TOTAL: 0.3 mg/dL (ref 0.2–1.2)
Bilirubin, Direct: 0.1 mg/dL (ref 0.0–0.3)
Total Protein: 7.3 g/dL (ref 6.0–8.3)

## 2014-06-26 DIAGNOSIS — M75121 Complete rotator cuff tear or rupture of right shoulder, not specified as traumatic: Secondary | ICD-10-CM | POA: Diagnosis not present

## 2014-07-01 DIAGNOSIS — M75121 Complete rotator cuff tear or rupture of right shoulder, not specified as traumatic: Secondary | ICD-10-CM | POA: Diagnosis not present

## 2014-07-03 DIAGNOSIS — M75121 Complete rotator cuff tear or rupture of right shoulder, not specified as traumatic: Secondary | ICD-10-CM | POA: Diagnosis not present

## 2014-07-10 ENCOUNTER — Other Ambulatory Visit: Payer: Self-pay

## 2014-07-10 MED ORDER — ONETOUCH DELICA LANCETS FINE MISC: Status: DC

## 2014-07-10 MED ORDER — GLUCOSE BLOOD VI STRP: ORAL_STRIP | Status: DC

## 2014-07-14 ENCOUNTER — Telehealth: Payer: Self-pay | Admitting: Internal Medicine

## 2014-07-14 MED ORDER — AMOXICILLIN-POT CLAVULANATE 875-125 MG PO TABS: 1.0000 | ORAL_TABLET | Freq: Two times a day (BID) | ORAL | Status: DC

## 2014-07-14 NOTE — Telephone Encounter (Signed)
Called and spoke with pt and she stated that she started with a sinus infection on Saturday and today she is now coughing up yellow sputum.  She didn't know if CY wanted to see her or if he would want to call her something in.  She did state that she has a hx of asthma.  CY please advise. Thanks   Allergies  Allergen Reactions  . Statins Other (See Comments)    Myalgias and muscle weakness   Current Outpatient Prescriptions on File Prior to Visit  Medication Sig Dispense Refill  . albuterol (PROAIR HFA) 108 (90 BASE) MCG/ACT inhaler Inhale 2 puffs into the lungs every 6 (six) hours as needed for wheezing or shortness of breath. 3 Inhaler 3  . aspirin 325 MG EC tablet Take 325 mg by mouth daily.    . calcium gluconate 500 MG tablet Take 600 mg by mouth 2 (two) times daily.     . cholecalciferol (VITAMIN D) 1000 UNITS tablet Take 1,000 Units by mouth daily. 2 pills daily    . cyclobenzaprine (FLEXERIL) 10 MG tablet Take 10 mg by mouth as needed. For Hip concerns    . docusate sodium (COLACE) 100 MG capsule Take 100-200 mg by mouth every morning.    [name] EPINEPHrine 0.3 mg/0.3 mL IJ SOAJ injection inkject into thigh for severe allergic reaction 1 Device prn  . Fluticasone-Salmeterol (ADVAIR DISKUS) 250-50 MCG/DOSE AEPB Inhale 1 puff into the lungs 2 (two) times daily. Rinse mouth 180 each 3  . glucose blood (ONETOUCH VERIO) test strip Check blood sugar twice daily 200 each 3  . meloxicam (MOBIC) 15 MG tablet Take 1 tablet by mouth daily.  0  . metFORMIN (GLUCOPHAGE-XR) 500 MG 24 hr tablet take 1 tablet by mouth once daily WITH BREAKFAST 90 tablet 3  . Multiple Vitamins-Minerals (MULTIVITAMIN) LIQD Take by mouth. Vemma    . Olopatadine HCl (PATADAY OP) Place 1 drop into both eyes daily as needed. For allergies    . omeprazole (PRILOSEC) 20 MG capsule Take 20 mg by mouth as needed. For acid reflux    . ONETOUCH DELICA LANCETS FINE MISC Check blood sugar twice daily 200 each 3  . polyethylene glycol  (MIRALAX / GLYCOLAX) packet Take 17 g by mouth daily.     No current facility-administered medications on file prior to visit.

## 2014-07-14 NOTE — Telephone Encounter (Signed)
Offer augmentin 875, # 14, 1 twice daily, ref x 1

## 2014-07-14 NOTE — Telephone Encounter (Signed)
Called and spoke with pt and she is aware of CY recs.  Nothing further is needed and nothing further is needed.

## 2014-07-16 DIAGNOSIS — M4726 Other spondylosis with radiculopathy, lumbar region: Secondary | ICD-10-CM | POA: Diagnosis not present

## 2014-07-17 ENCOUNTER — Other Ambulatory Visit: Payer: Self-pay | Admitting: Physical Medicine and Rehabilitation

## 2014-07-17 DIAGNOSIS — M545 Low back pain: Secondary | ICD-10-CM

## 2014-07-17 DIAGNOSIS — M75121 Complete rotator cuff tear or rupture of right shoulder, not specified as traumatic: Secondary | ICD-10-CM | POA: Diagnosis not present

## 2014-07-22 DIAGNOSIS — H40013 Open angle with borderline findings, low risk, bilateral: Secondary | ICD-10-CM | POA: Diagnosis not present

## 2014-07-22 DIAGNOSIS — H2513 Age-related nuclear cataract, bilateral: Secondary | ICD-10-CM | POA: Diagnosis not present

## 2014-07-22 DIAGNOSIS — E119 Type 2 diabetes mellitus without complications: Secondary | ICD-10-CM | POA: Diagnosis not present

## 2014-08-01 ENCOUNTER — Other Ambulatory Visit: Payer: Medicare Other

## 2014-08-10 ENCOUNTER — Ambulatory Visit
Admission: RE | Admit: 2014-08-10 | Discharge: 2014-08-10 | Disposition: A | Discharge status: Home / Self Care | Payer: Medicare Other | Source: Ambulatory Visit | Attending: Physical Medicine and Rehabilitation | Admitting: Physical Medicine and Rehabilitation

## 2014-08-10 ENCOUNTER — Other Ambulatory Visit: Payer: Medicare Other

## 2014-08-10 DIAGNOSIS — M5127 Other intervertebral disc displacement, lumbosacral region: Secondary | ICD-10-CM | POA: Diagnosis not present

## 2014-08-10 DIAGNOSIS — M4186 Other forms of scoliosis, lumbar region: Secondary | ICD-10-CM | POA: Diagnosis not present

## 2014-08-10 DIAGNOSIS — M4317 Spondylolisthesis, lumbosacral region: Secondary | ICD-10-CM | POA: Diagnosis not present

## 2014-08-10 DIAGNOSIS — M545 Low back pain: Secondary | ICD-10-CM

## 2014-08-10 DIAGNOSIS — M47816 Spondylosis without myelopathy or radiculopathy, lumbar region: Secondary | ICD-10-CM | POA: Diagnosis not present

## 2014-08-13 DIAGNOSIS — G609 Hereditary and idiopathic neuropathy, unspecified: Secondary | ICD-10-CM | POA: Diagnosis not present

## 2014-08-13 DIAGNOSIS — R202 Paresthesia of skin: Secondary | ICD-10-CM | POA: Diagnosis not present

## 2014-08-21 ENCOUNTER — Ambulatory Visit: Payer: Medicare Other | Admitting: Family Medicine

## 2014-09-02 DIAGNOSIS — M9903 Segmental and somatic dysfunction of lumbar region: Secondary | ICD-10-CM | POA: Diagnosis not present

## 2014-09-02 DIAGNOSIS — M4726 Other spondylosis with radiculopathy, lumbar region: Secondary | ICD-10-CM | POA: Diagnosis not present

## 2014-09-02 DIAGNOSIS — M9902 Segmental and somatic dysfunction of thoracic region: Secondary | ICD-10-CM | POA: Diagnosis not present

## 2014-09-02 DIAGNOSIS — M9901 Segmental and somatic dysfunction of cervical region: Secondary | ICD-10-CM | POA: Diagnosis not present

## 2014-09-03 DIAGNOSIS — M9901 Segmental and somatic dysfunction of cervical region: Secondary | ICD-10-CM | POA: Diagnosis not present

## 2014-09-03 DIAGNOSIS — M9903 Segmental and somatic dysfunction of lumbar region: Secondary | ICD-10-CM | POA: Diagnosis not present

## 2014-09-03 DIAGNOSIS — M9902 Segmental and somatic dysfunction of thoracic region: Secondary | ICD-10-CM | POA: Diagnosis not present

## 2014-09-03 DIAGNOSIS — M4726 Other spondylosis with radiculopathy, lumbar region: Secondary | ICD-10-CM | POA: Diagnosis not present

## 2014-09-10 DIAGNOSIS — M9902 Segmental and somatic dysfunction of thoracic region: Secondary | ICD-10-CM | POA: Diagnosis not present

## 2014-09-10 DIAGNOSIS — M9901 Segmental and somatic dysfunction of cervical region: Secondary | ICD-10-CM | POA: Diagnosis not present

## 2014-09-10 DIAGNOSIS — M4726 Other spondylosis with radiculopathy, lumbar region: Secondary | ICD-10-CM | POA: Diagnosis not present

## 2014-09-10 DIAGNOSIS — M9903 Segmental and somatic dysfunction of lumbar region: Secondary | ICD-10-CM | POA: Diagnosis not present

## 2014-09-11 DIAGNOSIS — M9902 Segmental and somatic dysfunction of thoracic region: Secondary | ICD-10-CM | POA: Diagnosis not present

## 2014-09-11 DIAGNOSIS — M9903 Segmental and somatic dysfunction of lumbar region: Secondary | ICD-10-CM | POA: Diagnosis not present

## 2014-09-11 DIAGNOSIS — M9901 Segmental and somatic dysfunction of cervical region: Secondary | ICD-10-CM | POA: Diagnosis not present

## 2014-09-11 DIAGNOSIS — M4726 Other spondylosis with radiculopathy, lumbar region: Secondary | ICD-10-CM | POA: Diagnosis not present

## 2014-09-15 DIAGNOSIS — M9902 Segmental and somatic dysfunction of thoracic region: Secondary | ICD-10-CM | POA: Diagnosis not present

## 2014-09-15 DIAGNOSIS — M4726 Other spondylosis with radiculopathy, lumbar region: Secondary | ICD-10-CM | POA: Diagnosis not present

## 2014-09-15 DIAGNOSIS — M9901 Segmental and somatic dysfunction of cervical region: Secondary | ICD-10-CM | POA: Diagnosis not present

## 2014-09-15 DIAGNOSIS — M9903 Segmental and somatic dysfunction of lumbar region: Secondary | ICD-10-CM | POA: Diagnosis not present

## 2014-09-17 DIAGNOSIS — M9901 Segmental and somatic dysfunction of cervical region: Secondary | ICD-10-CM | POA: Diagnosis not present

## 2014-09-17 DIAGNOSIS — M4726 Other spondylosis with radiculopathy, lumbar region: Secondary | ICD-10-CM | POA: Diagnosis not present

## 2014-09-17 DIAGNOSIS — M9903 Segmental and somatic dysfunction of lumbar region: Secondary | ICD-10-CM | POA: Diagnosis not present

## 2014-09-17 DIAGNOSIS — M9902 Segmental and somatic dysfunction of thoracic region: Secondary | ICD-10-CM | POA: Diagnosis not present

## 2014-09-18 DIAGNOSIS — Z1231 Encounter for screening mammogram for malignant neoplasm of breast: Secondary | ICD-10-CM | POA: Diagnosis not present

## 2014-09-18 DIAGNOSIS — Z01419 Encounter for gynecological examination (general) (routine) without abnormal findings: Secondary | ICD-10-CM | POA: Diagnosis not present

## 2014-09-22 DIAGNOSIS — M9902 Segmental and somatic dysfunction of thoracic region: Secondary | ICD-10-CM | POA: Diagnosis not present

## 2014-09-22 DIAGNOSIS — M9903 Segmental and somatic dysfunction of lumbar region: Secondary | ICD-10-CM | POA: Diagnosis not present

## 2014-09-22 DIAGNOSIS — M9901 Segmental and somatic dysfunction of cervical region: Secondary | ICD-10-CM | POA: Diagnosis not present

## 2014-09-22 DIAGNOSIS — M4726 Other spondylosis with radiculopathy, lumbar region: Secondary | ICD-10-CM | POA: Diagnosis not present

## 2014-09-23 DIAGNOSIS — M9903 Segmental and somatic dysfunction of lumbar region: Secondary | ICD-10-CM | POA: Diagnosis not present

## 2014-09-23 DIAGNOSIS — M9901 Segmental and somatic dysfunction of cervical region: Secondary | ICD-10-CM | POA: Diagnosis not present

## 2014-09-23 DIAGNOSIS — M9902 Segmental and somatic dysfunction of thoracic region: Secondary | ICD-10-CM | POA: Diagnosis not present

## 2014-09-23 DIAGNOSIS — M4726 Other spondylosis with radiculopathy, lumbar region: Secondary | ICD-10-CM | POA: Diagnosis not present

## 2014-09-25 DIAGNOSIS — M9901 Segmental and somatic dysfunction of cervical region: Secondary | ICD-10-CM | POA: Diagnosis not present

## 2014-09-25 DIAGNOSIS — M9903 Segmental and somatic dysfunction of lumbar region: Secondary | ICD-10-CM | POA: Diagnosis not present

## 2014-09-25 DIAGNOSIS — M9902 Segmental and somatic dysfunction of thoracic region: Secondary | ICD-10-CM | POA: Diagnosis not present

## 2014-09-25 DIAGNOSIS — M4726 Other spondylosis with radiculopathy, lumbar region: Secondary | ICD-10-CM | POA: Diagnosis not present

## 2014-09-29 ENCOUNTER — Ambulatory Visit (INDEPENDENT_AMBULATORY_CARE_PROVIDER_SITE_OTHER): Payer: Medicare Other | Admitting: Family Medicine

## 2014-09-29 ENCOUNTER — Encounter: Payer: Self-pay | Admitting: Family Medicine

## 2014-09-29 VITALS — BP 122/70 | HR 74 | Temp 98.3°F | Resp 18 | Ht 64.0 in | Wt 138.0 lb

## 2014-09-29 DIAGNOSIS — M545 Low back pain, unspecified: Secondary | ICD-10-CM | POA: Insufficient documentation

## 2014-09-29 DIAGNOSIS — M5440 Lumbago with sciatica, unspecified side: Secondary | ICD-10-CM

## 2014-09-29 DIAGNOSIS — E119 Type 2 diabetes mellitus without complications: Secondary | ICD-10-CM | POA: Diagnosis not present

## 2014-09-29 DIAGNOSIS — E785 Hyperlipidemia, unspecified: Secondary | ICD-10-CM | POA: Diagnosis not present

## 2014-09-29 DIAGNOSIS — Z0000 Encounter for general adult medical examination without abnormal findings: Secondary | ICD-10-CM

## 2014-09-29 DIAGNOSIS — Z23 Encounter for immunization: Secondary | ICD-10-CM | POA: Diagnosis not present

## 2014-09-29 MED ORDER — CYCLOBENZAPRINE HCL 10 MG PO TABS: 10.0000 mg | ORAL_TABLET | ORAL | Status: DC | PRN

## 2014-09-29 MED ORDER — METFORMIN HCL ER 500 MG PO TB24: ORAL_TABLET | ORAL | Status: DC

## 2014-09-29 MED ORDER — GLUCOSE BLOOD VI STRP: ORAL_STRIP | Status: DC

## 2014-09-29 MED ORDER — ONETOUCH DELICA LANCETS FINE MISC: Status: DC

## 2014-09-29 NOTE — Patient Instructions (Signed)

## 2014-09-29 NOTE — Progress Notes (Signed)
Subjective:    [name] is a 74 y.o. female who presents for Medicare Annual/Subsequent preventive examination.  Preventive Screening-Counseling & Management  Tobacco History  Smoking status  . Never Smoker   Smokeless tobacco  . Never Used     Problems Prior to Visit 1. Back pain - has n/s appointment appointment pending  Current Problems (verified) Patient Active Problem List   Diagnosis Date Noted  . Diabetes mellitus with peripheral vascular disease 04/10/2013  . Left carotid bruit 04/10/2013  . Anemia 03/31/2013  . Degenerative arthritis of hip 06/01/2012  . POSTMENOPAUSAL SYNDROME 09/16/2009  . ARTHRALGIA 09/16/2009  . Seasonal and perennial allergic rhinitis 10/08/2007  . OSTEOPENIA 08/06/2007  . ELEVATED BLOOD PRESSURE WITHOUT DIAGNOSIS OF HYPERTENSION 08/06/2007  . HYPERLIPIDEMIA 05/14/2007  . Allergic-infective asthma 05/14/2007  . History of pulmonary embolus (PE) 08/31/2006    Medications Prior to Visit Current Outpatient Prescriptions on File Prior to Visit  Medication Sig Dispense Refill  . albuterol (PROAIR HFA) 108 (90 BASE) MCG/ACT inhaler Inhale 2 puffs into the lungs every 6 (six) hours as needed for wheezing or shortness of breath. 3 Inhaler 3  . amoxicillin-clavulanate (AUGMENTIN) 875-125 MG per tablet Take 1 tablet by mouth 2 (two) times daily. 14 tablet 1  . aspirin 325 MG EC tablet Take 325 mg by mouth daily.    . calcium gluconate 500 MG tablet Take 600 mg by mouth 2 (two) times daily.     . cholecalciferol (VITAMIN D) 1000 UNITS tablet Take 1,000 Units by mouth daily. 2 pills daily    . cyclobenzaprine (FLEXERIL) 10 MG tablet Take 10 mg by mouth as needed. For Hip concerns    . docusate sodium (COLACE) 100 MG capsule Take 100-200 mg by mouth every morning.    [name] EPINEPHrine 0.3 mg/0.3 mL IJ SOAJ injection inkject into thigh for severe allergic reaction 1 Device prn  . Fluticasone-Salmeterol (ADVAIR DISKUS) 250-50 MCG/DOSE AEPB Inhale 1 puff  into the lungs 2 (two) times daily. Rinse mouth 180 each 3  . glucose blood (ONETOUCH VERIO) test strip Check blood sugar twice daily 200 each 3  . meloxicam (MOBIC) 15 MG tablet Take 1 tablet by mouth daily.  0  . metFORMIN (GLUCOPHAGE-XR) 500 MG 24 hr tablet take 1 tablet by mouth once daily WITH BREAKFAST 90 tablet 3  . Multiple Vitamins-Minerals (MULTIVITAMIN) LIQD Take by mouth. Vemma    . Olopatadine HCl (PATADAY OP) Place 1 drop into both eyes daily as needed. For allergies    . omeprazole (PRILOSEC) 20 MG capsule Take 20 mg by mouth as needed. For acid reflux    . ONETOUCH DELICA LANCETS FINE MISC Check blood sugar twice daily 200 each 3  . polyethylene glycol (MIRALAX / GLYCOLAX) packet Take 17 g by mouth daily.     No current facility-administered medications on file prior to visit.    Current Medications (verified) Current Outpatient Prescriptions  Medication Sig Dispense Refill  . albuterol (PROAIR HFA) 108 (90 BASE) MCG/ACT inhaler Inhale 2 puffs into the lungs every 6 (six) hours as needed for wheezing or shortness of breath. 3 Inhaler 3  . amoxicillin-clavulanate (AUGMENTIN) 875-125 MG per tablet Take 1 tablet by mouth 2 (two) times daily. 14 tablet 1  . aspirin 325 MG EC tablet Take 325 mg by mouth daily.    . calcium gluconate 500 MG tablet Take 600 mg by mouth 2 (two) times daily.     . cholecalciferol (VITAMIN D) 1000 UNITS tablet  Take 1,000 Units by mouth daily. 2 pills daily    . cyclobenzaprine (FLEXERIL) 10 MG tablet Take 10 mg by mouth as needed. For Hip concerns    . docusate sodium (COLACE) 100 MG capsule Take 100-200 mg by mouth every morning.    [name] EPINEPHrine 0.3 mg/0.3 mL IJ SOAJ injection inkject into thigh for severe allergic reaction 1 Device prn  . Fluticasone-Salmeterol (ADVAIR DISKUS) 250-50 MCG/DOSE AEPB Inhale 1 puff into the lungs 2 (two) times daily. Rinse mouth 180 each 3  . gabapentin (NEURONTIN) 300 MG capsule   1  . glucose blood (ONETOUCH VERIO)  test strip Check blood sugar twice daily 200 each 3  . meloxicam (MOBIC) 15 MG tablet Take 1 tablet by mouth daily.  0  . metFORMIN (GLUCOPHAGE-XR) 500 MG 24 hr tablet take 1 tablet by mouth once daily WITH BREAKFAST 90 tablet 3  . Multiple Vitamins-Minerals (MULTIVITAMIN) LIQD Take by mouth. Vemma    . olopatadine (PATANOL) 0.1 % ophthalmic solution   12  . Olopatadine HCl (PATADAY OP) Place 1 drop into both eyes daily as needed. For allergies    . omeprazole (PRILOSEC) 20 MG capsule Take 20 mg by mouth as needed. For acid reflux    . ONETOUCH DELICA LANCETS FINE MISC Check blood sugar twice daily 200 each 3  . polyethylene glycol (MIRALAX / GLYCOLAX) packet Take 17 g by mouth daily.     No current facility-administered medications for this visit.     Allergies (verified) Statins   PAST HISTORY  Family History Family History  Problem Relation Age of Onset  . COPD Father     emphysema  . Cancer Mother     cns cancer  . Diabetes Sister     TWIN sister ; also Fibromyalgia ; S/P stent 2004  . Heart disease Sister     stents @ 57 & 102  . Stroke Maternal Grandmother     in  late 35s  . Transient ischemic attack Paternal Aunt     Social History History  Substance Use Topics  . Smoking status: Never Smoker   . Smokeless tobacco: Never Used  . Alcohol Use: 0.0 oz/week     Comment: twice a week; 2-4/week     Are there smokers in your home (other than you)? No  Risk Factors Current exercise habits: The patient does not participate in regular exercise at present.  Dietary issues discussed: none  Cardiac risk factors: advanced age (older than 97 for men, 38 for women), dyslipidemia and sedentary lifestyle.  Depression Screen (Note: if answer to either of the following is "Yes", a more complete depression screening is indicated)   Over the past two weeks, have you felt down, depressed or hopeless? No  Over the past two weeks, have you felt little interest or pleasure in doing  things? No  Have you lost interest or pleasure in daily life? No  Do you often feel hopeless? No  Do you cry easily over simple problems? No  Activities of Daily Living In your present state of health, do you have any difficulty performing the following activities?:  Driving? No Managing money?  No Feeding yourself? No Getting from bed to chair? No Climbing a flight of stairs? No Preparing food and eating?: No Bathing or showering? No Getting dressed: No Getting to the toilet? No Using the toilet:No Moving around from place to place: No In the past year have you fallen or had a near fall?:No  Are you sexually active?  No  Do you have more than one partner?  No  Hearing Difficulties: No Do you often ask people to speak up or repeat themselves? No Do you experience ringing or noises in your ears? No Do you have difficulty understanding soft or whispered voices? No   Do you feel that you have a problem with memory? No  Do you often misplace items? No  Do you feel safe at home?  Yes  Cognitive Testing  Alert? Yes  Normal Appearance?Yes  Oriented to person? Yes  Place? Yes   Time? Yes  Recall of three objects?  Yes  Can perform simple calculations? Yes  Displays appropriate judgment?Yes  Can read the correct time from a watch face?Yes   Advanced Directives have been discussed with the patient? Yes  List the Names of Other Physician/Practitioners you currently use: 1.    Indicate any recent Medical Services you may have received from other than [facility] providers in the past year (date may be approximate).  Immunization History  Administered Date(s) Administered  . Influenza Split 03/15/2012  . Influenza Whole 02/27/2009  . Influenza, High Dose Seasonal PF 03/19/2013  . Influenza,inj,Quad PF,36+ Mos 03/13/2014  . Pneumococcal Conjugate-13 05/16/2013  . Pneumococcal-Unspecified 05/16/2013  . Tdap 10/28/2010  . Zoster 05/12/2014    Screening Tests Health Maintenance   Topic Date Due  . COLONOSCOPY  05/30/2010  . PNA vac Low Risk Adult (2 of 2 - PCV13) 05/16/2014  . MAMMOGRAM  10/29/2014  . HEMOGLOBIN A1C  12/01/2014  . INFLUENZA VACCINE  12/29/2014  . FOOT EXAM  06/03/2015  . URINE MICROALBUMIN  06/03/2015  . OPHTHALMOLOGY EXAM  07/01/2015  . TETANUS/TDAP  10/27/2020  . DEXA SCAN  Completed  . ZOSTAVAX  Completed    All answers were reviewed with the patient and necessary referrals were made:  Garnet Koyanagi, DO   09/29/2014   History reviewed:  She  has a past medical history of Hyperlipidemia; Hypertension (2004); DVT (deep venous thrombosis) (2006); PTE (pulmonary thromboembolism) (2006); Basal cell cancer; Granulomatous lung disease (2002); Paralysis (2006); Asthma; Diabetes mellitus (2010); GERD (gastroesophageal reflux disease); Arthritis; and Complication of anesthesia. She  does not have any pertinent problems on file. She  has past surgical history that includes Bunionectomy; Septoplasty; Tubal ligation; Colonoscopy (1992 & 2002); Rotator cuff repair (2009); epidural steroids (2006); Cervical fusion (2006); Tonsillectomy (74 years old); and Total hip arthroplasty (06/01/2012). Her family history includes COPD in her father; Cancer in her mother; Diabetes in her sister; Heart disease in her sister; Stroke in her maternal grandmother; Transient ischemic attack in her paternal aunt. She  reports that she has never smoked. She has never used smokeless tobacco. She reports that she drinks alcohol. She reports that she does not use illicit drugs. She has a current medication list which includes the following prescription(s): albuterol, amoxicillin-clavulanate, aspirin, calcium gluconate, cholecalciferol, cyclobenzaprine, docusate sodium, epinephrine, fluticasone-salmeterol, gabapentin, glucose blood, meloxicam, metformin, multivitamin, olopatadine, olopatadine hcl, omeprazole, onetouch delica lancets fine, and polyethylene glycol. Current Outpatient  Prescriptions on File Prior to Visit  Medication Sig Dispense Refill  . albuterol (PROAIR HFA) 108 (90 BASE) MCG/ACT inhaler Inhale 2 puffs into the lungs every 6 (six) hours as needed for wheezing or shortness of breath. 3 Inhaler 3  . amoxicillin-clavulanate (AUGMENTIN) 875-125 MG per tablet Take 1 tablet by mouth 2 (two) times daily. 14 tablet 1  . aspirin 325 MG EC tablet Take 325 mg by mouth daily.    [name]  calcium gluconate 500 MG tablet Take 600 mg by mouth 2 (two) times daily.     . cholecalciferol (VITAMIN D) 1000 UNITS tablet Take 1,000 Units by mouth daily. 2 pills daily    . cyclobenzaprine (FLEXERIL) 10 MG tablet Take 10 mg by mouth as needed. For Hip concerns    . docusate sodium (COLACE) 100 MG capsule Take 100-200 mg by mouth every morning.    [name] EPINEPHrine 0.3 mg/0.3 mL IJ SOAJ injection inkject into thigh for severe allergic reaction 1 Device prn  . Fluticasone-Salmeterol (ADVAIR DISKUS) 250-50 MCG/DOSE AEPB Inhale 1 puff into the lungs 2 (two) times daily. Rinse mouth 180 each 3  . glucose blood (ONETOUCH VERIO) test strip Check blood sugar twice daily 200 each 3  . meloxicam (MOBIC) 15 MG tablet Take 1 tablet by mouth daily.  0  . metFORMIN (GLUCOPHAGE-XR) 500 MG 24 hr tablet take 1 tablet by mouth once daily WITH BREAKFAST 90 tablet 3  . Multiple Vitamins-Minerals (MULTIVITAMIN) LIQD Take by mouth. Vemma    . Olopatadine HCl (PATADAY OP) Place 1 drop into both eyes daily as needed. For allergies    . omeprazole (PRILOSEC) 20 MG capsule Take 20 mg by mouth as needed. For acid reflux    . ONETOUCH DELICA LANCETS FINE MISC Check blood sugar twice daily 200 each 3  . polyethylene glycol (MIRALAX / GLYCOLAX) packet Take 17 g by mouth daily.     No current facility-administered medications on file prior to visit.   She is allergic to statins.  Review of Systems  Review of Systems  Constitutional: Negative for activity change, appetite change and fatigue.  HENT: Negative for  hearing loss, congestion, tinnitus and ear discharge.   Eyes: Negative for visual disturbance (see optho q1y -- vision corrected to 20/20 with glasses).  Respiratory: Negative for cough, chest tightness and shortness of breath.   Cardiovascular: Negative for chest pain, palpitations and leg swelling.  Gastrointestinal: Negative for abdominal pain, diarrhea, constipation and abdominal distention.  Genitourinary: Negative for urgency, frequency, decreased urine volume and difficulty urinating.  Musculoskeletal: Negative for back pain, arthralgias and gait problem.  Skin: Negative for color change, pallor and rash.  Neurological: Negative for dizziness, light-headedness, numbness and headaches.  Hematological: Negative for adenopathy. Does not bruise/bleed easily.  Psychiatric/Behavioral: Negative for suicidal ideas, confusion, sleep disturbance, self-injury, dysphoric mood, decreased concentration and agitation.  Pt is able to read and write and can do all ADLs No risk for falling No abuse/ violence in home      Objective:     Vision by Snellen chart: right [id number]  Body mass index is 23.68 kg/(m^2). BP 122/70 mmHg  Pulse 74  Temp(Src) 98.3 F (36.8 C) (Oral)  Resp 18  Ht 5\' 4"  (1.626 m)  Wt 138 lb (62.596 kg)  BMI 23.68 kg/m2  SpO2 98%  BP 122/70 mmHg  Pulse 74  Temp(Src) 98.3 F (36.8 C) (Oral)  Resp 18  Ht 5\' 4"  (1.626 m)  Wt 138 lb (62.596 kg)  BMI 23.68 kg/m2  SpO2 98% General appearance: alert, cooperative, appears stated age and no distress Head: Normocephalic, without obvious abnormality, atraumatic Eyes: conjunctivae/corneas clear. PERRL, EOM's intact. Fundi benign. Ears: normal TM's and external ear canals both ears Nose: Nares normal. Septum midline. Mucosa normal. No drainage or sinus tenderness. Throat: lips, mucosa, and tongue normal; teeth and gums normal Neck: no adenopathy, no carotid bruit, no JVD, supple, symmetrical, trachea midline and thyroid not  enlarged, symmetric, no  tenderness/mass/nodules Back: symmetric, no curvature. ROM normal. No CVA tenderness. Lungs: clear to auscultation bilaterally Breasts: normal appearance, no masses or tenderness Heart: regular rate and rhythm, S1, S2 normal, no murmur, click, rub or gallop Abdomen: soft, non-tender; bowel sounds normal; no masses,  no organomegaly Pelvic: not indicated; post-menopausal, no abnormal Pap smears in past Extremities: extremities normal, atraumatic, no cyanosis or edema Pulses: 2+ and symmetric Skin: Skin color, texture, turgor normal. No rashes or lesions Lymph nodes: Cervical, supraclavicular, and axillary nodes normal. Neurologic: Alert and oriented X 3, normal strength and tone. Normal symmetric reflexes. Normal coordination and gait Psych-- no depression, no anxiety      Assessment:     cpe      Plan:     During the course of the visit the patient was educated and counseled about appropriate screening and preventive services including:    Pneumococcal vaccine   Influenza vaccine  Hepatitis B vaccine  Td vaccine  Screening mammography  Screening Pap smear and pelvic exam   Bone densitometry screening  Colorectal cancer screening  Diabetes screening  Glaucoma screening  Advanced directives: has an advanced directive - a copy HAS NOT been provided.  Diet review for nutrition referral? Yes ____  Not Indicated _x___   Patient Instructions (the written plan) was given to the patient.  Medicare Attestation I have personally reviewed: The patient's medical and social history Their use of alcohol, tobacco or illicit drugs Their current medications and supplements The patient's functional ability including ADLs,fall risks, home safety risks, cognitive, and hearing and visual impairment Diet and physical activities Evidence for depression or mood disorders  The patient's weight, height, BMI, and visual acuity have been recorded in the  chart.  I have made referrals, counseling, and provided education to the patient based on review of the above and I have provided the patient with a written personalized care plan for preventive services.    1. Need for prophylactic vaccination against Streptococcus pneumoniae (pneumococcus)    2. Diabetes mellitus type II, controlled  Check labs- metFORMIN (GLUCOPHAGE-XR) 500 MG 24 hr tablet; take 1 tablet by mouth once daily WITH BREAKFAST  Dispense: 90 tablet; Refill: 3 - glucose blood (ONETOUCH VERIO) test strip; Check blood sugar twice daily  Dispense: 200 each; Refill: 3  3. Routine history and physical examination of adult     Garnet Koyanagi, DO   09/29/2014

## 2014-09-29 NOTE — Progress Notes (Signed)
Pre visit review using our clinic review tool, if applicable. No additional management support is needed unless otherwise documented below in the visit note. 

## 2014-09-29 NOTE — Assessment & Plan Note (Signed)
Dx with arthritis in past

## 2014-09-30 ENCOUNTER — Encounter: Payer: Self-pay | Admitting: Family Medicine

## 2014-09-30 DIAGNOSIS — M9901 Segmental and somatic dysfunction of cervical region: Secondary | ICD-10-CM | POA: Diagnosis not present

## 2014-09-30 DIAGNOSIS — M9902 Segmental and somatic dysfunction of thoracic region: Secondary | ICD-10-CM | POA: Diagnosis not present

## 2014-09-30 DIAGNOSIS — M9903 Segmental and somatic dysfunction of lumbar region: Secondary | ICD-10-CM | POA: Diagnosis not present

## 2014-09-30 DIAGNOSIS — M4726 Other spondylosis with radiculopathy, lumbar region: Secondary | ICD-10-CM | POA: Diagnosis not present

## 2014-10-01 DIAGNOSIS — M791 Myalgia: Secondary | ICD-10-CM | POA: Diagnosis not present

## 2014-10-01 DIAGNOSIS — M461 Sacroiliitis, not elsewhere classified: Secondary | ICD-10-CM | POA: Diagnosis not present

## 2014-10-01 DIAGNOSIS — R202 Paresthesia of skin: Secondary | ICD-10-CM | POA: Diagnosis not present

## 2014-10-06 DIAGNOSIS — M9901 Segmental and somatic dysfunction of cervical region: Secondary | ICD-10-CM | POA: Diagnosis not present

## 2014-10-06 DIAGNOSIS — M9902 Segmental and somatic dysfunction of thoracic region: Secondary | ICD-10-CM | POA: Diagnosis not present

## 2014-10-06 DIAGNOSIS — M9903 Segmental and somatic dysfunction of lumbar region: Secondary | ICD-10-CM | POA: Diagnosis not present

## 2014-10-06 DIAGNOSIS — M4726 Other spondylosis with radiculopathy, lumbar region: Secondary | ICD-10-CM | POA: Diagnosis not present

## 2014-10-07 ENCOUNTER — Telehealth: Payer: Self-pay | Admitting: *Deleted

## 2014-10-07 NOTE — Telephone Encounter (Signed)
Notified pharmacist.

## 2014-10-07 NOTE — Telephone Encounter (Signed)
1 po tid prn

## 2014-10-07 NOTE — Telephone Encounter (Signed)
[facility] pharmacist needs clarification on Flexeril- Rx states   Sig - Route: Take 1 tablet (10 mg total) by mouth as needed. For Hip concerns - Oral  Just needs clarification on frequency.  Please advise.

## 2014-10-08 DIAGNOSIS — M9902 Segmental and somatic dysfunction of thoracic region: Secondary | ICD-10-CM | POA: Diagnosis not present

## 2014-10-08 DIAGNOSIS — M9903 Segmental and somatic dysfunction of lumbar region: Secondary | ICD-10-CM | POA: Diagnosis not present

## 2014-10-08 DIAGNOSIS — M4726 Other spondylosis with radiculopathy, lumbar region: Secondary | ICD-10-CM | POA: Diagnosis not present

## 2014-10-08 DIAGNOSIS — M9901 Segmental and somatic dysfunction of cervical region: Secondary | ICD-10-CM | POA: Diagnosis not present

## 2014-10-09 DIAGNOSIS — Z1211 Encounter for screening for malignant neoplasm of colon: Secondary | ICD-10-CM | POA: Diagnosis not present

## 2014-10-09 DIAGNOSIS — K5900 Constipation, unspecified: Secondary | ICD-10-CM | POA: Diagnosis not present

## 2014-10-13 ENCOUNTER — Other Ambulatory Visit: Payer: Self-pay | Admitting: Gastroenterology

## 2014-10-13 DIAGNOSIS — M9902 Segmental and somatic dysfunction of thoracic region: Secondary | ICD-10-CM | POA: Diagnosis not present

## 2014-10-13 DIAGNOSIS — M4726 Other spondylosis with radiculopathy, lumbar region: Secondary | ICD-10-CM | POA: Diagnosis not present

## 2014-10-13 DIAGNOSIS — M9901 Segmental and somatic dysfunction of cervical region: Secondary | ICD-10-CM | POA: Diagnosis not present

## 2014-10-13 DIAGNOSIS — M9903 Segmental and somatic dysfunction of lumbar region: Secondary | ICD-10-CM | POA: Diagnosis not present

## 2014-10-29 DIAGNOSIS — M4120 Other idiopathic scoliosis, site unspecified: Secondary | ICD-10-CM | POA: Diagnosis not present

## 2014-10-29 DIAGNOSIS — R030 Elevated blood-pressure reading, without diagnosis of hypertension: Secondary | ICD-10-CM | POA: Diagnosis not present

## 2014-10-29 DIAGNOSIS — M4317 Spondylolisthesis, lumbosacral region: Secondary | ICD-10-CM | POA: Diagnosis not present

## 2014-10-29 DIAGNOSIS — M5416 Radiculopathy, lumbar region: Secondary | ICD-10-CM | POA: Diagnosis not present

## 2014-10-29 DIAGNOSIS — M4806 Spinal stenosis, lumbar region: Secondary | ICD-10-CM | POA: Diagnosis not present

## 2014-11-18 DIAGNOSIS — M5416 Radiculopathy, lumbar region: Secondary | ICD-10-CM | POA: Diagnosis not present

## 2014-11-19 ENCOUNTER — Ambulatory Visit: Payer: Medicare HMO | Admitting: Internal Medicine

## 2014-12-02 ENCOUNTER — Ambulatory Visit: Payer: Medicare Other | Admitting: Family Medicine

## 2014-12-16 ENCOUNTER — Ambulatory Visit (HOSPITAL_COMMUNITY): Admission: RE | Admit: 2014-12-16 | Payer: Medicare Other | Source: Ambulatory Visit | Admitting: Gastroenterology

## 2014-12-16 ENCOUNTER — Encounter (HOSPITAL_COMMUNITY): Admission: RE | Payer: Self-pay | Source: Ambulatory Visit

## 2014-12-16 SURGERY — COLONOSCOPY WITH PROPOFOL
Anesthesia: Monitor Anesthesia Care

## 2014-12-31 DIAGNOSIS — M4806 Spinal stenosis, lumbar region: Secondary | ICD-10-CM | POA: Diagnosis not present

## 2014-12-31 DIAGNOSIS — M5416 Radiculopathy, lumbar region: Secondary | ICD-10-CM | POA: Diagnosis not present

## 2014-12-31 DIAGNOSIS — M4120 Other idiopathic scoliosis, site unspecified: Secondary | ICD-10-CM | POA: Diagnosis not present

## 2014-12-31 DIAGNOSIS — M4317 Spondylolisthesis, lumbosacral region: Secondary | ICD-10-CM | POA: Diagnosis not present

## 2015-01-21 ENCOUNTER — Encounter: Payer: Self-pay | Admitting: Internal Medicine

## 2015-02-13 ENCOUNTER — Telehealth: Payer: Self-pay | Admitting: Family Medicine

## 2015-02-13 NOTE — Telephone Encounter (Signed)
Caller name: Anastyn Ayars  Relationship to patient: Self   Can be reached: [phone]  Pharmacy:  Reason for call: pt is Dr. Ivy Lynn pt but she would like to know if there are some suggestions on what she could do for ankle pain to get her through the weekend. Pt has scheduled an appt w/ Dr Etter Sjogren Monday at [facility].

## 2015-02-13 NOTE — Telephone Encounter (Signed)
Patient reports pain in her ankle. She states she has experienced this pain before and typically putting a band on the area would help. This has been 4-5 days and still hurting. Patient reports no swelling or bruising. Recommended to keep non-weight bearing as much as possible. Take tylenol for pain, reports no intolerance to med. If anything changes such as increased, bruising, or swelling to go to the Urgent Care for evaluation. Otherwise, we will see her Monday morning. Patient agrees with plan.

## 2015-02-16 ENCOUNTER — Encounter: Payer: Self-pay | Admitting: Family Medicine

## 2015-02-16 ENCOUNTER — Ambulatory Visit (INDEPENDENT_AMBULATORY_CARE_PROVIDER_SITE_OTHER): Payer: Medicare Other | Admitting: Family Medicine

## 2015-02-16 VITALS — BP 116/74 | HR 64 | Temp 98.2°F | Ht 64.0 in | Wt 135.8 lb

## 2015-02-16 DIAGNOSIS — F430 Acute stress reaction: Secondary | ICD-10-CM

## 2015-02-16 DIAGNOSIS — M25572 Pain in left ankle and joints of left foot: Secondary | ICD-10-CM

## 2015-02-16 DIAGNOSIS — Z23 Encounter for immunization: Secondary | ICD-10-CM | POA: Diagnosis not present

## 2015-02-16 MED ORDER — ALPRAZOLAM 0.25 MG PO TABS: 0.2500 mg | ORAL_TABLET | Freq: Two times a day (BID) | ORAL | Status: DC | PRN

## 2015-02-16 NOTE — Progress Notes (Signed)
Pre visit review using our clinic review tool, if applicable. No additional management support is needed unless otherwise documented below in the visit note. 

## 2015-02-16 NOTE — Progress Notes (Signed)
Patient ID: [name], female    DOB: 19-Jun-1940  Age: 74 y.o. MRN: [id number]    Subjective:  Subjective HPI [name] presents for c/o anxiety and not sleeping well since her fiance passed away in Jan 24, 2023.  He had been sick for a while and was on 8 L O2.   She is also c/o L ankle-- no known injury.  She has been moving.  Pain for a week.  This occurs frequently but is worse this time.   No swelling.    Review of Systems  Constitutional: Negative for diaphoresis, appetite change, fatigue and unexpected weight change.  Eyes: Negative for pain, redness and visual disturbance.  Respiratory: Negative for cough, chest tightness, shortness of breath and wheezing.   Cardiovascular: Negative for chest pain, palpitations and leg swelling.  Endocrine: Negative for cold intolerance, heat intolerance, polydipsia, polyphagia and polyuria.  Genitourinary: Negative for dysuria, frequency and difficulty urinating.  Musculoskeletal: Positive for arthralgias. Negative for myalgias, joint swelling and gait problem.  Neurological: Negative for dizziness, light-headedness, numbness and headaches.  Psychiatric/Behavioral: Positive for sleep disturbance. Negative for self-injury and dysphoric mood. The patient is nervous/anxious.     History Past Medical History  Diagnosis Date  . Hyperlipidemia   . Hypertension 2004    Hypertensive response on Stress Test  . DVT (deep venous thrombosis) 2006    post immobilization post cns surgery  . PTE (pulmonary thromboembolism) 2006  . Basal cell cancer     LUE; Porokeratosis also  . Granulomatous lung disease 2002    incidental Xray finding  . Paralysis 2006    post cervical fusion with spinal sac tear  with hematoma   . Asthma     adult onset  . Diabetes mellitus 2010    A1c 6.7%  . GERD (gastroesophageal reflux disease)     very mild  . Arthritis   . Complication of anesthesia     small trachea    She has past surgical history that includes  Bunionectomy; Septoplasty; Tubal ligation; Colonoscopy (1992 & 2002); Rotator cuff repair (2009); epidural steroids (2006); Cervical fusion (2006); Tonsillectomy (74 years old); and Total hip arthroplasty (06/01/2012).   Her family history includes COPD in her father; Cancer in her mother; Diabetes in her sister; Heart disease in her sister; Stroke in her maternal grandmother; Transient ischemic attack in her paternal aunt.She reports that she has never smoked. She has never used smokeless tobacco. She reports that she drinks alcohol. She reports that she does not use illicit drugs.  Current Outpatient Prescriptions on File Prior to Visit  Medication Sig Dispense Refill  . albuterol (PROAIR HFA) 108 (90 BASE) MCG/ACT inhaler Inhale 2 puffs into the lungs every 6 (six) hours as needed for wheezing or shortness of breath. 3 Inhaler 3  . aspirin 325 MG EC tablet Take 325 mg by mouth daily.    . calcium gluconate 500 MG tablet Take 600 mg by mouth 2 (two) times daily.     . cholecalciferol (VITAMIN D) 1000 UNITS tablet Take 1,000 Units by mouth daily. 2 pills daily    . cyclobenzaprine (FLEXERIL) 10 MG tablet Take 1 tablet (10 mg total) by mouth as needed. For Hip concerns 30 tablet 1  . docusate sodium (COLACE) 100 MG capsule Take 100-200 mg by mouth every morning.    [name] EPINEPHrine 0.3 mg/0.3 mL IJ SOAJ injection inkject into thigh for severe allergic reaction 1 Device prn  . Fluticasone-Salmeterol (ADVAIR DISKUS) 250-50 MCG/DOSE AEPB  Inhale 1 puff into the lungs 2 (two) times daily. Rinse mouth 180 each 3  . gabapentin (NEURONTIN) 300 MG capsule   1  . glucose blood (ONETOUCH VERIO) test strip Check blood sugar twice daily 200 each 3  . meloxicam (MOBIC) 15 MG tablet Take 1 tablet by mouth daily.  0  . metFORMIN (GLUCOPHAGE-XR) 500 MG 24 hr tablet take 1 tablet by mouth once daily WITH BREAKFAST 90 tablet 3  . Multiple Vitamins-Minerals (MULTIVITAMIN) LIQD Take by mouth. Vemma    . NONFORMULARY OR  COMPOUNDED ITEM Allergy Vaccine 1:10 Given at Home    . olopatadine (PATANOL) 0.1 % ophthalmic solution   12  . Olopatadine HCl (PATADAY OP) Place 1 drop into both eyes daily as needed. For allergies    . omeprazole (PRILOSEC) 20 MG capsule Take 20 mg by mouth as needed. For acid reflux    . ONETOUCH DELICA LANCETS FINE MISC Check blood sugar twice daily 200 each 3  . polyethylene glycol (MIRALAX / GLYCOLAX) packet Take 17 g by mouth daily.     No current facility-administered medications on file prior to visit.     Objective:  Objective Physical Exam  Constitutional: She is oriented to person, place, and time. She appears well-developed and well-nourished.  HENT:  Head: Normocephalic and atraumatic.  Eyes: Conjunctivae and EOM are normal.  Neck: Normal range of motion. Neck supple. No JVD present. Carotid bruit is not present. No thyromegaly present.  Cardiovascular: Normal rate, regular rhythm and normal heart sounds.   No murmur heard. Pulmonary/Chest: Effort normal and breath sounds normal. No respiratory distress. She has no wheezes. She has no rales. She exhibits no tenderness.  Musculoskeletal: She exhibits no edema or tenderness.       Left ankle: She exhibits normal range of motion, no swelling and no deformity. No tenderness. Achilles tendon exhibits no pain and no defect.  Neurological: She is alert and oriented to person, place, and time.  Psychiatric: Her behavior is normal. Judgment normal. Her mood appears anxious. Her affect is not angry, not blunt, not labile and not inappropriate. Thought content is paranoid. Thought content is not delusional. Cognition and memory are not impaired. She does not exhibit a depressed mood. She expresses no homicidal and no suicidal ideation. She expresses no suicidal plans and no homicidal plans. She exhibits normal recent memory and normal remote memory.  Pt anxious and not sleep since her fiance passed away las month He had been sick with  copd on 8 L O2    BP 116/74 mmHg  Pulse 64  Temp(Src) 98.2 F (36.8 C) (Oral)  Ht 5\' 4"  (1.626 m)  Wt 135 lb 12.8 oz (61.598 kg)  BMI 23.30 kg/m2  SpO2 98% Wt Readings from Last 3 Encounters:  02/16/15 135 lb 12.8 oz (61.598 kg)  09/29/14 138 lb (62.596 kg)  08/10/14 141 lb (63.957 kg)     Lab Results  Component Value Date   WBC 7.4 06/02/2014   HGB 11.8* 06/02/2014   HCT 36.9 06/02/2014   PLT 269.0 06/02/2014   GLUCOSE 111* 06/18/2014   CHOL 223* 06/02/2014   TRIG 287.0* 06/02/2014   HDL 41.20 06/02/2014   LDLDIRECT 125.7 06/02/2014   LDLCALC 108* 06/16/2008   ALT 15 06/18/2014   AST 17 06/18/2014   NA 137 06/18/2014   K 4.2 06/18/2014   CL 104 06/18/2014   CREATININE 0.85 06/18/2014   BUN 20 06/18/2014   CO2 29 06/18/2014   TSH  2.60 04/05/2013   INR 0.96 05/28/2012   HGBA1C 6.8* 06/02/2014   MICROALBUR 0.4 06/02/2014    Mr Lumbar Spine Wo Contrast  08/10/2014   CLINICAL DATA:  Initial evaluation for low back pain with tingling and right leg pain, numbness in bilateral feet.  EXAM: MRI LUMBAR SPINE WITHOUT CONTRAST  TECHNIQUE: Multiplanar, multisequence MR imaging of the lumbar spine was performed. No intravenous contrast was administered.  COMPARISON:  Prior study from 12/08/2006.  FINDINGS: For the purposes of this dictation, the lowest well-formed intervertebral disc spaces presumed to be the L5-S1 level, and there presumed to be 5 lumbar type vertebral bodies.  Mild dextroscoliosis of the lumbar spine with apex at L2-3 again noted. 2 mm anterolisthesis of L5 on S1 present. Otherwise, there is preservation of the normal lumbar lordosis Vertebral body heights are well preserved. Signal intensity within the vertebral body bone marrow is normal. T2 hyperintense lesion partially visualized in the right iliac wing noted, likely degenerative in nature. No other focal osseous lesion.  Conus medullaris terminates normally at the L1-2 level. Signal intensity within the  visualized cord is unremarkable. Nerve roots of the cauda equina within normal limits.  Paraspinous soft tissues are unremarkable.  T11-12:  Negative.  T12-L1:  Negative.  L1-2: Minimal degenerative disc bulge with disc desiccation. No focal disc protrusion. Mild bilateral facet hypertrophy. No significant stenosis.  L2-3: Mild disc bulge with disc desiccation. No focal disc protrusion. Bilateral facet hypertrophy with mild ligamentum flavum thickening. No canal or foraminal stenosis.  L3-4: Mild disc bulge with intervertebral disc space narrowing and disc desiccation. No focal disc herniation. Small posterior annular fissure noted. There is moderate bilateral facet hypertrophy with ligamentum flavum thickening, slightly worse on the left. The thickened ligamentum flavum mildly encroaches upon the left lateral recess. The transiting left L4 nerve root is displaced anteriorly without frank impingement. There is mild canal and left neural foraminal narrowing. Right neural foramen widely patent.  L4-5: Mild disc bulge with disc desiccation and intervertebral disc space narrowing. Tiny shallow disc protrusion with associated annular fissure. Bilateral facet degeneration present. Small synovial cyst seen on prior study at the left L4-5 facet no longer visualized. There is mild canal narrowing. Mild to moderate right foraminal stenosis present.  L5-S1: Disc bulge with disc desiccation and intervertebral disc space narrowing. 2 mm anterolisthesis noted. Probable small posterior annular fissure. The right L5-S1 facet is malformed, stable. There is left-sided facet degeneration. No significant canal or left neural foraminal stenosis. Mild right foraminal narrowing without impingement.  IMPRESSION: 1. Mild dextroscoliosis of the lumbar spine with apex at L2-3. 2. Shallow disc protrusion with associated annular fissure at L4-5 which contacts the thecal sac without neural impingement. Mild to moderate right foraminal narrowing  at this level due to disc bulge and bilateral facet arthrosis. 3. Bilateral facet arthrosis at L3-4, greater on the left. The transiting left L4 nerve root is displaced anteriorly in the left lateral recess by the hypertrophied left L3-4 facet without frank neural impingement. 4. 2 mm anterolisthesis of L5 on S1 with associated mild disc bulge and annular fissure. Right L5-S1 facet is improperly developed with left-sided facet arthrosis.   Electronically Signed   By: Jeannine Boga M.D.   On: 08/10/2014 20:39     Assessment & Plan:  Plan I have discontinued Ms. Gianfrancesco amoxicillin-clavulanate. I am also having her start on ALPRAZolam. Additionally, I am having her maintain her omeprazole, Olopatadine HCl (PATADAY OP), MULTIVITAMIN, calcium gluconate, cholecalciferol, polyethylene glycol,  docusate sodium, aspirin, meloxicam, albuterol, EPINEPHrine, Fluticasone-Salmeterol, gabapentin, olopatadine, ONETOUCH DELICA LANCETS FINE, metFORMIN, glucose blood, cyclobenzaprine, NONFORMULARY OR COMPOUNDED ITEM, and traMADol.  Meds ordered this encounter  Medications  . traMADol (ULTRAM) 50 MG tablet    Sig: Take 1 tablet by mouth every 6 (six) hours as needed.    Refill:  0  . ALPRAZolam (XANAX) 0.25 MG tablet    Sig: Take 1 tablet (0.25 mg total) by mouth 2 (two) times daily as needed for anxiety.    Dispense:  30 tablet    Refill:  0    Problem List Items Addressed This Visit    None    Visit Diagnoses    Need for prophylactic vaccination and inoculation against influenza    -  Primary    Relevant Orders    Flu Vaccine QUAD 36+ mos PF IM (Fluarix & Fluzone Quad PF) (Completed)    Reaction, stress, acute        Relevant Medications    ALPRAZolam (XANAX) 0.25 MG tablet    Left ankle pain        Relevant Orders    Ambulatory referral to Physical Therapy       Follow-up: Return if symptoms worsen or fail to improve.  Garnet Koyanagi, DO

## 2015-02-16 NOTE — Patient Instructions (Signed)
Adjustment Disorder °Most changes in life can cause stress. Getting used to changes may take a few months or longer. If feelings of stress, hopelessness, or worry continue, you may have an adjustment disorder. This stress-related mental health problem may affect your feelings, thinking and how you act. It occurs in both sexes and happens at any age. °SYMPTOMS  °Some of the following problems may be seen and vary from person to person: °· Sadness or depression. °· Loss of enjoyment. °· Thoughts of suicide. °· Fighting. °· Avoiding family and friends. °· Poor school performance. °· Hopelessness, sense of loss. °· Trouble sleeping. °· Vandalism. °· Worry, weight loss or gain. °· Crying spells. °· Anxiety °· Reckless driving. °· Skipping school. °· Poor work performance. °· Nervousness. °· Ignoring bills. °· Poor attitude. °DIAGNOSIS  °Your caregiver will ask what has happened in your life and do a physical exam. They will make a diagnosis of an adjustment disorder when they are sure another problem or medical illness causing your feelings does not exist. °TREATMENT  °When problems caused by stress interfere with you daily life or last longer than a few months, you may need counseling for an adjustment disorder. Early treatment may diminish problems and help you to better cope with the stressful events in your life. Sometimes medication is necessary. Individual counseling and or support groups can be very helpful. °PROGNOSIS  °Adjustment disorders usually last less than 3 to 6 months. The condition may persist if there is long lasting stress. This could include health problems, relationship problems, or job difficulties where you can not easily escape from what is causing the problem. °PREVENTION  °Even the most mentally healthy, highly functioning people can suffer from an adjustment disorder given a significant blow from a life-changing event. There is no way to prevent pain and loss. Most people need help from time  to time. You are not alone. °SEEK MEDICAL CARE IF:  °Your feelings or symptoms listed above do not improve or worsen. °Document Released: 01/18/2006 Document Revised: 08/08/2011 Document Reviewed: 04/11/2007 °[employer]® Patient Information ©2015 [employer]. This information is not intended to replace advice given to you by your health care provider. Make sure you discuss any questions you have with your health care provider. ° °

## 2015-04-01 DIAGNOSIS — M4317 Spondylolisthesis, lumbosacral region: Secondary | ICD-10-CM | POA: Diagnosis not present

## 2015-04-01 DIAGNOSIS — M5416 Radiculopathy, lumbar region: Secondary | ICD-10-CM | POA: Diagnosis not present

## 2015-04-01 DIAGNOSIS — M4120 Other idiopathic scoliosis, site unspecified: Secondary | ICD-10-CM | POA: Diagnosis not present

## 2015-04-01 DIAGNOSIS — R030 Elevated blood-pressure reading, without diagnosis of hypertension: Secondary | ICD-10-CM | POA: Diagnosis not present

## 2015-04-01 DIAGNOSIS — M4806 Spinal stenosis, lumbar region: Secondary | ICD-10-CM | POA: Diagnosis not present

## 2015-04-02 ENCOUNTER — Ambulatory Visit: Payer: Medicare Other | Admitting: Family Medicine

## 2015-04-06 ENCOUNTER — Encounter: Payer: Self-pay | Admitting: Internal Medicine

## 2015-04-06 DIAGNOSIS — M4726 Other spondylosis with radiculopathy, lumbar region: Secondary | ICD-10-CM | POA: Diagnosis not present

## 2015-04-06 DIAGNOSIS — M9903 Segmental and somatic dysfunction of lumbar region: Secondary | ICD-10-CM | POA: Diagnosis not present

## 2015-04-08 ENCOUNTER — Telehealth: Payer: Self-pay | Admitting: Internal Medicine

## 2015-04-08 NOTE — Telephone Encounter (Signed)
Consider her dc'd

## 2015-04-08 NOTE — Telephone Encounter (Signed)
Pt. Last saw you 05/15/14: Dropped to inj. q 2 wks.. Last vac. Order 02/28/14. Should we consider her d/c'd? Please advise.

## 2015-04-09 NOTE — Telephone Encounter (Signed)
Noted, Nothing further needed. 

## 2015-04-13 DIAGNOSIS — M4726 Other spondylosis with radiculopathy, lumbar region: Secondary | ICD-10-CM | POA: Diagnosis not present

## 2015-04-13 DIAGNOSIS — M9903 Segmental and somatic dysfunction of lumbar region: Secondary | ICD-10-CM | POA: Diagnosis not present

## 2015-04-14 DIAGNOSIS — M9903 Segmental and somatic dysfunction of lumbar region: Secondary | ICD-10-CM | POA: Diagnosis not present

## 2015-04-14 DIAGNOSIS — M4726 Other spondylosis with radiculopathy, lumbar region: Secondary | ICD-10-CM | POA: Diagnosis not present

## 2015-04-15 DIAGNOSIS — M4726 Other spondylosis with radiculopathy, lumbar region: Secondary | ICD-10-CM | POA: Diagnosis not present

## 2015-04-15 DIAGNOSIS — M9903 Segmental and somatic dysfunction of lumbar region: Secondary | ICD-10-CM | POA: Diagnosis not present

## 2015-04-17 DIAGNOSIS — M5416 Radiculopathy, lumbar region: Secondary | ICD-10-CM | POA: Diagnosis not present

## 2015-04-21 DIAGNOSIS — M4726 Other spondylosis with radiculopathy, lumbar region: Secondary | ICD-10-CM | POA: Diagnosis not present

## 2015-04-21 DIAGNOSIS — M9903 Segmental and somatic dysfunction of lumbar region: Secondary | ICD-10-CM | POA: Diagnosis not present

## 2015-05-05 ENCOUNTER — Ambulatory Visit (INDEPENDENT_AMBULATORY_CARE_PROVIDER_SITE_OTHER): Payer: Medicare Other | Admitting: Internal Medicine

## 2015-05-05 ENCOUNTER — Encounter: Payer: Self-pay | Admitting: Internal Medicine

## 2015-05-05 VITALS — BP 120/74 | HR 69 | Ht 63.0 in | Wt 135.6 lb

## 2015-05-05 DIAGNOSIS — J45998 Other asthma: Secondary | ICD-10-CM

## 2015-05-05 DIAGNOSIS — J309 Allergic rhinitis, unspecified: Secondary | ICD-10-CM | POA: Diagnosis not present

## 2015-05-05 DIAGNOSIS — J302 Other seasonal allergic rhinitis: Secondary | ICD-10-CM

## 2015-05-05 DIAGNOSIS — M4726 Other spondylosis with radiculopathy, lumbar region: Secondary | ICD-10-CM | POA: Diagnosis not present

## 2015-05-05 DIAGNOSIS — J3089 Other allergic rhinitis: Principal | ICD-10-CM

## 2015-05-05 DIAGNOSIS — M9903 Segmental and somatic dysfunction of lumbar region: Secondary | ICD-10-CM | POA: Diagnosis not present

## 2015-05-05 MED ORDER — ALBUTEROL SULFATE HFA 108 (90 BASE) MCG/ACT IN AERS: 2.0000 | INHALATION_SPRAY | Freq: Four times a day (QID) | RESPIRATORY_TRACT | Status: DC | PRN

## 2015-05-05 MED ORDER — FLUTICASONE-SALMETEROL 250-50 MCG/DOSE IN AEPB: 1.0000 | INHALATION_SPRAY | Freq: Two times a day (BID) | RESPIRATORY_TRACT | Status: DC

## 2015-05-05 NOTE — Assessment & Plan Note (Signed)
She has been on allergy vaccine long enough to consider likely this therapy has done what it can do for her. She will use nasal steroid spray and antihistamines as needed and can be reassessed for allergy management in the future if she has increased problems.

## 2015-05-05 NOTE — Progress Notes (Signed)
05/09/11- 89 yoF never smoker  followed for allergic rhinitis, asthma, complicated by history embolic CVA complicating C-spine surgery.LOV-06/15/10 Has had flu vaccine. Says she has done well since last visit. Now working part-time. She continues allergy vaccine and feels that definitely helps.  05/15/12- 71 yoF never smoker  followed for allergic rhinitis, asthma, complicated by history embolic CVA complicating C-spine surgery FOLLOWS FOR: allergy attack last Thursday; dry cough today but no wheezing or SOB at this time Doing well with allergy vaccine 1:10 GO. We discussed risks and benefits, goals and duration of therapy. She is satisfied to continue. Blames "allergy attack" for increased sneezing and watery rhinorrhea, undefined trigger. She continues OTC antihistamine at bedtime. Out of Nasonex. Occasional wheeze. In the fall season uses rescue inhaler 2 or 3 times per week.  05/16/13- 37 yoF never smoker  followed for allergic rhinitis, asthma, complicated by history embolic CVA complicating C-spine surgery Follows for- annual rov.  Pt c/o itchy, watery eyes since the weather turned cold.  Pt has no complaints with her Allergy vaccine 1:10 GO. Feels pressure in left ear without loss of hearing. Transient wheeze resolved with her rescue inhaler. Stays on Advair.  08/15/13- 43 yoF never smoker  followed for allergic rhinitis, asthma, complicated by history embolic CVA complicating C-spine surgery ACUTE VISIT: still on Allergy vaccine 1:10 GO; ? sinus infection-still having sinus pressure, congestion, wheezing, blowing nose-clear to yellow in color; cough as well-lite yellow in color. Started doxycycline. Began wheezing and urgent care gave prednisone ending 3 days ago. Now less cough with scant white sputum but still nasal congestion. Today started intermittent left parasternal pain, currently gone, not exertional. Ipratropium nasal spray too drying. Using Neti pot and over-the-counter  decongestant.  05/15/14- 73 yoF never smoker  followed for allergic rhinitis, asthma, complicated by history embolic CVA complicating C-spine surgery FOLLOWS FOR: Still on vaccine 1:10 GO- 1 shot every 2 weeks and doing well. Needs Refills 90 day supply printed for Advair and Albuterol HFA. Discussed medications. Asthma control has been very good.  05/05/2015-74 year old female never smoker followed for allergic rhinitis, asthma, complicated by history embolic CVA complicating C-spine surgery, DM 2 Allergy Vaccine DC'd FOLLOWS FOR: Pt states she is doing well without her allergy injections; slight "drip" in her nose but not bothersome. She is off allergy vaccine since mid summer and denies significant problems during the fall season. Had a bad chest cold 6 weeks ago without wheezing. Not using any antihistamines or nasal sprays. We reviewed medications for refill and noted they can be provided by her primary physician.  ROV-see HPI Constitutional:   No-   weight loss, night sweats, fevers, chills, fatigue, lassitude. HEENT:   No-  headaches, difficulty swallowing, tooth/dental problems, sore throat,       + Variable sneezing, itching, ear ache, +nasal congestion, post nasal drip,  CV:  chest pain, no-orthopnea, PND, swelling in lower extremities, anasarca, dizziness, palpitations Resp: No-   shortness of breath with exertion or at rest.              No-   productive cough,  No non-productive cough,  No- coughing up of blood.              No-   change in color of mucus.  No- wheezing.   Skin: No-   rash or lesions. GI:  GU:  MS:  No-   joint pain or swelling.  No- decreased range of motion.   Neuro-  nothing unusual Psych:  No- change in mood or affect. No depression or anxiety.  No memory loss.  OBJ General- Alert, Oriented, Affect-appropriate, Distress- none acute Skin- rash-none, lesions- none, excoriation- none Lymphadenopathy- none Head- atraumatic            Eyes- Gross vision  intact, PERRLA, conjunctivae clear secretions            Ears- Hearing, canals-normal            Nose- Clear, no-Septal dev, mucus, polyps, erosion, perforation             Throat- Mallampati II , mucosa clear , drainage- none, tonsils- atrophic,  Neck- flexible , trachea midline, no stridor , thyroid nl, carotid no bruit Chest - symmetrical excursion , unlabored           Heart/CV- RRR , no murmur , no gallop  , no rub, nl s1 s2                           - JVD- none , edema- none, stasis changes- none, varices- none           Lung- clear to P&A, wheeze- none, cough+harsh , dullness-none, rub- none           Chest wall-  Abd-  Br/ Gen/ Rectal- Not done, not indicated Extrem- cyanosis- none, clubbing, none, atrophy- none, strength- nl Neuro- grossly intact to observation

## 2015-05-05 NOTE — Assessment & Plan Note (Addendum)
Mild intermittent uncomplicated well-controlled. Plan-continue present meds with refills. We will be happy to see her again if her primary physician needs our help.

## 2015-05-05 NOTE — Patient Instructions (Addendum)
Refill scripts printed for Advair and the albuterol rescue inhaler  Ok to use an otc nasal spray like Flonase, and an otc antihistamine like claritin as needed

## 2015-05-06 DIAGNOSIS — M25562 Pain in left knee: Secondary | ICD-10-CM | POA: Diagnosis not present

## 2015-05-20 DIAGNOSIS — M9903 Segmental and somatic dysfunction of lumbar region: Secondary | ICD-10-CM | POA: Diagnosis not present

## 2015-05-20 DIAGNOSIS — M4726 Other spondylosis with radiculopathy, lumbar region: Secondary | ICD-10-CM | POA: Diagnosis not present

## 2015-06-02 DIAGNOSIS — M4726 Other spondylosis with radiculopathy, lumbar region: Secondary | ICD-10-CM | POA: Diagnosis not present

## 2015-06-02 DIAGNOSIS — M9903 Segmental and somatic dysfunction of lumbar region: Secondary | ICD-10-CM | POA: Diagnosis not present

## 2015-06-10 DIAGNOSIS — M9903 Segmental and somatic dysfunction of lumbar region: Secondary | ICD-10-CM | POA: Diagnosis not present

## 2015-06-10 DIAGNOSIS — M4726 Other spondylosis with radiculopathy, lumbar region: Secondary | ICD-10-CM | POA: Diagnosis not present

## 2015-06-12 ENCOUNTER — Ambulatory Visit (INDEPENDENT_AMBULATORY_CARE_PROVIDER_SITE_OTHER): Payer: Medicare Other | Admitting: Family Medicine

## 2015-06-12 ENCOUNTER — Encounter: Payer: Self-pay | Admitting: Family Medicine

## 2015-06-12 ENCOUNTER — Ambulatory Visit: Payer: Medicare Other | Admitting: Family Medicine

## 2015-06-12 VITALS — BP 130/68 | HR 67 | Temp 98.3°F | Ht 63.0 in | Wt 139.0 lb

## 2015-06-12 DIAGNOSIS — N3281 Overactive bladder: Secondary | ICD-10-CM

## 2015-06-12 LAB — POCT URINALYSIS DIPSTICK
BILIRUBIN UA: NEGATIVE
Glucose, UA: NEGATIVE
Ketones, UA: NEGATIVE
Leukocytes, UA: NEGATIVE
Nitrite, UA: NEGATIVE
Protein, UA: NEGATIVE
RBC UA: NEGATIVE
Urobilinogen, UA: 2
pH, UA: 6

## 2015-06-12 MED ORDER — MIRABEGRON ER 50 MG PO TB24: 50.0000 mg | ORAL_TABLET | Freq: Every day | ORAL | Status: DC

## 2015-06-12 NOTE — Patient Instructions (Signed)
Overactive Bladder, Adult Overactive bladder is a group of urinary symptoms. With overactive bladder, you may suddenly feel the need to pass urine (urinate) right away. After feeling this sudden urge, you might also leak urine if you cannot get to the bathroom fast enough (urinary incontinence). These symptoms might interfere with your daily work or social activities. Overactive bladder symptoms may also wake you up at night. Overactive bladder affects the nerve signals between your bladder and your brain. Your bladder may get the signal to empty before it is full. Very sensitive muscles can also make your bladder squeeze too soon. CAUSES Many things can cause an overactive bladder. Possible causes include:  Urinary tract infection.  Infection of nearby tissues, such as the prostate.  Prostate enlargement.  Being pregnant with twins or more (multiples).  Surgery on the uterus or urethra.  Bladder stones, inflammation, or tumors.  Drinking too much caffeine or alcohol.  Certain medicines, especially those that you take to help your body get rid of extra fluid (diuretics) by increasing urine production.  Muscle or nerve weakness, especially from:  A spinal cord injury.  Stroke.  Multiple sclerosis.  Parkinson disease.  Diabetes. This can cause a high urine volume that fills the bladder so quickly that the normal urge to urinate is triggered very strongly.  Constipation. A buildup of too much stool can put pressure on your bladder. RISK FACTORS You may be at greater risk for overactive bladder if you:  Are an older adult.  Smoke.  Are going through menopause.  Have prostate problems.  Have a neurological disease, such as stroke, dementia, Parkinson disease, or multiple sclerosis (MS).  Eat or drink things that irritate the bladder. These include alcohol, spicy food, and caffeine.  Are overweight or obese. SIGNS AND SYMPTOMS  The signs and symptoms of an overactive  bladder include:  Sudden, strong urges to urinate.  Leaking urine.  Urinating eight or more times per day.  Waking up to urinate two or more times per night. DIAGNOSIS Your health care provider may suspect overactive bladder based on your symptoms. The health care provider will do a physical exam and take your medical history. Blood or urine tests may also be done. For example, you might need to have a bladder function test to check how well you can hold your urine. You might also need to see a health care provider who specializes in the urinary tract (urologist). TREATMENT Treatment for overactive bladder depends on the cause of your condition and whether it is mild or severe. Certain treatments can be done in your health care provider's office or clinic. You can also make lifestyle changes at home. Options include: Behavioral Treatments  Biofeedback. A specialist uses sensors to help you become aware of your body's signals.  Keeping a daily log of when you need to urinate and what happens after the urge. This may help you manage your condition.  Bladder training. This helps you learn to control the urge to urinate by following a schedule that directs you to urinate at regular intervals (timed voiding). At first, you might have to wait a few minutes after feeling the urge. In time, you should be able to schedule bathroom visits an hour or more apart.  Kegel exercises. These are exercises to strengthen the pelvic floor muscles, which support the bladder. Toning these muscles can help you control urination, even if your bladder muscles are overactive. A specialist will teach you how to do these exercises correctly. They   require daily practice.  Weight loss. If you are obese or overweight, losing weight might relieve your symptoms of overactive bladder. Talk to your health care provider about losing weight and whether there is a specific program or method that would work best for you.  Diet  change. This might help if constipation is making your overactive bladder worse. Your health care provider or a dietitian can explain ways to change what you eat to ease constipation. You might also need to consume less alcohol and caffeine or drink other fluids at different times of the day.  Stopping smoking.  Wearing pads to absorb leakage while you wait for other treatments to take effect. Physical Treatments  Electrical stimulation. Electrodes send gentle pulses of electricity to strengthen the nerves or muscles that help to control the bladder. Sometimes, the electrodes are placed outside of the body. In other cases, they might be placed inside the body (implanted). This treatment can take several months to have an effect.  Supportive devices. Women may need a plastic device that fits into the vagina and supports the bladder (pessary). Medicines Several medicines can help treat overactive bladder and are usually used along with other treatments. Some are injected into the muscles involved in urination. Others come in pill form. Your health care provider may prescribe:  Antispasmodics. These medicines block the signals that the nerves send to the bladder. This keeps the bladder from releasing urine at the wrong time.  Tricyclic antidepressants. These types of antidepressants also relax bladder muscles. Surgery  You may have a device implanted to help manage the nerve signals that indicate when you need to urinate.  You may have surgery to implant electrodes for electrical stimulation.  Sometimes, very severe cases of overactive bladder require surgery to change the shape of the bladder. HOME CARE INSTRUCTIONS   Take medicines only as directed by your health care provider.  Use any implants or a pessary as directed by your health care provider.  Make any diet or lifestyle changes that are recommended by your health care provider. These might include:  Drinking less fluid or  drinking at different times of the day. If you need to urinate often during the night, you may need to stop drinking fluids early in the evening.  Cutting down on caffeine or alcohol. Both can make an overactive bladder worse. Caffeine is found in coffee, tea, and sodas.  Doing Kegel exercises to strengthen muscles.  Losing weight if you need to.  Eating a healthy and balanced diet to prevent constipation.  Keep a journal or log to track how much and when you drink and also when you feel the need to urinate. This will help your health care provider to monitor your condition. SEEK MEDICAL CARE IF:  Your symptoms do not get better after treatment.  Your pain and discomfort are getting worse.  You have more frequent urges to urinate.  You have a fever. SEEK IMMEDIATE MEDICAL CARE IF: You are not able to control your bladder at all.   This information is not intended to replace advice given to you by your health care provider. Make sure you discuss any questions you have with your health care provider.   Document Released: 03/12/2009 Document Revised: 06/06/2014 Document Reviewed: 10/09/2013 Elsevier Interactive Patient Education 2016 [employer]  

## 2015-06-12 NOTE — Progress Notes (Signed)
Patient ID: [name], female    DOB: Oct 23, 1940  Age: 75 y.o. MRN: [id number]    Subjective:  Subjective HPI CHRISHELLE FEST presents for c/o oab last week.  She states it seems to be better now.     Review of Systems  Constitutional: Negative for diaphoresis, appetite change, fatigue and unexpected weight change.  Eyes: Negative for pain, redness and visual disturbance.  Respiratory: Negative for cough, chest tightness, shortness of breath and wheezing.   Cardiovascular: Negative for chest pain, palpitations and leg swelling.  Endocrine: Negative for cold intolerance, heat intolerance, polydipsia, polyphagia and polyuria.  Genitourinary: Negative for dysuria, frequency and difficulty urinating.  Neurological: Negative for dizziness, light-headedness, numbness and headaches.    History Past Medical History  Diagnosis Date  . Hyperlipidemia   . Hypertension 2004    Hypertensive response on Stress Test  . DVT (deep venous thrombosis) ([place]) 2006    post immobilization post cns surgery  . PTE (pulmonary thromboembolism) ([place]) 2006  . Basal cell cancer     LUE; Porokeratosis also  . Granulomatous lung disease 2002    incidental Xray finding  . Paralysis ([place]) 2006    post cervical fusion with spinal sac tear  with hematoma   . Asthma     adult onset  . Diabetes mellitus 2010    A1c 6.7%  . GERD (gastroesophageal reflux disease)     very mild  . Arthritis   . Complication of anesthesia     small trachea    She has past surgical history that includes Bunionectomy; Septoplasty; Tubal ligation; Colonoscopy (1992 & 2002); Rotator cuff repair (2009); epidural steroids (2006); Cervical fusion (2006); Tonsillectomy (75 years old); and Total hip arthroplasty (06/01/2012).   Her family history includes COPD in her father; Cancer in her mother; Diabetes in her sister; Heart disease in her sister; Stroke in her maternal grandmother; Transient ischemic attack in her paternal aunt.She reports  that she has never smoked. She has never used smokeless tobacco. She reports that she drinks alcohol. She reports that she does not use illicit drugs.  Current Outpatient Prescriptions on File Prior to Visit  Medication Sig Dispense Refill  . albuterol (PROAIR HFA) 108 (90 BASE) MCG/ACT inhaler Inhale 2 puffs into the lungs every 6 (six) hours as needed for wheezing or shortness of breath. 3 Inhaler 3  . aspirin 81 MG tablet Take 81 mg by mouth daily.    . calcium gluconate 500 MG tablet Take 600 mg by mouth 2 (two) times daily.     . cholecalciferol (VITAMIN D) 1000 UNITS tablet Take 1,000 Units by mouth daily. 2 pills daily    . cyclobenzaprine (FLEXERIL) 10 MG tablet Take 1 tablet (10 mg total) by mouth as needed. For Hip concerns 30 tablet 1  . docusate sodium (COLACE) 100 MG capsule Take 100-200 mg by mouth every morning.    . Fluticasone-Salmeterol (ADVAIR DISKUS) 250-50 MCG/DOSE AEPB Inhale 1 puff into the lungs 2 (two) times daily. Rinse mouth 180 each 3  . gabapentin (NEURONTIN) 300 MG capsule   1  . glucose blood (ONETOUCH VERIO) test strip Check blood sugar twice daily 200 each 3  . metFORMIN (GLUCOPHAGE-XR) 500 MG 24 hr tablet take 1 tablet by mouth once daily WITH BREAKFAST 90 tablet 3  . Multiple Vitamins-Minerals (MULTIVITAMIN) LIQD Take by mouth. Vemma    . ONETOUCH DELICA LANCETS FINE MISC Check blood sugar twice daily 200 each 3  . polyethylene glycol (MIRALAX /  GLYCOLAX) packet Take 17 g by mouth daily.    . traMADol (ULTRAM) 50 MG tablet Take 1 tablet by mouth every 6 (six) hours as needed.  0   No current facility-administered medications on file prior to visit.     Objective:  Objective Physical Exam  Constitutional: She is oriented to person, place, and time. She appears well-developed and well-nourished.  HENT:  Head: Normocephalic and atraumatic.  Eyes: Conjunctivae and EOM are normal.  Neck: Normal range of motion. Neck supple. No JVD present. Carotid bruit is  not present. No thyromegaly present.  Cardiovascular: Normal rate, regular rhythm and normal heart sounds.   No murmur heard. Pulmonary/Chest: Effort normal and breath sounds normal. No respiratory distress. She has no wheezes. She has no rales. She exhibits no tenderness.  Musculoskeletal: She exhibits no edema.  Neurological: She is alert and oriented to person, place, and time.  Psychiatric: She has a normal mood and affect.  Nursing note and vitals reviewed.  BP 130/68 mmHg  Pulse 67  Temp(Src) 98.3 F (36.8 C) (Oral)  Ht 5\' 3"  (1.6 m)  Wt 139 lb (63.05 kg)  BMI 24.63 kg/m2  SpO2 95% Wt Readings from Last 3 Encounters:  06/12/15 139 lb (63.05 kg)  05/05/15 135 lb 9.6 oz (61.508 kg)  02/16/15 135 lb 12.8 oz (61.598 kg)     Lab Results  Component Value Date   WBC 7.4 06/02/2014   HGB 11.8* 06/02/2014   HCT 36.9 06/02/2014   PLT 269.0 06/02/2014   GLUCOSE 111* 06/18/2014   CHOL 223* 06/02/2014   TRIG 287.0* 06/02/2014   HDL 41.20 06/02/2014   LDLDIRECT 125.7 06/02/2014   LDLCALC 108* 06/16/2008   ALT 15 06/18/2014   AST 17 06/18/2014   NA 137 06/18/2014   K 4.2 06/18/2014   CL 104 06/18/2014   CREATININE 0.85 06/18/2014   BUN 20 06/18/2014   CO2 29 06/18/2014   TSH 2.60 04/05/2013   INR 0.96 05/28/2012   HGBA1C 6.8* 06/02/2014   MICROALBUR 0.4 06/02/2014    Mr Lumbar Spine Wo Contrast  08/10/2014  CLINICAL DATA:  Initial evaluation for low back pain with tingling and right leg pain, numbness in bilateral feet. EXAM: MRI LUMBAR SPINE WITHOUT CONTRAST TECHNIQUE: Multiplanar, multisequence MR imaging of the lumbar spine was performed. No intravenous contrast was administered. COMPARISON:  Prior study from 12/08/2006. FINDINGS: For the purposes of this dictation, the lowest well-formed intervertebral disc spaces presumed to be the L5-S1 level, and there presumed to be 5 lumbar type vertebral bodies. Mild dextroscoliosis of the lumbar spine with apex at L2-3 again  noted. 2 mm anterolisthesis of L5 on S1 present. Otherwise, there is preservation of the normal lumbar lordosis Vertebral body heights are well preserved. Signal intensity within the vertebral body bone marrow is normal. T2 hyperintense lesion partially visualized in the right iliac wing noted, likely degenerative in nature. No other focal osseous lesion. Conus medullaris terminates normally at the L1-2 level. Signal intensity within the visualized cord is unremarkable. Nerve roots of the cauda equina within normal limits. Paraspinous soft tissues are unremarkable. T11-12:  Negative. T12-L1:  Negative. L1-2: Minimal degenerative disc bulge with disc desiccation. No focal disc protrusion. Mild bilateral facet hypertrophy. No significant stenosis. L2-3: Mild disc bulge with disc desiccation. No focal disc protrusion. Bilateral facet hypertrophy with mild ligamentum flavum thickening. No canal or foraminal stenosis. L3-4: Mild disc bulge with intervertebral disc space narrowing and disc desiccation. No focal disc herniation. Small posterior  annular fissure noted. There is moderate bilateral facet hypertrophy with ligamentum flavum thickening, slightly worse on the left. The thickened ligamentum flavum mildly encroaches upon the left lateral recess. The transiting left L4 nerve root is displaced anteriorly without frank impingement. There is mild canal and left neural foraminal narrowing. Right neural foramen widely patent. L4-5: Mild disc bulge with disc desiccation and intervertebral disc space narrowing. Tiny shallow disc protrusion with associated annular fissure. Bilateral facet degeneration present. Small synovial cyst seen on prior study at the left L4-5 facet no longer visualized. There is mild canal narrowing. Mild to moderate right foraminal stenosis present. L5-S1: Disc bulge with disc desiccation and intervertebral disc space narrowing. 2 mm anterolisthesis noted. Probable small posterior annular fissure.  The right L5-S1 facet is malformed, stable. There is left-sided facet degeneration. No significant canal or left neural foraminal stenosis. Mild right foraminal narrowing without impingement. IMPRESSION: 1. Mild dextroscoliosis of the lumbar spine with apex at L2-3. 2. Shallow disc protrusion with associated annular fissure at L4-5 which contacts the thecal sac without neural impingement. Mild to moderate right foraminal narrowing at this level due to disc bulge and bilateral facet arthrosis. 3. Bilateral facet arthrosis at L3-4, greater on the left. The transiting left L4 nerve root is displaced anteriorly in the left lateral recess by the hypertrophied left L3-4 facet without frank neural impingement. 4. 2 mm anterolisthesis of L5 on S1 with associated mild disc bulge and annular fissure. Right L5-S1 facet is improperly developed with left-sided facet arthrosis. Electronically Signed   By: Jeannine Boga M.D.   On: 08/10/2014 20:39     Assessment & Plan:  Plan I am having Ms. Adwell start on mirabegron ER. I am also having her maintain her MULTIVITAMIN, calcium gluconate, cholecalciferol, polyethylene glycol, docusate sodium, gabapentin, ONETOUCH DELICA LANCETS FINE, metFORMIN, glucose blood, cyclobenzaprine, traMADol, aspirin, albuterol, Fluticasone-Salmeterol, and meloxicam.  Meds ordered this encounter  Medications  . meloxicam (MOBIC) 15 MG tablet    Sig: Take 1 tablet by mouth as needed.    Refill:  1  . mirabegron ER (MYRBETRIQ) 50 MG TB24 tablet    Sig: Take 1 tablet (50 mg total) by mouth daily.    Dispense:  30 tablet    Refill:  5    Problem List Items Addressed This Visit    None    Visit Diagnoses    OAB (overactive bladder)    -  Primary    Relevant Medications    mirabegron ER (MYRBETRIQ) 50 MG TB24 tablet    Other Relevant Orders    POCT Urinalysis Dipstick (Completed)       Follow-up: Return if symptoms worsen or fail to improve.  Garnet Koyanagi, DO

## 2015-06-12 NOTE — Progress Notes (Signed)
Pre visit review using our clinic review tool, if applicable. No additional management support is needed unless otherwise documented below in the visit note. 

## 2015-06-17 DIAGNOSIS — M4726 Other spondylosis with radiculopathy, lumbar region: Secondary | ICD-10-CM | POA: Diagnosis not present

## 2015-06-17 DIAGNOSIS — M9903 Segmental and somatic dysfunction of lumbar region: Secondary | ICD-10-CM | POA: Diagnosis not present

## 2015-06-24 ENCOUNTER — Other Ambulatory Visit: Payer: Self-pay | Admitting: Internal Medicine

## 2015-06-29 DIAGNOSIS — M4726 Other spondylosis with radiculopathy, lumbar region: Secondary | ICD-10-CM | POA: Diagnosis not present

## 2015-06-29 DIAGNOSIS — M9903 Segmental and somatic dysfunction of lumbar region: Secondary | ICD-10-CM | POA: Diagnosis not present

## 2015-07-03 ENCOUNTER — Telehealth: Payer: Self-pay | Admitting: *Deleted

## 2015-07-03 NOTE — Telephone Encounter (Signed)
Received paperwork for exercise clearance from [facility] [employer] [facility]; forward to provider/SLS

## 2015-07-06 DIAGNOSIS — M4317 Spondylolisthesis, lumbosacral region: Secondary | ICD-10-CM | POA: Diagnosis not present

## 2015-07-06 DIAGNOSIS — M4806 Spinal stenosis, lumbar region: Secondary | ICD-10-CM | POA: Diagnosis not present

## 2015-07-06 DIAGNOSIS — M5416 Radiculopathy, lumbar region: Secondary | ICD-10-CM | POA: Diagnosis not present

## 2015-07-06 DIAGNOSIS — M4120 Other idiopathic scoliosis, site unspecified: Secondary | ICD-10-CM | POA: Diagnosis not present

## 2015-07-07 DIAGNOSIS — M4726 Other spondylosis with radiculopathy, lumbar region: Secondary | ICD-10-CM | POA: Diagnosis not present

## 2015-07-07 DIAGNOSIS — M9903 Segmental and somatic dysfunction of lumbar region: Secondary | ICD-10-CM | POA: Diagnosis not present

## 2015-07-10 ENCOUNTER — Other Ambulatory Visit: Payer: Self-pay | Admitting: Neurosurgery

## 2015-07-10 DIAGNOSIS — M48061 Spinal stenosis, lumbar region without neurogenic claudication: Secondary | ICD-10-CM

## 2015-07-15 DIAGNOSIS — M4726 Other spondylosis with radiculopathy, lumbar region: Secondary | ICD-10-CM | POA: Diagnosis not present

## 2015-07-15 DIAGNOSIS — M9903 Segmental and somatic dysfunction of lumbar region: Secondary | ICD-10-CM | POA: Diagnosis not present

## 2015-07-18 ENCOUNTER — Ambulatory Visit
Admission: RE | Admit: 2015-07-18 | Discharge: 2015-07-18 | Disposition: A | Discharge status: Home / Self Care | Payer: Medicare Other | Source: Ambulatory Visit | Attending: Neurosurgery | Admitting: Neurosurgery

## 2015-07-18 DIAGNOSIS — M48061 Spinal stenosis, lumbar region without neurogenic claudication: Secondary | ICD-10-CM

## 2015-07-18 DIAGNOSIS — M47816 Spondylosis without myelopathy or radiculopathy, lumbar region: Secondary | ICD-10-CM | POA: Diagnosis not present

## 2015-07-27 DIAGNOSIS — H2513 Age-related nuclear cataract, bilateral: Secondary | ICD-10-CM | POA: Diagnosis not present

## 2015-07-27 DIAGNOSIS — E119 Type 2 diabetes mellitus without complications: Secondary | ICD-10-CM | POA: Diagnosis not present

## 2015-07-27 DIAGNOSIS — H40013 Open angle with borderline findings, low risk, bilateral: Secondary | ICD-10-CM | POA: Diagnosis not present

## 2015-07-31 ENCOUNTER — Ambulatory Visit (INDEPENDENT_AMBULATORY_CARE_PROVIDER_SITE_OTHER): Payer: Medicare Other | Admitting: Adult Health

## 2015-07-31 ENCOUNTER — Telehealth: Payer: Self-pay | Admitting: Internal Medicine

## 2015-07-31 ENCOUNTER — Encounter: Payer: Self-pay | Admitting: Adult Health

## 2015-07-31 VITALS — BP 118/66 | HR 62 | Temp 98.0°F | Ht 63.0 in | Wt 142.0 lb

## 2015-07-31 DIAGNOSIS — R05 Cough: Secondary | ICD-10-CM

## 2015-07-31 DIAGNOSIS — J45909 Unspecified asthma, uncomplicated: Secondary | ICD-10-CM

## 2015-07-31 DIAGNOSIS — J45998 Other asthma: Secondary | ICD-10-CM

## 2015-07-31 DIAGNOSIS — R059 Cough, unspecified: Secondary | ICD-10-CM

## 2015-07-31 MED ORDER — LEVALBUTEROL HCL 0.63 MG/3ML IN NEBU
0.6300 mg | INHALATION_SOLUTION | Freq: Once | RESPIRATORY_TRACT | Status: AC
  Administered 2015-07-31: 0.63 mg via RESPIRATORY_TRACT

## 2015-07-31 MED ORDER — PREDNISONE 10 MG PO TABS: ORAL_TABLET | ORAL | Status: DC

## 2015-07-31 MED ORDER — AZITHROMYCIN 250 MG PO TABS: ORAL_TABLET | ORAL | Status: AC

## 2015-07-31 NOTE — Patient Instructions (Signed)
Prednisone taper over next week .  Zyrtec 10mg  At bedtime  As needed  Drainage  Zpack to have on hold if symptoms worsen with discolored mucus .  follow up Dr. Annamaria Boots  As planned and As needed   Please contact office for sooner follow up if symptoms do not improve or worsen or seek emergency care

## 2015-07-31 NOTE — Telephone Encounter (Signed)
Spoke with pt. Feels that she is having an asthma flare up. She would like to be seen today. OV has been scheduled for today at 11:45am with TP. Nothing further was needed.

## 2015-07-31 NOTE — Progress Notes (Signed)
Subjective:    Patient ID: [name], female    DOB: 06-19-1940, 75 y.o.   MRN: [id number]  HPI 75 yo female never smoker with AR and Asthma    07/31/2015 Acute OV  Pt presents for an acute office visit. . Complains of  sob, chest tightness, dry cough, sinus drainage, and wheezing starting on 07/30/15, Denies any chest congestion, sinus congestion, fever, nausea or vomiting.  Feels like she has caught a virus or something.  No body aches or chills.  Does have a hoarseness and drainage . Claritin not working.   Past Medical History  Diagnosis Date  . Hyperlipidemia   . Hypertension 2004    Hypertensive response on Stress Test  . DVT (deep venous thrombosis) ([facility]) 2006    post immobilization post cns surgery  . PTE (pulmonary thromboembolism) ([facility]) 2006  . Basal cell cancer     LUE; Porokeratosis also  . Granulomatous lung disease 2002    incidental Xray finding  . Paralysis ([facility]) 2006    post cervical fusion with spinal sac tear  with hematoma   . Asthma     adult onset  . Diabetes mellitus 2010    A1c 6.7%  . GERD (gastroesophageal reflux disease)     very mild  . Arthritis   . Complication of anesthesia     small trachea   Current Outpatient Prescriptions on File Prior to Visit  Medication Sig Dispense Refill  . ADVAIR DISKUS 250-50 MCG/DOSE AEPB Use 1 inhalation two times  daily ;rinse mouth 180 each 3  . albuterol (PROAIR HFA) 108 (90 BASE) MCG/ACT inhaler Inhale 2 puffs into the lungs every 6 (six) hours as needed for wheezing or shortness of breath. 3 Inhaler 3  . aspirin 81 MG tablet Take 81 mg by mouth daily.    . calcium gluconate 500 MG tablet Take 600 mg by mouth 2 (two) times daily.     . cholecalciferol (VITAMIN D) 1000 UNITS tablet Take 1,000 Units by mouth daily. 2 pills daily    . cyclobenzaprine (FLEXERIL) 10 MG tablet Take 1 tablet (10 mg total) by mouth as needed. For Hip concerns 30 tablet 1  . docusate sodium (COLACE) 100 MG capsule Take 100-200 mg by  mouth every morning.    . gabapentin (NEURONTIN) 300 MG capsule   1  . glucose blood (ONETOUCH VERIO) test strip Check blood sugar twice daily 200 each 3  . meloxicam (MOBIC) 15 MG tablet Take 1 tablet by mouth as needed.  1  . metFORMIN (GLUCOPHAGE-XR) 500 MG 24 hr tablet take 1 tablet by mouth once daily WITH BREAKFAST 90 tablet 3  . mirabegron ER (MYRBETRIQ) 50 MG TB24 tablet Take 1 tablet (50 mg total) by mouth daily. 30 tablet 5  . Multiple Vitamins-Minerals (MULTIVITAMIN) LIQD Take by mouth. Vemma    . ONETOUCH DELICA LANCETS FINE MISC Check blood sugar twice daily 200 each 3  . polyethylene glycol (MIRALAX / GLYCOLAX) packet Take 17 g by mouth daily.    . traMADol (ULTRAM) 50 MG tablet Take 1 tablet by mouth every 6 (six) hours as needed.  0   No current facility-administered medications on file prior to visit.      Review of Systems Constitutional:   No  weight loss, night sweats,  Fevers, chills, fatigue, or  lassitude.  HEENT:   No headaches,  Difficulty swallowing,  Tooth/dental problems, or  Sore throat,  No sneezing, itching, ear ache, nasal congestion, post nasal drip,   CV:  No chest pain,  Orthopnea, PND, swelling in lower extremities, anasarca, dizziness, palpitations, syncope.   GI  No heartburn, indigestion, abdominal pain, nausea, vomiting, diarrhea, change in bowel habits, loss of appetite, bloody stools.   Resp:   No chest wall deformity  Skin: no rash or lesions.  GU: no dysuria, change in color of urine, no urgency or frequency.  No flank pain, no hematuria   MS:  No joint pain or swelling.  No decreased range of motion.  No back pain.  Psych:  No change in mood or affect. No depression or anxiety.  No memory loss.         Objective:   Physical Exam  Filed Vitals:   07/31/15 1207  BP: 118/66  Pulse: 62  Temp: 98 F (36.7 C)  TempSrc: Oral  Height: 5\' 3"  (1.6 m)  Weight: 142 lb (64.411 kg)  SpO2: 99%   GEN: A/Ox3; pleasant ,  elderly   HEENT:  [place]/AT,  EACs-clear, TMs-wnl, NOSE-clear, THROAT-clear, no lesions, no postnasal drip or exudate noted.   NECK:  Supple w/ fair ROM; no JVD; normal carotid impulses w/o bruits; no thyromegaly or nodules palpated; no lymphadenopathy.  RESP  Clear  P & A; w/o, wheezes/ rales/ or rhonchi.no accessory muscle use, no dullness to percussion  CARD:  RRR, no m/r/g  , no peripheral edema, pulses intact, no cyanosis or clubbing.  GI:   Soft & nt; nml bowel sounds; no organomegaly or masses detected.  Musco: Warm bil, no deformities or joint swelling noted.   Neuro: alert, no focal deficits noted.    Skin: Warm, no lesions or rashes  Kazi Reppond NP-C  [facility]  07/31/2015         Assessment & Plan:

## 2015-07-31 NOTE — Assessment & Plan Note (Addendum)
Flare with URI /AR  Xopenex neb x 1   Plan  Prednisone taper over next week .  Zyrtec 10mg  At bedtime  As needed  Drainage  Zpack to have on hold if symptoms worsen with discolored mucus .  follow up Dr. Annamaria Boots  As planned and As needed   Please contact office for sooner follow up if symptoms do not improve or worsen or seek emergency care

## 2015-08-04 ENCOUNTER — Telehealth: Payer: Self-pay | Admitting: Internal Medicine

## 2015-08-04 MED ORDER — PREDNISONE 10 MG PO TABS: ORAL_TABLET | ORAL | Status: DC

## 2015-08-04 NOTE — Telephone Encounter (Signed)
This may be more allergy or virus. In either case antibiotic may not help. We can change antibiotics if she finds she needs. If there is a lot of wheezing, it may help to try prednisone 10 mg, # 20, 4 X 2 DAYS, 3 X 2 DAYS, 2 X 2 DAYS, 1 X 2 DAYS.

## 2015-08-04 NOTE — Telephone Encounter (Signed)
Spoke with pt, states she saw TP on Friday but is worsening-cough is deeper and mostly nonprod with some yellow mucus, sinus congestion, PND, sore throat, sob, wheezing.   Denies fever, chest pain, tightness.   Pt started pred taper on Friday and zpak on Saturday> both given by TP on 3/3 ov.  Pt requesting further recs.   Pt uses [facility] on [address].    CY please advise.  Thanks!   Allergies  Allergen Reactions  . Statins Other (See Comments)    Myalgias and muscle weakness   Current Outpatient Prescriptions on File Prior to Visit  Medication Sig Dispense Refill  . ADVAIR DISKUS 250-50 MCG/DOSE AEPB Use 1 inhalation two times  daily ;rinse mouth 180 each 3  . albuterol (PROAIR HFA) 108 (90 BASE) MCG/ACT inhaler Inhale 2 puffs into the lungs every 6 (six) hours as needed for wheezing or shortness of breath. 3 Inhaler 3  . aspirin 81 MG tablet Take 81 mg by mouth daily.    [name] azithromycin (ZITHROMAX Z-PAK) 250 MG tablet Take 2 tablets (500 mg) on  Day 1,  followed by 1 tablet (250 mg) once daily on Days 2 through 5. 6 each 0  . calcium gluconate 500 MG tablet Take 600 mg by mouth 2 (two) times daily.     . cholecalciferol (VITAMIN D) 1000 UNITS tablet Take 1,000 Units by mouth daily. 2 pills daily    . cyclobenzaprine (FLEXERIL) 10 MG tablet Take 1 tablet (10 mg total) by mouth as needed. For Hip concerns 30 tablet 1  . docusate sodium (COLACE) 100 MG capsule Take 100-200 mg by mouth every morning.    . gabapentin (NEURONTIN) 300 MG capsule   1  . glucose blood (ONETOUCH VERIO) test strip Check blood sugar twice daily 200 each 3  . meloxicam (MOBIC) 15 MG tablet Take 1 tablet by mouth as needed.  1  . metFORMIN (GLUCOPHAGE-XR) 500 MG 24 hr tablet take 1 tablet by mouth once daily WITH BREAKFAST 90 tablet 3  . mirabegron ER (MYRBETRIQ) 50 MG TB24 tablet Take 1 tablet (50 mg total) by mouth daily. 30 tablet 5  . Multiple Vitamins-Minerals (MULTIVITAMIN) LIQD Take by mouth. Vemma    . ONETOUCH  DELICA LANCETS FINE MISC Check blood sugar twice daily 200 each 3  . polyethylene glycol (MIRALAX / GLYCOLAX) packet Take 17 g by mouth daily.    . predniSONE (DELTASONE) 10 MG tablet 4 tabs for 2 days, then 3 tabs for 2 days, 2 tabs for 2 days, then 1 tab for 2 days, then stop 20 tablet 0  . traMADol (ULTRAM) 50 MG tablet Take 1 tablet by mouth every 6 (six) hours as needed.  0   No current facility-administered medications on file prior to visit.

## 2015-08-04 NOTE — Telephone Encounter (Signed)
Called and spoke with pt. Reviewed recs. PT states she is taking antibiotics and his finished her pred taper. Verified pharmacy as [facility] on [address]. Pt is requesting pred taper be sent into the pharmacy. Pt voiced understanding and had no further questions.

## 2015-08-26 DIAGNOSIS — M4317 Spondylolisthesis, lumbosacral region: Secondary | ICD-10-CM | POA: Diagnosis not present

## 2015-08-26 DIAGNOSIS — M4120 Other idiopathic scoliosis, site unspecified: Secondary | ICD-10-CM | POA: Diagnosis not present

## 2015-08-26 DIAGNOSIS — M4806 Spinal stenosis, lumbar region: Secondary | ICD-10-CM | POA: Diagnosis not present

## 2015-08-26 DIAGNOSIS — R030 Elevated blood-pressure reading, without diagnosis of hypertension: Secondary | ICD-10-CM | POA: Diagnosis not present

## 2015-08-26 DIAGNOSIS — M5416 Radiculopathy, lumbar region: Secondary | ICD-10-CM | POA: Diagnosis not present

## 2015-09-08 ENCOUNTER — Telehealth: Payer: Self-pay | Admitting: Internal Medicine

## 2015-09-08 NOTE — Telephone Encounter (Signed)
LVM for pt to return call

## 2015-09-09 NOTE — Telephone Encounter (Signed)
Called and left detailed message on voicemail asking patient if she would like [place] to send ProAir refills to her pharmacy or if she wanted printed out RX. Awaiting call back from patient.

## 2015-09-10 ENCOUNTER — Other Ambulatory Visit: Payer: Self-pay | Admitting: Family Medicine

## 2015-09-10 MED ORDER — ALBUTEROL SULFATE HFA 108 (90 BASE) MCG/ACT IN AERS: 2.0000 | INHALATION_SPRAY | Freq: Four times a day (QID) | RESPIRATORY_TRACT | Status: DC | PRN

## 2015-09-10 NOTE — Telephone Encounter (Signed)
Called and spoke to pt. Pt requesting a new rx for his albuterol hfa because she lost the last printed rx. Pt request the rx be sent to [facility], sent rx. Pt verbalized understanding and denied any further questions or concerns at this time.

## 2015-09-14 ENCOUNTER — Other Ambulatory Visit: Payer: Self-pay | Admitting: Family Medicine

## 2015-09-15 DIAGNOSIS — M5416 Radiculopathy, lumbar region: Secondary | ICD-10-CM | POA: Diagnosis not present

## 2015-09-15 DIAGNOSIS — M4317 Spondylolisthesis, lumbosacral region: Secondary | ICD-10-CM | POA: Diagnosis not present

## 2015-09-15 DIAGNOSIS — M4806 Spinal stenosis, lumbar region: Secondary | ICD-10-CM | POA: Diagnosis not present

## 2015-09-15 NOTE — Telephone Encounter (Signed)
Last cyclobenzaprine:  03/17/15, #30. No previous CSC or UDS on file. Next OV:  10/2015.  Please advise request.

## 2015-09-18 ENCOUNTER — Encounter: Payer: Medicare Other | Admitting: Family Medicine

## 2015-10-12 DIAGNOSIS — M5416 Radiculopathy, lumbar region: Secondary | ICD-10-CM | POA: Diagnosis not present

## 2015-10-12 DIAGNOSIS — M4806 Spinal stenosis, lumbar region: Secondary | ICD-10-CM | POA: Diagnosis not present

## 2015-10-12 DIAGNOSIS — M4317 Spondylolisthesis, lumbosacral region: Secondary | ICD-10-CM | POA: Diagnosis not present

## 2015-11-04 DIAGNOSIS — M4120 Other idiopathic scoliosis, site unspecified: Secondary | ICD-10-CM | POA: Diagnosis not present

## 2015-11-04 DIAGNOSIS — M5416 Radiculopathy, lumbar region: Secondary | ICD-10-CM | POA: Diagnosis not present

## 2015-11-04 DIAGNOSIS — M4806 Spinal stenosis, lumbar region: Secondary | ICD-10-CM | POA: Diagnosis not present

## 2015-11-04 DIAGNOSIS — M4317 Spondylolisthesis, lumbosacral region: Secondary | ICD-10-CM | POA: Diagnosis not present

## 2015-11-24 ENCOUNTER — Ambulatory Visit (INDEPENDENT_AMBULATORY_CARE_PROVIDER_SITE_OTHER): Payer: Medicare Other | Admitting: Family Medicine

## 2015-11-24 ENCOUNTER — Encounter: Payer: Self-pay | Admitting: Family Medicine

## 2015-11-24 VITALS — BP 153/83 | HR 72 | Temp 98.4°F | Ht 63.0 in | Wt 144.6 lb

## 2015-11-24 DIAGNOSIS — Z0000 Encounter for general adult medical examination without abnormal findings: Secondary | ICD-10-CM

## 2015-11-24 DIAGNOSIS — E1165 Type 2 diabetes mellitus with hyperglycemia: Secondary | ICD-10-CM | POA: Diagnosis not present

## 2015-11-24 DIAGNOSIS — E1151 Type 2 diabetes mellitus with diabetic peripheral angiopathy without gangrene: Secondary | ICD-10-CM

## 2015-11-24 DIAGNOSIS — E559 Vitamin D deficiency, unspecified: Secondary | ICD-10-CM

## 2015-11-24 DIAGNOSIS — M419 Scoliosis, unspecified: Secondary | ICD-10-CM

## 2015-11-24 DIAGNOSIS — IMO0002 Reserved for concepts with insufficient information to code with codable children: Secondary | ICD-10-CM

## 2015-11-24 DIAGNOSIS — Z1239 Encounter for other screening for malignant neoplasm of breast: Secondary | ICD-10-CM | POA: Diagnosis not present

## 2015-11-24 DIAGNOSIS — E2839 Other primary ovarian failure: Secondary | ICD-10-CM | POA: Diagnosis not present

## 2015-11-24 LAB — POCT URINALYSIS DIPSTICK
BILIRUBIN UA: NEGATIVE
GLUCOSE UA: NEGATIVE
KETONES UA: NEGATIVE
Leukocytes, UA: NEGATIVE
Nitrite, UA: NEGATIVE
Protein, UA: NEGATIVE
RBC UA: NEGATIVE
SPEC GRAV UA: 1.025
UROBILINOGEN UA: 0.2
pH, UA: 6

## 2015-11-24 MED ORDER — CYCLOBENZAPRINE HCL 10 MG PO TABS: ORAL_TABLET | ORAL | Status: DC

## 2015-11-24 NOTE — Progress Notes (Signed)
Pre visit review using our clinic review tool, if applicable. No additional management support is needed unless otherwise documented below in the visit note. 

## 2015-11-24 NOTE — Progress Notes (Signed)
Subjective:   [name] is a 75 y.o. female who presents for Medicare Annual (Subsequent) preventive examination.  Review of Systems:   Review of Systems  Constitutional: Negative for activity change, appetite change and fatigue.  HENT: Negative for hearing loss, congestion, tinnitus and ear discharge.   Eyes: Negative for visual disturbance (see optho q1y -- vision corrected to 20/20 with glasses).  Respiratory: Negative for cough, chest tightness and shortness of breath.   Cardiovascular: Negative for chest pain, palpitations and leg swelling.  Gastrointestinal: Negative for abdominal pain, diarrhea, constipation and abdominal distention.  Genitourinary: Negative for urgency, frequency, decreased urine volume and difficulty urinating.  Musculoskeletal: Negative for back pain, arthralgias and gait problem.  Skin: Negative for color change, pallor and rash.  Neurological: Negative for dizziness, light-headedness, numbness and headaches.  Hematological: Negative for adenopathy. Does not bruise/bleed easily.  Psychiatric/Behavioral: Negative for suicidal ideas, confusion, sleep disturbance, self-injury, dysphoric mood, decreased concentration and agitation.  Pt is able to read and write and can do all ADLs No risk for falling No abuse/ violence in home          Objective:     Vitals: BP 153/83 mmHg  Pulse 72  Temp(Src) 98.4 F (36.9 C) (Oral)  Ht 5\' 3"  (1.6 m)  Wt 144 lb 9.6 oz (65.59 kg)  BMI 25.62 kg/m2  SpO2 98%  Body mass index is 25.62 kg/(m^2).  BP 153/83 mmHg  Pulse 72  Temp(Src) 98.4 F (36.9 C) (Oral)  Ht 5\' 3"  (1.6 m)  Wt 144 lb 9.6 oz (65.59 kg)  BMI 25.62 kg/m2  SpO2 98% General appearance: alert, cooperative, appears stated age and no distress Head: Normocephalic, without obvious abnormality, atraumatic Eyes: conjunctivae/corneas clear. PERRL, EOM's intact. Fundi benign. Ears: normal TM's and external ear canals both ears Nose: Nares normal. Septum  midline. Mucosa normal. No drainage or sinus tenderness. Throat: lips, mucosa, and tongue normal; teeth and gums normal Neck: no adenopathy, no carotid bruit, no JVD, supple, symmetrical, trachea midline and thyroid not enlarged, symmetric, no tenderness/mass/nodules Back: symmetric, no curvature. ROM normal. No CVA tenderness. Lungs: clear to auscultation bilaterally Breasts: gyn Heart: regular rate and rhythm, S1, S2 normal, no murmur, click, rub or gallop Abdomen: soft, non-tender; bowel sounds normal; no masses,  no organomegaly Pelvic: deferred Extremities: extremities normal, atraumatic, no cyanosis or edema Pulses: 2+ and symmetric Skin: Skin color, texture, turgor normal. No rashes or lesions Lymph nodes: Cervical, supraclavicular, and axillary nodes normal. Neurologic: Alert and oriented X 3, normal strength and tone. Normal symmetric reflexes. Normal coordination and gait Psych- no depression, no anxiety Tobacco History  Smoking status  . Never Smoker   Smokeless tobacco  . Never Used     Counseling given: Not Answered   Past Medical History  Diagnosis Date  . Hyperlipidemia   . Hypertension 2004    Hypertensive response on Stress Test  . DVT (deep venous thrombosis) ([place]) 2006    post immobilization post cns surgery  . PTE (pulmonary thromboembolism) ([place]) 2006  . Basal cell cancer     LUE; Porokeratosis also  . Granulomatous lung disease 2002    incidental Xray finding  . Paralysis ([place]) 2006    post cervical fusion with spinal sac tear  with hematoma   . Asthma     adult onset  . Diabetes mellitus 2010    A1c 6.7%  . GERD (gastroesophageal reflux disease)     very mild  . Arthritis   .  Complication of anesthesia     small trachea   Past Surgical History  Procedure Laterality Date  . Bunionectomy    . Septoplasty    . Tubal ligation    . Colonoscopy  1992 & 2002    negative  . Rotator cuff repair  2009    Right  . Epidural steroids  2006     cervical spine  . Cervical fusion  2006    Dr Annette Stable, NS;post op hematoma & cns leak & urinary retention  . Tonsillectomy  75 years old  . Total hip arthroplasty  06/01/2012    Procedure: TOTAL HIP ARTHROPLASTY ANTERIOR APPROACH;  Surgeon: Mcarthur Rossetti, MD;  Location: [facility];  Service: Orthopedics;  Laterality: Left;  Left Total Hip Arthroplasty   Family History  Problem Relation Age of Onset  . COPD Father     emphysema  . Cancer Mother     cns cancer  . Diabetes Sister     TWIN sister ; also Fibromyalgia ; S/P stent 2004  . Heart disease Sister     stents @ 43 & 63  . Stroke Maternal Grandmother     in  late 5s  . Transient ischemic attack Paternal Aunt    History  Sexual Activity  . Sexual Activity: Not Currently    Outpatient Encounter Prescriptions as of 11/24/2015  Medication Sig  . ADVAIR DISKUS 250-50 MCG/DOSE AEPB Use 1 inhalation two times  daily ;rinse mouth  . albuterol (PROAIR HFA) 108 (90 Base) MCG/ACT inhaler Inhale 2 puffs into the lungs every 6 (six) hours as needed for wheezing or shortness of breath.  [name] aspirin 81 MG tablet Take 81 mg by mouth daily.  . calcium gluconate 500 MG tablet Take 600 mg by mouth 2 (two) times daily.   . cetirizine (ZYRTEC) 10 MG tablet Take 10 mg by mouth daily.  . cholecalciferol (VITAMIN D) 1000 UNITS tablet Take 1,000 Units by mouth daily. 2 pills daily  . cyclobenzaprine (FLEXERIL) 10 MG tablet TAKE 1 TABLET BY MOUTH AS NEEDED FOR HIP CONCERNS  . docusate sodium (COLACE) 100 MG capsule Take 100-200 mg by mouth every morning.  . gabapentin (NEURONTIN) 300 MG capsule   . glucose blood (ONETOUCH VERIO) test strip Check blood sugar twice daily  . HYDROcodone-acetaminophen (NORCO/VICODIN) 5-325 MG tablet Take 1 tablet by mouth every 6 (six) hours as needed.  . Lactobacillus (PROBIOTIC ACIDOPHILUS PO) Take by mouth.  . meloxicam (MOBIC) 15 MG tablet Take 1 tablet by mouth as needed.  . metFORMIN (GLUCOPHAGE-XR) 500 MG 24 hr  tablet Take 1 tablet (500 mg total) by mouth daily with breakfast. REPEAT LABS ARE DUE NOW  . mirabegron ER (MYRBETRIQ) 50 MG TB24 tablet Take 1 tablet (50 mg total) by mouth daily.  . Multiple Vitamins-Minerals (MULTIVITAMIN) LIQD Take by mouth. Vemma  . ONETOUCH DELICA LANCETS FINE MISC Check blood sugar twice daily  . polyethylene glycol (MIRALAX / GLYCOLAX) packet Take 17 g by mouth daily.  . pseudoephedrine (SUDAFED) 60 MG tablet Take 60 mg by mouth every 4 (four) hours as needed for congestion.  . [DISCONTINUED] cyclobenzaprine (FLEXERIL) 10 MG tablet TAKE 1 TABLET BY MOUTH AS NEEDED FOR HIP CONCERNS  . [DISCONTINUED] predniSONE (DELTASONE) 10 MG tablet 4 tabs for 2 days, then 3 tabs for 2 days, 2 tabs for 2 days, then 1 tab for 2 days, then stop  . [DISCONTINUED] predniSONE (DELTASONE) 10 MG tablet Take 4 tabs X 2 DAYS, 3 tabs  X 2 DAYS, 2 tabs X 2 DAYS, 1 tab X 2 DAYS.  . [DISCONTINUED] traMADol (ULTRAM) 50 MG tablet Take 1 tablet by mouth every 6 (six) hours as needed.   No facility-administered encounter medications on file as of 11/24/2015.    Activities of Daily Living In your present state of health, do you have any difficulty performing the following activities: 06/12/2015  Hearing? N  Vision? N  Difficulty concentrating or making decisions? N  Walking or climbing stairs? N  Dressing or bathing? N  Doing errands, shopping? N    Patient Care Team: [name], DO as PCP - General (Family Medicine) Deneise Lever, MD as Consulting Physician (Pulmonary Disease) Harle Battiest, MD as Consulting Physician (Obstetrics and Gynecology) Magnus Sinning, MD as Consulting Physician Erline Levine, MD as Consulting Physician (Neurosurgery)    Assessment:    CPE Exercise Activities and Dietary recommendations Current Exercise Habits: Structured exercise class, Type of exercise: strength training/weights (nustep), Time (Minutes): 20, Frequency (Times/Week): 3, Weekly Exercise  (Minutes/Week): 60, Intensity: Moderate, Exercise limited by: None identified  Goals    None     Fall Risk Fall Risk  11/24/2015 06/12/2015 06/02/2014 06/02/2014 05/29/2013  Falls in the past year? No No No Yes No  Number falls in past yr: - - - 1 -  Injury with Fall? - - - No -   Depression Screen PHQ 2/9 Scores 11/24/2015 06/12/2015 06/02/2014 06/02/2014  PHQ - 2 Score 0 0 0 0     Cognitive Testing mmse 30/30  Immunization History  Administered Date(s) Administered  . Influenza Split 03/15/2012  . Influenza Whole 02/27/2009  . Influenza, High Dose Seasonal PF 03/19/2013  . Influenza,inj,Quad PF,36+ Mos 03/13/2014, 02/16/2015  . Pneumococcal Conjugate-13 05/16/2013  . Pneumococcal Polysaccharide-23 09/29/2014  . Pneumococcal-Unspecified 05/16/2013  . Tdap 10/28/2010  . Zoster 05/12/2014   Screening Tests Health Maintenance  Topic Date Due  . COLONOSCOPY  05/30/2010  . MAMMOGRAM  10/29/2014  . HEMOGLOBIN A1C  12/01/2014  . URINE MICROALBUMIN  06/03/2015  . OPHTHALMOLOGY EXAM  07/01/2015  . PNA vac Low Risk Adult (2 of 2 - PCV13) 11/23/2016 (Originally 09/29/2015)  . INFLUENZA VACCINE  12/29/2015  . FOOT EXAM  11/23/2016  . TETANUS/TDAP  10/27/2020  . DEXA SCAN  Completed  . ZOSTAVAX  Completed      Plan:     See AVS During the course of the visit the patient was educated and counseled about the following appropriate screening and preventive services:   Vaccines to include Pneumoccal, Influenza, Hepatitis B, Td, Zostavax, HCV  Electrocardiogram  Cardiovascular Disease  Colorectal cancer screening  Bone density screening  Diabetes screening  Glaucoma screening  Mammography/PAP  Nutrition counseling   Patient Instructions (the written plan) was given to the patient.  1. Scoliosis   - cyclobenzaprine (FLEXERIL) 10 MG tablet; TAKE 1 TABLET BY MOUTH AS NEEDED FOR HIP CONCERNS  Dispense: 30 tablet; Refill: 0  2. DM (diabetes mellitus) type II uncontrolled,  periph vascular disorder (HCC) Check labs - Comprehensive metabolic panel - CBC with Differential/Platelet - Lipid panel - POCT urinalysis dipstick - Hemoglobin A1c - Microalbumin / creatinine urine ratio  3. Preventative health care See above  4. Estrogen deficiency   - DG Bone Density; Future  5. Breast cancer screening   - MM DIGITAL SCREENING BILATERAL; Future  [place]son, DO  11/25/2015

## 2015-11-24 NOTE — Patient Instructions (Signed)
Preventive Care for Adults, Female A healthy lifestyle and preventive care can promote health and wellness. Preventive health guidelines for women include the following key practices.  A routine yearly physical is a good way to check with your health care provider about your health and preventive screening. It is a chance to share any concerns and updates on your health and to receive a thorough exam.  Visit your dentist for a routine exam and preventive care every 6 months. Brush your teeth twice a day and floss once a day. Good oral hygiene prevents tooth decay and gum disease.  The frequency of eye exams is based on your age, health, family medical history, use of contact lenses, and other factors. Follow your health care provider's recommendations for frequency of eye exams.  Eat a healthy diet. Foods like vegetables, fruits, whole grains, low-fat dairy products, and lean protein foods contain the nutrients you need without too many calories. Decrease your intake of foods high in solid fats, added sugars, and salt. Eat the right amount of calories for you.Get information about a proper diet from your health care provider, if necessary.  Regular physical exercise is one of the most important things you can do for your health. Most adults should get at least 150 minutes of moderate-intensity exercise (any activity that increases your heart rate and causes you to sweat) each week. In addition, most adults need muscle-strengthening exercises on 2 or more days a week.  Maintain a healthy weight. The body mass index (BMI) is a screening tool to identify possible weight problems. It provides an estimate of body fat based on height and weight. Your health care provider can find your BMI and can help you achieve or maintain a healthy weight.For adults 20 years and older:  A BMI below 18.5 is considered underweight.  A BMI of 18.5 to 24.9 is normal.  A BMI of 25 to 29.9 is considered overweight.  A  BMI of 30 and above is considered obese.  Maintain normal blood lipids and cholesterol levels by exercising and minimizing your intake of saturated fat. Eat a balanced diet with plenty of fruit and vegetables. Blood tests for lipids and cholesterol should begin at age 45 and be repeated every 5 years. If your lipid or cholesterol levels are high, you are over 50, or you are at high risk for heart disease, you may need your cholesterol levels checked more frequently.Ongoing high lipid and cholesterol levels should be treated with medicines if diet and exercise are not working.  If you smoke, find out from your health care provider how to quit. If you do not use tobacco, do not start.  Lung cancer screening is recommended for adults aged 45-80 years who are at high risk for developing lung cancer because of a history of smoking. A yearly low-dose CT scan of the lungs is recommended for people who have at least a 30-pack-year history of smoking and are a current smoker or have quit within the past 15 years. A pack year of smoking is smoking an average of 1 pack of cigarettes a day for 1 year (for example: 1 pack a day for 30 years or 2 packs a day for 15 years). Yearly screening should continue until the smoker has stopped smoking for at least 15 years. Yearly screening should be stopped for people who develop a health problem that would prevent them from having lung cancer treatment.  If you are pregnant, do not drink alcohol. If you are  breastfeeding, be very cautious about drinking alcohol. If you are not pregnant and choose to drink alcohol, do not have more than 1 drink per day. One drink is considered to be 12 ounces (355 mL) of beer, 5 ounces (148 mL) of wine, or 1.5 ounces (44 mL) of liquor.  Avoid use of street drugs. Do not share needles with anyone. Ask for help if you need support or instructions about stopping the use of drugs.  High blood pressure causes heart disease and increases the risk  of stroke. Your blood pressure should be checked at least every 1 to 2 years. Ongoing high blood pressure should be treated with medicines if weight loss and exercise do not work.  If you are 55-79 years old, ask your health care provider if you should take aspirin to prevent strokes.  Diabetes screening is done by taking a blood sample to check your blood glucose level after you have not eaten for a certain period of time (fasting). If you are not overweight and you do not have risk factors for diabetes, you should be screened once every 3 years starting at age 45. If you are overweight or obese and you are 40-70 years of age, you should be screened for diabetes every year as part of your cardiovascular risk assessment.  Breast cancer screening is essential preventive care for women. You should practice "breast self-awareness." This means understanding the normal appearance and feel of your breasts and may include breast self-examination. Any changes detected, no matter how small, should be reported to a health care provider. Women in their 20s and 30s should have a clinical breast exam (CBE) by a health care provider as part of a regular health exam every 1 to 3 years. After age 40, women should have a CBE every year. Starting at age 40, women should consider having a mammogram (breast X-ray test) every year. Women who have a family history of breast cancer should talk to their health care provider about genetic screening. Women at a high risk of breast cancer should talk to their health care providers about having an MRI and a mammogram every year.  Breast cancer gene (BRCA)-related cancer risk assessment is recommended for women who have family members with BRCA-related cancers. BRCA-related cancers include breast, ovarian, tubal, and peritoneal cancers. Having family members with these cancers may be associated with an increased risk for harmful changes (mutations) in the breast cancer genes BRCA1 and  BRCA2. Results of the assessment will determine the need for genetic counseling and BRCA1 and BRCA2 testing.  Your health care provider may recommend that you be screened regularly for cancer of the pelvic organs (ovaries, uterus, and vagina). This screening involves a pelvic examination, including checking for microscopic changes to the surface of your cervix (Pap test). You may be encouraged to have this screening done every 3 years, beginning at age 21.  For women ages 30-65, health care providers may recommend pelvic exams and Pap testing every 3 years, or they may recommend the Pap and pelvic exam, combined with testing for human papilloma virus (HPV), every 5 years. Some types of HPV increase your risk of cervical cancer. Testing for HPV may also be done on women of any age with unclear Pap test results.  Other health care providers may not recommend any screening for nonpregnant women who are considered low risk for pelvic cancer and who do not have symptoms. Ask your health care provider if a screening pelvic exam is right for   you.  If you have had past treatment for cervical cancer or a condition that could lead to cancer, you need Pap tests and screening for cancer for at least 20 years after your treatment. If Pap tests have been discontinued, your risk factors (such as having a new sexual partner) need to be reassessed to determine if screening should resume. Some women have medical problems that increase the chance of getting cervical cancer. In these cases, your health care provider may recommend more frequent screening and Pap tests.  Colorectal cancer can be detected and often prevented. Most routine colorectal cancer screening begins at the age of 50 years and continues through age 75 years. However, your health care provider may recommend screening at an earlier age if you have risk factors for colon cancer. On a yearly basis, your health care provider may provide home test kits to check  for hidden blood in the stool. Use of a small camera at the end of a tube, to directly examine the colon (sigmoidoscopy or colonoscopy), can detect the earliest forms of colorectal cancer. Talk to your health care provider about this at age 50, when routine screening begins. Direct exam of the colon should be repeated every 5-10 years through age 75 years, unless early forms of precancerous polyps or small growths are found.  People who are at an increased risk for hepatitis B should be screened for this virus. You are considered at high risk for hepatitis B if:  You were born in a country where hepatitis B occurs often. Talk with your health care provider about which countries are considered high risk.  Your parents were born in a high-risk country and you have not received a shot to protect against hepatitis B (hepatitis B vaccine).  You have HIV or AIDS.  You use needles to inject street drugs.  You live with, or have sex with, someone who has hepatitis B.  You get hemodialysis treatment.  You take certain medicines for conditions like cancer, organ transplantation, and autoimmune conditions.  Hepatitis C blood testing is recommended for all people born from 1945 through 1965 and any individual with known risks for hepatitis C.  Practice safe sex. Use condoms and avoid high-risk sexual practices to reduce the spread of sexually transmitted infections (STIs). STIs include gonorrhea, chlamydia, syphilis, trichomonas, herpes, HPV, and human immunodeficiency virus (HIV). Herpes, HIV, and HPV are viral illnesses that have no cure. They can result in disability, cancer, and death.  You should be screened for sexually transmitted illnesses (STIs) including gonorrhea and chlamydia if:  You are sexually active and are younger than 24 years.  You are older than 24 years and your health care provider tells you that you are at risk for this type of infection.  Your sexual activity has changed  since you were last screened and you are at an increased risk for chlamydia or gonorrhea. Ask your health care provider if you are at risk.  If you are at risk of being infected with HIV, it is recommended that you take a prescription medicine daily to prevent HIV infection. This is called preexposure prophylaxis (PrEP). You are considered at risk if:  You are sexually active and do not regularly use condoms or know the HIV status of your partner(s).  You take drugs by injection.  You are sexually active with a partner who has HIV.  Talk with your health care provider about whether you are at high risk of being infected with HIV. If   you choose to begin PrEP, you should first be tested for HIV. You should then be tested every 3 months for as long as you are taking PrEP.  Osteoporosis is a disease in which the bones lose minerals and strength with aging. This can result in serious bone fractures or breaks. The risk of osteoporosis can be identified using a bone density scan. Women ages 67 years and over and women at risk for fractures or osteoporosis should discuss screening with their health care providers. Ask your health care provider whether you should take a calcium supplement or vitamin D to reduce the rate of osteoporosis.  Menopause can be associated with physical symptoms and risks. Hormone replacement therapy is available to decrease symptoms and risks. You should talk to your health care provider about whether hormone replacement therapy is right for you.  Use sunscreen. Apply sunscreen liberally and repeatedly throughout the day. You should seek shade when your shadow is shorter than you. Protect yourself by wearing long sleeves, pants, a wide-brimmed hat, and sunglasses year round, whenever you are outdoors.  Once a month, do a whole body skin exam, using a mirror to look at the skin on your back. Tell your health care provider of new moles, moles that have irregular borders, moles that  are larger than a pencil eraser, or moles that have changed in shape or color.  Stay current with required vaccines (immunizations).  Influenza vaccine. All adults should be immunized every year.  Tetanus, diphtheria, and acellular pertussis (Td, Tdap) vaccine. Pregnant women should receive 1 dose of Tdap vaccine during each pregnancy. The dose should be obtained regardless of the length of time since the last dose. Immunization is preferred during the 27th-36th week of gestation. An adult who has not previously received Tdap or who does not know her vaccine status should receive 1 dose of Tdap. This initial dose should be followed by tetanus and diphtheria toxoids (Td) booster doses every 10 years. Adults with an unknown or incomplete history of completing a 3-dose immunization series with Td-containing vaccines should begin or complete a primary immunization series including a Tdap dose. Adults should receive a Td booster every 10 years.  Varicella vaccine. An adult without evidence of immunity to varicella should receive 2 doses or a second dose if she has previously received 1 dose. Pregnant females who do not have evidence of immunity should receive the first dose after pregnancy. This first dose should be obtained before leaving the health care facility. The second dose should be obtained 4-8 weeks after the first dose.  Human papillomavirus (HPV) vaccine. Females aged 13-26 years who have not received the vaccine previously should obtain the 3-dose series. The vaccine is not recommended for use in pregnant females. However, pregnancy testing is not needed before receiving a dose. If a female is found to be pregnant after receiving a dose, no treatment is needed. In that case, the remaining doses should be delayed until after the pregnancy. Immunization is recommended for any person with an immunocompromised condition through the age of 61 years if she did not get any or all doses earlier. During the  3-dose series, the second dose should be obtained 4-8 weeks after the first dose. The third dose should be obtained 24 weeks after the first dose and 16 weeks after the second dose.  Zoster vaccine. One dose is recommended for adults aged 30 years or older unless certain conditions are present.  Measles, mumps, and rubella (MMR) vaccine. Adults born  before 1957 generally are considered immune to measles and mumps. Adults born in 1957 or later should have 1 or more doses of MMR vaccine unless there is a contraindication to the vaccine or there is laboratory evidence of immunity to each of the three diseases. A routine second dose of MMR vaccine should be obtained at least 28 days after the first dose for students attending postsecondary schools, health care workers, or international travelers. People who received inactivated measles vaccine or an unknown type of measles vaccine during 1963-1967 should receive 2 doses of MMR vaccine. People who received inactivated mumps vaccine or an unknown type of mumps vaccine before 1979 and are at high risk for mumps infection should consider immunization with 2 doses of MMR vaccine. For females of childbearing age, rubella immunity should be determined. If there is no evidence of immunity, females who are not pregnant should be vaccinated. If there is no evidence of immunity, females who are pregnant should delay immunization until after pregnancy. Unvaccinated health care workers born before 1957 who lack laboratory evidence of measles, mumps, or rubella immunity or laboratory confirmation of disease should consider measles and mumps immunization with 2 doses of MMR vaccine or rubella immunization with 1 dose of MMR vaccine.  Pneumococcal 13-valent conjugate (PCV13) vaccine. When indicated, a person who is uncertain of his immunization history and has no record of immunization should receive the PCV13 vaccine. All adults 65 years of age and older should receive this  vaccine. An adult aged 19 years or older who has certain medical conditions and has not been previously immunized should receive 1 dose of PCV13 vaccine. This PCV13 should be followed with a dose of pneumococcal polysaccharide (PPSV23) vaccine. Adults who are at high risk for pneumococcal disease should obtain the PPSV23 vaccine at least 8 weeks after the dose of PCV13 vaccine. Adults older than 75 years of age who have normal immune system function should obtain the PPSV23 vaccine dose at least 1 year after the dose of PCV13 vaccine.  Pneumococcal polysaccharide (PPSV23) vaccine. When PCV13 is also indicated, PCV13 should be obtained first. All adults aged 65 years and older should be immunized. An adult younger than age 65 years who has certain medical conditions should be immunized. Any person who resides in a nursing home or long-term care facility should be immunized. An adult smoker should be immunized. People with an immunocompromised condition and certain other conditions should receive both PCV13 and PPSV23 vaccines. People with human immunodeficiency virus (HIV) infection should be immunized as soon as possible after diagnosis. Immunization during chemotherapy or radiation therapy should be avoided. Routine use of PPSV23 vaccine is not recommended for American Indians, [place] Natives, or people younger than 65 years unless there are medical conditions that require PPSV23 vaccine. When indicated, people who have unknown immunization and have no record of immunization should receive PPSV23 vaccine. One-time revaccination 5 years after the first dose of PPSV23 is recommended for people aged 19-64 years who have chronic kidney failure, nephrotic syndrome, asplenia, or immunocompromised conditions. People who received 1-2 doses of PPSV23 before age 65 years should receive another dose of PPSV23 vaccine at age 65 years or later if at least 5 years have passed since the previous dose. Doses of PPSV23 are not  needed for people immunized with PPSV23 at or after age 65 years.  Meningococcal vaccine. Adults with asplenia or persistent complement component deficiencies should receive 2 doses of quadrivalent meningococcal conjugate (MenACWY-D) vaccine. The doses should be obtained   at least 2 months apart. Microbiologists working with certain meningococcal bacteria, [place] recruits, people at risk during an outbreak, and people who travel to or live in countries with a high rate of meningitis should be immunized. A first-year college student up through age 34 years who is living in a residence hall should receive a dose if she did not receive a dose on or after her 16th birthday. Adults who have certain high-risk conditions should receive one or more doses of vaccine.  Hepatitis A vaccine. Adults who wish to be protected from this disease, have certain high-risk conditions, work with hepatitis A-infected animals, work in hepatitis A research labs, or travel to or work in countries with a high rate of hepatitis A should be immunized. Adults who were previously unvaccinated and who anticipate close contact with an international adoptee during the first 60 days after arrival in the [place] from a country with a high rate of hepatitis A should be immunized.  Hepatitis B vaccine. Adults who wish to be protected from this disease, have certain high-risk conditions, may be exposed to blood or other infectious body fluids, are household contacts or sex partners of hepatitis B positive people, are clients or workers in certain care facilities, or travel to or work in countries with a high rate of hepatitis B should be immunized.  Haemophilus influenzae type b (Hib) vaccine. A previously unvaccinated person with asplenia or sickle cell disease or having a scheduled splenectomy should receive 1 dose of Hib vaccine. Regardless of previous immunization, a recipient of a hematopoietic stem cell transplant should receive a  3-dose series 6-12 months after her successful transplant. Hib vaccine is not recommended for adults with HIV infection. Preventive Services / Frequency Ages 35 to 4 years  Blood pressure check.** / Every 3-5 years.  Lipid and cholesterol check.** / Every 5 years beginning at age 60.  Clinical breast exam.** / Every 3 years for women in their 71s and 10s.  BRCA-related cancer risk assessment.** / For women who have family members with a BRCA-related cancer (breast, ovarian, tubal, or peritoneal cancers).  Pap test.** / Every 2 years from ages 76 through 26. Every 3 years starting at age 61 through age 76 or 93 with a history of 3 consecutive normal Pap tests.  HPV screening.** / Every 3 years from ages 37 through ages 60 to 51 with a history of 3 consecutive normal Pap tests.  Hepatitis C blood test.** / For any individual with known risks for hepatitis C.  Skin self-exam. / Monthly.  Influenza vaccine. / Every year.  Tetanus, diphtheria, and acellular pertussis (Tdap, Td) vaccine.** / Consult your health care provider. Pregnant women should receive 1 dose of Tdap vaccine during each pregnancy. 1 dose of Td every 10 years.  Varicella vaccine.** / Consult your health care provider. Pregnant females who do not have evidence of immunity should receive the first dose after pregnancy.  HPV vaccine. / 3 doses over 6 months, if 93 and younger. The vaccine is not recommended for use in pregnant females. However, pregnancy testing is not needed before receiving a dose.  Measles, mumps, rubella (MMR) vaccine.** / You need at least 1 dose of MMR if you were born in 1957 or later. You may also need a 2nd dose. For females of childbearing age, rubella immunity should be determined. If there is no evidence of immunity, females who are not pregnant should be vaccinated. If there is no evidence of immunity, females who are  pregnant should delay immunization until after pregnancy.  Pneumococcal  13-valent conjugate (PCV13) vaccine.** / Consult your health care provider.  Pneumococcal polysaccharide (PPSV23) vaccine.** / 1 to 2 doses if you smoke cigarettes or if you have certain conditions.  Meningococcal vaccine.** / 1 dose if you are age 68 to 8 years and a Market researcher living in a residence hall, or have one of several medical conditions, you need to get vaccinated against meningococcal disease. You may also need additional booster doses.  Hepatitis A vaccine.** / Consult your health care provider.  Hepatitis B vaccine.** / Consult your health care provider.  Haemophilus influenzae type b (Hib) vaccine.** / Consult your health care provider. Ages 7 to 53 years  Blood pressure check.** / Every year.  Lipid and cholesterol check.** / Every 5 years beginning at age 25 years.  Lung cancer screening. / Every year if you are aged 11-80 years and have a 30-pack-year history of smoking and currently smoke or have quit within the past 15 years. Yearly screening is stopped once you have quit smoking for at least 15 years or develop a health problem that would prevent you from having lung cancer treatment.  Clinical breast exam.** / Every year after age 48 years.  BRCA-related cancer risk assessment.** / For women who have family members with a BRCA-related cancer (breast, ovarian, tubal, or peritoneal cancers).  Mammogram.** / Every year beginning at age 41 years and continuing for as long as you are in good health. Consult with your health care provider.  Pap test.** / Every 3 years starting at age 65 years through age 37 or 70 years with a history of 3 consecutive normal Pap tests.  HPV screening.** / Every 3 years from ages 72 years through ages 60 to 40 years with a history of 3 consecutive normal Pap tests.  Fecal occult blood test (FOBT) of stool. / Every year beginning at age 21 years and continuing until age 5 years. You may not need to do this test if you get  a colonoscopy every 10 years.  Flexible sigmoidoscopy or colonoscopy.** / Every 5 years for a flexible sigmoidoscopy or every 10 years for a colonoscopy beginning at age 35 years and continuing until age 48 years.  Hepatitis C blood test.** / For all people born from [birth year] through 1965 and any individual with known risks for hepatitis C.  Skin self-exam. / Monthly.  Influenza vaccine. / Every year.  Tetanus, diphtheria, and acellular pertussis (Tdap/Td) vaccine.** / Consult your health care provider. Pregnant women should receive 1 dose of Tdap vaccine during each pregnancy. 1 dose of Td every 10 years.  Varicella vaccine.** / Consult your health care provider. Pregnant females who do not have evidence of immunity should receive the first dose after pregnancy.  Zoster vaccine.** / 1 dose for adults aged 30 years or older.  Measles, mumps, rubella (MMR) vaccine.** / You need at least 1 dose of MMR if you were born in 1957 or later. You may also need a second dose. For females of childbearing age, rubella immunity should be determined. If there is no evidence of immunity, females who are not pregnant should be vaccinated. If there is no evidence of immunity, females who are pregnant should delay immunization until after pregnancy.  Pneumococcal 13-valent conjugate (PCV13) vaccine.** / Consult your health care provider.  Pneumococcal polysaccharide (PPSV23) vaccine.** / 1 to 2 doses if you smoke cigarettes or if you have certain conditions.  Meningococcal vaccine.** /  Consult your health care provider.  Hepatitis A vaccine.** / Consult your health care provider.  Hepatitis B vaccine.** / Consult your health care provider.  Haemophilus influenzae type b (Hib) vaccine.** / Consult your health care provider. Ages 64 years and over  Blood pressure check.** / Every year.  Lipid and cholesterol check.** / Every 5 years beginning at age 23 years.  Lung cancer screening. / Every year if you  are aged 16-80 years and have a 30-pack-year history of smoking and currently smoke or have quit within the past 15 years. Yearly screening is stopped once you have quit smoking for at least 15 years or develop a health problem that would prevent you from having lung cancer treatment.  Clinical breast exam.** / Every year after age 74 years.  BRCA-related cancer risk assessment.** / For women who have family members with a BRCA-related cancer (breast, ovarian, tubal, or peritoneal cancers).  Mammogram.** / Every year beginning at age 44 years and continuing for as long as you are in good health. Consult with your health care provider.  Pap test.** / Every 3 years starting at age 58 years through age 22 or 39 years with 3 consecutive normal Pap tests. Testing can be stopped between 65 and 70 years with 3 consecutive normal Pap tests and no abnormal Pap or HPV tests in the past 10 years.  HPV screening.** / Every 3 years from ages 64 years through ages 70 or 61 years with a history of 3 consecutive normal Pap tests. Testing can be stopped between 65 and 70 years with 3 consecutive normal Pap tests and no abnormal Pap or HPV tests in the past 10 years.  Fecal occult blood test (FOBT) of stool. / Every year beginning at age 40 years and continuing until age 27 years. You may not need to do this test if you get a colonoscopy every 10 years.  Flexible sigmoidoscopy or colonoscopy.** / Every 5 years for a flexible sigmoidoscopy or every 10 years for a colonoscopy beginning at age 7 years and continuing until age 32 years.  Hepatitis C blood test.** / For all people born from [birth year] through 1965 and any individual with known risks for hepatitis C.  Osteoporosis screening.** / A one-time screening for women ages 30 years and over and women at risk for fractures or osteoporosis.  Skin self-exam. / Monthly.  Influenza vaccine. / Every year.  Tetanus, diphtheria, and acellular pertussis (Tdap/Td)  vaccine.** / 1 dose of Td every 10 years.  Varicella vaccine.** / Consult your health care provider.  Zoster vaccine.** / 1 dose for adults aged 35 years or older.  Pneumococcal 13-valent conjugate (PCV13) vaccine.** / Consult your health care provider.  Pneumococcal polysaccharide (PPSV23) vaccine.** / 1 dose for all adults aged 46 years and older.  Meningococcal vaccine.** / Consult your health care provider.  Hepatitis A vaccine.** / Consult your health care provider.  Hepatitis B vaccine.** / Consult your health care provider.  Haemophilus influenzae type b (Hib) vaccine.** / Consult your health care provider. ** Family history and personal history of risk and conditions may change your health care provider's recommendations.   This information is not intended to replace advice given to you by your health care provider. Make sure you discuss any questions you have with your health care provider.   Document Released: 07/12/2001 Document Revised: 06/06/2014 Document Reviewed: 10/11/2010 Elsevier Interactive Patient Education [employer].

## 2015-11-25 LAB — CBC WITH DIFFERENTIAL/PLATELET
Basophils Absolute: 0.1 10*3/uL (ref 0.0–0.1)
Basophils Relative: 0.9 % (ref 0.0–3.0)
Eosinophils Absolute: 0.1 10*3/uL (ref 0.0–0.7)
Eosinophils Relative: 0.9 % (ref 0.0–5.0)
HCT: 37.7 % (ref 36.0–46.0)
Hemoglobin: 12.4 g/dL (ref 12.0–15.0)
Lymphocytes Relative: 26 % (ref 12.0–46.0)
Lymphs Abs: 2.1 10*3/uL (ref 0.7–4.0)
MCHC: 32.9 g/dL (ref 30.0–36.0)
MCV: 86.3 fl (ref 78.0–100.0)
Monocytes Absolute: 0.6 10*3/uL (ref 0.1–1.0)
Monocytes Relative: 7.2 % (ref 3.0–12.0)
Neutro Abs: 5.3 10*3/uL (ref 1.4–7.7)
Neutrophils Relative %: 65 % (ref 43.0–77.0)
Platelets: 257 10*3/uL (ref 150.0–400.0)
RBC: 4.37 Mil/uL (ref 3.87–5.11)
RDW: 14.6 % (ref 11.5–15.5)
WBC: 8.2 10*3/uL (ref 4.0–10.5)

## 2015-11-25 LAB — LIPID PANEL
Cholesterol: 229 mg/dL — ABNORMAL HIGH (ref 0–200)
HDL: 57 mg/dL (ref >39.00)
LDL Cholesterol: 143 mg/dL — ABNORMAL HIGH (ref 0–99)
NonHDL: 172.3
Total CHOL/HDL Ratio: 4
Triglycerides: 146 mg/dL (ref 0.0–149.0)
VLDL: 29.2 mg/dL (ref 0.0–40.0)

## 2015-11-25 LAB — HEMOGLOBIN A1C: Hgb A1c MFr Bld: 6.5 % (ref 4.6–6.5)

## 2015-11-25 LAB — COMPREHENSIVE METABOLIC PANEL
ALBUMIN: 4.1 g/dL (ref 3.5–5.2)
ALK PHOS: 76 U/L (ref 39–117)
ALT: 17 U/L (ref 0–35)
AST: 18 U/L (ref 0–37)
BILIRUBIN TOTAL: 0.5 mg/dL (ref 0.2–1.2)
BUN: 17 mg/dL (ref 6–23)
CO2: 28 mEq/L (ref 19–32)
CREATININE: 0.88 mg/dL (ref 0.40–1.20)
Calcium: 9.7 mg/dL (ref 8.4–10.5)
Chloride: 104 mEq/L (ref 96–112)
GFR: 66.61 mL/min (ref >60.00)
Glucose, Bld: 104 mg/dL — ABNORMAL HIGH (ref 70–99)
POTASSIUM: 3.9 meq/L (ref 3.5–5.1)
SODIUM: 139 meq/L (ref 135–145)
TOTAL PROTEIN: 7.3 g/dL (ref 6.0–8.3)

## 2015-11-25 LAB — MICROALBUMIN / CREATININE URINE RATIO
Creatinine,U: 117.3 mg/dL
Microalb Creat Ratio: 0.6 mg/g (ref 0.0–30.0)

## 2015-11-26 ENCOUNTER — Telehealth: Payer: Self-pay

## 2015-11-26 NOTE — Telephone Encounter (Signed)
Order via the [facility]

## 2015-11-26 NOTE — Telephone Encounter (Signed)
-----   Message from [name], DO sent at 11/24/2015  3:08 PM EDT ----- Pt would like cologuard

## 2015-12-02 ENCOUNTER — Other Ambulatory Visit: Payer: Self-pay | Admitting: Family Medicine

## 2015-12-21 ENCOUNTER — Ambulatory Visit (HOSPITAL_BASED_OUTPATIENT_CLINIC_OR_DEPARTMENT_OTHER)
Admission: RE | Admit: 2015-12-21 | Discharge: 2015-12-21 | Disposition: A | Discharge status: Home / Self Care | Payer: Medicare Other | Source: Ambulatory Visit | Attending: Family Medicine | Admitting: Family Medicine

## 2015-12-21 DIAGNOSIS — Z780 Asymptomatic menopausal state: Secondary | ICD-10-CM | POA: Diagnosis not present

## 2015-12-21 DIAGNOSIS — E119 Type 2 diabetes mellitus without complications: Secondary | ICD-10-CM | POA: Diagnosis not present

## 2015-12-21 DIAGNOSIS — Z1239 Encounter for other screening for malignant neoplasm of breast: Secondary | ICD-10-CM

## 2015-12-21 DIAGNOSIS — Z1231 Encounter for screening mammogram for malignant neoplasm of breast: Secondary | ICD-10-CM | POA: Diagnosis not present

## 2015-12-21 DIAGNOSIS — E2839 Other primary ovarian failure: Secondary | ICD-10-CM | POA: Diagnosis not present

## 2015-12-21 DIAGNOSIS — M8588 Other specified disorders of bone density and structure, other site: Secondary | ICD-10-CM | POA: Insufficient documentation

## 2015-12-25 ENCOUNTER — Other Ambulatory Visit: Payer: Self-pay

## 2015-12-25 MED ORDER — ALENDRONATE SODIUM 70 MG PO TABS: 70.0000 mg | ORAL_TABLET | ORAL | 11 refills | Status: DC

## 2015-12-28 ENCOUNTER — Other Ambulatory Visit: Payer: Self-pay | Admitting: Family Medicine

## 2015-12-28 NOTE — Telephone Encounter (Signed)
Last seen and filled 11/24/15 #30   Please advise    KP

## 2016-01-12 DIAGNOSIS — M4806 Spinal stenosis, lumbar region: Secondary | ICD-10-CM | POA: Diagnosis not present

## 2016-02-09 DIAGNOSIS — M4120 Other idiopathic scoliosis, site unspecified: Secondary | ICD-10-CM | POA: Diagnosis not present

## 2016-02-09 DIAGNOSIS — M4806 Spinal stenosis, lumbar region: Secondary | ICD-10-CM | POA: Diagnosis not present

## 2016-02-17 DIAGNOSIS — D1801 Hemangioma of skin and subcutaneous tissue: Secondary | ICD-10-CM | POA: Diagnosis not present

## 2016-02-17 DIAGNOSIS — L821 Other seborrheic keratosis: Secondary | ICD-10-CM | POA: Diagnosis not present

## 2016-02-17 DIAGNOSIS — L565 Disseminated superficial actinic porokeratosis (DSAP): Secondary | ICD-10-CM | POA: Diagnosis not present

## 2016-03-21 ENCOUNTER — Ambulatory Visit (INDEPENDENT_AMBULATORY_CARE_PROVIDER_SITE_OTHER): Payer: Medicare Other

## 2016-03-21 DIAGNOSIS — Z23 Encounter for immunization: Secondary | ICD-10-CM | POA: Diagnosis not present

## 2016-03-28 ENCOUNTER — Other Ambulatory Visit (INDEPENDENT_AMBULATORY_CARE_PROVIDER_SITE_OTHER): Payer: Self-pay | Admitting: Physical Medicine and Rehabilitation

## 2016-04-06 ENCOUNTER — Other Ambulatory Visit (INDEPENDENT_AMBULATORY_CARE_PROVIDER_SITE_OTHER): Payer: Self-pay | Admitting: Orthopedic Surgery

## 2016-04-06 NOTE — Telephone Encounter (Signed)
Refill

## 2016-04-11 DIAGNOSIS — M5416 Radiculopathy, lumbar region: Secondary | ICD-10-CM | POA: Diagnosis not present

## 2016-04-11 DIAGNOSIS — M4120 Other idiopathic scoliosis, site unspecified: Secondary | ICD-10-CM | POA: Diagnosis not present

## 2016-04-11 DIAGNOSIS — M48062 Spinal stenosis, lumbar region with neurogenic claudication: Secondary | ICD-10-CM | POA: Diagnosis not present

## 2016-05-04 ENCOUNTER — Ambulatory Visit: Payer: Medicare Other | Admitting: Internal Medicine

## 2016-05-04 DIAGNOSIS — R030 Elevated blood-pressure reading, without diagnosis of hypertension: Secondary | ICD-10-CM | POA: Diagnosis not present

## 2016-05-04 DIAGNOSIS — M48062 Spinal stenosis, lumbar region with neurogenic claudication: Secondary | ICD-10-CM | POA: Diagnosis not present

## 2016-05-04 DIAGNOSIS — M4317 Spondylolisthesis, lumbosacral region: Secondary | ICD-10-CM | POA: Diagnosis not present

## 2016-05-04 DIAGNOSIS — M4120 Other idiopathic scoliosis, site unspecified: Secondary | ICD-10-CM | POA: Diagnosis not present

## 2016-05-12 ENCOUNTER — Other Ambulatory Visit: Payer: Self-pay | Admitting: Neurosurgery

## 2016-05-12 DIAGNOSIS — M4317 Spondylolisthesis, lumbosacral region: Secondary | ICD-10-CM

## 2016-05-21 ENCOUNTER — Ambulatory Visit
Admission: RE | Admit: 2016-05-21 | Discharge: 2016-05-21 | Disposition: A | Discharge status: Home / Self Care | Payer: Medicare Other | Source: Ambulatory Visit | Attending: Neurosurgery | Admitting: Neurosurgery

## 2016-05-21 DIAGNOSIS — M4317 Spondylolisthesis, lumbosacral region: Secondary | ICD-10-CM

## 2016-05-21 DIAGNOSIS — M48061 Spinal stenosis, lumbar region without neurogenic claudication: Secondary | ICD-10-CM | POA: Diagnosis not present

## 2016-05-24 ENCOUNTER — Telehealth: Payer: Self-pay | Admitting: Family Medicine

## 2016-05-24 ENCOUNTER — Telehealth: Payer: Self-pay | Admitting: *Deleted

## 2016-05-24 NOTE — Telephone Encounter (Signed)
Called patient and left message to return call

## 2016-05-24 NOTE — Telephone Encounter (Signed)
yes

## 2016-05-24 NOTE — Telephone Encounter (Signed)
Pt called In. She says that she need to reschedule appt that was set for 05/26/16 (FU).  Pt says that she also has an appt with her asthma dr at the same time. Not showing that provider has any openings soon. Could I use an acute slot for a FU for pt?

## 2016-05-25 ENCOUNTER — Encounter: Payer: Self-pay | Admitting: Internal Medicine

## 2016-05-25 ENCOUNTER — Ambulatory Visit (INDEPENDENT_AMBULATORY_CARE_PROVIDER_SITE_OTHER): Payer: Medicare Other | Admitting: Internal Medicine

## 2016-05-25 DIAGNOSIS — J3089 Other allergic rhinitis: Secondary | ICD-10-CM

## 2016-05-25 DIAGNOSIS — J4520 Mild intermittent asthma, uncomplicated: Secondary | ICD-10-CM | POA: Diagnosis not present

## 2016-05-25 DIAGNOSIS — J302 Other seasonal allergic rhinitis: Secondary | ICD-10-CM | POA: Diagnosis not present

## 2016-05-25 MED ORDER — ALBUTEROL SULFATE HFA 108 (90 BASE) MCG/ACT IN AERS: 2.0000 | INHALATION_SPRAY | Freq: Four times a day (QID) | RESPIRATORY_TRACT | 3 refills | Status: DC | PRN

## 2016-05-25 MED ORDER — FLUTICASONE-SALMETEROL 250-50 MCG/DOSE IN AEPB: INHALATION_SPRAY | RESPIRATORY_TRACT | 3 refills | Status: DC

## 2016-05-25 MED ORDER — AZELASTINE-FLUTICASONE 137-50 MCG/ACT NA SUSP: 2.0000 | Freq: Every day | NASAL | 0 refills | Status: DC

## 2016-05-25 NOTE — Assessment & Plan Note (Signed)
Currently good control. Medications discussed. Plan-refill Advair and her albuterol rescue inhaler

## 2016-05-25 NOTE — Assessment & Plan Note (Signed)
Mildly annoying postnasal drainage without sinusitis. Plan-try sample Dymista nasal spray

## 2016-05-25 NOTE — Progress Notes (Signed)
HPI female never smoker followed for allergic rhinitis, asthma, complicated by history embolic CVA complicating C-spine surgery, DM 2  ---------------------------------------------------------------------  05/05/2015-75 year old female never smoker followed for allergic rhinitis, asthma, complicated by history embolic CVA complicating C-spine surgery, DM 2 Allergy Vaccine DC'd FOLLOWS FOR: Pt states she is doing well without her allergy injections; slight "drip" in her nose but not bothersome. She is off allergy vaccine since mid summer and denies significant problems during the fall season. Had a bad chest cold 6 weeks ago without wheezing. Not using any antihistamines or nasal sprays. We reviewed medications for refill and noted they can be provided by her primary physician.  05/25/2016-75 year old female never smoker followed for allergic rhinitis, asthma, complicated by history embolic CVA complicating C-spine surgery, DM 2 Follow: per pt hacky cough with no mucus production with nasal drainage  Admits she has wheezed a little occasionally but rescue inhaler lasts all year. Continues Advair. Mild postnasal drip without headache, purulent discharge or active infection. Discussed antihistamines. Pending scoliosis spine surgery or this month  ROV-see HPI Constitutional:   No-   weight loss, night sweats, fevers, chills, fatigue, lassitude. HEENT:   No-  headaches, difficulty swallowing, tooth/dental problems, sore throat,       No- sneezing, itching, ear ache, +nasal congestion, post nasal drip,  CV:  chest pain, no-orthopnea, PND, swelling in lower extremities, anasarca, dizziness, palpitations Resp: No-   shortness of breath with exertion or at rest.              No-   productive cough,  No non-productive cough,  No- coughing up of blood.              No-   change in color of mucus.  No-recent wheezing.   Skin: No-   rash or lesions. GI:  GU:  MS:  No-   joint pain or swelling.  No-  decreased range of motion.   Neuro-     nothing unusual Psych:  No- change in mood or affect. No depression or anxiety.  No memory loss.  OBJ General- Alert, Oriented, Affect-appropriate, Distress- none acute, looks very well Skin- rash-none, lesions- none, excoriation- none Lymphadenopathy- none Head- atraumatic            Eyes- Gross vision intact, PERRLA, conjunctivae clear secretions            Ears- Hearing, canals-normal            Nose- Clear, no-Septal dev, mucus, polyps, erosion, perforation             Throat- Mallampati II , mucosa clear , drainage- none, tonsils- atrophic,  Neck- flexible , trachea midline, no stridor , thyroid nl, carotid no bruit Chest - symmetrical excursion , unlabored           Heart/CV- RRR , no murmur , no gallop  , no rub, nl s1 s2                           - JVD- none , edema- none, stasis changes- none, varices- none           Lung- clear to P&A, wheeze- none, cough-none , dullness-none, rub- none           Chest wall-  Abd-  Br/ Gen/ Rectal- Not done, not indicated Extrem- cyanosis- none, clubbing, none, atrophy- none, strength- nl Neuro- grossly intact to observation

## 2016-05-25 NOTE — Patient Instructions (Signed)
Sample Dymista nasal spray     Try 1-2 puffs each nostril once or twice daily as needed    Refill scripts sent to [facility]

## 2016-05-26 ENCOUNTER — Ambulatory Visit: Payer: Medicare Other | Admitting: Family Medicine

## 2016-05-27 NOTE — Telephone Encounter (Signed)
Called pt. lvm advising her to call me back to schedule her appt with PCP.

## 2016-06-03 ENCOUNTER — Ambulatory Visit (INDEPENDENT_AMBULATORY_CARE_PROVIDER_SITE_OTHER): Payer: Medicare Other | Admitting: Family Medicine

## 2016-06-03 ENCOUNTER — Encounter: Payer: Self-pay | Admitting: Family Medicine

## 2016-06-03 DIAGNOSIS — E118 Type 2 diabetes mellitus with unspecified complications: Secondary | ICD-10-CM | POA: Diagnosis not present

## 2016-06-03 DIAGNOSIS — E1165 Type 2 diabetes mellitus with hyperglycemia: Secondary | ICD-10-CM | POA: Insufficient documentation

## 2016-06-03 LAB — CBC
HEMATOCRIT: 34.6 % — AB (ref 36.0–46.0)
Hemoglobin: 12.1 g/dL (ref 12.0–15.0)
MCHC: 35.1 g/dL (ref 30.0–36.0)
MCV: 89.3 fl (ref 78.0–100.0)
Platelets: 240 10*3/uL (ref 150.0–400.0)
RBC: 3.88 Mil/uL (ref 3.87–5.11)
RDW: 14.7 % (ref 11.5–15.5)
WBC: 8.4 10*3/uL (ref 4.0–10.5)

## 2016-06-03 LAB — URINALYSIS
Bilirubin Urine: NEGATIVE
Hgb urine dipstick: NEGATIVE
Ketones, ur: NEGATIVE
LEUKOCYTES UA: NEGATIVE
NITRITE: NEGATIVE
PH: 5.5 (ref 5.0–8.0)
SPECIFIC GRAVITY, URINE: 1.015 (ref 1.000–1.030)
TOTAL PROTEIN, URINE-UPE24: NEGATIVE
Urine Glucose: NEGATIVE
Urobilinogen, UA: 0.2 (ref 0.0–1.0)

## 2016-06-03 LAB — TSH: TSH: 5.38 u[IU]/mL — AB (ref 0.35–4.50)

## 2016-06-03 LAB — COMPREHENSIVE METABOLIC PANEL
ALBUMIN: 3.8 g/dL (ref 3.5–5.2)
ALK PHOS: 60 U/L (ref 39–117)
ALT: 18 U/L (ref 0–35)
AST: 18 U/L (ref 0–37)
BILIRUBIN TOTAL: 0.3 mg/dL (ref 0.2–1.2)
BUN: 19 mg/dL (ref 6–23)
CO2: 27 mEq/L (ref 19–32)
Calcium: 9.3 mg/dL (ref 8.4–10.5)
Chloride: 105 mEq/L (ref 96–112)
Creatinine, Ser: 0.9 mg/dL (ref 0.40–1.20)
GFR: 64.82 mL/min (ref >60.00)
GLUCOSE: 95 mg/dL (ref 70–99)
Potassium: 3.7 mEq/L (ref 3.5–5.1)
SODIUM: 138 meq/L (ref 135–145)
TOTAL PROTEIN: 6.9 g/dL (ref 6.0–8.3)

## 2016-06-03 LAB — MICROALBUMIN / CREATININE URINE RATIO
Creatinine,U: 62 mg/dL
Microalb Creat Ratio: 1.1 mg/g (ref 0.0–30.0)
Microalb, Ur: <0.7 mg/dL (ref 0.0–1.9)

## 2016-06-03 LAB — HEMOGLOBIN A1C: HEMOGLOBIN A1C: 6.8 % — AB (ref 4.6–6.5)

## 2016-06-03 MED ORDER — ONETOUCH DELICA LANCETS FINE MISC: 3 refills | Status: DC

## 2016-06-03 MED ORDER — AZELASTINE-FLUTICASONE 137-50 MCG/ACT NA SUSP: 2.0000 | Freq: Every day | NASAL | 0 refills | Status: DC

## 2016-06-03 MED ORDER — GLUCOSE BLOOD VI STRP: ORAL_STRIP | 3 refills | Status: DC

## 2016-06-03 MED ORDER — METFORMIN HCL ER 500 MG PO TB24: 500.0000 mg | ORAL_TABLET | Freq: Every day | ORAL | 6 refills | Status: DC

## 2016-06-03 NOTE — Progress Notes (Signed)
Subjective:    Patient ID: [name], female    DOB: 1940/10/31, 76 y.o.   MRN: [id number]  Chief Complaint  Patient presents with  . Follow-up    6 month     HPI Patient is in today for 6 month follow up patient has no acute concerns  Past Medical History:  Diagnosis Date  . Arthritis   . Asthma    adult onset  . Basal cell cancer    LUE; Porokeratosis also  . Complication of anesthesia    small trachea  . Diabetes mellitus 2010   A1c 6.7%  . DVT (deep venous thrombosis) ([place]) 2006   post immobilization post cns surgery  . GERD (gastroesophageal reflux disease)    very mild  . Granulomatous lung disease ([place]) 2002   incidental Xray finding  . Hyperlipidemia   . Hypertension 2004   Hypertensive response on Stress Test  . Paralysis ([place]) 2006   post cervical fusion with spinal sac tear  with hematoma   . PTE (pulmonary thromboembolism) ([place]) 2006    Past Surgical History:  Procedure Laterality Date  . BUNIONECTOMY    . CERVICAL FUSION  2006   Dr Annette Stable, [place];post op hematoma & cns leak & urinary retention  . COLONOSCOPY  1992 & 2002   negative  . epidural steroids  2006   cervical spine  . ROTATOR CUFF REPAIR  2009   Right  . SEPTOPLASTY    . TONSILLECTOMY  76 years old  . TOTAL HIP ARTHROPLASTY  06/01/2012   Procedure: TOTAL HIP ARTHROPLASTY ANTERIOR APPROACH;  Surgeon: Mcarthur Rossetti, MD;  Location: [facility] ORS;  Service: Orthopedics;  Laterality: Left;  Left Total Hip Arthroplasty  . TUBAL LIGATION      Family History  Problem Relation Age of Onset  . COPD Father     emphysema  . Cancer Mother     cns cancer  . Diabetes Sister     TWIN sister ; also Fibromyalgia ; S/P stent 2004  . Heart disease Sister     stents @ 24 & 20  . Stroke Maternal Grandmother     in  late 72s  . Transient ischemic attack Paternal Aunt     Social History   Social History  . Marital status: Divorced    Spouse name: N/A  . Number of children: N/A  . Years of  education: N/A   Occupational History  . Not on file.   Social History Main Topics  . Smoking status: Never Smoker  . Smokeless tobacco: Never Used  . Alcohol use 0.0 oz/week     Comment: twice a week; 2-4/week  . Drug use: No  . Sexual activity: Not Currently   Other Topics Concern  . Not on file   Social History Narrative  . No narrative on file    Outpatient Medications Prior to Visit  Medication Sig Dispense Refill  . albuterol (PROAIR HFA) 108 (90 Base) MCG/ACT inhaler Inhale 2 puffs into the lungs every 6 (six) hours as needed for wheezing or shortness of breath. 3 Inhaler 3  . alendronate (FOSAMAX) 70 MG tablet Take 1 tablet (70 mg total) by mouth every 7 (seven) days. Take with a full glass of water on an empty stomach. 4 tablet 11  . aspirin 81 MG tablet Take 81 mg by mouth daily.    . calcium gluconate 500 MG tablet Take 600 mg by mouth 2 (two) times daily.     [name]  cetirizine (ZYRTEC) 10 MG tablet Take 10 mg by mouth daily.    . cholecalciferol (VITAMIN D) 1000 UNITS tablet Take 1,000 Units by mouth daily. 2 pills daily    . cyclobenzaprine (FLEXERIL) 10 MG tablet TAKE 1 TABLET BY MOUTH AS NEEDED FOR HIP CONCERNS 30 tablet 0  . docusate sodium (COLACE) 100 MG capsule Take 100-200 mg by mouth every morning.    . Fluticasone-Salmeterol (ADVAIR DISKUS) 250-50 MCG/DOSE AEPB Use 1 inhalation two times  daily ;rinse mouth 180 each 3  . gabapentin (NEURONTIN) 300 MG capsule   1  . HYDROcodone-acetaminophen (NORCO/VICODIN) 5-325 MG tablet Take 1 tablet by mouth every 6 (six) hours as needed.  0  . Lactobacillus (PROBIOTIC ACIDOPHILUS PO) Take by mouth.    . meloxicam (MOBIC) 15 MG tablet TAKE 1 TABLET BY MOUTH EVERY DAY WITH FOOD AS NEEDED 30 tablet 0  . mirabegron ER (MYRBETRIQ) 50 MG TB24 tablet Take 1 tablet (50 mg total) by mouth daily. 30 tablet 5  . Multiple Vitamins-Minerals (MULTIVITAMIN) LIQD Take by mouth. Vemma    . polyethylene glycol (MIRALAX / GLYCOLAX) packet Take  17 g by mouth daily.    . pseudoephedrine (SUDAFED) 60 MG tablet Take 60 mg by mouth every 4 (four) hours as needed for congestion.    . Azelastine-Fluticasone (DYMISTA) 137-50 MCG/ACT SUSP Place 2 puffs into the nose at bedtime. 1 Bottle 0  . glucose blood (ONETOUCH VERIO) test strip Check blood sugar twice daily 200 each 3  . metFORMIN (GLUCOPHAGE-XR) 500 MG 24 hr tablet TAKE 1 TABLET BY MOUTH  DAILY WITH BREAKFAST 90 tablet 6  . ONETOUCH DELICA LANCETS FINE MISC Check blood sugar twice daily 200 each 3  . cyclobenzaprine (FLEXERIL) 10 MG tablet TAKE 1 TABLET BY MOUTH AS NEEDED FOR HIP CONCERNS (Patient not taking: Reported on 06/03/2016) 30 tablet 0   No facility-administered medications prior to visit.     Allergies  Allergen Reactions  . Statins Other (See Comments)    Myalgias and muscle weakness    Review of Systems  Constitutional: Negative for fever and malaise/fatigue.  HENT: Negative for congestion.   Eyes: Negative for blurred vision.  Respiratory: Negative for cough and shortness of breath.   Cardiovascular: Negative for chest pain, palpitations and leg swelling.  Gastrointestinal: Negative for vomiting.  Musculoskeletal: Negative for back pain.  Skin: Negative for rash.  Neurological: Negative for loss of consciousness and headaches.       Objective:    Physical Exam  Constitutional: She is oriented to person, place, and time. She appears well-developed and well-nourished. No distress.  HENT:  Head: Normocephalic and atraumatic.  Eyes: Conjunctivae are normal.  Neck: Normal range of motion. No thyromegaly present.  Cardiovascular: Normal rate and regular rhythm.   Murmur heard. 2-3/6 murmur  Pulmonary/Chest: Effort normal and breath sounds normal. She has no wheezes.  Abdominal: Soft. Bowel sounds are normal. There is no tenderness.  Musculoskeletal: Normal range of motion. She exhibits no edema or deformity.  Neurological: She is alert and oriented to person,  place, and time.  Skin: Skin is warm and dry. She is not diaphoretic.  Psychiatric: She has a normal mood and affect.    BP 122/78 (BP Location: Left Arm, Patient Position: Sitting, Cuff Size: Normal)   Pulse 64   Temp 98.5 F (36.9 C) (Oral)   Wt 152 lb 3.2 oz (69 kg)   SpO2 97%   BMI 25.83 kg/m  Wt Readings from Last 3  Encounters:  06/03/16 152 lb 3.2 oz (69 kg)  05/25/16 146 lb 6.4 oz (66.4 kg)  11/24/15 144 lb 9.6 oz (65.6 kg)     Lab Results  Component Value Date   WBC 8.2 11/24/2015   HGB 12.4 11/24/2015   HCT 37.7 11/24/2015   PLT 257.0 11/24/2015   GLUCOSE 104 (H) 11/24/2015   CHOL 229 (H) 11/24/2015   TRIG 146.0 11/24/2015   HDL 57.00 11/24/2015   LDLDIRECT 125.7 06/02/2014   LDLCALC 143 (H) 11/24/2015   ALT 17 11/24/2015   AST 18 11/24/2015   NA 139 11/24/2015   K 3.9 11/24/2015   CL 104 11/24/2015   CREATININE 0.88 11/24/2015   BUN 17 11/24/2015   CO2 28 11/24/2015   TSH 2.60 04/05/2013   INR 0.96 05/28/2012   HGBA1C 6.5 11/24/2015   MICROALBUR <0.7 11/24/2015    Lab Results  Component Value Date   TSH 2.60 04/05/2013   Lab Results  Component Value Date   WBC 8.2 11/24/2015   HGB 12.4 11/24/2015   HCT 37.7 11/24/2015   MCV 86.3 11/24/2015   PLT 257.0 11/24/2015   Lab Results  Component Value Date   NA 139 11/24/2015   K 3.9 11/24/2015   CO2 28 11/24/2015   GLUCOSE 104 (H) 11/24/2015   BUN 17 11/24/2015   CREATININE 0.88 11/24/2015   BILITOT 0.5 11/24/2015   ALKPHOS 76 11/24/2015   AST 18 11/24/2015   ALT 17 11/24/2015   PROT 7.3 11/24/2015   ALBUMIN 4.1 11/24/2015   CALCIUM 9.7 11/24/2015   GFR 66.61 11/24/2015   Lab Results  Component Value Date   CHOL 229 (H) 11/24/2015   Lab Results  Component Value Date   HDL 57.00 11/24/2015   Lab Results  Component Value Date   LDLCALC 143 (H) 11/24/2015   Lab Results  Component Value Date   TRIG 146.0 11/24/2015   Lab Results  Component Value Date   CHOLHDL 4 11/24/2015    Lab Results  Component Value Date   HGBA1C 6.5 11/24/2015   I acted as a Education administrator for [employer]     Assessment & Plan:   Problem List Items Addressed This Visit      Unprioritized   Controlled diabetes mellitus type 2 with complications ([place])   Relevant Medications   metFORMIN (GLUCOPHAGE-XR) 500 MG 24 hr tablet   ONETOUCH DELICA LANCETS FINE MISC   glucose blood (ONETOUCH VERIO) test strip   Other Relevant Orders   CBC   Hemoglobin A1c   Comprehensive metabolic panel   TSH   Microalbumin / creatinine urine ratio   Urinalysis      I have changed [name] metFORMIN. I am also having her maintain her MULTIVITAMIN, calcium gluconate, cholecalciferol, polyethylene glycol, docusate sodium, gabapentin, aspirin, mirabegron ER, HYDROcodone-acetaminophen, Lactobacillus (PROBIOTIC ACIDOPHILUS PO), cetirizine, pseudoephedrine, cyclobenzaprine, alendronate, cyclobenzaprine, meloxicam, Fluticasone-Salmeterol, albuterol, ONETOUCH DELICA LANCETS FINE, glucose blood, and Azelastine-Fluticasone.  Meds ordered this encounter  Medications  . metFORMIN (GLUCOPHAGE-XR) 500 MG 24 hr tablet    Sig: Take 1 tablet (500 mg total) by mouth daily with breakfast.    Dispense:  90 tablet    Refill:  6  . ONETOUCH DELICA LANCETS FINE MISC    Sig: Check blood sugar twice daily    Dispense:  200 each    Refill:  3  . glucose blood (ONETOUCH VERIO) test strip    Sig: Check blood sugar twice daily    Dispense:  200 each    Refill:  3    DISCONTINUE ALL PREVIOUS REFILLS FOR THIS MEDICATION  . Azelastine-Fluticasone (DYMISTA) 137-50 MCG/ACT SUSP    Sig: Place 2 puffs into the nose at bedtime.    Dispense:  1 Bottle    Refill:  0    Order Specific Question:   Lot Number?    Answer:   [id number]    Order Specific Question:   Expiration Date?    Answer:   09/27/2017    Order Specific Question:   Quantity    Answer:   1    CMA served as scribe during this visit. History, Physical and  Plan performed by medical provider. Documentation and orders reviewed and attested to.   [name], DO

## 2016-06-03 NOTE — Progress Notes (Signed)
Pre visit review using our clinic review tool, if applicable. No additional management support is needed unless otherwise documented below in the visit note. 

## 2016-06-03 NOTE — Progress Notes (Signed)
Subjective:    Patient ID: [name], female    DOB: December 20, 1940, 76 y.o.   MRN: [id number]  Chief Complaint  Patient presents with  . Follow-up    6 month     HPI Patient is in today for 6 month follow up --dm, chol and htn  DIABETES    Blood Sugar ranges-good per pt  Polyuria- no New Visual problems- no  Hypoglycemic symptoms- no  Other side effects-no Medication compliance - good Last eye exam- in last 12 m Foot exam- today    Past Medical History:  Diagnosis Date  . Arthritis   . Asthma    adult onset  . Basal cell cancer    LUE; Porokeratosis also  . Complication of anesthesia    small trachea  . Diabetes mellitus 2010   A1c 6.7%  . DVT (deep venous thrombosis) ([place]) 2006   post immobilization post cns surgery  . GERD (gastroesophageal reflux disease)    very mild  . Granulomatous lung disease ([place]) 2002   incidental Xray finding  . Hyperlipidemia   . Hypertension 2004   Hypertensive response on Stress Test  . Paralysis ([place]) 2006   post cervical fusion with spinal sac tear  with hematoma   . PTE (pulmonary thromboembolism) ([place]) 2006    Past Surgical History:  Procedure Laterality Date  . BUNIONECTOMY    . CERVICAL FUSION  2006   Dr Annette Stable, NS;post op hematoma & cns leak & urinary retention  . COLONOSCOPY  1992 & 2002   negative  . epidural steroids  2006   cervical spine  . ROTATOR CUFF REPAIR  2009   Right  . SEPTOPLASTY    . TONSILLECTOMY  76 years old  . TOTAL HIP ARTHROPLASTY  06/01/2012   Procedure: TOTAL HIP ARTHROPLASTY ANTERIOR APPROACH;  Surgeon: Mcarthur Rossetti, MD;  Location: [facility];  Service: Orthopedics;  Laterality: Left;  Left Total Hip Arthroplasty  . TUBAL LIGATION      Family History  Problem Relation Age of Onset  . COPD Father     emphysema  . Cancer Mother     cns cancer  . Diabetes Sister     TWIN sister ; also Fibromyalgia ; S/P stent 2004  . Heart disease Sister     stents @ 63 & 72  . Stroke Maternal  Grandmother     in  late 2s  . Transient ischemic attack Paternal Aunt     Social History   Social History  . Marital status: Divorced    Spouse name: N/A  . Number of children: N/A  . Years of education: N/A   Occupational History  . Not on file.   Social History Main Topics  . Smoking status: Never Smoker  . Smokeless tobacco: Never Used  . Alcohol use 0.0 oz/week     Comment: twice a week; 2-4/week  . Drug use: No  . Sexual activity: Not Currently   Other Topics Concern  . Not on file   Social History Narrative  . No narrative on file    Outpatient Medications Prior to Visit  Medication Sig Dispense Refill  . albuterol (PROAIR HFA) 108 (90 Base) MCG/ACT inhaler Inhale 2 puffs into the lungs every 6 (six) hours as needed for wheezing or shortness of breath. 3 Inhaler 3  . alendronate (FOSAMAX) 70 MG tablet Take 1 tablet (70 mg total) by mouth every 7 (seven) days. Take with a full glass of water  on an empty stomach. 4 tablet 11  . aspirin 81 MG tablet Take 81 mg by mouth daily.    . calcium gluconate 500 MG tablet Take 600 mg by mouth 2 (two) times daily.     . cetirizine (ZYRTEC) 10 MG tablet Take 10 mg by mouth daily.    . cholecalciferol (VITAMIN D) 1000 UNITS tablet Take 1,000 Units by mouth daily. 2 pills daily    . cyclobenzaprine (FLEXERIL) 10 MG tablet TAKE 1 TABLET BY MOUTH AS NEEDED FOR HIP CONCERNS 30 tablet 0  . docusate sodium (COLACE) 100 MG capsule Take 100-200 mg by mouth every morning.    . Fluticasone-Salmeterol (ADVAIR DISKUS) 250-50 MCG/DOSE AEPB Use 1 inhalation two times  daily ;rinse mouth 180 each 3  . gabapentin (NEURONTIN) 300 MG capsule   1  . HYDROcodone-acetaminophen (NORCO/VICODIN) 5-325 MG tablet Take 1 tablet by mouth every 6 (six) hours as needed.  0  . Lactobacillus (PROBIOTIC ACIDOPHILUS PO) Take by mouth.    . meloxicam (MOBIC) 15 MG tablet TAKE 1 TABLET BY MOUTH EVERY DAY WITH FOOD AS NEEDED 30 tablet 0  . mirabegron ER (MYRBETRIQ)  50 MG TB24 tablet Take 1 tablet (50 mg total) by mouth daily. 30 tablet 5  . Multiple Vitamins-Minerals (MULTIVITAMIN) LIQD Take by mouth. Vemma    . polyethylene glycol (MIRALAX / GLYCOLAX) packet Take 17 g by mouth daily.    . pseudoephedrine (SUDAFED) 60 MG tablet Take 60 mg by mouth every 4 (four) hours as needed for congestion.    . Azelastine-Fluticasone (DYMISTA) 137-50 MCG/ACT SUSP Place 2 puffs into the nose at bedtime. 1 Bottle 0  . glucose blood (ONETOUCH VERIO) test strip Check blood sugar twice daily 200 each 3  . metFORMIN (GLUCOPHAGE-XR) 500 MG 24 hr tablet TAKE 1 TABLET BY MOUTH  DAILY WITH BREAKFAST 90 tablet 6  . ONETOUCH DELICA LANCETS FINE MISC Check blood sugar twice daily 200 each 3  . cyclobenzaprine (FLEXERIL) 10 MG tablet TAKE 1 TABLET BY MOUTH AS NEEDED FOR HIP CONCERNS (Patient not taking: Reported on 06/03/2016) 30 tablet 0   No facility-administered medications prior to visit.     Allergies  Allergen Reactions  . Statins Other (See Comments)    Myalgias and muscle weakness    Review of Systems  Constitutional: Negative for fever and malaise/fatigue.  HENT: Negative for congestion.   Eyes: Negative for blurred vision.  Respiratory: Negative for cough and shortness of breath.   Cardiovascular: Negative for chest pain, palpitations and leg swelling.  Gastrointestinal: Negative for vomiting.  Musculoskeletal: Negative for back pain.  Skin: Negative for rash.  Neurological: Negative for loss of consciousness and headaches.       Objective:    Physical Exam  Constitutional: She is oriented to person, place, and time. She appears well-developed and well-nourished. No distress.  HENT:  Head: Normocephalic and atraumatic.  Eyes: Conjunctivae are normal.  Neck: Normal range of motion. No thyromegaly present.  Cardiovascular: Normal rate and regular rhythm.   Pulmonary/Chest: Effort normal and breath sounds normal. She has no wheezes.  Abdominal: Soft. Bowel  sounds are normal. There is no tenderness.  Musculoskeletal: Normal range of motion. She exhibits no edema or deformity.  Neurological: She is alert and oriented to person, place, and time.  Skin: Skin is warm and dry. She is not diaphoretic.  Psychiatric: She has a normal mood and affect. Her behavior is normal. Judgment and thought content normal.  Nursing note and vitals  reviewed. Sensory exam of the foot is normal, tested with the monofilament. Good pulses, no lesions or ulcers, good peripheral pulses.  BP 122/78 (BP Location: Left Arm, Patient Position: Sitting, Cuff Size: Normal)   Pulse 64   Temp 98.5 F (36.9 C) (Oral)   Wt 152 lb 3.2 oz (69 kg)   SpO2 97%   BMI 25.83 kg/m  Wt Readings from Last 3 Encounters:  06/03/16 152 lb 3.2 oz (69 kg)  05/25/16 146 lb 6.4 oz (66.4 kg)  11/24/15 144 lb 9.6 oz (65.6 kg)     Lab Results  Component Value Date   WBC 8.2 11/24/2015   HGB 12.4 11/24/2015   HCT 37.7 11/24/2015   PLT 257.0 11/24/2015   GLUCOSE 104 (H) 11/24/2015   CHOL 229 (H) 11/24/2015   TRIG 146.0 11/24/2015   HDL 57.00 11/24/2015   LDLDIRECT 125.7 06/02/2014   LDLCALC 143 (H) 11/24/2015   ALT 17 11/24/2015   AST 18 11/24/2015   NA 139 11/24/2015   K 3.9 11/24/2015   CL 104 11/24/2015   CREATININE 0.88 11/24/2015   BUN 17 11/24/2015   CO2 28 11/24/2015   TSH 2.60 04/05/2013   INR 0.96 05/28/2012   HGBA1C 6.5 11/24/2015   MICROALBUR <0.7 11/24/2015    Lab Results  Component Value Date   TSH 2.60 04/05/2013   Lab Results  Component Value Date   WBC 8.2 11/24/2015   HGB 12.4 11/24/2015   HCT 37.7 11/24/2015   MCV 86.3 11/24/2015   PLT 257.0 11/24/2015   Lab Results  Component Value Date   NA 139 11/24/2015   K 3.9 11/24/2015   CO2 28 11/24/2015   GLUCOSE 104 (H) 11/24/2015   BUN 17 11/24/2015   CREATININE 0.88 11/24/2015   BILITOT 0.5 11/24/2015   ALKPHOS 76 11/24/2015   AST 18 11/24/2015   ALT 17 11/24/2015   PROT 7.3 11/24/2015    ALBUMIN 4.1 11/24/2015   CALCIUM 9.7 11/24/2015   GFR 66.61 11/24/2015   Lab Results  Component Value Date   CHOL 229 (H) 11/24/2015   Lab Results  Component Value Date   HDL 57.00 11/24/2015   Lab Results  Component Value Date   LDLCALC 143 (H) 11/24/2015   Lab Results  Component Value Date   TRIG 146.0 11/24/2015   Lab Results  Component Value Date   CHOLHDL 4 11/24/2015   Lab Results  Component Value Date   HGBA1C 6.5 11/24/2015   I acted as a Education administrator for Dr. Curt Jews, RMA     Assessment & Plan:   Problem List Items Addressed This Visit      Unprioritized   Controlled diabetes mellitus type 2 with complications ([place])   Relevant Medications   metFORMIN (GLUCOPHAGE-XR) 500 MG 24 hr tablet   ONETOUCH DELICA LANCETS FINE MISC   glucose blood (ONETOUCH VERIO) test strip   Other Relevant Orders   CBC   Hemoglobin A1c   Comprehensive metabolic panel   TSH   Microalbumin / creatinine urine ratio   Urinalysis    stable, cont meds rto 6 months  I have changed Ms. Kampman metFORMIN. I am also having her maintain her MULTIVITAMIN, calcium gluconate, cholecalciferol, polyethylene glycol, docusate sodium, gabapentin, aspirin, mirabegron ER, HYDROcodone-acetaminophen, Lactobacillus (PROBIOTIC ACIDOPHILUS PO), cetirizine, pseudoephedrine, cyclobenzaprine, alendronate, cyclobenzaprine, meloxicam, Fluticasone-Salmeterol, albuterol, ONETOUCH DELICA LANCETS FINE, glucose blood, and Azelastine-Fluticasone.  Meds ordered this encounter  Medications  . metFORMIN (GLUCOPHAGE-XR) 500 MG 24 hr tablet  Sig: Take 1 tablet (500 mg total) by mouth daily with breakfast.    Dispense:  90 tablet    Refill:  6  . ONETOUCH DELICA LANCETS FINE MISC    Sig: Check blood sugar twice daily    Dispense:  200 each    Refill:  3  . glucose blood (ONETOUCH VERIO) test strip    Sig: Check blood sugar twice daily    Dispense:  200 each    Refill:  3    DISCONTINUE ALL PREVIOUS  REFILLS FOR THIS MEDICATION  . Azelastine-Fluticasone (DYMISTA) 137-50 MCG/ACT SUSP    Sig: Place 2 puffs into the nose at bedtime.    Dispense:  1 Bottle    Refill:  0    Order Specific Question:   Lot Number?    Answer:   [id number]    Order Specific Question:   Expiration Date?    Answer:   09/27/2017    Order Specific Question:   Quantity    Answer:   1   CMA served as scribe during this visit. History, Physical and Plan performed by medical provider. Documentation and orders reviewed and attested to.      [name], DO

## 2016-06-03 NOTE — Patient Instructions (Signed)

## 2016-06-20 DIAGNOSIS — M4317 Spondylolisthesis, lumbosacral region: Secondary | ICD-10-CM | POA: Diagnosis not present

## 2016-06-20 DIAGNOSIS — R030 Elevated blood-pressure reading, without diagnosis of hypertension: Secondary | ICD-10-CM | POA: Diagnosis not present

## 2016-06-20 DIAGNOSIS — M48062 Spinal stenosis, lumbar region with neurogenic claudication: Secondary | ICD-10-CM | POA: Diagnosis not present

## 2016-06-23 ENCOUNTER — Other Ambulatory Visit: Payer: Self-pay | Admitting: Neurosurgery

## 2016-06-24 ENCOUNTER — Telehealth: Payer: Self-pay | Admitting: Vascular Surgery

## 2016-06-24 NOTE — Telephone Encounter (Signed)
LVM on Mobile #, mailed letter w/new pt info

## 2016-06-24 NOTE — Telephone Encounter (Signed)
-----   Message from Gregery Na, RN sent at 06/23/2016  4:48 PM EST ----- Regarding: OV with TFE Ms. Chao is scheduled for L5-S1 ALIF with Drs. Early Vertell Limber on 08/12/16. Will you please schedule an office visit with Dr. Donnetta Hutching at least two weeks prior to surgery? Please remind patient to bring LS spine films to appt.  Thanks, Colletta [place]

## 2016-07-05 ENCOUNTER — Other Ambulatory Visit (INDEPENDENT_AMBULATORY_CARE_PROVIDER_SITE_OTHER): Payer: Self-pay | Admitting: Orthopedic Surgery

## 2016-07-05 ENCOUNTER — Other Ambulatory Visit: Payer: Self-pay | Admitting: Family Medicine

## 2016-07-05 NOTE — Telephone Encounter (Signed)
Rx request 

## 2016-07-13 ENCOUNTER — Other Ambulatory Visit: Payer: Self-pay

## 2016-07-20 ENCOUNTER — Encounter: Payer: Self-pay | Admitting: Vascular Surgery

## 2016-07-26 ENCOUNTER — Ambulatory Visit (INDEPENDENT_AMBULATORY_CARE_PROVIDER_SITE_OTHER): Payer: Medicare Other | Admitting: Vascular Surgery

## 2016-07-26 ENCOUNTER — Encounter: Payer: Self-pay | Admitting: Vascular Surgery

## 2016-07-26 VITALS — BP 124/69 | HR 74 | Temp 98.2°F | Resp 16 | Ht 64.0 in | Wt 147.0 lb

## 2016-07-26 DIAGNOSIS — M545 Low back pain, unspecified: Secondary | ICD-10-CM

## 2016-07-26 NOTE — Progress Notes (Signed)
Referring Physician: Dr. Vertell Limber  Patient name: [name] MRN: [id number] DOB: [birth year] Sex: female  REASON FOR CONSULT: ALIF exposure  HPI: [name] is a 76 y.o. female,  With L5-S1 DDD and herniated disc.  She has faiiled medical management and is now scheduled for ALIF with Dr. Donald Pore.   Other medical problems include asthma, history of right LE DVT, PE, 2006.  She was on anticoagulation for 1 year.  DM managed with oral medications.  [name] and HTN.  Past Medical History:  Diagnosis Date  . Arthritis   . Asthma    adult onset  . Basal cell cancer    LUE; Porokeratosis also  . Complication of anesthesia    small trachea  . Diabetes mellitus 2010   A1c 6.7%  . DVT (deep venous thrombosis) ([place]) 2006   post immobilization post cns surgery  . GERD (gastroesophageal reflux disease)    very mild  . Granulomatous lung disease ([place]) 2002   incidental Xray finding  . Hyperlipidemia   . Hypertension 2004   Hypertensive response on Stress Test  . Paralysis ([place]) 2006   post cervical fusion with spinal sac tear  with hematoma   . PTE (pulmonary thromboembolism) ([place]) 2006   Past Surgical History:  Procedure Laterality Date  . BUNIONECTOMY    . CERVICAL FUSION  2006   Dr Annette Stable, NS;post op hematoma & cns leak & urinary retention  . COLONOSCOPY  1992 & 2002   negative  . epidural steroids  2006   cervical spine  . ROTATOR CUFF REPAIR  2009   Right  . SEPTOPLASTY    . TONSILLECTOMY  76 years old  . TOTAL HIP ARTHROPLASTY  06/01/2012   Procedure: TOTAL HIP ARTHROPLASTY ANTERIOR APPROACH;  Surgeon: Mcarthur Rossetti, MD;  Location: [facility];  Service: Orthopedics;  Laterality: Left;  Left Total Hip Arthroplasty  . TUBAL LIGATION      Family History  Problem Relation Age of Onset  . COPD Father     emphysema  . Cancer Mother     cns cancer  . Diabetes Sister     TWIN sister ; also Fibromyalgia ; S/P stent 2004  . Heart disease Sister     stents @ 5 & 49  .  Stroke Maternal Grandmother     in  late 77s  . Transient ischemic attack Paternal Aunt     SOCIAL HISTORY: Social History   Social History  . Marital status: Divorced    Spouse name: N/A  . Number of children: N/A  . Years of education: N/A   Occupational History  . Not on file.   Social History Main Topics  . Smoking status: Never Smoker  . Smokeless tobacco: Never Used  . Alcohol use 0.0 oz/week     Comment: twice a week; 2-4/week  . Drug use: No  . Sexual activity: Not Currently   Other Topics Concern  . Not on file   Social History Narrative  . No narrative on file    Allergies  Allergen Reactions  . Statins Other (See Comments)    Myalgias and muscle weakness    Current Outpatient Prescriptions  Medication Sig Dispense Refill  . albuterol (PROAIR HFA) 108 (90 Base) MCG/ACT inhaler Inhale 2 puffs into the lungs every 6 (six) hours as needed for wheezing or shortness of breath. 3 Inhaler 3  . alendronate (FOSAMAX) 70 MG tablet Take 1 tablet (70 mg total) by  mouth every 7 (seven) days. Take with a full glass of water on an empty stomach. 4 tablet 11  . aspirin 81 MG tablet Take 81 mg by mouth daily.    . calcium gluconate 500 MG tablet Take 600 mg by mouth 2 (two) times daily.     . cholecalciferol (VITAMIN D) 1000 UNITS tablet Take 1,000 Units by mouth daily. 2 pills daily    . cyclobenzaprine (FLEXERIL) 10 MG tablet TAKE 1 TABLET BY MOUTH AS NEEDED FOR HIP CONCERNS 30 tablet 0  . docusate sodium (COLACE) 100 MG capsule Take 100-200 mg by mouth every morning.    . Fluticasone-Salmeterol (ADVAIR DISKUS) 250-50 MCG/DOSE AEPB Use 1 inhalation two times  daily ;rinse mouth 180 each 3  . gabapentin (NEURONTIN) 300 MG capsule   1  . glucose blood (ONETOUCH VERIO) test strip Check blood sugar twice daily 200 each 3  . HYDROcodone-acetaminophen (NORCO/VICODIN) 5-325 MG tablet Take 1 tablet by mouth every 6 (six) hours as needed.  0  . meloxicam (MOBIC) 15 MG tablet TAKE  1 TABLET BY MOUTH EVERY DAY WITH FOOD AS NEEDED 30 tablet 0  . metFORMIN (GLUCOPHAGE-XR) 500 MG 24 hr tablet Take 1 tablet (500 mg total) by mouth daily with breakfast. 90 tablet 6  . mirabegron ER (MYRBETRIQ) 50 MG TB24 tablet Take 1 tablet (50 mg total) by mouth daily. 30 tablet 5  . Multiple Vitamins-Minerals (MULTIVITAMIN) LIQD Take by mouth. Vemma    . ONETOUCH DELICA LANCETS FINE MISC Check blood sugar twice daily 200 each 3  . polyethylene glycol (MIRALAX / GLYCOLAX) packet Take 17 g by mouth daily.    . cetirizine (ZYRTEC) 10 MG tablet Take 10 mg by mouth daily.    . Lactobacillus (PROBIOTIC ACIDOPHILUS PO) Take by mouth.     No current facility-administered medications for this visit.     ROS:   General:  No weight loss, Fever, chills  HEENT: No recent headaches, no nasal bleeding, no visual changes, no sore throat  Neurologic: No dizziness, blackouts, seizures. No recent symptoms of stroke or mini- stroke. No recent episodes of slurred speech, or temporary blindness.  Cardiac: No recent episodes of chest pain/pressure, no shortness of breath at rest.  No shortness of breath with exertion.  Denies history of atrial fibrillation or irregular heartbeat  Vascular: No history of rest pain in feet.  No history of claudication.  No history of non-healing ulcer, No history of DVT   Pulmonary: No home oxygen, no productive cough, no hemoptysis,  positive asthma or wheezing  Musculoskeletal:  [X]  Arthritis, [X]  Low back pain,  [X]  Joint pain  Hematologic:No history of hypercoagulable state.  No history of easy bleeding.  No history of anemia  Gastrointestinal: No hematochezia or melena,  No gastroesophageal reflux, no trouble swallowing  Urinary: [ ]  chronic Kidney disease, [ ]  on HD - [ ]  MWF or [ ]  TTHS, [ ]  Burning with urination, [x ] Frequent urination, [ ]  Difficulty urinating;   Skin: No rashes  Psychological: No history of anxiety,  No history of depression   Physical  Examination  Vitals:   07/26/16 1419  BP: 124/69  Pulse: 74  Resp: 16  Temp: 98.2 F (36.8 C)  TempSrc: Oral  SpO2: 97%  Weight: 147 lb (66.7 kg)  Height: 5\' 4"  (1.626 m)    Body mass index is 25.23 kg/m.  General:  Alert and oriented, no acute distress HEENT: Normal Neck: positive right negative left bruit  Pulmonary: Clear to auscultation bilaterally Cardiac: Regular Rate and Rhythm without murmur Abdomen: Soft, non-tender, non-distended, no mass, no scars Skin: No rash Extremity Pulses:  2+ radial, brachial, femoral, dorsalis pedis, posterior tibial pulses bilaterally Musculoskeletal: No deformity or edema  Neurologic: Upper and lower extremity motor 5/5 and symmetric  DATA:   MRI Hypoplastic right facet at L5-S1 results in 0.4 cm anterolisthesis. There is mild right foraminal narrowing at this level. The central canal and left foramen are open.  L5-S1: The disc is uncovered with a shallow bulge. The central canal and left foramen are widely patent. Mild right foraminal narrowing.  ASSESSMENT:   L5-S1 DDD with central herniated disc right foraminal narrowing   PLAN:   Anterior L5-S1 disc exposure for ALIF procedure.     COLLINS, EMMA MAUREEN PA-C [facility]  Seen in conjunction with Dr. Donnetta Hutching today in clinic.  I have examined the patient, reviewed and agree with above. Long discussion with the patient and her daughter present. Splane my role for anterior exposure. Explain mobilization of intraperitoneal contents. Also mobilization of the left ureter and arterial venous structures overlying the spine. Explained the potential injury for all of these. She has failed medical management continues to have neurogenic issues regarding her herniated disc. I do not see any contraindication to anterior exposure. I did review her plain films showing minimal calcification of her aorta. She has had tubal ligation but no other pelvic surgeries.  We'll plan surgery on 08/12/2016 as scheduled  Curt Jews, MD 07/26/2016 4:12 PM

## 2016-07-30 ENCOUNTER — Other Ambulatory Visit: Payer: Self-pay | Admitting: Family Medicine

## 2016-07-30 DIAGNOSIS — N3281 Overactive bladder: Secondary | ICD-10-CM

## 2016-08-02 ENCOUNTER — Encounter (HOSPITAL_COMMUNITY): Payer: Self-pay

## 2016-08-02 NOTE — Pre-Procedure Instructions (Addendum)
[name]  08/02/2016      [place], [place] [address] Phone: [phone] Fax: [phone]  [facility] [place], [address] AT [address] [place] [address] Phone: [phone] Fax: [phone]  [place], [place] [facility] [address] [place] [address] [place] [address] Phone: [phone] Fax: [phone]  [facility] [place], [place] [facility] [address] AT [facility] [address] [place] [address] Phone: [phone] Fax: [phone]    Your procedure is scheduled on March 16  Report to [facility] at 5:30 A.M.  Call this number if you have problems the morning of surgery:  [phone]   Remember:  Do not eat food or drink liquids after midnight on  March 15   Take these medicines the morning of surgery with A SIP OF WATER : albuterol inhaler if needed--bring to hospital, cyclobenaprine (flexeril) if needed, advair -bring to hospital, gabapentin(neurontin), hydrocodone if needed, myrbetriq, omeprazole (prilosec),                   1 week prior to surgery aspirin, advil, motrin, aleve, mobic, BC Powders, Goody's, vitamins/herbal medicines.     How to Manage Your Diabetes Before and After Surgery  Why is it important to control my blood sugar before and after surgery? . Improving blood sugar levels before and after surgery helps healing and can limit problems. . A way of improving blood sugar control is eating a healthy diet by: o  Eating less sugar and carbohydrates o  Increasing activity/exercise o  Talking with your doctor about reaching your blood sugar goals . High blood sugars (greater than 180 mg/dL) can raise your risk of infections and slow your recovery, so you will need to focus on controlling your diabetes  during the weeks before surgery. . Make sure that the doctor who takes care of your diabetes knows about your planned surgery including the date and location.  How do I manage my blood sugar before surgery? . Check your blood sugar at least 4 times a day, starting 2 days before surgery, to make sure that the level is not too high or low. o Check your blood sugar the morning of your surgery when you wake up and every 2 hours until you get to the Short Stay unit. . If your blood sugar is less than 70 mg/dL, you will need to treat for low blood sugar: o Do not take insulin. o Treat a low blood sugar (less than 70 mg/dL) with  cup of clear juice (cranberry or apple), 4 glucose tablets, OR glucose gel. o Recheck blood sugar in 15 minutes after treatment (to make sure it is greater than 70 mg/dL). If your blood sugar is not greater than 70 mg/dL on recheck, call [phone] for further instructions. . Report your blood sugar to the short stay nurse when you get to Short Stay.  . If you are admitted to the hospital after surgery: o Your blood sugar will be checked by the staff and you will probably be given insulin after surgery (instead of oral diabetes medicines) to make sure you have good blood sugar levels. o The goal for blood sugar control after surgery is 80-180 mg/dL.  WHAT DO I DO ABOUT MY DIABETES MEDICATION?   [name] Do not take oral diabetes medicines (pills) the morning of surgery.       Do not wear jewelry, make-up or nail polish.  Do not wear lotions, powders, or perfumes, or deoderant.  Do not shave 48 hours prior to surgery.  Men may shave face and neck.  Do not bring valuables to the hospital.  [facility] is not responsible for any belongings or valuables.  Contacts, dentures or bridgework may not be worn into surgery.  Leave your suitcase in the car.  After surgery it may be brought to your room.  For patients admitted to the hospital, discharge time will be determined  by your treatment team.     Special instructions:  [facility]- Preparing For Surgery  Before surgery, you can play an important role. Because skin is not sterile, your skin needs to be as free of germs as possible. You can reduce the number of germs on your skin by washing with CHG (chlorahexidine gluconate) Soap before surgery.  CHG is an antiseptic cleaner which kills germs and bonds with the skin to continue killing germs even after washing.  Please do not use if you have an allergy to CHG or antibacterial soaps. If your skin becomes reddened/irritated stop using the CHG.  Do not shave (including legs and underarms) for at least 48 hours prior to first CHG shower. It is OK to shave your face.  Please follow these instructions carefully.   1. Shower the NIGHT BEFORE SURGERY and the MORNING OF SURGERY with CHG.   2. If you chose to wash your hair, wash your hair first as usual with your normal shampoo.  3. After you shampoo, rinse your hair and body thoroughly to remove the shampoo.  4. Use CHG as you would any other liquid soap. You can apply CHG directly to the skin and wash gently with a scrungie or a clean washcloth.   5. Apply the CHG Soap to your body ONLY FROM THE NECK DOWN.  Do not use on open wounds or open sores. Avoid contact with your eyes, ears, mouth and genitals (private parts). Wash genitals (private parts) with your normal soap.  6. Wash thoroughly, paying special attention to the area where your surgery will be performed.  7. Thoroughly rinse your body with warm water from the neck down.  8. DO NOT shower/wash with your normal soap after using and rinsing off the CHG Soap.  9. Pat yourself dry with a CLEAN TOWEL.   10. Wear CLEAN PAJAMAS   11. Place CLEAN SHEETS on your bed the night of your first shower and DO NOT SLEEP WITH PETS.    Day of Surgery: Do not apply any deodorants/lotions. Please wear clean clothes to the hospital/surgery center.       Please read over the following fact sheets that you were given. Coughing and Deep Breathing and MRSA Information

## 2016-08-03 ENCOUNTER — Encounter (HOSPITAL_COMMUNITY)
Admission: RE | Admit: 2016-08-03 | Discharge: 2016-08-03 | Disposition: A | Discharge status: Home / Self Care | Payer: Medicare Other | Source: Ambulatory Visit | Attending: Neurosurgery | Admitting: Neurosurgery

## 2016-08-03 ENCOUNTER — Encounter (HOSPITAL_COMMUNITY): Payer: Self-pay

## 2016-08-03 DIAGNOSIS — H40013 Open angle with borderline findings, low risk, bilateral: Secondary | ICD-10-CM | POA: Diagnosis not present

## 2016-08-03 DIAGNOSIS — Z01812 Encounter for preprocedural laboratory examination: Secondary | ICD-10-CM | POA: Insufficient documentation

## 2016-08-03 DIAGNOSIS — E119 Type 2 diabetes mellitus without complications: Secondary | ICD-10-CM | POA: Diagnosis not present

## 2016-08-03 DIAGNOSIS — Z01810 Encounter for preprocedural cardiovascular examination: Secondary | ICD-10-CM | POA: Diagnosis not present

## 2016-08-03 DIAGNOSIS — H10413 Chronic giant papillary conjunctivitis, bilateral: Secondary | ICD-10-CM | POA: Diagnosis not present

## 2016-08-03 HISTORY — DX: Chronic kidney disease, unspecified: N18.9

## 2016-08-03 HISTORY — DX: Anemia, unspecified: D64.9

## 2016-08-03 LAB — SURGICAL PCR SCREEN
MRSA, PCR: NEGATIVE
Staphylococcus aureus: NEGATIVE

## 2016-08-03 LAB — CBC
HEMATOCRIT: 36.3 % (ref 36.0–46.0)
HEMOGLOBIN: 11.8 g/dL — AB (ref 12.0–15.0)
MCH: 28.4 pg (ref 26.0–34.0)
MCHC: 32.5 g/dL (ref 30.0–36.0)
MCV: 87.3 fL (ref 78.0–100.0)
Platelets: 241 10*3/uL (ref 150–400)
RBC: 4.16 MIL/uL (ref 3.87–5.11)
RDW: 13.7 % (ref 11.5–15.5)
WBC: 7 10*3/uL (ref 4.0–10.5)

## 2016-08-03 LAB — BASIC METABOLIC PANEL
ANION GAP: 9 (ref 5–15)
BUN: 17 mg/dL (ref 6–20)
CHLORIDE: 108 mmol/L (ref 101–111)
CO2: 23 mmol/L (ref 22–32)
Calcium: 9.1 mg/dL (ref 8.9–10.3)
Creatinine, Ser: 0.87 mg/dL (ref 0.44–1.00)
GFR calc non Af Amer: >60 mL/min (ref >60)
Glucose, Bld: 115 mg/dL — ABNORMAL HIGH (ref 65–99)
POTASSIUM: 4.2 mmol/L (ref 3.5–5.1)
SODIUM: 140 mmol/L (ref 135–145)

## 2016-08-03 LAB — GLUCOSE, CAPILLARY: Glucose-Capillary: 124 mg/dL — ABNORMAL HIGH (ref 65–99)

## 2016-08-03 LAB — TYPE AND SCREEN
ABO/RH(D): B POS
Antibody Screen: NEGATIVE

## 2016-08-03 NOTE — Progress Notes (Signed)
PCP: Aviva Signs chase Pulmologist: Dr. Keturah Barre  Fasting sugars 106-135

## 2016-08-04 LAB — HEMOGLOBIN A1C
HEMOGLOBIN A1C: 6.2 % — AB (ref 4.8–5.6)
Mean Plasma Glucose: 131 mg/dL

## 2016-08-04 NOTE — Progress Notes (Signed)
Anesthesia Chart Review:  Pt is a 76 year old female scheduled for L2-3, L3-4, L4-5, L5-S1 ALIF, L2-S1 percutaneous pedicle screws, abdominal exposure on 08/12/2016 with Erline Levine, M.D. and Curt Jews, M.D.  PCP is [name], DO, last office visit 06/03/16 Pulmonologist is Baird Lyons, M.D. last office visit 05/25/16  PMH includes:  HTN, DM, hyperlipidemia, DVT and PE (2006). Granulomatous lung disease, asthma, anemia, GERD. Never smoker. BMI 26.5.  Anesthesia history includes: Patient reports small trachea. Anesthesia record from 06/01/12 in [place] shows video laryngoscope was used.  Medications include: Albuterol, ASA 81 mg, Advair, metformin, Prilosec  Preoperative labs reviewed.  HbA1c 6.2, glucose 115  EKG 08/03/16: NSR  Carotid duplex 05/15/13:  1. 1-39% R ICA stenosis. 2. 9-43% LICA stenosis.  If no changes, I anticipate pt can proceed with surgery as scheduled.   Willeen Cass, FNP-BC [facility] Short Stay Surgical Center/Anesthesiology Phone: [phone] 08/04/2016 2:36 PM

## 2016-08-11 NOTE — H&P (Signed)
Patient ID:   [id number] Patient: [name]  Date of Birth: [birth year] Visit Type: Office Visit   Date: 06/20/2016 01:15 PM Provider: Marchia Meiers. Vertell Limber MD   This 76 year old female presents for back pain.  History of Present Illness: 1.  back pain    That patient comes in with lower left back and leg pain and to review her MRI and X-rays.  05/21/16 MRI: No change in the appearance of the lumbar since the prior exams.  No new abnormality.  Hypoplastic right facet at L5-S1 results in 0.4 cm anterolisthesis.  There is mild right foraminal narrowing at this level.  The central canal and left foramen are open.  Mild subarticular recess narrowing on the left and mild spinal stenosis overall at L3-4.  No nerve root compression is identified.  Physical Exam: Left foot weakness, right leg weakness, mild right EHL weakness, and left sciatic notch discomfort.      Medical/Surgical/Interim History Reviewed, no change.  Last detailed document date:10/29/2014.   Family History: Reviewed, no changes.  Last detailed document: 10/29/2014.   Social History: Tobacco use reviewed. Reviewed, no changes. Last detailed document date: 10/29/2014.      MEDICATIONS(added, continued or stopped this visit): Started Medication Directions Instruction Stopped   Advair Diskus 250 mcg-50 mcg/dose powder for inhalation inhale 1 puff by inhalation route 2 times every day in the morning and evening approximately 12 hours apart     alendronate 70 mg tablet take 1 tablet by oral route  every week in the morning, at least 30 min before first food, beverage, or medication of day     gabapentin 300 mg capsule take 1 capsule by oral route 3 times every day    06/20/2016 hydrocodone 5 mg-acetaminophen 325 mg tablet take 1 tablet by oral route  every 6 hours as needed for pain    04/06/2016 hydrocodone 5 mg-acetaminophen 325 mg tablet take 1 tablet by oral route  every 6 hours as needed for pain  06/20/2016   metformin 500 mg tablet take 1 tablet by oral route  every day     ProAir HFA 90 mcg/actuation aerosol inhaler As needed     Tylenol Arthritis Pain 650 mg tablet,extended release take 2 tablet by oral route  every 8 hours as needed swallowing whole with water. Do not break, crush, dissolve and/or chew.       ALLERGIES: Ingredient Reaction Medication Name Comment  NO KNOWN ALLERGIES     No known allergies. Reviewed, no changes.    Vitals Date Temp F BP Pulse Ht In Wt Lb BMI BSA Pain Score  06/20/2016  158/78 73 64 153 26.26  7/10      IMPRESSION The patient comes in with increased pain in her lower back on the left and entire right leg, she denies any foot pain.  She reports that she is more comfortable walking with a walker rather than a cane.  She has had shots and therapy in the past but is only getting worse.  She is treating her symptoms with hydrocodone 1-2x/day.  Her bone density scan shows a T-score of -1.5, indicating she is osteopenic and strong enough for surgery.  On confrontational testing, she has increased weakness in her left foot, left sciatic notch discomfort, and mild right EHL weakness.  She has increased weakness in her right leg as well.  On review of her X-rays, she has L5-S1 anterolisthesis resulting in foraminal stenosis on the right side with disc material  compressing right L5 nerve.  There is fair amount of rotation in her spine and thoracic curvature.  I believe her symptoms are coming from her L5-S1 anterolisthesis and warrant surgery.  I am going to refill her Hydrocodone prescription and schedule a L2-5 XLIF and L5-S1 ALIF. I will have to do some calculations, but anticipate L 2 - S 1 fusion will be sufficient.    Completed Orders (this encounter) Order Details Reason Side Interpretation Result Initial Treatment Date Region  Lifestyle education regarding diet Encouraged patient to eat well balanced diet.        Lifestyle education Patient will monitor and  contact primary care physician if needed.         Assessment/Plan # Detail Type Description   1. Assessment Body mass index (BMI) 26.0-26.9, adult (V76.16).   Plan Orders Today's instructions / counseling include(s) Lifestyle education regarding diet.       2. Assessment Elevated blood-pressure reading, w/o diagnosis of htn (R03.0).         Pain Assessment/Treatment Pain Scale: 7/10. Method: Numeric Pain Intensity Scale. Location: back. Onset: 06/13/2014. Duration: varies. Quality: discomforting. Pain Assessment/Treatment follow-up plan of care: Patient will continue medication management..  Refilled Hydrocodone.  Scheduled L2-5 XLIF and L5-S1 ALIF.  Fitted for LSO brace.  Nurse education given.   Orders: Instruction(s)/Education: Assessment Instruction  R03.0 Lifestyle education  [id number] Lifestyle education regarding diet    MEDICATIONS PRESCRIBED TODAY    Rx Quantity Refills  HYDROCODONE-ACETAMINOPHEN 5 mg-325 mg  60 0            Provider:  Marchia Meiers. Vertell Limber MD  06/20/2016 02:25 PM Dictation edited by: Daine Gravel    CC Providers: [facility] [address] [place], [place] [address]-              Electronically signed by Marchia Meiers. Vertell Limber MD on 06/21/2016 03:37 PM

## 2016-08-11 NOTE — Anesthesia Preprocedure Evaluation (Addendum)
Anesthesia Evaluation  Patient identified by MRN, date of birth, ID band Patient awake    Reviewed: Allergy & Precautions, NPO status , Patient's Chart, lab work & pertinent test results  History of Anesthesia Complications Negative for: history of anesthetic complications  Airway Mallampati: II  TM Distance: >3 FB Neck ROM: Full    Dental  (+) Dental Advisory Given   Pulmonary asthma , PE   breath sounds clear to auscultation       Cardiovascular hypertension (no meds), (-) angina+ DVT   Rhythm:Regular Rate:Normal     Neuro/Psych Chronic back pain: narcotics    GI/Hepatic Neg liver ROS, GERD  Medicated and Controlled,  Endo/Other  diabetes (glu 138), Oral Hypoglycemic Agents  Renal/GU negative Renal ROS     Musculoskeletal  (+) Arthritis , Osteoarthritis,    Abdominal   Peds  Hematology negative hematology ROS (+)   Anesthesia Other Findings   Reproductive/Obstetrics                            Anesthesia Physical Anesthesia Plan  ASA: III  Anesthesia Plan: General   Post-op Pain Management:    Induction: Intravenous  Airway Management Planned: Oral ETT  Additional Equipment: Arterial line  Intra-op Plan:   Post-operative Plan: Extubation in OR  Informed Consent: I have reviewed the patients History and Physical, chart, labs and discussed the procedure including the risks, benefits and alternatives for the proposed anesthesia with the patient or authorized representative who has indicated his/her understanding and acceptance.   Dental advisory given  Plan Discussed with: CRNA and Surgeon  Anesthesia Plan Comments: (Plan routine monitors, A-line for sampling, GETA)       Anesthesia Quick Evaluation

## 2016-08-12 ENCOUNTER — Encounter (HOSPITAL_COMMUNITY): Payer: Self-pay | Admitting: Certified Registered Nurse Anesthetist

## 2016-08-12 ENCOUNTER — Inpatient Hospital Stay (HOSPITAL_COMMUNITY): Payer: Medicare Other | Admitting: Anesthesiology

## 2016-08-12 ENCOUNTER — Inpatient Hospital Stay (HOSPITAL_COMMUNITY)
Admission: RE | Disposition: A | Discharge status: Skilled Nursing Facility | Payer: Self-pay | Source: Ambulatory Visit | Attending: Neurosurgery

## 2016-08-12 ENCOUNTER — Inpatient Hospital Stay (HOSPITAL_COMMUNITY): Payer: Medicare Other | Admitting: Vascular Surgery

## 2016-08-12 ENCOUNTER — Inpatient Hospital Stay (HOSPITAL_COMMUNITY): Payer: Medicare Other

## 2016-08-12 ENCOUNTER — Inpatient Hospital Stay (HOSPITAL_COMMUNITY)
Admission: RE | Admit: 2016-08-12 | Discharge: 2016-08-16 | DRG: 460 | Disposition: A | Discharge status: Skilled Nursing Facility | Payer: Medicare Other | Source: Ambulatory Visit | Attending: Neurosurgery | Admitting: Neurosurgery

## 2016-08-12 DIAGNOSIS — M5416 Radiculopathy, lumbar region: Secondary | ICD-10-CM | POA: Diagnosis not present

## 2016-08-12 DIAGNOSIS — I10 Essential (primary) hypertension: Secondary | ICD-10-CM | POA: Diagnosis present

## 2016-08-12 DIAGNOSIS — M1610 Unilateral primary osteoarthritis, unspecified hip: Secondary | ICD-10-CM | POA: Diagnosis not present

## 2016-08-12 DIAGNOSIS — M8580 Other specified disorders of bone density and structure, unspecified site: Secondary | ICD-10-CM | POA: Diagnosis not present

## 2016-08-12 DIAGNOSIS — Z79899 Other long term (current) drug therapy: Secondary | ICD-10-CM | POA: Diagnosis not present

## 2016-08-12 DIAGNOSIS — E785 Hyperlipidemia, unspecified: Secondary | ICD-10-CM | POA: Diagnosis present

## 2016-08-12 DIAGNOSIS — Z7984 Long term (current) use of oral hypoglycemic drugs: Secondary | ICD-10-CM | POA: Diagnosis not present

## 2016-08-12 DIAGNOSIS — M419 Scoliosis, unspecified: Secondary | ICD-10-CM | POA: Diagnosis present

## 2016-08-12 DIAGNOSIS — K219 Gastro-esophageal reflux disease without esophagitis: Secondary | ICD-10-CM | POA: Diagnosis not present

## 2016-08-12 DIAGNOSIS — M4316 Spondylolisthesis, lumbar region: Principal | ICD-10-CM | POA: Diagnosis present

## 2016-08-12 DIAGNOSIS — M4326 Fusion of spine, lumbar region: Secondary | ICD-10-CM | POA: Diagnosis not present

## 2016-08-12 DIAGNOSIS — Z4782 Encounter for orthopedic aftercare following scoliosis surgery: Secondary | ICD-10-CM | POA: Diagnosis not present

## 2016-08-12 DIAGNOSIS — M48062 Spinal stenosis, lumbar region with neurogenic claudication: Secondary | ICD-10-CM | POA: Diagnosis not present

## 2016-08-12 DIAGNOSIS — D649 Anemia, unspecified: Secondary | ICD-10-CM | POA: Diagnosis not present

## 2016-08-12 DIAGNOSIS — Z86711 Personal history of pulmonary embolism: Secondary | ICD-10-CM

## 2016-08-12 DIAGNOSIS — M545 Low back pain: Secondary | ICD-10-CM | POA: Diagnosis present

## 2016-08-12 DIAGNOSIS — Z86718 Personal history of other venous thrombosis and embolism: Secondary | ICD-10-CM | POA: Diagnosis not present

## 2016-08-12 DIAGNOSIS — Z419 Encounter for procedure for purposes other than remedying health state, unspecified: Secondary | ICD-10-CM

## 2016-08-12 DIAGNOSIS — M4317 Spondylolisthesis, lumbosacral region: Secondary | ICD-10-CM | POA: Diagnosis not present

## 2016-08-12 DIAGNOSIS — M5137 Other intervertebral disc degeneration, lumbosacral region: Secondary | ICD-10-CM | POA: Diagnosis not present

## 2016-08-12 DIAGNOSIS — M41126 Adolescent idiopathic scoliosis, lumbar region: Secondary | ICD-10-CM | POA: Diagnosis not present

## 2016-08-12 DIAGNOSIS — Z7983 Long term (current) use of bisphosphonates: Secondary | ICD-10-CM

## 2016-08-12 DIAGNOSIS — J45909 Unspecified asthma, uncomplicated: Secondary | ICD-10-CM | POA: Diagnosis present

## 2016-08-12 DIAGNOSIS — J309 Allergic rhinitis, unspecified: Secondary | ICD-10-CM | POA: Diagnosis not present

## 2016-08-12 DIAGNOSIS — E1151 Type 2 diabetes mellitus with diabetic peripheral angiopathy without gangrene: Secondary | ICD-10-CM | POA: Diagnosis not present

## 2016-08-12 DIAGNOSIS — E119 Type 2 diabetes mellitus without complications: Secondary | ICD-10-CM | POA: Diagnosis present

## 2016-08-12 DIAGNOSIS — R0989 Other specified symptoms and signs involving the circulatory and respiratory systems: Secondary | ICD-10-CM | POA: Diagnosis not present

## 2016-08-12 DIAGNOSIS — M4801 Spinal stenosis, occipito-atlanto-axial region: Secondary | ICD-10-CM | POA: Diagnosis not present

## 2016-08-12 DIAGNOSIS — M4126 Other idiopathic scoliosis, lumbar region: Secondary | ICD-10-CM | POA: Diagnosis not present

## 2016-08-12 HISTORY — PX: ANTERIOR LUMBAR FUSION: SHX1170

## 2016-08-12 HISTORY — PX: ANTERIOR LAT LUMBAR FUSION: SHX1168

## 2016-08-12 HISTORY — PX: LUMBAR PERCUTANEOUS PEDICLE SCREW 4 LEVEL: SHX6318

## 2016-08-12 HISTORY — PX: ABDOMINAL EXPOSURE: SHX5708

## 2016-08-12 LAB — GLUCOSE, CAPILLARY
GLUCOSE-CAPILLARY: 192 mg/dL — AB (ref 65–99)
Glucose-Capillary: 138 mg/dL — ABNORMAL HIGH (ref 65–99)
Glucose-Capillary: 154 mg/dL — ABNORMAL HIGH (ref 65–99)

## 2016-08-12 SURGERY — ANTERIOR LUMBAR FUSION 1 LEVEL
Anesthesia: General | Site: Spine Lumbar

## 2016-08-12 MED ORDER — ZOLPIDEM TARTRATE 5 MG PO TABS: 5.0000 mg | ORAL_TABLET | Freq: Every evening | ORAL | Status: DC | PRN

## 2016-08-12 MED ORDER — PANTOPRAZOLE SODIUM 40 MG PO TBEC: 40.0000 mg | DELAYED_RELEASE_TABLET | Freq: Every day | ORAL | Status: DC

## 2016-08-12 MED ORDER — PROPOFOL 10 MG/ML IV BOLUS
INTRAVENOUS | Status: AC
  Filled 2016-08-12: qty 20

## 2016-08-12 MED ORDER — PHENYLEPHRINE HCL 10 MG/ML IJ SOLN
INTRAMUSCULAR | Status: DC | PRN
  Administered 2016-08-12: 80 ug via INTRAVENOUS
  Administered 2016-08-12 (×2): 40 ug via INTRAVENOUS
  Administered 2016-08-12 (×2): 80 ug via INTRAVENOUS

## 2016-08-12 MED ORDER — HYDROMORPHONE HCL 1 MG/ML IJ SOLN
INTRAMUSCULAR | Status: AC
  Filled 2016-08-12: qty 0.5

## 2016-08-12 MED ORDER — HYDROCODONE-ACETAMINOPHEN 5-325 MG PO TABS
1.0000 | ORAL_TABLET | Freq: Four times a day (QID) | ORAL | Status: DC | PRN
  Administered 2016-08-13 – 2016-08-16 (×8): 1 via ORAL
  Filled 2016-08-12 (×9): qty 1

## 2016-08-12 MED ORDER — PHENYLEPHRINE 40 MCG/ML (10ML) SYRINGE FOR IV PUSH (FOR BLOOD PRESSURE SUPPORT)
PREFILLED_SYRINGE | INTRAVENOUS | Status: AC
  Filled 2016-08-12: qty 10

## 2016-08-12 MED ORDER — ALBUTEROL SULFATE (2.5 MG/3ML) 0.083% IN NEBU
3.0000 mL | INHALATION_SOLUTION | Freq: Four times a day (QID) | RESPIRATORY_TRACT | Status: DC | PRN
  Administered 2016-08-13: 3 mL via RESPIRATORY_TRACT
  Filled 2016-08-12: qty 3

## 2016-08-12 MED ORDER — POLYETHYLENE GLYCOL 3350 17 G PO PACK
17.0000 g | PACK | Freq: Every day | ORAL | Status: DC
  Administered 2016-08-12 – 2016-08-15 (×4): 17 g via ORAL
  Filled 2016-08-12 (×5): qty 1

## 2016-08-12 MED ORDER — HEMOSTATIC AGENTS (NO CHARGE) OPTIME
TOPICAL | Status: DC | PRN
  Administered 2016-08-12: 1 via TOPICAL

## 2016-08-12 MED ORDER — CHLORHEXIDINE GLUCONATE 4 % EX LIQD: 60.0000 mL | Freq: Once | CUTANEOUS | Status: DC

## 2016-08-12 MED ORDER — EPHEDRINE 5 MG/ML INJ
INTRAVENOUS | Status: AC
  Filled 2016-08-12: qty 10

## 2016-08-12 MED ORDER — LIDOCAINE-EPINEPHRINE 1 %-1:100000 IJ SOLN
INTRAMUSCULAR | Status: DC | PRN
  Administered 2016-08-12: 5 mL
  Administered 2016-08-12: 10 mL

## 2016-08-12 MED ORDER — FENTANYL CITRATE (PF) 100 MCG/2ML IJ SOLN
INTRAMUSCULAR | Status: AC
  Filled 2016-08-12: qty 2

## 2016-08-12 MED ORDER — FLEET ENEMA 7-19 GM/118ML RE ENEM: 1.0000 | ENEMA | Freq: Once | RECTAL | Status: DC | PRN

## 2016-08-12 MED ORDER — HYDROMORPHONE HCL 1 MG/ML IJ SOLN
0.2500 mg | INTRAMUSCULAR | Status: DC | PRN
  Administered 2016-08-12 (×2): 0.25 mg via INTRAVENOUS

## 2016-08-12 MED ORDER — SODIUM CHLORIDE 0.9% FLUSH: 3.0000 mL | INTRAVENOUS | Status: DC | PRN

## 2016-08-12 MED ORDER — FENTANYL CITRATE (PF) 100 MCG/2ML IJ SOLN
INTRAMUSCULAR | Status: DC | PRN
  Administered 2016-08-12: 50 ug via INTRAVENOUS
  Administered 2016-08-12 (×3): 100 ug via INTRAVENOUS
  Administered 2016-08-12 (×2): 50 ug via INTRAVENOUS
  Administered 2016-08-12 (×2): 25 ug via INTRAVENOUS
  Administered 2016-08-12: 50 ug via INTRAVENOUS
  Administered 2016-08-12: 150 ug via INTRAVENOUS

## 2016-08-12 MED ORDER — ROCURONIUM BROMIDE 100 MG/10ML IV SOLN
INTRAVENOUS | Status: DC | PRN
  Administered 2016-08-12: 25 mg via INTRAVENOUS
  Administered 2016-08-12: 40 mg via INTRAVENOUS
  Administered 2016-08-12: 10 mg via INTRAVENOUS

## 2016-08-12 MED ORDER — CEFAZOLIN SODIUM-DEXTROSE 2-4 GM/100ML-% IV SOLN
2.0000 g | INTRAVENOUS | Status: AC
Start: 2016-08-12 — End: 2016-08-12
  Administered 2016-08-12 (×2): 2 g via INTRAVENOUS

## 2016-08-12 MED ORDER — MOMETASONE FURO-FORMOTEROL FUM 200-5 MCG/ACT IN AERO
2.0000 | INHALATION_SPRAY | Freq: Two times a day (BID) | RESPIRATORY_TRACT | Status: DC
  Administered 2016-08-12 – 2016-08-16 (×9): 2 via RESPIRATORY_TRACT
  Filled 2016-08-12: qty 8.8

## 2016-08-12 MED ORDER — FENTANYL CITRATE (PF) 100 MCG/2ML IJ SOLN
INTRAMUSCULAR | Status: AC
  Filled 2016-08-12: qty 4

## 2016-08-12 MED ORDER — BISACODYL 10 MG RE SUPP
10.0000 mg | Freq: Every day | RECTAL | Status: DC | PRN
  Administered 2016-08-15: 10 mg via RECTAL
  Filled 2016-08-12: qty 1

## 2016-08-12 MED ORDER — ONDANSETRON HCL 4 MG/2ML IJ SOLN
4.0000 mg | Freq: Four times a day (QID) | INTRAMUSCULAR | Status: DC | PRN
  Administered 2016-08-14: 4 mg via INTRAVENOUS
  Filled 2016-08-12: qty 2

## 2016-08-12 MED ORDER — BUPIVACAINE HCL (PF) 0.5 % IJ SOLN
INTRAMUSCULAR | Status: AC
  Filled 2016-08-12: qty 30

## 2016-08-12 MED ORDER — ADULT MULTIVITAMIN W/MINERALS CH
1.0000 | ORAL_TABLET | Freq: Every day | ORAL | Status: DC
  Administered 2016-08-12 – 2016-08-16 (×5): 1 via ORAL
  Filled 2016-08-12 (×4): qty 1

## 2016-08-12 MED ORDER — ACETAMINOPHEN 10 MG/ML IV SOLN
INTRAVENOUS | Status: DC | PRN
  Administered 2016-08-12: 1000 mg via INTRAVENOUS

## 2016-08-12 MED ORDER — MEPERIDINE HCL 25 MG/ML IJ SOLN: 6.2500 mg | INTRAMUSCULAR | Status: DC | PRN

## 2016-08-12 MED ORDER — PROPOFOL 10 MG/ML IV BOLUS
INTRAVENOUS | Status: DC | PRN
  Administered 2016-08-12: 50 mg via INTRAVENOUS
  Administered 2016-08-12: 20 mg via INTRAVENOUS
  Administered 2016-08-12: 100 mg via INTRAVENOUS
  Administered 2016-08-12 (×2): 40 mg via INTRAVENOUS
  Administered 2016-08-12: 50 mg via INTRAVENOUS

## 2016-08-12 MED ORDER — SUGAMMADEX SODIUM 200 MG/2ML IV SOLN
INTRAVENOUS | Status: DC | PRN
  Administered 2016-08-12: 120 mg via INTRAVENOUS

## 2016-08-12 MED ORDER — GABAPENTIN 300 MG PO CAPS
300.0000 mg | ORAL_CAPSULE | Freq: Three times a day (TID) | ORAL | Status: DC
  Administered 2016-08-12 – 2016-08-16 (×11): 300 mg via ORAL
  Filled 2016-08-12 (×12): qty 1

## 2016-08-12 MED ORDER — ACETAMINOPHEN 325 MG PO TABS: 650.0000 mg | ORAL_TABLET | ORAL | Status: DC | PRN

## 2016-08-12 MED ORDER — LACTATED RINGERS IV SOLN
INTRAVENOUS | Status: DC | PRN
  Administered 2016-08-12 (×2): via INTRAVENOUS

## 2016-08-12 MED ORDER — KETOROLAC TROMETHAMINE 0.5 % OP SOLN
1.0000 [drp] | Freq: Four times a day (QID) | OPHTHALMIC | Status: DC
  Administered 2016-08-12 – 2016-08-16 (×14): 1 [drp] via OPHTHALMIC
  Filled 2016-08-12: qty 3

## 2016-08-12 MED ORDER — LIDOCAINE 2% (20 MG/ML) 5 ML SYRINGE
INTRAMUSCULAR | Status: AC
  Filled 2016-08-12: qty 5

## 2016-08-12 MED ORDER — SODIUM CHLORIDE 0.9 % IV SOLN: 250.0000 mL | INTRAVENOUS | Status: DC

## 2016-08-12 MED ORDER — ROCURONIUM BROMIDE 50 MG/5ML IV SOSY
PREFILLED_SYRINGE | INTRAVENOUS | Status: AC
  Filled 2016-08-12: qty 5

## 2016-08-12 MED ORDER — LIDOCAINE-EPINEPHRINE 2 %-1:100000 IJ SOLN
INTRAMUSCULAR | Status: AC
  Filled 2016-08-12: qty 1

## 2016-08-12 MED ORDER — KCL IN DEXTROSE-NACL 20-5-0.45 MEQ/L-%-% IV SOLN
INTRAVENOUS | Status: DC
  Administered 2016-08-13: via INTRAVENOUS
  Filled 2016-08-12 (×2): qty 1000

## 2016-08-12 MED ORDER — ALUM & MAG HYDROXIDE-SIMETH 200-200-20 MG/5ML PO SUSP: 30.0000 mL | Freq: Four times a day (QID) | ORAL | Status: DC | PRN

## 2016-08-12 MED ORDER — OXYCODONE HCL 5 MG PO TABS
5.0000 mg | ORAL_TABLET | ORAL | Status: DC | PRN
  Administered 2016-08-12 – 2016-08-15 (×8): 10 mg via ORAL
  Filled 2016-08-12 (×8): qty 2

## 2016-08-12 MED ORDER — LABETALOL HCL 5 MG/ML IV SOLN
INTRAVENOUS | Status: AC
  Filled 2016-08-12: qty 4

## 2016-08-12 MED ORDER — PROMETHAZINE HCL 25 MG/ML IJ SOLN: 6.2500 mg | INTRAMUSCULAR | Status: DC | PRN

## 2016-08-12 MED ORDER — MENTHOL 3 MG MT LOZG: 1.0000 | LOZENGE | OROMUCOSAL | Status: DC | PRN

## 2016-08-12 MED ORDER — CEFAZOLIN SODIUM-DEXTROSE 2-4 GM/100ML-% IV SOLN
INTRAVENOUS | Status: AC
  Filled 2016-08-12: qty 100

## 2016-08-12 MED ORDER — LABETALOL HCL 5 MG/ML IV SOLN
INTRAVENOUS | Status: DC | PRN
  Administered 2016-08-12 (×3): 5 mg via INTRAVENOUS

## 2016-08-12 MED ORDER — METFORMIN HCL ER 500 MG PO TB24
500.0000 mg | ORAL_TABLET | Freq: Every day | ORAL | Status: DC
  Administered 2016-08-13 – 2016-08-16 (×4): 500 mg via ORAL
  Filled 2016-08-12 (×4): qty 1

## 2016-08-12 MED ORDER — HYDROMORPHONE HCL 1 MG/ML IJ SOLN
0.5000 mg | INTRAMUSCULAR | Status: DC | PRN
  Administered 2016-08-12 – 2016-08-15 (×11): 1 mg via INTRAVENOUS
  Filled 2016-08-12 (×11): qty 1

## 2016-08-12 MED ORDER — MIRABEGRON ER 25 MG PO TB24
50.0000 mg | ORAL_TABLET | Freq: Every day | ORAL | Status: DC
  Administered 2016-08-13 – 2016-08-16 (×4): 50 mg via ORAL
  Filled 2016-08-12 (×4): qty 2

## 2016-08-12 MED ORDER — ONDANSETRON HCL 4 MG/2ML IJ SOLN
INTRAMUSCULAR | Status: AC
  Filled 2016-08-12: qty 2

## 2016-08-12 MED ORDER — DEXAMETHASONE SODIUM PHOSPHATE 10 MG/ML IJ SOLN
INTRAMUSCULAR | Status: DC | PRN
  Administered 2016-08-12: 5 mg via INTRAVENOUS

## 2016-08-12 MED ORDER — ACETAMINOPHEN 650 MG RE SUPP: 650.0000 mg | RECTAL | Status: DC | PRN

## 2016-08-12 MED ORDER — ESMOLOL HCL 100 MG/10ML IV SOLN
INTRAVENOUS | Status: AC
  Filled 2016-08-12: qty 20

## 2016-08-12 MED ORDER — CHLORHEXIDINE GLUCONATE CLOTH 2 % EX PADS: 6.0000 | MEDICATED_PAD | Freq: Once | CUTANEOUS | Status: DC

## 2016-08-12 MED ORDER — SUGAMMADEX SODIUM 200 MG/2ML IV SOLN
INTRAVENOUS | Status: AC
  Filled 2016-08-12: qty 2

## 2016-08-12 MED ORDER — ONDANSETRON HCL 4 MG PO TABS: 4.0000 mg | ORAL_TABLET | Freq: Four times a day (QID) | ORAL | Status: DC | PRN

## 2016-08-12 MED ORDER — PHENOL 1.4 % MT LIQD: 1.0000 | OROMUCOSAL | Status: DC | PRN

## 2016-08-12 MED ORDER — ALENDRONATE SODIUM 70 MG PO TABS
70.0000 mg | ORAL_TABLET | ORAL | Status: DC
Start: 2016-08-12 — End: 2016-08-12

## 2016-08-12 MED ORDER — PANTOPRAZOLE SODIUM 40 MG IV SOLR
40.0000 mg | Freq: Every day | INTRAVENOUS | Status: DC
  Administered 2016-08-12 – 2016-08-13 (×2): 40 mg via INTRAVENOUS
  Filled 2016-08-12 (×2): qty 40

## 2016-08-12 MED ORDER — PROSIGHT PO TABS
1.0000 | ORAL_TABLET | Freq: Every day | ORAL | Status: DC
  Administered 2016-08-12 – 2016-08-16 (×5): 1 via ORAL
  Filled 2016-08-12 (×6): qty 1

## 2016-08-12 MED ORDER — THROMBIN 5000 UNITS EX SOLR
CUTANEOUS | Status: AC
  Filled 2016-08-12: qty 10000

## 2016-08-12 MED ORDER — BUPIVACAINE HCL (PF) 0.5 % IJ SOLN
INTRAMUSCULAR | Status: DC | PRN
  Administered 2016-08-12: 10 mL
  Administered 2016-08-12: 5 mL

## 2016-08-12 MED ORDER — 0.9 % SODIUM CHLORIDE (POUR BTL) OPTIME
TOPICAL | Status: DC | PRN
  Administered 2016-08-12: 1000 mL

## 2016-08-12 MED ORDER — PHENYLEPHRINE HCL 10 MG/ML IJ SOLN
INTRAVENOUS | Status: DC | PRN
  Administered 2016-08-12: 25 ug/min via INTRAVENOUS

## 2016-08-12 MED ORDER — ONDANSETRON HCL 4 MG/2ML IJ SOLN
INTRAMUSCULAR | Status: DC | PRN
  Administered 2016-08-12: 4 mg via INTRAVENOUS

## 2016-08-12 MED ORDER — ARTIFICIAL TEARS OP OINT
TOPICAL_OINTMENT | OPHTHALMIC | Status: AC
  Filled 2016-08-12: qty 3.5

## 2016-08-12 MED ORDER — MIDAZOLAM HCL 2 MG/2ML IJ SOLN: 0.5000 mg | Freq: Once | INTRAMUSCULAR | Status: DC | PRN

## 2016-08-12 MED ORDER — DOCUSATE SODIUM 100 MG PO CAPS
100.0000 mg | ORAL_CAPSULE | Freq: Two times a day (BID) | ORAL | Status: DC
  Administered 2016-08-12 – 2016-08-16 (×8): 100 mg via ORAL
  Filled 2016-08-12 (×8): qty 1

## 2016-08-12 MED ORDER — SUCCINYLCHOLINE CHLORIDE 200 MG/10ML IV SOSY
PREFILLED_SYRINGE | INTRAVENOUS | Status: AC
  Filled 2016-08-12: qty 10

## 2016-08-12 MED ORDER — ACETAMINOPHEN 500 MG PO TABS
1000.0000 mg | ORAL_TABLET | Freq: Four times a day (QID) | ORAL | Status: AC
  Administered 2016-08-12 – 2016-08-13 (×2): 1000 mg via ORAL
  Filled 2016-08-12 (×2): qty 2

## 2016-08-12 MED ORDER — POLYETHYLENE GLYCOL 3350 17 G PO PACK: 17.0000 g | PACK | Freq: Every day | ORAL | Status: DC | PRN

## 2016-08-12 MED ORDER — DIAZEPAM 5 MG PO TABS: 5.0000 mg | ORAL_TABLET | Freq: Four times a day (QID) | ORAL | Status: DC | PRN

## 2016-08-12 MED ORDER — ARTIFICIAL TEARS OP OINT
TOPICAL_OINTMENT | OPHTHALMIC | Status: DC | PRN
  Administered 2016-08-12: 1 via OPHTHALMIC

## 2016-08-12 MED ORDER — ASPIRIN 81 MG PO CHEW
81.0000 mg | CHEWABLE_TABLET | Freq: Every day | ORAL | Status: DC
  Administered 2016-08-12 – 2016-08-16 (×5): 81 mg via ORAL
  Filled 2016-08-12 (×5): qty 1

## 2016-08-12 MED ORDER — CEFAZOLIN SODIUM-DEXTROSE 2-4 GM/100ML-% IV SOLN
2.0000 g | Freq: Three times a day (TID) | INTRAVENOUS | Status: AC
  Administered 2016-08-12 – 2016-08-13 (×2): 2 g via INTRAVENOUS
  Filled 2016-08-12 (×2): qty 100

## 2016-08-12 MED ORDER — SODIUM CHLORIDE 0.9% FLUSH
3.0000 mL | Freq: Two times a day (BID) | INTRAVENOUS | Status: DC
  Administered 2016-08-12 – 2016-08-16 (×7): 3 mL via INTRAVENOUS

## 2016-08-12 MED ORDER — THROMBIN 5000 UNITS EX SOLR
CUTANEOUS | Status: DC | PRN
  Administered 2016-08-12 (×2): 5000 [IU] via TOPICAL

## 2016-08-12 MED ORDER — ACETAMINOPHEN 10 MG/ML IV SOLN
INTRAVENOUS | Status: AC
  Filled 2016-08-12: qty 100

## 2016-08-12 MED ORDER — LACTATED RINGERS IV SOLN
INTRAVENOUS | Status: DC | PRN
  Administered 2016-08-12 (×3): via INTRAVENOUS

## 2016-08-12 MED ORDER — DOCUSATE SODIUM 100 MG PO CAPS: 100.0000 mg | ORAL_CAPSULE | Freq: Every morning | ORAL | Status: DC

## 2016-08-12 MED ORDER — VITAMIN D 1000 UNITS PO TABS
1000.0000 [IU] | ORAL_TABLET | Freq: Every day | ORAL | Status: DC
  Administered 2016-08-12 – 2016-08-16 (×5): 1000 [IU] via ORAL
  Filled 2016-08-12 (×5): qty 1

## 2016-08-12 MED ORDER — CYCLOBENZAPRINE HCL 10 MG PO TABS
5.0000 mg | ORAL_TABLET | Freq: Three times a day (TID) | ORAL | Status: DC | PRN
  Administered 2016-08-13 – 2016-08-16 (×9): 5 mg via ORAL
  Filled 2016-08-12 (×9): qty 1

## 2016-08-12 MED ORDER — EPHEDRINE SULFATE 50 MG/ML IJ SOLN
INTRAMUSCULAR | Status: DC | PRN
Start: 2016-08-12 — End: 2016-08-12
  Administered 2016-08-12 (×2): 10 mg via INTRAVENOUS

## 2016-08-12 MED FILL — Sodium Chloride IV Soln 0.9%: INTRAVENOUS | Qty: 1000 | Status: AC

## 2016-08-12 MED FILL — Heparin Sodium (Porcine) Inj 1000 Unit/ML: INTRAMUSCULAR | Qty: 30 | Status: AC

## 2016-08-12 SURGICAL SUPPLY — 88 items
ADH SKN CLS APL DERMABOND .7 (GAUZE/BANDAGES/DRESSINGS) ×6
APPLIER CLIP 11 MED OPEN (CLIP) ×4
APR CLP MED 11 20 MLT OPN (CLIP) ×2
BASE TI IMPLANT 8X38X28 15DEG (Neuro Prosthesis/Implant) ×1 IMPLANT
BOLT BASE TI 5X17.5 VARIABLE (Bolt) ×3 IMPLANT
BOLT BASE TI 5X20 VARIABLE (Bolt) ×6 IMPLANT
CANISTER SUCT 3000ML PPV (MISCELLANEOUS) ×4 IMPLANT
CLIP APPLIE 11 MED OPEN (CLIP) ×2 IMPLANT
CLIP NEUROVISION LG (CLIP) ×2 IMPLANT
CONT SPEC 4OZ CLIKSEAL STRL BL (MISCELLANEOUS) ×4 IMPLANT
COVER BACK TABLE 60X90IN (DRAPES) ×4 IMPLANT
DERMABOND ADVANCED (GAUZE/BANDAGES/DRESSINGS) ×6
DERMABOND ADVANCED .7 DNX12 (GAUZE/BANDAGES/DRESSINGS) ×2 IMPLANT
DRAPE C-ARM 42X72 X-RAY (DRAPES) ×6 IMPLANT
DRAPE C-ARMOR (DRAPES) ×8 IMPLANT
DRAPE LAPAROTOMY 100X72X124 (DRAPES) ×8 IMPLANT
DRAPE POUCH INSTRU U-SHP 10X18 (DRAPES) ×8 IMPLANT
DRSG OPSITE POSTOP 3X4 (GAUZE/BANDAGES/DRESSINGS) ×2 IMPLANT
DRSG OPSITE POSTOP 4X6 (GAUZE/BANDAGES/DRESSINGS) ×4 IMPLANT
DRSG OPSITE POSTOP 4X8 (GAUZE/BANDAGES/DRESSINGS) ×4 IMPLANT
DURAPREP 26ML APPLICATOR (WOUND CARE) ×10 IMPLANT
ELECT BLADE 4.0 EZ CLEAN MEGAD (MISCELLANEOUS) ×4
ELECT REM PT RETURN 9FT ADLT (ELECTROSURGICAL) ×12
ELECTRODE BLDE 4.0 EZ CLN MEGD (MISCELLANEOUS) ×2 IMPLANT
ELECTRODE REM PT RTRN 9FT ADLT (ELECTROSURGICAL) ×4 IMPLANT
GLOVE BIO SURGEON STRL SZ8 (GLOVE) ×13 IMPLANT
GLOVE BIOGEL PI IND STRL 7.5 (GLOVE) IMPLANT
GLOVE BIOGEL PI IND STRL 8 (GLOVE) ×5 IMPLANT
GLOVE BIOGEL PI IND STRL 8.5 (GLOVE) ×5 IMPLANT
GLOVE BIOGEL PI INDICATOR 7.5 (GLOVE) ×8
GLOVE BIOGEL PI INDICATOR 8 (GLOVE) ×8
GLOVE BIOGEL PI INDICATOR 8.5 (GLOVE) ×8
GLOVE ECLIPSE 7.0 STRL STRAW (GLOVE) ×6 IMPLANT
GLOVE ECLIPSE 7.5 STRL STRAW (GLOVE) ×6 IMPLANT
GLOVE ECLIPSE 8.0 STRL XLNG CF (GLOVE) ×13 IMPLANT
GLOVE SS BIOGEL STRL SZ 7.5 (GLOVE) ×2 IMPLANT
GLOVE SUPERSENSE BIOGEL SZ 7.5 (GLOVE) ×4
GOWN STRL REUS W/ TWL LRG LVL3 (GOWN DISPOSABLE) IMPLANT
GOWN STRL REUS W/ TWL XL LVL3 (GOWN DISPOSABLE) ×4 IMPLANT
GOWN STRL REUS W/TWL 2XL LVL3 (GOWN DISPOSABLE) ×10 IMPLANT
GOWN STRL REUS W/TWL LRG LVL3 (GOWN DISPOSABLE) ×4
GOWN STRL REUS W/TWL XL LVL3 (GOWN DISPOSABLE) ×12
GUIDEWIRE NITINOL BEVEL TIP (WIRE) ×30 IMPLANT
KIT BASIN OR (CUSTOM PROCEDURE TRAY) ×8 IMPLANT
KIT DILATOR XLIF 5 (KITS) IMPLANT
KIT INFUSE SMALL (Orthopedic Implant) ×2 IMPLANT
KIT INFUSE XX SMALL 0.7CC (Orthopedic Implant) ×3 IMPLANT
KIT POSITION SURG JACKSON T1 (MISCELLANEOUS) ×4 IMPLANT
KIT ROOM TURNOVER OR (KITS) ×11 IMPLANT
KIT SURGICAL ACCESS MAXCESS 4 (KITS) ×3 IMPLANT
KIT XLIF (KITS) ×2
MARKER SKIN DUAL TIP RULER LAB (MISCELLANEOUS) ×6 IMPLANT
MODULE NVM5 NEXT GEN EMG (NEEDLE) ×2 IMPLANT
MODULUS XLW 10X22X50MM 10DEG (Spine Construct) ×3 IMPLANT
MODULUS XLW 10X22X55MM 10 (Spine Construct) ×2 IMPLANT
NDL HYPO 25X1 1.5 SAFETY (NEEDLE) ×2 IMPLANT
NDL I PASS (NEEDLE) IMPLANT
NDL SPNL 18GX3.5 QUINCKE PK (NEEDLE) ×2 IMPLANT
NEEDLE HYPO 25X1 1.5 SAFETY (NEEDLE) ×12 IMPLANT
NEEDLE I PASS (NEEDLE) ×4 IMPLANT
NEEDLE SPNL 18GX3.5 QUINCKE PK (NEEDLE) ×4 IMPLANT
NS IRRIG 1000ML POUR BTL (IV SOLUTION) ×8 IMPLANT
PACK LAMINECTOMY NEURO (CUSTOM PROCEDURE TRAY) ×12 IMPLANT
PAD ARMBOARD 7.5X6 YLW CONV (MISCELLANEOUS) ×12 IMPLANT
PUTTY BONE ATTRAX 10CC STRIP (Putty) ×6 IMPLANT
ROD RELINE MAS LORD 5.5X120MM (Rod) ×4 IMPLANT
SCREW LOCK RELINE 5.5 TULIP (Screw) ×36 IMPLANT
SCREW MAS RELINE 6.5X45 POLY (Screw) ×10 IMPLANT
SCREW MAS RELINE POLY 6.5X40 (Screw) ×12 IMPLANT
SCREW RELINE MAS POLY 5.5X40 (Screw) ×2 IMPLANT
SCREW SHANK MAS MOD 6.5X40MM (Screw) ×3 IMPLANT
SPINE TULIP RELINE MOD (Neuro Prosthesis/Implant) ×4 IMPLANT
SPONGE LAP 18X18 X RAY DECT (DISPOSABLE) ×4 IMPLANT
SPONGE SURGIFOAM ABS GEL SZ50 (HEMOSTASIS) ×4 IMPLANT
STAPLER SKIN PROX WIDE 3.9 (STAPLE) ×2 IMPLANT
SUT PDS AB 1 CTX 36 (SUTURE) ×2 IMPLANT
SUT SILK 2 0 TIES 10X30 (SUTURE) ×4 IMPLANT
SUT SILK 3 0 TIES 10X30 (SUTURE) ×4 IMPLANT
SUT VIC AB 1 CT1 18XBRD ANBCTR (SUTURE) ×4 IMPLANT
SUT VIC AB 1 CT1 8-18 (SUTURE) ×16
SUT VIC AB 2-0 CT1 36 (SUTURE) ×12 IMPLANT
SUT VIC AB 3-0 SH 27 (SUTURE) ×28
SUT VIC AB 3-0 SH 27X BRD (SUTURE) ×2 IMPLANT
TOWEL GREEN STERILE (TOWEL DISPOSABLE) ×6 IMPLANT
TOWEL GREEN STERILE FF (TOWEL DISPOSABLE) ×3 IMPLANT
TRAY FOLEY W/METER SILVER 16FR (SET/KITS/TRAYS/PACK) ×4 IMPLANT
TULIP SPINE RELINE MOD (Neuro Prosthesis/Implant) ×1 IMPLANT
WATER STERILE IRR 1000ML POUR (IV SOLUTION) ×4 IMPLANT

## 2016-08-12 NOTE — Anesthesia Procedure Notes (Signed)
Arterial Line Insertion Start/End3/16/2018 8:14 AM Performed by: Annye Asa, Porsha Skilton L, CRNA  Preanesthetic checklist: patient identified, IV checked, site marked, risks and benefits discussed, surgical consent, monitors and equipment checked, pre-op evaluation, timeout performed and anesthesia consent Left, radial was placed Catheter size: 20 Fr Hand hygiene performed , maximum sterile barriers used  and Seldinger technique used Allen's test indicative of satisfactory collateral circulation Attempts: 3 Procedure performed without using ultrasound guided technique. Following insertion, dressing applied. Patient tolerated the procedure well with no immediate complications.

## 2016-08-12 NOTE — Interval H&P Note (Signed)
History and Physical Interval Note:  08/12/2016 7:21 AM  [name]  has presented today for surgery, with the diagnosis of Spinal stenosis of lumbar region with neurogenic claudication  The various methods of treatment have been discussed with the patient and family. After consideration of risks, benefits and other options for treatment, the patient has consented to  Procedure(s) with comments: L5-S1 Anterior lumbar interbody fusion with Dr. Sherren Mocha Early to assist (N/A) - L5-S1 Anterior lumbar interbody fusion with Dr. Sherren Mocha Early to assist L2-3 L3-4 L4-5 Anterolateral lumbar interbody fusion (N/A) - L2-3 L3-4 L4-5 Anterolateral lumbar interbody fusion L2-S1 Percuataneous Pedicle Screws (N/A) ABDOMINAL EXPOSURE (N/A) as a surgical intervention .  The patient's history has been reviewed, patient examined, no change in status, stable for surgery.  I have reviewed the patient's chart and labs.  Questions were answered to the patient's satisfaction.     Cleo Villamizar D

## 2016-08-12 NOTE — Anesthesia Procedure Notes (Signed)
Procedure Name: Intubation Date/Time: 08/12/2016 7:40 AM Performed by: Rejeana Brock L Pre-anesthesia Checklist: Patient identified, Emergency Drugs available, Suction available, Patient being monitored and Timeout performed Patient Re-evaluated:Patient Re-evaluated prior to inductionOxygen Delivery Method: Circle system utilized Preoxygenation: Pre-oxygenation with 100% oxygen Intubation Type: IV induction Ventilation: Mask ventilation without difficulty Laryngoscope Size: Glidescope and 3 Grade View: Grade I Tube type: Oral Tube size: 7.0 mm Number of attempts: 1 Intubation method: glidescope stylet. Placement Confirmation: ETT inserted through vocal cords under direct vision and positive ETCO2 Secured at: 20 cm Tube secured with: Tape Dental Injury: Teeth and Oropharynx as per pre-operative assessment

## 2016-08-12 NOTE — Op Note (Signed)
    OPERATIVE REPORT  DATE OF SURGERY: 08/12/2016  PATIENT: [name], 76 y.o. female MRN: [id number]  DOB: Nov 25, 1940  PRE-OPERATIVE DIAGNOSIS: Degenerative disc disease  POST-OPERATIVE DIAGNOSIS:  Same  PROCEDURE: Exposure for L5-S1 lumbar fusion  SURGEON:  Curt Jews, M.D.  Co-Surgeon for the exposure Dr. Dierdre Harness  Assistant Aaron Edelman Poteet RN  ANESTHESIA:  Gen.  EBL: Minimal ml  Total I/O In: 2100 [I.V.:2100] Out: 610 [Urine:600; Blood:10]  BLOOD ADMINISTERED: None  DRAINS: None  SPECIMEN: None  COUNTS CORRECT:  YES  PLAN OF CARE: PACU   PATIENT DISPOSITION:  PACU - hemodynamically stable  PROCEDURE DETAILS: The patient was taken to the operating room placed supine position where the area of the abdomen was prepped and draped in usual sterile fashion. C-arm was used to determine the level of the L5-S1 disc related to the skin. An incision was made from the midline laterally to the left and carried down to the subcutaneous fat with electrocautery. The anterior rectus sheath was opened in line with the skin incision with electrocautery. The rectus muscle was mobilized circumferentially. Retrogradewas entered and the left lower 100 and dissection was continued up to the level of the semilunar line. The posterior rectus sheath was opened laterally and the intraperitoneal contents were mobilized to the right. The left ureter was identified and was mobilized to the right. The iliac vessels were identified overlying the L5-S1 disc. These were mobilized bluntly. Middle sacral vessels were clipped and divided. Blunt dissection was used to give adequate exposure to the right and left side of the L5-S1 disc and inferior and superior exposure. The Thompson retractor was brought onto the field and the reverse lip 150 blades were positioned to the right and left of the L5-S1 disc. Malleable retractors were used for superior and inferior exposure. C-arm was brought back onto the field to  confirm that this was the L5-S1 disc. The remainder the procedure will be dictated as a separate note with Dr. Gari Crown, M.D., [facility] 08/12/2016 11:54 AM

## 2016-08-12 NOTE — Progress Notes (Signed)
Pt admitted to the unit from pacu; A&O x4; MAE x4; foley intact and unclamped; IV intact and transfusing; skin clean, dry and intact with no pressure ulcers or open wounds noted except for 4 honeycomb dsg to abd, left lateral side and back all clean, dry and intact with no stain or drainage noted. Pt oriented to the unit and room; bed alarm on, call light within reach; fall/safety precaution and instructions completed with pt and family at bedside. Reported off to pt's assigned RN Tammy. Delia Heady RN

## 2016-08-12 NOTE — Progress Notes (Signed)
PHARMACIST - PHYSICIAN COMMUNICATION  CONCERNING: P&T Medication Policy Regarding Oral Bisphosphonates  RECOMMENDATION: Your order for alendronate (Fosamax), ibandronate (Boniva), or risedronate (Actonel) has been discontinued at this time.  If the patient's post-hospital medical condition warrants safe use of this class of drugs, please resume the pre-hospital regimen upon discharge.  DESCRIPTION:  Alendronate (Fosamax), ibandronate (Boniva), and risedronate (Actonel) can cause severe esophageal erosions in patients who are unable to remain upright at least 30 minutes after taking this medication.   Since brief interruptions in therapy are thought to have minimal impact on bone mineral density, the [place] has established that bisphosphonate orders should be routinely discontinued during hospitalization.   To override this safety policy and permit administration of Boniva, Fosamax, or Actonel in the hospital, prescribers must write "DO NOT HOLD" in the comments section when placing the order for this class of medications.    Emran Molzahn D. Mina Marble, PharmD, BCPS Pager:  [phone] 08/12/2016, 6:19 PM

## 2016-08-12 NOTE — Progress Notes (Signed)
Awake, alert, conversant.  MAEW with good strength.  No numbness.  Pain is well controlled.  Dressings CDI.  Doing well.

## 2016-08-12 NOTE — Brief Op Note (Signed)
08/12/2016  3:48 PM  PATIENT:  [name]  76 y.o. female  PRE-OPERATIVE DIAGNOSIS:  Spinal stenosis of lumbar region with neurogenic claudication, spondylolisthesis, scoliosis, radiculopathy L 23, L 34, L 45, L 5 S1 levels  POST-OPERATIVE DIAGNOSIS:  Spinal stenosis of lumbar region with neurogenic claudication, spondylolisthesis, scoliosis, radiculopathy L 23, L 34, L 45, L 5 S1 levels  PROCEDURE:  Procedure(s) with comments: Lumbar Five-Sacral One Anterior lumbar interbody fusion with Dr. Sherren Mocha Early to assist (N/A) - L5-S1 Anterior lumbar interbody fusion with Dr. Sherren Mocha Early to assist LUMBAR TWO-THREE, LUMBAR THREE-FOUR, LUMBAR FOUR-FIVE  ANTEROLATERAL LUMBAR INTERBODY FUSION (N/A) - L2-3 L3-4 L4-5 Anterolateral lumbar interbody fusion LUMBAR TWO-SACRAL ONE Percuataneous Pedicle Screws (N/A) ABDOMINAL EXPOSURE (N/A)  SURGEON:  Surgeon(s) and Role: Panel 1:    * Erline Levine, MD - Primary    * Kary Kos, MD - Assisting  Panel 2:    * Rosetta Posner, MD - Primary  PHYSICIAN ASSISTANT:   ASSISTANTS: Poteat, RN   ANESTHESIA:   general  EBL:  Total I/O In: 3200 [I.V.:3200] Out: 920 [Urine:760; Blood:160]  BLOOD ADMINISTERED:none  DRAINS: none   LOCAL MEDICATIONS USED:  MARCAINE    and LIDOCAINE   SPECIMEN:  No Specimen  DISPOSITION OF SPECIMEN:  N/A  COUNTS:  YES  TOURNIQUET:  * No tourniquets in log *  DICTATION: DICTATION:   INDICATIONS:  Pateint is 76 year old female with chronic and intractable back and bilateral lower extremity pain, left greater than right.  She has dextroconvex lumbar scoliosis and L 5 S 1 anterolisthesis with lateral foraminal stenosis L 23, L 3, L 45 levels, left greater than right.   It was elected to take her to surgery for anterior lumbar decompression and fusion at the L 23, L 34, L45, L 5 S1 levels.   PROCEDURE:  Doctor Early performed exposure and his portion of the procedure will be dictated separately.  Upon exposing the L 5 S1 level,  a localizing X ray was obtained with the C arm.  I then incised the anterior annulus and performed a thorough discectomy with wide ligamentous releases.  The endplates were cleared of disc and cartilagenous material and a thorough discectomy was performed with decompression of the ventral annulus and disc material.  After trials, a 15 degree, 8 x 38 x 28 Base titanium ALIF cage was placed and lagged with 2, 5.0 x 20 mm screws in S 1 and one in L 5 ( 5.0 x 17.5 mm). This spacer was packed with extra extra small BMP and Attrax.  The implant was tamped into position and positioning was confirmed with C arm.   Locking mechanisms were engaged, soft tissues were inspected and found to be in good repair.   Fascia was closed with 1 PDS running stitch, skin edges closed with 2-0 and 3-0 vicryl sutures.  Wound was dressed with a sterile occlusive dressing.    Patient was then  placed in a right lateral decubitus position on the operative table and using orthogonally projected C-arm fluoroscopy the patient was placed so that the L 45 levels were visualized in AP and lateral plane. The patient was then taped into position.  Skin was marked along with a posterior finger dissection incision. Her flank was then prepped and draped in usual sterile fashion and incisions were made sequentially at L 45 level along the iliac crest and then at L3  working between the 11 th and 12 th ribs. Finger  dissection was made to enter the retroperitoneal space and then subsequently the probe was inserted into the psoas muscle from the left side initially at the L45 level. After mapping the neural elements were able to dock the probe per the midpoint of this vertebral level and without indications electrically of too close proximity to the neural tissues. Subsequently the self-retaining tractor was.after sequential dilators were utilized the shim was employed and the interspace was cleared of psoas muscle and then incised. A thorough discectomy  was performed. Instruments were used to clear the interspace of disc material. After thorough discectomy was performed and this was performed using AP and lateral fluoroscopy a 10 lordotic by 55 x 22 mm Modulus titanium implant was packed with small BMP and Attrax. This was tamped into position using the slides and its position was confirmed on AP and lateral fluoroscopy.  Hemostasis was assured. Finger dissection was made to enter the retroperitoneal space and then subsequently the probe was inserted into the psoas muscle from the left side initially at the L34 level. After mapping the neural elements were able to dock the probe per the midpoint of this vertebral level and without indications electrically of too close proximity to the neural tissues. Subsequently the self-retaining tractor was.after sequential dilators were utilized the shim was employed and the interspace was cleared of psoas muscle and then incised. A thorough discectomy was performed. Instruments were used to clear the interspace of disc material. After thorough discectomy was performed and this was performed using AP and lateral fluoroscopy a 10 lordotic by 55 x 22 mm implant was packed with remaining BMP and Attrax. This was tamped into position using the slides and its position was confirmed on AP and lateral fluoroscopy.  Hemostasis was assured Finger dissection was made to enter the retroperitoneal space and then subsequently the probe was inserted into the psoas muscle from the left side  at the L23 level. After mapping the neural elements were able to dock the probe per the midpoint of this vertebral level and without indications electrically of too close proximity to the neural tissues. Subsequently the self-retaining tractor was.after sequential dilators were utilized the shim was employed and the interspace was cleared of psoas muscle and then incised. A thorough discectomy was performed. Instruments were used to clear the interspace  of disc material. After thorough discectomy was performed and this was performed using AP and lateral fluoroscopy a 10 lordotic by 50 x 22 mm implant was packed with remaining BMP and Attrax. This was tamped into position using the slides and its position was confirmed on AP and lateral fluoroscopy.  Hemostasis was assured at each level.  Incisions were closed with 0, 2-0, 3-0 vicryl sutures and dressed with Dermabond and an occlusive dressing.  The patient was then turned into a prone position on the [place] table on chest rolls and using AP and lateral fluoroscopy throughout this portion of the procedure, pedicle screws were placed using Nuvasive cannulated percutaneous screws. After placing guide wires at each level with the use of nerve monitoring throughout, Nuvasive Reline towers were docked on the L2, L 3, L 4, L 5 S 1 levels.  Pedicle screws were placed bilaterally at L 2 (6.5 x 40 right and 5.5 x 40 left), 2 at L 3 (6.5 x 40), 2 at L 4 and  L 5 (6.5 x 45) and S1 (6.5 x 40 bilaterally). 120 mm rods were then affixed to the screw heads through a separate stab incision  and locked down on the screws. All connections were then torqued and the Towers were disassembled. The wounds were irrigated and then closed with 1, 2-0 and 3-0 Vicryl stitches.   Sterile occlusive dressing was placed with Dermabond. The patient was then extubated in the operating room and taken to recovery in stable and satisfactory condition having tolerated his operation well. Counts were correct at the end of the case.  Sterile occlusive dressing was placed with Dermabond. The patient was then extubated in the operating room and taken to recovery in stable and satisfactory condition having tolerated his operation well. Counts were correct at the end of the case.  Patient was extubated in the OR and taken to recovery having tolerated her surgery well.  Counts were correct.    Retractor Times: L 23 (16:55mins); L 34 (16:14 mins); L 45  (22:05 mins).  Pelvic Parameters:  Preop:  PT 1.4 degrees; PI 58.9 degrees; LL -67; PI- LL -8.1 degree; SVA 6.8 mm;     PLAN OF CARE: Admit to inpatient   PATIENT DISPOSITION:  PACU - hemodynamically stable.   Delay start of Pharmacological VTE agent (>24hrs) due to surgical blood loss or risk of bleeding: yes

## 2016-08-12 NOTE — Anesthesia Postprocedure Evaluation (Addendum)
Anesthesia Post Note  Patient: [name]  Procedure(s) Performed: Procedure(s) (LRB): Lumbar Five-Sacral One Anterior lumbar interbody fusion with Dr. Sherren Mocha Early to assist (N/A) LUMBAR TWO-THREE, LUMBAR THREE-FOUR, LUMBAR FOUR-FIVE  ANTEROLATERAL LUMBAR INTERBODY FUSION (N/A) LUMBAR TWO-SACRAL ONE Percuataneous Pedicle Screws (N/A) ABDOMINAL EXPOSURE (N/A)  Patient location during evaluation: PACU Anesthesia Type: General Level of consciousness: awake and alert, patient cooperative and oriented Pain management: pain level controlled Vital Signs Assessment: post-procedure vital signs reviewed and stable Respiratory status: spontaneous breathing, nonlabored ventilation, respiratory function stable and patient connected to nasal cannula oxygen Cardiovascular status: blood pressure returned to baseline and stable Postop Assessment: no signs of nausea or vomiting Comments: Pt C/O R eye pain, no eye injury obvious, ketorolac gtts ordered.          Last Vitals:  Vitals:   08/12/16 1651 08/12/16 1659  BP: (!) 175/92 (!) 166/86  Pulse: (!) 58 61  Resp: 12 13  Temp:      Last Pain:  Vitals:   08/12/16 1543  TempSrc:   PainSc: Asleep                 Ravindra Baranek,E. Kenzli Barritt

## 2016-08-12 NOTE — Op Note (Signed)
08/12/2016  3:48 PM  PATIENT:  [name]  76 y.o. female  PRE-OPERATIVE DIAGNOSIS:  Spinal stenosis of lumbar region with neurogenic claudication, spondylolisthesis, scoliosis, radiculopathy L 23, L 34, L 45, L 5 S1 levels  POST-OPERATIVE DIAGNOSIS:  Spinal stenosis of lumbar region with neurogenic claudication, spondylolisthesis, scoliosis, radiculopathy L 23, L 34, L 45, L 5 S1 levels  PROCEDURE:  Procedure(s) with comments: Lumbar Five-Sacral One Anterior lumbar interbody fusion with Dr. Sherren Mocha Early to assist (N/A) - L5-S1 Anterior lumbar interbody fusion with Dr. Sherren Mocha Early to assist LUMBAR TWO-THREE, LUMBAR THREE-FOUR, LUMBAR FOUR-FIVE  ANTEROLATERAL LUMBAR INTERBODY FUSION (N/A) - L2-3 L3-4 L4-5 Anterolateral lumbar interbody fusion LUMBAR TWO-SACRAL ONE Percuataneous Pedicle Screws (N/A) ABDOMINAL EXPOSURE (N/A)  SURGEON:  Surgeon(s) and Role: Panel 1:    * Erline Levine, MD - Primary    * Kary Kos, MD - Assisting  Panel 2:    * Rosetta Posner, MD - Primary  PHYSICIAN ASSISTANT:   ASSISTANTS: Poteat, RN   ANESTHESIA:   general  EBL:  Total I/O In: 3200 [I.V.:3200] Out: 920 [Urine:760; Blood:160]  BLOOD ADMINISTERED:none  DRAINS: none   LOCAL MEDICATIONS USED:  MARCAINE    and LIDOCAINE   SPECIMEN:  No Specimen  DISPOSITION OF SPECIMEN:  N/A  COUNTS:  YES  TOURNIQUET:  * No tourniquets in log *  DICTATION: DICTATION:   INDICATIONS:  Pateint is 76 year old female with chronic and intractable back and bilateral lower extremity pain, left greater than right.  She has dextroconvex lumbar scoliosis and L 5 S 1 anterolisthesis with lateral foraminal stenosis L 23, L 3, L 45 levels, left greater than right.   It was elected to take her to surgery for anterior lumbar decompression and fusion at the L 23, L 34, L45, L 5 S1 levels.   PROCEDURE:  Doctor Early performed exposure and his portion of the procedure will be dictated separately.  Upon exposing the L 5 S1 level,  a localizing X ray was obtained with the C arm.  I then incised the anterior annulus and performed a thorough discectomy with wide ligamentous releases.  The endplates were cleared of disc and cartilagenous material and a thorough discectomy was performed with decompression of the ventral annulus and disc material.  After trials, a 15 degree, 8 x 38 x 28 Base titanium ALIF cage was placed and lagged with 2, 5.0 x 20 mm screws in S 1 and one in L 5 ( 5.0 x 17.5 mm). This spacer was packed with extra extra small BMP and Attrax.  The implant was tamped into position and positioning was confirmed with C arm.   Locking mechanisms were engaged, soft tissues were inspected and found to be in good repair.   Fascia was closed with 1 PDS running stitch, skin edges closed with 2-0 and 3-0 vicryl sutures.  Wound was dressed with a sterile occlusive dressing.    Patient was then  placed in a right lateral decubitus position on the operative table and using orthogonally projected C-arm fluoroscopy the patient was placed so that the L 45 levels were visualized in AP and lateral plane. The patient was then taped into position.  Skin was marked along with a posterior finger dissection incision. Her flank was then prepped and draped in usual sterile fashion and incisions were made sequentially at L 45 level along the iliac crest and then at L3  working between the 11 th and 12 th ribs. Finger  dissection was made to enter the retroperitoneal space and then subsequently the probe was inserted into the psoas muscle from the left side initially at the L45 level. After mapping the neural elements were able to dock the probe per the midpoint of this vertebral level and without indications electrically of too close proximity to the neural tissues. Subsequently the self-retaining tractor was.after sequential dilators were utilized the shim was employed and the interspace was cleared of psoas muscle and then incised. A thorough discectomy  was performed. Instruments were used to clear the interspace of disc material. After thorough discectomy was performed and this was performed using AP and lateral fluoroscopy a 10 lordotic by 55 x 22 mm Modulus titanium implant was packed with small BMP and Attrax. This was tamped into position using the slides and its position was confirmed on AP and lateral fluoroscopy.  Hemostasis was assured. Finger dissection was made to enter the retroperitoneal space and then subsequently the probe was inserted into the psoas muscle from the left side initially at the L34 level. After mapping the neural elements were able to dock the probe per the midpoint of this vertebral level and without indications electrically of too close proximity to the neural tissues. Subsequently the self-retaining tractor was.after sequential dilators were utilized the shim was employed and the interspace was cleared of psoas muscle and then incised. A thorough discectomy was performed. Instruments were used to clear the interspace of disc material. After thorough discectomy was performed and this was performed using AP and lateral fluoroscopy a 10 lordotic by 55 x 22 mm implant was packed with remaining BMP and Attrax. This was tamped into position using the slides and its position was confirmed on AP and lateral fluoroscopy.  Hemostasis was assured Finger dissection was made to enter the retroperitoneal space and then subsequently the probe was inserted into the psoas muscle from the left side  at the L23 level. After mapping the neural elements were able to dock the probe per the midpoint of this vertebral level and without indications electrically of too close proximity to the neural tissues. Subsequently the self-retaining tractor was.after sequential dilators were utilized the shim was employed and the interspace was cleared of psoas muscle and then incised. A thorough discectomy was performed. Instruments were used to clear the interspace  of disc material. After thorough discectomy was performed and this was performed using AP and lateral fluoroscopy a 10 lordotic by 50 x 22 mm implant was packed with remaining BMP and Attrax. This was tamped into position using the slides and its position was confirmed on AP and lateral fluoroscopy.  Hemostasis was assured at each level.  Incisions were closed with 0, 2-0, 3-0 vicryl sutures and dressed with Dermabond and an occlusive dressing.  The patient was then turned into a prone position on the [place] table on chest rolls and using AP and lateral fluoroscopy throughout this portion of the procedure, pedicle screws were placed using Nuvasive cannulated percutaneous screws. After placing guide wires at each level with the use of nerve monitoring throughout, Nuvasive Reline towers were docked on the L2, L 3, L 4, L 5 S 1 levels.  Pedicle screws were placed bilaterally at L 2 (6.5 x 40 right and 5.5 x 40 left), 2 at L 3 (6.5 x 40), 2 at L 4 and  L 5 (6.5 x 45) and S1 (6.5 x 40 bilaterally). 120 mm rods were then affixed to the screw heads through a separate stab incision  and locked down on the screws. All connections were then torqued and the Towers were disassembled. The wounds were irrigated and then closed with 1, 2-0 and 3-0 Vicryl stitches.   Sterile occlusive dressing was placed with Dermabond. The patient was then extubated in the operating room and taken to recovery in stable and satisfactory condition having tolerated his operation well. Counts were correct at the end of the case.  Sterile occlusive dressing was placed with Dermabond. The patient was then extubated in the operating room and taken to recovery in stable and satisfactory condition having tolerated his operation well. Counts were correct at the end of the case.  Patient was extubated in the OR and taken to recovery having tolerated her surgery well.  Counts were correct.    Retractor Times: L 23 (16:4mins); L 34 (16:14 mins); L 45  (22:05 mins).  Pelvic Parameters:  Preop:  PT 1.4 degrees; PI 58.9 degrees; LL -67; PI- LL -8.1 degree; SVA 6.8 mm;     PLAN OF CARE: Admit to inpatient   PATIENT DISPOSITION:  PACU - hemodynamically stable.   Delay start of Pharmacological VTE agent (>24hrs) due to surgical blood loss or risk of bleeding: yes

## 2016-08-12 NOTE — Transfer of Care (Signed)
Immediate Anesthesia Transfer of Care Note  Patient: [name]  Procedure(s) Performed: Procedure(s) with comments: Lumbar Five-Sacral One Anterior lumbar interbody fusion with Dr. Sherren Mocha Early to assist (N/A) - L5-S1 Anterior lumbar interbody fusion with Dr. Sherren Mocha Early to assist LUMBAR TWO-THREE, LUMBAR THREE-FOUR, LUMBAR FOUR-FIVE  ANTEROLATERAL LUMBAR INTERBODY FUSION (N/A) - L2-3 L3-4 L4-5 Anterolateral lumbar interbody fusion LUMBAR TWO-SACRAL ONE Percuataneous Pedicle Screws (N/A) ABDOMINAL EXPOSURE (N/A)  Patient Location: PACU  Anesthesia Type:General  Level of Consciousness: awake  Airway & Oxygen Therapy: Patient Spontanous Breathing and Patient connected to face mask oxygen  Post-op Assessment: Report given to RN, Post -op Vital signs reviewed and stable and Patient moving all extremities X 4  Post vital signs: Reviewed and stable  Last Vitals:  Vitals:   08/12/16 0603 08/12/16 1543  BP: (!) 156/56   Pulse: 72   Resp: 20   Temp: 36.8 C 36.4 C    Last Pain:  Vitals:   08/12/16 0607  TempSrc:   PainSc: 4       Patients Stated Pain Goal: 5 (81/01/75 1025)  Complications: No apparent anesthesia complications

## 2016-08-13 LAB — POCT I-STAT 4, (NA,K, GLUC, HGB,HCT)
GLUCOSE: 158 mg/dL — AB (ref 65–99)
GLUCOSE: 209 mg/dL — AB (ref 65–99)
HCT: 27 % — ABNORMAL LOW (ref 36.0–46.0)
HEMATOCRIT: 28 % — AB (ref 36.0–46.0)
Hemoglobin: 9.2 g/dL — ABNORMAL LOW (ref 12.0–15.0)
Hemoglobin: 9.5 g/dL — ABNORMAL LOW (ref 12.0–15.0)
POTASSIUM: 4.2 mmol/L (ref 3.5–5.1)
Potassium: 4.1 mmol/L (ref 3.5–5.1)
SODIUM: 138 mmol/L (ref 135–145)
Sodium: 139 mmol/L (ref 135–145)

## 2016-08-13 NOTE — Evaluation (Signed)
Physical Therapy Evaluation Patient Details Name: [name] MRN: [id number] DOB: Jan 31, 1941 Today's Date: 08/13/2016   History of Present Illness  76 yo admitted for L2-5 XLIF  Clinical Impression  Pt very pleasant and typically cares for herself and lives alone but has had increasing leg instability with gait with progression from cane to rollator lately. Pt with decreased strength, balance, activity tolerance, awareness of precautions and impaired gait who will benefit from acute therapy to maximize mobility, gait, independence and adherence to precautions. Pt educated for brace wear and able to don with only min cues.     Follow Up Recommendations SNF;Supervision/Assistance - 24 hour    Equipment Recommendations  None recommended by PT    Recommendations for Other Services OT consult     Precautions / Restrictions Precautions Precautions: Back;Fall Precaution Booklet Issued: Yes (comment) Required Braces or Orthoses: Spinal Brace Spinal Brace: Lumbar corset;Applied in sitting position      Mobility  Bed Mobility Overal bed mobility: Needs Assistance Bed Mobility: Rolling;Sidelying to Sit Rolling: Min assist Sidelying to sit: Mod assist       General bed mobility comments: cues for sequence with assist to rotate trunk, elevate trunk and move legs off of bed  Transfers Overall transfer level: Needs assistance   Transfers: Sit to/from Stand Sit to Stand: Min assist         General transfer comment: assist to rise with cues for hand placement and posture  Ambulation/Gait Ambulation/Gait assistance: Min assist Ambulation Distance (Feet): 40 Feet Assistive device: Rolling walker (2 wheeled) Gait Pattern/deviations: Shuffle;Trunk flexed   Gait velocity interpretation: Below normal speed for age/gender General Gait Details: cues for posture, position in RW, gaze stabilization with assist to direct and turn RW. limited by fatigue  Stairs             Wheelchair Mobility    Modified Rankin (Stroke Patients Only)       Balance Overall balance assessment: Needs assistance   Sitting balance-Leahy Scale: Fair       Standing balance-Leahy Scale: Poor                               Pertinent Vitals/Pain Pain Assessment: No/denies pain    Home Living Family/patient expects to be discharged to:: Private residence Living Arrangements: Alone Available Help at Discharge: Family (daughter plans to stay 1wk after ST-SNF stay) Type of Home: House Home Access: Stairs to enter   [employer] of Steps: 1 Home Layout: One level Home Equipment: Environmental consultant - 2 wheels;Walker - 4 wheels;Cane - single point;Shower seat;Hand held shower head      Prior Function Level of Independence: Independent with assistive device(s)         Comments: uses a cane or rollator      Hand Dominance        Extremity/Trunk Assessment   Upper Extremity Assessment Upper Extremity Assessment: Generalized weakness (pt reports bil shoulder atrophy)    Lower Extremity Assessment Lower Extremity Assessment: Generalized weakness    Cervical / Trunk Assessment Cervical / Trunk Assessment: Kyphotic;Other exceptions Cervical / Trunk Exceptions: post surgical  Communication   Communication: No difficulties  Cognition Arousal/Alertness: Awake/alert Behavior During Therapy: WFL for tasks assessed/performed Overall Cognitive Status: Within Functional Limits for tasks assessed                 General Comments: pt with slightly slow responses suspect due to medication  General Comments      Exercises     Assessment/Plan    PT Assessment Patient needs continued PT services  PT Problem List Decreased strength;Decreased mobility;Decreased safety awareness;Decreased activity tolerance;Decreased balance;Decreased knowledge of use of DME;Decreased knowledge of precautions       PT Treatment Interventions Functional  mobility training;Gait training;Therapeutic exercise;Patient/family education;DME instruction;Therapeutic activities    PT Goals (Current goals can be found in the Care Plan section)  Acute Rehab PT Goals Patient Stated Goal: return to walking my dog PT Goal Formulation: With patient Time For Goal Achievement: 08/27/16 Potential to Achieve Goals: Good    Frequency Min 5X/week   Barriers to discharge Decreased caregiver support      Co-evaluation               End of Session Equipment Utilized During Treatment: Gait belt;Back brace Activity Tolerance: Patient tolerated treatment well Patient left: in chair;with call bell/phone within reach;with chair alarm set;with family/visitor present (chair alarm pad under pt however no box in room or available with RN and Diplomatic Services operational officer aware) Nurse Communication: Mobility status;Precautions PT Visit Diagnosis: Unsteadiness on feet (R26.81);Difficulty in walking, not elsewhere classified (R26.2);Muscle weakness (generalized) (M62.81)         Time: 2952-8413 PT Time Calculation (min) (ACUTE ONLY): 37 min   Charges:   PT Evaluation $PT Eval Moderate Complexity: 1 Procedure PT Treatments $Therapeutic Activity: 8-22 mins   PT G Codes:         Adriel Kessen B Keilin Gamboa August 19, 2016, 10:23 AM  Delaney Meigs, PT [phone]

## 2016-08-13 NOTE — Progress Notes (Signed)
Voided small amt, in and out cathed for 335ml clear yellow urine. Cathed at 3pm. instr. Pt to increase p.o. Fluids, esp. H2O.

## 2016-08-13 NOTE — Progress Notes (Signed)
Patient oob with brace on to [facility] with 1 person max/moderate assist.  Patient having pain and spasms and began sweating.  Vs obtained and documented. Patient unable to void.  Placed patient back in bed.  Bladder scan reads 212 ml in bladder.  Pain meds administered per MD order and VS rechecked and documented.

## 2016-08-13 NOTE — Progress Notes (Signed)
Subjective: Patient reports Patient doing well pain well controlled  Objective: Vital signs in last 24 hours: Temp:  [97.6 F (36.4 C)-99.7 F (37.6 C)] 99.7 F (37.6 C) (03/17 8828) Pulse Rate:  [58-97] 85 (03/17 0633) Resp:  [12-18] 18 (03/17 0633) BP: (114-194)/(52-104) 114/76 (03/17 0633) SpO2:  [93 %-100 %] 97 % (03/17 0034) Arterial Line BP: (166-201)/(66-80) 186/73 (03/16 1651)  Intake/Output from previous day: 03/16 0701 - 03/17 0700 In: 4340 [P.O.:670; I.V.:3670] Out: 2220 [Urine:2060; Blood:160] Intake/Output this shift: No intake/output data recorded.  Strength 5 out of 5 incisions clean dry and intact  Lab Results: No results for input(s): WBC, HGB, HCT, PLT in the last 72 hours. BMET No results for input(s): NA, K, CL, CO2, GLUCOSE, BUN, CREATININE, CALCIUM in the last 72 hours.  Studies/Results: Dg Lumbar Spine Complete  Result Date: 08/12/2016 CLINICAL DATA:  Lumbar fusion. EXAM: LUMBAR SPINE - COMPLETE 4+ VIEW; DG C-ARM GT 120 MIN COMPARISON:  Lumbar spine MR dated 05/21/2016. FINDINGS: The last open disc space on the previous MR was labeled the L5-S1 level. 5 C-arm views of the lumbar spine are submitted for interpretation. These demonstrate interval placement of interbody fixation hardware at the L5-S1 level with normal alignment. Metallic interbody spacers are demonstrated at the L2-3, L3-4 and L4-5 levels. Pedicle screw and rod fixation is also demonstrated at the L2 through S1 levels. IMPRESSION: Operative changes, as described above. Electronically Signed   By: Claudie Revering M.D.   On: 08/12/2016 15:30   Dg C-arm Gt 120 Min  Result Date: 08/12/2016 CLINICAL DATA:  Lumbar fusion. EXAM: LUMBAR SPINE - COMPLETE 4+ VIEW; DG C-ARM GT 120 MIN COMPARISON:  Lumbar spine MR dated 05/21/2016. FINDINGS: The last open disc space on the previous MR was labeled the L5-S1 level. 5 C-arm views of the lumbar spine are submitted for interpretation. These demonstrate interval  placement of interbody fixation hardware at the L5-S1 level with normal alignment. Metallic interbody spacers are demonstrated at the L2-3, L3-4 and L4-5 levels. Pedicle screw and rod fixation is also demonstrated at the L2 through S1 levels. IMPRESSION: Operative changes, as described above. Electronically Signed   By: Claudie Revering M.D.   On: 08/12/2016 15:30   Dg Or Local Abdomen  Result Date: 08/12/2016 CLINICAL DATA:  76 year old female undergoing anterior lumbar interbody fixation EXAM: OR LOCAL ABDOMEN COMPARISON:  MRI lumbar spine 05/21/2016 FINDINGS: Two frontal radiographs of the lumbar spine are submitted. Interval surgical changes of L5-S1 anterior lumbar interbody fusion. No evidence of obvious hardware complication. Incompletely imaged prior surgical changes of left hip arthroplasty. There is mild rotary and dextroconvex scoliosis centered at L2-L3. Severe right-sided hip degenerative osteoarthritis. Unremarkable bowel gas pattern. Two surgical clips project over the anatomic pelvis. No unexpected radio opacities or retained objects. IMPRESSION: 1. L5-S1 anterior lumbar interbody fusion as above. 2. Advanced right hip joint degenerative osteoarthritis. 3. Rotary and dextroconvex scoliosis centered at L2-L3. Electronically Signed   By: Jacqulynn Cadet M.D.   On: 08/12/2016 10:01    Assessment/Plan: 76 year old female postop day 1 from anterior, anterolateral, and posterior fusion fairly well no new numbness or tingling significant improvement lower extremity pain continue with physical occupational therapy.  LOS: 1 day     [name] P 08/13/2016, 9:57 AM

## 2016-08-13 NOTE — Progress Notes (Signed)
Patient desaturated on room air after receiving IV pain medication to 80%, placed on 4L nasal cannula, sats increased to 97%.  Placed on continuous pulse ox, will continue to monitor.  Patient is drowsy and easy to arouse at this time.

## 2016-08-14 MED ORDER — PANTOPRAZOLE SODIUM 40 MG PO TBEC
40.0000 mg | DELAYED_RELEASE_TABLET | Freq: Every day | ORAL | Status: DC
  Administered 2016-08-14 – 2016-08-15 (×2): 40 mg via ORAL
  Filled 2016-08-14 (×2): qty 1

## 2016-08-14 NOTE — Progress Notes (Signed)
PHARMACIST - PHYSICIAN COMMUNICATION DR:   Vertell Limber CONCERNING: Protonix IV to Oral Route Change Policy  RECOMMENDATION: This patient is receiving Protonix by the intravenous route.  Based on criteria approved by the Pharmacy and Therapeutics Committee, this drug is being converted to the equivalent oral dose form(s).  DESCRIPTION: These criteria include:  The patient is eating (either orally or via tube) and/or has been taking other orally administered medications for a least 24 hours  There is no active GI bleed or impaired GI absorption noted.   If you have questions about this conversion, please contact the Pharmacy Department  []   [phone] )  [facility] [x]   [phone] )  [facility]  []   [phone] )  [facility] []   [phone] )  [place], PharmD, BCPS Clinical Pharmacist 08/14/2016 10:59 AM

## 2016-08-14 NOTE — Evaluation (Signed)
Occupational Therapy Evaluation Patient Details Name: [name] MRN: [id number] DOB: [birth year] Today's Date: 08/14/2016    History of Present Illness Pt is a 76 y.o. female s/p L5-S1 anterior lumbar interbody fusion, L23,L3-4,L4-5 anterolateral lumbar interbody fusion, L2-S1 percutaneous pedicle screws, and abdominal exposure. She has a PMH significant for HTN, diabetes mellitus, hyperlipidemia, DVT, and PE, granulomatous lung disease, asthma, anemia, and GERD.   Clinical Impression   PTA, pt was utilizing a rollator or cane for functional mobility and was able to complete ADL with modified independence. She currently requires min assist for toilet transfers and max assist for LB ADL while adhering to back precautions. Educated pt on back precautions during ADL, brace wear schedule, and need for clothing between brace and skin. Pt demonstrates significant functional decline from PLOF and would benefit from short-term SNF placement for continued rehabilitation in order to maximize independence with ADL. She would benefit from continued OT services while admitted to improve independence with ADL and functional mobility.    Follow Up Recommendations  SNF    Equipment Recommendations  Other (comment) (TBD at next venue of care.)    Recommendations for Other Services       Precautions / Restrictions Precautions Precautions: Back;Fall Precaution Booklet Issued: Yes (comment) Precaution Comments: Reviewed precautions and handout previously provided by PT. Required Braces or Orthoses: Spinal Brace Spinal Brace: Lumbar corset;Applied in sitting position Restrictions Weight Bearing Restrictions: No      Mobility Bed Mobility Overal bed mobility: Needs Assistance Bed Mobility: Rolling;Sidelying to Sit Rolling: Min assist Sidelying to sit: Min assist     Sit to sidelying: Mod assist General bed mobility comments: Increased time and min assist to raise trunk from  bed.  Transfers Overall transfer level: Needs assistance Equipment used: Rolling walker (2 wheeled) Transfers: Sit to/from Stand Sit to Stand: Min assist         General transfer comment: Assist to rise to standing. Trembling B LE's once full standing position achieved.    Balance Overall balance assessment: Needs assistance Sitting-balance support: Bilateral upper extremity supported;Single extremity supported;Feet supported Sitting balance-Leahy Scale: Fair     Standing balance support: Bilateral upper extremity supported;During functional activity Standing balance-Leahy Scale: Poor                              ADL Overall ADL's : Needs assistance/impaired Eating/Feeding: Set up;Sitting   Grooming: Set up;Sitting   Upper Body Bathing: Minimal assistance;Sitting   Lower Body Bathing: Maximal assistance;Sit to/from stand   Upper Body Dressing : Minimal assistance;Sitting   Lower Body Dressing: Maximal assistance;Sit to/from stand   Toilet Transfer: Minimal assistance;Ambulation;RW;BSC Toilet Transfer Details (indicate cue type and reason): Simulated from bed to chair with short distance ambulation. Toileting- Architect and Hygiene: Sit to/from stand;Moderate assistance       Functional mobility during ADLs: Minimal assistance;Rolling walker General ADL Comments: Pt educated concerning back precautions during ADL and functional mobility. She was limited by pain this session but demonstrates good understanding of precautions.     Vision Baseline Vision/History: Wears glasses Wears Glasses: At all times Patient Visual Report: No change from baseline Vision Assessment?: No apparent visual deficits     Perception     Praxis      Pertinent Vitals/Pain Pain Assessment: Faces Pain Score: 6  Faces Pain Scale: Hurts even more Pain Location: back at incision, L side of abdomen Pain Descriptors / Indicators:  Aching;Burning;Discomfort;Grimacing;Guarding Pain  Intervention(s): Limited activity within patient's tolerance;Monitored during session;Patient requesting pain meds-RN notified     Hand Dominance     Extremity/Trunk Assessment Upper Extremity Assessment Upper Extremity Assessment: Generalized weakness   Lower Extremity Assessment Lower Extremity Assessment: Generalized weakness   Cervical / Trunk Assessment Cervical / Trunk Assessment: Kyphotic;Other exceptions Cervical / Trunk Exceptions: post surgical   Communication Communication Communication: No difficulties   Cognition Arousal/Alertness: Awake/alert Behavior During Therapy: WFL for tasks assessed/performed Overall Cognitive Status: Within Functional Limits for tasks assessed                 General Comments: pt with slightly slow responses suspect due to medication   General Comments       Exercises       Shoulder Instructions      Home Living Family/patient expects to be discharged to:: Skilled nursing facility Living Arrangements: Alone Available Help at Discharge: Family (daughter planning to stay 1 week after SNF) Type of Home: House Home Access: Stairs to enter [employer] of Steps: 1   Home Layout: One level     Bathroom Shower/Tub: Chief Strategy Officer: Handicapped height     Home Equipment: Environmental consultant - 2 wheels;Walker - 4 wheels;Cane - single point;Shower seat;Hand held shower head          Prior Functioning/Environment Level of Independence: Independent with assistive device(s)        Comments: uses a cane or rollator         OT Problem List: Decreased strength;Decreased range of motion;Decreased activity tolerance;Impaired balance (sitting and/or standing);Decreased safety awareness;Decreased knowledge of use of DME or AE;Decreased knowledge of precautions;Pain      OT Treatment/Interventions: Self-care/ADL training;Therapeutic exercise;Energy  conservation;DME and/or AE instruction;Therapeutic activities;Patient/family education;Balance training    OT Goals(Current goals can be found in the care plan section) Acute Rehab OT Goals Patient Stated Goal: return to walking my dog OT Goal Formulation: With patient Time For Goal Achievement: 08/28/16 Potential to Achieve Goals: Good ADL Goals Pt Will Perform Lower Body Bathing: with modified independence;sit to/from stand;with adaptive equipment Pt Will Perform Lower Body Dressing: with modified independence;sit to/from stand;with adaptive equipment Pt Will Transfer to Toilet: with supervision;ambulating;bedside commode (BSC over toilet) Pt Will Perform Toileting - Clothing Manipulation and hygiene: with modified independence;sit to/from stand Additional ADL Goal #1: Pt will recall 3/3 back precautions and adhere to these during morning ADL routine.  OT Frequency: Min 2X/week   Barriers to D/C:            Co-evaluation              End of Session Equipment Utilized During Treatment: Rolling walker;Back brace Nurse Communication: Mobility status  Activity Tolerance: Patient tolerated treatment well Patient left: in chair;with call bell/phone within reach;with family/visitor present  OT Visit Diagnosis: Muscle weakness (generalized) (M62.81)                ADL either performed or assessed with clinical judgement  Time: 1241-1309 OT Time Calculation (min): 28 min Charges:  OT General Charges $OT Visit: 1 Procedure OT Evaluation $OT Eval Moderate Complexity: 1 Procedure OT Treatments $Self Care/Home Management : 8-22 mins G-Codes:     Doristine Section, MS OTR/L  Pager: [phone]   Jairo Bellew A Brylee Berk 08/14/2016, 4:09 PM

## 2016-08-14 NOTE — Plan of Care (Signed)
Problem: Fluid Volume: Goal: Ability to maintain a balanced intake and output will improve Outcome: Not Progressing Pt having difficulty voiding since OR. She gets up to [facility] but voids scant amounts requiring in and out cathing.

## 2016-08-14 NOTE — Progress Notes (Signed)
Physical Therapy Treatment Patient Details Name: [name] MRN: [id number] DOB: 07-Feb-1941 Today's Date: 08/14/2016    History of Present Illness 76 yo admitted for L2-5 anterior, anterolateral and posterior lumbar fusion.      PT Comments    Pt performed increased gait but remains guarded with poor quality of movement.  Will continue to recommend SNF placement for rehab before returning to private residence.  Plan next session to focus on mobility and gait training to build confidence with movement.  Pt anxious during treatment.     Follow Up Recommendations  SNF;Supervision/Assistance - 24 hour     Equipment Recommendations  None recommended by PT    Recommendations for Other Services OT consult     Precautions / Restrictions Precautions Precautions: Back;Fall Precaution Booklet Issued: Yes (comment) Required Braces or Orthoses: Spinal Brace Spinal Brace: Lumbar corset;Applied in sitting position    Mobility  Bed Mobility Overal bed mobility: Needs Assistance Bed Mobility: Rolling;Sidelying to Sit;Sit to Sidelying Rolling: Min assist (required cues to achieve hooklying in efforts to roll.  ) Sidelying to sit: Min assist     Sit to sidelying: Mod assist General bed mobility comments: cues for sequence with assist to rotate trunk, elevate trunk and move legs off of bed.  Pt also required cues for back to bed with assist to lift B LEs against gravity.    Transfers Overall transfer level: Needs assistance Equipment used: Rolling walker (2 wheeled) Transfers: Sit to/from Stand Sit to Stand: Min assist         General transfer comment: assist to rise with cues for hand placement and posture  Ambulation/Gait Ambulation/Gait assistance: Min assist Ambulation Distance (Feet): 80 Feet Assistive device: Rolling walker (2 wheeled) Gait Pattern/deviations: Shuffle;Trunk flexed   Gait velocity interpretation: Below normal speed for age/gender General Gait Details:  cues for posture, position in RW, gaze stabilization with assist to direct and turn RW. limited by fatigue.  Pt with shuffling pattern and poor foot clearance.     Stairs            Wheelchair Mobility    Modified Rankin (Stroke Patients Only)       Balance Overall balance assessment: Needs assistance   Sitting balance-Leahy Scale: Fair       Standing balance-Leahy Scale: Poor                      Cognition Arousal/Alertness: Awake/alert Behavior During Therapy: WFL for tasks assessed/performed Overall Cognitive Status: Within Functional Limits for tasks assessed                      Exercises      General Comments        Pertinent Vitals/Pain Pain Assessment: 0-10 Pain Score: 6  Pain Descriptors / Indicators: Aching;Burning;Discomfort;Grimacing;Guarding Pain Intervention(s): Monitored during session    Home Living                      Prior Function            PT Goals (current goals can now be found in the care plan section) Acute Rehab PT Goals Patient Stated Goal: return to walking my dog Potential to Achieve Goals: Good Progress towards PT goals: Progressing toward goals    Frequency    Min 5X/week      PT Plan Current plan remains appropriate    Co-evaluation  End of Session Equipment Utilized During Treatment: Gait belt;Back brace Activity Tolerance: Patient tolerated treatment well Patient left: with call bell/phone within reach;with family/visitor present;in bed;with bed alarm set Nurse Communication: Mobility status;Precautions PT Visit Diagnosis: Unsteadiness on feet (R26.81);Difficulty in walking, not elsewhere classified (R26.2);Muscle weakness (generalized) (M62.81)     Time: 1437-1500 PT Time Calculation (min) (ACUTE ONLY): 23 min  Charges:  $Gait Training: 8-22 mins $Therapeutic Activity: 8-22 mins                    G Codes:       Cristela Blue August 28, 2016, 3:30 PM Governor Rooks, PTA pager [phone]

## 2016-08-14 NOTE — Progress Notes (Signed)
Subjective: Patient reports Patient feels better this morning less pain  Objective: Vital signs in last 24 hours: Temp:  [97.5 F (36.4 C)-98.5 F (36.9 C)] 97.5 F (36.4 C) (03/18 0527) Pulse Rate:  [85-104] 97 (03/18 0527) Resp:  [16-18] 16 (03/18 0527) BP: (94-137)/(42-74) 137/74 (03/18 0527) SpO2:  [80 %-100 %] 100 % (03/18 0527)  Intake/Output from previous day: 03/17 0701 - 03/18 0700 In: -  Out: 900 [Urine:900] Intake/Output this shift: No intake/output data recorded.  Awake alert some slight hip flexor weakness on the left but distally appears to be 5 out of 5 incisions clean dry and intact  Lab Results:  Recent Labs  08/12/16 1018 08/12/16 1412  HGB 9.2* 9.5*  HCT 27.0* 28.0*   BMET  Recent Labs  08/12/16 1018 08/12/16 1412  NA 139 138  K 4.1 4.2  GLUCOSE 158* 209*    Studies/Results: Dg Lumbar Spine Complete  Result Date: 08/12/2016 CLINICAL DATA:  Lumbar fusion. EXAM: LUMBAR SPINE - COMPLETE 4+ VIEW; DG C-ARM GT 120 MIN COMPARISON:  Lumbar spine MR dated 05/21/2016. FINDINGS: The last open disc space on the previous MR was labeled the L5-S1 level. 5 C-arm views of the lumbar spine are submitted for interpretation. These demonstrate interval placement of interbody fixation hardware at the L5-S1 level with normal alignment. Metallic interbody spacers are demonstrated at the L2-3, L3-4 and L4-5 levels. Pedicle screw and rod fixation is also demonstrated at the L2 through S1 levels. IMPRESSION: Operative changes, as described above. Electronically Signed   By: Claudie Revering M.D.   On: 08/12/2016 15:30   Dg C-arm Gt 120 Min  Result Date: 08/12/2016 CLINICAL DATA:  Lumbar fusion. EXAM: LUMBAR SPINE - COMPLETE 4+ VIEW; DG C-ARM GT 120 MIN COMPARISON:  Lumbar spine MR dated 05/21/2016. FINDINGS: The last open disc space on the previous MR was labeled the L5-S1 level. 5 C-arm views of the lumbar spine are submitted for interpretation. These demonstrate interval  placement of interbody fixation hardware at the L5-S1 level with normal alignment. Metallic interbody spacers are demonstrated at the L2-3, L3-4 and L4-5 levels. Pedicle screw and rod fixation is also demonstrated at the L2 through S1 levels. IMPRESSION: Operative changes, as described above. Electronically Signed   By: Claudie Revering M.D.   On: 08/12/2016 15:30   Dg Or Local Abdomen  Result Date: 08/12/2016 CLINICAL DATA:  76 year old female undergoing anterior lumbar interbody fixation EXAM: OR LOCAL ABDOMEN COMPARISON:  MRI lumbar spine 05/21/2016 FINDINGS: Two frontal radiographs of the lumbar spine are submitted. Interval surgical changes of L5-S1 anterior lumbar interbody fusion. No evidence of obvious hardware complication. Incompletely imaged prior surgical changes of left hip arthroplasty. There is mild rotary and dextroconvex scoliosis centered at L2-L3. Severe right-sided hip degenerative osteoarthritis. Unremarkable bowel gas pattern. Two surgical clips project over the anatomic pelvis. No unexpected radio opacities or retained objects. IMPRESSION: 1. L5-S1 anterior lumbar interbody fusion as above. 2. Advanced right hip joint degenerative osteoarthritis. 3. Rotary and dextroconvex scoliosis centered at L2-L3. Electronically Signed   By: Jacqulynn Cadet M.D.   On: 08/12/2016 10:01    Assessment/Plan: Postop day 2 from anterior posterior lumbar fusion doing fairly well mobilized yesterday with therapy and continue with progressive mobilization. Patient is voiding.  LOS: 2 days     [name] P 08/14/2016, 8:51 AM

## 2016-08-14 NOTE — Progress Notes (Signed)
Noted bladder distention, in and out cathed patient per protocol and drained 600 ml clear yellow urine.  Patient tolerated well.  Will continue to encourage urination.

## 2016-08-14 NOTE — Progress Notes (Signed)
Voided on BSC, good amount. Sat in chair 20-39min, tol well. Weak upper body strength but she did better today than yesterday.

## 2016-08-15 ENCOUNTER — Encounter (HOSPITAL_COMMUNITY): Payer: Self-pay | Admitting: Neurosurgery

## 2016-08-15 NOTE — NC FL2 (Signed)
[place] LEVEL OF CARE SCREENING TOOL     IDENTIFICATION  Patient Name: [name] Birthdate: 11-Nov-1940 Sex: female Admission Date (Current Location): 08/12/2016  [facility] and [place] Number:  Herbalist and Address:  [facility] [place], [place] [address], [place],  [address]      Provider Number: [id number]  Attending Physician Name and Address:  Erline Levine, MD  Relative Name and Phone Number:       Current Level of Care: Hospital Recommended Level of Care: [place] Prior Approval Number:    Date Approved/Denied:   PASRR Number:   [id number]  Discharge Plan: SNF    Current Diagnoses: Patient Active Problem List   Diagnosis Date Noted  . Lumbar scoliosis 08/12/2016  . Controlled diabetes mellitus type 2 with complications ([place]) 42/39/5320  . Scoliosis 11/24/2015  . Low back pain 09/29/2014  . Diabetes mellitus with peripheral vascular disease ([place]) 04/10/2013  . Left carotid bruit 04/10/2013  . Anemia 03/31/2013  . Degenerative arthritis of hip 06/01/2012  . POSTMENOPAUSAL SYNDROME 09/16/2009  . ARTHRALGIA 09/16/2009  . Seasonal and perennial allergic rhinitis 10/08/2007  . OSTEOPENIA 08/06/2007  . ELEVATED BLOOD PRESSURE WITHOUT DIAGNOSIS OF HYPERTENSION 08/06/2007  . HYPERLIPIDEMIA 05/14/2007  . Asthma, mild intermittent 05/14/2007  . History of pulmonary embolus (PE) 08/31/2006    Orientation RESPIRATION BLADDER Height & Weight     Self, Time, Situation, Place  Normal Continent Weight: 151 lb 14.4 oz (68.9 kg) Height:  5\' 3"  (160 cm)  BEHAVIORAL SYMPTOMS/MOOD NEUROLOGICAL BOWEL NUTRITION STATUS      Continent Diet (See DC summary)  AMBULATORY STATUS COMMUNICATION OF NEEDS Skin   Extensive Assist Verbally Surgical wounds                       Personal Care Assistance Level of Assistance  Bathing, Feeding, Dressing Bathing Assistance: Limited assistance Feeding assistance: Limited  assistance Dressing Assistance: Limited assistance     Functional Limitations Info  Sight, Hearing, Speech Sight Info: Adequate Hearing Info: Adequate Speech Info: Adequate    SPECIAL CARE FACTORS FREQUENCY  PT (By licensed PT), OT (By licensed OT)     PT Frequency: 5x a week OT Frequency: 5x a week            Contractures Contractures Info: Not present    Additional Factors Info  Code Status, Allergies Code Status Info: Full Code Allergies Info: Statins           Current Medications (08/15/2016):  This is the current hospital active medication list Current Facility-Administered Medications  Medication Dose Route Frequency Provider Last Rate Last Dose  . 0.9 %  sodium chloride infusion  250 mL Intravenous Continuous Erline Levine, MD      . acetaminophen (TYLENOL) tablet 650 mg  650 mg Oral Q4H PRN Erline Levine, MD       Or  . acetaminophen (TYLENOL) suppository 650 mg  650 mg Rectal Q4H PRN Erline Levine, MD      . albuterol (PROVENTIL) (2.5 MG/3ML) 0.083% nebulizer solution 3 mL  3 mL Inhalation Q6H PRN Erline Levine, MD   3 mL at 08/13/16 1013  . alum & mag hydroxide-simeth (MAALOX/MYLANTA) 200-200-20 MG/5ML suspension 30 mL  30 mL Oral Q6H PRN Erline Levine, MD      . aspirin chewable tablet 81 mg  81 mg Oral Daily Erline Levine, MD   81 mg at 08/15/16 0917  . bisacodyl (  DULCOLAX) suppository 10 mg  10 mg Rectal Daily PRN Erline Levine, MD   10 mg at 08/15/16 0917  . cholecalciferol (VITAMIN D) tablet 1,000 Units  1,000 Units Oral Daily Erline Levine, MD   1,000 Units at 08/15/16 0917  . cyclobenzaprine (FLEXERIL) tablet 5 mg  5 mg Oral TID PRN Erline Levine, MD   5 mg at 08/15/16 1145  . dextrose 5 % and 0.45 % NaCl with KCl 20 mEq/L infusion   Intravenous Continuous Erline Levine, MD 75 mL/hr at 08/13/16 0020    . diazepam (VALIUM) tablet 5 mg  5 mg Oral Q6H PRN Erline Levine, MD      . docusate sodium (COLACE) capsule 100 mg  100 mg Oral BID Erline Levine, MD   100 mg at  08/15/16 0917  . gabapentin (NEURONTIN) capsule 300 mg  300 mg Oral TID Erline Levine, MD   300 mg at 08/15/16 0917  . HYDROcodone-acetaminophen (NORCO/VICODIN) 5-325 MG per tablet 1 tablet  1 tablet Oral Q6H PRN Erline Levine, MD   1 tablet at 08/14/16 1317  . HYDROmorphone (DILAUDID) injection 0.5-1 mg  0.5-1 mg Intravenous Q2H PRN Erline Levine, MD   1 mg at 08/14/16 2327  . ketorolac (ACULAR) 0.5 % ophthalmic solution 1 drop  1 drop Both Eyes QID Annye Asa, MD   1 drop at 08/15/16 0919  . menthol-cetylpyridinium (CEPACOL) lozenge 3 mg  1 lozenge Oral PRN Erline Levine, MD       Or  . phenol (CHLORASEPTIC) mouth spray 1 spray  1 spray Mouth/Throat PRN Erline Levine, MD      . metFORMIN (GLUCOPHAGE-XR) 24 hr tablet 500 mg  500 mg Oral Q breakfast Erline Levine, MD   500 mg at 08/15/16 0918  . mirabegron ER (MYRBETRIQ) tablet 50 mg  50 mg Oral Daily Erline Levine, MD   50 mg at 08/15/16 0917  . mometasone-formoterol (DULERA) 200-5 MCG/ACT inhaler 2 puff  2 puff Inhalation BID Erline Levine, MD   2 puff at 08/15/16 0909  . multivitamin (PROSIGHT) tablet 1 tablet  1 tablet Oral Daily Erline Levine, MD   1 tablet at 08/15/16 [phone]  . multivitamin with minerals tablet 1 tablet  1 tablet Oral Daily Erline Levine, MD   1 tablet at 08/15/16 [phone]  . ondansetron (ZOFRAN) tablet 4 mg  4 mg Oral Q6H PRN Erline Levine, MD       Or  . ondansetron [facility]) injection 4 mg  4 mg Intravenous Q6H PRN Erline Levine, MD   4 mg at 08/14/16 2009  . oxyCODONE (Oxy IR/ROXICODONE) immediate release tablet 5-10 mg  5-10 mg Oral Q3H PRN Erline Levine, MD   10 mg at 08/15/16 0458  . pantoprazole (PROTONIX) EC tablet 40 mg  40 mg Oral QHS Rolla Flatten, RPH   40 mg at 08/14/16 2141  . polyethylene glycol (MIRALAX / GLYCOLAX) packet 17 g  17 g Oral Daily Erline Levine, MD   17 g at 08/15/16 9702  . polyethylene glycol (MIRALAX / GLYCOLAX) packet 17 g  17 g Oral Daily PRN Erline Levine, MD      . sodium chloride flush (NS) 0.9 %  injection 3 mL  3 mL Intravenous Q12H Erline Levine, MD   3 mL at 08/15/16 0918  . sodium chloride flush (NS) 0.9 % injection 3 mL  3 mL Intravenous PRN Erline Levine, MD      . sodium phosphate (FLEET) 7-19 GM/118ML enema 1 enema  1  enema Rectal Once PRN Erline Levine, MD      . zolpidem [facility]) tablet 5 mg  5 mg Oral QHS PRN Erline Levine, MD         Discharge Medications: Please see discharge summary for a list of discharge medications.  Relevant Imaging Results:  Relevant Lab Results:   Additional Information SSN:  [id number]  Lilly Cove, [place]

## 2016-08-15 NOTE — Progress Notes (Addendum)
Subjective: Patient reports "The last time I got up, I felt faint"   Objective: Vital signs in last 24 hours: Temp:  [98.1 F (36.7 C)-98.4 F (36.9 C)] 98.4 F (36.9 C) (03/19 0459) Pulse Rate:  [77-100] 77 (03/19 0459) Resp:  [16-18] 16 (03/19 0148) BP: (135-140)/(66-72) 135/72 (03/19 0459) SpO2:  [92 %-100 %] 93 % (03/19 0459) Weight:  [68.9 kg (151 lb 14.4 oz)] 68.9 kg (151 lb 14.4 oz) (03/18 2346)  Intake/Output from previous day: 03/18 0701 - 03/19 0700 In: -  Out: 600 [Urine:600] Intake/Output this shift: No intake/output data recorded.  Alert, conversant. Daughter and granddaughter present. Pt reports left thigh pain with position changes, as expected. Incisions without erythema, swelling, or drainage beneath honeycomb & Dermabond. Abdomen slightly distended, but passing gas. Good strength BLE.   Lab Results:  Recent Labs  08/12/16 1018 08/12/16 1412  HGB 9.2* 9.5*  HCT 27.0* 28.0*   BMET  Recent Labs  08/12/16 1018 08/12/16 1412  NA 139 138  K 4.1 4.2  GLUCOSE 158* 209*    Studies/Results: No results found.  Assessment/Plan:   LOS: 3 days  Pt will request a Dulcolax suppository this morning. Continue to mobilize in brace.     Verdis Prime 08/15/2016, 8:12 AM   Patient doing well.  Mobilize today.

## 2016-08-15 NOTE — Clinical Social Work Placement (Signed)
   CLINICAL SOCIAL WORK PLACEMENT  NOTE  Date:  08/15/2016  Patient Details  Name: [name] MRN: [id number] Date of Birth: 1941/04/05  Clinical Social Work is seeking post-discharge placement for this patient at the [place] level of care (*CSW will initial, date and re-position this form in  chart as items are completed):  Yes   Patient/family provided with [place] Work Department's list of facilities offering this level of care within the geographic area requested by the patient (or if unable, by the patient's family).  Yes   Patient/family informed of their freedom to choose among providers that offer the needed level of care, that participate in Medicare, Medicaid or managed care program needed by the patient, have an available bed and are willing to accept the patient.  Yes   Patient/family informed of [place]'s ownership interest in [facility] and [facility], as well as of the fact that they are under no obligation to receive care at these facilities.  PASRR submitted to EDS on 08/15/16     PASRR number received on 08/15/16     Existing PASRR number confirmed on       FL2 transmitted to all facilities in geographic area requested by pt/family on 08/15/16     FL2 transmitted to all facilities within larger geographic area on       Patient informed that his/her managed care company has contracts with or will negotiate with certain facilities, including the following:            Patient/family informed of bed offers received.  Patient chooses bed at       Physician recommends and patient chooses bed at      Patient to be transferred to   on  .  Patient to be transferred to facility by       Patient family notified on   of transfer.  Name of family member notified:        PHYSICIAN Please sign FL2     Additional Comment:    _______________________________________________ Lilly Cove, LCSW 08/15/2016, 1:07  PM

## 2016-08-15 NOTE — Clinical Social Work Note (Signed)
Clinical Social Work Assessment  Patient Details  Name: [name] MRN: [id number] Date of Birth: 26-Jun-1940  Date of referral:  08/15/16               Reason for consult:  Facility Placement, Discharge Planning                Permission sought to share information with:  Case Manager, Customer service manager, Family Supports Permission granted to share information::  Yes, Verbal Permission Granted  Name::        Agency::     Relationship::  Daughter  Contact Information:     Housing/Transportation Living arrangements for the past 2 months:  [place] of Information:  Patient, Medical Team, Case Manager, Adult Children Patient Interpreter Needed:  None Criminal Activity/Legal Involvement Pertinent to Current Situation/Hospitalization:  No - Comment as needed Significant Relationships:  Adult Children Lives with:  Self Do you feel safe going back to the place where you live?  No Need for family participation in patient care:  Yes (Comment)  Care giving concerns:  LCSW received consult for patient for SNF. Patient reports her daughter will be able to help her after her ST SNF stay and hopeful for [place] [facility] [place].  Patient reprts she had back surgery and just unable to mobilize and return home safely, thus agreeable to SNF.  Social Worker assessment / plan:  LCSW met with patient alone at bedside. Daughter has been present through most of admission, but left prior to consult. Patient agreeable to SNF. SNF work up completed.  Will follow up with bed offers.  Plan: SNF  Employment status:  Retired Nurse, adult PT Recommendations:  [place] / Referral to community resources:  [place]  Patient/Family's Response to care:  Agreeable to SNF.  Patient/Family's Understanding of and Emotional Response to Diagnosis, Current Treatment, and Prognosis:  Patient voices understanding as well  as realistic expectations of care and safety prior to returning home.  Emotional Assessment Appearance:  Appears stated age Attitude/Demeanor/Rapport:    Affect (typically observed):  Accepting, Adaptable Orientation:  Oriented to Self, Oriented to Place, Oriented to  Time, Oriented to Situation Alcohol / Substance use:  Not Applicable Psych involvement (Current and /or in the community):  No (Comment)  Discharge Needs  Concerns to be addressed:    Readmission within the last 30 days:  No Current discharge risk:  None Barriers to Discharge:  No Barriers Identified, Continued Medical Work up   Lilly Cove, LCSW 08/15/2016, 1:08 PM

## 2016-08-15 NOTE — Care Management Note (Signed)
Case Management Note  Patient Details  Name: SKARLETTE LATTNER MRN: [id number] Date of Birth: [birth year]  Subjective/Objective:              Patient was admitted for an ALIF. Lives at home alone. Current therapy recommendations are for SNF. CM will follow for discharge needs pending patient's progress and physician orders.       Action/Plan:   Expected Discharge Date:                  Expected Discharge Plan:     In-House Referral:     Discharge planning Services     Post Acute Care Choice:    Choice offered to:     DME Arranged:    DME Agency:     HH Arranged:    HH Agency:     Status of Service:     If discussed at [employer] of Stay Meetings, dates discussed:    Additional Comments:  Rolm Baptise, RN 08/15/2016, 10:31 AM

## 2016-08-15 NOTE — Progress Notes (Signed)
Physical Therapy Treatment Patient Details Name: [name] MRN: [id number] DOB: [birth year] Today's Date: 08/15/2016    History of Present Illness Pt is a 76 y.o. female s/p L5-S1 anterior lumbar interbody fusion, L23,L3-4,L4-5 anterolateral lumbar interbody fusion, L2-S1 percutaneous pedicle screws, and abdominal exposure. She has a PMH significant for HTN, diabetes mellitus, hyperlipidemia, DVT, and PE, granulomatous lung disease, asthma, anemia, and GERD.    PT Comments    Pt educated on spinal precautions and able to recall 3/3 with education.  Pt remains limited with mobility secondary to strength and endurance.  Pt continued to require SNF placement at d/c to improve deficits listed below before returning to private residence.  Will continue to progress mobility per patient tolerance during acute hospitalization.    Follow Up Recommendations  SNF;Supervision/Assistance - 24 hour     Equipment Recommendations  None recommended by PT    Recommendations for Other Services OT consult     Precautions / Restrictions Precautions Precautions: Back;Fall Precaution Booklet Issued: Yes (comment) Precaution Comments: Reviewed precautions and handout previously provided by PT. Required Braces or Orthoses: Spinal Brace Spinal Brace: Lumbar corset;Applied in sitting position Restrictions Weight Bearing Restrictions: No    Mobility  Bed Mobility Overal bed mobility: Needs Assistance Bed Mobility: Rolling;Sidelying to Sit Rolling: Min assist Sidelying to sit: Mod assist       General bed mobility comments: Increased time and mod  assist to raise trunk from bed.  Cues for positioning in hooklying in prep for rolling.    Transfers Overall transfer level: Needs assistance Equipment used: Rolling walker (2 wheeled) Transfers: Sit to/from Stand Sit to Stand: Min assist         General transfer comment: Assist to rise to standing. Trembling B LE's once full standing position  achieved.  Pt required cues for upper trunk control and hip extension.    Ambulation/Gait Ambulation/Gait assistance: Min assist Ambulation Distance (Feet): 80 Feet Assistive device: Rolling walker (2 wheeled) Gait Pattern/deviations: Step-through pattern;Trunk flexed;Narrow base of support;Decreased step length - left;Shuffle   Gait velocity interpretation: Below normal speed for age/gender General Gait Details: Pt required cues to increase stride length on LLE.  Cues for upper trunk control and cues for hip extension.     Stairs            Wheelchair Mobility    Modified Rankin (Stroke Patients Only)       Balance Overall balance assessment: Needs assistance   Sitting balance-Leahy Scale: Fair       Standing balance-Leahy Scale: Poor                      Cognition Arousal/Alertness: Awake/alert Behavior During Therapy: WFL for tasks assessed/performed Overall Cognitive Status: Within Functional Limits for tasks assessed                      Exercises      General Comments        Pertinent Vitals/Pain Pain Assessment: Faces Pain Score: 6  Pain Location: back at incision, L side of abdomen, L groin Pain Descriptors / Indicators: Aching;Burning;Discomfort;Grimacing;Guarding Pain Intervention(s): Monitored during session;Repositioned    Home Living                      Prior Function            PT Goals (current goals can now be found in the care plan section) Acute Rehab PT Goals Patient  Stated Goal: return to walking my dog Potential to Achieve Goals: Good Progress towards PT goals: Progressing toward goals    Frequency    Min 5X/week      PT Plan Current plan remains appropriate    Co-evaluation             End of Session Equipment Utilized During Treatment: Gait belt;Back brace Activity Tolerance: Patient tolerated treatment well Patient left: with call bell/phone within reach;with family/visitor  present;in bed;with bed alarm set Nurse Communication: Mobility status;Precautions PT Visit Diagnosis: Unsteadiness on feet (R26.81);Difficulty in walking, not elsewhere classified (R26.2);Muscle weakness (generalized) (M62.81)     Time: 1696-7893 PT Time Calculation (min) (ACUTE ONLY): 27 min  Charges:  $Gait Training: 8-22 mins $Therapeutic Activity: 8-22 mins                    G Codes:       Cristela Blue 09-04-16, 11:22 AM Governor Rooks, PTA pager [phone]

## 2016-08-16 DIAGNOSIS — D649 Anemia, unspecified: Secondary | ICD-10-CM | POA: Diagnosis not present

## 2016-08-16 DIAGNOSIS — M4316 Spondylolisthesis, lumbar region: Secondary | ICD-10-CM | POA: Diagnosis not present

## 2016-08-16 DIAGNOSIS — M8580 Other specified disorders of bone density and structure, unspecified site: Secondary | ICD-10-CM | POA: Diagnosis not present

## 2016-08-16 DIAGNOSIS — E785 Hyperlipidemia, unspecified: Secondary | ICD-10-CM | POA: Diagnosis not present

## 2016-08-16 DIAGNOSIS — M41126 Adolescent idiopathic scoliosis, lumbar region: Secondary | ICD-10-CM | POA: Diagnosis not present

## 2016-08-16 DIAGNOSIS — M48062 Spinal stenosis, lumbar region with neurogenic claudication: Secondary | ICD-10-CM | POA: Diagnosis not present

## 2016-08-16 DIAGNOSIS — M1610 Unilateral primary osteoarthritis, unspecified hip: Secondary | ICD-10-CM | POA: Diagnosis not present

## 2016-08-16 DIAGNOSIS — M5416 Radiculopathy, lumbar region: Secondary | ICD-10-CM | POA: Diagnosis not present

## 2016-08-16 DIAGNOSIS — Z4782 Encounter for orthopedic aftercare following scoliosis surgery: Secondary | ICD-10-CM | POA: Diagnosis not present

## 2016-08-16 DIAGNOSIS — J309 Allergic rhinitis, unspecified: Secondary | ICD-10-CM | POA: Diagnosis not present

## 2016-08-16 DIAGNOSIS — R0989 Other specified symptoms and signs involving the circulatory and respiratory systems: Secondary | ICD-10-CM | POA: Diagnosis not present

## 2016-08-16 DIAGNOSIS — E1151 Type 2 diabetes mellitus with diabetic peripheral angiopathy without gangrene: Secondary | ICD-10-CM | POA: Diagnosis not present

## 2016-08-16 DIAGNOSIS — J45909 Unspecified asthma, uncomplicated: Secondary | ICD-10-CM | POA: Diagnosis not present

## 2016-08-16 MED ORDER — HYDROCODONE-ACETAMINOPHEN 5-325 MG PO TABS: 1.0000 | ORAL_TABLET | ORAL | 0 refills | Status: DC | PRN

## 2016-08-16 MED ORDER — MANAGING BACK PAIN BOOK
Freq: Once | Status: AC
  Administered 2016-08-16: 05:00:00
  Filled 2016-08-16: qty 1

## 2016-08-16 NOTE — Clinical Social Work Note (Signed)
CSW facilitated patient discharge including contacting patient family and facility to confirm patient discharge plans. Clinical information faxed to facility and family agreeable with plan. Patient's daughter to transport. CSW explained to patient and her daughter that insurance will likely not cover PTAR as she is walking 100 feet and is not wearing oxygen. RN to call report prior to discharge [phone] [place]).  CSW will sign off for now as social work intervention is no longer needed. Please consult [place] again if new needs arise.  Dayton Scrape, [place]

## 2016-08-16 NOTE — Progress Notes (Signed)
Physical Therapy Treatment Patient Details Name: [name] MRN: [id number] DOB: January 29, 1941 Today's Date: 08/16/2016    History of Present Illness Pt is a 76 y.o. female s/p L5-S1 anterior lumbar interbody fusion, L23,L3-4,L4-5 anterolateral lumbar interbody fusion, L2-S1 percutaneous pedicle screws, and abdominal exposure. She has a PMH significant for HTN, diabetes mellitus, hyperlipidemia, DVT, and PE, granulomatous lung disease, asthma, anemia, and GERD.    PT Comments    Pt performed increased gait and motivated to perform this session.  Pt with plans to d/c to SNF this pm.   Pt will require continued rehab to improve strength and endurance before returning to private residence.    Follow Up Recommendations  SNF;Supervision/Assistance - 24 hour     Equipment Recommendations  None recommended by PT    Recommendations for Other Services OT consult     Precautions / Restrictions Precautions Precautions: Back;Fall Precaution Booklet Issued: Yes (comment) Precaution Comments: Reviewed precautions and handout previously provided by PT. Required Braces or Orthoses: Spinal Brace Spinal Brace: Lumbar corset;Applied in sitting position (Pt sitting with brace in recliner, removed and retightened brace for improved fit.  ) Restrictions Weight Bearing Restrictions: No    Mobility  Bed Mobility               General bed mobility comments: Pt sitting in recliner on arrival at this time  Transfers Overall transfer level: Needs assistance Equipment used: Rolling walker (2 wheeled) Transfers: Sit to/from Stand Sit to Stand: Min assist         General transfer comment: Pt remains to require min assist to boost into standing.  Pt required cues for hand placement to and from seated surface and cues for trunk extension.     Ambulation/Gait Ambulation/Gait assistance: Min guard Ambulation Distance (Feet): 100 Feet Assistive device: Rolling walker (2 wheeled) Gait  Pattern/deviations: Step-through pattern;Trunk flexed;Narrow base of support;Decreased step length - left;Shuffle   Gait velocity interpretation: Below normal speed for age/gender General Gait Details: Pt required cues to increase stride length on LLE.  Cues for upper trunk control and cues for hip extension.  Pt with improved gait distance and endurance.  pt motivated this morning.     Stairs            Wheelchair Mobility    Modified Rankin (Stroke Patients Only)       Balance     Sitting balance-Leahy Scale: Fair       Standing balance-Leahy Scale: Poor                      Cognition Arousal/Alertness: Awake/alert Behavior During Therapy: WFL for tasks assessed/performed Overall Cognitive Status: Within Functional Limits for tasks assessed                      Exercises      General Comments        Pertinent Vitals/Pain Pain Assessment: 0-10 Pain Score: 3  Pain Location: back at incision, L side of abdomen, L groin Pain Descriptors / Indicators: Aching;Burning;Discomfort;Grimacing;Guarding Pain Intervention(s): Limited activity within patient's tolerance    Home Living                      Prior Function            PT Goals (current goals can now be found in the care plan section) Acute Rehab PT Goals Patient Stated Goal: return to walking my dog Potential to Achieve  Goals: Good Progress towards PT goals: Progressing toward goals    Frequency    Min 5X/week      PT Plan Current plan remains appropriate    Co-evaluation             End of Session Equipment Utilized During Treatment: Gait belt;Back brace Activity Tolerance: Patient tolerated treatment well Patient left: with call bell/phone within reach;with family/visitor present (Pt sitting on commode and wish to sit longer to complete BM.  Educated patient on use of call bell in rest room.  Pt will require assist back from rest room her children are present  and understand.  ) Nurse Communication: Mobility status;Precautions PT Visit Diagnosis: Unsteadiness on feet (R26.81);Difficulty in walking, not elsewhere classified (R26.2);Muscle weakness (generalized) (M62.81)     Time: 0045-9977 PT Time Calculation (min) (ACUTE ONLY): 20 min  Charges:  $Gait Training: 8-22 mins                    G Codes:       Cristela Blue 09-12-2016, 9:40 AM Governor Rooks, PTA pager [phone]

## 2016-08-16 NOTE — Progress Notes (Addendum)
Subjective: Patient reports "I'm doing ok.[name] KitchenMarland KitchenI walked out in the hall. My bowels have moved and my stomach feels better"  Objective: Vital signs in last 24 hours: Temp:  [98 F (36.7 C)-100.1 F (37.8 C)] 98.3 F (36.8 C) (03/20 0509) Pulse Rate:  [86-102] 86 (03/20 0509) Resp:  [18] 18 (03/20 0509) BP: (118-155)/(57-83) 138/64 (03/20 0509) SpO2:  [94 %-96 %] 95 % (03/20 0509)  Intake/Output from previous day: 03/19 0701 - 03/20 0700 In: 120 [P.O.:120] Out: 1200 [Urine:1200] Intake/Output this shift: Total I/O In: 237 [P.O.:237] Out: -   Alert, conversant. Son present. She reports no pain at present. Incisions left abdomen, left side, and lumbar region are all without erythema, swelling, or drainage beneath honeycomb and Dermabond. Strength is good BLE. Belly is soft, less distended than yesterday. BM last night, passing gas.   Lab Results: No results for input(s): WBC, HGB, HCT, PLT in the last 72 hours. BMET No results for input(s): NA, K, CL, CO2, GLUCOSE, BUN, CREATININE, CALCIUM in the last 72 hours.  Studies/Results: No results found.  Assessment/Plan: Improving   LOS: 4 days  Per DrStern, ok to d/c to SNF ([name]). Pt verbalizes understanding of d/c instructions and agrees to call office to scheule f/u appt in 3-4 weeks. Rx's Flexeril & Norco will be sent with pt to SNF. Continue Gabapentin as well.    Verdis Prime 08/16/2016, 7:47 AM  Patient making good progress.  Discharge to [facility] today.

## 2016-08-16 NOTE — Care Management Important Message (Signed)
Important Message  Patient Details  Name: [name] MRN: [id number] Date of Birth: Feb 28, 1941   Medicare Important Message Given:  Yes    Brach Birdsall Montine Circle 08/16/2016, 11:39 AM

## 2016-08-16 NOTE — Progress Notes (Addendum)
Pt D/C to facility, no new concern, D/C teaching provided with teach back, Pt's daughter is transporting pt from the hospital.report called into facility Nurse. Tania

## 2016-08-16 NOTE — Consult Note (Signed)
[facility] CM Primary Care Navigator  08/16/2016  [name] 30-Apr-1941 [id number]   Met with patient and daughter [name]) at the bedside to identify possible discharge needs. Patient reports having back issues causing pressure to right leg and pain to left side that had led to this admission/surgery.  Patient endorses Dr. Garnet Koyanagi- [name] with [facility] as the primary care provider.    Patient shared using [facility] pharmacy at [address] to obtain medications without any problem.   Patient reports managing her own medications at home straight out of the containers.  Patient lives alone and able to drive prior to admission/ surgery. Daughter or son [name]) can provide transportation to her doctors' appointments after discharge.  Patient's daughter and son will be assisting with care at home when patient returns home.    Discharge plan is skilled nursing facility [facility]) for short term rehabilitation before going back home according to patient.  Patient and daughter voiced understanding to call primary care provider's office when she returns home, for a post discharge follow-up appointment within a week or sooner if needed. Patient letter (with PCP's contact number) was provided as a reminder.  Explained to patient about [facility] CM services available for health management but she states that her DM is being managed well at home with the help of primary care provider who monitors her every 6 months and with recent A1c of 6.2. [facility] care management contact information provided for future needs that may arise.   For questions, please contact:  Dannielle Huh, BSN, RN- [facility] Primary Care Navigator  Telephone: [phone] [place]

## 2016-08-16 NOTE — Discharge Summary (Signed)
Physician Discharge Summary  Patient ID: [name] MRN: [id number] DOB/AGE: September 18, 1940 76 y.o.  Admit date: 08/12/2016 Discharge date: 08/16/2016  Admission Diagnoses: Spinal stenosis of lumbar region with neurogenic claudication, spondylolisthesis, scoliosis, radiculopathy L 23, L 34, L 45, L 5 S1 levels     Discharge Diagnoses: Spinal stenosis of lumbar region with neurogenic claudication, spondylolisthesis, scoliosis, radiculopathy L 23, L 34, L 45, L 5 S1 levels s/p Lumbar Five-Sacral One Anterior lumbar interbody fusion with Dr. Sherren Mocha Early to assist (N/A) - L5-S1 Anterior lumbar interbody fusion with Dr. Sherren Mocha Early to assist LUMBAR TWO-THREE, LUMBAR THREE-FOUR, LUMBAR FOUR-FIVE  ANTEROLATERAL LUMBAR INTERBODY FUSION (N/A) - L2-3 L3-4 L4-5 Anterolateral lumbar interbody fusion LUMBAR TWO-SACRAL ONE Percuataneous Pedicle Screws (N/A) ABDOMINAL EXPOSURE (N/A)    Active Problems:   Lumbar scoliosis   Discharged Condition: good  Hospital Course: [name] was admitted for surgery with dx lumbar stenosis, spondylolisthesis, and radiculopathy. Following uncomplicated ALIF, XLIF, and posterior fixation L2-S1, she has mobilized steadily.   Consults: None  Significant Diagnostic Studies: radiology: X-Ray: intra-op  Treatments: surgery: Lumbar Five-Sacral One Anterior lumbar interbody fusion with Dr. Sherren Mocha Early to assist (N/A) - L5-S1 Anterior lumbar interbody fusion with Dr. Sherren Mocha Early to assist LUMBAR TWO-THREE, LUMBAR THREE-FOUR, LUMBAR FOUR-FIVE  ANTEROLATERAL LUMBAR INTERBODY FUSION (N/A) - L2-3 L3-4 L4-5 Anterolateral lumbar interbody fusion LUMBAR TWO-SACRAL ONE Percuataneous Pedicle Screws (N/A) ABDOMINAL EXPOSURE (N/A)     Discharge Exam: Blood pressure 138/64, pulse 86, temperature 98.3 F (36.8 C), temperature source Oral, resp. rate 18, height 5\' 3"  (1.6 m), weight 68.9 kg (151 lb 14.4 oz), SpO2 95 %. Alert, conversant. Son present. She reports no pain at present.  Incisions left abdomen, left side, and lumbar region are all without erythema, swelling, or drainage beneath honeycomb and Dermabond. Strength is good BLE. Belly is soft, less distended than yesterday. BM last night, passing gas.      Disposition: Discharge to SNF. ([name]). Pt verbalizes understanding of d/c instructions and agrees to call office to scheule f/u appt in 3-4 weeks. Rx's Flexeril & Norco will be sent with pt to SNF. Continue Gabapentin as well. Ok to shower.  Ok to remove drsgs tomorrow. Dermabond will wash off in 2-3 weeks.       Allergies as of 08/16/2016      Reactions   Statins Other (See Comments)   Myalgias and muscle weakness      Medication List    STOP taking these medications   meloxicam 15 MG tablet Commonly known as:  MOBIC     TAKE these medications   albuterol 108 (90 Base) MCG/ACT inhaler Commonly known as:  PROAIR HFA Inhale 2 puffs into the lungs every 6 (six) hours as needed for wheezing or shortness of breath.   alendronate 70 MG tablet Commonly known as:  FOSAMAX Take 1 tablet (70 mg total) by mouth every 7 (seven) days. Take with a full glass of water on an empty stomach.   aspirin 81 MG tablet Take 81 mg by mouth daily.   CALCIUM 500 + D3 250-500 MG-UNIT Chew Generic drug:  Ca Phosphate-Cholecalciferol Chew by mouth.   cholecalciferol 1000 units tablet Commonly known as:  VITAMIN D Take 1,000 Units by mouth daily.   cyclobenzaprine 10 MG tablet Commonly known as:  FLEXERIL TAKE 1 TABLET BY MOUTH AS NEEDED FOR HIP CONCERNS   docusate sodium 100 MG capsule Commonly known as:  COLACE Take 100-200 mg by mouth every morning.   Fluticasone-Salmeterol  250-50 MCG/DOSE Aepb Commonly known as:  ADVAIR DISKUS Use 1 inhalation two times  daily ;rinse mouth   gabapentin 300 MG capsule Commonly known as:  NEURONTIN Take 300 mg by mouth 3 (three) times daily.   glucose blood test strip Commonly known as:  ONETOUCH VERIO Check blood  sugar twice daily   HYDROcodone-acetaminophen 5-325 MG tablet Commonly known as:  NORCO/VICODIN Take 1 tablet by mouth every 6 (six) hours as needed for moderate pain. What changed:  Another medication with the same name was added. Make sure you understand how and when to take each.   HYDROcodone-acetaminophen 5-325 MG tablet Commonly known as:  NORCO/VICODIN Take 1-2 tablets by mouth every 4 (four) hours as needed for moderate pain. What changed:  You were already taking a medication with the same name, and this prescription was added. Make sure you understand how and when to take each.   metFORMIN 500 MG 24 hr tablet Commonly known as:  GLUCOPHAGE-XR Take 1 tablet (500 mg total) by mouth daily with breakfast.   multivitamin with minerals Tabs tablet Take 1 tablet by mouth daily.   MYRBETRIQ 50 MG Tb24 tablet Generic drug:  mirabegron ER TAKE 1 TABLET(50 MG) BY MOUTH DAILY   OCUVITE EXTRA PO Take 1 tablet by mouth daily.   omeprazole 20 MG capsule Commonly known as:  PRILOSEC Take 20 mg by mouth daily as needed.   ONETOUCH DELICA LANCETS FINE Misc Check blood sugar twice daily   polyethylene glycol packet Commonly known as:  MIRALAX / GLYCOLAX Take 17 g by mouth daily.      Contact information for after-discharge care    Destination    [facility] .   Specialty:  [place] information: [address] [place] [phone]              Signed: Peggyann Shoals, MD 08/16/2016, 9:11 AM

## 2016-08-16 NOTE — Care Management Note (Signed)
Case Management Note  Patient Details  Name: TISHIA MAESTRE MRN: [id number] Date of Birth: 09-05-1940  Subjective/Objective:                    Action/Plan: Pt discharging to [place] today. No further needs per CM.   Expected Discharge Date:  08/16/16               Expected Discharge Plan:  [place]  In-House Referral:     Discharge planning Services     Post Acute Care Choice:    Choice offered to:     DME Arranged:    DME Agency:     HH Arranged:    [place] Agency:     Status of Service:  Completed, signed off  If discussed at [employer], dates discussed:    Additional Comments:  Pollie Friar, RN 08/16/2016, 11:31 AM

## 2016-08-16 NOTE — Clinical Social Work Placement (Signed)
   CLINICAL SOCIAL WORK PLACEMENT  NOTE  Date:  08/16/2016  Patient Details  Name: [name] MRN: [id number] Date of Birth: [birth year]  Clinical Social Work is seeking post-discharge placement for this patient at the [place] level of care (*CSW will initial, date and re-position this form in  chart as items are completed):  Yes   Patient/family provided with [place] Work Department's list of facilities offering this level of care within the geographic area requested by the patient (or if unable, by the patient's family).  Yes   Patient/family informed of their freedom to choose among providers that offer the needed level of care, that participate in Medicare, Medicaid or managed care program needed by the patient, have an available bed and are willing to accept the patient.  Yes   Patient/family informed of [place]'s ownership interest in [facility] and [facility], as well as of the fact that they are under no obligation to receive care at these facilities.  PASRR submitted to EDS on 08/15/16     PASRR number received on 08/15/16     Existing PASRR number confirmed on       FL2 transmitted to all facilities in geographic area requested by pt/family on 08/15/16     FL2 transmitted to all facilities within larger geographic area on       Patient informed that his/her managed care company has contracts with or will negotiate with certain facilities, including the following:        Yes   Patient/family informed of bed offers received.  Patient chooses bed at [facility] recommends and patient chooses bed at      Patient to be transferred to [facility] on 08/16/16.  Patient to be transferred to facility by Daughter's car     Patient family notified on 08/16/16 of transfer.  Name of family member notified:  Lanae Boast     PHYSICIAN Please prepare prescriptions     Additional Comment:     _______________________________________________ Candie Chroman, LCSW 08/16/2016, 10:48 AM

## 2016-09-01 DIAGNOSIS — E119 Type 2 diabetes mellitus without complications: Secondary | ICD-10-CM | POA: Diagnosis not present

## 2016-09-01 DIAGNOSIS — Z7982 Long term (current) use of aspirin: Secondary | ICD-10-CM | POA: Diagnosis not present

## 2016-09-01 DIAGNOSIS — Z981 Arthrodesis status: Secondary | ICD-10-CM | POA: Diagnosis not present

## 2016-09-01 DIAGNOSIS — Z86711 Personal history of pulmonary embolism: Secondary | ICD-10-CM | POA: Diagnosis not present

## 2016-09-01 DIAGNOSIS — Z7984 Long term (current) use of oral hypoglycemic drugs: Secondary | ICD-10-CM | POA: Diagnosis not present

## 2016-09-01 DIAGNOSIS — I10 Essential (primary) hypertension: Secondary | ICD-10-CM | POA: Diagnosis not present

## 2016-09-01 DIAGNOSIS — J984 Other disorders of lung: Secondary | ICD-10-CM | POA: Diagnosis not present

## 2016-09-01 DIAGNOSIS — Z4782 Encounter for orthopedic aftercare following scoliosis surgery: Secondary | ICD-10-CM | POA: Diagnosis not present

## 2016-09-01 DIAGNOSIS — J45909 Unspecified asthma, uncomplicated: Secondary | ICD-10-CM | POA: Diagnosis not present

## 2016-09-01 DIAGNOSIS — Z86718 Personal history of other venous thrombosis and embolism: Secondary | ICD-10-CM | POA: Diagnosis not present

## 2016-09-05 DIAGNOSIS — M5416 Radiculopathy, lumbar region: Secondary | ICD-10-CM | POA: Diagnosis not present

## 2016-09-05 DIAGNOSIS — I10 Essential (primary) hypertension: Secondary | ICD-10-CM | POA: Diagnosis not present

## 2016-09-06 DIAGNOSIS — I10 Essential (primary) hypertension: Secondary | ICD-10-CM | POA: Diagnosis not present

## 2016-09-06 DIAGNOSIS — J984 Other disorders of lung: Secondary | ICD-10-CM | POA: Diagnosis not present

## 2016-09-06 DIAGNOSIS — Z4782 Encounter for orthopedic aftercare following scoliosis surgery: Secondary | ICD-10-CM | POA: Diagnosis not present

## 2016-09-06 DIAGNOSIS — J45909 Unspecified asthma, uncomplicated: Secondary | ICD-10-CM | POA: Diagnosis not present

## 2016-09-06 DIAGNOSIS — Z7982 Long term (current) use of aspirin: Secondary | ICD-10-CM | POA: Diagnosis not present

## 2016-09-06 DIAGNOSIS — E119 Type 2 diabetes mellitus without complications: Secondary | ICD-10-CM | POA: Diagnosis not present

## 2016-09-06 DIAGNOSIS — Z86711 Personal history of pulmonary embolism: Secondary | ICD-10-CM | POA: Diagnosis not present

## 2016-09-06 DIAGNOSIS — Z86718 Personal history of other venous thrombosis and embolism: Secondary | ICD-10-CM | POA: Diagnosis not present

## 2016-09-06 DIAGNOSIS — Z7984 Long term (current) use of oral hypoglycemic drugs: Secondary | ICD-10-CM | POA: Diagnosis not present

## 2016-09-06 DIAGNOSIS — Z981 Arthrodesis status: Secondary | ICD-10-CM | POA: Diagnosis not present

## 2016-09-07 ENCOUNTER — Telehealth: Payer: Self-pay

## 2016-09-07 NOTE — Telephone Encounter (Signed)
[name] was discharged from [facility] skilled rehab facility on 08/31/16.  Per Dr. Etter Sjogren, pt needs a 1 month follow up.  Please call patient and see how she's doing and schedule 1 month follow up.

## 2016-09-08 DIAGNOSIS — Z7984 Long term (current) use of oral hypoglycemic drugs: Secondary | ICD-10-CM | POA: Diagnosis not present

## 2016-09-08 DIAGNOSIS — Z86718 Personal history of other venous thrombosis and embolism: Secondary | ICD-10-CM | POA: Diagnosis not present

## 2016-09-08 DIAGNOSIS — J984 Other disorders of lung: Secondary | ICD-10-CM | POA: Diagnosis not present

## 2016-09-08 DIAGNOSIS — J45909 Unspecified asthma, uncomplicated: Secondary | ICD-10-CM | POA: Diagnosis not present

## 2016-09-08 DIAGNOSIS — Z981 Arthrodesis status: Secondary | ICD-10-CM | POA: Diagnosis not present

## 2016-09-08 DIAGNOSIS — Z86711 Personal history of pulmonary embolism: Secondary | ICD-10-CM | POA: Diagnosis not present

## 2016-09-08 DIAGNOSIS — I10 Essential (primary) hypertension: Secondary | ICD-10-CM | POA: Diagnosis not present

## 2016-09-08 DIAGNOSIS — Z4782 Encounter for orthopedic aftercare following scoliosis surgery: Secondary | ICD-10-CM | POA: Diagnosis not present

## 2016-09-08 DIAGNOSIS — E119 Type 2 diabetes mellitus without complications: Secondary | ICD-10-CM | POA: Diagnosis not present

## 2016-09-08 DIAGNOSIS — Z7982 Long term (current) use of aspirin: Secondary | ICD-10-CM | POA: Diagnosis not present

## 2016-09-08 NOTE — Telephone Encounter (Signed)
Unable to reach patient at this time. Left message for patient to return call when available.    

## 2016-09-12 DIAGNOSIS — Z7982 Long term (current) use of aspirin: Secondary | ICD-10-CM | POA: Diagnosis not present

## 2016-09-12 DIAGNOSIS — J45909 Unspecified asthma, uncomplicated: Secondary | ICD-10-CM | POA: Diagnosis not present

## 2016-09-12 DIAGNOSIS — Z86711 Personal history of pulmonary embolism: Secondary | ICD-10-CM | POA: Diagnosis not present

## 2016-09-12 DIAGNOSIS — J984 Other disorders of lung: Secondary | ICD-10-CM | POA: Diagnosis not present

## 2016-09-12 DIAGNOSIS — Z86718 Personal history of other venous thrombosis and embolism: Secondary | ICD-10-CM | POA: Diagnosis not present

## 2016-09-12 DIAGNOSIS — Z981 Arthrodesis status: Secondary | ICD-10-CM | POA: Diagnosis not present

## 2016-09-12 DIAGNOSIS — I10 Essential (primary) hypertension: Secondary | ICD-10-CM | POA: Diagnosis not present

## 2016-09-12 DIAGNOSIS — Z7984 Long term (current) use of oral hypoglycemic drugs: Secondary | ICD-10-CM | POA: Diagnosis not present

## 2016-09-12 DIAGNOSIS — Z4782 Encounter for orthopedic aftercare following scoliosis surgery: Secondary | ICD-10-CM | POA: Diagnosis not present

## 2016-09-12 DIAGNOSIS — E119 Type 2 diabetes mellitus without complications: Secondary | ICD-10-CM | POA: Diagnosis not present

## 2016-09-12 NOTE — Telephone Encounter (Signed)
Patient has a follow-up appointment scheduled for 09/19/16 at 11:30 AM w/ PCP.

## 2016-09-13 DIAGNOSIS — Z4782 Encounter for orthopedic aftercare following scoliosis surgery: Secondary | ICD-10-CM | POA: Diagnosis not present

## 2016-09-13 DIAGNOSIS — Z86711 Personal history of pulmonary embolism: Secondary | ICD-10-CM | POA: Diagnosis not present

## 2016-09-13 DIAGNOSIS — Z7984 Long term (current) use of oral hypoglycemic drugs: Secondary | ICD-10-CM | POA: Diagnosis not present

## 2016-09-13 DIAGNOSIS — Z981 Arthrodesis status: Secondary | ICD-10-CM | POA: Diagnosis not present

## 2016-09-13 DIAGNOSIS — I10 Essential (primary) hypertension: Secondary | ICD-10-CM | POA: Diagnosis not present

## 2016-09-13 DIAGNOSIS — J984 Other disorders of lung: Secondary | ICD-10-CM | POA: Diagnosis not present

## 2016-09-13 DIAGNOSIS — E119 Type 2 diabetes mellitus without complications: Secondary | ICD-10-CM | POA: Diagnosis not present

## 2016-09-13 DIAGNOSIS — J45909 Unspecified asthma, uncomplicated: Secondary | ICD-10-CM | POA: Diagnosis not present

## 2016-09-13 DIAGNOSIS — Z7982 Long term (current) use of aspirin: Secondary | ICD-10-CM | POA: Diagnosis not present

## 2016-09-13 DIAGNOSIS — Z86718 Personal history of other venous thrombosis and embolism: Secondary | ICD-10-CM | POA: Diagnosis not present

## 2016-09-14 DIAGNOSIS — J984 Other disorders of lung: Secondary | ICD-10-CM | POA: Diagnosis not present

## 2016-09-14 DIAGNOSIS — Z7984 Long term (current) use of oral hypoglycemic drugs: Secondary | ICD-10-CM | POA: Diagnosis not present

## 2016-09-14 DIAGNOSIS — Z86718 Personal history of other venous thrombosis and embolism: Secondary | ICD-10-CM | POA: Diagnosis not present

## 2016-09-14 DIAGNOSIS — I10 Essential (primary) hypertension: Secondary | ICD-10-CM | POA: Diagnosis not present

## 2016-09-14 DIAGNOSIS — J45909 Unspecified asthma, uncomplicated: Secondary | ICD-10-CM | POA: Diagnosis not present

## 2016-09-14 DIAGNOSIS — Z7982 Long term (current) use of aspirin: Secondary | ICD-10-CM | POA: Diagnosis not present

## 2016-09-14 DIAGNOSIS — Z981 Arthrodesis status: Secondary | ICD-10-CM | POA: Diagnosis not present

## 2016-09-14 DIAGNOSIS — Z4782 Encounter for orthopedic aftercare following scoliosis surgery: Secondary | ICD-10-CM | POA: Diagnosis not present

## 2016-09-14 DIAGNOSIS — Z86711 Personal history of pulmonary embolism: Secondary | ICD-10-CM | POA: Diagnosis not present

## 2016-09-14 DIAGNOSIS — E119 Type 2 diabetes mellitus without complications: Secondary | ICD-10-CM | POA: Diagnosis not present

## 2016-09-19 ENCOUNTER — Ambulatory Visit (INDEPENDENT_AMBULATORY_CARE_PROVIDER_SITE_OTHER): Payer: Medicare Other | Admitting: Family Medicine

## 2016-09-19 ENCOUNTER — Encounter: Payer: Self-pay | Admitting: Family Medicine

## 2016-09-19 VITALS — BP 102/58 | HR 77 | Temp 98.2°F | Resp 17 | Ht 63.0 in | Wt 145.8 lb

## 2016-09-19 DIAGNOSIS — R601 Generalized edema: Secondary | ICD-10-CM | POA: Diagnosis not present

## 2016-09-19 DIAGNOSIS — E782 Mixed hyperlipidemia: Secondary | ICD-10-CM | POA: Diagnosis not present

## 2016-09-19 DIAGNOSIS — E1151 Type 2 diabetes mellitus with diabetic peripheral angiopathy without gangrene: Secondary | ICD-10-CM | POA: Diagnosis not present

## 2016-09-19 LAB — COMPREHENSIVE METABOLIC PANEL
ALBUMIN: 3.8 g/dL (ref 3.5–5.2)
ALK PHOS: 77 U/L (ref 39–117)
ALT: 11 U/L (ref 0–35)
AST: 15 U/L (ref 0–37)
BILIRUBIN TOTAL: 0.3 mg/dL (ref 0.2–1.2)
BUN: 11 mg/dL (ref 6–23)
CO2: 26 mEq/L (ref 19–32)
Calcium: 9.7 mg/dL (ref 8.4–10.5)
Chloride: 103 mEq/L (ref 96–112)
Creatinine, Ser: 0.76 mg/dL (ref 0.40–1.20)
GFR: 78.72 mL/min (ref >60.00)
Glucose, Bld: 110 mg/dL — ABNORMAL HIGH (ref 70–99)
Potassium: 3.9 mEq/L (ref 3.5–5.1)
SODIUM: 136 meq/L (ref 135–145)
Total Protein: 6.9 g/dL (ref 6.0–8.3)

## 2016-09-19 LAB — LIPID PANEL
Cholesterol: 173 mg/dL (ref 0–200)
HDL: 47.2 mg/dL
NonHDL: 125.35
Total CHOL/HDL Ratio: 4
Triglycerides: 228 mg/dL — ABNORMAL HIGH (ref 0.0–149.0)
VLDL: 45.6 mg/dL — ABNORMAL HIGH (ref 0.0–40.0)

## 2016-09-19 LAB — LDL CHOLESTEROL, DIRECT: Direct LDL: 91 mg/dL

## 2016-09-19 MED ORDER — FUROSEMIDE 20 MG PO TABS: 20.0000 mg | ORAL_TABLET | Freq: Every day | ORAL | 3 refills | Status: DC

## 2016-09-19 NOTE — Progress Notes (Signed)
Patient ID: [name], female    DOB: Nov 13, 1940  Age: 76 y.o. MRN: [id number]    Subjective:  Subjective  HPI [name] presents for f/u from hospital -- she had fusion L2-S1 with Dr Vertell Limber and was just d/c from [facility] for snf. She is doing well and has no complaints.  Pt needs f/u for dm, cholesterol.   She is c/o edema low ext.    Review of Systems  Constitutional: Negative for appetite change, diaphoresis, fatigue and unexpected weight change.  Eyes: Negative for pain, redness and visual disturbance.  Respiratory: Negative for cough, chest tightness, shortness of breath and wheezing.   Cardiovascular: Negative for chest pain, palpitations and leg swelling.  Endocrine: Negative for cold intolerance, heat intolerance, polydipsia, polyphagia and polyuria.  Genitourinary: Negative for difficulty urinating, dysuria and frequency.  Neurological: Negative for dizziness, light-headedness, numbness and headaches.    History Past Medical History:  Diagnosis Date  . Anemia   . Arthritis   . Asthma    adult onset  . Basal cell cancer    LUE; Porokeratosis also  . Chronic kidney disease 1963   strep in kidney due to strep throat-hospitalized 10 days  . Complication of anesthesia    small trachea  . Diabetes mellitus 2010   A1c 6.7%  . DVT (deep venous thrombosis) ([place]) 2006   post immobilization post cns surgery  . GERD (gastroesophageal reflux disease)    very mild  . Granulomatous lung disease ([place]) 2002   incidental Xray finding  . Hyperlipidemia   . Hypertension 2004   Hypertensive response on Stress Test  . Paralysis ([place]) 2006   post cervical fusion with spinal sac tear  with hematoma   . PTE (pulmonary thromboembolism) ([place]) 2006    She has a past surgical history that includes Bunionectomy; Septoplasty; Tubal ligation; Colonoscopy (1992 & 2002); Rotator cuff repair (2009); epidural steroids (2006); Cervical fusion (2006); Tonsillectomy (76 years old); Total hip  arthroplasty (06/01/2012); Anterior lumbar fusion (N/A, 08/12/2016); Anterior lat lumbar fusion (N/A, 08/12/2016); Lumbar percutaneous pedicle screw 4 level (N/A, 08/12/2016); and Abdominal exposure (N/A, 08/12/2016).   Her family history includes COPD in her father; Cancer in her mother; Diabetes in her sister; Heart disease in her sister; Stroke in her maternal grandmother; Transient ischemic attack in her paternal aunt.She reports that she has never smoked. She has never used smokeless tobacco. She reports that she drinks alcohol. She reports that she does not use drugs.  Current Outpatient Prescriptions on File Prior to Visit  Medication Sig Dispense Refill  . albuterol (PROAIR HFA) 108 (90 Base) MCG/ACT inhaler Inhale 2 puffs into the lungs every 6 (six) hours as needed for wheezing or shortness of breath. 3 Inhaler 3  . alendronate (FOSAMAX) 70 MG tablet Take 1 tablet (70 mg total) by mouth every 7 (seven) days. Take with a full glass of water on an empty stomach. 4 tablet 11  . aspirin 81 MG tablet Take 81 mg by mouth daily.    . Ca Phosphate-Cholecalciferol (CALCIUM 500 + D3) 250-500 MG-UNIT CHEW Chew by mouth.    . cholecalciferol (VITAMIN D) 1000 UNITS tablet Take 1,000 Units by mouth daily.     . cyclobenzaprine (FLEXERIL) 10 MG tablet TAKE 1 TABLET BY MOUTH AS NEEDED FOR HIP CONCERNS 30 tablet 0  . docusate sodium (COLACE) 100 MG capsule Take 100-200 mg by mouth every morning.    . Fluticasone-Salmeterol (ADVAIR DISKUS) 250-50 MCG/DOSE AEPB Use 1 inhalation two  times  daily ;rinse mouth 180 each 3  . gabapentin (NEURONTIN) 300 MG capsule Take 300 mg by mouth 3 (three) times daily.   1  . glucose blood (ONETOUCH VERIO) test strip Check blood sugar twice daily 200 each 3  . HYDROcodone-acetaminophen (NORCO/VICODIN) 5-325 MG tablet Take 1 tablet by mouth every 6 (six) hours as needed for moderate pain.   0  . metFORMIN (GLUCOPHAGE-XR) 500 MG 24 hr tablet Take 1 tablet (500 mg total) by mouth  daily with breakfast. 90 tablet 6  . Multiple Vitamin (MULTIVITAMIN WITH MINERALS) TABS tablet Take 1 tablet by mouth daily.    . Multiple Vitamins-Minerals (OCUVITE EXTRA PO) Take 1 tablet by mouth daily.    [name] MYRBETRIQ 50 MG TB24 tablet TAKE 1 TABLET(50 MG) BY MOUTH DAILY 30 tablet 5  . omeprazole (PRILOSEC) 20 MG capsule Take 20 mg by mouth daily as needed.    Glory Rosebush DELICA LANCETS FINE MISC Check blood sugar twice daily 200 each 3  . polyethylene glycol (MIRALAX / GLYCOLAX) packet Take 17 g by mouth daily.     No current facility-administered medications on file prior to visit.      Objective:  Objective  Physical Exam  Constitutional: She is oriented to person, place, and time. She appears well-developed and well-nourished.  HENT:  Head: Normocephalic and atraumatic.  Eyes: Conjunctivae and EOM are normal.  Neck: Normal range of motion. Neck supple. No JVD present. Carotid bruit is not present. No thyromegaly present.  Cardiovascular: Normal rate, regular rhythm and normal heart sounds.   No murmur heard. Pulmonary/Chest: Effort normal and breath sounds normal. No respiratory distress. She has no wheezes. She has no rales. She exhibits no tenderness.  Musculoskeletal: She exhibits edema.  Neurological: She is alert and oriented to person, place, and time.  Psychiatric: She has a normal mood and affect. Her behavior is normal. Judgment and thought content normal.  Nursing note and vitals reviewed.  BP (!) 102/58 (BP Location: Left Arm, Patient Position: Sitting, Cuff Size: Normal)   Pulse 77   Temp 98.2 F (36.8 C) (Oral)   Resp 17   Ht 5\' 3"  (1.6 m)   Wt 145 lb 12.8 oz (66.1 kg)   SpO2 99%   BMI 25.83 kg/m  Wt Readings from Last 3 Encounters:  09/19/16 145 lb 12.8 oz (66.1 kg)  08/14/16 151 lb 14.4 oz (68.9 kg)  08/03/16 149 lb 3.2 oz (67.7 kg)     Lab Results  Component Value Date   WBC 7.0 08/03/2016   HGB 9.5 (L) 08/12/2016   HCT 28.0 (L) 08/12/2016   PLT  241 08/03/2016   GLUCOSE 209 (H) 08/12/2016   CHOL 229 (H) 11/24/2015   TRIG 146.0 11/24/2015   HDL 57.00 11/24/2015   LDLDIRECT 125.7 06/02/2014   LDLCALC 143 (H) 11/24/2015   ALT 18 06/03/2016   AST 18 06/03/2016   NA 138 08/12/2016   K 4.2 08/12/2016   CL 108 08/03/2016   CREATININE 0.87 08/03/2016   BUN 17 08/03/2016   CO2 23 08/03/2016   TSH 5.38 (H) 06/03/2016   INR 0.96 05/28/2012   HGBA1C 6.2 (H) 08/03/2016   MICROALBUR <0.7 06/03/2016    No results found.   Assessment & Plan:  Plan  I am having Ms. Dubberly start on furosemide. I am also having her maintain her cholecalciferol, polyethylene glycol, docusate sodium, gabapentin, aspirin, HYDROcodone-acetaminophen, cyclobenzaprine, alendronate, Fluticasone-Salmeterol, albuterol, metFORMIN, ONETOUCH DELICA LANCETS FINE, glucose blood, omeprazole, Ca  Phosphate-Cholecalciferol, MYRBETRIQ, Multiple Vitamins-Minerals (OCUVITE EXTRA PO), and multivitamin with minerals.  Meds ordered this encounter  Medications  . furosemide (LASIX) 20 MG tablet    Sig: Take 1 tablet (20 mg total) by mouth daily.    Dispense:  30 tablet    Refill:  3    Problem List Items Addressed This Visit      Unprioritized   HYPERLIPIDEMIA   Relevant Medications   furosemide (LASIX) 20 MG tablet   Other Relevant Orders   Lipid panel   Comprehensive metabolic panel   Lipid panel   Diabetes mellitus with peripheral vascular disease ([place]) - Primary   Relevant Medications   furosemide (LASIX) 20 MG tablet   Other Relevant Orders   Comprehensive metabolic panel    Other Visit Diagnoses    Generalized edema       Relevant Medications   furosemide (LASIX) 20 MG tablet    elevate legs Inc po fluids   Follow-up: Return in about 6 months (around 03/21/2017).  [name], DO

## 2016-09-19 NOTE — Patient Instructions (Signed)
Carbohydrate Counting for Diabetes Mellitus, Adult Carbohydrate counting is a method for keeping track of how many carbohydrates you eat. Eating carbohydrates naturally increases the amount of sugar (glucose) in the blood. Counting how many carbohydrates you eat helps keep your blood glucose within normal limits, which helps you manage your diabetes (diabetes mellitus). It is important to know how many carbohydrates you can safely have in each meal. This is different for every person. A diet and nutrition specialist (registered dietitian) can help you make a meal plan and calculate how many carbohydrates you should have at each meal and snack. Carbohydrates are found in the following foods:  Grains, such as breads and cereals.  Dried beans and soy products.  Starchy vegetables, such as potatoes, peas, and corn.  Fruit and fruit juices.  Milk and yogurt.  Sweets and snack foods, such as cake, cookies, candy, chips, and soft drinks. How do I count carbohydrates? There are two ways to count carbohydrates in food. You can use either of the methods or a combination of both. Reading "Nutrition Facts" on packaged food  The "Nutrition Facts" list is included on the labels of almost all packaged foods and beverages in the [place] It includes:  The serving size.  Information about nutrients in each serving, including the grams (g) of carbohydrate per serving. To use the "Nutrition Facts":  Decide how many servings you will have.  Multiply the number of servings by the number of carbohydrates per serving.  The resulting number is the total amount of carbohydrates that you will be having. Learning standard serving sizes of other foods  When you eat foods containing carbohydrates that are not packaged or do not include "Nutrition Facts" on the label, you need to measure the servings in order to count the amount of carbohydrates:  Measure the foods that you will eat with a food scale or measuring  cup, if needed.  Decide how many standard-size servings you will eat.  Multiply the number of servings by 15. Most carbohydrate-rich foods have about 15 g of carbohydrates per serving.  For example, if you eat 8 oz (170 g) of strawberries, you will have eaten 2 servings and 30 g of carbohydrates (2 servings x 15 g = 30 g).  For foods that have more than one food mixed, such as soups and casseroles, you must count the carbohydrates in each food that is included. The following list contains standard serving sizes of common carbohydrate-rich foods. Each of these servings has about 15 g of carbohydrates:   hamburger bun or  English muffin.   oz (15 mL) syrup.   oz (14 g) jelly.  1 slice of bread.  1 six-inch tortilla.  3 oz (85 g) cooked rice or pasta.  4 oz (113 g) cooked dried beans.  4 oz (113 g) starchy vegetable, such as peas, corn, or potatoes.  4 oz (113 g) hot cereal.  4 oz (113 g) mashed potatoes or  of a large baked potato.  4 oz (113 g) canned or frozen fruit.  4 oz (120 mL) fruit juice.  4-6 crackers.  6 chicken nuggets.  6 oz (170 g) unsweetened dry cereal.  6 oz (170 g) plain fat-free yogurt or yogurt sweetened with artificial sweeteners.  8 oz (240 mL) milk.  8 oz (170 g) fresh fruit or one small piece of fruit.  24 oz (680 g) popped popcorn. Example of carbohydrate counting Sample meal  3 oz (85 g) chicken breast.  6 oz (  170 g) brown rice.  4 oz (113 g) corn.  8 oz (240 mL) milk.  8 oz (170 g) strawberries with sugar-free whipped topping. Carbohydrate calculation 1. Identify the foods that contain carbohydrates:  Rice.  Corn.  Milk.  Strawberries. 2. Calculate how many servings you have of each food:  2 servings rice.  1 serving corn.  1 serving milk.  1 serving strawberries. 3. Multiply each number of servings by 15 g:  2 servings rice x 15 g = 30 g.  1 serving corn x 15 g = 15 g.  1 serving milk x 15 g = 15  g.  1 serving strawberries x 15 g = 15 g. 4. Add together all of the amounts to find the total grams of carbohydrates eaten:  30 g + 15 g + 15 g + 15 g = 75 g of carbohydrates total. This information is not intended to replace advice given to you by your health care provider. Make sure you discuss any questions you have with your health care provider. Document Released: 05/16/2005 Document Revised: 12/04/2015 Document Reviewed: 10/28/2015 Elsevier Interactive Patient Education  2017 Elsevier Inc.  

## 2016-09-20 DIAGNOSIS — Z86711 Personal history of pulmonary embolism: Secondary | ICD-10-CM | POA: Diagnosis not present

## 2016-09-20 DIAGNOSIS — Z7984 Long term (current) use of oral hypoglycemic drugs: Secondary | ICD-10-CM | POA: Diagnosis not present

## 2016-09-20 DIAGNOSIS — E119 Type 2 diabetes mellitus without complications: Secondary | ICD-10-CM | POA: Diagnosis not present

## 2016-09-20 DIAGNOSIS — Z86718 Personal history of other venous thrombosis and embolism: Secondary | ICD-10-CM | POA: Diagnosis not present

## 2016-09-20 DIAGNOSIS — Z7982 Long term (current) use of aspirin: Secondary | ICD-10-CM | POA: Diagnosis not present

## 2016-09-20 DIAGNOSIS — J45909 Unspecified asthma, uncomplicated: Secondary | ICD-10-CM | POA: Diagnosis not present

## 2016-09-20 DIAGNOSIS — I10 Essential (primary) hypertension: Secondary | ICD-10-CM | POA: Diagnosis not present

## 2016-09-20 DIAGNOSIS — J984 Other disorders of lung: Secondary | ICD-10-CM | POA: Diagnosis not present

## 2016-09-20 DIAGNOSIS — Z4782 Encounter for orthopedic aftercare following scoliosis surgery: Secondary | ICD-10-CM | POA: Diagnosis not present

## 2016-09-20 DIAGNOSIS — Z981 Arthrodesis status: Secondary | ICD-10-CM | POA: Diagnosis not present

## 2016-09-23 DIAGNOSIS — Z4782 Encounter for orthopedic aftercare following scoliosis surgery: Secondary | ICD-10-CM | POA: Diagnosis not present

## 2016-09-23 DIAGNOSIS — Z86711 Personal history of pulmonary embolism: Secondary | ICD-10-CM | POA: Diagnosis not present

## 2016-09-23 DIAGNOSIS — Z86718 Personal history of other venous thrombosis and embolism: Secondary | ICD-10-CM | POA: Diagnosis not present

## 2016-09-23 DIAGNOSIS — Z7982 Long term (current) use of aspirin: Secondary | ICD-10-CM | POA: Diagnosis not present

## 2016-09-23 DIAGNOSIS — J45909 Unspecified asthma, uncomplicated: Secondary | ICD-10-CM | POA: Diagnosis not present

## 2016-09-23 DIAGNOSIS — Z981 Arthrodesis status: Secondary | ICD-10-CM | POA: Diagnosis not present

## 2016-09-23 DIAGNOSIS — E119 Type 2 diabetes mellitus without complications: Secondary | ICD-10-CM | POA: Diagnosis not present

## 2016-09-23 DIAGNOSIS — Z7984 Long term (current) use of oral hypoglycemic drugs: Secondary | ICD-10-CM | POA: Diagnosis not present

## 2016-09-23 DIAGNOSIS — J984 Other disorders of lung: Secondary | ICD-10-CM | POA: Diagnosis not present

## 2016-09-23 DIAGNOSIS — I10 Essential (primary) hypertension: Secondary | ICD-10-CM | POA: Diagnosis not present

## 2016-09-26 DIAGNOSIS — Z7982 Long term (current) use of aspirin: Secondary | ICD-10-CM | POA: Diagnosis not present

## 2016-09-26 DIAGNOSIS — Z4782 Encounter for orthopedic aftercare following scoliosis surgery: Secondary | ICD-10-CM | POA: Diagnosis not present

## 2016-09-26 DIAGNOSIS — Z86718 Personal history of other venous thrombosis and embolism: Secondary | ICD-10-CM | POA: Diagnosis not present

## 2016-09-26 DIAGNOSIS — Z981 Arthrodesis status: Secondary | ICD-10-CM | POA: Diagnosis not present

## 2016-09-26 DIAGNOSIS — Z86711 Personal history of pulmonary embolism: Secondary | ICD-10-CM | POA: Diagnosis not present

## 2016-09-26 DIAGNOSIS — I10 Essential (primary) hypertension: Secondary | ICD-10-CM | POA: Diagnosis not present

## 2016-09-26 DIAGNOSIS — J984 Other disorders of lung: Secondary | ICD-10-CM | POA: Diagnosis not present

## 2016-09-26 DIAGNOSIS — E119 Type 2 diabetes mellitus without complications: Secondary | ICD-10-CM | POA: Diagnosis not present

## 2016-09-26 DIAGNOSIS — Z7984 Long term (current) use of oral hypoglycemic drugs: Secondary | ICD-10-CM | POA: Diagnosis not present

## 2016-09-26 DIAGNOSIS — J45909 Unspecified asthma, uncomplicated: Secondary | ICD-10-CM | POA: Diagnosis not present

## 2016-09-26 NOTE — Addendum Note (Signed)
Addended by: Caffie Pinto on: 09/26/2016 07:05 AM   Modules accepted: Orders

## 2016-09-29 DIAGNOSIS — Z7982 Long term (current) use of aspirin: Secondary | ICD-10-CM | POA: Diagnosis not present

## 2016-09-29 DIAGNOSIS — Z86711 Personal history of pulmonary embolism: Secondary | ICD-10-CM | POA: Diagnosis not present

## 2016-09-29 DIAGNOSIS — Z4782 Encounter for orthopedic aftercare following scoliosis surgery: Secondary | ICD-10-CM | POA: Diagnosis not present

## 2016-09-29 DIAGNOSIS — E119 Type 2 diabetes mellitus without complications: Secondary | ICD-10-CM | POA: Diagnosis not present

## 2016-09-29 DIAGNOSIS — Z981 Arthrodesis status: Secondary | ICD-10-CM | POA: Diagnosis not present

## 2016-09-29 DIAGNOSIS — J45909 Unspecified asthma, uncomplicated: Secondary | ICD-10-CM | POA: Diagnosis not present

## 2016-09-29 DIAGNOSIS — I10 Essential (primary) hypertension: Secondary | ICD-10-CM | POA: Diagnosis not present

## 2016-09-29 DIAGNOSIS — Z86718 Personal history of other venous thrombosis and embolism: Secondary | ICD-10-CM | POA: Diagnosis not present

## 2016-09-29 DIAGNOSIS — J984 Other disorders of lung: Secondary | ICD-10-CM | POA: Diagnosis not present

## 2016-09-29 DIAGNOSIS — Z7984 Long term (current) use of oral hypoglycemic drugs: Secondary | ICD-10-CM | POA: Diagnosis not present

## 2016-10-04 DIAGNOSIS — I10 Essential (primary) hypertension: Secondary | ICD-10-CM | POA: Diagnosis not present

## 2016-10-04 DIAGNOSIS — Z7984 Long term (current) use of oral hypoglycemic drugs: Secondary | ICD-10-CM | POA: Diagnosis not present

## 2016-10-04 DIAGNOSIS — Z7982 Long term (current) use of aspirin: Secondary | ICD-10-CM | POA: Diagnosis not present

## 2016-10-04 DIAGNOSIS — E119 Type 2 diabetes mellitus without complications: Secondary | ICD-10-CM | POA: Diagnosis not present

## 2016-10-04 DIAGNOSIS — Z86711 Personal history of pulmonary embolism: Secondary | ICD-10-CM | POA: Diagnosis not present

## 2016-10-04 DIAGNOSIS — J45909 Unspecified asthma, uncomplicated: Secondary | ICD-10-CM | POA: Diagnosis not present

## 2016-10-04 DIAGNOSIS — Z4782 Encounter for orthopedic aftercare following scoliosis surgery: Secondary | ICD-10-CM | POA: Diagnosis not present

## 2016-10-04 DIAGNOSIS — Z86718 Personal history of other venous thrombosis and embolism: Secondary | ICD-10-CM | POA: Diagnosis not present

## 2016-10-04 DIAGNOSIS — Z981 Arthrodesis status: Secondary | ICD-10-CM | POA: Diagnosis not present

## 2016-10-04 DIAGNOSIS — J984 Other disorders of lung: Secondary | ICD-10-CM | POA: Diagnosis not present

## 2016-10-07 DIAGNOSIS — Z7984 Long term (current) use of oral hypoglycemic drugs: Secondary | ICD-10-CM | POA: Diagnosis not present

## 2016-10-07 DIAGNOSIS — I10 Essential (primary) hypertension: Secondary | ICD-10-CM | POA: Diagnosis not present

## 2016-10-07 DIAGNOSIS — Z7982 Long term (current) use of aspirin: Secondary | ICD-10-CM | POA: Diagnosis not present

## 2016-10-07 DIAGNOSIS — Z86718 Personal history of other venous thrombosis and embolism: Secondary | ICD-10-CM | POA: Diagnosis not present

## 2016-10-07 DIAGNOSIS — J45909 Unspecified asthma, uncomplicated: Secondary | ICD-10-CM | POA: Diagnosis not present

## 2016-10-07 DIAGNOSIS — Z4782 Encounter for orthopedic aftercare following scoliosis surgery: Secondary | ICD-10-CM | POA: Diagnosis not present

## 2016-10-07 DIAGNOSIS — J984 Other disorders of lung: Secondary | ICD-10-CM | POA: Diagnosis not present

## 2016-10-07 DIAGNOSIS — E119 Type 2 diabetes mellitus without complications: Secondary | ICD-10-CM | POA: Diagnosis not present

## 2016-10-07 DIAGNOSIS — Z981 Arthrodesis status: Secondary | ICD-10-CM | POA: Diagnosis not present

## 2016-10-07 DIAGNOSIS — Z86711 Personal history of pulmonary embolism: Secondary | ICD-10-CM | POA: Diagnosis not present

## 2016-10-12 DIAGNOSIS — Z4782 Encounter for orthopedic aftercare following scoliosis surgery: Secondary | ICD-10-CM | POA: Diagnosis not present

## 2016-10-12 DIAGNOSIS — J45909 Unspecified asthma, uncomplicated: Secondary | ICD-10-CM | POA: Diagnosis not present

## 2016-10-12 DIAGNOSIS — J984 Other disorders of lung: Secondary | ICD-10-CM | POA: Diagnosis not present

## 2016-10-12 DIAGNOSIS — E119 Type 2 diabetes mellitus without complications: Secondary | ICD-10-CM | POA: Diagnosis not present

## 2016-10-12 DIAGNOSIS — Z7982 Long term (current) use of aspirin: Secondary | ICD-10-CM | POA: Diagnosis not present

## 2016-10-12 DIAGNOSIS — Z981 Arthrodesis status: Secondary | ICD-10-CM | POA: Diagnosis not present

## 2016-10-12 DIAGNOSIS — Z86718 Personal history of other venous thrombosis and embolism: Secondary | ICD-10-CM | POA: Diagnosis not present

## 2016-10-12 DIAGNOSIS — Z86711 Personal history of pulmonary embolism: Secondary | ICD-10-CM | POA: Diagnosis not present

## 2016-10-12 DIAGNOSIS — I10 Essential (primary) hypertension: Secondary | ICD-10-CM | POA: Diagnosis not present

## 2016-10-12 DIAGNOSIS — Z7984 Long term (current) use of oral hypoglycemic drugs: Secondary | ICD-10-CM | POA: Diagnosis not present

## 2016-10-14 DIAGNOSIS — J984 Other disorders of lung: Secondary | ICD-10-CM | POA: Diagnosis not present

## 2016-10-14 DIAGNOSIS — I10 Essential (primary) hypertension: Secondary | ICD-10-CM | POA: Diagnosis not present

## 2016-10-14 DIAGNOSIS — J45909 Unspecified asthma, uncomplicated: Secondary | ICD-10-CM | POA: Diagnosis not present

## 2016-10-14 DIAGNOSIS — Z4782 Encounter for orthopedic aftercare following scoliosis surgery: Secondary | ICD-10-CM | POA: Diagnosis not present

## 2016-10-14 DIAGNOSIS — Z981 Arthrodesis status: Secondary | ICD-10-CM | POA: Diagnosis not present

## 2016-10-14 DIAGNOSIS — Z86718 Personal history of other venous thrombosis and embolism: Secondary | ICD-10-CM | POA: Diagnosis not present

## 2016-10-14 DIAGNOSIS — Z86711 Personal history of pulmonary embolism: Secondary | ICD-10-CM | POA: Diagnosis not present

## 2016-10-14 DIAGNOSIS — Z7982 Long term (current) use of aspirin: Secondary | ICD-10-CM | POA: Diagnosis not present

## 2016-10-14 DIAGNOSIS — Z7984 Long term (current) use of oral hypoglycemic drugs: Secondary | ICD-10-CM | POA: Diagnosis not present

## 2016-10-14 DIAGNOSIS — E119 Type 2 diabetes mellitus without complications: Secondary | ICD-10-CM | POA: Diagnosis not present

## 2016-10-17 ENCOUNTER — Ambulatory Visit (HOSPITAL_BASED_OUTPATIENT_CLINIC_OR_DEPARTMENT_OTHER)
Admission: RE | Admit: 2016-10-17 | Discharge: 2016-10-17 | Disposition: A | Discharge status: Home / Self Care | Payer: Medicare Other | Source: Ambulatory Visit | Attending: Medical | Admitting: Medical

## 2016-10-17 ENCOUNTER — Other Ambulatory Visit: Payer: Self-pay | Admitting: Medical

## 2016-10-17 ENCOUNTER — Encounter: Payer: Self-pay | Admitting: Medical

## 2016-10-17 ENCOUNTER — Ambulatory Visit (INDEPENDENT_AMBULATORY_CARE_PROVIDER_SITE_OTHER): Payer: Medicare Other | Admitting: Medical

## 2016-10-17 VITALS — BP 121/69 | HR 77 | Temp 98.0°F | Resp 16 | Ht 63.0 in | Wt 140.8 lb

## 2016-10-17 DIAGNOSIS — L089 Local infection of the skin and subcutaneous tissue, unspecified: Secondary | ICD-10-CM | POA: Diagnosis not present

## 2016-10-17 DIAGNOSIS — M7989 Other specified soft tissue disorders: Secondary | ICD-10-CM | POA: Diagnosis not present

## 2016-10-17 DIAGNOSIS — I700 Atherosclerosis of aorta: Secondary | ICD-10-CM | POA: Diagnosis not present

## 2016-10-17 DIAGNOSIS — R600 Localized edema: Secondary | ICD-10-CM

## 2016-10-17 DIAGNOSIS — M79605 Pain in left leg: Secondary | ICD-10-CM

## 2016-10-17 MED ORDER — CEPHALEXIN 500 MG PO CAPS: 500.0000 mg | ORAL_CAPSULE | Freq: Two times a day (BID) | ORAL | 0 refills | Status: DC

## 2016-10-17 MED ORDER — HYDROCHLOROTHIAZIDE 12.5 MG PO CAPS: 12.5000 mg | ORAL_CAPSULE | Freq: Every day | ORAL | 0 refills | Status: DC

## 2016-10-17 NOTE — Patient Instructions (Addendum)
For pedal edema over past 2 month will get chestxray to evaluate other cause other than dependent edema.  Will get labs today to assess infection fighting cells and to look at heart failure protein study as well.  For the chronic pedal edema will rx hctz 12.5 mg. You have option to take every other day as it can drop your blood pressure. You might tolerate this better than your lasix which you stopped.  Will get left lower ext [place] to make sure do dvt present. If study negative will treat for possible infection. Rx keflex antibiotic.  Follow up in 4-7 days or as needed

## 2016-10-17 NOTE — Progress Notes (Signed)
Subjective:    Patient ID: [name], female    DOB: March 28, 1941, 76 y.o.   MRN: [id number]  HPI  Pt in with some swelling to both legs. Swelling since march after back surgery.  Pt weight has not increased.  Pt had been given lasix but she had to urinate excessively. So she only took  lasix one week. Did not want to  continue.  Pt redness to both legs but worse on left side. Left calf/pretibial area mild tender.   She had swelling of legs for about 2 months. But left side littlw more recently and has warmth and new tenderness.  Pt back pain is stable.     Review of Systems  Constitutional: Negative for chills, fatigue and fever.  HENT: Negative for congestion, ear pain and facial swelling.   Respiratory: Negative for cough, chest tightness and shortness of breath.   Cardiovascular: Negative for palpitations.  Gastrointestinal: Negative for abdominal pain.  Musculoskeletal: Positive for back pain. Negative for myalgias.       Leg pain and swelling. See hpi.  Skin: Positive for rash.  Neurological: Negative for dizziness, weakness and headaches.  Hematological: Negative for adenopathy. Does not bruise/bleed easily.  Psychiatric/Behavioral: Negative for behavioral problems and confusion.   Past Medical History:  Diagnosis Date  . Anemia   . Arthritis   . Asthma    adult onset  . Basal cell cancer    LUE; Porokeratosis also  . Chronic kidney disease 1963   strep in kidney due to strep throat-hospitalized 10 days  . Complication of anesthesia    small trachea  . Diabetes mellitus 2010   A1c 6.7%  . DVT (deep venous thrombosis) (HCC) 2006   post immobilization post cns surgery  . GERD (gastroesophageal reflux disease)    very mild  . Granulomatous lung disease (HCC) 2002   incidental Xray finding  . Hyperlipidemia   . Hypertension 2004   Hypertensive response on Stress Test  . Paralysis (HCC) 2006   post cervical fusion with spinal sac tear  with hematoma   . PTE  (pulmonary thromboembolism) (HCC) 2006     Social History   Social History  . Marital status: Divorced    Spouse name: N/A  . Number of children: N/A  . Years of education: N/A   Occupational History  . Not on file.   Social History Main Topics  . Smoking status: Never Smoker  . Smokeless tobacco: Never Used  . Alcohol use 0.0 oz/week     Comment: twice a week; 2-4/week  . Drug use: No  . Sexual activity: Not Currently   Other Topics Concern  . Not on file   Social History Narrative  . No narrative on file    Past Surgical History:  Procedure Laterality Date  . ABDOMINAL EXPOSURE N/A 08/12/2016   Procedure: ABDOMINAL EXPOSURE;  Surgeon: Larina Earthly, MD;  Location: [facility] OR;  Service: Vascular;  Laterality: N/A;  . ANTERIOR LAT LUMBAR FUSION N/A 08/12/2016   Procedure: LUMBAR TWO-THREE, LUMBAR THREE-FOUR, LUMBAR FOUR-FIVE  ANTEROLATERAL LUMBAR INTERBODY FUSION;  Surgeon: Maeola Harman, MD;  Location: [facility] OR;  Service: Neurosurgery;  Laterality: N/A;  L2-3 L3-4 L4-5 Anterolateral lumbar interbody fusion  . ANTERIOR LUMBAR FUSION N/A 08/12/2016   Procedure: Lumbar Five-Sacral One Anterior lumbar interbody fusion with Dr. Tawanna Cooler Early to assist;  Surgeon: Maeola Harman, MD;  Location: [facility] OR;  Service: Neurosurgery;  Laterality: N/A;  L5-S1 Anterior lumbar interbody fusion with  Dr. Tawanna Cooler Early to assist  . BUNIONECTOMY    . CERVICAL FUSION  2006   Dr Jordan Likes, NS;post op hematoma & cns leak & urinary retention  . COLONOSCOPY  1992 & 2002   negative  . epidural steroids  2006   cervical spine  . LUMBAR PERCUTANEOUS PEDICLE SCREW 4 LEVEL N/A 08/12/2016   Procedure: LUMBAR TWO-SACRAL ONE Percuataneous Pedicle Screws;  Surgeon: Maeola Harman, MD;  Location: [facility] OR;  Service: Neurosurgery;  Laterality: N/A;  . ROTATOR CUFF REPAIR  2009   Right  . SEPTOPLASTY    . TONSILLECTOMY  76 years old  . TOTAL HIP ARTHROPLASTY  06/01/2012   Procedure: TOTAL HIP ARTHROPLASTY ANTERIOR APPROACH;  Surgeon:  Kathryne Hitch, MD;  Location: [facility] ORS;  Service: Orthopedics;  Laterality: Left;  Left Total Hip Arthroplasty  . TUBAL LIGATION      Family History  Problem Relation Age of Onset  . COPD Father        emphysema  . Cancer Mother        cns cancer  . Diabetes Sister        TWIN sister ; also Fibromyalgia ; S/P stent 2004  . Heart disease Sister        stents @ 60 & 68  . Stroke Maternal Grandmother        in  late 48s  . Transient ischemic attack Paternal Aunt     Allergies  Allergen Reactions  . Statins Other (See Comments)    Myalgias and muscle weakness    Current Outpatient Prescriptions on File Prior to Visit  Medication Sig Dispense Refill  . albuterol (PROAIR HFA) 108 (90 Base) MCG/ACT inhaler Inhale 2 puffs into the lungs every 6 (six) hours as needed for wheezing or shortness of breath. 3 Inhaler 3  . alendronate (FOSAMAX) 70 MG tablet Take 1 tablet (70 mg total) by mouth every 7 (seven) days. Take with a full glass of water on an empty stomach. 4 tablet 11  . aspirin 81 MG tablet Take 81 mg by mouth daily.    . Ca Phosphate-Cholecalciferol (CALCIUM 500 + D3) 250-500 MG-UNIT CHEW Chew by mouth.    . cholecalciferol (VITAMIN D) 1000 UNITS tablet Take 1,000 Units by mouth daily.     . cyclobenzaprine (FLEXERIL) 10 MG tablet TAKE 1 TABLET BY MOUTH AS NEEDED FOR HIP CONCERNS 30 tablet 0  . docusate sodium (COLACE) 100 MG capsule Take 100-200 mg by mouth every morning.    . Fluticasone-Salmeterol (ADVAIR DISKUS) 250-50 MCG/DOSE AEPB Use 1 inhalation two times  daily ;rinse mouth 180 each 3  . gabapentin (NEURONTIN) 300 MG capsule Take 300 mg by mouth 3 (three) times daily.   1  . glucose blood (ONETOUCH VERIO) test strip Check blood sugar twice daily 200 each 3  . HYDROcodone-acetaminophen (NORCO/VICODIN) 5-325 MG tablet Take 1 tablet by mouth every 6 (six) hours as needed for moderate pain.   0  . metFORMIN (GLUCOPHAGE-XR) 500 MG 24 hr tablet Take 1 tablet (500 mg  total) by mouth daily with breakfast. 90 tablet 6  . Multiple Vitamin (MULTIVITAMIN WITH MINERALS) TABS tablet Take 1 tablet by mouth daily.    . Multiple Vitamins-Minerals (OCUVITE EXTRA PO) Take 1 tablet by mouth daily.    [name] MYRBETRIQ 50 MG TB24 tablet TAKE 1 TABLET(50 MG) BY MOUTH DAILY 30 tablet 5  . omeprazole (PRILOSEC) 20 MG capsule Take 20 mg by mouth daily as needed.    [name]  ONETOUCH DELICA LANCETS FINE MISC Check blood sugar twice daily 200 each 3  . polyethylene glycol (MIRALAX / GLYCOLAX) packet Take 17 g by mouth daily.     No current facility-administered medications on file prior to visit.     BP 121/69 (BP Location: Right Arm, Patient Position: Sitting, Cuff Size: Normal)   Pulse 77   Temp 98 F (36.7 C) (Oral)   Resp 16   Ht 5\' 3"  (1.6 m)   Wt 140 lb 12.8 oz (63.9 kg)   SpO2 99%   BMI 24.94 kg/m       Objective:   Physical Exam  General Mental Status- Alert. General Appearance- Not in acute distress.   Skin General: Color- Normal Color. Moisture- Normal Moisture.  Neck Carotid Arteries- Normal color. Moisture- Normal Moisture. No carotid bruits. No JVD.  Chest and Lung Exam Auscultation: Breath Sounds:-Normal.  Cardiovascular Auscultation:Rythm- Regular. Murmurs & Other Heart Sounds:Auscultation of the heart reveals- No Murmurs.  Abdomen Inspection:-Inspeection Normal. Palpation/Percussion:Note:No mass. Palpation and Percussion of the abdomen reveal- Non Tender, Non Distended + BS, no rebound or guarding.    Neurologic Cranial Nerve exam:- CN III-XII intact(No nystagmus), symmetric smile. Strength:- 5/5 equal and symmetric strength both upper and lower extremities.  Lt lower ext- moderate swelling pretibial area with mild  warmth, redness and tenderness  Rt lower ext- faint pink.red appearance. Fait warmth and not as swollen as left side.  Both legs negative homans signs.      Assessment & Plan:  For pedal edema over past 2 month will get   Chest xray to evaluate other cause other than dependent edema.  Will get labs today to assess infection fighting cells and to look at heart failure protein study as well.  For the chronic pedal edema will rx hctz 12.5 mg. You have option to take every other day as it can drop your blood pressure. You might tolerate this better than your lasix which you stopped.  Will get left lower ext [place] to make sure do dvt present. If study negative will treat for possible infection. Rx keflex antibiotic.  Follow up in 4-7 days or as needed.  Ervey Fallin, Ramon Dredge, PA-C

## 2016-10-18 ENCOUNTER — Telehealth: Payer: Self-pay | Admitting: Medical

## 2016-10-18 LAB — CBC WITH DIFFERENTIAL/PLATELET
BASOS ABS: 0.1 10*3/uL (ref 0.0–0.1)
Basophils Relative: 1.1 % (ref 0.0–3.0)
Eosinophils Absolute: 0.1 10*3/uL (ref 0.0–0.7)
Eosinophils Relative: 1.5 % (ref 0.0–5.0)
HEMATOCRIT: 32 % — AB (ref 36.0–46.0)
HEMOGLOBIN: 10.9 g/dL — AB (ref 12.0–15.0)
Lymphocytes Relative: 26.2 % (ref 12.0–46.0)
Lymphs Abs: 1.9 10*3/uL (ref 0.7–4.0)
MCHC: 34.1 g/dL (ref 30.0–36.0)
MCV: 89.5 fl (ref 78.0–100.0)
MONOS PCT: 10.2 % (ref 3.0–12.0)
Monocytes Absolute: 0.8 10*3/uL (ref 0.1–1.0)
NEUTROS ABS: 4.5 10*3/uL (ref 1.4–7.7)
Neutrophils Relative %: 61 % (ref 43.0–77.0)
PLATELETS: 305 10*3/uL (ref 150.0–400.0)
RBC: 3.58 Mil/uL — AB (ref 3.87–5.11)
RDW: 15.8 % — ABNORMAL HIGH (ref 11.5–15.5)
WBC: 7.4 10*3/uL (ref 4.0–10.5)

## 2016-10-18 LAB — BRAIN NATRIURETIC PEPTIDE: Pro B Natriuretic peptide (BNP): 41 pg/mL (ref 0.0–100.0)

## 2016-10-18 NOTE — Telephone Encounter (Signed)
Pt called again stating that she was told by drug store they need authorization to dispense hydrochlorothiazide. Please call [id number] - [place], [place] - [address] AT [address] [place].   Please call pt back when done Ph# [phone]

## 2016-10-18 NOTE — Telephone Encounter (Signed)
Notified pt of results. Spoke wit pharmacy rx is being filled. Notified pt.

## 2016-10-18 NOTE — Telephone Encounter (Signed)
Caller name: Kc Relation to pt: self Call back number: [phone] Pharmacy:  Reason for call: Pt was calling wanting to know about lab results, pt ask directly for [facility]. Please advise.

## 2016-10-19 DIAGNOSIS — R030 Elevated blood-pressure reading, without diagnosis of hypertension: Secondary | ICD-10-CM | POA: Diagnosis not present

## 2016-10-19 DIAGNOSIS — M4120 Other idiopathic scoliosis, site unspecified: Secondary | ICD-10-CM | POA: Diagnosis not present

## 2016-10-20 NOTE — Telephone Encounter (Signed)
Patient calling back with questions about results [phone]

## 2016-10-20 NOTE — Telephone Encounter (Signed)
AWV scheduled with Angel @10  on 12/05/16

## 2016-10-21 ENCOUNTER — Ambulatory Visit: Payer: Medicare Other | Admitting: Family Medicine

## 2016-10-21 NOTE — Telephone Encounter (Signed)
Called and spoke with the pt and she requested [place] results.  Informed the pt of the results and note.   She verbalized understanding.  Pt stated that she wanted to know what kind infection is was on her leg, and how she can keep from getting it again.  Informed the pt that he did not mention what the infection was on the results.  Pt stated that it was okay she has an appt with Percell [name] next week and she will ask him then.//AB/CMA

## 2016-10-21 NOTE — Telephone Encounter (Signed)
Patient inquiring about lab results, please advise

## 2016-10-25 ENCOUNTER — Encounter: Payer: Self-pay | Admitting: Medical

## 2016-10-25 ENCOUNTER — Ambulatory Visit (INDEPENDENT_AMBULATORY_CARE_PROVIDER_SITE_OTHER): Payer: Medicare Other | Admitting: Medical

## 2016-10-25 ENCOUNTER — Ambulatory Visit (HOSPITAL_BASED_OUTPATIENT_CLINIC_OR_DEPARTMENT_OTHER)
Admission: RE | Admit: 2016-10-25 | Discharge: 2016-10-25 | Disposition: A | Discharge status: Home / Self Care | Payer: Medicare Other | Source: Ambulatory Visit | Attending: Medical | Admitting: Medical

## 2016-10-25 VITALS — BP 122/64 | HR 82 | Temp 98.1°F | Resp 16 | Ht 63.0 in | Wt 138.6 lb

## 2016-10-25 DIAGNOSIS — M7989 Other specified soft tissue disorders: Secondary | ICD-10-CM | POA: Diagnosis not present

## 2016-10-25 DIAGNOSIS — M79605 Pain in left leg: Secondary | ICD-10-CM

## 2016-10-25 DIAGNOSIS — I10 Essential (primary) hypertension: Secondary | ICD-10-CM

## 2016-10-25 DIAGNOSIS — L089 Local infection of the skin and subcutaneous tissue, unspecified: Secondary | ICD-10-CM

## 2016-10-25 LAB — COMPREHENSIVE METABOLIC PANEL
ALT: 16 U/L (ref 0–35)
AST: 18 U/L (ref 0–37)
Albumin: 4.1 g/dL (ref 3.5–5.2)
Alkaline Phosphatase: 69 U/L (ref 39–117)
BUN: 14 mg/dL (ref 6–23)
CHLORIDE: 101 meq/L (ref 96–112)
CO2: 27 mEq/L (ref 19–32)
Calcium: 9.8 mg/dL (ref 8.4–10.5)
Creatinine, Ser: 0.78 mg/dL (ref 0.40–1.20)
GFR: 76.37 mL/min (ref >60.00)
Glucose, Bld: 119 mg/dL — ABNORMAL HIGH (ref 70–99)
Potassium: 4 mEq/L (ref 3.5–5.1)
Sodium: 136 mEq/L (ref 135–145)
Total Bilirubin: 0.3 mg/dL (ref 0.2–1.2)
Total Protein: 7.5 g/dL (ref 6.0–8.3)

## 2016-10-25 MED ORDER — HYDROCHLOROTHIAZIDE 12.5 MG PO CAPS: 12.5000 mg | ORAL_CAPSULE | Freq: Every day | ORAL | 0 refills | Status: DC

## 2016-10-25 MED ORDER — DOXYCYCLINE HYCLATE 100 MG PO TABS: 100.0000 mg | ORAL_TABLET | Freq: Two times a day (BID) | ORAL | 0 refills | Status: DC

## 2016-10-25 NOTE — Patient Instructions (Addendum)
Your pedal edema looks much better. Would continue taking the hctz daily for 2 weeks and keep elevating legs daily. If swelling in legs persist in future despite elevating legs could start the hctz at 1 tab every other day at end of 2 weeks.. Remember to eat potassium rich diet. Get cmp today.  Your left leg looks much better after keflex. Some warmth still remains. Will get xray of your tibia today and rx 5 days of doxycycline(rx advisement given on how to avoid side effects). Use probiotic as well.  Follow up in 7-10 days or as needed.(if leg feels completely improved(100%) then follow up would not be necessary)

## 2016-10-25 NOTE — Progress Notes (Signed)
Subjective:    Patient ID: [name], female    DOB: [birth year], 76 y.o.   MRN: [id number]  HPI  Pt in for follow up.  She is taking hctz daily. It is not causing her to urinate her too much.   Pt is keeping her legs elevated.   Swelling has gone done from both legs. The pretibial area on left leg is still sore but not as bad as before(improved a lot per pt and daughter). Pt cxr was negative and [place] on left sidel ower ext was negative.  Pt had gotten opinion from her neurosurgeon about leg swelling and he advised elevating legs which I advised as well.    Review of Systems  Constitutional: Negative for chills, fatigue and fever.  Respiratory: Negative for cough, chest tightness, shortness of breath and wheezing.   Cardiovascular: Negative for chest pain.  Gastrointestinal: Negative for abdominal pain, constipation, diarrhea and rectal pain.  Musculoskeletal: Negative for back pain, myalgias and neck stiffness.       See hpi left lower ext.  Skin: Negative for rash.       See hpi left lower ex.t  Neurological: Negative for dizziness, numbness and headaches.  Hematological: Negative for adenopathy. Does not bruise/bleed easily.  Psychiatric/Behavioral: Negative for confusion and decreased concentration.   Past Medical History:  Diagnosis Date  . Anemia   . Arthritis   . Asthma    adult onset  . Basal cell cancer    LUE; Porokeratosis also  . Chronic kidney disease 1963   strep in kidney due to strep throat-hospitalized 10 days  . Complication of anesthesia    small trachea  . Diabetes mellitus 2010   A1c 6.7%  . DVT (deep venous thrombosis) (HCC) 2006   post immobilization post cns surgery  . GERD (gastroesophageal reflux disease)    very mild  . Granulomatous lung disease (HCC) 2002   incidental Xray finding  . Hyperlipidemia   . Hypertension 2004   Hypertensive response on Stress Test  . Paralysis (HCC) 2006   post cervical fusion with spinal sac tear  with  hematoma   . PTE (pulmonary thromboembolism) (HCC) 2006     Social History   Social History  . Marital status: Divorced    Spouse name: N/A  . Number of children: N/A  . Years of education: N/A   Occupational History  . Not on file.   Social History Main Topics  . Smoking status: Never Smoker  . Smokeless tobacco: Never Used  . Alcohol use 0.0 oz/week     Comment: twice a week; 2-4/week  . Drug use: No  . Sexual activity: Not Currently   Other Topics Concern  . Not on file   Social History Narrative  . No narrative on file    Past Surgical History:  Procedure Laterality Date  . ABDOMINAL EXPOSURE N/A 08/12/2016   Procedure: ABDOMINAL EXPOSURE;  Surgeon: Larina Earthly, MD;  Location: [facility] OR;  Service: Vascular;  Laterality: N/A;  . ANTERIOR LAT LUMBAR FUSION N/A 08/12/2016   Procedure: LUMBAR TWO-THREE, LUMBAR THREE-FOUR, LUMBAR FOUR-FIVE  ANTEROLATERAL LUMBAR INTERBODY FUSION;  Surgeon: Maeola Harman, MD;  Location: [facility] OR;  Service: Neurosurgery;  Laterality: N/A;  L2-3 L3-4 L4-5 Anterolateral lumbar interbody fusion  . ANTERIOR LUMBAR FUSION N/A 08/12/2016   Procedure: Lumbar Five-Sacral One Anterior lumbar interbody fusion with Dr. Tawanna Cooler Early to assist;  Surgeon: Maeola Harman, MD;  Location: [facility] OR;  Service: Neurosurgery;  Laterality:  N/A;  L5-S1 Anterior lumbar interbody fusion with Dr. Tawanna Cooler Early to assist  . BUNIONECTOMY    . CERVICAL FUSION  2006   Dr Jordan Likes, NS;post op hematoma & cns leak & urinary retention  . COLONOSCOPY  1992 & 2002   negative  . epidural steroids  2006   cervical spine  . LUMBAR PERCUTANEOUS PEDICLE SCREW 4 LEVEL N/A 08/12/2016   Procedure: LUMBAR TWO-SACRAL ONE Percuataneous Pedicle Screws;  Surgeon: Maeola Harman, MD;  Location: [facility] OR;  Service: Neurosurgery;  Laterality: N/A;  . ROTATOR CUFF REPAIR  2009   Right  . SEPTOPLASTY    . TONSILLECTOMY  76 years old  . TOTAL HIP ARTHROPLASTY  06/01/2012   Procedure: TOTAL HIP ARTHROPLASTY ANTERIOR  APPROACH;  Surgeon: Kathryne Hitch, MD;  Location: [facility] ORS;  Service: Orthopedics;  Laterality: Left;  Left Total Hip Arthroplasty  . TUBAL LIGATION      Family History  Problem Relation Age of Onset  . COPD Father        emphysema  . Cancer Mother        cns cancer  . Diabetes Sister        TWIN sister ; also Fibromyalgia ; S/P stent 2004  . Heart disease Sister        stents @ 60 & 68  . Stroke Maternal Grandmother        in  late 57s  . Transient ischemic attack Paternal Aunt     Allergies  Allergen Reactions  . Statins Other (See Comments)    Myalgias and muscle weakness    Current Outpatient Prescriptions on File Prior to Visit  Medication Sig Dispense Refill  . albuterol (PROAIR HFA) 108 (90 Base) MCG/ACT inhaler Inhale 2 puffs into the lungs every 6 (six) hours as needed for wheezing or shortness of breath. 3 Inhaler 3  . alendronate (FOSAMAX) 70 MG tablet Take 1 tablet (70 mg total) by mouth every 7 (seven) days. Take with a full glass of water on an empty stomach. 4 tablet 11  . aspirin 81 MG tablet Take 81 mg by mouth daily.    . Ca Phosphate-Cholecalciferol (CALCIUM 500 + D3) 250-500 MG-UNIT CHEW Chew by mouth.    . cephALEXin (KEFLEX) 500 MG capsule Take 1 capsule (500 mg total) by mouth 2 (two) times daily. 14 capsule 0  . cholecalciferol (VITAMIN D) 1000 UNITS tablet Take 1,000 Units by mouth daily.     . cyclobenzaprine (FLEXERIL) 10 MG tablet TAKE 1 TABLET BY MOUTH AS NEEDED FOR HIP CONCERNS 30 tablet 0  . docusate sodium (COLACE) 100 MG capsule Take 100-200 mg by mouth every morning.    . Fluticasone-Salmeterol (ADVAIR DISKUS) 250-50 MCG/DOSE AEPB Use 1 inhalation two times  daily ;rinse mouth 180 each 3  . gabapentin (NEURONTIN) 300 MG capsule Take 300 mg by mouth 3 (three) times daily.   1  . glucose blood (ONETOUCH VERIO) test strip Check blood sugar twice daily 200 each 3  . hydrochlorothiazide (MICROZIDE) 12.5 MG capsule Take 1 capsule (12.5 mg  total) by mouth daily. 30 capsule 0  . HYDROcodone-acetaminophen (NORCO/VICODIN) 5-325 MG tablet Take 1 tablet by mouth every 6 (six) hours as needed for moderate pain.   0  . metFORMIN (GLUCOPHAGE-XR) 500 MG 24 hr tablet Take 1 tablet (500 mg total) by mouth daily with breakfast. 90 tablet 6  . methocarbamol (ROBAXIN) 500 MG tablet Take 1 tablet by mouth 3 (three) times daily.  0  .  Multiple Vitamin (MULTIVITAMIN WITH MINERALS) TABS tablet Take 1 tablet by mouth daily.    . Multiple Vitamins-Minerals (OCUVITE EXTRA PO) Take 1 tablet by mouth daily.    [name] MYRBETRIQ 50 MG TB24 tablet TAKE 1 TABLET(50 MG) BY MOUTH DAILY 30 tablet 5  . omeprazole (PRILOSEC) 20 MG capsule Take 20 mg by mouth daily as needed.    Letta Pate DELICA LANCETS FINE MISC Check blood sugar twice daily 200 each 3  . polyethylene glycol (MIRALAX / GLYCOLAX) packet Take 17 g by mouth daily.     No current facility-administered medications on file prior to visit.     BP 122/64 (BP Location: Left Arm, Patient Position: Sitting, Cuff Size: Normal)   Pulse 82   Temp 98.1 F (36.7 C) (Oral)   Resp 16   Ht 5\' 3"  (1.6 m)   Wt 138 lb 9.6 oz (62.9 kg)   SpO2 94%   BMI 24.55 kg/m       Objective:   Physical Exam  General Mental Status- Alert. General Appearance- Not in acute distress.   Skin General: Color- Normal Color. Moisture- Normal Moisture.  Neck Carotid Arteries- Normal color. Moisture- Normal Moisture. No carotid bruits. No JVD.  Chest and Lung Exam Auscultation: Breath Sounds:-Normal.  Cardiovascular Auscultation:Rythm- Regular. Murmurs & Other Heart Sounds:Auscultation of the heart reveals- No Murmurs.  Abdomen Inspection:-Inspeection Normal. Palpation/Percussion:Note:No mass. Palpation and Percussion of the abdomen reveal- Non Tender, Non Distended + BS, no rebound or guarding.   Neurologic Cranial Nerve exam:- CN III-XII intact(No nystagmus), symmetric smile. Strength:- 5/5 equal and symmetric  strength both upper and lower extremities.  Lower ext- much less pedal edema. Left side faint warm(less than before) and faint mild pretibial pain..      Assessment & Plan:  our pedal edema looks much better. Would continue taking the hctz daily for 2 weeks and keep elevating legs daily. If swelling in legs persist in future despite elevating legs could start the hctz at 1 tab every other day at end of 2 weeks. Remember to eat potassium rich diet. Get cmp today.  Your left leg looks much better after keflex. Some warmth still remains. Will get xray of your tibia today and rx 5 days of doxycycline(rx advisement given on how to avoid side effects). Use probiotic as well.  Follow up in 7-10 days or as needed.(if leg feels completely improved(100%) then follow up would not be necessary)  Nature Kueker, Ramon Dredge, PA-C

## 2016-11-04 DIAGNOSIS — R600 Localized edema: Secondary | ICD-10-CM | POA: Diagnosis not present

## 2016-11-04 DIAGNOSIS — L03115 Cellulitis of right lower limb: Secondary | ICD-10-CM | POA: Diagnosis not present

## 2016-11-04 DIAGNOSIS — L03116 Cellulitis of left lower limb: Secondary | ICD-10-CM | POA: Diagnosis not present

## 2016-11-07 ENCOUNTER — Ambulatory Visit: Payer: Medicare Other | Admitting: Family Medicine

## 2016-11-07 ENCOUNTER — Telehealth: Payer: Self-pay | Admitting: Family Medicine

## 2016-11-07 NOTE — Telephone Encounter (Signed)
Patient left message on VM 6/11 @ 3:27 [name] she needed to cancel appointment today. Charge or No Charge?

## 2016-11-14 ENCOUNTER — Ambulatory Visit (INDEPENDENT_AMBULATORY_CARE_PROVIDER_SITE_OTHER): Payer: Medicare Other | Admitting: Orthopedic Surgery

## 2016-11-14 ENCOUNTER — Encounter (INDEPENDENT_AMBULATORY_CARE_PROVIDER_SITE_OTHER): Payer: Self-pay | Admitting: Orthopedic Surgery

## 2016-11-14 DIAGNOSIS — M25562 Pain in left knee: Secondary | ICD-10-CM | POA: Diagnosis not present

## 2016-11-14 DIAGNOSIS — G8929 Other chronic pain: Secondary | ICD-10-CM | POA: Diagnosis not present

## 2016-11-14 DIAGNOSIS — M25511 Pain in right shoulder: Secondary | ICD-10-CM | POA: Diagnosis not present

## 2016-11-14 MED ORDER — NABUMETONE 500 MG PO TABS: ORAL_TABLET | ORAL | 0 refills | Status: DC

## 2016-11-16 DIAGNOSIS — M48062 Spinal stenosis, lumbar region with neurogenic claudication: Secondary | ICD-10-CM | POA: Diagnosis not present

## 2016-11-16 DIAGNOSIS — M5416 Radiculopathy, lumbar region: Secondary | ICD-10-CM | POA: Diagnosis not present

## 2016-11-16 DIAGNOSIS — M4317 Spondylolisthesis, lumbosacral region: Secondary | ICD-10-CM | POA: Diagnosis not present

## 2016-11-16 DIAGNOSIS — M4120 Other idiopathic scoliosis, site unspecified: Secondary | ICD-10-CM | POA: Diagnosis not present

## 2016-11-17 DIAGNOSIS — M25562 Pain in left knee: Secondary | ICD-10-CM | POA: Diagnosis not present

## 2016-11-17 DIAGNOSIS — G8929 Other chronic pain: Secondary | ICD-10-CM

## 2016-11-17 MED ORDER — LIDOCAINE HCL 1 % IJ SOLN
5.0000 mL | INTRAMUSCULAR | Status: AC | PRN
  Administered 2016-11-17: 5 mL

## 2016-11-17 MED ORDER — BUPIVACAINE HCL 0.25 % IJ SOLN
4.0000 mL | INTRAMUSCULAR | Status: AC | PRN
  Administered 2016-11-17: 4 mL via INTRA_ARTICULAR

## 2016-11-17 MED ORDER — METHYLPREDNISOLONE ACETATE 40 MG/ML IJ SUSP
40.0000 mg | INTRAMUSCULAR | Status: AC | PRN
  Administered 2016-11-17: 40 mg via INTRA_ARTICULAR

## 2016-11-17 NOTE — Progress Notes (Signed)
Office Visit Note   Patient: [name]           Date of Birth: 1940/10/16           MRN: [id number] Visit Date: 11/14/2016 Requested by: [address], [place], [place] [address] [place], [place] [address] PCP: Carollee Herter, Alferd Apa, DO  Subjective: Chief Complaint  Patient presents with  . Left Knee - Pain    HPI: Glendoris is a patient with left knee pain.  She has a known history of left knee arthritis.  Previous injection helped one half years.  She describes primarily rmedial sided pain.  She wonders if she has ever works the left knee from her spine surgery which she had 3 months ago.  She is on doxycycline for bilateral leg rash from swelling.  Patient also describes right shoulder pain which hurt primarily having use her arms to push herself up out of the chair since her back surgery.              ROS: All systems reviewed are negative as they relate to the chief complaint within the history of present illness.  Patient denies  fevers or chills.   Assessment & Plan: Visit Diagnoses:  1. Chronic pain of left knee   2. Chronic right shoulder pain     Plan: Impression is left knee degenerative changes with good response to injection in the past.  We will repeat that injection today.  Also try her on some Relafen 500 mg a day for 4 weeks with 1 refill.  I'll see her back for shoulder injection if that doesn't improve when she is able to become more mobile.  Follow-Up Instructions: Return if symptoms worsen or fail to improve.   Orders:  No orders of the defined types were placed in this encounter.  Meds ordered this encounter  Medications  . nabumetone (RELAFEN) 500 MG tablet    Sig: 1 po q d x 4 weeks    Dispense:  30 tablet    Refill:  0      Procedures: Large Joint Inj Date/Time: 11/17/2016 12:18 AM Performed by: Meredith Pel Authorized by: Meredith Pel   Consent Given by:  Patient Site marked: the procedure site was marked   Timeout:  prior to procedure the correct patient, procedure, and site was verified   Indications:  Pain, joint swelling and diagnostic evaluation Location:  Knee Site:  L knee Prep: patient was prepped and draped in usual sterile fashion   Needle Size:  18 G Needle Length:  1.5 inches Approach:  Superolateral Ultrasound Guidance: No   Fluoroscopic Guidance: No   Arthrogram: No   Medications:  5 mL lidocaine 1 %; 4 mL bupivacaine 0.25 %; 40 mg methylPREDNISolone acetate 40 MG/ML Patient tolerance:  Patient tolerated the procedure well with no immediate complications     Clinical Data: No additional findings.  Objective: Vital Signs: There were no vitals taken for this visit.  Physical Exam:   Constitutional: Patient appears well-developed HEENT:  Head: Normocephalic Eyes:EOM are normal Neck: Normal range of motion Cardiovascular: Normal rate Pulmonary/chest: Effort normal Neurologic: Patient is alert Skin: Skin is warm Psychiatric: Patient has normal mood and affect    Ortho Exam: Orthopedic exam demonstrates fairly good passive and active shoulder range of motion with no course grinding or crepitus with internal/external rotation.  Pedal pulses palpable.  Motor sensory function to the arm shoulder and intact.  No  scapular dyskinesia for flexion.  No other masses lymph adenopathy or skin changes noted in the shoulder girdle region.  Negative apprehension relocation testing.  Left knee demonstrates medial joint line tenderness and a cyst mechanism palpable pedal pulses 7 o'clock crucial ligaments positive McMurray compression testing no groin pain with internal/external rotation leg no other masses lymph adenopathy or skin changes noted in the left knee region.  Specialty Comments:  No specialty comments available.  Imaging: No results found.   PMFS History: Patient Active Problem List   Diagnosis Date Noted  . Lumbar scoliosis 08/12/2016  . Controlled diabetes mellitus type  2 with complications ([place]) 79/39/0300  . Scoliosis 11/24/2015  . Low back pain 09/29/2014  . Diabetes mellitus with peripheral vascular disease ([facility]) 04/10/2013  . Left carotid bruit 04/10/2013  . Anemia 03/31/2013  . Degenerative arthritis of hip 06/01/2012  . POSTMENOPAUSAL SYNDROME 09/16/2009  . ARTHRALGIA 09/16/2009  . Seasonal and perennial allergic rhinitis 10/08/2007  . OSTEOPENIA 08/06/2007  . ELEVATED BLOOD PRESSURE WITHOUT DIAGNOSIS OF HYPERTENSION 08/06/2007  . HYPERLIPIDEMIA 05/14/2007  . Asthma, mild intermittent 05/14/2007  . History of pulmonary embolus (PE) 08/31/2006   Past Medical History:  Diagnosis Date  . Anemia   . Arthritis   . Asthma    adult onset  . Basal cell cancer    LUE; Porokeratosis also  . Chronic kidney disease 1963   strep in kidney due to strep throat-hospitalized 10 days  . Complication of anesthesia    small trachea  . Diabetes mellitus 2010   A1c 6.7%  . DVT (deep venous thrombosis) ([facility]) 2006   post immobilization post cns surgery  . GERD (gastroesophageal reflux disease)    very mild  . Granulomatous lung disease ([facility]) 2002   incidental Xray finding  . Hyperlipidemia   . Hypertension 2004   Hypertensive response on Stress Test  . Paralysis ([facility]) 2006   post cervical fusion with spinal sac tear  with hematoma   . PTE (pulmonary thromboembolism) ([facility]) 2006    Family History  Problem Relation Age of Onset  . COPD Father        emphysema  . Cancer Mother        cns cancer  . Diabetes Sister        TWIN sister ; also Fibromyalgia ; S/P stent 2004  . Heart disease Sister        stents @ 23 & 52  . Stroke Maternal Grandmother        in  late 49s  . Transient ischemic attack Paternal Aunt     Past Surgical History:  Procedure Laterality Date  . ABDOMINAL EXPOSURE N/A 08/12/2016   Procedure: ABDOMINAL EXPOSURE;  Surgeon: Rosetta Posner, MD;  Location: [facility];  Service: Vascular;  Laterality: N/A;  . ANTERIOR LAT LUMBAR FUSION  N/A 08/12/2016   Procedure: LUMBAR TWO-THREE, LUMBAR THREE-FOUR, LUMBAR FOUR-FIVE  ANTEROLATERAL LUMBAR INTERBODY FUSION;  Surgeon: Erline Levine, MD;  Location: [facility];  Service: Neurosurgery;  Laterality: N/A;  L2-3 L3-4 L4-5 Anterolateral lumbar interbody fusion  . ANTERIOR LUMBAR FUSION N/A 08/12/2016   Procedure: Lumbar Five-Sacral One Anterior lumbar interbody fusion with Dr. Sherren Mocha Early to assist;  Surgeon: Erline Levine, MD;  Location: [facility];  Service: Neurosurgery;  Laterality: N/A;  L5-S1 Anterior lumbar interbody fusion with Dr. Sherren Mocha Early to assist  . BUNIONECTOMY    . CERVICAL FUSION  2006   Dr Annette Stable, NS;post op hematoma & cns leak & urinary retention  .  COLONOSCOPY  1992 & 2002   negative  . epidural steroids  2006   cervical spine  . LUMBAR PERCUTANEOUS PEDICLE SCREW 4 LEVEL N/A 08/12/2016   Procedure: LUMBAR TWO-SACRAL ONE Percuataneous Pedicle Screws;  Surgeon: Erline Levine, MD;  Location: [facility];  Service: Neurosurgery;  Laterality: N/A;  . ROTATOR CUFF REPAIR  2009   Right  . SEPTOPLASTY    . TONSILLECTOMY  76 years old  . TOTAL HIP ARTHROPLASTY  06/01/2012   Procedure: TOTAL HIP ARTHROPLASTY ANTERIOR APPROACH;  Surgeon: Mcarthur Rossetti, MD;  Location[facility];  Service: Orthopedics;  Laterality: Left;  Left Total Hip Arthroplasty  . TUBAL LIGATION     Social History   Occupational History  . Not on file.   Social History Main Topics  . Smoking status: Never Smoker  . Smokeless tobacco: Never Used  . Alcohol use 0.0 oz/week     Comment: twice a week; 2-4/week  . Drug use: No  . Sexual activity: Not Currently

## 2016-11-21 DIAGNOSIS — H40013 Open angle with borderline findings, low risk, bilateral: Secondary | ICD-10-CM | POA: Diagnosis not present

## 2016-11-21 DIAGNOSIS — E119 Type 2 diabetes mellitus without complications: Secondary | ICD-10-CM | POA: Diagnosis not present

## 2016-11-21 LAB — HM DIABETES EYE EXAM

## 2016-11-29 ENCOUNTER — Encounter: Payer: Medicare Other | Admitting: Family Medicine

## 2016-12-01 NOTE — Progress Notes (Signed)
Subjective:   [name] is a 76 y.o. female who presents for Medicare Annual (Subsequent) preventive examination.  Review of Systems:  No ROS.  Medicare Wellness Visit. Additional risk factors are reflected in the social history.  Cardiac Risk Factors include: advanced age (>66men, >44 women);diabetes mellitus;dyslipidemia Sleep patterns: Sleeps 4 hrs. Gets up for a bit. Then sleeps another 4 hrs. Generally feels rested. Home Safety/Smoke Alarms: Feels safe in home. Smoke alarms in place.  Living environment; residence and Firearm Safety: Lives alone in 1 story home.  Seat Belt Safety/Bike Helmet: Wears seat belt.   Counseling:   Eye Exam- Wearing trifocals. Dr.Groat yearly. Dental- every 6 months and as needed.  Female:   Pap- No longer doing routine screening due to age.   Mammo- Last 12/21/15: BI-RADS CATEGORY  1: Negative.  ORDERED TODAY FOR AFTER 12/21/16. Dexa scan-  Last 12/21/15:  osteopenia   CCS- Last 05/30/00: result not on file.    Objective:     Vitals: BP 126/60 (BP Location: Right Arm, Patient Position: Sitting, Cuff Size: Normal)   Pulse 64   Ht 5\' 3"  (1.6 m)   Wt 138 lb 3.2 oz (62.7 kg)   SpO2 98%   BMI 24.48 kg/m   Body mass index is 24.48 kg/m.   Tobacco History  Smoking Status  . Never Smoker  Smokeless Tobacco  . Never Used     Counseling given: Not Answered   Past Medical History:  Diagnosis Date  . Anemia   . Arthritis   . Asthma    adult onset  . Basal cell cancer    LUE; Porokeratosis also  . Chronic kidney disease 1963   strep in kidney due to strep throat-hospitalized 10 days  . Complication of anesthesia    small trachea  . Diabetes mellitus 2010   A1c 6.7%  . DVT (deep venous thrombosis) ([facility]) 2006   post immobilization post cns surgery  . GERD (gastroesophageal reflux disease)    very mild  . Granulomatous lung disease ([facility]) 2002   incidental Xray finding  . Hyperlipidemia   . Hypertension 2004   Hypertensive response  on Stress Test  . Paralysis ([facility]) 2006   post cervical fusion with spinal sac tear  with hematoma   . PTE (pulmonary thromboembolism) ([facility]) 2006   Past Surgical History:  Procedure Laterality Date  . ABDOMINAL EXPOSURE N/A 08/12/2016   Procedure: ABDOMINAL EXPOSURE;  Surgeon: Rosetta Posner, MD;  Location: [facility];  Service: Vascular;  Laterality: N/A;  . ANTERIOR LAT LUMBAR FUSION N/A 08/12/2016   Procedure: LUMBAR TWO-THREE, LUMBAR THREE-FOUR, LUMBAR FOUR-FIVE  ANTEROLATERAL LUMBAR INTERBODY FUSION;  Surgeon: Erline Levine, MD;  Location: [facility];  Service: Neurosurgery;  Laterality: N/A;  L2-3 L3-4 L4-5 Anterolateral lumbar interbody fusion  . ANTERIOR LUMBAR FUSION N/A 08/12/2016   Procedure: Lumbar Five-Sacral One Anterior lumbar interbody fusion with Dr. Sherren Mocha Early to assist;  Surgeon: Erline Levine, MD;  Location: [facility];  Service: Neurosurgery;  Laterality: N/A;  L5-S1 Anterior lumbar interbody fusion with Dr. Sherren Mocha Early to assist  . BUNIONECTOMY    . CERVICAL FUSION  2006   Dr Annette Stable, NS;post op hematoma & cns leak & urinary retention  . COLONOSCOPY  1992 & 2002   negative  . epidural steroids  2006   cervical spine  . LUMBAR PERCUTANEOUS PEDICLE SCREW 4 LEVEL N/A 08/12/2016   Procedure: LUMBAR TWO-SACRAL ONE Percuataneous Pedicle Screws;  Surgeon: Erline Levine, MD;  Location[facility];  Service: Neurosurgery;  Laterality: N/A;  . ROTATOR CUFF REPAIR  2009   Right  . SEPTOPLASTY    . TONSILLECTOMY  76 years old  . TOTAL HIP ARTHROPLASTY  06/01/2012   Procedure: TOTAL HIP ARTHROPLASTY ANTERIOR APPROACH;  Surgeon: Mcarthur Rossetti, MD;  [facility];  Service: Orthopedics;  Laterality: Left;  Left Total Hip Arthroplasty  . TUBAL LIGATION     Family History  Problem Relation Age of Onset  . COPD Father        emphysema  . Cancer Mother        cns cancer  . Diabetes Sister        TWIN sister ; also Fibromyalgia ; S/P stent 2004  . Heart disease Sister        stents @ 47 & 97  .  Stroke Maternal Grandmother        in  late 67s  . Transient ischemic attack Paternal Aunt    History  Sexual Activity  . Sexual activity: No    Outpatient Encounter Prescriptions as of 12/05/2016  Medication Sig  . albuterol (PROAIR HFA) 108 (90 Base) MCG/ACT inhaler Inhale 2 puffs into the lungs every 6 (six) hours as needed for wheezing or shortness of breath.  [name] alendronate (FOSAMAX) 70 MG tablet Take 1 tablet (70 mg total) by mouth every 7 (seven) days. Take with a full glass of water on an empty stomach.  . Ca Phosphate-Cholecalciferol (CALCIUM 500 + D3) 250-500 MG-UNIT CHEW Chew by mouth.  . cholecalciferol (VITAMIN D) 1000 UNITS tablet Take 1,000 Units by mouth daily.   [name] docusate sodium (COLACE) 100 MG capsule Take 100-200 mg by mouth every morning.  . Fluticasone-Salmeterol (ADVAIR DISKUS) 250-50 MCG/DOSE AEPB Use 1 inhalation two times  daily ;rinse mouth  . gabapentin (NEURONTIN) 300 MG capsule Take 300 mg by mouth 3 (three) times daily.   [name] glucose blood (ONETOUCH VERIO) test strip Check blood sugar twice daily  . hydrochlorothiazide (MICROZIDE) 12.5 MG capsule Take 1 capsule (12.5 mg total) by mouth daily.  [name] HYDROcodone-acetaminophen (NORCO/VICODIN) 5-325 MG tablet Take 1 tablet by mouth every 6 (six) hours as needed for moderate pain.   . metFORMIN (GLUCOPHAGE-XR) 500 MG 24 hr tablet Take 1 tablet (500 mg total) by mouth daily with breakfast.  . methocarbamol (ROBAXIN) 500 MG tablet Take 1 tablet by mouth 3 (three) times daily.  . Multiple Vitamin (MULTIVITAMIN WITH MINERALS) TABS tablet Take 1 tablet by mouth daily.  . Multiple Vitamins-Minerals (OCUVITE EXTRA PO) Take 1 tablet by mouth daily.  [name] MYRBETRIQ 50 MG TB24 tablet TAKE 1 TABLET(50 MG) BY MOUTH DAILY  . nabumetone (RELAFEN) 500 MG tablet 1 po q d x 4 weeks  . omeprazole (PRILOSEC) 20 MG capsule Take 20 mg by mouth daily as needed.  Glory Rosebush DELICA LANCETS FINE MISC Check blood sugar twice daily  . polyethylene  glycol (MIRALAX / GLYCOLAX) packet Take 17 g by mouth daily.  [name] aspirin 81 MG tablet Take 81 mg by mouth daily.  . [DISCONTINUED] cephALEXin (KEFLEX) 500 MG capsule Take 1 capsule (500 mg total) by mouth 2 (two) times daily.  . [DISCONTINUED] cyclobenzaprine (FLEXERIL) 10 MG tablet TAKE 1 TABLET BY MOUTH AS NEEDED FOR HIP CONCERNS  . [DISCONTINUED] doxycycline (VIBRA-TABS) 100 MG tablet Take 1 tablet (100 mg total) by mouth 2 (two) times daily. Can give caps or generic   No facility-administered encounter medications on file as of 12/05/2016.  Activities of Daily Living In your present state of health, do you have any difficulty performing the following activities: 12/05/2016 08/14/2016  Hearing? N N  Vision? N N  Difficulty concentrating or making decisions? N N  Walking or climbing stairs? Y Y  Dressing or bathing? N Y  Doing errands, shopping? Tempie Donning  Preparing Food and eating ? N -  Using the Toilet? N -  In the past six months, have you accidently leaked urine? N -  Do you have problems with loss of bowel control? N -  Managing your Medications? N -  Managing your Finances? N -  Housekeeping or managing your Housekeeping? N -  Some recent data might be hidden    Patient Care Team: Carollee Herter, Alferd Apa, DO as PCP - General (Family Medicine) Deneise Lever, MD as Consulting Physician (Pulmonary Disease) Erline Levine, MD as Consulting Physician (Neurosurgery)    Assessment:    Physical assessment deferred to PCP.  Exercise Activities and Dietary recommendations   Diet (meal preparation, eat out, water intake, caffeinated beverages, dairy products, fruits and vegetables): well balanced   Goals    . Have overall increased strength      Fall Risk Fall Risk  12/05/2016 11/24/2015 06/12/2015 06/02/2014 06/02/2014  Falls in the past year? No No No No Yes  Number falls in past yr: - - - - 1  Injury with Fall? - - - - No   Depression Screen PHQ 2/9 Scores 12/05/2016 09/19/2016  11/24/2015 06/12/2015  PHQ - 2 Score 0 0 0 0     Cognitive Function MMSE - Mini Mental State Exam 12/05/2016  Orientation to time 5  Orientation to Place 5  Registration 3  Attention/ Calculation 4  Recall 3  Language- name 2 objects 2  Language- repeat 1  Language- follow 3 step command 3  Language- read & follow direction 1  Write a sentence 1  Copy design 1  Total score 29        Immunization History  Administered Date(s) Administered  . Influenza Split 03/15/2012  . Influenza Whole 02/27/2009  . Influenza, High Dose Seasonal PF 03/19/2013, 03/21/2016  . Influenza,inj,Quad PF,36+ Mos 03/13/2014, 02/16/2015  . Pneumococcal Conjugate-13 05/16/2013  . Pneumococcal Polysaccharide-23 09/29/2014  . Pneumococcal-Unspecified 05/16/2013  . Tdap 10/28/2010  . Zoster 05/12/2014   Screening Tests Health Maintenance  Topic Date Due  . COLONOSCOPY  05/30/2010  . OPHTHALMOLOGY EXAM  07/01/2015  . PNA vac Low Risk Adult (2 of 2 - PCV13) 09/29/2015  . INFLUENZA VACCINE  12/28/2016  . HEMOGLOBIN A1C  02/03/2017  . FOOT EXAM  06/03/2017  . URINE MICROALBUMIN  06/03/2017  . TETANUS/TDAP  10/27/2020  . DEXA SCAN  Completed      Plan:   Follow up with PCP today as scheduled.  Continue to eat heart healthy diet (full of fruits, vegetables, whole grains, lean protein, water--limit salt, fat, and sugar intake) and increase physical activity as tolerated.  Continue doing brain stimulating activities (puzzles, reading, adult coloring books, staying active) to keep memory sharp.   Schedule mammogram. Order placed for after 12/21/16.  I have personally reviewed and noted the following in the patient's chart:   . Medical and social history . Use of alcohol, tobacco or illicit drugs  . Current medications and supplements . Functional ability and status . Nutritional status . Physical activity . Advanced directives . List of other physicians . Hospitalizations, surgeries, and ER  visits in previous 4  months . Vitals . Screenings to include cognitive, depression, and falls . Referrals and appointments  In addition, I have reviewed and discussed with patient certain preventive protocols, quality metrics, and best practice recommendations. A written personalized care plan for preventive services as well as general preventive health recommendations were provided to patient.     Shela Nevin, [place]  12/05/2016

## 2016-12-05 ENCOUNTER — Encounter: Payer: Self-pay | Admitting: Family Medicine

## 2016-12-05 ENCOUNTER — Ambulatory Visit (INDEPENDENT_AMBULATORY_CARE_PROVIDER_SITE_OTHER): Payer: Medicare Other | Admitting: Family Medicine

## 2016-12-05 VITALS — BP 126/60 | HR 64 | Ht 63.0 in | Wt 138.2 lb

## 2016-12-05 DIAGNOSIS — Z0000 Encounter for general adult medical examination without abnormal findings: Secondary | ICD-10-CM

## 2016-12-05 DIAGNOSIS — Z0001 Encounter for general adult medical examination with abnormal findings: Secondary | ICD-10-CM

## 2016-12-05 DIAGNOSIS — Z1239 Encounter for other screening for malignant neoplasm of breast: Secondary | ICD-10-CM

## 2016-12-05 DIAGNOSIS — E1151 Type 2 diabetes mellitus with diabetic peripheral angiopathy without gangrene: Secondary | ICD-10-CM

## 2016-12-05 DIAGNOSIS — R609 Edema, unspecified: Secondary | ICD-10-CM

## 2016-12-05 DIAGNOSIS — R82998 Other abnormal findings in urine: Secondary | ICD-10-CM

## 2016-12-05 DIAGNOSIS — Z1231 Encounter for screening mammogram for malignant neoplasm of breast: Secondary | ICD-10-CM | POA: Diagnosis not present

## 2016-12-05 DIAGNOSIS — R8299 Other abnormal findings in urine: Secondary | ICD-10-CM | POA: Diagnosis not present

## 2016-12-05 LAB — COMPREHENSIVE METABOLIC PANEL
ALT: 14 U/L (ref 0–35)
AST: 15 U/L (ref 0–37)
Albumin: 3.9 g/dL (ref 3.5–5.2)
Alkaline Phosphatase: 72 U/L (ref 39–117)
BUN: 17 mg/dL (ref 6–23)
CHLORIDE: 104 meq/L (ref 96–112)
CO2: 28 mEq/L (ref 19–32)
CREATININE: 0.89 mg/dL (ref 0.40–1.20)
Calcium: 9.8 mg/dL (ref 8.4–10.5)
GFR: 65.57 mL/min (ref >60.00)
GLUCOSE: 116 mg/dL — AB (ref 70–99)
Potassium: 4.5 mEq/L (ref 3.5–5.1)
Sodium: 138 mEq/L (ref 135–145)
Total Bilirubin: 0.3 mg/dL (ref 0.2–1.2)
Total Protein: 7.3 g/dL (ref 6.0–8.3)

## 2016-12-05 LAB — CBC WITH DIFFERENTIAL/PLATELET
BASOS ABS: 0 10*3/uL (ref 0.0–0.1)
BASOS PCT: 0.4 % (ref 0.0–3.0)
EOS ABS: 0.1 10*3/uL (ref 0.0–0.7)
Eosinophils Relative: 1.1 % (ref 0.0–5.0)
HEMATOCRIT: 31.1 % — AB (ref 36.0–46.0)
Hemoglobin: 11.2 g/dL — ABNORMAL LOW (ref 12.0–15.0)
LYMPHS ABS: 1.5 10*3/uL (ref 0.7–4.0)
LYMPHS PCT: 25.8 % (ref 12.0–46.0)
MCHC: 36 g/dL (ref 30.0–36.0)
MCV: 91.2 fl (ref 78.0–100.0)
MONO ABS: 0.4 10*3/uL (ref 0.1–1.0)
Monocytes Relative: 7.8 % (ref 3.0–12.0)
NEUTROS ABS: 3.7 10*3/uL (ref 1.4–7.7)
NEUTROS PCT: 64.9 % (ref 43.0–77.0)
PLATELETS: 239 10*3/uL (ref 150.0–400.0)
RBC: 3.41 Mil/uL — ABNORMAL LOW (ref 3.87–5.11)
RDW: 16.2 % — AB (ref 11.5–15.5)
WBC: 5.8 10*3/uL (ref 4.0–10.5)

## 2016-12-05 LAB — POC URINALSYSI DIPSTICK (AUTOMATED)
Bilirubin, UA: NEGATIVE
Glucose, UA: NEGATIVE
Ketones, UA: NEGATIVE
Leukocytes, UA: NEGATIVE
Nitrite, UA: NEGATIVE
Protein, UA: NEGATIVE
RBC UA: NEGATIVE
SPEC GRAV UA: 1.025 (ref 1.010–1.025)
Urobilinogen, UA: 0.2 E.U./dL
pH, UA: 6 (ref 5.0–8.0)

## 2016-12-05 LAB — MICROALBUMIN / CREATININE URINE RATIO
CREATININE, U: 105 mg/dL
Microalb Creat Ratio: 0.7 mg/g (ref 0.0–30.0)
Microalb, Ur: <0.7 mg/dL (ref 0.0–1.9)

## 2016-12-05 LAB — LIPID PANEL
CHOL/HDL RATIO: 4
CHOLESTEROL: 240 mg/dL — AB (ref 0–200)
HDL: 65.6 mg/dL (ref >39.00)
LDL CALC: 145 mg/dL — AB (ref 0–99)
NonHDL: 174.38
TRIGLYCERIDES: 146 mg/dL (ref 0.0–149.0)
VLDL: 29.2 mg/dL (ref 0.0–40.0)

## 2016-12-05 LAB — HEMOGLOBIN A1C: Hgb A1c MFr Bld: 6.4 % (ref 4.6–6.5)

## 2016-12-05 NOTE — Progress Notes (Signed)
Subjective:     [name] is a 76 y.o. female and is here for a comprehensive physical exam. The patient reports problems - edema --- b/l .  Social History   Social History  . Marital status: Divorced    Spouse name: N/A  . Number of children: N/A  . Years of education: N/A   Occupational History  . Not on file.   Social History Main Topics  . Smoking status: Never Smoker  . Smokeless tobacco: Never Used  . Alcohol use 0.0 oz/week     Comment: twice a week; 2-4/week  . Drug use: No  . Sexual activity: No   Other Topics Concern  . Not on file   Social History Narrative  . No narrative on file   Health Maintenance  Topic Date Due  . COLONOSCOPY  05/30/2010  . OPHTHALMOLOGY EXAM  07/01/2015  . PNA vac Low Risk Adult (2 of 2 - PCV13) 09/29/2015  . MAMMOGRAM  12/20/2016  . INFLUENZA VACCINE  12/28/2016  . HEMOGLOBIN A1C  02/03/2017  . FOOT EXAM  06/03/2017  . URINE MICROALBUMIN  06/03/2017  . TETANUS/TDAP  10/27/2020  . DEXA SCAN  Completed    The following portions of the patient's history were reviewed and updated as appropriate: She  has a past medical history of Anemia; Arthritis; Asthma; Basal cell cancer; Chronic kidney disease (1963); Complication of anesthesia; Diabetes mellitus (2010); DVT (deep venous thrombosis) ([place]) (2006); GERD (gastroesophageal reflux disease); Granulomatous lung disease ([place]) (2002); Hyperlipidemia; Hypertension (2004); Paralysis ([place]) (2006); and PTE (pulmonary thromboembolism) ([place]) (2006). She  does not have any pertinent problems on file. She  has a past surgical history that includes Bunionectomy; Septoplasty; Tubal ligation; Colonoscopy (1992 & 2002); Rotator cuff repair (2009); epidural steroids (2006); Cervical fusion (2006); Tonsillectomy (76 years old); Total hip arthroplasty (06/01/2012); Anterior lumbar fusion (N/A, 08/12/2016); Anterior lat lumbar fusion (N/A, 08/12/2016); Lumbar percutaneous pedicle screw 4 level (N/A, 08/12/2016);  and Abdominal exposure (N/A, 08/12/2016). Her family history includes COPD in her father; Cancer in her mother; Diabetes in her sister; Heart disease in her sister; Stroke in her maternal grandmother; Transient ischemic attack in her paternal aunt. She  reports that she has never smoked. She has never used smokeless tobacco. She reports that she drinks alcohol. She reports that she does not use drugs. She has a current medication list which includes the following prescription(s): albuterol, alendronate, ca phosphate-cholecalciferol, cholecalciferol, docusate sodium, fluticasone-salmeterol, gabapentin, glucose blood, hydrochlorothiazide, hydrocodone-acetaminophen, metformin, methocarbamol, multivitamin with minerals, multiple vitamins-minerals, myrbetriq, nabumetone, omeprazole, onetouch delica lancets fine, polyethylene glycol, and aspirin. Current Outpatient Prescriptions on File Prior to Visit  Medication Sig Dispense Refill  . albuterol (PROAIR HFA) 108 (90 Base) MCG/ACT inhaler Inhale 2 puffs into the lungs every 6 (six) hours as needed for wheezing or shortness of breath. 3 Inhaler 3  . alendronate (FOSAMAX) 70 MG tablet Take 1 tablet (70 mg total) by mouth every 7 (seven) days. Take with a full glass of water on an empty stomach. 4 tablet 11  . Ca Phosphate-Cholecalciferol (CALCIUM 500 + D3) 250-500 MG-UNIT CHEW Chew by mouth.    . cholecalciferol (VITAMIN D) 1000 UNITS tablet Take 1,000 Units by mouth daily.     [name] docusate sodium (COLACE) 100 MG capsule Take 100-200 mg by mouth every morning.    . Fluticasone-Salmeterol (ADVAIR DISKUS) 250-50 MCG/DOSE AEPB Use 1 inhalation two times  daily ;rinse mouth 180 each 3  . gabapentin (NEURONTIN) 300 MG capsule Take  300 mg by mouth 3 (three) times daily.   1  . glucose blood (ONETOUCH VERIO) test strip Check blood sugar twice daily 200 each 3  . hydrochlorothiazide (MICROZIDE) 12.5 MG capsule Take 1 capsule (12.5 mg total) by mouth daily. 30 capsule 0   . HYDROcodone-acetaminophen (NORCO/VICODIN) 5-325 MG tablet Take 1 tablet by mouth every 6 (six) hours as needed for moderate pain.   0  . metFORMIN (GLUCOPHAGE-XR) 500 MG 24 hr tablet Take 1 tablet (500 mg total) by mouth daily with breakfast. 90 tablet 6  . methocarbamol (ROBAXIN) 500 MG tablet Take 1 tablet by mouth 3 (three) times daily.  0  . Multiple Vitamin (MULTIVITAMIN WITH MINERALS) TABS tablet Take 1 tablet by mouth daily.    . Multiple Vitamins-Minerals (OCUVITE EXTRA PO) Take 1 tablet by mouth daily.    [name] MYRBETRIQ 50 MG TB24 tablet TAKE 1 TABLET(50 MG) BY MOUTH DAILY 30 tablet 5  . nabumetone (RELAFEN) 500 MG tablet 1 po q d x 4 weeks 30 tablet 0  . omeprazole (PRILOSEC) 20 MG capsule Take 20 mg by mouth daily as needed.    Glory Rosebush DELICA LANCETS FINE MISC Check blood sugar twice daily 200 each 3  . polyethylene glycol (MIRALAX / GLYCOLAX) packet Take 17 g by mouth daily.    [name] aspirin 81 MG tablet Take 81 mg by mouth daily.     No current facility-administered medications on file prior to visit.    She is allergic to statins..  Review of Systems Review of Systems  Constitutional: Negative for activity change, appetite change and fatigue.  HENT: Negative for hearing loss, congestion, tinnitus and ear discharge.  dentist q69m Eyes: Negative for visual disturbance (see optho q1y -- vision corrected to 20/20 with glasses).  Respiratory: Negative for cough, chest tightness and shortness of breath.   Cardiovascular: Negative for chest pain, palpitations and leg swelling.  Gastrointestinal: Negative for abdominal pain, diarrhea, constipation and abdominal distention.  Genitourinary: Negative for urgency, frequency, decreased urine volume and difficulty urinating.  Musculoskeletal: Negative for back pain, arthralgias and gait problem.  Skin: Negative for color change, pallor and rash.  Neurological: Negative for dizziness, light-headedness, numbness and headaches.   Hematological: Negative for adenopathy. Does not bruise/bleed easily.  Psychiatric/Behavioral: Negative for suicidal ideas, confusion, sleep disturbance, self-injury, dysphoric mood, decreased concentration and agitation.       Objective:    BP 126/60 (BP Location: Right Arm, Patient Position: Sitting, Cuff Size: Normal)   Pulse 64   Ht 5\' 3"  (1.6 m)   Wt 138 lb 3.2 oz (62.7 kg)   SpO2 98%   BMI 24.48 kg/m  General appearance: alert, cooperative, appears stated age and no distress Head: Normocephalic, without obvious abnormality, atraumatic Eyes: negative findings: lids and lashes normal, conjunctivae and sclerae normal and pupils equal, round, reactive to light and accomodation Ears: normal TM's and external ear canals both ears Nose: Nares normal. Septum midline. Mucosa normal. No drainage or sinus tenderness. Throat: lips, mucosa, and tongue normal; teeth and gums normal Neck: no adenopathy, no carotid bruit, no JVD, supple, symmetrical, trachea midline and thyroid not enlarged, symmetric, no tenderness/mass/nodules Back: symmetric, no curvature. ROM normal. No CVA tenderness. Lungs: clear to auscultation bilaterally Breasts: normal appearance, no masses or tenderness Heart: regular rate and rhythm, S1, S2 normal, no murmur, click, rub or gallop Abdomen: soft, non-tender; bowel sounds normal; no masses,  no organomegaly Pelvic: not indicated; post-menopausal, no abnormal Pap smears in past Extremities: edema +  2 pitting edema b/l   Pulses: 2+ and symmetric Skin: Skin color, texture, turgor normal. No rashes or lesions Lymph nodes: Cervical, supraclavicular, and axillary nodes normal. Neurologic:weakness in R leg after back surgery--- had PT and is walking with walker and ready to start PT again     Assessment:    Healthy female exam.     Plan:    ghm utd Check labs See After Visit Summary for Counseling Recommendations    1. Encounter for Medicare annual wellness  exam See above  2. Breast cancer screening   - MM DIGITAL SCREENING BILATERAL; Future  3. DM (diabetes mellitus) type II, controlled, with peripheral vascular disorder ([place]) con't meds hgba1c to be done, minimize simple carbs. Increase exercise as tolerated. Continue current meds  - Lipid panel - CBC with Differential/Platelet - Comprehensive metabolic panel - Hemoglobin A1c - Microalbumin / creatinine urine ratio - POCT Urinalysis Dipstick (Automated)  4. Preventative health care See above  5. Leukocytes in urine   - Urine Culture

## 2016-12-05 NOTE — Patient Instructions (Addendum)
[name] , Thank you for taking time to come for your Medicare Wellness Visit. I appreciate your ongoing commitment to your health goals. Please review the following plan we discussed and let me know if I can assist you in the future.   These are the goals we discussed: Goals    . Have overall increased strength       This is a list of the screening recommended for you and due dates:  Health Maintenance  Topic Date Due  . Colon Cancer Screening  05/30/2010  . Eye exam for diabetics  07/01/2015  . Pneumonia vaccines (2 of 2 - PCV13) 09/29/2015  . Flu Shot  12/28/2016  . Hemoglobin A1C  02/03/2017  . Complete foot exam   06/03/2017  . Urine Protein Check  06/03/2017  . Tetanus Vaccine  10/27/2020  . DEXA scan (bone density measurement)  Completed   Continue to eat heart healthy diet (full of fruits, vegetables, whole grains, lean protein, water--limit salt, fat, and sugar intake) and increase physical activity as tolerated.  Continue doing brain stimulating activities (puzzles, reading, adult coloring books, staying active) to keep memory sharp.   Schedule mammogram. Order placed for after 12/21/16. Health Maintenance for Postmenopausal Women Menopause is a normal process in which your reproductive ability comes to an end. This process happens gradually over a span of months to years, usually between the ages of 26 and 12. Menopause is complete when you have missed 12 consecutive menstrual periods. It is important to talk with your health care provider about some of the most common conditions that affect postmenopausal women, such as heart disease, cancer, and bone loss (osteoporosis). Adopting a healthy lifestyle and getting preventive care can help to promote your health and wellness. Those actions can also lower your chances of developing some of these common conditions. What should I know about menopause? During menopause, you may experience a number of symptoms, such  as:  Moderate-to-severe hot flashes.  Night sweats.  Decrease in sex drive.  Mood swings.  Headaches.  Tiredness.  Irritability.  Memory problems.  Insomnia.  Choosing to treat or not to treat menopausal changes is an individual decision that you make with your health care provider. What should I know about hormone replacement therapy and supplements? Hormone therapy products are effective for treating symptoms that are associated with menopause, such as hot flashes and night sweats. Hormone replacement carries certain risks, especially as you become older. If you are thinking about using estrogen or estrogen with progestin treatments, discuss the benefits and risks with your health care provider. What should I know about heart disease and stroke? Heart disease, heart attack, and stroke become more likely as you age. This may be due, in part, to the hormonal changes that your body experiences during menopause. These can affect how your body processes dietary fats, triglycerides, and cholesterol. Heart attack and stroke are both medical emergencies. There are many things that you can do to help prevent heart disease and stroke:  Have your blood pressure checked at least every 1-2 years. High blood pressure causes heart disease and increases the risk of stroke.  If you are 76-66 years old, ask your health care provider if you should take aspirin to prevent a heart attack or a stroke.  Do not use any tobacco products, including cigarettes, chewing tobacco, or electronic cigarettes. If you need help quitting, ask your health care provider.  It is important to eat a healthy diet and maintain a  healthy weight. ? Be sure to include plenty of vegetables, fruits, low-fat dairy products, and lean protein. ? Avoid eating foods that are high in solid fats, added sugars, or salt (sodium).  Get regular exercise. This is one of the most important things that you can do for your health. ? Try  to exercise for at least 150 minutes each week. The type of exercise that you do should increase your heart rate and make you sweat. This is known as moderate-intensity exercise. ? Try to do strengthening exercises at least twice each week. Do these in addition to the moderate-intensity exercise.  Know your numbers.Ask your health care provider to check your cholesterol and your blood glucose. Continue to have your blood tested as directed by your health care provider.  What should I know about cancer screening? There are several types of cancer. Take the following steps to reduce your risk and to catch any cancer development as early as possible. Breast Cancer  Practice breast self-awareness. ? This means understanding how your breasts normally appear and feel. ? It also means doing regular breast self-exams. Let your health care provider know about any changes, no matter how small.  If you are 51 or older, have a clinician do a breast exam (clinical breast exam or CBE) every year. Depending on your age, family history, and medical history, it may be recommended that you also have a yearly breast X-ray (mammogram).  If you have a family history of breast cancer, talk with your health care provider about genetic screening.  If you are at high risk for breast cancer, talk with your health care provider about having an MRI and a mammogram every year.  Breast cancer (BRCA) gene test is recommended for women who have family members with BRCA-related cancers. Results of the assessment will determine the need for genetic counseling and BRCA1 and for BRCA2 testing. BRCA-related cancers include these types: ? Breast. This occurs in males or females. ? Ovarian. ? Tubal. This may also be called fallopian tube cancer. ? Cancer of the abdominal or pelvic lining (peritoneal cancer). ? Prostate. ? Pancreatic.  Cervical, Uterine, and Ovarian Cancer Your health care provider may recommend that you be  screened regularly for cancer of the pelvic organs. These include your ovaries, uterus, and vagina. This screening involves a pelvic exam, which includes checking for microscopic changes to the surface of your cervix (Pap test).  For women ages 21-65, health care providers may recommend a pelvic exam and a Pap test every three years. For women ages 59-65, they may recommend the Pap test and pelvic exam, combined with testing for human papilloma virus (HPV), every five years. Some types of HPV increase your risk of cervical cancer. Testing for HPV may also be done on women of any age who have unclear Pap test results.  Other health care providers may not recommend any screening for nonpregnant women who are considered low risk for pelvic cancer and have no symptoms. Ask your health care provider if a screening pelvic exam is right for you.  If you have had past treatment for cervical cancer or a condition that could lead to cancer, you need Pap tests and screening for cancer for at least 20 years after your treatment. If Pap tests have been discontinued for you, your risk factors (such as having a new sexual partner) need to be reassessed to determine if you should start having screenings again. Some women have medical problems that increase the chance of  getting cervical cancer. In these cases, your health care provider may recommend that you have screening and Pap tests more often.  If you have a family history of uterine cancer or ovarian cancer, talk with your health care provider about genetic screening.  If you have vaginal bleeding after reaching menopause, tell your health care provider.  There are currently no reliable tests available to screen for ovarian cancer.  Lung Cancer Lung cancer screening is recommended for adults 6-51 years old who are at high risk for lung cancer because of a history of smoking. A yearly low-dose CT scan of the lungs is recommended if you:  Currently  smoke.  Have a history of at least 30 pack-years of smoking and you currently smoke or have quit within the past 15 years. A pack-year is smoking an average of one pack of cigarettes per day for one year.  Yearly screening should:  Continue until it has been 15 years since you quit.  Stop if you develop a health problem that would prevent you from having lung cancer treatment.  Colorectal Cancer  This type of cancer can be detected and can often be prevented.  Routine colorectal cancer screening usually begins at age 43 and continues through age 4.  If you have risk factors for colon cancer, your health care provider may recommend that you be screened at an earlier age.  If you have a family history of colorectal cancer, talk with your health care provider about genetic screening.  Your health care provider may also recommend using home test kits to check for hidden blood in your stool.  A small camera at the end of a tube can be used to examine your colon directly (sigmoidoscopy or colonoscopy). This is done to check for the earliest forms of colorectal cancer.  Direct examination of the colon should be repeated every 5-10 years until age 55. However, if early forms of precancerous polyps or small growths are found or if you have a family history or genetic risk for colorectal cancer, you may need to be screened more often.  Skin Cancer  Check your skin from head to toe regularly.  Monitor any moles. Be sure to tell your health care provider: ? About any new moles or changes in moles, especially if there is a change in a mole's shape or color. ? If you have a mole that is larger than the size of a pencil eraser.  If any of your family members has a history of skin cancer, especially at a young age, talk with your health care provider about genetic screening.  Always use sunscreen. Apply sunscreen liberally and repeatedly throughout the day.  Whenever you are outside, protect  yourself by wearing long sleeves, pants, a wide-brimmed hat, and sunglasses.  What should I know about osteoporosis? Osteoporosis is a condition in which bone destruction happens more quickly than new bone creation. After menopause, you may be at an increased risk for osteoporosis. To help prevent osteoporosis or the bone fractures that can happen because of osteoporosis, the following is recommended:  If you are 61-47 years old, get at least 1,000 mg of calcium and at least 600 mg of vitamin D per day.  If you are older than age 23 but younger than age 20, get at least 1,200 mg of calcium and at least 600 mg of vitamin D per day.  If you are older than age 70, get at least 1,200 mg of calcium and at least 800  mg of vitamin D per day.  Smoking and excessive alcohol intake increase the risk of osteoporosis. Eat foods that are rich in calcium and vitamin D, and do weight-bearing exercises several times each week as directed by your health care provider. What should I know about how menopause affects my mental health? Depression may occur at any age, but it is more common as you become older. Common symptoms of depression include:  Low or sad mood.  Changes in sleep patterns.  Changes in appetite or eating patterns.  Feeling an overall lack of motivation or enjoyment of activities that you previously enjoyed.  Frequent crying spells.  Talk with your health care provider if you think that you are experiencing depression. What should I know about immunizations? It is important that you get and maintain your immunizations. These include:  Tetanus, diphtheria, and pertussis (Tdap) booster vaccine.  Influenza every year before the flu season begins.  Pneumonia vaccine.  Shingles vaccine.  Your health care provider may also recommend other immunizations. This information is not intended to replace advice given to you by your health care provider. Make sure you discuss any questions you  have with your health care provider. Document Released: 07/08/2005 Document Revised: 12/04/2015 Document Reviewed: 02/17/2015 Elsevier Interactive Patient Education  2018 [employer].

## 2016-12-07 LAB — URINE CULTURE

## 2016-12-09 ENCOUNTER — Encounter: Payer: Self-pay | Admitting: *Deleted

## 2016-12-12 ENCOUNTER — Other Ambulatory Visit: Payer: Self-pay | Admitting: Family Medicine

## 2016-12-12 DIAGNOSIS — D649 Anemia, unspecified: Secondary | ICD-10-CM

## 2016-12-12 DIAGNOSIS — E119 Type 2 diabetes mellitus without complications: Secondary | ICD-10-CM

## 2016-12-12 DIAGNOSIS — E785 Hyperlipidemia, unspecified: Secondary | ICD-10-CM

## 2016-12-12 NOTE — Addendum Note (Signed)
Addendum  created 12/12/16 1726 by Annye Asa, MD   Sign clinical note

## 2016-12-22 ENCOUNTER — Telehealth: Payer: Self-pay | Admitting: Family Medicine

## 2016-12-22 NOTE — Telephone Encounter (Signed)
I think I need more information

## 2016-12-22 NOTE — Telephone Encounter (Signed)
°  Relation to [id number] Call back [place], [place] - [address] AT [place] [phone] (Phone) [phone] (Phone) [phone] (Fax)     Reason for call:  Patient requesting anti depressant  due to her having trouble walking, patient daughter suggested welbutrin please advise. Patient states she called [facility] and she's awaiting a call back and wanted to inform she did not start physical therapy

## 2016-12-22 NOTE — Telephone Encounter (Signed)
Spoke to the patient and she is having some right leg weakness/unstable, she is having therapy to start back in the next week.. Her daughter was the one to suggest an antidepressant?  She is not needing anything for depression, she is just struggling to walk and thinks the family thinks she may be depressed.  She states she has never been depressed and does not know what it feels like? She has been picking her nails.

## 2016-12-22 NOTE — Telephone Encounter (Signed)
I would need to see her to rx an antidepressant

## 2016-12-23 NOTE — Telephone Encounter (Signed)
Called the patient informed of PCP instructions. She has no transportation until Monday. Will discuss with daughter who is coming to her home on Monday, will schedule appt at that time if needed.

## 2017-01-03 ENCOUNTER — Encounter (INDEPENDENT_AMBULATORY_CARE_PROVIDER_SITE_OTHER): Payer: Self-pay | Admitting: Orthopaedic Surgery

## 2017-01-03 ENCOUNTER — Ambulatory Visit (INDEPENDENT_AMBULATORY_CARE_PROVIDER_SITE_OTHER): Payer: Medicare Other | Admitting: Orthopaedic Surgery

## 2017-01-03 DIAGNOSIS — G8929 Other chronic pain: Secondary | ICD-10-CM

## 2017-01-03 DIAGNOSIS — M25511 Pain in right shoulder: Secondary | ICD-10-CM | POA: Diagnosis not present

## 2017-01-03 NOTE — Progress Notes (Signed)
Office Visit Note   Patient: [name]           Date of Birth: [birth year]           MRN: [id number] Visit Date: 01/03/2017              Requested by: [address], [place], [place] [address] [place], [place] [address] PCP: Carollee Herter, Alferd Apa, DO   Assessment & Plan: Visit Diagnoses:  1. Chronic right shoulder pain     Plan: Subacromial injection was performed today. Please tolerates well. Follow-up as needed.  Follow-Up Instructions: Return if symptoms worsen or fail to improve.   Orders:  No orders of the defined types were placed in this encounter.  No orders of the defined types were placed in this encounter.     Procedures: Large Joint Inj Date/Time: 01/03/2017 9:04 AM Performed by: Leandrew Koyanagi Authorized by: Leandrew Koyanagi   Consent Given by:  Patient Timeout: prior to procedure the correct patient, procedure, and site was verified   Indications:  Pain Location:  Shoulder Site:  R subacromial bursa Prep: patient was prepped and draped in usual sterile fashion   Needle Size:  22 G Approach:  Posterior Ultrasound Guidance: No   Fluoroscopic Guidance: No       Clinical Data: No additional findings.   Subjective: Chief Complaint  Patient presents with  . Right Shoulder - Pain    Patient comes in today for continued right shoulder pain. She tried a month of anti-inflammatories without any significant relief. She continues to have pain. She is requesting injection.    Review of Systems   Objective: Vital Signs: There were no vitals taken for this visit.  Physical Exam  Ortho Exam Right shoulder exam shows positive Hawkins impingement and positive cross adduction. Rotator cuff testing is grossly intact. Specialty Comments:  No specialty comments available.  Imaging: No results found.   PMFS History: Patient Active Problem List   Diagnosis Date Noted  . Lumbar scoliosis 08/12/2016  . Controlled diabetes mellitus type 2  with complications ([facility]) 19/37/9024  . Scoliosis 11/24/2015  . Low back pain 09/29/2014  . Diabetes mellitus with peripheral vascular disease ([facility]) 04/10/2013  . Left carotid bruit 04/10/2013  . Anemia 03/31/2013  . Degenerative arthritis of hip 06/01/2012  . POSTMENOPAUSAL SYNDROME 09/16/2009  . ARTHRALGIA 09/16/2009  . Seasonal and perennial allergic rhinitis 10/08/2007  . OSTEOPENIA 08/06/2007  . ELEVATED BLOOD PRESSURE WITHOUT DIAGNOSIS OF HYPERTENSION 08/06/2007  . HYPERLIPIDEMIA 05/14/2007  . Asthma, mild intermittent 05/14/2007  . History of pulmonary embolus (PE) 08/31/2006   Past Medical History:  Diagnosis Date  . Anemia   . Arthritis   . Asthma    adult onset  . Basal cell cancer    LUE; Porokeratosis also  . Chronic kidney disease 1963   strep in kidney due to strep throat-hospitalized 10 days  . Complication of anesthesia    small trachea  . Diabetes mellitus 2010   A1c 6.7%  . DVT (deep venous thrombosis) ([facility]) 2006   post immobilization post cns surgery  . GERD (gastroesophageal reflux disease)    very mild  . Granulomatous lung disease ([facility]) 2002   incidental Xray finding  . Hyperlipidemia   . Hypertension 2004   Hypertensive response on Stress Test  . Paralysis ([facility]) 2006   post cervical fusion with spinal sac tear  with hematoma   . PTE (pulmonary thromboembolism) ([facility]) 2006  Family History  Problem Relation Age of Onset  . COPD Father        emphysema  . Cancer Mother        cns cancer  . Diabetes Sister        TWIN sister ; also Fibromyalgia ; S/P stent 2004  . Heart disease Sister        stents @ 54 & 30  . Stroke Maternal Grandmother        in  late 60s  . Transient ischemic attack Paternal Aunt     Past Surgical History:  Procedure Laterality Date  . ABDOMINAL EXPOSURE N/A 08/12/2016   Procedure: ABDOMINAL EXPOSURE;  Surgeon: Rosetta Posner, MD;  Location: [facility];  Service: Vascular;  Laterality: N/A;  . ANTERIOR LAT LUMBAR FUSION N/A  08/12/2016   Procedure: LUMBAR TWO-THREE, LUMBAR THREE-FOUR, LUMBAR FOUR-FIVE  ANTEROLATERAL LUMBAR INTERBODY FUSION;  Surgeon: Erline Levine, MD;  Location: [facility];  Service: Neurosurgery;  Laterality: N/A;  L2-3 L3-4 L4-5 Anterolateral lumbar interbody fusion  . ANTERIOR LUMBAR FUSION N/A 08/12/2016   Procedure: Lumbar Five-Sacral One Anterior lumbar interbody fusion with Dr. Sherren Mocha Early to assist;  Surgeon: Erline Levine, MD;  Location: [facility];  Service: Neurosurgery;  Laterality: N/A;  L5-S1 Anterior lumbar interbody fusion with Dr. Sherren Mocha Early to assist  . BUNIONECTOMY    . CERVICAL FUSION  2006   Dr Annette Stable, [place];post op hematoma & cns leak & urinary retention  . COLONOSCOPY  1992 & 2002   negative  . epidural steroids  2006   cervical spine  . LUMBAR PERCUTANEOUS PEDICLE SCREW 4 LEVEL N/A 08/12/2016   Procedure: LUMBAR TWO-SACRAL ONE Percuataneous Pedicle Screws;  Surgeon: Erline Levine, MD;  Location: [facility];  Service: Neurosurgery;  Laterality: N/A;  . ROTATOR CUFF REPAIR  2009   Right  . SEPTOPLASTY    . TONSILLECTOMY  76 years old  . TOTAL HIP ARTHROPLASTY  06/01/2012   Procedure: TOTAL HIP ARTHROPLASTY ANTERIOR APPROACH;  Surgeon: Mcarthur Rossetti, MD;  Location: [facility];  Service: Orthopedics;  Laterality: Left;  Left Total Hip Arthroplasty  . TUBAL LIGATION     Social History   Occupational History  . Not on file.   Social History Main Topics  . Smoking status: Never Smoker  . Smokeless tobacco: Never Used  . Alcohol use 0.0 oz/week     Comment: twice a week; 2-4/week  . Drug use: No  . Sexual activity: No

## 2017-01-04 DIAGNOSIS — M4120 Other idiopathic scoliosis, site unspecified: Secondary | ICD-10-CM | POA: Diagnosis not present

## 2017-01-04 DIAGNOSIS — E1122 Type 2 diabetes mellitus with diabetic chronic kidney disease: Secondary | ICD-10-CM | POA: Diagnosis not present

## 2017-01-04 DIAGNOSIS — J4520 Mild intermittent asthma, uncomplicated: Secondary | ICD-10-CM | POA: Diagnosis not present

## 2017-01-04 DIAGNOSIS — I129 Hypertensive chronic kidney disease with stage 1 through stage 4 chronic kidney disease, or unspecified chronic kidney disease: Secondary | ICD-10-CM | POA: Diagnosis not present

## 2017-01-04 DIAGNOSIS — M5416 Radiculopathy, lumbar region: Secondary | ICD-10-CM | POA: Diagnosis not present

## 2017-01-04 DIAGNOSIS — E1151 Type 2 diabetes mellitus with diabetic peripheral angiopathy without gangrene: Secondary | ICD-10-CM | POA: Diagnosis not present

## 2017-01-04 DIAGNOSIS — M4317 Spondylolisthesis, lumbosacral region: Secondary | ICD-10-CM | POA: Diagnosis not present

## 2017-01-04 DIAGNOSIS — Z7984 Long term (current) use of oral hypoglycemic drugs: Secondary | ICD-10-CM | POA: Diagnosis not present

## 2017-01-04 DIAGNOSIS — M48062 Spinal stenosis, lumbar region with neurogenic claudication: Secondary | ICD-10-CM | POA: Diagnosis not present

## 2017-01-04 DIAGNOSIS — M8580 Other specified disorders of bone density and structure, unspecified site: Secondary | ICD-10-CM | POA: Diagnosis not present

## 2017-01-04 DIAGNOSIS — N189 Chronic kidney disease, unspecified: Secondary | ICD-10-CM | POA: Diagnosis not present

## 2017-01-04 DIAGNOSIS — M1991 Primary osteoarthritis, unspecified site: Secondary | ICD-10-CM | POA: Diagnosis not present

## 2017-01-04 DIAGNOSIS — G8929 Other chronic pain: Secondary | ICD-10-CM | POA: Diagnosis not present

## 2017-01-10 DIAGNOSIS — M48062 Spinal stenosis, lumbar region with neurogenic claudication: Secondary | ICD-10-CM | POA: Diagnosis not present

## 2017-01-10 DIAGNOSIS — J4520 Mild intermittent asthma, uncomplicated: Secondary | ICD-10-CM | POA: Diagnosis not present

## 2017-01-10 DIAGNOSIS — I129 Hypertensive chronic kidney disease with stage 1 through stage 4 chronic kidney disease, or unspecified chronic kidney disease: Secondary | ICD-10-CM | POA: Diagnosis not present

## 2017-01-10 DIAGNOSIS — Z7984 Long term (current) use of oral hypoglycemic drugs: Secondary | ICD-10-CM | POA: Diagnosis not present

## 2017-01-10 DIAGNOSIS — E1151 Type 2 diabetes mellitus with diabetic peripheral angiopathy without gangrene: Secondary | ICD-10-CM | POA: Diagnosis not present

## 2017-01-10 DIAGNOSIS — M1991 Primary osteoarthritis, unspecified site: Secondary | ICD-10-CM | POA: Diagnosis not present

## 2017-01-10 DIAGNOSIS — E1122 Type 2 diabetes mellitus with diabetic chronic kidney disease: Secondary | ICD-10-CM | POA: Diagnosis not present

## 2017-01-10 DIAGNOSIS — M5416 Radiculopathy, lumbar region: Secondary | ICD-10-CM | POA: Diagnosis not present

## 2017-01-10 DIAGNOSIS — G8929 Other chronic pain: Secondary | ICD-10-CM | POA: Diagnosis not present

## 2017-01-10 DIAGNOSIS — M4120 Other idiopathic scoliosis, site unspecified: Secondary | ICD-10-CM | POA: Diagnosis not present

## 2017-01-10 DIAGNOSIS — M4317 Spondylolisthesis, lumbosacral region: Secondary | ICD-10-CM | POA: Diagnosis not present

## 2017-01-10 DIAGNOSIS — M8580 Other specified disorders of bone density and structure, unspecified site: Secondary | ICD-10-CM | POA: Diagnosis not present

## 2017-01-10 DIAGNOSIS — N189 Chronic kidney disease, unspecified: Secondary | ICD-10-CM | POA: Diagnosis not present

## 2017-01-12 ENCOUNTER — Ambulatory Visit (HOSPITAL_BASED_OUTPATIENT_CLINIC_OR_DEPARTMENT_OTHER)
Admission: RE | Admit: 2017-01-12 | Discharge: 2017-01-12 | Disposition: A | Discharge status: Home / Self Care | Payer: Medicare Other | Source: Ambulatory Visit | Attending: Family Medicine | Admitting: Family Medicine

## 2017-01-12 ENCOUNTER — Other Ambulatory Visit: Payer: Self-pay | Admitting: Family Medicine

## 2017-01-12 DIAGNOSIS — Z1231 Encounter for screening mammogram for malignant neoplasm of breast: Secondary | ICD-10-CM | POA: Insufficient documentation

## 2017-01-12 DIAGNOSIS — Z1239 Encounter for other screening for malignant neoplasm of breast: Secondary | ICD-10-CM

## 2017-01-13 DIAGNOSIS — M4120 Other idiopathic scoliosis, site unspecified: Secondary | ICD-10-CM | POA: Diagnosis not present

## 2017-01-13 DIAGNOSIS — M4317 Spondylolisthesis, lumbosacral region: Secondary | ICD-10-CM | POA: Diagnosis not present

## 2017-01-13 DIAGNOSIS — M1991 Primary osteoarthritis, unspecified site: Secondary | ICD-10-CM | POA: Diagnosis not present

## 2017-01-13 DIAGNOSIS — E1122 Type 2 diabetes mellitus with diabetic chronic kidney disease: Secondary | ICD-10-CM | POA: Diagnosis not present

## 2017-01-13 DIAGNOSIS — E1151 Type 2 diabetes mellitus with diabetic peripheral angiopathy without gangrene: Secondary | ICD-10-CM | POA: Diagnosis not present

## 2017-01-13 DIAGNOSIS — M5416 Radiculopathy, lumbar region: Secondary | ICD-10-CM | POA: Diagnosis not present

## 2017-01-13 DIAGNOSIS — Z7984 Long term (current) use of oral hypoglycemic drugs: Secondary | ICD-10-CM | POA: Diagnosis not present

## 2017-01-13 DIAGNOSIS — J4520 Mild intermittent asthma, uncomplicated: Secondary | ICD-10-CM | POA: Diagnosis not present

## 2017-01-13 DIAGNOSIS — G8929 Other chronic pain: Secondary | ICD-10-CM | POA: Diagnosis not present

## 2017-01-13 DIAGNOSIS — I129 Hypertensive chronic kidney disease with stage 1 through stage 4 chronic kidney disease, or unspecified chronic kidney disease: Secondary | ICD-10-CM | POA: Diagnosis not present

## 2017-01-13 DIAGNOSIS — M48062 Spinal stenosis, lumbar region with neurogenic claudication: Secondary | ICD-10-CM | POA: Diagnosis not present

## 2017-01-13 DIAGNOSIS — M8580 Other specified disorders of bone density and structure, unspecified site: Secondary | ICD-10-CM | POA: Diagnosis not present

## 2017-01-13 DIAGNOSIS — N189 Chronic kidney disease, unspecified: Secondary | ICD-10-CM | POA: Diagnosis not present

## 2017-01-16 DIAGNOSIS — E1151 Type 2 diabetes mellitus with diabetic peripheral angiopathy without gangrene: Secondary | ICD-10-CM | POA: Diagnosis not present

## 2017-01-16 DIAGNOSIS — Z7984 Long term (current) use of oral hypoglycemic drugs: Secondary | ICD-10-CM | POA: Diagnosis not present

## 2017-01-16 DIAGNOSIS — M8580 Other specified disorders of bone density and structure, unspecified site: Secondary | ICD-10-CM | POA: Diagnosis not present

## 2017-01-16 DIAGNOSIS — M48062 Spinal stenosis, lumbar region with neurogenic claudication: Secondary | ICD-10-CM | POA: Diagnosis not present

## 2017-01-16 DIAGNOSIS — M5416 Radiculopathy, lumbar region: Secondary | ICD-10-CM | POA: Diagnosis not present

## 2017-01-16 DIAGNOSIS — M1991 Primary osteoarthritis, unspecified site: Secondary | ICD-10-CM | POA: Diagnosis not present

## 2017-01-16 DIAGNOSIS — M4120 Other idiopathic scoliosis, site unspecified: Secondary | ICD-10-CM | POA: Diagnosis not present

## 2017-01-16 DIAGNOSIS — E1122 Type 2 diabetes mellitus with diabetic chronic kidney disease: Secondary | ICD-10-CM | POA: Diagnosis not present

## 2017-01-16 DIAGNOSIS — N189 Chronic kidney disease, unspecified: Secondary | ICD-10-CM | POA: Diagnosis not present

## 2017-01-16 DIAGNOSIS — J4520 Mild intermittent asthma, uncomplicated: Secondary | ICD-10-CM | POA: Diagnosis not present

## 2017-01-16 DIAGNOSIS — M4317 Spondylolisthesis, lumbosacral region: Secondary | ICD-10-CM | POA: Diagnosis not present

## 2017-01-16 DIAGNOSIS — G8929 Other chronic pain: Secondary | ICD-10-CM | POA: Diagnosis not present

## 2017-01-16 DIAGNOSIS — I129 Hypertensive chronic kidney disease with stage 1 through stage 4 chronic kidney disease, or unspecified chronic kidney disease: Secondary | ICD-10-CM | POA: Diagnosis not present

## 2017-01-18 DIAGNOSIS — E1151 Type 2 diabetes mellitus with diabetic peripheral angiopathy without gangrene: Secondary | ICD-10-CM | POA: Diagnosis not present

## 2017-01-18 DIAGNOSIS — Z7984 Long term (current) use of oral hypoglycemic drugs: Secondary | ICD-10-CM | POA: Diagnosis not present

## 2017-01-18 DIAGNOSIS — I129 Hypertensive chronic kidney disease with stage 1 through stage 4 chronic kidney disease, or unspecified chronic kidney disease: Secondary | ICD-10-CM | POA: Diagnosis not present

## 2017-01-18 DIAGNOSIS — M4317 Spondylolisthesis, lumbosacral region: Secondary | ICD-10-CM | POA: Diagnosis not present

## 2017-01-18 DIAGNOSIS — M5416 Radiculopathy, lumbar region: Secondary | ICD-10-CM | POA: Diagnosis not present

## 2017-01-18 DIAGNOSIS — N189 Chronic kidney disease, unspecified: Secondary | ICD-10-CM | POA: Diagnosis not present

## 2017-01-18 DIAGNOSIS — M4120 Other idiopathic scoliosis, site unspecified: Secondary | ICD-10-CM | POA: Diagnosis not present

## 2017-01-18 DIAGNOSIS — M8580 Other specified disorders of bone density and structure, unspecified site: Secondary | ICD-10-CM | POA: Diagnosis not present

## 2017-01-18 DIAGNOSIS — J4520 Mild intermittent asthma, uncomplicated: Secondary | ICD-10-CM | POA: Diagnosis not present

## 2017-01-18 DIAGNOSIS — M48062 Spinal stenosis, lumbar region with neurogenic claudication: Secondary | ICD-10-CM | POA: Diagnosis not present

## 2017-01-18 DIAGNOSIS — G8929 Other chronic pain: Secondary | ICD-10-CM | POA: Diagnosis not present

## 2017-01-18 DIAGNOSIS — M1991 Primary osteoarthritis, unspecified site: Secondary | ICD-10-CM | POA: Diagnosis not present

## 2017-01-18 DIAGNOSIS — E1122 Type 2 diabetes mellitus with diabetic chronic kidney disease: Secondary | ICD-10-CM | POA: Diagnosis not present

## 2017-01-24 DIAGNOSIS — I129 Hypertensive chronic kidney disease with stage 1 through stage 4 chronic kidney disease, or unspecified chronic kidney disease: Secondary | ICD-10-CM | POA: Diagnosis not present

## 2017-01-24 DIAGNOSIS — E1122 Type 2 diabetes mellitus with diabetic chronic kidney disease: Secondary | ICD-10-CM | POA: Diagnosis not present

## 2017-01-24 DIAGNOSIS — M8580 Other specified disorders of bone density and structure, unspecified site: Secondary | ICD-10-CM | POA: Diagnosis not present

## 2017-01-24 DIAGNOSIS — N189 Chronic kidney disease, unspecified: Secondary | ICD-10-CM | POA: Diagnosis not present

## 2017-01-24 DIAGNOSIS — M5416 Radiculopathy, lumbar region: Secondary | ICD-10-CM | POA: Diagnosis not present

## 2017-01-24 DIAGNOSIS — M4120 Other idiopathic scoliosis, site unspecified: Secondary | ICD-10-CM | POA: Diagnosis not present

## 2017-01-24 DIAGNOSIS — M1991 Primary osteoarthritis, unspecified site: Secondary | ICD-10-CM | POA: Diagnosis not present

## 2017-01-24 DIAGNOSIS — M4317 Spondylolisthesis, lumbosacral region: Secondary | ICD-10-CM | POA: Diagnosis not present

## 2017-01-24 DIAGNOSIS — M48062 Spinal stenosis, lumbar region with neurogenic claudication: Secondary | ICD-10-CM | POA: Diagnosis not present

## 2017-01-24 DIAGNOSIS — G8929 Other chronic pain: Secondary | ICD-10-CM | POA: Diagnosis not present

## 2017-01-24 DIAGNOSIS — J4520 Mild intermittent asthma, uncomplicated: Secondary | ICD-10-CM | POA: Diagnosis not present

## 2017-01-24 DIAGNOSIS — Z7984 Long term (current) use of oral hypoglycemic drugs: Secondary | ICD-10-CM | POA: Diagnosis not present

## 2017-01-24 DIAGNOSIS — E1151 Type 2 diabetes mellitus with diabetic peripheral angiopathy without gangrene: Secondary | ICD-10-CM | POA: Diagnosis not present

## 2017-01-25 DIAGNOSIS — Z1212 Encounter for screening for malignant neoplasm of rectum: Secondary | ICD-10-CM | POA: Diagnosis not present

## 2017-01-25 DIAGNOSIS — Z1211 Encounter for screening for malignant neoplasm of colon: Secondary | ICD-10-CM | POA: Diagnosis not present

## 2017-01-25 LAB — COLOGUARD
Cologuard: NEGATIVE
Cologuard: NEGATIVE

## 2017-01-26 DIAGNOSIS — E1151 Type 2 diabetes mellitus with diabetic peripheral angiopathy without gangrene: Secondary | ICD-10-CM | POA: Diagnosis not present

## 2017-01-26 DIAGNOSIS — M5416 Radiculopathy, lumbar region: Secondary | ICD-10-CM | POA: Diagnosis not present

## 2017-01-26 DIAGNOSIS — I129 Hypertensive chronic kidney disease with stage 1 through stage 4 chronic kidney disease, or unspecified chronic kidney disease: Secondary | ICD-10-CM | POA: Diagnosis not present

## 2017-01-26 DIAGNOSIS — Z7984 Long term (current) use of oral hypoglycemic drugs: Secondary | ICD-10-CM | POA: Diagnosis not present

## 2017-01-26 DIAGNOSIS — N189 Chronic kidney disease, unspecified: Secondary | ICD-10-CM | POA: Diagnosis not present

## 2017-01-26 DIAGNOSIS — M4317 Spondylolisthesis, lumbosacral region: Secondary | ICD-10-CM | POA: Diagnosis not present

## 2017-01-26 DIAGNOSIS — J4520 Mild intermittent asthma, uncomplicated: Secondary | ICD-10-CM | POA: Diagnosis not present

## 2017-01-26 DIAGNOSIS — G8929 Other chronic pain: Secondary | ICD-10-CM | POA: Diagnosis not present

## 2017-01-26 DIAGNOSIS — E1122 Type 2 diabetes mellitus with diabetic chronic kidney disease: Secondary | ICD-10-CM | POA: Diagnosis not present

## 2017-01-26 DIAGNOSIS — M8580 Other specified disorders of bone density and structure, unspecified site: Secondary | ICD-10-CM | POA: Diagnosis not present

## 2017-01-26 DIAGNOSIS — M4120 Other idiopathic scoliosis, site unspecified: Secondary | ICD-10-CM | POA: Diagnosis not present

## 2017-01-26 DIAGNOSIS — M48062 Spinal stenosis, lumbar region with neurogenic claudication: Secondary | ICD-10-CM | POA: Diagnosis not present

## 2017-01-26 DIAGNOSIS — M1991 Primary osteoarthritis, unspecified site: Secondary | ICD-10-CM | POA: Diagnosis not present

## 2017-01-27 LAB — COLOGUARD: Cologuard: NEGATIVE

## 2017-01-31 DIAGNOSIS — M48062 Spinal stenosis, lumbar region with neurogenic claudication: Secondary | ICD-10-CM | POA: Diagnosis not present

## 2017-01-31 DIAGNOSIS — I129 Hypertensive chronic kidney disease with stage 1 through stage 4 chronic kidney disease, or unspecified chronic kidney disease: Secondary | ICD-10-CM | POA: Diagnosis not present

## 2017-01-31 DIAGNOSIS — M1991 Primary osteoarthritis, unspecified site: Secondary | ICD-10-CM | POA: Diagnosis not present

## 2017-01-31 DIAGNOSIS — M8580 Other specified disorders of bone density and structure, unspecified site: Secondary | ICD-10-CM | POA: Diagnosis not present

## 2017-01-31 DIAGNOSIS — M4317 Spondylolisthesis, lumbosacral region: Secondary | ICD-10-CM | POA: Diagnosis not present

## 2017-01-31 DIAGNOSIS — M4120 Other idiopathic scoliosis, site unspecified: Secondary | ICD-10-CM | POA: Diagnosis not present

## 2017-01-31 DIAGNOSIS — Z7984 Long term (current) use of oral hypoglycemic drugs: Secondary | ICD-10-CM | POA: Diagnosis not present

## 2017-01-31 DIAGNOSIS — E1151 Type 2 diabetes mellitus with diabetic peripheral angiopathy without gangrene: Secondary | ICD-10-CM | POA: Diagnosis not present

## 2017-01-31 DIAGNOSIS — N189 Chronic kidney disease, unspecified: Secondary | ICD-10-CM | POA: Diagnosis not present

## 2017-01-31 DIAGNOSIS — E1122 Type 2 diabetes mellitus with diabetic chronic kidney disease: Secondary | ICD-10-CM | POA: Diagnosis not present

## 2017-01-31 DIAGNOSIS — J4520 Mild intermittent asthma, uncomplicated: Secondary | ICD-10-CM | POA: Diagnosis not present

## 2017-01-31 DIAGNOSIS — M5416 Radiculopathy, lumbar region: Secondary | ICD-10-CM | POA: Diagnosis not present

## 2017-01-31 DIAGNOSIS — G8929 Other chronic pain: Secondary | ICD-10-CM | POA: Diagnosis not present

## 2017-02-02 DIAGNOSIS — N189 Chronic kidney disease, unspecified: Secondary | ICD-10-CM | POA: Diagnosis not present

## 2017-02-02 DIAGNOSIS — M5416 Radiculopathy, lumbar region: Secondary | ICD-10-CM | POA: Diagnosis not present

## 2017-02-02 DIAGNOSIS — M4317 Spondylolisthesis, lumbosacral region: Secondary | ICD-10-CM | POA: Diagnosis not present

## 2017-02-02 DIAGNOSIS — R030 Elevated blood-pressure reading, without diagnosis of hypertension: Secondary | ICD-10-CM | POA: Diagnosis not present

## 2017-02-02 DIAGNOSIS — J4520 Mild intermittent asthma, uncomplicated: Secondary | ICD-10-CM | POA: Diagnosis not present

## 2017-02-02 DIAGNOSIS — E1151 Type 2 diabetes mellitus with diabetic peripheral angiopathy without gangrene: Secondary | ICD-10-CM | POA: Diagnosis not present

## 2017-02-02 DIAGNOSIS — M8580 Other specified disorders of bone density and structure, unspecified site: Secondary | ICD-10-CM | POA: Diagnosis not present

## 2017-02-02 DIAGNOSIS — M48062 Spinal stenosis, lumbar region with neurogenic claudication: Secondary | ICD-10-CM | POA: Diagnosis not present

## 2017-02-02 DIAGNOSIS — I129 Hypertensive chronic kidney disease with stage 1 through stage 4 chronic kidney disease, or unspecified chronic kidney disease: Secondary | ICD-10-CM | POA: Diagnosis not present

## 2017-02-02 DIAGNOSIS — M4120 Other idiopathic scoliosis, site unspecified: Secondary | ICD-10-CM | POA: Diagnosis not present

## 2017-02-02 DIAGNOSIS — M1991 Primary osteoarthritis, unspecified site: Secondary | ICD-10-CM | POA: Diagnosis not present

## 2017-02-02 DIAGNOSIS — Z7984 Long term (current) use of oral hypoglycemic drugs: Secondary | ICD-10-CM | POA: Diagnosis not present

## 2017-02-02 DIAGNOSIS — E1122 Type 2 diabetes mellitus with diabetic chronic kidney disease: Secondary | ICD-10-CM | POA: Diagnosis not present

## 2017-02-02 DIAGNOSIS — G8929 Other chronic pain: Secondary | ICD-10-CM | POA: Diagnosis not present

## 2017-02-07 DIAGNOSIS — M5416 Radiculopathy, lumbar region: Secondary | ICD-10-CM | POA: Diagnosis not present

## 2017-02-07 DIAGNOSIS — G8929 Other chronic pain: Secondary | ICD-10-CM | POA: Diagnosis not present

## 2017-02-07 DIAGNOSIS — M48062 Spinal stenosis, lumbar region with neurogenic claudication: Secondary | ICD-10-CM | POA: Diagnosis not present

## 2017-02-07 DIAGNOSIS — M4120 Other idiopathic scoliosis, site unspecified: Secondary | ICD-10-CM | POA: Diagnosis not present

## 2017-02-07 DIAGNOSIS — J4520 Mild intermittent asthma, uncomplicated: Secondary | ICD-10-CM | POA: Diagnosis not present

## 2017-02-07 DIAGNOSIS — E1151 Type 2 diabetes mellitus with diabetic peripheral angiopathy without gangrene: Secondary | ICD-10-CM | POA: Diagnosis not present

## 2017-02-07 DIAGNOSIS — I129 Hypertensive chronic kidney disease with stage 1 through stage 4 chronic kidney disease, or unspecified chronic kidney disease: Secondary | ICD-10-CM | POA: Diagnosis not present

## 2017-02-07 DIAGNOSIS — M4317 Spondylolisthesis, lumbosacral region: Secondary | ICD-10-CM | POA: Diagnosis not present

## 2017-02-07 DIAGNOSIS — N189 Chronic kidney disease, unspecified: Secondary | ICD-10-CM | POA: Diagnosis not present

## 2017-02-07 DIAGNOSIS — Z7984 Long term (current) use of oral hypoglycemic drugs: Secondary | ICD-10-CM | POA: Diagnosis not present

## 2017-02-07 DIAGNOSIS — M8580 Other specified disorders of bone density and structure, unspecified site: Secondary | ICD-10-CM | POA: Diagnosis not present

## 2017-02-07 DIAGNOSIS — E1122 Type 2 diabetes mellitus with diabetic chronic kidney disease: Secondary | ICD-10-CM | POA: Diagnosis not present

## 2017-02-07 DIAGNOSIS — M1991 Primary osteoarthritis, unspecified site: Secondary | ICD-10-CM | POA: Diagnosis not present

## 2017-02-09 DIAGNOSIS — E1151 Type 2 diabetes mellitus with diabetic peripheral angiopathy without gangrene: Secondary | ICD-10-CM | POA: Diagnosis not present

## 2017-02-09 DIAGNOSIS — M5416 Radiculopathy, lumbar region: Secondary | ICD-10-CM | POA: Diagnosis not present

## 2017-02-09 DIAGNOSIS — M8580 Other specified disorders of bone density and structure, unspecified site: Secondary | ICD-10-CM | POA: Diagnosis not present

## 2017-02-09 DIAGNOSIS — G8929 Other chronic pain: Secondary | ICD-10-CM | POA: Diagnosis not present

## 2017-02-09 DIAGNOSIS — Z7984 Long term (current) use of oral hypoglycemic drugs: Secondary | ICD-10-CM | POA: Diagnosis not present

## 2017-02-09 DIAGNOSIS — I129 Hypertensive chronic kidney disease with stage 1 through stage 4 chronic kidney disease, or unspecified chronic kidney disease: Secondary | ICD-10-CM | POA: Diagnosis not present

## 2017-02-09 DIAGNOSIS — J4520 Mild intermittent asthma, uncomplicated: Secondary | ICD-10-CM | POA: Diagnosis not present

## 2017-02-09 DIAGNOSIS — M48062 Spinal stenosis, lumbar region with neurogenic claudication: Secondary | ICD-10-CM | POA: Diagnosis not present

## 2017-02-09 DIAGNOSIS — E1122 Type 2 diabetes mellitus with diabetic chronic kidney disease: Secondary | ICD-10-CM | POA: Diagnosis not present

## 2017-02-09 DIAGNOSIS — N189 Chronic kidney disease, unspecified: Secondary | ICD-10-CM | POA: Diagnosis not present

## 2017-02-09 DIAGNOSIS — M4317 Spondylolisthesis, lumbosacral region: Secondary | ICD-10-CM | POA: Diagnosis not present

## 2017-02-09 DIAGNOSIS — M1991 Primary osteoarthritis, unspecified site: Secondary | ICD-10-CM | POA: Diagnosis not present

## 2017-02-09 DIAGNOSIS — M4120 Other idiopathic scoliosis, site unspecified: Secondary | ICD-10-CM | POA: Diagnosis not present

## 2017-02-13 ENCOUNTER — Other Ambulatory Visit (INDEPENDENT_AMBULATORY_CARE_PROVIDER_SITE_OTHER): Payer: Self-pay | Admitting: Physician Assistant

## 2017-02-13 ENCOUNTER — Ambulatory Visit (INDEPENDENT_AMBULATORY_CARE_PROVIDER_SITE_OTHER): Payer: Medicare Other | Admitting: Physician Assistant

## 2017-02-13 ENCOUNTER — Ambulatory Visit (INDEPENDENT_AMBULATORY_CARE_PROVIDER_SITE_OTHER): Payer: Medicare Other

## 2017-02-13 ENCOUNTER — Encounter (INDEPENDENT_AMBULATORY_CARE_PROVIDER_SITE_OTHER): Payer: Self-pay | Admitting: Physician Assistant

## 2017-02-13 DIAGNOSIS — G8929 Other chronic pain: Secondary | ICD-10-CM | POA: Diagnosis not present

## 2017-02-13 DIAGNOSIS — M25562 Pain in left knee: Secondary | ICD-10-CM

## 2017-02-13 DIAGNOSIS — M1711 Unilateral primary osteoarthritis, right knee: Secondary | ICD-10-CM

## 2017-02-13 DIAGNOSIS — M25561 Pain in right knee: Secondary | ICD-10-CM | POA: Diagnosis not present

## 2017-02-13 MED ORDER — CELECOXIB 200 MG PO CAPS: 200.0000 mg | ORAL_CAPSULE | Freq: Every day | ORAL | 1 refills | Status: DC

## 2017-02-13 NOTE — Progress Notes (Signed)
Office Visit Note   Patient: [name]           Date of Birth: 02-May-1941           MRN: [id number] Visit Date: 02/13/2017              Requested by: [address], [place], [place] [address] [place], [place] [address] PCP: Carollee Herter, Alferd Apa, DO   Assessment & Plan: Visit Diagnoses:  1. Chronic pain of left knee   2. Primary osteoarthritis of right knee     Plan: She'll begin taking Celebrex pain and inflammation. We'll send her to physical therapy initially to be done by the therapist is seen for gait imbalance in her back and then transitioning to outpatient therapy. Prescription written for range of motion strengthening both knees modalities to the medial collateral ligament of left knee. She'll follow with [place] on an as-needed basis pain persist or becomes worse.  Follow-Up Instructions: Return if symptoms worsen or fail to improve.   Orders:  Orders Placed This Encounter  Procedures  . XR KNEE 3 VIEW LEFT  . XR Knee 1-2 Views Right   Meds ordered this encounter  Medications  . celecoxib (CELEBREX) 200 MG capsule    Sig: Take 1 capsule (200 mg total) by mouth daily.    Dispense:  30 capsule    Refill:  1      Procedures: No procedures performed   Clinical Data: No additional findings.   Subjective: Chief Complaint  Patient presents with  . Right Knee - Pain  . Left Knee - Pain    HPI [name] comes in today due to right knee and left knee pain. She states her right knee pops and cracks. But really having no pain in the knee. Her left knee she is having pain medial aspect knee she's been told that her abductor muscle is causing her pain by the therapist is questioning if Botox would be a reasonable treatment for this. She currently is recovering from spinal surgery which she had 6 months ago and has physical therapy at home. She's had no known injury to either knee. Left knee is only been bothering her for the past month. No mechanical  symptoms of either knee. She is quite a sitting Celebrex for her knee pain as is his work well for her in the past. Review of Systems Please see history of present illness otherwise negative  Objective: Vital Signs: There were no vitals taken for this visit.  Physical Exam  Constitutional: She is oriented to person, place, and time. She appears well-developed and well-nourished. No distress.  Pulmonary/Chest: Effort normal.  Neurological: She is alert and oriented to person, place, and time.  Skin: She is not diaphoretic.  Psychiatric: She has a normal mood and affect.    Ortho Exam Bilateral knees full range of motion. No effusion abnormal warmth erythema. Right knee she is nontender throughout. Left knee she's tender along the medial collateral ligament only from its origin to insertion. No laxity with valgus varus stressing of either knee. Right knee with passive range of motion patellofemoral crepitus is present.  Specialty Comments:  No specialty comments available.  Imaging: Xr Knee 1-2 Views Right  Result Date: 02/13/2017 Right knee 3 views: No acute fracture. Spurring off the most lateral medial aspects and tibial plateau. Mild narrowing medial compartment. Mild patellofemoral changes.  Xr Knee 3 View Left  Result Date: 02/13/2017 Left knee 3  views: Mild medial compartmental narrowing. No acute fractures no bony abnormalities otherwise.    PMFS History: Patient Active Problem List   Diagnosis Date Noted  . Lumbar scoliosis 08/12/2016  . Controlled diabetes mellitus type 2 with complications ([facility]) 84/69/6295  . Scoliosis 11/24/2015  . Low back pain 09/29/2014  . Diabetes mellitus with peripheral vascular disease ([facility]) 04/10/2013  . Left carotid bruit 04/10/2013  . Anemia 03/31/2013  . Degenerative arthritis of hip 06/01/2012  . POSTMENOPAUSAL SYNDROME 09/16/2009  . ARTHRALGIA 09/16/2009  . Seasonal and perennial allergic rhinitis 10/08/2007  . OSTEOPENIA  08/06/2007  . ELEVATED BLOOD PRESSURE WITHOUT DIAGNOSIS OF HYPERTENSION 08/06/2007  . HYPERLIPIDEMIA 05/14/2007  . Asthma, mild intermittent 05/14/2007  . History of pulmonary embolus (PE) 08/31/2006   Past Medical History:  Diagnosis Date  . Anemia   . Arthritis   . Asthma    adult onset  . Basal cell cancer    LUE; Porokeratosis also  . Chronic kidney disease 1963   strep in kidney due to strep throat-hospitalized 10 days  . Complication of anesthesia    small trachea  . Diabetes mellitus 2010   A1c 6.7%  . DVT (deep venous thrombosis) ([facility]) 2006   post immobilization post cns surgery  . GERD (gastroesophageal reflux disease)    very mild  . Granulomatous lung disease ([facility]) 2002   incidental Xray finding  . Hyperlipidemia   . Hypertension 2004   Hypertensive response on Stress Test  . Paralysis ([facility]) 2006   post cervical fusion with spinal sac tear  with hematoma   . PTE (pulmonary thromboembolism) ([facility]) 2006    Family History  Problem Relation Age of Onset  . COPD Father        emphysema  . Cancer Mother        cns cancer  . Diabetes Sister        TWIN sister ; also Fibromyalgia ; S/P stent 2004  . Heart disease Sister        stents @ 52 & 57  . Stroke Maternal Grandmother        in  late 65s  . Transient ischemic attack Paternal Aunt     Past Surgical History:  Procedure Laterality Date  . ABDOMINAL EXPOSURE N/A 08/12/2016   Procedure: ABDOMINAL EXPOSURE;  Surgeon: Rosetta Posner, MD;  Location: [facility];  Service: Vascular;  Laterality: N/A;  . ANTERIOR LAT LUMBAR FUSION N/A 08/12/2016   Procedure: LUMBAR TWO-THREE, LUMBAR THREE-FOUR, LUMBAR FOUR-FIVE  ANTEROLATERAL LUMBAR INTERBODY FUSION;  Surgeon: Erline Levine, MD;  Location: [facility];  Service: Neurosurgery;  Laterality: N/A;  L2-3 L3-4 L4-5 Anterolateral lumbar interbody fusion  . ANTERIOR LUMBAR FUSION N/A 08/12/2016   Procedure: Lumbar Five-Sacral One Anterior lumbar interbody fusion with Dr. Sherren Mocha Early to  assist;  Surgeon: Erline Levine, MD;  Location: [facility];  Service: Neurosurgery;  Laterality: N/A;  L5-S1 Anterior lumbar interbody fusion with Dr. Sherren Mocha Early to assist  . BUNIONECTOMY    . CERVICAL FUSION  2006   Dr Annette Stable, [place];post op hematoma & cns leak & urinary retention  . COLONOSCOPY  1992 & 2002   negative  . epidural steroids  2006   cervical spine  . LUMBAR PERCUTANEOUS PEDICLE SCREW 4 LEVEL N/A 08/12/2016   Procedure: LUMBAR TWO-SACRAL ONE Percuataneous Pedicle Screws;  Surgeon: Erline Levine, MD;  Location: [facility];  Service: Neurosurgery;  Laterality: N/A;  . ROTATOR CUFF REPAIR  2009   Right  . SEPTOPLASTY    .  TONSILLECTOMY  76 years old  . TOTAL HIP ARTHROPLASTY  06/01/2012   Procedure: TOTAL HIP ARTHROPLASTY ANTERIOR APPROACH;  Surgeon: Mcarthur Rossetti, MD;  [facility];  Service: Orthopedics;  Laterality: Left;  Left Total Hip Arthroplasty  . TUBAL LIGATION     Social History   Occupational History  . Not on file.   Social History Main Topics  . Smoking status: Never Smoker  . Smokeless tobacco: Never Used  . Alcohol use 0.0 oz/week     Comment: twice a week; 2-4/week  . Drug use: No  . Sexual activity: No

## 2017-02-13 NOTE — Telephone Encounter (Signed)
Please advise 

## 2017-02-14 DIAGNOSIS — M4120 Other idiopathic scoliosis, site unspecified: Secondary | ICD-10-CM | POA: Diagnosis not present

## 2017-02-14 DIAGNOSIS — M4317 Spondylolisthesis, lumbosacral region: Secondary | ICD-10-CM | POA: Diagnosis not present

## 2017-02-14 DIAGNOSIS — M5416 Radiculopathy, lumbar region: Secondary | ICD-10-CM | POA: Diagnosis not present

## 2017-02-14 DIAGNOSIS — G8929 Other chronic pain: Secondary | ICD-10-CM | POA: Diagnosis not present

## 2017-02-14 DIAGNOSIS — I129 Hypertensive chronic kidney disease with stage 1 through stage 4 chronic kidney disease, or unspecified chronic kidney disease: Secondary | ICD-10-CM | POA: Diagnosis not present

## 2017-02-14 DIAGNOSIS — J4520 Mild intermittent asthma, uncomplicated: Secondary | ICD-10-CM | POA: Diagnosis not present

## 2017-02-14 DIAGNOSIS — E1151 Type 2 diabetes mellitus with diabetic peripheral angiopathy without gangrene: Secondary | ICD-10-CM | POA: Diagnosis not present

## 2017-02-14 DIAGNOSIS — N189 Chronic kidney disease, unspecified: Secondary | ICD-10-CM | POA: Diagnosis not present

## 2017-02-14 DIAGNOSIS — M1991 Primary osteoarthritis, unspecified site: Secondary | ICD-10-CM | POA: Diagnosis not present

## 2017-02-14 DIAGNOSIS — M48062 Spinal stenosis, lumbar region with neurogenic claudication: Secondary | ICD-10-CM | POA: Diagnosis not present

## 2017-02-14 DIAGNOSIS — M8580 Other specified disorders of bone density and structure, unspecified site: Secondary | ICD-10-CM | POA: Diagnosis not present

## 2017-02-14 DIAGNOSIS — Z7984 Long term (current) use of oral hypoglycemic drugs: Secondary | ICD-10-CM | POA: Diagnosis not present

## 2017-02-14 DIAGNOSIS — E1122 Type 2 diabetes mellitus with diabetic chronic kidney disease: Secondary | ICD-10-CM | POA: Diagnosis not present

## 2017-02-16 DIAGNOSIS — M5416 Radiculopathy, lumbar region: Secondary | ICD-10-CM | POA: Diagnosis not present

## 2017-02-16 DIAGNOSIS — I129 Hypertensive chronic kidney disease with stage 1 through stage 4 chronic kidney disease, or unspecified chronic kidney disease: Secondary | ICD-10-CM | POA: Diagnosis not present

## 2017-02-16 DIAGNOSIS — N189 Chronic kidney disease, unspecified: Secondary | ICD-10-CM | POA: Diagnosis not present

## 2017-02-16 DIAGNOSIS — M8580 Other specified disorders of bone density and structure, unspecified site: Secondary | ICD-10-CM | POA: Diagnosis not present

## 2017-02-16 DIAGNOSIS — M1991 Primary osteoarthritis, unspecified site: Secondary | ICD-10-CM | POA: Diagnosis not present

## 2017-02-16 DIAGNOSIS — M4317 Spondylolisthesis, lumbosacral region: Secondary | ICD-10-CM | POA: Diagnosis not present

## 2017-02-16 DIAGNOSIS — M4120 Other idiopathic scoliosis, site unspecified: Secondary | ICD-10-CM | POA: Diagnosis not present

## 2017-02-16 DIAGNOSIS — M48062 Spinal stenosis, lumbar region with neurogenic claudication: Secondary | ICD-10-CM | POA: Diagnosis not present

## 2017-02-16 DIAGNOSIS — J4520 Mild intermittent asthma, uncomplicated: Secondary | ICD-10-CM | POA: Diagnosis not present

## 2017-02-16 DIAGNOSIS — E1122 Type 2 diabetes mellitus with diabetic chronic kidney disease: Secondary | ICD-10-CM | POA: Diagnosis not present

## 2017-02-16 DIAGNOSIS — Z7984 Long term (current) use of oral hypoglycemic drugs: Secondary | ICD-10-CM | POA: Diagnosis not present

## 2017-02-16 DIAGNOSIS — E1151 Type 2 diabetes mellitus with diabetic peripheral angiopathy without gangrene: Secondary | ICD-10-CM | POA: Diagnosis not present

## 2017-02-16 DIAGNOSIS — G8929 Other chronic pain: Secondary | ICD-10-CM | POA: Diagnosis not present

## 2017-02-20 ENCOUNTER — Ambulatory Visit (INDEPENDENT_AMBULATORY_CARE_PROVIDER_SITE_OTHER): Payer: Medicare Other | Admitting: Orthopaedic Surgery

## 2017-02-20 DIAGNOSIS — M4317 Spondylolisthesis, lumbosacral region: Secondary | ICD-10-CM | POA: Diagnosis not present

## 2017-02-20 DIAGNOSIS — M8580 Other specified disorders of bone density and structure, unspecified site: Secondary | ICD-10-CM | POA: Diagnosis not present

## 2017-02-20 DIAGNOSIS — M4120 Other idiopathic scoliosis, site unspecified: Secondary | ICD-10-CM | POA: Diagnosis not present

## 2017-02-20 DIAGNOSIS — N189 Chronic kidney disease, unspecified: Secondary | ICD-10-CM | POA: Diagnosis not present

## 2017-02-20 DIAGNOSIS — Z7984 Long term (current) use of oral hypoglycemic drugs: Secondary | ICD-10-CM | POA: Diagnosis not present

## 2017-02-20 DIAGNOSIS — E1122 Type 2 diabetes mellitus with diabetic chronic kidney disease: Secondary | ICD-10-CM | POA: Diagnosis not present

## 2017-02-20 DIAGNOSIS — E1151 Type 2 diabetes mellitus with diabetic peripheral angiopathy without gangrene: Secondary | ICD-10-CM | POA: Diagnosis not present

## 2017-02-20 DIAGNOSIS — J4520 Mild intermittent asthma, uncomplicated: Secondary | ICD-10-CM | POA: Diagnosis not present

## 2017-02-20 DIAGNOSIS — G8929 Other chronic pain: Secondary | ICD-10-CM | POA: Diagnosis not present

## 2017-02-20 DIAGNOSIS — I129 Hypertensive chronic kidney disease with stage 1 through stage 4 chronic kidney disease, or unspecified chronic kidney disease: Secondary | ICD-10-CM | POA: Diagnosis not present

## 2017-02-20 DIAGNOSIS — M1991 Primary osteoarthritis, unspecified site: Secondary | ICD-10-CM | POA: Diagnosis not present

## 2017-02-20 DIAGNOSIS — M5416 Radiculopathy, lumbar region: Secondary | ICD-10-CM | POA: Diagnosis not present

## 2017-02-20 DIAGNOSIS — M48062 Spinal stenosis, lumbar region with neurogenic claudication: Secondary | ICD-10-CM | POA: Diagnosis not present

## 2017-02-24 DIAGNOSIS — M48062 Spinal stenosis, lumbar region with neurogenic claudication: Secondary | ICD-10-CM | POA: Diagnosis not present

## 2017-02-24 DIAGNOSIS — M1991 Primary osteoarthritis, unspecified site: Secondary | ICD-10-CM | POA: Diagnosis not present

## 2017-02-24 DIAGNOSIS — E1151 Type 2 diabetes mellitus with diabetic peripheral angiopathy without gangrene: Secondary | ICD-10-CM | POA: Diagnosis not present

## 2017-02-24 DIAGNOSIS — E1122 Type 2 diabetes mellitus with diabetic chronic kidney disease: Secondary | ICD-10-CM | POA: Diagnosis not present

## 2017-02-24 DIAGNOSIS — J4520 Mild intermittent asthma, uncomplicated: Secondary | ICD-10-CM | POA: Diagnosis not present

## 2017-02-24 DIAGNOSIS — I129 Hypertensive chronic kidney disease with stage 1 through stage 4 chronic kidney disease, or unspecified chronic kidney disease: Secondary | ICD-10-CM | POA: Diagnosis not present

## 2017-02-24 DIAGNOSIS — G8929 Other chronic pain: Secondary | ICD-10-CM | POA: Diagnosis not present

## 2017-02-24 DIAGNOSIS — M5416 Radiculopathy, lumbar region: Secondary | ICD-10-CM | POA: Diagnosis not present

## 2017-02-24 DIAGNOSIS — Z7984 Long term (current) use of oral hypoglycemic drugs: Secondary | ICD-10-CM | POA: Diagnosis not present

## 2017-02-24 DIAGNOSIS — M4120 Other idiopathic scoliosis, site unspecified: Secondary | ICD-10-CM | POA: Diagnosis not present

## 2017-02-24 DIAGNOSIS — M4317 Spondylolisthesis, lumbosacral region: Secondary | ICD-10-CM | POA: Diagnosis not present

## 2017-02-24 DIAGNOSIS — M8580 Other specified disorders of bone density and structure, unspecified site: Secondary | ICD-10-CM | POA: Diagnosis not present

## 2017-02-24 DIAGNOSIS — N189 Chronic kidney disease, unspecified: Secondary | ICD-10-CM | POA: Diagnosis not present

## 2017-02-27 DIAGNOSIS — M4120 Other idiopathic scoliosis, site unspecified: Secondary | ICD-10-CM | POA: Diagnosis not present

## 2017-02-27 DIAGNOSIS — M8580 Other specified disorders of bone density and structure, unspecified site: Secondary | ICD-10-CM | POA: Diagnosis not present

## 2017-02-27 DIAGNOSIS — G8929 Other chronic pain: Secondary | ICD-10-CM | POA: Diagnosis not present

## 2017-02-27 DIAGNOSIS — M4317 Spondylolisthesis, lumbosacral region: Secondary | ICD-10-CM | POA: Diagnosis not present

## 2017-02-27 DIAGNOSIS — E1151 Type 2 diabetes mellitus with diabetic peripheral angiopathy without gangrene: Secondary | ICD-10-CM | POA: Diagnosis not present

## 2017-02-27 DIAGNOSIS — I129 Hypertensive chronic kidney disease with stage 1 through stage 4 chronic kidney disease, or unspecified chronic kidney disease: Secondary | ICD-10-CM | POA: Diagnosis not present

## 2017-02-27 DIAGNOSIS — Z7984 Long term (current) use of oral hypoglycemic drugs: Secondary | ICD-10-CM | POA: Diagnosis not present

## 2017-02-27 DIAGNOSIS — M1991 Primary osteoarthritis, unspecified site: Secondary | ICD-10-CM | POA: Diagnosis not present

## 2017-02-27 DIAGNOSIS — J4520 Mild intermittent asthma, uncomplicated: Secondary | ICD-10-CM | POA: Diagnosis not present

## 2017-02-27 DIAGNOSIS — M5416 Radiculopathy, lumbar region: Secondary | ICD-10-CM | POA: Diagnosis not present

## 2017-02-27 DIAGNOSIS — E1122 Type 2 diabetes mellitus with diabetic chronic kidney disease: Secondary | ICD-10-CM | POA: Diagnosis not present

## 2017-02-27 DIAGNOSIS — N189 Chronic kidney disease, unspecified: Secondary | ICD-10-CM | POA: Diagnosis not present

## 2017-02-27 DIAGNOSIS — M48062 Spinal stenosis, lumbar region with neurogenic claudication: Secondary | ICD-10-CM | POA: Diagnosis not present

## 2017-03-02 DIAGNOSIS — M1991 Primary osteoarthritis, unspecified site: Secondary | ICD-10-CM | POA: Diagnosis not present

## 2017-03-02 DIAGNOSIS — G8929 Other chronic pain: Secondary | ICD-10-CM | POA: Diagnosis not present

## 2017-03-02 DIAGNOSIS — J4520 Mild intermittent asthma, uncomplicated: Secondary | ICD-10-CM | POA: Diagnosis not present

## 2017-03-02 DIAGNOSIS — Z7984 Long term (current) use of oral hypoglycemic drugs: Secondary | ICD-10-CM | POA: Diagnosis not present

## 2017-03-02 DIAGNOSIS — M48062 Spinal stenosis, lumbar region with neurogenic claudication: Secondary | ICD-10-CM | POA: Diagnosis not present

## 2017-03-02 DIAGNOSIS — M4120 Other idiopathic scoliosis, site unspecified: Secondary | ICD-10-CM | POA: Diagnosis not present

## 2017-03-02 DIAGNOSIS — N189 Chronic kidney disease, unspecified: Secondary | ICD-10-CM | POA: Diagnosis not present

## 2017-03-02 DIAGNOSIS — M8580 Other specified disorders of bone density and structure, unspecified site: Secondary | ICD-10-CM | POA: Diagnosis not present

## 2017-03-02 DIAGNOSIS — M5416 Radiculopathy, lumbar region: Secondary | ICD-10-CM | POA: Diagnosis not present

## 2017-03-02 DIAGNOSIS — E1151 Type 2 diabetes mellitus with diabetic peripheral angiopathy without gangrene: Secondary | ICD-10-CM | POA: Diagnosis not present

## 2017-03-02 DIAGNOSIS — I129 Hypertensive chronic kidney disease with stage 1 through stage 4 chronic kidney disease, or unspecified chronic kidney disease: Secondary | ICD-10-CM | POA: Diagnosis not present

## 2017-03-02 DIAGNOSIS — M4317 Spondylolisthesis, lumbosacral region: Secondary | ICD-10-CM | POA: Diagnosis not present

## 2017-03-02 DIAGNOSIS — E1122 Type 2 diabetes mellitus with diabetic chronic kidney disease: Secondary | ICD-10-CM | POA: Diagnosis not present

## 2017-03-06 DIAGNOSIS — M25562 Pain in left knee: Secondary | ICD-10-CM | POA: Diagnosis not present

## 2017-03-14 DIAGNOSIS — M25562 Pain in left knee: Secondary | ICD-10-CM | POA: Diagnosis not present

## 2017-03-16 DIAGNOSIS — M25562 Pain in left knee: Secondary | ICD-10-CM | POA: Diagnosis not present

## 2017-03-24 ENCOUNTER — Telehealth: Payer: Self-pay | Admitting: Family Medicine

## 2017-03-28 DIAGNOSIS — M25562 Pain in left knee: Secondary | ICD-10-CM | POA: Diagnosis not present

## 2017-03-30 ENCOUNTER — Ambulatory Visit (INDEPENDENT_AMBULATORY_CARE_PROVIDER_SITE_OTHER): Payer: Medicare Other | Admitting: Family Medicine

## 2017-03-30 ENCOUNTER — Encounter: Payer: Self-pay | Admitting: Family Medicine

## 2017-03-30 VITALS — BP 124/47 | HR 68 | Temp 98.2°F | Resp 16 | Ht 63.0 in | Wt 145.6 lb

## 2017-03-30 DIAGNOSIS — M792 Neuralgia and neuritis, unspecified: Secondary | ICD-10-CM

## 2017-03-30 DIAGNOSIS — Z23 Encounter for immunization: Secondary | ICD-10-CM

## 2017-03-30 DIAGNOSIS — J302 Other seasonal allergic rhinitis: Secondary | ICD-10-CM

## 2017-03-30 DIAGNOSIS — J3089 Other allergic rhinitis: Secondary | ICD-10-CM | POA: Diagnosis not present

## 2017-03-30 DIAGNOSIS — M25562 Pain in left knee: Secondary | ICD-10-CM | POA: Diagnosis not present

## 2017-03-30 MED ORDER — PREGABALIN 75 MG PO CAPS: 75.0000 mg | ORAL_CAPSULE | Freq: Two times a day (BID) | ORAL | 3 refills | Status: DC

## 2017-03-30 MED ORDER — ZOSTER VAC RECOMB ADJUVANTED 50 MCG/0.5ML IM SUSR: 0.5000 mL | Freq: Once | INTRAMUSCULAR | 1 refills | Status: AC

## 2017-03-30 MED ORDER — AZELASTINE-FLUTICASONE 137-50 MCG/ACT NA SUSP: NASAL | 5 refills | Status: DC

## 2017-03-30 NOTE — Assessment & Plan Note (Signed)
con't allegra  Restart dymista  rto prn

## 2017-03-30 NOTE — Patient Instructions (Signed)
Neuropathic Pain Neuropathic pain is pain caused by damage to the nerves that are responsible for certain sensations in your body (sensory nerves). The pain can be caused by damage to:  The sensory nerves that send signals to your spinal cord and brain (peripheral nervous system).  The sensory nerves in your brain or spinal cord (central nervous system).  Neuropathic pain can make you more sensitive to pain. What would be a minor sensation for most people may feel very painful if you have neuropathic pain. This is usually a long-term condition that can be difficult to treat. The type of pain can differ from person to person. It may start suddenly (acute), or it may develop slowly and last for a long time (chronic). Neuropathic pain may come and go as damaged nerves heal or may stay at the same level for years. It often causes emotional distress, loss of sleep, and a lower quality of life. What are the causes? The most common cause of damage to a sensory nerve is diabetes. Many other diseases and conditions can also cause neuropathic pain. Causes of neuropathic pain can be classified as:  Toxic. Many drugs and chemicals can cause toxic damage. The most common cause of toxic neuropathic pain is damage from drug treatment for cancer (chemotherapy).  Metabolic. This type of pain can happen when a disease causes imbalances that damage nerves. Diabetes is the most common of these diseases. Vitamin B deficiency caused by long-term alcohol abuse is another common cause.  Traumatic. Any injury that cuts, crushes, or stretches a nerve can cause damage and pain. A common example is feeling pain after losing an arm or leg (phantom limb pain).  Compression-related. If a sensory nerve gets trapped or compressed for a long period of time, the blood supply to the nerve can be cut off.  Vascular. Many blood vessel diseases can cause neuropathic pain by decreasing blood supply and oxygen to nerves.  Autoimmune.  This type of pain results from diseases in which the body's defense system mistakenly attacks sensory nerves. Examples of autoimmune diseases that can cause neuropathic pain include lupus and multiple sclerosis.  Infectious. Many types of viral infections can damage sensory nerves and cause pain. Shingles infection is a common cause of this type of pain.  Inherited. Neuropathic pain can be a symptom of many diseases that are passed down through families (genetic).  What are the signs or symptoms? The main symptom is pain. Neuropathic pain is often described as:  Burning.  Shock-like.  Stinging.  Hot or cold.  Itching.  How is this diagnosed? No single test can diagnose neuropathic pain. Your health care provider will do a physical exam and ask you about your pain. You may use a pain scale to describe how bad your pain is. You may also have tests to see if you have a high sensitivity to pain and to help find the cause and location of any sensory nerve damage. These tests may include:  Imaging studies, such as: ? X-rays. ? CT scan. ? MRI.  Nerve conduction studies to test how well nerve signals travel through your sensory nerves (electrodiagnostic testing).  Stimulating your sensory nerves through electrodes on your skin and measuring the response in your spinal cord and brain (somatosensory evoked potentials).  How is this treated? Treatment for neuropathic pain may change over time. You may need to try different treatment options or a combination of treatments. Some options include:  Over-the-counter pain relievers.  Prescription medicines. Some medicines   used to treat other conditions may also help neuropathic pain. These include medicines to: ? Control seizures (anticonvulsants). ? Relieve depression (antidepressants).  Prescription-strength pain relievers (narcotics). These are usually used when other pain relievers do not help.  Transcutaneous nerve stimulation (TENS).  This uses electrical currents to block painful nerve signals. The treatment is painless.  Topical and local anesthetics. These are medicines that numb the nerves. They can be injected as a nerve block or applied to the skin.  Alternative treatments, such as: ? Acupuncture. ? Meditation. ? Massage. ? Physical therapy. ? Pain management programs. ? Counseling.  Follow these instructions at home:  Learn as much as you can about your condition.  Take medicines only as directed by your health care provider.  Work closely with all your health care providers to find what works best for you.  Have a good support system at home.  Consider joining a chronic pain support group. Contact a health care provider if:  Your pain treatments are not helping.  You are having side effects from your medicines.  You are struggling with fatigue, mood changes, depression, or anxiety. This information is not intended to replace advice given to you by your health care provider. Make sure you discuss any questions you have with your health care provider. Document Released: 02/11/2004 Document Revised: 12/04/2015 Document Reviewed: 10/24/2013 Elsevier Interactive Patient Education  2018 [employer]  

## 2017-03-30 NOTE — Progress Notes (Signed)
Patient ID: [name], female    DOB: November 25, 1940  Age: 76 y.o. MRN: [id number]    Subjective:  Subjective  HPI [name] presents for to discuss change to lyrica from neurontin.   She is also c/o congestion --- she is using the allegra and has dymista but hasnt used it because it is so expernsive.    Review of Systems  Constitutional: Positive for chills. Negative for appetite change, diaphoresis, fatigue, fever and unexpected weight change.  HENT: Positive for congestion, postnasal drip, rhinorrhea and sinus pressure.   Eyes: Negative for pain, redness and visual disturbance.  Respiratory: Negative for cough, chest tightness, shortness of breath and wheezing.   Cardiovascular: Negative for chest pain, palpitations and leg swelling.  Endocrine: Negative for cold intolerance, heat intolerance, polydipsia, polyphagia and polyuria.  Genitourinary: Negative for difficulty urinating, dysuria and frequency.  Musculoskeletal: Positive for arthralgias and back pain.  Allergic/Immunologic: Negative for environmental allergies.  Neurological: Negative for dizziness, light-headedness, numbness and headaches.    History Past Medical History:  Diagnosis Date  . Anemia   . Arthritis   . Asthma    adult onset  . Basal cell cancer    LUE; Porokeratosis also  . Chronic kidney disease 1963   strep in kidney due to strep throat-hospitalized 10 days  . Complication of anesthesia    small trachea  . Diabetes mellitus 2010   A1c 6.7%  . DVT (deep venous thrombosis) ([place]) 2006   post immobilization post cns surgery  . GERD (gastroesophageal reflux disease)    very mild  . Granulomatous lung disease ([place]) 2002   incidental Xray finding  . Hyperlipidemia   . Hypertension 2004   Hypertensive response on Stress Test  . Paralysis ([place]) 2006   post cervical fusion with spinal sac tear  with hematoma   . PTE (pulmonary thromboembolism) ([place]) 2006    She has a past surgical history that  includes Bunionectomy; Septoplasty; Tubal ligation; Colonoscopy (1992 & 2002); Rotator cuff repair (2009); epidural steroids (2006); Cervical fusion (2006); Tonsillectomy (76 years old); Total hip arthroplasty (06/01/2012); Anterior lumbar fusion (N/A, 08/12/2016); Anterior lat lumbar fusion (N/A, 08/12/2016); Lumbar percutaneous pedicle screw 4 level (N/A, 08/12/2016); and Abdominal exposure (N/A, 08/12/2016).   Her family history includes COPD in her father; Cancer in her mother; Diabetes in her sister; Heart disease in her sister; Stroke in her maternal grandmother; Transient ischemic attack in her paternal aunt.She reports that she has never smoked. She has never used smokeless tobacco. She reports that she drinks alcohol. She reports that she does not use drugs.  Current Outpatient Prescriptions on File Prior to Visit  Medication Sig Dispense Refill  . albuterol (PROAIR HFA) 108 (90 Base) MCG/ACT inhaler Inhale 2 puffs into the lungs every 6 (six) hours as needed for wheezing or shortness of breath. 3 Inhaler 3  . alendronate (FOSAMAX) 70 MG tablet TAKE 1 TABLET(70 MG) BY MOUTH EVERY 7 DAYS WITH A FULL GLASS OF WATER AND ON AN EMPTY STOMACH 12 tablet 0  . aspirin 81 MG tablet Take 81 mg by mouth daily.    . Ca Phosphate-Cholecalciferol (CALCIUM 500 + D3) 250-500 MG-UNIT CHEW Chew by mouth.    . celecoxib (CELEBREX) 200 MG capsule TAKE 1 CAPSULE(200 MG) BY MOUTH DAILY 90 capsule 1  . cholecalciferol (VITAMIN D) 1000 UNITS tablet Take 1,000 Units by mouth daily.     [name] docusate sodium (COLACE) 100 MG capsule Take 100-200 mg by mouth every morning.    [name]  Fluticasone-Salmeterol (ADVAIR DISKUS) 250-50 MCG/DOSE AEPB Use 1 inhalation two times  daily ;rinse mouth 180 each 3  . gabapentin (NEURONTIN) 300 MG capsule Take 300 mg by mouth 3 (three) times daily.   1  . glucose blood (ONETOUCH VERIO) test strip Check blood sugar twice daily 200 each 3  . hydrochlorothiazide (MICROZIDE) 12.5 MG capsule Take 1 capsule  (12.5 mg total) by mouth daily. 30 capsule 0  . metFORMIN (GLUCOPHAGE-XR) 500 MG 24 hr tablet Take 1 tablet (500 mg total) by mouth daily with breakfast. 90 tablet 6  . methocarbamol (ROBAXIN) 500 MG tablet Take 1 tablet by mouth 3 (three) times daily.  0  . Multiple Vitamin (MULTIVITAMIN WITH MINERALS) TABS tablet Take 1 tablet by mouth daily.    . Multiple Vitamins-Minerals (OCUVITE EXTRA PO) Take 1 tablet by mouth daily.    [name] MYRBETRIQ 50 MG TB24 tablet TAKE 1 TABLET(50 MG) BY MOUTH DAILY 30 tablet 5  . omeprazole (PRILOSEC) 20 MG capsule Take 20 mg by mouth daily as needed.    Glory Rosebush DELICA LANCETS FINE MISC Check blood sugar twice daily 200 each 3  . polyethylene glycol (MIRALAX / GLYCOLAX) packet Take 17 g by mouth daily.     No current facility-administered medications on file prior to visit.      Objective:  Objective  Physical Exam  Constitutional: She is oriented to person, place, and time. She appears well-developed and well-nourished.  HENT:  Head: Normocephalic and atraumatic.  Right Ear: External ear normal.  Left Ear: External ear normal.  + PND + errythema  Eyes: Conjunctivae and EOM are normal. Right eye exhibits no discharge. Left eye exhibits no discharge.  Neck: Normal range of motion. Neck supple. No JVD present. Carotid bruit is not present. No thyromegaly present.  Cardiovascular: Normal rate, regular rhythm and normal heart sounds.   No murmur heard. Pulmonary/Chest: Effort normal and breath sounds normal. No respiratory distress. She has no wheezes. She has no rales. She exhibits no tenderness.  Musculoskeletal: She exhibits no edema.  Lymphadenopathy:    She has no cervical adenopathy.  Neurological: She is alert and oriented to person, place, and time.  Psychiatric: She has a normal mood and affect.  Nursing note and vitals reviewed.  BP (!) 124/47   Pulse 68   Temp 98.2 F (36.8 C) (Oral)   Resp 16   Ht 5\' 3"  (1.6 m)   Wt 145 lb 9.6 oz (66 kg)    SpO2 97%   BMI 25.79 kg/m  Wt Readings from Last 3 Encounters:  03/30/17 145 lb 9.6 oz (66 kg)  12/05/16 138 lb 3.2 oz (62.7 kg)  10/25/16 138 lb 9.6 oz (62.9 kg)     Lab Results  Component Value Date   WBC 5.8 12/05/2016   HGB 11.2 (L) 12/05/2016   HCT 31.1 (L) 12/05/2016   PLT 239.0 12/05/2016   GLUCOSE 116 (H) 12/05/2016   CHOL 240 (H) 12/05/2016   TRIG 146.0 12/05/2016   HDL 65.60 12/05/2016   LDLDIRECT 91.0 09/19/2016   LDLCALC 145 (H) 12/05/2016   ALT 14 12/05/2016   AST 15 12/05/2016   NA 138 12/05/2016   K 4.5 12/05/2016   CL 104 12/05/2016   CREATININE 0.89 12/05/2016   BUN 17 12/05/2016   CO2 28 12/05/2016   TSH 5.38 (H) 06/03/2016   INR 0.96 05/28/2012   HGBA1C 6.4 12/05/2016   MICROALBUR <0.7 12/05/2016    Mm Screening Breast Tomo Bilateral  Result Date: 01/12/2017 CLINICAL DATA:  Screening. EXAM: 2D DIGITAL SCREENING BILATERAL MAMMOGRAM WITH CAD AND ADJUNCT TOMO COMPARISON:  Previous exam(s). ACR Breast Density Category b: There are scattered areas of fibroglandular density. FINDINGS: There are no findings suspicious for malignancy. Images were processed with CAD. IMPRESSION: No mammographic evidence of malignancy. A result letter of this screening mammogram will be mailed directly to the patient. RECOMMENDATION: Screening mammogram in one year. (Code:SM-B-01Y) BI-RADS CATEGORY  1: Negative. Electronically Signed   By: Fidela Salisbury M.D.   On: 01/12/2017 16:54     Assessment & Plan:  Plan  I have discontinued Ms. Balcerzak HYDROcodone-acetaminophen. I am also having her start on pregabalin, Zoster Vaccine Adjuvanted, and Azelastine-Fluticasone. Additionally, I am having her maintain her cholecalciferol, polyethylene glycol, docusate sodium, gabapentin, aspirin, Fluticasone-Salmeterol, albuterol, metFORMIN, ONETOUCH DELICA LANCETS FINE, glucose blood, omeprazole, Ca Phosphate-Cholecalciferol, MYRBETRIQ, Multiple Vitamins-Minerals (OCUVITE EXTRA PO),  multivitamin with minerals, methocarbamol, hydrochlorothiazide, alendronate, and celecoxib.  Meds ordered this encounter  Medications  . pregabalin (LYRICA) 75 MG capsule    Sig: Take 1 capsule (75 mg total) by mouth 2 (two) times daily.    Dispense:  60 capsule    Refill:  3  . Zoster Vaccine Adjuvanted [facility]) injection    Sig: Inject 0.5 mLs into the muscle once. Repeat in 2-6 months    Dispense:  0.5 mL    Refill:  1  . Azelastine-Fluticasone 137-50 MCG/ACT SUSP    Sig: 2 sprays each nostril bid    Dispense:  1 Bottle    Refill:  5    Problem List Items Addressed This Visit      Unprioritized   Neuropathic pain - Primary    Pt is requesting to switch to lyrica D/c neurontin and change to lyrica      Relevant Medications   pregabalin (LYRICA) 75 MG capsule   Seasonal allergies   Relevant Medications   Azelastine-Fluticasone 137-50 MCG/ACT SUSP   Seasonal and perennial allergic rhinitis    con't allegra  Restart dymista  rto prn       Other Visit Diagnoses    Need for shingles vaccine       Relevant Medications   Zoster Vaccine Adjuvanted [facility]) injection      Follow-up: Return if symptoms worsen or fail to improve.  [name], DO

## 2017-03-30 NOTE — Assessment & Plan Note (Signed)
Pt is requesting to switch to lyrica D/c neurontin and change to lyrica

## 2017-03-30 NOTE — Telephone Encounter (Signed)
Cologuard results from 01/25/2017: Negative. Results forwarded to PCP.

## 2017-04-03 DIAGNOSIS — L821 Other seborrheic keratosis: Secondary | ICD-10-CM | POA: Diagnosis not present

## 2017-04-03 DIAGNOSIS — D225 Melanocytic nevi of trunk: Secondary | ICD-10-CM | POA: Diagnosis not present

## 2017-04-03 DIAGNOSIS — L570 Actinic keratosis: Secondary | ICD-10-CM | POA: Diagnosis not present

## 2017-04-03 DIAGNOSIS — L565 Disseminated superficial actinic porokeratosis (DSAP): Secondary | ICD-10-CM | POA: Diagnosis not present

## 2017-04-06 DIAGNOSIS — M5416 Radiculopathy, lumbar region: Secondary | ICD-10-CM | POA: Diagnosis not present

## 2017-04-07 ENCOUNTER — Encounter: Payer: Self-pay | Admitting: *Deleted

## 2017-04-12 DIAGNOSIS — M25562 Pain in left knee: Secondary | ICD-10-CM | POA: Diagnosis not present

## 2017-04-17 DIAGNOSIS — M25562 Pain in left knee: Secondary | ICD-10-CM | POA: Diagnosis not present

## 2017-04-19 ENCOUNTER — Encounter: Payer: Self-pay | Admitting: Family Medicine

## 2017-04-19 ENCOUNTER — Other Ambulatory Visit: Payer: Self-pay | Admitting: Family Medicine

## 2017-04-24 NOTE — Telephone Encounter (Signed)
Refill sent in to pharmacy 

## 2017-04-25 DIAGNOSIS — M25562 Pain in left knee: Secondary | ICD-10-CM | POA: Diagnosis not present

## 2017-04-27 DIAGNOSIS — M25562 Pain in left knee: Secondary | ICD-10-CM | POA: Diagnosis not present

## 2017-05-02 DIAGNOSIS — M25562 Pain in left knee: Secondary | ICD-10-CM | POA: Diagnosis not present

## 2017-05-04 DIAGNOSIS — M25562 Pain in left knee: Secondary | ICD-10-CM | POA: Diagnosis not present

## 2017-05-05 ENCOUNTER — Other Ambulatory Visit: Payer: Self-pay | Admitting: Internal Medicine

## 2017-05-09 DIAGNOSIS — M25562 Pain in left knee: Secondary | ICD-10-CM | POA: Diagnosis not present

## 2017-05-11 DIAGNOSIS — M25562 Pain in left knee: Secondary | ICD-10-CM | POA: Diagnosis not present

## 2017-05-16 DIAGNOSIS — M25562 Pain in left knee: Secondary | ICD-10-CM | POA: Diagnosis not present

## 2017-05-18 DIAGNOSIS — M25562 Pain in left knee: Secondary | ICD-10-CM | POA: Diagnosis not present

## 2017-05-25 ENCOUNTER — Ambulatory Visit: Payer: Medicare Other | Admitting: Internal Medicine

## 2017-05-25 ENCOUNTER — Encounter: Payer: Self-pay | Admitting: Internal Medicine

## 2017-05-25 VITALS — BP 122/74 | HR 61 | Ht 63.5 in | Wt 152.4 lb

## 2017-05-25 DIAGNOSIS — J45901 Unspecified asthma with (acute) exacerbation: Secondary | ICD-10-CM | POA: Diagnosis not present

## 2017-05-25 DIAGNOSIS — Z794 Long term (current) use of insulin: Secondary | ICD-10-CM

## 2017-05-25 DIAGNOSIS — E118 Type 2 diabetes mellitus with unspecified complications: Secondary | ICD-10-CM

## 2017-05-25 DIAGNOSIS — M25562 Pain in left knee: Secondary | ICD-10-CM | POA: Diagnosis not present

## 2017-05-25 DIAGNOSIS — J4521 Mild intermittent asthma with (acute) exacerbation: Secondary | ICD-10-CM

## 2017-05-25 MED ORDER — FLUTICASONE-SALMETEROL 250-50 MCG/DOSE IN AEPB: INHALATION_SPRAY | RESPIRATORY_TRACT | 3 refills | Status: DC

## 2017-05-25 MED ORDER — METHYLPREDNISOLONE ACETATE 80 MG/ML IJ SUSP
80.0000 mg | Freq: Once | INTRAMUSCULAR | Status: AC
  Administered 2017-05-25: 80 mg via INTRAMUSCULAR

## 2017-05-25 MED ORDER — LEVALBUTEROL HCL 0.63 MG/3ML IN NEBU
0.6300 mg | INHALATION_SOLUTION | Freq: Once | RESPIRATORY_TRACT | Status: AC
  Administered 2017-05-25: 0.63 mg via RESPIRATORY_TRACT

## 2017-05-25 NOTE — Progress Notes (Signed)
HPI female never smoker followed for allergic rhinitis, asthma, complicated by history embolic CVA complicating C-spine surgery, DM 2  ---------------------------------------------------------------------  05/25/2016-76 year old female never smoker followed for allergic rhinitis, asthma, complicated by history embolic CVA complicating C-spine surgery, DM 2 Follow: per pt hacky cough with no mucus production with nasal drainage  Admits she has wheezed a little occasionally but rescue inhaler lasts all year. Continues Advair. Mild postnasal drip without headache, purulent discharge or active infection. Discussed antihistamines. Pending scoliosis spine surgery or this month  05/25/17-76 year old female never smoker followed for allergic rhinitis, asthma, complicated by history embolic CVA complicating C-spine surgery, DM 2 ---Asthma; Pt states she is doing pretty good overall; had slight wheezing recently and used rescue inhaler. Advair 250, ProAir Mild increased wheeze in the holiday season, increasing rescue inhaler use to about twice daily.  No obvious infection and no sleep disturbance.  Had spine surgery in March and mobility is still limited-rolling walker.  ROV-see HPI + = positive Constitutional:   No-   weight loss, night sweats, fevers, chills, fatigue, lassitude. HEENT:   No-  headaches, difficulty swallowing, tooth/dental problems, sore throat,       No- sneezing, itching, ear ache, +nasal congestion, post nasal drip,  CV:  chest pain, no-orthopnea, PND, swelling in lower extremities, anasarca, dizziness, palpitations Resp: No-   shortness of breath with exertion or at rest.              No-   productive cough,  No non-productive cough,  No- coughing up of blood.              No-   change in color of mucus.  + Wheezing.   Skin: No-   rash or lesions. GI:  GU:  MS:  No-   joint pain or swelling.  No- decreased range of motion.  Back pain after surgery Neuro-     nothing  unusual Psych:  No- change in mood or affect. No depression or anxiety.  No memory loss.  OBJ General- Alert, Oriented, Affect-appropriate, Distress- none acute, looks very well Skin- rash-none, lesions- none, excoriation- none Lymphadenopathy- none Head- atraumatic            Eyes- Gross vision intact, PERRLA, conjunctivae clear secretions            Ears- Hearing, canals-normal            Nose- Clear, no-Septal dev, mucus, polyps, erosion, perforation             Throat- Mallampati II , mucosa clear , drainage- none, tonsils- atrophic,  Neck- flexible , trachea midline, no stridor , thyroid nl, carotid no bruit Chest - symmetrical excursion , unlabored           Heart/CV- RRR , no murmur , no gallop  , no rub, nl s1 s2                           - JVD- none , edema- none, stasis changes- none, varices- none           Lung-  wheeze + trace unlabored, cough-none , dullness-none, rub- none           Chest wall-  Abd-  Br/ Gen/ Rectal- Not done, not indicated Extrem- cyanosis- none, clubbing, none, atrophy- none, strength- nl, + rolling walker Neuro- grossly intact to observation

## 2017-05-25 NOTE — Patient Instructions (Signed)
Refill script printed for Advair  Order- neb xop 0.63     Dx exacerbation asthma              Depo 80  Please call if I can help

## 2017-05-27 NOTE — Assessment & Plan Note (Signed)
Slight exacerbation, nonspecific but possibly related either to viral infection or weather changes Plan-nebulizer treatments Xopenex, Depo-Medrol

## 2017-05-27 NOTE — Assessment & Plan Note (Signed)
We discussed risk benefit of Depo-Medrol with expectation that it will raise her blood sugar temporarily.  She agrees to go ahead.

## 2017-06-01 DIAGNOSIS — M25562 Pain in left knee: Secondary | ICD-10-CM | POA: Diagnosis not present

## 2017-06-06 DIAGNOSIS — M25562 Pain in left knee: Secondary | ICD-10-CM | POA: Diagnosis not present

## 2017-06-07 DIAGNOSIS — M4317 Spondylolisthesis, lumbosacral region: Secondary | ICD-10-CM | POA: Diagnosis not present

## 2017-06-07 DIAGNOSIS — M4120 Other idiopathic scoliosis, site unspecified: Secondary | ICD-10-CM | POA: Diagnosis not present

## 2017-06-07 DIAGNOSIS — R030 Elevated blood-pressure reading, without diagnosis of hypertension: Secondary | ICD-10-CM | POA: Diagnosis not present

## 2017-06-07 DIAGNOSIS — M48062 Spinal stenosis, lumbar region with neurogenic claudication: Secondary | ICD-10-CM | POA: Diagnosis not present

## 2017-06-07 DIAGNOSIS — M5416 Radiculopathy, lumbar region: Secondary | ICD-10-CM | POA: Diagnosis not present

## 2017-06-08 ENCOUNTER — Ambulatory Visit (INDEPENDENT_AMBULATORY_CARE_PROVIDER_SITE_OTHER): Payer: Medicare Other | Admitting: Family Medicine

## 2017-06-08 ENCOUNTER — Encounter: Payer: Self-pay | Admitting: Family Medicine

## 2017-06-08 ENCOUNTER — Telehealth: Payer: Self-pay | Admitting: Family Medicine

## 2017-06-08 VITALS — BP 128/60 | HR 65 | Temp 97.7°F | Resp 16 | Ht 64.0 in | Wt 150.4 lb

## 2017-06-08 DIAGNOSIS — E118 Type 2 diabetes mellitus with unspecified complications: Secondary | ICD-10-CM | POA: Diagnosis not present

## 2017-06-08 DIAGNOSIS — E782 Mixed hyperlipidemia: Secondary | ICD-10-CM | POA: Diagnosis not present

## 2017-06-08 DIAGNOSIS — E1140 Type 2 diabetes mellitus with diabetic neuropathy, unspecified: Secondary | ICD-10-CM

## 2017-06-08 DIAGNOSIS — E1151 Type 2 diabetes mellitus with diabetic peripheral angiopathy without gangrene: Secondary | ICD-10-CM | POA: Diagnosis not present

## 2017-06-08 LAB — COMPREHENSIVE METABOLIC PANEL
ALT: 16 U/L (ref 0–35)
AST: 18 U/L (ref 0–37)
Albumin: 4.1 g/dL (ref 3.5–5.2)
Alkaline Phosphatase: 56 U/L (ref 39–117)
BILIRUBIN TOTAL: 0.5 mg/dL (ref 0.2–1.2)
BUN: 26 mg/dL — ABNORMAL HIGH (ref 6–23)
CO2: 27 mEq/L (ref 19–32)
Calcium: 9.7 mg/dL (ref 8.4–10.5)
Chloride: 107 mEq/L (ref 96–112)
Creatinine, Ser: 0.85 mg/dL (ref 0.40–1.20)
GFR: 69.05 mL/min (ref >60.00)
Glucose, Bld: 138 mg/dL — ABNORMAL HIGH (ref 70–99)
Potassium: 4.4 mEq/L (ref 3.5–5.1)
SODIUM: 140 meq/L (ref 135–145)
Total Protein: 7.4 g/dL (ref 6.0–8.3)

## 2017-06-08 LAB — LIPID PANEL
Cholesterol: 221 mg/dL — ABNORMAL HIGH (ref 0–200)
HDL: 64.9 mg/dL (ref >39.00)
LDL Cholesterol: 134 mg/dL — ABNORMAL HIGH (ref 0–99)
NONHDL: 156.54
Total CHOL/HDL Ratio: 3
Triglycerides: 115 mg/dL (ref 0.0–149.0)
VLDL: 23 mg/dL (ref 0.0–40.0)

## 2017-06-08 LAB — HEMOGLOBIN A1C: Hgb A1c MFr Bld: 6.6 % — ABNORMAL HIGH (ref 4.6–6.5)

## 2017-06-08 MED ORDER — ONETOUCH DELICA LANCETS FINE MISC: 1 refills | Status: DC

## 2017-06-08 MED ORDER — PREGABALIN 100 MG PO CAPS: 100.0000 mg | ORAL_CAPSULE | Freq: Two times a day (BID) | ORAL | 3 refills | Status: DC

## 2017-06-08 MED ORDER — GLUCOSE BLOOD VI STRP: ORAL_STRIP | 1 refills | Status: DC

## 2017-06-08 NOTE — Assessment & Plan Note (Signed)
Controlled.  Check labs  °

## 2017-06-08 NOTE — Telephone Encounter (Signed)
Pt would like for you to call her regarding her appt on 12/06/17. She wants her wellness visit on the same day. I tried to change it but I was getting errors so I decided it would be better if you could get her scheduled. Thanks.

## 2017-06-08 NOTE — Patient Instructions (Signed)

## 2017-06-08 NOTE — Assessment & Plan Note (Signed)
hgba1c to be checked minimize simple carbs. Increase exercise as tolerated. Continue current meds 

## 2017-06-08 NOTE — Assessment & Plan Note (Signed)
Encouraged heart healthy diet, increase exercise, avoid trans fats, consider a krill oil cap daily 

## 2017-06-08 NOTE — Progress Notes (Signed)
Patient ID: REMIE MATHISON, female   DOB: 12-30-1940, 77 y.o.   MRN: [id number]    Subjective:  I acted as a Education administrator for Dr. Carollee Herter.  [name], Opal   Patient ID: [name], female    DOB: 05/11/1941, 77 y.o.   MRN: [id number]  Chief Complaint  Patient presents with  . Hypertension  . Hyperlipidemia  . Diabetes    HPI  Patient is in today for follow up diabetes, blood pressure, and cholesterol.  She has stated that her blood sugars are running low but her weight is going up Patient Care Team: Carollee Herter, Alferd Apa, DO as PCP - General (Family Medicine) Deneise Lever, MD as Consulting Physician (Pulmonary Disease) Erline Levine, MD as Consulting Physician (Neurosurgery) Marlou Sa, Tonna Corner, MD as Consulting Physician (Orthopedic Surgery) Mcarthur Rossetti, MD as Consulting Physician (Orthopedic Surgery) Danella Sensing, MD as Consulting Physician (Dermatology)   Past Medical History:  Diagnosis Date  . Anemia   . Arthritis   . Asthma    adult onset  . Basal cell cancer    LUE; Porokeratosis also  . Chronic kidney disease 1963   strep in kidney due to strep throat-hospitalized 10 days  . Complication of anesthesia    small trachea  . Diabetes mellitus 2010   A1c 6.7%  . DVT (deep venous thrombosis) ([place]) 2006   post immobilization post cns surgery  . GERD (gastroesophageal reflux disease)    very mild  . Granulomatous lung disease ([place]) 2002   incidental Xray finding  . Hyperlipidemia   . Hypertension 2004   Hypertensive response on Stress Test  . Paralysis ([place]) 2006   post cervical fusion with spinal sac tear  with hematoma   . PTE (pulmonary thromboembolism) ([place]) 2006    Past Surgical History:  Procedure Laterality Date  . ABDOMINAL EXPOSURE N/A 08/12/2016   Procedure: ABDOMINAL EXPOSURE;  Surgeon: Rosetta Posner, MD;  Location: [facility];  Service: Vascular;  Laterality: N/A;  . ANTERIOR LAT LUMBAR FUSION N/A 08/12/2016   Procedure: LUMBAR TWO-THREE, LUMBAR  THREE-FOUR, LUMBAR FOUR-FIVE  ANTEROLATERAL LUMBAR INTERBODY FUSION;  Surgeon: Erline Levine, MD;  Location: [facility];  Service: Neurosurgery;  Laterality: N/A;  L2-3 L3-4 L4-5 Anterolateral lumbar interbody fusion  . ANTERIOR LUMBAR FUSION N/A 08/12/2016   Procedure: Lumbar Five-Sacral One Anterior lumbar interbody fusion with Dr. Sherren Mocha Early to assist;  Surgeon: Erline Levine, MD;  Location: [facility];  Service: Neurosurgery;  Laterality: N/A;  L5-S1 Anterior lumbar interbody fusion with Dr. Sherren Mocha Early to assist  . BUNIONECTOMY    . CERVICAL FUSION  2006   Dr Annette Stable, NS;post op hematoma & cns leak & urinary retention  . COLONOSCOPY  1992 & 2002   negative  . epidural steroids  2006   cervical spine  . LUMBAR PERCUTANEOUS PEDICLE SCREW 4 LEVEL N/A 08/12/2016   Procedure: LUMBAR TWO-SACRAL ONE Percuataneous Pedicle Screws;  Surgeon: Erline Levine, MD;  Location: [facility];  Service: Neurosurgery;  Laterality: N/A;  . ROTATOR CUFF REPAIR  2009   Right  . SEPTOPLASTY    . TONSILLECTOMY  77 years old  . TOTAL HIP ARTHROPLASTY  06/01/2012   Procedure: TOTAL HIP ARTHROPLASTY ANTERIOR APPROACH;  Surgeon: Mcarthur Rossetti, MD;  Location[facility];  Service: Orthopedics;  Laterality: Left;  Left Total Hip Arthroplasty  . TUBAL LIGATION      Family History  Problem Relation Age of Onset  . COPD Father  emphysema  . Cancer Mother        cns cancer  . Diabetes Sister        TWIN sister ; also Fibromyalgia ; S/P stent 2004  . Heart disease Sister        stents @ 45 & 25  . Stroke Maternal Grandmother        in  late 32s  . Transient ischemic attack Paternal Aunt     Social History   Socioeconomic History  . Marital status: Divorced    Spouse name: Not on file  . Number of children: Not on file  . Years of education: Not on file  . Highest education level: Not on file  Social Needs  . Financial resource strain: Not on file  . Food insecurity - worry: Not on file  . Food insecurity - inability:  Not on file  . Transportation needs - medical: Not on file  . Transportation needs - non-medical: Not on file  Occupational History  . Not on file  Tobacco Use  . Smoking status: Never Smoker  . Smokeless tobacco: Never Used  Substance and Sexual Activity  . Alcohol use: Yes    Alcohol/week: 0.0 oz    Comment: twice a week; 2-4/week  . Drug use: No  . Sexual activity: No  Other Topics Concern  . Not on file  Social History Narrative  . Not on file    Outpatient Medications Prior to Visit  Medication Sig Dispense Refill  . albuterol (PROAIR HFA) 108 (90 Base) MCG/ACT inhaler Inhale 2 puffs into the lungs every 6 (six) hours as needed for wheezing or shortness of breath. 3 Inhaler 3  . alendronate (FOSAMAX) 70 MG tablet TAKE 1 TABLET(70 MG) BY MOUTH EVERY 7 DAYS WITH A FULL GLASS OF WATER AND ON AN EMPTY STOMACH 12 tablet 0  . aspirin 81 MG tablet Take 81 mg by mouth daily.    . Azelastine-Fluticasone 137-50 MCG/ACT SUSP 2 sprays each nostril bid 1 Bottle 5  . Ca Phosphate-Cholecalciferol (CALCIUM 500 + D3) 250-500 MG-UNIT CHEW Chew by mouth.    . celecoxib (CELEBREX) 200 MG capsule TAKE 1 CAPSULE(200 MG) BY MOUTH DAILY 90 capsule 1  . cholecalciferol (VITAMIN D) 1000 UNITS tablet Take 1,000 Units by mouth daily.     . Fluticasone-Salmeterol (ADVAIR DISKUS) 250-50 MCG/DOSE AEPB USE 1 INHALATION TWO TIMES  DAILY ; RINSE MOUTH 180 each 3  . hydrochlorothiazide (MICROZIDE) 12.5 MG capsule Take 1 capsule (12.5 mg total) by mouth daily. 30 capsule 0  . metFORMIN (GLUCOPHAGE-XR) 500 MG 24 hr tablet Take 1 tablet (500 mg total) by mouth daily with breakfast. 90 tablet 6  . methocarbamol (ROBAXIN) 500 MG tablet Take 1 tablet by mouth 3 (three) times daily.  0  . Multiple Vitamin (MULTIVITAMIN WITH MINERALS) TABS tablet Take 1 tablet by mouth daily.    . Multiple Vitamins-Minerals (OCUVITE EXTRA PO) Take 1 tablet by mouth daily.    [name] MYRBETRIQ 50 MG TB24 tablet TAKE 1 TABLET(50 MG) BY MOUTH  DAILY 30 tablet 5  . omeprazole (PRILOSEC) 20 MG capsule Take 20 mg by mouth daily as needed.    [name] glucose blood (ONETOUCH VERIO) test strip Check blood sugar twice daily 200 each 3  . ONETOUCH DELICA LANCETS FINE MISC Check blood sugar twice daily 200 each 3  . pregabalin (LYRICA) 75 MG capsule Take 1 capsule (75 mg total) by mouth 2 (two) times daily. 60 capsule 3   No  facility-administered medications prior to visit.     Allergies  Allergen Reactions  . Statins Other (See Comments)    Myalgias and muscle weakness    Review of Systems  Constitutional: Negative for chills, fever and malaise/fatigue.  HENT: Negative for congestion and hearing loss.   Eyes: Negative for discharge.  Respiratory: Negative for cough, sputum production and shortness of breath.   Cardiovascular: Negative for chest pain, palpitations and leg swelling.  Gastrointestinal: Negative for abdominal pain, blood in stool, constipation, diarrhea, heartburn, nausea and vomiting.  Genitourinary: Negative for dysuria, frequency, hematuria and urgency.  Musculoskeletal: Negative for back pain, falls and myalgias.  Skin: Negative for rash.  Neurological: Negative for dizziness, sensory change, loss of consciousness, weakness and headaches.  Endo/Heme/Allergies: Negative for environmental allergies. Does not bruise/bleed easily.  Psychiatric/Behavioral: Negative for depression and suicidal ideas. The patient is not nervous/anxious and does not have insomnia.        Objective:    Physical Exam  Constitutional: She is oriented to person, place, and time. She appears well-developed and well-nourished.  HENT:  Head: Normocephalic and atraumatic.  Eyes: Conjunctivae and EOM are normal.  Neck: Normal range of motion. Neck supple. No JVD present. Carotid bruit is not present. No thyromegaly present.  Cardiovascular: Normal rate, regular rhythm and normal heart sounds.  No murmur heard. Pulmonary/Chest: Effort normal and  breath sounds normal. No respiratory distress. She has no wheezes. She has no rales. She exhibits no tenderness.  Musculoskeletal: She exhibits no edema.  Neurological: She is alert and oriented to person, place, and time.  Psychiatric: She has a normal mood and affect.  Nursing note and vitals reviewed. Sensory exam of the foot is normal, tested with the monofilament. Good pulses, no lesions or ulcers, good peripheral pulses.  BP 128/60 (BP Location: Right Arm, Cuff Size: Normal)   Pulse 65   Temp 97.7 F (36.5 C) (Oral)   Resp 16   Ht 5\' 4"  (1.626 m)   Wt 150 lb 6.4 oz (68.2 kg)   SpO2 96%   BMI 25.82 kg/m  Wt Readings from Last 3 Encounters:  06/08/17 150 lb 6.4 oz (68.2 kg)  05/25/17 152 lb 6.4 oz (69.1 kg)  03/30/17 145 lb 9.6 oz (66 kg)   BP Readings from Last 3 Encounters:  06/08/17 128/60  05/25/17 122/74  03/30/17 (!) 124/47     Immunization History  Administered Date(s) Administered  . Influenza Split 03/15/2012  . Influenza Whole 02/27/2009  . Influenza, High Dose Seasonal PF 03/19/2013, 03/21/2016  . Influenza,inj,Quad PF,6+ Mos 03/13/2014, 02/16/2015  . Influenza-Unspecified 03/15/2012, 01/28/2014, 03/13/2014, 02/16/2015, 02/14/2017  . Pneumococcal Conjugate-13 05/16/2013  . Pneumococcal Polysaccharide-23 09/29/2014  . Pneumococcal-Unspecified 05/16/2013  . Tdap 10/28/2010  . Zoster 05/12/2014    Health Maintenance  Topic Date Due  . PNA vac Low Risk Adult (2 of 2 - PCV13) 09/29/2015  . HEMOGLOBIN A1C  06/07/2017  . OPHTHALMOLOGY EXAM  11/21/2017  . URINE MICROALBUMIN  12/05/2017  . MAMMOGRAM  01/12/2018  . FOOT EXAM  06/08/2018  . TETANUS/TDAP  10/27/2020  . INFLUENZA VACCINE  Completed  . DEXA SCAN  Completed    Lab Results  Component Value Date   WBC 5.8 12/05/2016   HGB 11.2 (L) 12/05/2016   HCT 31.1 (L) 12/05/2016   PLT 239.0 12/05/2016   GLUCOSE 116 (H) 12/05/2016   CHOL 240 (H) 12/05/2016   TRIG 146.0 12/05/2016   HDL 65.60 12/05/2016    LDLDIRECT 91.0 09/19/2016  LDLCALC 145 (H) 12/05/2016   ALT 14 12/05/2016   AST 15 12/05/2016   NA 138 12/05/2016   K 4.5 12/05/2016   CL 104 12/05/2016   CREATININE 0.89 12/05/2016   BUN 17 12/05/2016   CO2 28 12/05/2016   TSH 5.38 (H) 06/03/2016   INR 0.96 05/28/2012   HGBA1C 6.4 12/05/2016   MICROALBUR <0.7 12/05/2016    Lab Results  Component Value Date   TSH 5.38 (H) 06/03/2016   Lab Results  Component Value Date   WBC 5.8 12/05/2016   HGB 11.2 (L) 12/05/2016   HCT 31.1 (L) 12/05/2016   MCV 91.2 12/05/2016   PLT 239.0 12/05/2016   Lab Results  Component Value Date   NA 138 12/05/2016   K 4.5 12/05/2016   CO2 28 12/05/2016   GLUCOSE 116 (H) 12/05/2016   BUN 17 12/05/2016   CREATININE 0.89 12/05/2016   BILITOT 0.3 12/05/2016   ALKPHOS 72 12/05/2016   AST 15 12/05/2016   ALT 14 12/05/2016   PROT 7.3 12/05/2016   ALBUMIN 3.9 12/05/2016   CALCIUM 9.8 12/05/2016   ANIONGAP 9 08/03/2016   GFR 65.57 12/05/2016   Lab Results  Component Value Date   CHOL 240 (H) 12/05/2016   Lab Results  Component Value Date   HDL 65.60 12/05/2016   Lab Results  Component Value Date   LDLCALC 145 (H) 12/05/2016   Lab Results  Component Value Date   TRIG 146.0 12/05/2016   Lab Results  Component Value Date   CHOLHDL 4 12/05/2016   Lab Results  Component Value Date   HGBA1C 6.4 12/05/2016         Assessment & Plan:   Problem List Items Addressed This Visit      Unprioritized   Diabetes mellitus with peripheral vascular disease ([place])    Controlled Check labs      HYPERLIPIDEMIA    Encouraged heart healthy diet, increase exercise, avoid trans fats, consider a krill oil cap daily      Type 2 diabetes mellitus with diabetic neuropathy ([place]) - Primary    hgba1c to be checked.  minimize simple carbs. Increase exercise as tolerated. Continue current meds       Relevant Medications   ONETOUCH DELICA LANCETS FINE MISC   glucose blood (ONETOUCH VERIO)  test strip   pregabalin (LYRICA) 100 MG capsule      I have discontinued Shavell E. Oaxaca's pregabalin. I am also having her start on pregabalin. Additionally, I am having her maintain her cholecalciferol, aspirin, albuterol, metFORMIN, omeprazole, Ca Phosphate-Cholecalciferol, MYRBETRIQ, Multiple Vitamins-Minerals (OCUVITE EXTRA PO), multivitamin with minerals, methocarbamol, hydrochlorothiazide, celecoxib, Azelastine-Fluticasone, alendronate, Fluticasone-Salmeterol, ONETOUCH DELICA LANCETS FINE, and glucose blood.  Meds ordered this encounter  Medications  . ONETOUCH DELICA LANCETS FINE MISC    Sig: Check blood sugar twice daily    Dispense:  200 each    Refill:  1  . glucose blood (ONETOUCH VERIO) test strip    Sig: Check blood sugar twice daily    Dispense:  200 each    Refill:  1  . pregabalin (LYRICA) 100 MG capsule    Sig: Take 1 capsule (100 mg total) by mouth 2 (two) times daily.    Dispense:  180 capsule    Refill:  3    CMA served as scribe during this visit. History, Physical and Plan performed by medical provider. Documentation and orders reviewed and attested to.  [name], DO

## 2017-06-09 DIAGNOSIS — M25562 Pain in left knee: Secondary | ICD-10-CM | POA: Diagnosis not present

## 2017-06-13 DIAGNOSIS — M25562 Pain in left knee: Secondary | ICD-10-CM | POA: Diagnosis not present

## 2017-06-15 DIAGNOSIS — M25562 Pain in left knee: Secondary | ICD-10-CM | POA: Diagnosis not present

## 2017-06-16 ENCOUNTER — Other Ambulatory Visit: Payer: Self-pay | Admitting: Emergency Medicine

## 2017-06-16 MED ORDER — ONETOUCH DELICA LANCETS 33G MISC: 1 refills | Status: DC

## 2017-06-20 DIAGNOSIS — M25562 Pain in left knee: Secondary | ICD-10-CM | POA: Diagnosis not present

## 2017-06-22 DIAGNOSIS — M25562 Pain in left knee: Secondary | ICD-10-CM | POA: Diagnosis not present

## 2017-06-28 DIAGNOSIS — M25562 Pain in left knee: Secondary | ICD-10-CM | POA: Diagnosis not present

## 2017-07-05 DIAGNOSIS — H10413 Chronic giant papillary conjunctivitis, bilateral: Secondary | ICD-10-CM | POA: Diagnosis not present

## 2017-07-05 DIAGNOSIS — Z7984 Long term (current) use of oral hypoglycemic drugs: Secondary | ICD-10-CM | POA: Diagnosis not present

## 2017-07-05 DIAGNOSIS — H2513 Age-related nuclear cataract, bilateral: Secondary | ICD-10-CM | POA: Diagnosis not present

## 2017-07-05 DIAGNOSIS — E119 Type 2 diabetes mellitus without complications: Secondary | ICD-10-CM | POA: Diagnosis not present

## 2017-07-05 DIAGNOSIS — H40013 Open angle with borderline findings, low risk, bilateral: Secondary | ICD-10-CM | POA: Diagnosis not present

## 2017-07-06 DIAGNOSIS — M25562 Pain in left knee: Secondary | ICD-10-CM | POA: Diagnosis not present

## 2017-07-23 ENCOUNTER — Other Ambulatory Visit: Payer: Self-pay | Admitting: Family Medicine

## 2017-08-22 ENCOUNTER — Other Ambulatory Visit: Payer: Self-pay | Admitting: Family Medicine

## 2017-08-22 DIAGNOSIS — N3281 Overactive bladder: Secondary | ICD-10-CM

## 2017-08-30 ENCOUNTER — Telehealth: Payer: Self-pay | Admitting: *Deleted

## 2017-08-30 NOTE — Telephone Encounter (Signed)
Copied from [employer]. Topic: Medical Record Request - Patient ROI Request >> Aug 24, 2017  1:40 PM Scherrie Gerlach wrote: Pt states she has decided to go and live at [facility] retirement home. Pt has signed a release with them, and they have sent to your office and hopes to get this information asap. Pt states her move in date is May 31 and they need her records sent to them before they can finalize If you have not received, please call the pt.  Route to [employer] for [employer]. For all other clinics, route to the clinic's PEC Pool.

## 2017-09-01 NOTE — Telephone Encounter (Signed)
See other phone note.  Faxed paper to [facility] on 08/31/17

## 2017-09-07 DIAGNOSIS — M5416 Radiculopathy, lumbar region: Secondary | ICD-10-CM | POA: Diagnosis not present

## 2017-09-26 ENCOUNTER — Other Ambulatory Visit (INDEPENDENT_AMBULATORY_CARE_PROVIDER_SITE_OTHER): Payer: Self-pay | Admitting: Physician Assistant

## 2017-09-26 ENCOUNTER — Other Ambulatory Visit: Payer: Self-pay | Admitting: Family Medicine

## 2017-09-26 NOTE — Telephone Encounter (Signed)
Please advise 

## 2017-10-18 ENCOUNTER — Encounter (INDEPENDENT_AMBULATORY_CARE_PROVIDER_SITE_OTHER): Payer: Self-pay | Admitting: Orthopaedic Surgery

## 2017-10-18 ENCOUNTER — Ambulatory Visit (INDEPENDENT_AMBULATORY_CARE_PROVIDER_SITE_OTHER): Payer: Medicare Other

## 2017-10-18 ENCOUNTER — Ambulatory Visit (INDEPENDENT_AMBULATORY_CARE_PROVIDER_SITE_OTHER): Payer: Medicare Other | Admitting: Orthopaedic Surgery

## 2017-10-18 DIAGNOSIS — M25561 Pain in right knee: Secondary | ICD-10-CM

## 2017-10-18 DIAGNOSIS — G8929 Other chronic pain: Secondary | ICD-10-CM

## 2017-10-18 MED ORDER — LIDOCAINE HCL 1 % IJ SOLN
3.0000 mL | INTRAMUSCULAR | Status: AC | PRN
  Administered 2017-10-18: 3 mL

## 2017-10-18 MED ORDER — METHYLPREDNISOLONE ACETATE 40 MG/ML IJ SUSP
40.0000 mg | INTRAMUSCULAR | Status: AC | PRN
  Administered 2017-10-18: 40 mg via INTRA_ARTICULAR

## 2017-10-18 NOTE — Progress Notes (Signed)
Office Visit Note   Patient: [name]           Date of Birth: 1940-11-01           MRN: [id number] Visit Date: 10/18/2017              Requested by: [address], [place], [place] [address] [place], [place] [address] PCP: Carollee Herter, Alferd Apa, DO   Assessment & Plan: Visit Diagnoses:  1. Chronic pain of right knee     Plan: I do feel it is worth trying a steroid injection in her right knee and explained the rationale behind this as well as the risk minutes injections.  She does wish to try this treatment plan as well.  She tolerated the injection well.  We will see her back in a month to see whether or not we need to obtain an MRI of her knee if she continues to have her mechanical symptoms.  Follow-Up Instructions: Return in about 1 month (around 11/15/2017).   Orders:  Orders Placed This Encounter  Procedures  . Large Joint Inj  . XR Knee 1-2 Views Right   No orders of the defined types were placed in this encounter.     Procedures: Large Joint Inj: R knee on 10/18/2017 4:09 PM Indications: diagnostic evaluation and pain Details: 22 G 1.5 in needle, superolateral approach  Arthrogram: No  Medications: 3 mL lidocaine 1 %; 40 mg methylPREDNISolone acetate 40 MG/ML Outcome: tolerated well, no immediate complications Procedure, treatment alternatives, risks and benefits explained, specific risks discussed. Consent was given by the patient. Immediately prior to procedure a time out was called to verify the correct patient, procedure, equipment, support staff and site/side marked as required. Patient was prepped and draped in the usual sterile fashion.       Clinical Data: No additional findings.   Subjective: Chief Complaint  Patient presents with  . Right Knee - Follow-up  The patient is only seen before.  He has had an acute flareup of right knee pain with popping in her knee.  She is a 77 year old who does ablate with a rolling walker and has  issues with chronic lumbar spine pain with radicular symptoms with previous back surgery as well.  She is been having some locking catching though in her right knee that she has not had before and she feels like this may be getting worse.  She feels unsteady on that knee and feels like the knee will give out on her.  This is again on the right side.  HPI  Review of Systems She denies any headache, chest pain, shortness of breath, fever, chills, nausea, vomiting.  Objective: Vital Signs: There were no vitals taken for this visit.  Physical Exam She is alert and oriented x3 in no acute distress Ortho Exam Examination of her right knee does show just a slight effusion.  She has patellofemoral crepitation but good range of motion overall with global tenderness there is some fullness in the back of her knee. Specialty Comments:  No specialty comments available.  Imaging: Xr Knee 1-2 Views Right  Result Date: 10/18/2017 An AP and lateral of the right knee are compared to previous films show no acute findings.  There is mild medial joint space narrowing and patellofemoral arthritic changes.    PMFS History: Patient Active Problem List   Diagnosis Date Noted  . Type 2 diabetes mellitus with diabetic neuropathy ([place]) 06/08/2017  . Neuropathic  pain 03/30/2017  . Seasonal allergies 03/30/2017  . Lumbar scoliosis 08/12/2016  . Controlled diabetes mellitus type 2 with complications ([place]) 69/62/9528  . Scoliosis 11/24/2015  . Low back pain 09/29/2014  . Diabetes mellitus with peripheral vascular disease ([place]) 04/10/2013  . Left carotid bruit 04/10/2013  . Anemia 03/31/2013  . Degenerative arthritis of hip 06/01/2012  . POSTMENOPAUSAL SYNDROME 09/16/2009  . ARTHRALGIA 09/16/2009  . Seasonal and perennial allergic rhinitis 10/08/2007  . OSTEOPENIA 08/06/2007  . ELEVATED BLOOD PRESSURE WITHOUT DIAGNOSIS OF HYPERTENSION 08/06/2007  . HYPERLIPIDEMIA 05/14/2007  . Asthma, mild intermittent  05/14/2007  . History of pulmonary embolus (PE) 08/31/2006   Past Medical History:  Diagnosis Date  . Anemia   . Arthritis   . Asthma    adult onset  . Basal cell cancer    LUE; Porokeratosis also  . Chronic kidney disease 1963   strep in kidney due to strep throat-hospitalized 10 days  . Complication of anesthesia    small trachea  . Diabetes mellitus 2010   A1c 6.7%  . DVT (deep venous thrombosis) ([place]) 2006   post immobilization post cns surgery  . GERD (gastroesophageal reflux disease)    very mild  . Granulomatous lung disease ([place]) 2002   incidental Xray finding  . Hyperlipidemia   . Hypertension 2004   Hypertensive response on Stress Test  . Paralysis ([place]) 2006   post cervical fusion with spinal sac tear  with hematoma   . PTE (pulmonary thromboembolism) ([place]) 2006    Family History  Problem Relation Age of Onset  . COPD Father        emphysema  . Cancer Mother        cns cancer  . Diabetes Sister        TWIN sister ; also Fibromyalgia ; S/P stent 2004  . Heart disease Sister        stents @ 2 & 74  . Stroke Maternal Grandmother        in  late 23s  . Transient ischemic attack Paternal Aunt     Past Surgical History:  Procedure Laterality Date  . ABDOMINAL EXPOSURE N/A 08/12/2016   Procedure: ABDOMINAL EXPOSURE;  Surgeon: Rosetta Posner, MD;  Location: [facility];  Service: Vascular;  Laterality: N/A;  . ANTERIOR LAT LUMBAR FUSION N/A 08/12/2016   Procedure: LUMBAR TWO-THREE, LUMBAR THREE-FOUR, LUMBAR FOUR-FIVE  ANTEROLATERAL LUMBAR INTERBODY FUSION;  Surgeon: Erline Levine, MD;  Location: [facility];  Service: Neurosurgery;  Laterality: N/A;  L2-3 L3-4 L4-5 Anterolateral lumbar interbody fusion  . ANTERIOR LUMBAR FUSION N/A 08/12/2016   Procedure: Lumbar Five-Sacral One Anterior lumbar interbody fusion with Dr. Sherren Mocha Early to assist;  Surgeon: Erline Levine, MD;  Location: [facility];  Service: Neurosurgery;  Laterality: N/A;  L5-S1 Anterior lumbar interbody fusion with Dr. Sherren Mocha  Early to assist  . BUNIONECTOMY    . CERVICAL FUSION  2006   Dr Annette Stable, NS;post op hematoma & cns leak & urinary retention  . COLONOSCOPY  1992 & 2002   negative  . epidural steroids  2006   cervical spine  . LUMBAR PERCUTANEOUS PEDICLE SCREW 4 LEVEL N/A 08/12/2016   Procedure: LUMBAR TWO-SACRAL ONE Percuataneous Pedicle Screws;  Surgeon: Erline Levine, MD;  Location: [facility];  Service: Neurosurgery;  Laterality: N/A;  . ROTATOR CUFF REPAIR  2009   Right  . SEPTOPLASTY    . TONSILLECTOMY  77 years old  . TOTAL HIP ARTHROPLASTY  06/01/2012   Procedure: TOTAL  HIP ARTHROPLASTY ANTERIOR APPROACH;  Surgeon: Mcarthur Rossetti, MD;  Location[facility];  Service: Orthopedics;  Laterality: Left;  Left Total Hip Arthroplasty  . TUBAL LIGATION     Social History   Occupational History  . Not on file  Tobacco Use  . Smoking status: Never Smoker  . Smokeless tobacco: Never Used  Substance and Sexual Activity  . Alcohol use: Yes    Alcohol/week: 0.0 oz    Comment: twice a week; 2-4/week  . Drug use: No  . Sexual activity: Never

## 2017-10-19 ENCOUNTER — Other Ambulatory Visit: Payer: Self-pay | Admitting: Family Medicine

## 2017-11-02 ENCOUNTER — Encounter: Payer: Self-pay | Admitting: Family Medicine

## 2017-11-02 ENCOUNTER — Ambulatory Visit (INDEPENDENT_AMBULATORY_CARE_PROVIDER_SITE_OTHER): Payer: Medicare Other | Admitting: Family Medicine

## 2017-11-02 VITALS — BP 136/64 | HR 64 | Temp 98.2°F | Resp 16 | Ht 64.0 in | Wt 155.6 lb

## 2017-11-02 DIAGNOSIS — M79604 Pain in right leg: Secondary | ICD-10-CM

## 2017-11-02 NOTE — Progress Notes (Signed)
Patient ID: [name], female   DOB: Aug 11, 1940, 77 y.o.   MRN: [id number]     Subjective:  I acted as a Education administrator for Dr. Carollee Herter.  [name], [name]   Patient ID: [name], female    DOB: Jul 30, 1940, 77 y.o.   MRN: [id number]  Chief Complaint  Patient presents with  . wants to talk about nerves in leg    HPI    Patient is in today for nerve pain  in right leg.  She is taking lyrica but it wears off before bed time.  She takes 100 mg bid. Patient Care Team: Carollee Herter, Alferd Apa, DO as PCP - General (Family Medicine) Deneise Lever, MD as Consulting Physician (Pulmonary Disease) Erline Levine, MD as Consulting Physician (Neurosurgery) Marlou Sa, Tonna Corner, MD as Consulting Physician (Orthopedic Surgery) Mcarthur Rossetti, MD as Consulting Physician (Orthopedic Surgery) Danella Sensing, MD as Consulting Physician (Dermatology)   Past Medical History:  Diagnosis Date  . Anemia   . Arthritis   . Asthma    adult onset  . Basal cell cancer    LUE; Porokeratosis also  . Chronic kidney disease 1963   strep in kidney due to strep throat-hospitalized 10 days  . Complication of anesthesia    small trachea  . Diabetes mellitus 2010   A1c 6.7%  . DVT (deep venous thrombosis) ([place]) 2006   post immobilization post cns surgery  . GERD (gastroesophageal reflux disease)    very mild  . Granulomatous lung disease ([place]) 2002   incidental Xray finding  . Hyperlipidemia   . Hypertension 2004   Hypertensive response on Stress Test  . Paralysis ([place]) 2006   post cervical fusion with spinal sac tear  with hematoma   . PTE (pulmonary thromboembolism) ([place]) 2006    Past Surgical History:  Procedure Laterality Date  . ABDOMINAL EXPOSURE N/A 08/12/2016   Procedure: ABDOMINAL EXPOSURE;  Surgeon: Rosetta Posner, MD;  Location: [facility];  Service: Vascular;  Laterality: N/A;  . ANTERIOR LAT LUMBAR FUSION N/A 08/12/2016   Procedure: LUMBAR TWO-THREE, LUMBAR THREE-FOUR, LUMBAR FOUR-FIVE   ANTEROLATERAL LUMBAR INTERBODY FUSION;  Surgeon: Erline Levine, MD;  Location: [facility];  Service: Neurosurgery;  Laterality: N/A;  L2-3 L3-4 L4-5 Anterolateral lumbar interbody fusion  . ANTERIOR LUMBAR FUSION N/A 08/12/2016   Procedure: Lumbar Five-Sacral One Anterior lumbar interbody fusion with Dr. Sherren Mocha Early to assist;  Surgeon: Erline Levine, MD;  Location: [facility];  Service: Neurosurgery;  Laterality: N/A;  L5-S1 Anterior lumbar interbody fusion with Dr. Sherren Mocha Early to assist  . BUNIONECTOMY    . CERVICAL FUSION  2006   Dr Annette Stable, NS;post op hematoma & cns leak & urinary retention  . COLONOSCOPY  1992 & 2002   negative  . epidural steroids  2006   cervical spine  . LUMBAR PERCUTANEOUS PEDICLE SCREW 4 LEVEL N/A 08/12/2016   Procedure: LUMBAR TWO-SACRAL ONE Percuataneous Pedicle Screws;  Surgeon: Erline Levine, MD;  Location: [facility];  Service: Neurosurgery;  Laterality: N/A;  . ROTATOR CUFF REPAIR  2009   Right  . SEPTOPLASTY    . TONSILLECTOMY  77 years old  . TOTAL HIP ARTHROPLASTY  06/01/2012   Procedure: TOTAL HIP ARTHROPLASTY ANTERIOR APPROACH;  Surgeon: Mcarthur Rossetti, MD;  Location: [facility];  Service: Orthopedics;  Laterality: Left;  Left Total Hip Arthroplasty  . TUBAL LIGATION      Family History  Problem Relation Age of Onset  . COPD Father  emphysema  . Cancer Mother        cns cancer  . Diabetes Sister        TWIN sister ; also Fibromyalgia ; S/P stent 2004  . Heart disease Sister        stents @ 26 & 22  . Stroke Maternal Grandmother        in  late 44s  . Transient ischemic attack Paternal Aunt     Social History   Socioeconomic History  . Marital status: Divorced    Spouse name: Not on file  . Number of children: Not on file  . Years of education: Not on file  . Highest education level: Not on file  Occupational History  . Not on file  Social Needs  . Financial resource strain: Not on file  . Food insecurity:    Worry: Not on file    Inability: Not on  file  . Transportation needs:    Medical: Not on file    Non-medical: Not on file  Tobacco Use  . Smoking status: Never Smoker  . Smokeless tobacco: Never Used  Substance and Sexual Activity  . Alcohol use: Yes    Alcohol/week: 0.0 oz    Comment: twice a week; 2-4/week  . Drug use: No  . Sexual activity: Never  Lifestyle  . Physical activity:    Days per week: Not on file    Minutes per session: Not on file  . Stress: Not on file  Relationships  . Social connections:    Talks on phone: Not on file    Gets together: Not on file    Attends religious service: Not on file    Active member of club or organization: Not on file    Attends meetings of clubs or organizations: Not on file    Relationship status: Not on file  . Intimate partner violence:    Fear of current or ex partner: Not on file    Emotionally abused: Not on file    Physically abused: Not on file    Forced sexual activity: Not on file  Other Topics Concern  . Not on file  Social History Narrative  . Not on file    Outpatient Medications Prior to Visit  Medication Sig Dispense Refill  . alendronate (FOSAMAX) 70 MG tablet TAKE 1 TABLET(70 MG) BY MOUTH EVERY 7 DAYS WITH A FULL GLASS OF WATER AND ON AN EMPTY STOMACH 12 tablet 0  . aspirin 81 MG tablet Take 81 mg by mouth daily.    . Azelastine-Fluticasone 137-50 MCG/ACT SUSP 2 sprays each nostril bid 1 Bottle 5  . Ca Phosphate-Cholecalciferol (CALCIUM 500 + D3) 250-500 MG-UNIT CHEW Chew by mouth.    . celecoxib (CELEBREX) 200 MG capsule TAKE 1 CAPSULE(200 MG) BY MOUTH DAILY 90 capsule 0  . cholecalciferol (VITAMIN D) 1000 UNITS tablet Take 1,000 Units by mouth daily.     . Fluticasone-Salmeterol (ADVAIR DISKUS) 250-50 MCG/DOSE AEPB USE 1 INHALATION TWO TIMES  DAILY ; RINSE MOUTH 180 each 3  . glucose blood (ONETOUCH VERIO) test strip Check blood sugar twice daily 200 each 1  . metFORMIN (GLUCOPHAGE-XR) 500 MG 24 hr tablet TAKE 1 TABLET(500 MG) BY MOUTH DAILY WITH  BREAKFAST 90 tablet 0  . methocarbamol (ROBAXIN) 500 MG tablet Take 1 tablet by mouth 3 (three) times daily.  0  . Multiple Vitamin (MULTIVITAMIN WITH MINERALS) TABS tablet Take 1 tablet by mouth daily.    . Multiple Vitamins-Minerals (OCUVITE  EXTRA PO) Take 1 tablet by mouth daily.    [name] MYRBETRIQ 50 MG TB24 tablet TAKE 1 TABLET(50 MG) BY MOUTH DAILY 30 tablet 0  . omeprazole (PRILOSEC) 20 MG capsule Take 20 mg by mouth daily as needed.    Glory Rosebush DELICA LANCETS 16X MISC Check blood sugar twice daily 200 each 1  . pregabalin (LYRICA) 100 MG capsule Take 1 capsule (100 mg total) by mouth 2 (two) times daily. 180 capsule 3  . traMADol (ULTRAM) 50 MG tablet Take 1 tablet by mouth every 6 (six) hours as needed.  0  . albuterol (PROAIR HFA) 108 (90 Base) MCG/ACT inhaler Inhale 2 puffs into the lungs every 6 (six) hours as needed for wheezing or shortness of breath. 3 Inhaler 3  . hydrochlorothiazide (MICROZIDE) 12.5 MG capsule Take 1 capsule (12.5 mg total) by mouth daily. 30 capsule 0  . ONETOUCH DELICA LANCETS FINE MISC Check blood sugar twice daily 200 each 1   No facility-administered medications prior to visit.     Allergies  Allergen Reactions  . Statins Other (See Comments)    Myalgias and muscle weakness    Review of Systems  Constitutional: Negative for fever and malaise/fatigue.  HENT: Negative for congestion.   Eyes: Negative for blurred vision.  Respiratory: Negative for cough and shortness of breath.   Cardiovascular: Negative for chest pain, palpitations and leg swelling.  Gastrointestinal: Negative for vomiting.  Musculoskeletal: Negative for back pain.  Skin: Negative for rash.  Neurological: Negative for loss of consciousness and headaches.       Objective:    Physical Exam  Constitutional: She is oriented to person, place, and time. She appears well-developed and well-nourished.  HENT:  Head: Normocephalic and atraumatic.  Eyes: Conjunctivae and EOM are  normal.  Neck: Normal range of motion. Neck supple. No JVD present. Carotid bruit is not present. No thyromegaly present.  Cardiovascular: Normal rate, regular rhythm and normal heart sounds.  No murmur heard. Pulmonary/Chest: Effort normal and breath sounds normal. No respiratory distress. She has no wheezes. She has no rales. She exhibits no tenderness.  Musculoskeletal: She exhibits tenderness. She exhibits no edema.       Right upper leg: She exhibits tenderness.       Legs: Neurological: She is alert and oriented to person, place, and time.  Psychiatric: She has a normal mood and affect.  Nursing note and vitals reviewed.   BP 136/64 (BP Location: Right Arm, Cuff Size: Normal)   Pulse 64   Temp 98.2 F (36.8 C) (Oral)   Resp 16   Ht 5\' 4"  (1.626 m)   Wt 155 lb 9.6 oz (70.6 kg)   SpO2 97%   BMI 26.71 kg/m  Wt Readings from Last 3 Encounters:  11/02/17 155 lb 9.6 oz (70.6 kg)  06/08/17 150 lb 6.4 oz (68.2 kg)  05/25/17 152 lb 6.4 oz (69.1 kg)   BP Readings from Last 3 Encounters:  11/02/17 136/64  06/08/17 128/60  05/25/17 122/74     Immunization History  Administered Date(s) Administered  . Influenza Split 03/15/2012  . Influenza Whole 02/27/2009  . Influenza, High Dose Seasonal PF 03/19/2013, 03/21/2016  . Influenza,inj,Quad PF,6+ Mos 03/13/2014, 02/16/2015  . Influenza-Unspecified 03/15/2012, 01/28/2014, 03/13/2014, 02/16/2015, 02/14/2017  . Pneumococcal Conjugate-13 05/16/2013  . Pneumococcal Polysaccharide-23 09/29/2014  . Pneumococcal-Unspecified 05/16/2013  . Tdap 10/28/2010  . Zoster 05/12/2014    Health Maintenance  Topic Date Due  . PNA vac Low Risk Adult (2 of  2 - PCV13) 09/29/2015  . OPHTHALMOLOGY EXAM  11/21/2017  . URINE MICROALBUMIN  12/05/2017  . HEMOGLOBIN A1C  12/06/2017  . INFLUENZA VACCINE  12/28/2017  . MAMMOGRAM  01/12/2018  . FOOT EXAM  06/08/2018  . TETANUS/TDAP  10/27/2020  . DEXA SCAN  Completed    Lab Results  Component Value  Date   WBC 5.8 12/05/2016   HGB 11.2 (L) 12/05/2016   HCT 31.1 (L) 12/05/2016   PLT 239.0 12/05/2016   GLUCOSE 138 (H) 06/08/2017   CHOL 221 (H) 06/08/2017   TRIG 115.0 06/08/2017   HDL 64.90 06/08/2017   LDLDIRECT 91.0 09/19/2016   LDLCALC 134 (H) 06/08/2017   ALT 16 06/08/2017   AST 18 06/08/2017   NA 140 06/08/2017   K 4.4 06/08/2017   CL 107 06/08/2017   CREATININE 0.85 06/08/2017   BUN 26 (H) 06/08/2017   CO2 27 06/08/2017   TSH 5.38 (H) 06/03/2016   INR 0.96 05/28/2012   HGBA1C 6.6 (H) 06/08/2017   MICROALBUR <0.7 12/05/2016    Lab Results  Component Value Date   TSH 5.38 (H) 06/03/2016   Lab Results  Component Value Date   WBC 5.8 12/05/2016   HGB 11.2 (L) 12/05/2016   HCT 31.1 (L) 12/05/2016   MCV 91.2 12/05/2016   PLT 239.0 12/05/2016   Lab Results  Component Value Date   NA 140 06/08/2017   K 4.4 06/08/2017   CO2 27 06/08/2017   GLUCOSE 138 (H) 06/08/2017   BUN 26 (H) 06/08/2017   CREATININE 0.85 06/08/2017   BILITOT 0.5 06/08/2017   ALKPHOS 56 06/08/2017   AST 18 06/08/2017   ALT 16 06/08/2017   PROT 7.4 06/08/2017   ALBUMIN 4.1 06/08/2017   CALCIUM 9.7 06/08/2017   ANIONGAP 9 08/03/2016   GFR 69.05 06/08/2017   Lab Results  Component Value Date   CHOL 221 (H) 06/08/2017   Lab Results  Component Value Date   HDL 64.90 06/08/2017   Lab Results  Component Value Date   LDLCALC 134 (H) 06/08/2017   Lab Results  Component Value Date   TRIG 115.0 06/08/2017   Lab Results  Component Value Date   CHOLHDL 3 06/08/2017   Lab Results  Component Value Date   HGBA1C 6.6 (H) 06/08/2017         Assessment & Plan:   Problem List Items Addressed This Visit    None    Visit Diagnoses    Pain of right lower extremity    -  Primary    in lyrica to tid  Pt has her cpe in July   I have discontinued [place] hydrochlorothiazide and [place]. I am also having her maintain her cholecalciferol, aspirin,  albuterol, omeprazole, Ca Phosphate-Cholecalciferol, Multiple Vitamins-Minerals (OCUVITE EXTRA PO), multivitamin with minerals, methocarbamol, Azelastine-Fluticasone, Fluticasone-Salmeterol, glucose blood, pregabalin, ONETOUCH DELICA LANCETS 74Q, MYRBETRIQ, celecoxib, alendronate, metFORMIN, and traMADol.  No orders of the defined types were placed in this encounter.   CMA served as Education administrator during this visit. History, Physical and Plan performed by medical provider. Documentation and orders reviewed and attested to.  [name], DO

## 2017-11-02 NOTE — Patient Instructions (Signed)

## 2017-11-06 ENCOUNTER — Other Ambulatory Visit: Payer: Self-pay | Admitting: Family Medicine

## 2017-11-06 DIAGNOSIS — N3281 Overactive bladder: Secondary | ICD-10-CM

## 2017-11-22 ENCOUNTER — Ambulatory Visit (INDEPENDENT_AMBULATORY_CARE_PROVIDER_SITE_OTHER): Payer: Medicare Other | Admitting: Orthopaedic Surgery

## 2017-11-29 ENCOUNTER — Other Ambulatory Visit: Payer: Self-pay | Admitting: Family Medicine

## 2017-11-29 DIAGNOSIS — E1140 Type 2 diabetes mellitus with diabetic neuropathy, unspecified: Secondary | ICD-10-CM

## 2017-12-01 NOTE — Telephone Encounter (Signed)
Last filled per database: tramadol 10/13/17 lyrica 09/26/17 Last written: tramadol 10/13/17 lyrica 09/26/17 Last ov: 11/02/17 Next ov: 12/11/17 Contract: will get at ov 12/11/17 UDS:  Will get at [facility] 12/11/17

## 2017-12-06 ENCOUNTER — Ambulatory Visit (INDEPENDENT_AMBULATORY_CARE_PROVIDER_SITE_OTHER): Payer: Medicare Other | Admitting: Orthopaedic Surgery

## 2017-12-06 ENCOUNTER — Ambulatory Visit: Payer: Medicare Other | Admitting: *Deleted

## 2017-12-06 DIAGNOSIS — M48062 Spinal stenosis, lumbar region with neurogenic claudication: Secondary | ICD-10-CM | POA: Diagnosis not present

## 2017-12-06 DIAGNOSIS — M4120 Other idiopathic scoliosis, site unspecified: Secondary | ICD-10-CM | POA: Diagnosis not present

## 2017-12-06 DIAGNOSIS — R030 Elevated blood-pressure reading, without diagnosis of hypertension: Secondary | ICD-10-CM | POA: Diagnosis not present

## 2017-12-06 DIAGNOSIS — M4317 Spondylolisthesis, lumbosacral region: Secondary | ICD-10-CM | POA: Diagnosis not present

## 2017-12-06 DIAGNOSIS — M5416 Radiculopathy, lumbar region: Secondary | ICD-10-CM | POA: Diagnosis not present

## 2017-12-06 NOTE — Progress Notes (Signed)
Subjective:   [name] is a 77 y.o. female who presents for Medicare Annual (Subsequent) preventive examination.  Pt recently moved to [facility]. Enjoys to play cards. Review of Systems:  No ROS.  Medicare Wellness Visit. Additional risk factors are reflected in the social history. Cardiac Risk Factors include: advanced age (>87men, >33 women);diabetes mellitus;dyslipidemia Sleep patterns: Sleeps about 8 hrs. No issues. Home Safety/Smoke Alarms: Feels safe in home. Smoke alarms in place.  Living environment; residence and Firearm Safety: Lives on 1st floor at [facility].   Female:       Mammo-  Pt states she will schedule today. Dexa scan- ordered       CCS- Cologuard 01/25/17-neg    Objective:     Vitals: BP (!) 120/50 (BP Location: Left Arm, Patient Position: Sitting, Cuff Size: Normal) Comment: vitals done by S.Marvel Plan, CMA  Pulse 64   Ht 5\' 4"  (1.626 m)   Wt 153 lb (69.4 kg)   SpO2 97%   BMI 26.26 kg/m   Body mass index is 26.26 kg/m.  Advanced Directives 12/11/2017 12/05/2016 08/14/2016 08/03/2016 07/26/2016 11/24/2015 07/31/2015  Does Patient Have a Medical Advance Directive? Yes Yes Yes Yes Yes Yes Yes  Type of Paramedic of [place];Living will [place];Living will Living will;Healthcare Power of Attorney Living will;Healthcare Power of [place];Living will Living will;Healthcare Power of Attorney -  Does patient want to make changes to medical advance directive? - - No - Patient declined No - Patient declined - No - Patient declined -  Copy of [place] in Chart? Yes No - copy requested Yes Yes - No - copy requested -  Pre-existing out of facility DNR order (yellow form or pink MOST form) - - - - - - -    Tobacco Social History   Tobacco Use  Smoking Status Never Smoker  Smokeless Tobacco Never Used     Counseling given: Not Answered   Clinical Intake:     Pain : No/denies pain                 Past Medical History:  Diagnosis Date  . Anemia   . Arthritis   . Asthma    adult onset  . Basal cell cancer    LUE; Porokeratosis also  . Chronic kidney disease 1963   strep in kidney due to strep throat-hospitalized 10 days  . Complication of anesthesia    small trachea  . Diabetes mellitus 2010   A1c 6.7%  . DVT (deep venous thrombosis) ([facility]) 2006   post immobilization post cns surgery  . GERD (gastroesophageal reflux disease)    very mild  . Granulomatous lung disease ([place]) 2002   incidental Xray finding  . Hyperlipidemia   . Hypertension 2004   Hypertensive response on Stress Test  . Paralysis ([place]) 2006   post cervical fusion with spinal sac tear  with hematoma   . PTE (pulmonary thromboembolism) ([place]) 2006   Past Surgical History:  Procedure Laterality Date  . ABDOMINAL EXPOSURE N/A 08/12/2016   Procedure: ABDOMINAL EXPOSURE;  Surgeon: Rosetta Posner, MD;  Location: [facility];  Service: Vascular;  Laterality: N/A;  . ANTERIOR LAT LUMBAR FUSION N/A 08/12/2016   Procedure: LUMBAR TWO-THREE, LUMBAR THREE-FOUR, LUMBAR FOUR-FIVE  ANTEROLATERAL LUMBAR INTERBODY FUSION;  Surgeon: Erline Levine, MD;  Location: [facility];  Service: Neurosurgery;  Laterality: N/A;  L2-3 L3-4 L4-5 Anterolateral lumbar interbody fusion  .  ANTERIOR LUMBAR FUSION N/A 08/12/2016   Procedure: Lumbar Five-Sacral One Anterior lumbar interbody fusion with Dr. Sherren Mocha Early to assist;  Surgeon: Erline Levine, MD;  Location: [facility];  Service: Neurosurgery;  Laterality: N/A;  L5-S1 Anterior lumbar interbody fusion with Dr. Sherren Mocha Early to assist  . BUNIONECTOMY    . CERVICAL FUSION  2006   Dr Annette Stable, NS;post op hematoma & cns leak & urinary retention  . COLONOSCOPY  1992 & 2002   negative  . epidural steroids  2006   cervical spine  . LUMBAR PERCUTANEOUS PEDICLE SCREW 4 LEVEL N/A 08/12/2016   Procedure: LUMBAR TWO-SACRAL ONE Percuataneous Pedicle Screws;  Surgeon: Erline Levine, MD;  Location: [facility];  Service: Neurosurgery;  Laterality: N/A;  . ROTATOR CUFF REPAIR  2009   Right  . SEPTOPLASTY    . TONSILLECTOMY  77 years old  . TOTAL HIP ARTHROPLASTY  06/01/2012   Procedure: TOTAL HIP ARTHROPLASTY ANTERIOR APPROACH;  Surgeon: Mcarthur Rossetti, MD;  Location: [facility];  Service: Orthopedics;  Laterality: Left;  Left Total Hip Arthroplasty  . TUBAL LIGATION     Family History  Problem Relation Age of Onset  . COPD Father        emphysema  . Cancer Mother        cns cancer  . Diabetes Sister        TWIN sister ; also Fibromyalgia ; S/P stent 2004  . Heart disease Sister        stents @ 61 & 53  . Stroke Maternal Grandmother        in  late 49s  . Transient ischemic attack Paternal Aunt    Social History   Socioeconomic History  . Marital status: Divorced    Spouse name: Not on file  . Number of children: Not on file  . Years of education: Not on file  . Highest education level: Not on file  Occupational History  . Not on file  Social Needs  . Financial resource strain: Not on file  . Food insecurity:    Worry: Not on file    Inability: Not on file  . Transportation needs:    Medical: Not on file    Non-medical: Not on file  Tobacco Use  . Smoking status: Never Smoker  . Smokeless tobacco: Never Used  Substance and Sexual Activity  . Alcohol use: Yes    Comment: twice a week; 2-4/week  . Drug use: No  . Sexual activity: Never  Lifestyle  . Physical activity:    Days per week: Not on file    Minutes per session: Not on file  . Stress: Not on file  Relationships  . Social connections:    Talks on phone: Not on file    Gets together: Not on file    Attends religious service: Not on file    Active member of club or organization: Not on file    Attends meetings of clubs or organizations: Not on file    Relationship status: Not on file  Other Topics Concern  . Not on file  Social History Narrative  . Not on file    Outpatient  Encounter Medications as of 12/11/2017  Medication Sig  . alendronate (FOSAMAX) 70 MG tablet TAKE 1 TABLET(70 MG) BY MOUTH EVERY 7 DAYS WITH A FULL GLASS OF WATER AND ON AN EMPTY STOMACH  . aspirin 81 MG tablet Take 81 mg by mouth daily.  . Azelastine-Fluticasone 137-50 MCG/ACT SUSP  2 sprays each nostril bid  . Ca Phosphate-Cholecalciferol (CALCIUM 500 + D3) 250-500 MG-UNIT CHEW Chew by mouth.  . celecoxib (CELEBREX) 200 MG capsule TAKE 1 CAPSULE(200 MG) BY MOUTH DAILY  . cholecalciferol (VITAMIN D) 1000 UNITS tablet Take 1,000 Units by mouth daily.   . Fluticasone-Salmeterol (ADVAIR DISKUS) 250-50 MCG/DOSE AEPB USE 1 INHALATION TWO TIMES  DAILY ; RINSE MOUTH  . glucose blood (ONETOUCH VERIO) test strip Check blood sugar twice daily  . LYRICA 100 MG capsule TAKE ONE CAPSULE BY MOUTH TWICE DAILY  . metFORMIN (GLUCOPHAGE-XR) 500 MG 24 hr tablet TAKE 1 TABLET(500 MG) BY MOUTH DAILY WITH BREAKFAST  . methocarbamol (ROBAXIN) 500 MG tablet Take 1 tablet by mouth 3 (three) times daily.  . Multiple Vitamin (MULTIVITAMIN WITH MINERALS) TABS tablet Take 1 tablet by mouth daily.  . Multiple Vitamins-Minerals (OCUVITE EXTRA PO) Take 1 tablet by mouth daily.  [name] MYRBETRIQ 50 MG TB24 tablet TAKE 1 TABLET(50 MG) BY MOUTH DAILY  . omeprazole (PRILOSEC) 20 MG capsule Take 20 mg by mouth daily as needed.  Glory Rosebush DELICA LANCETS 74Q MISC Check blood sugar twice daily  . traMADol (ULTRAM) 50 MG tablet TAKE 1 TABLET BY MOUTH EVERY 6 HOURS AS NEEDED  . albuterol (PROAIR HFA) 108 (90 Base) MCG/ACT inhaler Inhale 2 puffs into the lungs every 6 (six) hours as needed for wheezing or shortness of breath.   No facility-administered encounter medications on file as of 12/11/2017.     Activities of Daily Living In your present state of health, do you have any difficulty performing the following activities: 12/11/2017  Hearing? N  Vision? N  Difficulty concentrating or making decisions? N  Walking or climbing stairs? N   Dressing or bathing? N  Doing errands, shopping? N  Preparing Food and eating ? N  Using the Toilet? N  In the past six months, have you accidently leaked urine? N  Do you have problems with loss of bowel control? N  Managing your Medications? N  Managing your Finances? N  Housekeeping or managing your Housekeeping? N  Some recent data might be hidden    Patient Care Team: Carollee Herter, Alferd Apa, DO as PCP - General (Family Medicine) Deneise Lever, MD as Consulting Physician (Pulmonary Disease) Erline Levine, MD as Consulting Physician (Neurosurgery) Marlou Sa, Tonna Corner, MD as Consulting Physician (Orthopedic Surgery) Mcarthur Rossetti, MD as Consulting Physician (Orthopedic Surgery) Danella Sensing, MD as Consulting Physician (Dermatology)    Assessment:   This is a routine wellness examination for [facility]. Physical assessment deferred to PCP.  Exercise Activities and Dietary recommendations Current Exercise Habits: Structured exercise class, Type of exercise: stretching;calisthenics, Time (Minutes): 30, Frequency (Times/Week): 3, Weekly Exercise (Minutes/Week): 90, Intensity: Mild, Exercise limited by: None identified Diet (meal preparation, eat out, water intake, caffeinated beverages, dairy products, fruits and vegetables): in general, a "healthy" diet  , on average, 3 meals per day    Goals    . DIET - INCREASE WATER INTAKE       Fall Risk Fall Risk  12/11/2017 12/05/2016 11/24/2015 06/12/2015 06/02/2014  Falls in the past year? No No No No No  Number falls in past yr: - - - - -  Injury with Fall? - - - - -    Depression Screen PHQ 2/9 Scores 12/11/2017 12/11/2017 12/05/2016 09/19/2016  PHQ - 2 Score 0 0 0 0     Cognitive Function Ad8 score reviewed for issues:  Issues making decisions:no  Less interest  in hobbies / activities:no  Repeats questions, stories (family complaining):no  Trouble using ordinary gadgets (microwave, computer, phone):no  Forgets the month  or year: no  Mismanaging finances: no  Remembering appts:no  Daily problems with thinking and/or memory:no Ad8 score is=0  MMSE - Mini Mental State Exam 12/05/2016  Orientation to time 5  Orientation to Place 5  Registration 3  Attention/ Calculation 4  Recall 3  Language- name 2 objects 2  Language- repeat 1  Language- follow 3 step command 3  Language- read & follow direction 1  Write a sentence 1  Copy design 1  Total score 29        Immunization History  Administered Date(s) Administered  . Influenza Split 03/15/2012  . Influenza Whole 02/27/2009  . Influenza, High Dose Seasonal PF 03/19/2013, 03/21/2016  . Influenza,inj,Quad PF,6+ Mos 03/13/2014, 02/16/2015  . Influenza-Unspecified 03/15/2012, 01/28/2014, 03/13/2014, 02/16/2015, 02/14/2017  . Pneumococcal Conjugate-13 05/16/2013  . Pneumococcal Polysaccharide-23 09/29/2014  . Pneumococcal-Unspecified 05/16/2013  . Tdap 10/28/2010  . Zoster 05/12/2014   Screening Tests Health Maintenance  Topic Date Due  . PNA vac Low Risk Adult (2 of 2 - PCV13) 09/29/2015  . OPHTHALMOLOGY EXAM  11/21/2017  . URINE MICROALBUMIN  12/05/2017  . HEMOGLOBIN A1C  12/06/2017  . INFLUENZA VACCINE  12/28/2017  . MAMMOGRAM  01/12/2018  . FOOT EXAM  06/08/2018  . TETANUS/TDAP  10/27/2020  . DEXA SCAN  Completed      Plan:    Please schedule your next medicare wellness visit with me in 1 yr.  Continue to eat heart healthy diet (full of fruits, vegetables, whole grains, lean protein, water--limit salt, fat, and sugar intake) and increase physical activity as tolerated.  Continue doing brain stimulating activities (puzzles, reading, adult coloring books, staying active) to keep memory sharp.     I have personally reviewed and noted the following in the patient's chart:   . Medical and social history . Use of alcohol, tobacco or illicit drugs  . Current medications and supplements . Functional ability and status . Nutritional  status . Physical activity . Advanced directives . List of other physicians . Hospitalizations, surgeries, and ER visits in previous 12 months . Vitals . Screenings to include cognitive, depression, and falls . Referrals and appointments  In addition, I have reviewed and discussed with patient certain preventive protocols, quality metrics, and best practice recommendations. A written personalized care plan for preventive services as well as general preventive health recommendations were provided to patient.     Shela Nevin, [place]  12/11/2017

## 2017-12-07 DIAGNOSIS — M5416 Radiculopathy, lumbar region: Secondary | ICD-10-CM | POA: Diagnosis not present

## 2017-12-11 ENCOUNTER — Other Ambulatory Visit: Payer: Self-pay | Admitting: Family Medicine

## 2017-12-11 ENCOUNTER — Encounter: Payer: Self-pay | Admitting: Family Medicine

## 2017-12-11 ENCOUNTER — Ambulatory Visit (INDEPENDENT_AMBULATORY_CARE_PROVIDER_SITE_OTHER): Payer: Medicare Other | Admitting: Family Medicine

## 2017-12-11 ENCOUNTER — Encounter: Payer: Self-pay | Admitting: *Deleted

## 2017-12-11 ENCOUNTER — Ambulatory Visit (INDEPENDENT_AMBULATORY_CARE_PROVIDER_SITE_OTHER): Payer: Medicare Other | Admitting: *Deleted

## 2017-12-11 VITALS — BP 120/50 | HR 64 | Temp 98.4°F | Resp 16 | Ht 64.0 in | Wt 153.6 lb

## 2017-12-11 VITALS — BP 120/50 | HR 64 | Ht 64.0 in | Wt 153.0 lb

## 2017-12-11 DIAGNOSIS — Z0000 Encounter for general adult medical examination without abnormal findings: Secondary | ICD-10-CM

## 2017-12-11 DIAGNOSIS — Z79899 Other long term (current) drug therapy: Secondary | ICD-10-CM | POA: Diagnosis not present

## 2017-12-11 DIAGNOSIS — E1151 Type 2 diabetes mellitus with diabetic peripheral angiopathy without gangrene: Secondary | ICD-10-CM

## 2017-12-11 DIAGNOSIS — E1169 Type 2 diabetes mellitus with other specified complication: Secondary | ICD-10-CM

## 2017-12-11 DIAGNOSIS — E785 Hyperlipidemia, unspecified: Secondary | ICD-10-CM

## 2017-12-11 DIAGNOSIS — I10 Essential (primary) hypertension: Secondary | ICD-10-CM

## 2017-12-11 DIAGNOSIS — M79604 Pain in right leg: Secondary | ICD-10-CM | POA: Diagnosis not present

## 2017-12-11 DIAGNOSIS — M8580 Other specified disorders of bone density and structure, unspecified site: Secondary | ICD-10-CM | POA: Diagnosis not present

## 2017-12-11 LAB — LIPID PANEL
CHOL/HDL RATIO: 5
Cholesterol: 249 mg/dL — ABNORMAL HIGH (ref 0–200)
HDL: 52.8 mg/dL (ref >39.00)
NONHDL: 195.77
TRIGLYCERIDES: 336 mg/dL — AB (ref 0.0–149.0)
VLDL: 67.2 mg/dL — AB (ref 0.0–40.0)

## 2017-12-11 LAB — COMPREHENSIVE METABOLIC PANEL
ALBUMIN: 4.1 g/dL (ref 3.5–5.2)
ALT: 14 U/L (ref 0–35)
AST: 13 U/L (ref 0–37)
Alkaline Phosphatase: 73 U/L (ref 39–117)
BUN: 19 mg/dL (ref 6–23)
CALCIUM: 9.9 mg/dL (ref 8.4–10.5)
CO2: 25 mEq/L (ref 19–32)
Chloride: 104 mEq/L (ref 96–112)
Creatinine, Ser: 0.97 mg/dL (ref 0.40–1.20)
GFR: 59.21 mL/min — ABNORMAL LOW (ref >60.00)
Glucose, Bld: 141 mg/dL — ABNORMAL HIGH (ref 70–99)
Potassium: 4 mEq/L (ref 3.5–5.1)
SODIUM: 140 meq/L (ref 135–145)
TOTAL PROTEIN: 6.9 g/dL (ref 6.0–8.3)
Total Bilirubin: 0.5 mg/dL (ref 0.2–1.2)

## 2017-12-11 LAB — CBC WITH DIFFERENTIAL/PLATELET
BASOS ABS: 0 10*3/uL (ref 0.0–0.1)
Basophils Relative: 0.4 % (ref 0.0–3.0)
EOS ABS: 0.2 10*3/uL (ref 0.0–0.7)
Eosinophils Relative: 2 % (ref 0.0–5.0)
HCT: 34.2 % — ABNORMAL LOW (ref 36.0–46.0)
Hemoglobin: 12.2 g/dL (ref 12.0–15.0)
LYMPHS ABS: 1.9 10*3/uL (ref 0.7–4.0)
Lymphocytes Relative: 25.7 % (ref 12.0–46.0)
MCHC: 35.7 g/dL (ref 30.0–36.0)
MCV: 91.5 fl (ref 78.0–100.0)
MONOS PCT: 8.3 % (ref 3.0–12.0)
Monocytes Absolute: 0.6 10*3/uL (ref 0.1–1.0)
NEUTROS PCT: 63.6 % (ref 43.0–77.0)
Neutro Abs: 4.8 10*3/uL (ref 1.4–7.7)
Platelets: 220 10*3/uL (ref 150.0–400.0)
RBC: 3.73 Mil/uL — AB (ref 3.87–5.11)
RDW: 14.9 % (ref 11.5–15.5)
WBC: 7.5 10*3/uL (ref 4.0–10.5)

## 2017-12-11 LAB — MICROALBUMIN / CREATININE URINE RATIO
Creatinine,U: 107.7 mg/dL
Microalb Creat Ratio: 0.7 mg/g (ref 0.0–30.0)

## 2017-12-11 LAB — LDL CHOLESTEROL, DIRECT: LDL DIRECT: 127 mg/dL

## 2017-12-11 LAB — HEMOGLOBIN A1C: HEMOGLOBIN A1C: 6.5 % (ref 4.6–6.5)

## 2017-12-11 NOTE — Addendum Note (Signed)
Addended by: Kem Boroughs D on: 12/11/2017 12:03 PM   Modules accepted: Orders

## 2017-12-11 NOTE — Patient Instructions (Signed)
Preventive Care 77 Years and Older, Female Preventive care refers to lifestyle choices and visits with your health care provider that can promote health and wellness. What does preventive care include?  A yearly physical exam. This is also called an annual well check.  Dental exams once or twice a year.  Routine eye exams. Ask your health care provider how often you should have your eyes checked.  Personal lifestyle choices, including: ? Daily care of your teeth and gums. ? Regular physical activity. ? Eating a healthy diet. ? Avoiding tobacco and drug use. ? Limiting alcohol use. ? Practicing safe sex. ? Taking low-dose aspirin every day. ? Taking vitamin and mineral supplements as recommended by your health care provider. What happens during an annual well check? The services and screenings done by your health care provider during your annual well check will depend on your age, overall health, lifestyle risk factors, and family history of disease. Counseling Your health care provider may ask you questions about your:  Alcohol use.  Tobacco use.  Drug use.  Emotional well-being.  Home and relationship well-being.  Sexual activity.  Eating habits.  History of falls.  Memory and ability to understand (cognition).  Work and work environment.  Reproductive health.  Screening You may have the following tests or measurements:  Height, weight, and BMI.  Blood pressure.  Lipid and cholesterol levels. These may be checked every 5 years, or more frequently if you are over 50 years old.  Skin check.  Lung cancer screening. You may have this screening every year starting at age 55 if you have a 30-pack-year history of smoking and currently smoke or have quit within the past 15 years.  Fecal occult blood test (FOBT) of the stool. You may have this test every year starting at age 50.  Flexible sigmoidoscopy or colonoscopy. You may have a sigmoidoscopy every 5 years or  a colonoscopy every 10 years starting at age 50.  Hepatitis C blood test.  Hepatitis B blood test.  Sexually transmitted disease (STD) testing.  Diabetes screening. This is done by checking your blood sugar (glucose) after you have not eaten for a while (fasting). You may have this done every 1-3 years.  Bone density scan. This is done to screen for osteoporosis. You may have this done starting at age 77.  Mammogram. This may be done every 1-2 years. Talk to your health care provider about how often you should have regular mammograms.  Talk with your health care provider about your test results, treatment options, and if necessary, the need for more tests. Vaccines Your health care provider may recommend certain vaccines, such as:  Influenza vaccine. This is recommended every year.  Tetanus, diphtheria, and acellular pertussis (Tdap, Td) vaccine. You may need a Td booster every 10 years.  Varicella vaccine. You may need this if you have not been vaccinated.  Zoster vaccine. You may need this after age 60.  Measles, mumps, and rubella (MMR) vaccine. You may need at least one dose of MMR if you were born in 1957 or later. You may also need a second dose.  Pneumococcal 13-valent conjugate (PCV13) vaccine. One dose is recommended after age 77.  Pneumococcal polysaccharide (PPSV23) vaccine. One dose is recommended after age 77.  Meningococcal vaccine. You may need this if you have certain conditions.  Hepatitis A vaccine. You may need this if you have certain conditions or if you travel or work in places where you may be exposed to hepatitis   A.  Hepatitis B vaccine. You may need this if you have certain conditions or if you travel or work in places where you may be exposed to hepatitis B.  Haemophilus influenzae type b (Hib) vaccine. You may need this if you have certain conditions.  Talk to your health care provider about which screenings and vaccines you need and how often you  need them. This information is not intended to replace advice given to you by your health care provider. Make sure you discuss any questions you have with your health care provider. Document Released: 06/12/2015 Document Revised: 02/03/2016 Document Reviewed: 03/17/2015 Elsevier Interactive Patient Education  2018 [employer]  

## 2017-12-11 NOTE — Patient Instructions (Signed)
Please schedule your next medicare wellness visit with me in 1 yr.  Continue to eat heart healthy diet (full of fruits, vegetables, whole grains, lean protein, water--limit salt, fat, and sugar intake) and increase physical activity as tolerated.  Continue doing brain stimulating activities (puzzles, reading, adult coloring books, staying active) to keep memory sharp.    [name] , Thank you for taking time to come for your Medicare Wellness Visit. I appreciate your ongoing commitment to your health goals. Please review the following plan we discussed and let me know if I can assist you in the future.   These are the goals we discussed: Goals    . DIET - INCREASE WATER INTAKE       This is a list of the screening recommended for you and due dates:  Health Maintenance  Topic Date Due  . Pneumonia vaccines (2 of 2 - PCV13) 09/29/2015  . Eye exam for diabetics  11/21/2017  . Urine Protein Check  12/05/2017  . Hemoglobin A1C  12/06/2017  . Flu Shot  12/28/2017  . Mammogram  01/12/2018  . Complete foot exam   06/08/2018  . Tetanus Vaccine  10/27/2020  . DEXA scan (bone density measurement)  Completed    Health Maintenance for Postmenopausal Women Menopause is a normal process in which your reproductive ability comes to an end. This process happens gradually over a span of months to years, usually between the ages of 82 and 11. Menopause is complete when you have missed 12 consecutive menstrual periods. It is important to talk with your health care provider about some of the most common conditions that affect postmenopausal women, such as heart disease, cancer, and bone loss (osteoporosis). Adopting a healthy lifestyle and getting preventive care can help to promote your health and wellness. Those actions can also lower your chances of developing some of these common conditions. What should I know about menopause? During menopause, you may experience a number of symptoms, such  as:  Moderate-to-severe hot flashes.  Night sweats.  Decrease in sex drive.  Mood swings.  Headaches.  Tiredness.  Irritability.  Memory problems.  Insomnia.  Choosing to treat or not to treat menopausal changes is an individual decision that you make with your health care provider. What should I know about hormone replacement therapy and supplements? Hormone therapy products are effective for treating symptoms that are associated with menopause, such as hot flashes and night sweats. Hormone replacement carries certain risks, especially as you become older. If you are thinking about using estrogen or estrogen with progestin treatments, discuss the benefits and risks with your health care provider. What should I know about heart disease and stroke? Heart disease, heart attack, and stroke become more likely as you age. This may be due, in part, to the hormonal changes that your body experiences during menopause. These can affect how your body processes dietary fats, triglycerides, and cholesterol. Heart attack and stroke are both medical emergencies. There are many things that you can do to help prevent heart disease and stroke:  Have your blood pressure checked at least every 1-2 years. High blood pressure causes heart disease and increases the risk of stroke.  If you are 34-29 years old, ask your health care provider if you should take aspirin to prevent a heart attack or a stroke.  Do not use any tobacco products, including cigarettes, chewing tobacco, or electronic cigarettes. If you need help quitting, ask your health care provider.  It is important to  eat a healthy diet and maintain a healthy weight. ? Be sure to include plenty of vegetables, fruits, low-fat dairy products, and lean protein. ? Avoid eating foods that are high in solid fats, added sugars, or salt (sodium).  Get regular exercise. This is one of the most important things that you can do for your health. ? Try  to exercise for at least 150 minutes each week. The type of exercise that you do should increase your heart rate and make you sweat. This is known as moderate-intensity exercise. ? Try to do strengthening exercises at least twice each week. Do these in addition to the moderate-intensity exercise.  Know your numbers.Ask your health care provider to check your cholesterol and your blood glucose. Continue to have your blood tested as directed by your health care provider.  What should I know about cancer screening? There are several types of cancer. Take the following steps to reduce your risk and to catch any cancer development as early as possible. Breast Cancer  Practice breast self-awareness. ? This means understanding how your breasts normally appear and feel. ? It also means doing regular breast self-exams. Let your health care provider know about any changes, no matter how small.  If you are 72 or older, have a clinician do a breast exam (clinical breast exam or CBE) every year. Depending on your age, family history, and medical history, it may be recommended that you also have a yearly breast X-ray (mammogram).  If you have a family history of breast cancer, talk with your health care provider about genetic screening.  If you are at high risk for breast cancer, talk with your health care provider about having an MRI and a mammogram every year.  Breast cancer (BRCA) gene test is recommended for women who have family members with BRCA-related cancers. Results of the assessment will determine the need for genetic counseling and BRCA1 and for BRCA2 testing. BRCA-related cancers include these types: ? Breast. This occurs in males or females. ? Ovarian. ? Tubal. This may also be called fallopian tube cancer. ? Cancer of the abdominal or pelvic lining (peritoneal cancer). ? Prostate. ? Pancreatic.  Cervical, Uterine, and Ovarian Cancer Your health care provider may recommend that you be  screened regularly for cancer of the pelvic organs. These include your ovaries, uterus, and vagina. This screening involves a pelvic exam, which includes checking for microscopic changes to the surface of your cervix (Pap test).  For women ages 21-65, health care providers may recommend a pelvic exam and a Pap test every three years. For women ages 35-65, they may recommend the Pap test and pelvic exam, combined with testing for human papilloma virus (HPV), every five years. Some types of HPV increase your risk of cervical cancer. Testing for HPV may also be done on women of any age who have unclear Pap test results.  Other health care providers may not recommend any screening for nonpregnant women who are considered low risk for pelvic cancer and have no symptoms. Ask your health care provider if a screening pelvic exam is right for you.  If you have had past treatment for cervical cancer or a condition that could lead to cancer, you need Pap tests and screening for cancer for at least 20 years after your treatment. If Pap tests have been discontinued for you, your risk factors (such as having a new sexual partner) need to be reassessed to determine if you should start having screenings again. Some women have  medical problems that increase the chance of getting cervical cancer. In these cases, your health care provider may recommend that you have screening and Pap tests more often.  If you have a family history of uterine cancer or ovarian cancer, talk with your health care provider about genetic screening.  If you have vaginal bleeding after reaching menopause, tell your health care provider.  There are currently no reliable tests available to screen for ovarian cancer.  Lung Cancer Lung cancer screening is recommended for adults 56-51 years old who are at high risk for lung cancer because of a history of smoking. A yearly low-dose CT scan of the lungs is recommended if you:  Currently  smoke.  Have a history of at least 30 pack-years of smoking and you currently smoke or have quit within the past 15 years. A pack-year is smoking an average of one pack of cigarettes per day for one year.  Yearly screening should:  Continue until it has been 15 years since you quit.  Stop if you develop a health problem that would prevent you from having lung cancer treatment.  Colorectal Cancer  This type of cancer can be detected and can often be prevented.  Routine colorectal cancer screening usually begins at age 80 and continues through age 92.  If you have risk factors for colon cancer, your health care provider may recommend that you be screened at an earlier age.  If you have a family history of colorectal cancer, talk with your health care provider about genetic screening.  Your health care provider may also recommend using home test kits to check for hidden blood in your stool.  A small camera at the end of a tube can be used to examine your colon directly (sigmoidoscopy or colonoscopy). This is done to check for the earliest forms of colorectal cancer.  Direct examination of the colon should be repeated every 5-10 years until age 50. However, if early forms of precancerous polyps or small growths are found or if you have a family history or genetic risk for colorectal cancer, you may need to be screened more often.  Skin Cancer  Check your skin from head to toe regularly.  Monitor any moles. Be sure to tell your health care provider: ? About any new moles or changes in moles, especially if there is a change in a mole's shape or color. ? If you have a mole that is larger than the size of a pencil eraser.  If any of your family members has a history of skin cancer, especially at a young age, talk with your health care provider about genetic screening.  Always use sunscreen. Apply sunscreen liberally and repeatedly throughout the day.  Whenever you are outside, protect  yourself by wearing long sleeves, pants, a wide-brimmed hat, and sunglasses.  What should I know about osteoporosis? Osteoporosis is a condition in which bone destruction happens more quickly than new bone creation. After menopause, you may be at an increased risk for osteoporosis. To help prevent osteoporosis or the bone fractures that can happen because of osteoporosis, the following is recommended:  If you are 55-53 years old, get at least 1,000 mg of calcium and at least 600 mg of vitamin D per day.  If you are older than age 22 but younger than age 29, get at least 1,200 mg of calcium and at least 600 mg of vitamin D per day.  If you are older than age 60, get at least 34,200  mg of calcium and at least 800 mg of vitamin D per day.  Smoking and excessive alcohol intake increase the risk of osteoporosis. Eat foods that are rich in calcium and vitamin D, and do weight-bearing exercises several times each week as directed by your health care provider. What should I know about how menopause affects my mental health? Depression may occur at any age, but it is more common as you become older. Common symptoms of depression include:  Low or sad mood.  Changes in sleep patterns.  Changes in appetite or eating patterns.  Feeling an overall lack of motivation or enjoyment of activities that you previously enjoyed.  Frequent crying spells.  Talk with your health care provider if you think that you are experiencing depression. What should I know about immunizations? It is important that you get and maintain your immunizations. These include:  Tetanus, diphtheria, and pertussis (Tdap) booster vaccine.  Influenza every year before the flu season begins.  Pneumonia vaccine.  Shingles vaccine.  Your health care provider may also recommend other immunizations. This information is not intended to replace advice given to you by your health care provider. Make sure you discuss any questions you  have with your health care provider. Document Released: 07/08/2005 Document Revised: 12/04/2015 Document Reviewed: 02/17/2015 Elsevier Interactive Patient Education  2018 [employer].

## 2017-12-11 NOTE — Progress Notes (Signed)
Subjective:     [name] is a 77 y.o. female and is here for a comprehensive physical exam. The patient reports no new problems.  she saw Dr Vertell Limber and water therapy was ordered..  She is walking with a walker and moved into an indp living at [facility].   HPI HYPERTENSION   Blood pressure range-not checking   Chest pain- no      Dyspnea- no Lightheadedness- no   Edema- no  Other side effects - no   Medication compliance: good Low salt diet- yes    DIABETES    Blood Sugar ranges-good per pt  Polyuria- no New Visual problems- no  Hypoglycemic symptoms- no  Other side effects-no Medication compliance - good Last eye exam- utd    HYPERLIPIDEMIA  Medication compliance- good RUQ pain- no  Muscle aches- no Other side effects-no    Social History   Socioeconomic History  . Marital status: Divorced    Spouse name: Not on file  . Number of children: Not on file  . Years of education: Not on file  . Highest education level: Not on file  Occupational History  . Not on file  Social Needs  . Financial resource strain: Not on file  . Food insecurity:    Worry: Not on file    Inability: Not on file  . Transportation needs:    Medical: Not on file    Non-medical: Not on file  Tobacco Use  . Smoking status: Never Smoker  . Smokeless tobacco: Never Used  Substance and Sexual Activity  . Alcohol use: Yes    Comment: twice a week; 2-4/week  . Drug use: No  . Sexual activity: Never  Lifestyle  . Physical activity:    Days per week: Not on file    Minutes per session: Not on file  . Stress: Not on file  Relationships  . Social connections:    Talks on phone: Not on file    Gets together: Not on file    Attends religious service: Not on file    Active member of club or organization: Not on file    Attends meetings of clubs or organizations: Not on file    Relationship status: Not on file  . Intimate partner violence:    Fear of current or ex partner: Not on file     Emotionally abused: Not on file    Physically abused: Not on file    Forced sexual activity: Not on file  Other Topics Concern  . Not on file  Social History Narrative  . Not on file   Health Maintenance  Topic Date Due  . PNA vac Low Risk Adult (2 of 2 - PCV13) 09/29/2015  . OPHTHALMOLOGY EXAM  11/21/2017  . URINE MICROALBUMIN  12/05/2017  . HEMOGLOBIN A1C  12/06/2017  . INFLUENZA VACCINE  12/28/2017  . MAMMOGRAM  01/12/2018  . FOOT EXAM  06/08/2018  . TETANUS/TDAP  10/27/2020  . DEXA SCAN  Completed    The following portions of the patient's history were reviewed and updated as appropriate:  She  has a past medical history of Anemia, Arthritis, Asthma, Basal cell cancer, Chronic kidney disease (4315), Complication of anesthesia, Diabetes mellitus (2010), DVT (deep venous thrombosis) ([place]) (2006), GERD (gastroesophageal reflux disease), Granulomatous lung disease ([place]) (2002), Hyperlipidemia, Hypertension (2004), Paralysis ([place]) (2006), and PTE (pulmonary thromboembolism) ([place]) (2006). She does not have any pertinent problems on file. She  has a past surgical history that includes  Bunionectomy; Septoplasty; Tubal ligation; Colonoscopy (1992 & 2002); Rotator cuff repair (2009); epidural steroids (2006); Cervical fusion (2006); Tonsillectomy (77 years old); Total hip arthroplasty (06/01/2012); Anterior lumbar fusion (N/A, 08/12/2016); Anterior lat lumbar fusion (N/A, 08/12/2016); Lumbar percutaneous pedicle screw 4 level (N/A, 08/12/2016); and Abdominal exposure (N/A, 08/12/2016). Her family history includes COPD in her father; Cancer in her mother; Diabetes in her sister; Heart disease in her sister; Stroke in her maternal grandmother; Transient ischemic attack in her paternal aunt. She  reports that she has never smoked. She has never used smokeless tobacco. She reports that she drinks alcohol. She reports that she does not use drugs. She has a current medication list which includes the  following prescription(s): alendronate, aspirin, azelastine-fluticasone, ca phosphate-cholecalciferol, celecoxib, cholecalciferol, fluticasone-salmeterol, glucose blood, lyrica, metformin, methocarbamol, multivitamin with minerals, multiple vitamins-minerals, myrbetriq, omeprazole, onetouch delica lancets 70Y, tramadol, and albuterol. Current Outpatient Medications on File Prior to Visit  Medication Sig Dispense Refill  . alendronate (FOSAMAX) 70 MG tablet TAKE 1 TABLET(70 MG) BY MOUTH EVERY 7 DAYS WITH A FULL GLASS OF WATER AND ON AN EMPTY STOMACH 12 tablet 0  . aspirin 81 MG tablet Take 81 mg by mouth daily.    . Azelastine-Fluticasone 137-50 MCG/ACT SUSP 2 sprays each nostril bid 1 Bottle 5  . Ca Phosphate-Cholecalciferol (CALCIUM 500 + D3) 250-500 MG-UNIT CHEW Chew by mouth.    . celecoxib (CELEBREX) 200 MG capsule TAKE 1 CAPSULE(200 MG) BY MOUTH DAILY 90 capsule 0  . cholecalciferol (VITAMIN D) 1000 UNITS tablet Take 1,000 Units by mouth daily.     . Fluticasone-Salmeterol (ADVAIR DISKUS) 250-50 MCG/DOSE AEPB USE 1 INHALATION TWO TIMES  DAILY ; RINSE MOUTH 180 each 3  . glucose blood (ONETOUCH VERIO) test strip Check blood sugar twice daily 200 each 1  . LYRICA 100 MG capsule TAKE ONE CAPSULE BY MOUTH TWICE DAILY 180 capsule 0  . metFORMIN (GLUCOPHAGE-XR) 500 MG 24 hr tablet TAKE 1 TABLET(500 MG) BY MOUTH DAILY WITH BREAKFAST 90 tablet 0  . methocarbamol (ROBAXIN) 500 MG tablet Take 1 tablet by mouth 3 (three) times daily.  0  . Multiple Vitamin (MULTIVITAMIN WITH MINERALS) TABS tablet Take 1 tablet by mouth daily.    . Multiple Vitamins-Minerals (OCUVITE EXTRA PO) Take 1 tablet by mouth daily.    [name] MYRBETRIQ 50 MG TB24 tablet TAKE 1 TABLET(50 MG) BY MOUTH DAILY 30 tablet 0  . omeprazole (PRILOSEC) 20 MG capsule Take 20 mg by mouth daily as needed.    Glory Rosebush DELICA LANCETS 17C MISC Check blood sugar twice daily 200 each 1  . traMADol (ULTRAM) 50 MG tablet TAKE 1 TABLET BY MOUTH EVERY 6  HOURS AS NEEDED 60 tablet 0  . albuterol (PROAIR HFA) 108 (90 Base) MCG/ACT inhaler Inhale 2 puffs into the lungs every 6 (six) hours as needed for wheezing or shortness of breath. 3 Inhaler 3   No current facility-administered medications on file prior to visit.    She is allergic to statins..  Review of Systems Review of Systems  Constitutional: Negative for activity change, appetite change and fatigue.  HENT: Negative for hearing loss, congestion, tinnitus and ear discharge.  dentist q57m Eyes: Negative for visual disturbance (see optho q1y -- vision corrected to 20/20 with glasses).  Respiratory: Negative for cough, chest tightness and shortness of breath.   Cardiovascular: Negative for chest pain, palpitations and leg swelling.  Gastrointestinal: Negative for abdominal pain, diarrhea, constipation and abdominal distention.  Genitourinary: Negative for urgency, frequency,  decreased urine volume and difficulty urinating.  Musculoskeletal: + knee pain R----  +gait problems---walking with walker,  _+ back pain  Skin: Negative for color change, pallor and rash.  Neurological: Negative for dizziness, light-headedness, numbness and headaches.  Hematological: Negative for adenopathy. Does not bruise/bleed easily.  Psychiatric/Behavioral: Negative for suicidal ideas, confusion, sleep disturbance, self-injury, dysphoric mood, decreased concentration and agitation.       Objective:    BP (!) 120/50 (BP Location: Right Arm, Cuff Size: Normal)   Pulse 64   Temp 98.4 F (36.9 C) (Oral)   Resp 16   Ht 5\' 4"  (1.626 m)   Wt 153 lb 9.6 oz (69.7 kg)   SpO2 97%   BMI 26.37 kg/m  General appearance: alert, cooperative, appears stated age and no distress Head: Normocephalic, without obvious abnormality, atraumatic Eyes: negative findings: lids and lashes normal and pupils equal, round, reactive to light and accomodation Ears: normal TM's and external ear canals both ears Nose: Nares normal.  Septum midline. Mucosa normal. No drainage or sinus tenderness. Throat: lips, mucosa, and tongue normal; teeth and gums normal Neck: no adenopathy, no carotid bruit, no JVD, supple, symmetrical, trachea midline and thyroid not enlarged, symmetric, no tenderness/mass/nodules Back: symmetric, no curvature. ROM normal. No CVA tenderness. Lungs: clear to auscultation bilaterally Breasts: normal appearance, no masses or tenderness Heart: S1, S2 normal Abdomen: soft, non-tender; bowel sounds normal; no masses,  no organomegaly Pelvic: not indicated; post-menopausal, no abnormal Pap smears in past Extremities: extremities normal, atraumatic, no cyanosis or edema Pulses: 2+ and symmetric Skin: Skin color, texture, turgor normal. No rashes or lesions Lymph nodes: Cervical, supraclavicular, and axillary nodes normal. Neurologic: walks with walker due to pain in both hips  L>R   Assessment:    Healthy female exam.      Plan:    ghm utd Check labs  See After Visit Summary for Counseling Recommendations    1. DM (diabetes mellitus) type II, controlled, with peripheral vascular disorder ([place]) hgba1c to be checked, minimize simple carbs. Increase exercise as tolerated. Continue current meds  - Hemoglobin A1c - Comprehensive metabolic panel - Microalbumin / creatinine urine ratio  2. Essential hypertension Well controlled, no changes to meds. Encouraged heart healthy diet such as the DASH diet and exercise as tolerated.  - CBC with Differential/Platelet - Lipid panel - Microalbumin / creatinine urine ratio  3. Hyperlipidemia associated with type 2 diabetes mellitus ([place]) Tolerating statin, encouraged heart healthy diet, avoid trans fats, minimize simple carbs and saturated fats. Increase exercise as tolerated - Microalbumin / creatinine urine ratio  4. Osteopenia, unspecified location con't fosamax bmd - DG Bone Density; Future

## 2017-12-11 NOTE — Progress Notes (Signed)
Reviewed  Yvonne R Lowne Chase, DO  

## 2017-12-13 ENCOUNTER — Ambulatory Visit (INDEPENDENT_AMBULATORY_CARE_PROVIDER_SITE_OTHER): Payer: Medicare Other | Admitting: Orthopaedic Surgery

## 2017-12-14 ENCOUNTER — Ambulatory Visit (INDEPENDENT_AMBULATORY_CARE_PROVIDER_SITE_OTHER): Payer: Self-pay

## 2017-12-14 ENCOUNTER — Ambulatory Visit (INDEPENDENT_AMBULATORY_CARE_PROVIDER_SITE_OTHER): Payer: Medicare Other | Admitting: Orthopaedic Surgery

## 2017-12-14 ENCOUNTER — Encounter (INDEPENDENT_AMBULATORY_CARE_PROVIDER_SITE_OTHER): Payer: Self-pay | Admitting: Orthopaedic Surgery

## 2017-12-14 DIAGNOSIS — M1611 Unilateral primary osteoarthritis, right hip: Secondary | ICD-10-CM | POA: Diagnosis not present

## 2017-12-14 DIAGNOSIS — M25561 Pain in right knee: Secondary | ICD-10-CM

## 2017-12-14 DIAGNOSIS — M25551 Pain in right hip: Secondary | ICD-10-CM

## 2017-12-14 DIAGNOSIS — G8929 Other chronic pain: Secondary | ICD-10-CM

## 2017-12-14 LAB — PAIN MGMT, PROFILE 8 W/CONF, U
6 Acetylmorphine: NEGATIVE ng/mL (ref <10)
ALCOHOL METABOLITES: NEGATIVE ng/mL (ref <500)
AMPHETAMINES: NEGATIVE ng/mL (ref <500)
Benzodiazepines: NEGATIVE ng/mL (ref <100)
Buprenorphine, Urine: NEGATIVE ng/mL (ref <5)
Cocaine Metabolite: NEGATIVE ng/mL (ref <150)
Creatinine: 105 mg/dL
MDMA: NEGATIVE ng/mL (ref <500)
Marijuana Metabolite: NEGATIVE ng/mL (ref <20)
OPIATES: NEGATIVE ng/mL (ref <100)
OXIDANT: NEGATIVE ug/mL (ref <200)
OXYCODONE: NEGATIVE ng/mL (ref <100)
pH: 6.75 (ref 4.5–9.0)

## 2017-12-14 LAB — PAIN MGMT, TRAMADOL W/MEDMATCH, U
DESMETHYLTRAMADOL: 653 ng/mL — AB (ref <100)
Tramadol: 1671 ng/mL — ABNORMAL HIGH (ref <100)

## 2017-12-14 NOTE — Progress Notes (Signed)
Office Visit Note   Patient: [name]           Date of Birth: 01/04/1941           MRN: [id number] Visit Date: 12/14/2017              Requested by: [address] [place], [place] [address] [place], [place] [address] PCP: Carollee Herter, Alferd Apa, DO   Assessment & Plan: Visit Diagnoses:  1. Pain in right hip   2. Unilateral primary osteoarthritis, right hip   3. Chronic pain of right knee     Plan: I shared with her her right hip x-rays and we compared these to films from 2014 when I performed a left total hip arthroplasty.  Her right hip was normal at that time and on x-rays showed a normal-appearing hip.  However now her right hip is become severely degenerative and is deteriorating way.  I gave her a copy of the old x-rays and today's x-rays.  She is going to share these with her daughter.  I do feel that she should consider hip replacement surgery on the right side to help with her balance and coordination overall as well as strengthening in helping with any leg length discrepancies and deficits.  She is going to take this in the consideration.  I will reevaluate her in 4 weeks.  All question concerns were answered and addressed.  Follow-Up Instructions: Return in about 1 month (around 01/11/2018).   Orders:  Orders Placed This Encounter  Procedures  . XR Pelvis 1-2 Views   No orders of the defined types were placed in this encounter.     Procedures: No procedures performed   Clinical Data: No additional findings.   Subjective: Chief Complaint  Patient presents with  . Right Knee - Pain, Follow-up    S/p cortisone injection 10/18/17  The patient is following up for right knee pain after I provided a steroid injection in her right knee recently.  She said the steroid injection did not really help her at all.  She is someone that I did perform a left total hip arthroplasty on in 2014.  She also is been dealing with debilitating lumbar spine issues and what  she describes is nerve damage.  She does have a complex lumbar spine history with history of surgery in that area as well.  She does ambulate with a rolling walker and says that her right leg is quite shaky.  She wants me to listen to the grinding that is going on with her knee today as well.  This is on the right side that bothers her.  HPI  Review of Systems She currently denies any headache, chest pain, shortness of breath, fever, chills, nausea, vomiting.  Objective: Vital Signs: There were no vitals taken for this visit.  Physical Exam She is alert and oriented x3 and in no acute distress Ortho Exam Examination of her right knee today still shows well-maintained joint space and there is no significant patellofemoral crepitation.  However when I move her leg in certain directions I can actually feel significant grinding in the hip joint itself.  On examination of her right hip there is essentially almost no rotation of her hip at all and the grinding that she is feeling in her knee is actually radiating down from her hip.  On just clinical exam alone he can feel the bone-on-bone articulation of her right hip. Specialty Comments:  No  specialty comments available.  Imaging: Xr Pelvis 1-2 Views  Result Date: 12/14/2017 An AP pelvis and lateral of the right hip shows severe profound end-stage arthritis of the right hip.  The hip ball appears to be almost deteriorating away.  There is flattening of the head and loss of the joint space completely.  This is changed dramatically from films that are reviewed from 2014 showing the right hip.    PMFS History: Patient Active Problem List   Diagnosis Date Noted  . Unilateral primary osteoarthritis, right hip 12/14/2017  . Type 2 diabetes mellitus with diabetic neuropathy ([place]) 06/08/2017  . Neuropathic pain 03/30/2017  . Seasonal allergies 03/30/2017  . Lumbar scoliosis 08/12/2016  . Controlled diabetes mellitus type 2 with complications ([place])  29/47/6546  . Scoliosis 11/24/2015  . Low back pain 09/29/2014  . Diabetes mellitus with peripheral vascular disease ([place]) 04/10/2013  . Left carotid bruit 04/10/2013  . Anemia 03/31/2013  . Degenerative arthritis of hip 06/01/2012  . POSTMENOPAUSAL SYNDROME 09/16/2009  . ARTHRALGIA 09/16/2009  . Seasonal and perennial allergic rhinitis 10/08/2007  . OSTEOPENIA 08/06/2007  . ELEVATED BLOOD PRESSURE WITHOUT DIAGNOSIS OF HYPERTENSION 08/06/2007  . HYPERLIPIDEMIA 05/14/2007  . Asthma, mild intermittent 05/14/2007  . History of pulmonary embolus (PE) 08/31/2006   Past Medical History:  Diagnosis Date  . Anemia   . Arthritis   . Asthma    adult onset  . Basal cell cancer    LUE; Porokeratosis also  . Chronic kidney disease 1963   strep in kidney due to strep throat-hospitalized 10 days  . Complication of anesthesia    small trachea  . Diabetes mellitus 2010   A1c 6.7%  . DVT (deep venous thrombosis) ([place]) 2006   post immobilization post cns surgery  . GERD (gastroesophageal reflux disease)    very mild  . Granulomatous lung disease ([place]) 2002   incidental Xray finding  . Hyperlipidemia   . Hypertension 2004   Hypertensive response on Stress Test  . Paralysis ([facility]) 2006   post cervical fusion with spinal sac tear  with hematoma   . PTE (pulmonary thromboembolism) ([facility]) 2006    Family History  Problem Relation Age of Onset  . COPD Father        emphysema  . Cancer Mother        cns cancer  . Diabetes Sister        TWIN sister ; also Fibromyalgia ; S/P stent 2004  . Heart disease Sister        stents @ 68 & 70  . Stroke Maternal Grandmother        in  late 93s  . Transient ischemic attack Paternal Aunt     Past Surgical History:  Procedure Laterality Date  . ABDOMINAL EXPOSURE N/A 08/12/2016   Procedure: ABDOMINAL EXPOSURE;  Surgeon: Rosetta Posner, MD;  Location: [facility];  Service: Vascular;  Laterality: N/A;  . ANTERIOR LAT LUMBAR FUSION N/A 08/12/2016   Procedure:  LUMBAR TWO-THREE, LUMBAR THREE-FOUR, LUMBAR FOUR-FIVE  ANTEROLATERAL LUMBAR INTERBODY FUSION;  Surgeon: Erline Levine, MD;  Location: [facility];  Service: Neurosurgery;  Laterality: N/A;  L2-3 L3-4 L4-5 Anterolateral lumbar interbody fusion  . ANTERIOR LUMBAR FUSION N/A 08/12/2016   Procedure: Lumbar Five-Sacral One Anterior lumbar interbody fusion with Dr. Sherren Mocha Early to assist;  Surgeon: Erline Levine, MD;  Location: [facility];  Service: Neurosurgery;  Laterality: N/A;  L5-S1 Anterior lumbar interbody fusion with Dr. Sherren Mocha Early to assist  . BUNIONECTOMY    .  CERVICAL FUSION  2006   Dr Annette Stable, NS;post op hematoma & cns leak & urinary retention  . COLONOSCOPY  1992 & 2002   negative  . epidural steroids  2006   cervical spine  . LUMBAR PERCUTANEOUS PEDICLE SCREW 4 LEVEL N/A 08/12/2016   Procedure: LUMBAR TWO-SACRAL ONE Percuataneous Pedicle Screws;  Surgeon: Erline Levine, MD;  Location: [facility];  Service: Neurosurgery;  Laterality: N/A;  . ROTATOR CUFF REPAIR  2009   Right  . SEPTOPLASTY    . TONSILLECTOMY  77 years old  . TOTAL HIP ARTHROPLASTY  06/01/2012   Procedure: TOTAL HIP ARTHROPLASTY ANTERIOR APPROACH;  Surgeon: Mcarthur Rossetti, MD;  Location: [facility];  Service: Orthopedics;  Laterality: Left;  Left Total Hip Arthroplasty  . TUBAL LIGATION     Social History   Occupational History  . Not on file  Tobacco Use  . Smoking status: Never Smoker  . Smokeless tobacco: Never Used  Substance and Sexual Activity  . Alcohol use: Yes    Comment: twice a week; 2-4/week  . Drug use: No  . Sexual activity: Never

## 2017-12-20 ENCOUNTER — Other Ambulatory Visit: Payer: Self-pay | Admitting: *Deleted

## 2017-12-20 DIAGNOSIS — E785 Hyperlipidemia, unspecified: Secondary | ICD-10-CM

## 2017-12-20 DIAGNOSIS — E1169 Type 2 diabetes mellitus with other specified complication: Secondary | ICD-10-CM

## 2017-12-20 DIAGNOSIS — I10 Essential (primary) hypertension: Secondary | ICD-10-CM

## 2017-12-20 MED ORDER — EZETIMIBE 10 MG PO TABS: 10.0000 mg | ORAL_TABLET | Freq: Every day | ORAL | 2 refills | Status: DC

## 2017-12-21 ENCOUNTER — Telehealth (INDEPENDENT_AMBULATORY_CARE_PROVIDER_SITE_OTHER): Payer: Self-pay | Admitting: Orthopaedic Surgery

## 2017-12-21 NOTE — Telephone Encounter (Signed)
Called patient left message to return call [phone]

## 2017-12-21 NOTE — Telephone Encounter (Signed)
Returned call to patient left message for return call

## 2017-12-22 ENCOUNTER — Telehealth (INDEPENDENT_AMBULATORY_CARE_PROVIDER_SITE_OTHER): Payer: Self-pay | Admitting: Radiology

## 2017-12-22 ENCOUNTER — Other Ambulatory Visit (INDEPENDENT_AMBULATORY_CARE_PROVIDER_SITE_OTHER): Payer: Self-pay | Admitting: Orthopaedic Surgery

## 2017-12-22 MED ORDER — HYDROCODONE-ACETAMINOPHEN 5-325 MG PO TABS: 1.0000 | ORAL_TABLET | Freq: Four times a day (QID) | ORAL | 0 refills | Status: DC | PRN

## 2017-12-22 NOTE — Telephone Encounter (Signed)
I called patient and advised. 

## 2017-12-22 NOTE — Telephone Encounter (Signed)
I escribed in some hydrocodone.

## 2017-12-22 NOTE — Telephone Encounter (Signed)
Patient is calling she states that she is in a lot of pain with her left leg and she would like something called in for her for pain.  She uses [facility].  She does have an appointment with Benita Stabile on Monday to have leg evaluated. Can you have Dr. Lorin [name] to advise.

## 2017-12-22 NOTE — Telephone Encounter (Signed)
Please advise 

## 2017-12-25 ENCOUNTER — Ambulatory Visit (HOSPITAL_BASED_OUTPATIENT_CLINIC_OR_DEPARTMENT_OTHER)
Admission: RE | Admit: 2017-12-25 | Discharge: 2017-12-25 | Disposition: A | Discharge status: Home / Self Care | Payer: Medicare Other | Source: Ambulatory Visit | Attending: Physician Assistant | Admitting: Physician Assistant

## 2017-12-25 ENCOUNTER — Ambulatory Visit (INDEPENDENT_AMBULATORY_CARE_PROVIDER_SITE_OTHER): Payer: Medicare Other | Admitting: Physician Assistant

## 2017-12-25 ENCOUNTER — Ambulatory Visit (INDEPENDENT_AMBULATORY_CARE_PROVIDER_SITE_OTHER): Payer: Self-pay

## 2017-12-25 ENCOUNTER — Encounter (INDEPENDENT_AMBULATORY_CARE_PROVIDER_SITE_OTHER): Payer: Self-pay | Admitting: Physician Assistant

## 2017-12-25 ENCOUNTER — Telehealth: Payer: Self-pay | Admitting: Family Medicine

## 2017-12-25 DIAGNOSIS — E1151 Type 2 diabetes mellitus with diabetic peripheral angiopathy without gangrene: Secondary | ICD-10-CM | POA: Diagnosis not present

## 2017-12-25 DIAGNOSIS — Z7984 Long term (current) use of oral hypoglycemic drugs: Secondary | ICD-10-CM | POA: Diagnosis not present

## 2017-12-25 DIAGNOSIS — M79605 Pain in left leg: Secondary | ICD-10-CM

## 2017-12-25 DIAGNOSIS — Z7951 Long term (current) use of inhaled steroids: Secondary | ICD-10-CM | POA: Diagnosis not present

## 2017-12-25 DIAGNOSIS — Z833 Family history of diabetes mellitus: Secondary | ICD-10-CM | POA: Diagnosis not present

## 2017-12-25 DIAGNOSIS — Z79899 Other long term (current) drug therapy: Secondary | ICD-10-CM | POA: Diagnosis not present

## 2017-12-25 DIAGNOSIS — Z981 Arthrodesis status: Secondary | ICD-10-CM | POA: Diagnosis not present

## 2017-12-25 DIAGNOSIS — I10 Essential (primary) hypertension: Secondary | ICD-10-CM | POA: Diagnosis not present

## 2017-12-25 DIAGNOSIS — M8580 Other specified disorders of bone density and structure, unspecified site: Secondary | ICD-10-CM | POA: Diagnosis not present

## 2017-12-25 DIAGNOSIS — M25552 Pain in left hip: Secondary | ICD-10-CM | POA: Diagnosis not present

## 2017-12-25 DIAGNOSIS — E1140 Type 2 diabetes mellitus with diabetic neuropathy, unspecified: Secondary | ICD-10-CM | POA: Diagnosis not present

## 2017-12-25 DIAGNOSIS — J45909 Unspecified asthma, uncomplicated: Secondary | ICD-10-CM | POA: Diagnosis not present

## 2017-12-25 DIAGNOSIS — D649 Anemia, unspecified: Secondary | ICD-10-CM | POA: Diagnosis not present

## 2017-12-25 DIAGNOSIS — Z825 Family history of asthma and other chronic lower respiratory diseases: Secondary | ICD-10-CM | POA: Diagnosis not present

## 2017-12-25 DIAGNOSIS — Z888 Allergy status to other drugs, medicaments and biological substances status: Secondary | ICD-10-CM | POA: Diagnosis not present

## 2017-12-25 DIAGNOSIS — M1990 Unspecified osteoarthritis, unspecified site: Secondary | ICD-10-CM | POA: Diagnosis not present

## 2017-12-25 DIAGNOSIS — Z7983 Long term (current) use of bisphosphonates: Secondary | ICD-10-CM | POA: Diagnosis not present

## 2017-12-25 DIAGNOSIS — Z471 Aftercare following joint replacement surgery: Secondary | ICD-10-CM | POA: Diagnosis not present

## 2017-12-25 DIAGNOSIS — R0609 Other forms of dyspnea: Secondary | ICD-10-CM

## 2017-12-25 DIAGNOSIS — Z96642 Presence of left artificial hip joint: Secondary | ICD-10-CM | POA: Diagnosis not present

## 2017-12-25 DIAGNOSIS — N3281 Overactive bladder: Secondary | ICD-10-CM | POA: Diagnosis not present

## 2017-12-25 DIAGNOSIS — Z66 Do not resuscitate: Secondary | ICD-10-CM | POA: Diagnosis not present

## 2017-12-25 DIAGNOSIS — L03116 Cellulitis of left lower limb: Secondary | ICD-10-CM | POA: Diagnosis not present

## 2017-12-25 DIAGNOSIS — E785 Hyperlipidemia, unspecified: Secondary | ICD-10-CM | POA: Diagnosis not present

## 2017-12-25 MED ORDER — CEPHALEXIN 500 MG PO CAPS: 500.0000 mg | ORAL_CAPSULE | Freq: Two times a day (BID) | ORAL | 0 refills | Status: DC

## 2017-12-25 MED ORDER — HYDROCODONE-ACETAMINOPHEN 5-325 MG PO TABS: 1.0000 | ORAL_TABLET | Freq: Four times a day (QID) | ORAL | 0 refills | Status: DC | PRN

## 2017-12-25 NOTE — Progress Notes (Signed)
LLE venous duplex prelim: negative for DVT. Attempted call report to Erskine Emery with no answer at number given Landry Mellow, RDMS, RVT

## 2017-12-25 NOTE — Telephone Encounter (Signed)
Copied from [place] [id number]. Topic: Quick Communication - See Telephone Encounter >> Dec 25, 2017  4:24 PM Robina Ade, Helene Kelp D wrote: CRM for notification. See Telephone encounter for: 12/25/17. Mliss Sax with [facility] called requesting transfer of care for pt to their facility. They would like to talk to someone regarding this, she can be reached at [phone].

## 2017-12-25 NOTE — Progress Notes (Signed)
Office Visit Note   Patient: [name]           Date of Birth: [birth year]           MRN: [id number] Visit Date: 12/25/2017              Requested by: [address] [place], [place] [address] [place], [place] [address] PCP: Carollee Herter, Alferd Apa, DO   Assessment & Plan: Visit Diagnoses:  1. Pain in left leg     Plan: We will obtain a Doppler of her left lower leg to rule out DVT.  Placed her on Keflex twice daily for 10 days.  Skilled hydrocodone for pain.  Will likely have her follow-up after right total hip arthroplasty.  However if she does have a DVT she will have to go on anticoagulation for 3 to 6 months and will have to reschedule her right total hip arthroplasty.  Follow-Up Instructions: Return if symptoms worsen or fail to improve.   Orders:  Orders Placed This Encounter  Procedures  . XR Pelvis 1-2 Views   Meds ordered this encounter  Medications  . cephALEXin (KEFLEX) 500 MG capsule    Sig: Take 1 capsule (500 mg total) by mouth 2 (two) times daily.    Dispense:  20 capsule    Refill:  0  . HYDROcodone-acetaminophen (NORCO/VICODIN) 5-325 MG tablet    Sig: Take 1-2 tablets by mouth every 6 (six) hours as needed for moderate pain.    Dispense:  30 tablet    Refill:  0      Procedures: No procedures performed   Clinical Data: No additional findings.   Subjective: Chief Complaint  Patient presents with  . Left Leg - Pain  . Left Hip - Pain    HPI [name] is well-known to Dr. Ninfa Linden service is just recently seen by him on 12/14/2017 and was found to have severe osteoarthritis of her right hip.  She is in the process of setting up surgery for right total hip arthroplasty.  However on 11/24/2017 she developed left hip pain while in exercise class.  Then on 11/28/2017 she injured the hip again while trying to get out of the car.  She had an injection sounds like for trochanteric pain in the left hip on July 11 pain became much worse and then  by 12/16/2017 she was able unable to walk having severe pain swelling redness of the left leg.  Over the weekend she is developed significant swelling and pain in her left lower leg tracking up into posterior left thigh.  She is had no chest pain shortness of breath fevers chills.  She does have a history of DVT.  She also has history of basal cell cancer left lower leg and porokeratosis. Review of Systems Please see HPI otherwise negative  Objective: Vital Signs: There were no vitals taken for this visit.  Physical Exam  Constitutional: She appears well-developed. No distress.  Neurological: She is alert.  Skin: She is not diaphoretic.    Ortho Exam Left lower extremity tenderness and calf and significant edema of the left leg and thigh compared to the right.  She has significant erythema what appears to be an early cellulitis of both lower legs.  There is appeared to be due to excoriation however could be due to her porokeratosis. Decreased range of motion of the left hip tenderness over the trochanteric region.  Specialty Comments:  No specialty comments available.  Imaging: Xr Pelvis 1-2 Views  Result Date: 12/25/2017  AP pelvis: Well-seated left total hip components.  Hips well located.  No acute fracture.  Right hip with severe end-stage osteoarthritis of the right hip joint.    PMFS History: Patient Active Problem List   Diagnosis Date Noted  . Unilateral primary osteoarthritis, right hip 12/14/2017  . Type 2 diabetes mellitus with diabetic neuropathy ([facility]) 06/08/2017  . Neuropathic pain 03/30/2017  . Seasonal allergies 03/30/2017  . Lumbar scoliosis 08/12/2016  . Controlled diabetes mellitus type 2 with complications ([facility]) 94/49/6759  . Scoliosis 11/24/2015  . Low back pain 09/29/2014  . Diabetes mellitus with peripheral vascular disease ([facility]) 04/10/2013  . Left carotid bruit 04/10/2013  . Anemia 03/31/2013  . Degenerative arthritis of hip 06/01/2012  . POSTMENOPAUSAL  SYNDROME 09/16/2009  . ARTHRALGIA 09/16/2009  . Seasonal and perennial allergic rhinitis 10/08/2007  . OSTEOPENIA 08/06/2007  . ELEVATED BLOOD PRESSURE WITHOUT DIAGNOSIS OF HYPERTENSION 08/06/2007  . HYPERLIPIDEMIA 05/14/2007  . Asthma, mild intermittent 05/14/2007  . History of pulmonary embolus (PE) 08/31/2006   Past Medical History:  Diagnosis Date  . Anemia   . Arthritis   . Asthma    adult onset  . Basal cell cancer    LUE; Porokeratosis also  . Chronic kidney disease 1963   strep in kidney due to strep throat-hospitalized 10 days  . Complication of anesthesia    small trachea  . Diabetes mellitus 2010   A1c 6.7%  . DVT (deep venous thrombosis) ([place] [place]) 2006   post immobilization post cns surgery  . GERD (gastroesophageal reflux disease)    very mild  . Granulomatous lung disease ([place]) 2002   incidental Xray finding  . Hyperlipidemia   . Hypertension 2004   Hypertensive response on Stress Test  . Paralysis ([facility]) 2006   post cervical fusion with spinal sac tear  with hematoma   . PTE (pulmonary thromboembolism) ([place]) 2006    Family History  Problem Relation Age of Onset  . COPD Father        emphysema  . Cancer Mother        cns cancer  . Diabetes Sister        TWIN sister ; also Fibromyalgia ; S/P stent 2004  . Heart disease Sister        stents @ 66 & 61  . Stroke Maternal Grandmother        in  late 43s  . Transient ischemic attack Paternal Aunt     Past Surgical History:  Procedure Laterality Date  . ABDOMINAL EXPOSURE N/A 08/12/2016   Procedure: ABDOMINAL EXPOSURE;  Surgeon: Rosetta Posner, MD;  Location: [facility];  Service: Vascular;  Laterality: N/A;  . ANTERIOR LAT LUMBAR FUSION N/A 08/12/2016   Procedure: LUMBAR TWO-THREE, LUMBAR THREE-FOUR, LUMBAR FOUR-FIVE  ANTEROLATERAL LUMBAR INTERBODY FUSION;  Surgeon: Erline Levine, MD;  Location: [facility];  Service: Neurosurgery;  Laterality: N/A;  L2-3 L3-4 L4-5 Anterolateral lumbar interbody fusion  . ANTERIOR LUMBAR  FUSION N/A 08/12/2016   Procedure: Lumbar Five-Sacral One Anterior lumbar interbody fusion with Dr. Sherren Mocha Early to assist;  Surgeon: Erline Levine, MD;  Location: [facility];  Service: Neurosurgery;  Laterality: N/A;  L5-S1 Anterior lumbar interbody fusion with Dr. Sherren Mocha Early to assist  . BUNIONECTOMY    . CERVICAL FUSION  2006   Dr Annette Stable, NS;post op hematoma & cns leak & urinary retention  . COLONOSCOPY  1992 & 2002   negative  . epidural steroids  2006   cervical spine  . LUMBAR PERCUTANEOUS PEDICLE SCREW 4 LEVEL N/A 08/12/2016   Procedure: LUMBAR TWO-SACRAL ONE Percuataneous Pedicle Screws;  Surgeon: Erline Levine, MD;  Location: [facility];  Service: Neurosurgery;  Laterality: N/A;  . ROTATOR CUFF REPAIR  2009   Right  . SEPTOPLASTY    . TONSILLECTOMY  77 years old  . TOTAL HIP ARTHROPLASTY  06/01/2012   Procedure: TOTAL HIP ARTHROPLASTY ANTERIOR APPROACH;  Surgeon: Mcarthur Rossetti, MD;  Location[facility];  Service: Orthopedics;  Laterality: Left;  Left Total Hip Arthroplasty  . TUBAL LIGATION     Social History   Occupational History  . Not on file  Tobacco Use  . Smoking status: Never Smoker  . Smokeless tobacco: Never Used  Substance and Sexual Activity  . Alcohol use: Yes    Comment: twice a week; 2-4/week  . Drug use: No  . Sexual activity: Never

## 2017-12-25 NOTE — Telephone Encounter (Signed)
Author phoned [facility] but no answer, closed for the day. Will try again later.

## 2017-12-26 NOTE — Telephone Encounter (Signed)
Pt called in to make PCP aware that she is going to a rehab facility for her hip.

## 2017-12-26 NOTE — Telephone Encounter (Signed)
FYI

## 2017-12-27 ENCOUNTER — Ambulatory Visit (INDEPENDENT_AMBULATORY_CARE_PROVIDER_SITE_OTHER): Payer: Medicare Other | Admitting: Orthopaedic Surgery

## 2017-12-27 ENCOUNTER — Other Ambulatory Visit (INDEPENDENT_AMBULATORY_CARE_PROVIDER_SITE_OTHER): Payer: Self-pay | Admitting: Physician Assistant

## 2017-12-27 ENCOUNTER — Telehealth: Payer: Self-pay

## 2017-12-27 ENCOUNTER — Other Ambulatory Visit: Payer: Self-pay | Admitting: Family Medicine

## 2017-12-27 ENCOUNTER — Telehealth (INDEPENDENT_AMBULATORY_CARE_PROVIDER_SITE_OTHER): Payer: Self-pay | Admitting: Orthopaedic Surgery

## 2017-12-27 DIAGNOSIS — N3281 Overactive bladder: Secondary | ICD-10-CM

## 2017-12-27 NOTE — Telephone Encounter (Signed)
Copied from [place] [phone]. Topic: General - Other >> Dec 27, 2017  2:19 PM Yvette Rack wrote: Reason for CRM:  Mliss Sax from [facility] [phone] calling to see if fax has been received from this morning please give her a call back when its been received

## 2017-12-27 NOTE — Telephone Encounter (Signed)
Patient called asked if she can be scheduled for an MRI for her left leg. The number to contact patient is [phone]

## 2017-12-27 NOTE — Telephone Encounter (Signed)
Please advise 

## 2017-12-27 NOTE — Telephone Encounter (Signed)
See below

## 2017-12-27 NOTE — Telephone Encounter (Signed)
please advise.

## 2017-12-27 NOTE — Telephone Encounter (Signed)
Send this note to gil since he examined her last to see what he thinks we should MRI.

## 2017-12-28 ENCOUNTER — Other Ambulatory Visit: Payer: Self-pay

## 2017-12-28 ENCOUNTER — Emergency Department (HOSPITAL_COMMUNITY): Payer: Medicare Other

## 2017-12-28 ENCOUNTER — Encounter (HOSPITAL_COMMUNITY): Payer: Self-pay

## 2017-12-28 ENCOUNTER — Inpatient Hospital Stay (HOSPITAL_COMMUNITY)
Admission: EM | Admit: 2017-12-28 | Discharge: 2017-12-31 | DRG: 603 | Disposition: A | Discharge status: Home Health | Payer: Medicare Other | Attending: Internal Medicine | Admitting: Internal Medicine

## 2017-12-28 DIAGNOSIS — M25552 Pain in left hip: Secondary | ICD-10-CM | POA: Diagnosis not present

## 2017-12-28 DIAGNOSIS — Z7983 Long term (current) use of bisphosphonates: Secondary | ICD-10-CM | POA: Diagnosis not present

## 2017-12-28 DIAGNOSIS — Z96642 Presence of left artificial hip joint: Secondary | ICD-10-CM | POA: Diagnosis present

## 2017-12-28 DIAGNOSIS — L0390 Cellulitis, unspecified: Secondary | ICD-10-CM | POA: Diagnosis present

## 2017-12-28 DIAGNOSIS — Z66 Do not resuscitate: Secondary | ICD-10-CM | POA: Diagnosis present

## 2017-12-28 DIAGNOSIS — M1990 Unspecified osteoarthritis, unspecified site: Secondary | ICD-10-CM | POA: Diagnosis present

## 2017-12-28 DIAGNOSIS — I10 Essential (primary) hypertension: Secondary | ICD-10-CM | POA: Diagnosis present

## 2017-12-28 DIAGNOSIS — Z833 Family history of diabetes mellitus: Secondary | ICD-10-CM

## 2017-12-28 DIAGNOSIS — N3281 Overactive bladder: Secondary | ICD-10-CM | POA: Diagnosis present

## 2017-12-28 DIAGNOSIS — M8580 Other specified disorders of bone density and structure, unspecified site: Secondary | ICD-10-CM | POA: Diagnosis present

## 2017-12-28 DIAGNOSIS — E1151 Type 2 diabetes mellitus with diabetic peripheral angiopathy without gangrene: Secondary | ICD-10-CM | POA: Diagnosis present

## 2017-12-28 DIAGNOSIS — Z888 Allergy status to other drugs, medicaments and biological substances status: Secondary | ICD-10-CM | POA: Diagnosis not present

## 2017-12-28 DIAGNOSIS — E1140 Type 2 diabetes mellitus with diabetic neuropathy, unspecified: Secondary | ICD-10-CM | POA: Diagnosis present

## 2017-12-28 DIAGNOSIS — L03116 Cellulitis of left lower limb: Secondary | ICD-10-CM | POA: Diagnosis not present

## 2017-12-28 DIAGNOSIS — J45909 Unspecified asthma, uncomplicated: Secondary | ICD-10-CM | POA: Diagnosis present

## 2017-12-28 DIAGNOSIS — M79605 Pain in left leg: Secondary | ICD-10-CM | POA: Diagnosis present

## 2017-12-28 DIAGNOSIS — E785 Hyperlipidemia, unspecified: Secondary | ICD-10-CM | POA: Diagnosis present

## 2017-12-28 DIAGNOSIS — Z7951 Long term (current) use of inhaled steroids: Secondary | ICD-10-CM | POA: Diagnosis not present

## 2017-12-28 DIAGNOSIS — Z825 Family history of asthma and other chronic lower respiratory diseases: Secondary | ICD-10-CM

## 2017-12-28 DIAGNOSIS — D649 Anemia, unspecified: Secondary | ICD-10-CM | POA: Diagnosis present

## 2017-12-28 DIAGNOSIS — Z981 Arthrodesis status: Secondary | ICD-10-CM

## 2017-12-28 DIAGNOSIS — Z7984 Long term (current) use of oral hypoglycemic drugs: Secondary | ICD-10-CM

## 2017-12-28 DIAGNOSIS — Z471 Aftercare following joint replacement surgery: Secondary | ICD-10-CM | POA: Diagnosis not present

## 2017-12-28 DIAGNOSIS — Z79899 Other long term (current) drug therapy: Secondary | ICD-10-CM | POA: Diagnosis not present

## 2017-12-28 DIAGNOSIS — Z789 Other specified health status: Secondary | ICD-10-CM

## 2017-12-28 LAB — CBC WITH DIFFERENTIAL/PLATELET
BASOS ABS: 0 10*3/uL (ref 0.0–0.1)
Basophils Relative: 0 %
EOS PCT: 2 %
Eosinophils Absolute: 0.1 10*3/uL (ref 0.0–0.7)
HEMATOCRIT: 34.7 % — AB (ref 36.0–46.0)
HEMOGLOBIN: 11.2 g/dL — AB (ref 12.0–15.0)
LYMPHS ABS: 1.6 10*3/uL (ref 0.7–4.0)
LYMPHS PCT: 22 %
MCH: 28.6 pg (ref 26.0–34.0)
MCHC: 32.3 g/dL (ref 30.0–36.0)
MCV: 88.5 fL (ref 78.0–100.0)
Monocytes Absolute: 0.6 10*3/uL (ref 0.1–1.0)
Monocytes Relative: 9 %
NEUTROS ABS: 4.7 10*3/uL (ref 1.7–7.7)
Neutrophils Relative %: 67 %
Platelets: 239 10*3/uL (ref 150–400)
RBC: 3.92 MIL/uL (ref 3.87–5.11)
RDW: 15 % (ref 11.5–15.5)
WBC: 7 10*3/uL (ref 4.0–10.5)

## 2017-12-28 LAB — GLUCOSE, CAPILLARY: GLUCOSE-CAPILLARY: 139 mg/dL — AB (ref 70–99)

## 2017-12-28 LAB — I-STAT CG4 LACTIC ACID, ED
LACTIC ACID, VENOUS: 0.56 mmol/L (ref 0.5–1.9)
Lactic Acid, Venous: 0.94 mmol/L (ref 0.5–1.9)

## 2017-12-28 LAB — COMPREHENSIVE METABOLIC PANEL
ALBUMIN: 4.2 g/dL (ref 3.5–5.0)
ALT: 24 U/L (ref 0–44)
ANION GAP: 8 (ref 5–15)
AST: 28 U/L (ref 15–41)
Alkaline Phosphatase: 57 U/L (ref 38–126)
BUN: 12 mg/dL (ref 8–23)
CHLORIDE: 109 mmol/L (ref 98–111)
CO2: 24 mmol/L (ref 22–32)
Calcium: 9.5 mg/dL (ref 8.9–10.3)
Creatinine, Ser: 0.92 mg/dL (ref 0.44–1.00)
GFR calc Af Amer: >60 mL/min (ref >60)
GFR, EST NON AFRICAN AMERICAN: 59 mL/min — AB (ref >60)
GLUCOSE: 102 mg/dL — AB (ref 70–99)
POTASSIUM: 4.1 mmol/L (ref 3.5–5.1)
Sodium: 141 mmol/L (ref 135–145)
Total Bilirubin: 0.7 mg/dL (ref 0.3–1.2)
Total Protein: 7.5 g/dL (ref 6.5–8.1)

## 2017-12-28 LAB — URINALYSIS, ROUTINE W REFLEX MICROSCOPIC
Bilirubin Urine: NEGATIVE
Glucose, UA: NEGATIVE mg/dL
HGB URINE DIPSTICK: NEGATIVE
Ketones, ur: 5 mg/dL — AB
Leukocytes, UA: NEGATIVE
Nitrite: NEGATIVE
PROTEIN: NEGATIVE mg/dL
SPECIFIC GRAVITY, URINE: 1.003 — AB (ref 1.005–1.030)
pH: 7 (ref 5.0–8.0)

## 2017-12-28 MED ORDER — EZETIMIBE 10 MG PO TABS
10.0000 mg | ORAL_TABLET | Freq: Every day | ORAL | Status: DC
  Administered 2017-12-29 – 2017-12-31 (×3): 10 mg via ORAL
  Filled 2017-12-28 (×3): qty 1

## 2017-12-28 MED ORDER — HYDROCODONE-ACETAMINOPHEN 5-325 MG PO TABS
1.0000 | ORAL_TABLET | Freq: Four times a day (QID) | ORAL | Status: DC | PRN
  Administered 2017-12-28 – 2017-12-30 (×6): 2 via ORAL
  Filled 2017-12-28 (×6): qty 2

## 2017-12-28 MED ORDER — MORPHINE SULFATE (PF) 4 MG/ML IV SOLN
4.0000 mg | Freq: Once | INTRAVENOUS | Status: AC
  Administered 2017-12-28: 4 mg via INTRAVENOUS
  Filled 2017-12-28: qty 1

## 2017-12-28 MED ORDER — POLYETHYLENE GLYCOL 3350 17 G PO PACK
17.0000 g | PACK | Freq: Every day | ORAL | Status: DC
  Administered 2017-12-29: 17 g via ORAL
  Filled 2017-12-28 (×3): qty 1

## 2017-12-28 MED ORDER — ALBUTEROL SULFATE (2.5 MG/3ML) 0.083% IN NEBU: 2.5000 mg | INHALATION_SOLUTION | Freq: Four times a day (QID) | RESPIRATORY_TRACT | Status: DC | PRN

## 2017-12-28 MED ORDER — VANCOMYCIN HCL IN DEXTROSE 1-5 GM/200ML-% IV SOLN
1000.0000 mg | Freq: Once | INTRAVENOUS | Status: AC
  Administered 2017-12-28: 1000 mg via INTRAVENOUS
  Filled 2017-12-28: qty 200

## 2017-12-28 MED ORDER — HYPROMELLOSE (GONIOSCOPIC) 2.5 % OP SOLN
1.0000 [drp] | Freq: Every day | OPHTHALMIC | Status: DC | PRN
  Filled 2017-12-28: qty 15

## 2017-12-28 MED ORDER — ONDANSETRON HCL 4 MG/2ML IJ SOLN: 4.0000 mg | Freq: Four times a day (QID) | INTRAMUSCULAR | Status: DC | PRN

## 2017-12-28 MED ORDER — ENOXAPARIN SODIUM 40 MG/0.4ML ~~LOC~~ SOLN
40.0000 mg | SUBCUTANEOUS | Status: DC
  Administered 2017-12-29 – 2017-12-30 (×3): 40 mg via SUBCUTANEOUS
  Filled 2017-12-28 (×3): qty 0.4

## 2017-12-28 MED ORDER — CEFAZOLIN SODIUM-DEXTROSE 1-4 GM/50ML-% IV SOLN
1.0000 g | Freq: Once | INTRAVENOUS | Status: AC
  Administered 2017-12-28: 1 g via INTRAVENOUS
  Filled 2017-12-28: qty 50

## 2017-12-28 MED ORDER — POLYETHYL GLYCOL-PROPYL GLYCOL 0.4-0.3 % OP GEL: Freq: Every day | OPHTHALMIC | Status: DC | PRN

## 2017-12-28 MED ORDER — PREGABALIN 50 MG PO CAPS
100.0000 mg | ORAL_CAPSULE | Freq: Three times a day (TID) | ORAL | Status: DC
  Administered 2017-12-28 – 2017-12-31 (×8): 100 mg via ORAL
  Filled 2017-12-28 (×8): qty 2

## 2017-12-28 MED ORDER — ONDANSETRON HCL 4 MG PO TABS: 4.0000 mg | ORAL_TABLET | Freq: Four times a day (QID) | ORAL | Status: DC | PRN

## 2017-12-28 MED ORDER — MOMETASONE FURO-FORMOTEROL FUM 200-5 MCG/ACT IN AERO
2.0000 | INHALATION_SPRAY | Freq: Two times a day (BID) | RESPIRATORY_TRACT | Status: DC
  Administered 2017-12-28 – 2017-12-31 (×6): 2 via RESPIRATORY_TRACT
  Filled 2017-12-28: qty 8.8

## 2017-12-28 MED ORDER — ONDANSETRON HCL 4 MG/2ML IJ SOLN
4.0000 mg | Freq: Once | INTRAMUSCULAR | Status: AC
  Administered 2017-12-28: 4 mg via INTRAVENOUS
  Filled 2017-12-28: qty 2

## 2017-12-28 MED ORDER — CEFAZOLIN SODIUM-DEXTROSE 1-4 GM/50ML-% IV SOLN
1.0000 g | Freq: Three times a day (TID) | INTRAVENOUS | Status: DC
  Administered 2017-12-29 – 2017-12-31 (×8): 1 g via INTRAVENOUS
  Filled 2017-12-28 (×10): qty 50

## 2017-12-28 MED ORDER — TROLAMINE SALICYLATE 10 % EX LOTN: TOPICAL_LOTION | Freq: Every day | CUTANEOUS | Status: DC | PRN

## 2017-12-28 MED ORDER — INSULIN ASPART 100 UNIT/ML ~~LOC~~ SOLN: 0.0000 [IU] | Freq: Every day | SUBCUTANEOUS | Status: DC

## 2017-12-28 MED ORDER — INSULIN ASPART 100 UNIT/ML ~~LOC~~ SOLN
0.0000 [IU] | Freq: Three times a day (TID) | SUBCUTANEOUS | Status: DC
  Administered 2017-12-29: 3 [IU] via SUBCUTANEOUS

## 2017-12-28 MED ORDER — ALBUTEROL SULFATE HFA 108 (90 BASE) MCG/ACT IN AERS: 2.0000 | INHALATION_SPRAY | Freq: Four times a day (QID) | RESPIRATORY_TRACT | Status: DC | PRN

## 2017-12-28 MED ORDER — TROLAMINE SALICYLATE 10 % EX CREA
TOPICAL_CREAM | Freq: Every day | CUTANEOUS | Status: DC | PRN
  Filled 2017-12-28 (×2): qty 85

## 2017-12-28 MED ORDER — MIRABEGRON ER 25 MG PO TB24
50.0000 mg | ORAL_TABLET | Freq: Every day | ORAL | Status: DC
  Administered 2017-12-29 – 2017-12-31 (×3): 50 mg via ORAL
  Filled 2017-12-28 (×3): qty 2

## 2017-12-28 NOTE — Telephone Encounter (Signed)
Author spoke with Mliss Sax from [facility], confirming receipt of ppwk. Ppwk currently in Dr. Nonda Lou possession. Will fax to [facility] once reviewed by Dr. Etter Sjogren.

## 2017-12-28 NOTE — ED Notes (Signed)
ED TO INPATIENT HANDOFF REPORT  Name/Age/Gender [name] 77 y.o. female  Code Status Code Status History    Date Active Date Inactive Code Status Order ID Comments User Context   08/12/2016 1812 08/16/2016 1528 Full Code [id number]  Erline Levine, MD Inpatient   06/01/2012 1607 06/04/2012 1444 Full Code [id number]  Arletha Pili, RN Inpatient    Advance Directive Documentation     Most Recent Value  Type of Advance Directive  Healthcare Power of Attorney, Living will  Pre-existing out of facility DNR order (yellow form or pink MOST form)  -  "MOST" Form in Place?  -      Home/SNF/Other Home  Chief Complaint Lf Leg Pain  Level of Care/Admitting Diagnosis ED Disposition    ED Disposition Condition [place]: [facility] [100102]  Level of Care: Med-Surg [16]  Diagnosis: Cellulitis [341937]  Admitting Physician: Elodia Florence [phone]  Attending Physician: Cephus Slater, A CALDWELL [phone]  Estimated length of stay: past midnight tomorrow  Certification:: I certify this patient will need inpatient services for at least 2 midnights  PT Class (Do Not Modify): Inpatient [101]  PT Acc Code (Do Not Modify): Private [1]       Medical History Past Medical History:  Diagnosis Date  . Anemia   . Arthritis   . Asthma    adult onset  . Basal cell cancer    LUE; Porokeratosis also  . Chronic kidney disease 1963   strep in kidney due to strep throat-hospitalized 10 days  . Complication of anesthesia    small trachea  . Diabetes mellitus 2010   A1c 6.7%  . DVT (deep venous thrombosis) ([place]) 2006   post immobilization post cns surgery  . GERD (gastroesophageal reflux disease)    very mild  . Granulomatous lung disease ([place]) 2002   incidental Xray finding  . Hyperlipidemia   . Hypertension 2004   Hypertensive response on Stress Test  . Paralysis ([place]) 2006   post cervical fusion with spinal sac tear  with hematoma   . PTE  (pulmonary thromboembolism) ([place]) 2006    Allergies Allergies  Allergen Reactions  . Statins Other (See Comments)    Myalgias and muscle weakness    IV Location/Drains/Wounds Patient Lines/Drains/Airways Status   Active Line/Drains/Airways    Name:   Placement date:   Placement time:   Site:   Days:   Incision 06/01/12 Hip Left   06/01/12    1429     2036   Incision (Closed) 08/12/16 Abdomen Left;Lower   08/12/16    1131     503   Incision (Closed) 08/12/16 Flank Left;Lateral   08/12/16    1337     503   Incision (Closed) 08/12/16 Back Other (Comment)   08/12/16    1533     503          Labs/Imaging Results for orders placed or performed during the hospital encounter of 12/28/17 (from the past 48 hour(s))  Comprehensive metabolic panel     Status: Abnormal   Collection Time: 12/28/17  1:28 PM  Result Value Ref Range   Sodium 141 135 - 145 mmol/L   Potassium 4.1 3.5 - 5.1 mmol/L   Chloride 109 98 - 111 mmol/L   CO2 24 22 - 32 mmol/L   Glucose, Bld 102 (H) 70 - 99 mg/dL   BUN 12 8 - 23 mg/dL   Creatinine, Ser  0.92 0.44 - 1.00 mg/dL   Calcium 9.5 8.9 - 10.3 mg/dL   Total Protein 7.5 6.5 - 8.1 g/dL   Albumin 4.2 3.5 - 5.0 g/dL   AST 28 15 - 41 U/L   ALT 24 0 - 44 U/L   Alkaline Phosphatase 57 38 - 126 U/L   Total Bilirubin 0.7 0.3 - 1.2 mg/dL   GFR calc non Af Amer 59 (L) >60 mL/min   GFR calc Af Amer >60 >60 mL/min    Comment: (NOTE) The eGFR has been calculated using the CKD EPI equation. This calculation has not been validated in all clinical situations. eGFR's persistently <60 mL/min signify possible Chronic Kidney Disease.    Anion gap 8 5 - 15    Comment: Performed at [facility], [place] [address] [place], [place] [address]  Urinalysis, Routine w reflex microscopic     Status: Abnormal   Collection Time: 12/28/17  1:28 PM  Result Value Ref Range   Color, Urine COLORLESS (A) YELLOW   APPearance CLEAR CLEAR   Specific Gravity, Urine 1.003  (L) 1.005 - 1.030   pH 7.0 5.0 - 8.0   Glucose, UA NEGATIVE NEGATIVE mg/dL   Hgb urine dipstick NEGATIVE NEGATIVE   Bilirubin Urine NEGATIVE NEGATIVE   Ketones, ur 5 (A) NEGATIVE mg/dL   Protein, ur NEGATIVE NEGATIVE mg/dL   Nitrite NEGATIVE NEGATIVE   Leukocytes, UA NEGATIVE NEGATIVE    Comment: Performed at [facility] [address], [place], [place] [address]  CBC with Differential     Status: Abnormal   Collection Time: 12/28/17  1:28 PM  Result Value Ref Range   WBC 7.0 4.0 - 10.5 K/uL   RBC 3.92 3.87 - 5.11 MIL/uL   Hemoglobin 11.2 (L) 12.0 - 15.0 g/dL   HCT 34.7 (L) 36.0 - 46.0 %   MCV 88.5 78.0 - 100.0 fL   MCH 28.6 26.0 - 34.0 pg   MCHC 32.3 30.0 - 36.0 g/dL   RDW 15.0 11.5 - 15.5 %   Platelets 239 150 - 400 K/uL   Neutrophils Relative % 67 %   Neutro Abs 4.7 1.7 - 7.7 K/uL   Lymphocytes Relative 22 %   Lymphs Abs 1.6 0.7 - 4.0 K/uL   Monocytes Relative 9 %   Monocytes Absolute 0.6 0.1 - 1.0 K/uL   Eosinophils Relative 2 %   Eosinophils Absolute 0.1 0.0 - 0.7 K/uL   Basophils Relative 0 %   Basophils Absolute 0.0 0.0 - 0.1 K/uL    Comment: Performed at [facility], [place] [address] [place], [place] [address]  I-Stat CG4 Lactic Acid, ED     Status: None   Collection Time: 12/28/17  2:41 PM  Result Value Ref Range   Lactic Acid, Venous 0.94 0.5 - 1.9 mmol/L  I-Stat CG4 Lactic Acid, ED     Status: None   Collection Time: 12/28/17  6:03 PM  Result Value Ref Range   Lactic Acid, Venous 0.56 0.5 - 1.9 mmol/L   Ct Pelvis Wo Contrast  Result Date: 12/28/2017 CLINICAL DATA:  Left leg pain and left hip pain. Injured while opening a car door on July 2nd. History of total left hip arthroplasty and back surgery. EXAM: CT PELVIS WITHOUT CONTRAST TECHNIQUE: Multidetector CT imaging of the pelvis was performed following the standard protocol without intravenous contrast. COMPARISON:  Radiographs 12/25/2017 FINDINGS: The left total hip  arthroplasty is well seated. No complicating features are identified.  No evidence of loosening or periprosthetic fracture. No findings suspicious for particle disease or metallosis. Mild to moderate subcutaneous soft tissue swelling/edema/fluid involving the left upper thigh along with mild skin thickening is suspicious for cellulitis. No discrete fluid collection to suggest an abscess. No obvious findings for myofasciitis or pyomyositis. No hematoma or mass. The pubic symphysis and SI joints are intact. Mild degenerative changes. Extensive surgical changes involving the lower lumbar spine without obvious complicating features. Severe degenerative disease involving the right hip with joint space narrowing, bone-on-bone appearance, osteophytic spurring and subchondral cystic change. No fracture or AVN. No significant intrapelvic abnormalities are identified. No mass or adenopathy. The uterus and ovaries appear normal. The bladder is grossly normal. No inguinal mass or inguinal hernia. Small scattered inguinal lymph nodes. IMPRESSION: 1. Findings suggest cellulitis involving the left thigh without discrete drainable abscess, myofasciitis or pyomyositis. 2. Well seated components of a total left hip arthroplasty without complicating features. 3. No significant intrapelvic abnormalities are identified. 4. Severe right hip joint degenerative changes. Electronically Signed   By: Marijo Sanes M.D.   On: 12/28/2017 15:46    Pending Labs Unresulted Labs (From admission, onward)   Start     Ordered   12/28/17 1328  Culture, blood (routine x 2)  BLOOD CULTURE X 2,   STAT     12/28/17 1329   Signed and Held  CBC  (enoxaparin (LOVENOX)    CrCl >/= 30 ml/min)  Once,   R    Comments:  Baseline for enoxaparin therapy IF NOT ALREADY DRAWN.  Notify MD if PLT < 100 K.    Signed and Held   Signed and Held  Creatinine, serum  (enoxaparin (LOVENOX)    CrCl >/= 30 ml/min)  Once,   R    Comments:  Baseline for enoxaparin  therapy IF NOT ALREADY DRAWN.    Signed and Held   Signed and Held  Creatinine, serum  (enoxaparin (LOVENOX)    CrCl >/= 30 ml/min)  Weekly,   R    Comments:  while on enoxaparin therapy    Signed and Held   Signed and Held  Comprehensive metabolic panel  Tomorrow morning,   R     Signed and Held   Signed and Held  CBC  Tomorrow morning,   R     Signed and Held   Signed and Held  Magnesium  Tomorrow morning,   R     Signed and Held      Vitals/Pain Today's Vitals   12/28/17 1221 12/28/17 1229 12/28/17 1530 12/28/17 1734  BP: (!) 163/68  (!) 147/66 (!) 155/79  Pulse: 78  80 71  Resp: _0 Temp: 97.8 F (36.6 C)     TempSrc: Oral     SpO2: 97%  95% 91%  Weight: 150 lb (68 kg)     Height: _1  (1.6 m)     PainSc:  10-Worst pain ever      Isolation Precautions No active isolations  Medications Medications  morphine 4 MG/ML injection 4 mg (4 mg Intravenous Given 12/28/17 1520)  ondansetron (ZOFRAN) injection 4 mg (4 mg Intravenous Given 12/28/17 1519)  ceFAZolin (ANCEF) IVPB 1 g/50 mL premix (0 g Intravenous Stopped 12/28/17 1603)  vancomycin (VANCOCIN) IVPB 1000 mg/200 mL premix ( Intravenous Stopped 12/28/17 1730)  morphine 4 MG/ML injection 4 mg (4 mg Intravenous Given 12/28/17 1620)    Mobility non-ambulatory

## 2017-12-28 NOTE — ED Notes (Signed)
Patient's family reports patient is in the restroom at this time.

## 2017-12-28 NOTE — ED Provider Notes (Signed)
[facility] DEPT Provider Note   CSN: [id number] Arrival date & time: 12/28/17  1217     History   Chief Complaint Chief Complaint  Patient presents with  . Leg Pain    HPI [name] is a 77 y.o. female.  Pt presents to the ED today with left leg pain and swelling.  The pt injured her left hip on 6/28 while in exercise class.  On 7/2, she re-injured the hip while trying to get out of her car.  The pt then had an injection in her piriformis on 7/11 for trochanteric pain.  By 7/20, she was unable to walk and having severe pain to the left hip.  The pt then developed left lower leg redness and swelling.  She went to Dr. Trevor Mace office on 7/29 for her sx.  A DVT study was done which showed no DVT.  She was started on Keflex (but bid?) on the 29th.  Pt said sx are not any better.      Past Medical History:  Diagnosis Date  . Anemia   . Arthritis   . Asthma    adult onset  . Basal cell cancer    LUE; Porokeratosis also  . Chronic kidney disease 1963   strep in kidney due to strep throat-hospitalized 10 days  . Complication of anesthesia    small trachea  . Diabetes mellitus 2010   A1c 6.7%  . DVT (deep venous thrombosis) ([place]) 2006   post immobilization post cns surgery  . GERD (gastroesophageal reflux disease)    very mild  . Granulomatous lung disease ([place]) 2002   incidental Xray finding  . Hyperlipidemia   . Hypertension 2004   Hypertensive response on Stress Test  . Paralysis ([place]) 2006   post cervical fusion with spinal sac tear  with hematoma   . PTE (pulmonary thromboembolism) ([place]) 2006    Patient Active Problem List   Diagnosis Date Noted  . Unilateral primary osteoarthritis, right hip 12/14/2017  . Type 2 diabetes mellitus with diabetic neuropathy ([place]) 06/08/2017  . Neuropathic pain 03/30/2017  . Seasonal allergies 03/30/2017  . Lumbar scoliosis 08/12/2016  . Controlled diabetes mellitus type 2 with complications ([place])  76/28/[birth year]151  . Scoliosis 11/24/2015  . Low back pain 09/29/2014  . Diabetes mellitus with peripheral vascular disease ([place]) 04/10/2013  . Left carotid bruit 04/10/2013  . Anemia 03/31/2013  . Degenerative arthritis of hip 06/01/2012  . POSTMENOPAUSAL SYNDROME 09/16/2009  . ARTHRALGIA 09/16/2009  . Seasonal and perennial allergic rhinitis 10/08/2007  . OSTEOPENIA 08/06/2007  . ELEVATED BLOOD PRESSURE WITHOUT DIAGNOSIS OF HYPERTENSION 08/06/2007  . HYPERLIPIDEMIA 05/14/2007  . Asthma, mild intermittent 05/14/2007  . History of pulmonary embolus (PE) 08/31/2006    Past Surgical History:  Procedure Laterality Date  . ABDOMINAL EXPOSURE N/A 08/12/2016   Procedure: ABDOMINAL EXPOSURE;  Surgeon: Rosetta Posner, MD;  Location: [facility];  Service: Vascular;  Laterality: N/A;  . ANTERIOR LAT LUMBAR FUSION N/A 08/12/2016   Procedure: LUMBAR TWO-THREE, LUMBAR THREE-FOUR, LUMBAR FOUR-FIVE  ANTEROLATERAL LUMBAR INTERBODY FUSION;  Surgeon: Erline Levine, MD;  Location: [facility];  Service: Neurosurgery;  Laterality: N/A;  L2-3 L3-4 L4-5 Anterolateral lumbar interbody fusion  . ANTERIOR LUMBAR FUSION N/A 08/12/2016   Procedure: Lumbar Five-Sacral One Anterior lumbar interbody fusion with Dr. Sherren Mocha Early to assist;  Surgeon: Erline Levine, MD;  Location: [facility];  Service: Neurosurgery;  Laterality: N/A;  L5-S1 Anterior lumbar interbody fusion with Dr. Sherren Mocha Early to  assist  . BUNIONECTOMY    . CERVICAL FUSION  2006   Dr Annette Stable, NS;post op hematoma & cns leak & urinary retention  . COLONOSCOPY  1992 & 2002   negative  . epidural steroids  2006   cervical spine  . LUMBAR PERCUTANEOUS PEDICLE SCREW 4 LEVEL N/A 08/12/2016   Procedure: LUMBAR TWO-SACRAL ONE Percuataneous Pedicle Screws;  Surgeon: Erline Levine, MD;  Location: [facility];  Service: Neurosurgery;  Laterality: N/A;  . ROTATOR CUFF REPAIR  2009   Right  . SEPTOPLASTY    . TONSILLECTOMY  77 years old  . TOTAL HIP ARTHROPLASTY  06/01/2012   Procedure: TOTAL HIP  ARTHROPLASTY ANTERIOR APPROACH;  Surgeon: Mcarthur Rossetti, MD;  Location: [facility];  Service: Orthopedics;  Laterality: Left;  Left Total Hip Arthroplasty  . TUBAL LIGATION       OB History   None      Home Medications    Prior to Admission medications   Medication Sig Start Date End Date Taking? Authorizing Provider  albuterol (PROAIR HFA) 108 (90 Base) MCG/ACT inhaler Inhale 2 puffs into the lungs every 6 (six) hours as needed for wheezing or shortness of breath. 05/25/16 12/28/17 Yes Young, Tarri Fuller D, MD  alendronate (FOSAMAX) 70 MG tablet TAKE 1 TABLET(70 MG) BY MOUTH EVERY 7 DAYS WITH A FULL GLASS OF WATER AND ON AN EMPTY STOMACH 09/26/17  Yes [name], DO  Azelastine-Fluticasone 960-45 MCG/ACT SUSP 2 sprays each nostril bid 03/30/17  Yes [name] R, DO  Ca Phosphate-Cholecalciferol (CALCIUM 500 + D3) 250-500 MG-UNIT CHEW Chew by mouth.   Yes [provider]  cephALEXin (KEFLEX) 500 MG capsule Take 1 capsule (500 mg total) by mouth 2 (two) times daily. 12/25/17  Yes Pete Pelt, PA-C  cholecalciferol (VITAMIN D) 1000 UNITS tablet Take 1,000 Units by mouth daily.    Yes [provider]  ezetimibe (ZETIA) 10 MG tablet Take 1 tablet (10 mg total) by mouth daily. 12/20/17  Yes [name] R, DO  Fluticasone-Salmeterol (ADVAIR DISKUS) 250-50 MCG/DOSE AEPB USE 1 INHALATION TWO TIMES  DAILY ; RINSE MOUTH 05/25/17  Yes Young, Clinton D, MD  HYDROcodone-acetaminophen (NORCO/VICODIN) 5-325 MG tablet Take 1-2 tablets by mouth every 6 (six) hours as needed for moderate pain. 12/25/17  Yes Pete Pelt, PA-C  LYRICA 100 MG capsule TAKE ONE CAPSULE BY MOUTH TWICE DAILY 12/04/17  Yes Carollee Herter, Kendrick Fries R, DO  metFORMIN (GLUCOPHAGE-XR) 500 MG 24 hr tablet TAKE 1 TABLET(500 MG) BY MOUTH DAILY WITH BREAKFAST 10/20/17  Yes [name] R, DO  methocarbamol (ROBAXIN) 500 MG tablet Take 1 tablet by mouth 3 (three) times daily. 10/10/16  Yes  [provider]  Multiple Vitamin (MULTIVITAMIN WITH MINERALS) TABS tablet Take 1 tablet by mouth daily.   Yes [provider]  Multiple Vitamins-Minerals (OCUVITE EXTRA PO) Take 1 tablet by mouth daily.   Yes [provider]  MYRBETRIQ 50 MG TB24 tablet TAKE 1 TABLET(50 MG) BY MOUTH DAILY 11/07/17  Yes Carollee Herter, Kendrick Fries R, DO  Polyethyl Glycol-Propyl Glycol (SYSTANE OP) Place 1 drop into both eyes daily as needed (dry eyes).   Yes [provider]  traMADol (ULTRAM) 50 MG tablet TAKE 1 TABLET BY MOUTH EVERY 6 HOURS AS NEEDED Patient taking differently: TAKE 1 TABLET BY MOUTH EVERY 6 HOURS AS NEEDED for pain 12/04/17  Yes Carollee Herter, Kendrick Fries R, DO  Trolamine Salicylate (ASPERCREME EX) Apply 1 application topically daily  as needed (back pain).   Yes [provider]  celecoxib (CELEBREX) 200 MG capsule TAKE 1 CAPSULE(200 MG) BY MOUTH DAILY 12/28/17   Pete Pelt, PA-C  glucose blood [facility] VERIO) test strip Check blood sugar twice daily 06/08/17   Carollee Herter, Alferd Apa, DO  [facility] DELICA LANCETS 25Z MISC Check blood sugar twice daily 06/16/17   [name], DO    Family History Family History  Problem Relation Age of Onset  . COPD Father        emphysema  . Cancer Mother        cns cancer  . Diabetes Sister        TWIN sister ; also Fibromyalgia ; S/P stent 2004  . Heart disease Sister        stents @ 5 & 39  . Stroke Maternal Grandmother        in  late 74s  . Transient ischemic attack Paternal Aunt     Social History Social History   Tobacco Use  . Smoking status: Never Smoker  . Smokeless tobacco: Never Used  Substance Use Topics  . Alcohol use: Yes    Comment: twice a week; 2-4/week  . Drug use: No     Allergies   Statins   Review of Systems Review of Systems  Musculoskeletal:       Left hip pain  Skin:       lle redness  All other systems reviewed and are negative.    Physical Exam Updated Vital  Signs BP (!) 147/66   Pulse 80   Temp 97.8 F (36.6 C) (Oral)   Resp 15   Ht 5\' 3"  (1.6 m)   Wt 68 kg (150 lb)   SpO2 95%   BMI 26.57 kg/m   Physical Exam  Constitutional: She is oriented to person, place, and time. She appears well-developed and well-nourished.  HENT:  Head: Normocephalic and atraumatic.  Right Ear: External ear normal.  Left Ear: External ear normal.  Nose: Nose normal.  Mouth/Throat: Oropharynx is clear and moist.  Eyes: Pupils are equal, round, and reactive to light. Conjunctivae and EOM are normal.  Neck: Normal range of motion. Neck supple.  Cardiovascular: Normal rate, regular rhythm, normal heart sounds and intact distal pulses.  Pulmonary/Chest: Effort normal and breath sounds normal.  Abdominal: Soft. Bowel sounds are normal.  Musculoskeletal:       Left hip: She exhibits decreased range of motion and bony tenderness.       Legs: Neurological: She is alert and oriented to person, place, and time.  Skin: Skin is warm. Capillary refill takes less than 2 seconds.  LLE cellulitis  Psychiatric: She has a normal mood and affect. Her behavior is normal. Judgment and thought content normal.  Nursing note and vitals reviewed.    ED Treatments / Results  Labs (all labs ordered are listed, but only abnormal results are displayed) Labs Reviewed  COMPREHENSIVE METABOLIC PANEL - Abnormal; Notable for the following components:      Result Value   Glucose, Bld 102 (*)    GFR calc non Af Amer 59 (*)    All other components within normal limits  URINALYSIS, ROUTINE W REFLEX MICROSCOPIC - Abnormal; Notable for the following components:   Color, Urine COLORLESS (*)    Specific Gravity, Urine 1.003 (*)    Ketones, ur 5 (*)    All other components within normal limits  CBC WITH DIFFERENTIAL/PLATELET - Abnormal; Notable  for the following components:   Hemoglobin 11.2 (*)    HCT 34.7 (*)    All other components within normal limits  CULTURE, BLOOD (ROUTINE X  2)  CULTURE, BLOOD (ROUTINE X 2)  I-STAT CG4 LACTIC ACID, ED  I-STAT CG4 LACTIC ACID, ED    EKG None  Radiology Ct Pelvis Wo Contrast  Result Date: 12/28/2017 CLINICAL DATA:  Left leg pain and left hip pain. Injured while opening a car door on July 2nd. History of total left hip arthroplasty and back surgery. EXAM: CT PELVIS WITHOUT CONTRAST TECHNIQUE: Multidetector CT imaging of the pelvis was performed following the standard protocol without intravenous contrast. COMPARISON:  Radiographs 12/25/2017 FINDINGS: The left total hip arthroplasty is well seated. No complicating features are identified. No evidence of loosening or periprosthetic fracture. No findings suspicious for particle disease or metallosis. Mild to moderate subcutaneous soft tissue swelling/edema/fluid involving the left upper thigh along with mild skin thickening is suspicious for cellulitis. No discrete fluid collection to suggest an abscess. No obvious findings for myofasciitis or pyomyositis. No hematoma or mass. The pubic symphysis and SI joints are intact. Mild degenerative changes. Extensive surgical changes involving the lower lumbar spine without obvious complicating features. Severe degenerative disease involving the right hip with joint space narrowing, bone-on-bone appearance, osteophytic spurring and subchondral cystic change. No fracture or AVN. No significant intrapelvic abnormalities are identified. No mass or adenopathy. The uterus and ovaries appear normal. The bladder is grossly normal. No inguinal mass or inguinal hernia. Small scattered inguinal lymph nodes. IMPRESSION: 1. Findings suggest cellulitis involving the left thigh without discrete drainable abscess, myofasciitis or pyomyositis. 2. Well seated components of a total left hip arthroplasty without complicating features. 3. No significant intrapelvic abnormalities are identified. 4. Severe right hip joint degenerative changes. Electronically Signed   By: Marijo Sanes M.D.   On: 12/28/2017 15:46    Procedures Procedures (including critical care time)  Medications Ordered in ED Medications  vancomycin (VANCOCIN) IVPB 1000 mg/200 mL premix (has no administration in time range)  morphine 4 MG/ML injection 4 mg (has no administration in time range)  morphine 4 MG/ML injection 4 mg (4 mg Intravenous Given 12/28/17 1520)  ondansetron (ZOFRAN) injection 4 mg (4 mg Intravenous Given 12/28/17 1519)  ceFAZolin (ANCEF) IVPB 1 g/50 mL premix (1 g Intravenous New Bag/Given 12/28/17 1533)     Initial Impression / Assessment and Plan / ED Course  I have reviewed the triage vital signs and the nursing notes.  Pertinent labs & imaging results that were available during my care of the patient were reviewed by me and considered in my medical decision making (see chart for details).   Pt given IV ancef/vancomycin.  She has failed outpatient treatment.  She was d/w Dr. Florene Glen (triad) for admission.  Final Clinical Impressions(s) / ED Diagnoses   Final diagnoses:  Cellulitis of left lower extremity  Failure of outpatient treatment    ED Discharge Orders    None       Isla Pence, MD 12/28/17 1615

## 2017-12-28 NOTE — ED Triage Notes (Signed)
Pt c/o leg pain. Was dx with cellulitis in lower leg and taking abx for that. Pt states pain now is in her left hip. Pt states her left leg stretched and  moved with a  Car door opening July 2nd. Pt also c/o lower back pain.

## 2017-12-28 NOTE — Telephone Encounter (Signed)
Signed.

## 2017-12-28 NOTE — Telephone Encounter (Signed)
We could MRI the hip to r/o a fracture

## 2017-12-28 NOTE — Telephone Encounter (Signed)
[facility] checking status, states this is urgent

## 2017-12-28 NOTE — Telephone Encounter (Signed)
Paperwork forwarded to Raquel Sarna to handle/SLS 08/01

## 2017-12-28 NOTE — H&P (Signed)
History and Physical    [name] XBM:[id number] DOB: 08/27/1940 DOA: 12/28/2017  PCP: [name], DO  Patient coming from: home  I have personally briefly reviewed patient's old medical records in [place]  Chief Complaint: LLE swelling  HPI: [name] is a 77 y.o. female with medical history significant of arthritis, T2DM, cervical and lumbar fusions presenting with pain in her left leg with findings consistent with cellulitis.  Pain started 7/20, then got better.  It returned on this past Friday.  She saw orthopedics on 7/29 who placed her on keflex and r/o DVT with [place].  Her pain and swelling progressively got worse despite oral antibiotics and pain meds so she presented here today.  She denies fevers, chills, cough, cold, CP, SOB.     ED Course: CT pelvis, labs, IV antibiotics.  Admit for cellulitis.  Review of Systems: As per HPI otherwise 10 point review of systems negative.   Past Medical History:  Diagnosis Date  . Anemia   . Arthritis   . Asthma    adult onset  . Basal cell cancer    LUE; Porokeratosis also  . Chronic kidney disease 1963   strep in kidney due to strep throat-hospitalized 10 days  . Complication of anesthesia    small trachea  . Diabetes mellitus 2010   A1c 6.7%  . DVT (deep venous thrombosis) ([place]) 2006   post immobilization post cns surgery  . GERD (gastroesophageal reflux disease)    very mild  . Granulomatous lung disease ([place]) 2002   incidental Xray finding  . Hyperlipidemia   . Hypertension 2004   Hypertensive response on Stress Test  . Paralysis ([place]) 2006   post cervical fusion with spinal sac tear  with hematoma   . PTE (pulmonary thromboembolism) ([place]) 2006    Past Surgical History:  Procedure Laterality Date  . ABDOMINAL EXPOSURE N/A 08/12/2016   Procedure: ABDOMINAL EXPOSURE;  Surgeon: Rosetta Posner, MD;  Location: [facility];  Service: Vascular;  Laterality: N/A;  . ANTERIOR LAT LUMBAR FUSION N/A 08/12/2016   Procedure: LUMBAR TWO-THREE, LUMBAR THREE-FOUR, LUMBAR FOUR-FIVE  ANTEROLATERAL LUMBAR INTERBODY FUSION;  Surgeon: Erline Levine, MD;  Location: [facility];  Service: Neurosurgery;  Laterality: N/A;  L2-3 L3-4 L4-5 Anterolateral lumbar interbody fusion  . ANTERIOR LUMBAR FUSION N/A 08/12/2016   Procedure: Lumbar Five-Sacral One Anterior lumbar interbody fusion with Dr. Sherren Mocha Early to assist;  Surgeon: Erline Levine, MD;  Location: [facility];  Service: Neurosurgery;  Laterality: N/A;  L5-S1 Anterior lumbar interbody fusion with Dr. Sherren Mocha Early to assist  . BUNIONECTOMY    . CERVICAL FUSION  2006   Dr Annette Stable, NS;post op hematoma & cns leak & urinary retention  . COLONOSCOPY  1992 & 2002   negative  . epidural steroids  2006   cervical spine  . LUMBAR PERCUTANEOUS PEDICLE SCREW 4 LEVEL N/A 08/12/2016   Procedure: LUMBAR TWO-SACRAL ONE Percuataneous Pedicle Screws;  Surgeon: Erline Levine, MD;  Location: [facility];  Service: Neurosurgery;  Laterality: N/A;  . ROTATOR CUFF REPAIR  2009   Right  . SEPTOPLASTY    . TONSILLECTOMY  77 years old  . TOTAL HIP ARTHROPLASTY  06/01/2012   Procedure: TOTAL HIP ARTHROPLASTY ANTERIOR APPROACH;  Surgeon: Mcarthur Rossetti, MD;  Location: [facility];  Service: Orthopedics;  Laterality: Left;  Left Total Hip Arthroplasty  . TUBAL LIGATION       reports that she has never smoked. She has  never used smokeless tobacco. She reports that she drinks alcohol. She reports that she does not use drugs.  Allergies  Allergen Reactions  . Statins Other (See Comments)    Myalgias and muscle weakness    Family History  Problem Relation Age of Onset  . COPD Father        emphysema  . Cancer Mother        cns cancer  . Diabetes Sister        TWIN sister ; also Fibromyalgia ; S/P stent 2004  . Heart disease Sister        stents @ 40 & 80  . Stroke Maternal Grandmother        in  late 59s  . Transient ischemic attack Paternal Aunt     Prior to Admission medications   Medication Sig  Start Date End Date Taking? Authorizing Provider  albuterol (PROAIR HFA) 108 (90 Base) MCG/ACT inhaler Inhale 2 puffs into the lungs every 6 (six) hours as needed for wheezing or shortness of breath. 05/25/16 12/28/17 Yes Young, Tarri Fuller D, MD  alendronate (FOSAMAX) 70 MG tablet TAKE 1 TABLET(70 MG) BY MOUTH EVERY 7 DAYS WITH A FULL GLASS OF WATER AND ON AN EMPTY STOMACH 09/26/17  Yes [name], DO  Azelastine-Fluticasone 161-09 MCG/ACT SUSP 2 sprays each nostril bid 03/30/17  Yes [name] R, DO  Ca Phosphate-Cholecalciferol (CALCIUM 500 + D3) 250-500 MG-UNIT CHEW Chew by mouth.   Yes [provider]  cephALEXin (KEFLEX) 500 MG capsule Take 1 capsule (500 mg total) by mouth 2 (two) times daily. 12/25/17  Yes Pete Pelt, PA-C  cholecalciferol (VITAMIN D) 1000 UNITS tablet Take 1,000 Units by mouth daily.    Yes [provider]  ezetimibe (ZETIA) 10 MG tablet Take 1 tablet (10 mg total) by mouth daily. 12/20/17  Yes [name] R, DO  Fluticasone-Salmeterol (ADVAIR DISKUS) 250-50 MCG/DOSE AEPB USE 1 INHALATION TWO TIMES  DAILY ; RINSE MOUTH 05/25/17  Yes Young, Clinton D, MD  HYDROcodone-acetaminophen (NORCO/VICODIN) 5-325 MG tablet Take 1-2 tablets by mouth every 6 (six) hours as needed for moderate pain. 12/25/17  Yes Pete Pelt, PA-C  LYRICA 100 MG capsule TAKE ONE CAPSULE BY MOUTH TWICE DAILY 12/04/17  Yes Carollee Herter, Kendrick Fries R, DO  metFORMIN (GLUCOPHAGE-XR) 500 MG 24 hr tablet TAKE 1 TABLET(500 MG) BY MOUTH DAILY WITH BREAKFAST 10/20/17  Yes [name] R, DO  methocarbamol (ROBAXIN) 500 MG tablet Take 1 tablet by mouth 3 (three) times daily. 10/10/16  Yes [provider]  Multiple Vitamin (MULTIVITAMIN WITH MINERALS) TABS tablet Take 1 tablet by mouth daily.   Yes [provider]  Multiple Vitamins-Minerals (OCUVITE EXTRA PO) Take 1 tablet by mouth daily.   Yes [provider]  MYRBETRIQ 50 MG TB24 tablet TAKE 1  TABLET(50 MG) BY MOUTH DAILY 11/07/17  Yes Carollee Herter, Kendrick Fries R, DO  Polyethyl Glycol-Propyl Glycol (SYSTANE OP) Place 1 drop into both eyes daily as needed (dry eyes).   Yes [provider]  traMADol (ULTRAM) 50 MG tablet TAKE 1 TABLET BY MOUTH EVERY 6 HOURS AS NEEDED Patient taking differently: TAKE 1 TABLET BY MOUTH EVERY 6 HOURS AS NEEDED for pain 12/04/17  Yes Carollee Herter, Alferd Apa, DO  Trolamine Salicylate (ASPERCREME EX) Apply 1 application topically daily as needed (back pain).   Yes [provider]  celecoxib (CELEBREX) 200 MG capsule TAKE 1 CAPSULE(200 MG) BY MOUTH DAILY 12/28/17  Pete Pelt, PA-C  glucose blood [facility] VERIO) test strip Check blood sugar twice daily 06/08/17   Carollee Herter, Alferd Apa, DO  [facility] DELICA LANCETS 02I MISC Check blood sugar twice daily 06/16/17   [name], DO    Physical Exam: Vitals:   12/28/17 1221 12/28/17 1530 12/28/17 1734  BP: (!) 163/68 (!) 147/66 (!) 155/79  Pulse: 78 80 71  Resp: 15 15 16   Temp: 97.8 F (36.6 C)    TempSrc: Oral    SpO2: 97% 95% 91%  Weight: 68 kg (150 lb)    Height: 5\' 3"  (1.6 m)      Constitutional: NAD, calm, comfortable Vitals:   12/28/17 1221 12/28/17 1530 12/28/17 1734  BP: (!) 163/68 (!) 147/66 (!) 155/79  Pulse: 78 80 71  Resp: 15 15 16   Temp: 97.8 F (36.6 C)    TempSrc: Oral    SpO2: 97% 95% 91%  Weight: 68 kg (150 lb)    Height: 5\' 3"  (1.6 m)     Eyes: PERRL, lids and conjunctivae normal ENMT: Mucous membranes are moist. Posterior pharynx clear of any exudate or lesions.Normal dentition.  Neck: normal, supple, no masses, no thyromegaly Respiratory: clear to auscultation bilaterally, no wheezing, no crackles. Normal respiratory effort. No accessory muscle use.  Cardiovascular: Regular rate and rhythm, no murmurs / rubs / gallops. No extremity edema. 2+ pedal pulses. No carotid bruits.  Abdomen: no tenderness, no masses palpated. No hepatosplenomegaly. Bowel sounds  positive.  Musculoskeletal: no clubbing / cyanosis. No joint deformity upper and lower extremities. Good ROM, no contractures. Normal muscle tone.  Skin: erythema to LLE, no palpable fluctuance or crepitus (erythema most notable to lower leg).  Edema on LLE. Neurologic: CN 2-12 grossly intact. Sensation intact, DTR normal. Strength 5/5 in all 4.  Psychiatric: Normal judgment and insight. Alert and oriented x 3. Normal mood.   Labs on Admission: I have personally reviewed following labs and imaging studies  CBC: Recent Labs  Lab 12/28/17 1328  WBC 7.0  NEUTROABS 4.7  HGB 11.2*  HCT 34.7*  MCV 88.5  PLT 097   Basic Metabolic Panel: Recent Labs  Lab 12/28/17 1328  NA 141  K 4.1  CL 109  CO2 24  GLUCOSE 102*  BUN 12  CREATININE 0.92  CALCIUM 9.5   GFR: Estimated Creatinine Clearance: 48.1 mL/min (by C-G formula based on SCr of 0.92 mg/dL). Liver Function Tests: Recent Labs  Lab 12/28/17 1328  AST 28  ALT 24  ALKPHOS 57  BILITOT 0.7  PROT 7.5  ALBUMIN 4.2   No results for input(s): LIPASE, AMYLASE in the last 168 hours. No results for input(s): AMMONIA in the last 168 hours. Coagulation Profile: No results for input(s): INR, PROTIME in the last 168 hours. Cardiac Enzymes: No results for input(s): CKTOTAL, CKMB, CKMBINDEX, TROPONINI in the last 168 hours. BNP (last 3 results) No results for input(s): PROBNP in the last 8760 hours. HbA1C: No results for input(s): HGBA1C in the last 72 hours. CBG: No results for input(s): GLUCAP in the last 168 hours. Lipid Profile: No results for input(s): CHOL, HDL, LDLCALC, TRIG, CHOLHDL, LDLDIRECT in the last 72 hours. Thyroid Function Tests: No results for input(s): TSH, T4TOTAL, FREET4, T3FREE, THYROIDAB in the last 72 hours. Anemia Panel: No results for input(s): VITAMINB12, FOLATE, FERRITIN, TIBC, IRON, RETICCTPCT in the last 72 hours. Urine analysis:    Component Value Date/Time   COLORURINE COLORLESS (A) 12/28/2017  1328   APPEARANCEUR  CLEAR 12/28/2017 1328   LABSPEC 1.003 (L) 12/28/2017 1328   PHURINE 7.0 12/28/2017 1328   GLUCOSEU NEGATIVE 12/28/2017 [place] 06/03/2016 1213   HGBUR NEGATIVE 12/28/2017 [place] 12/28/2017 1328   BILIRUBINUR neg 12/05/2016 1244   KETONESUR 5 (A) 12/28/2017 1328   PROTEINUR NEGATIVE 12/28/2017 1328   UROBILINOGEN 0.2 12/05/2016 1244   UROBILINOGEN 0.2 06/03/2016 1213   NITRITE NEGATIVE 12/28/2017 1328   LEUKOCYTESUR NEGATIVE 12/28/2017 1328    Radiological Exams on Admission: Ct Pelvis Wo Contrast  Result Date: 12/28/2017 CLINICAL DATA:  Left leg pain and left hip pain. Injured while opening a car door on July 2nd. History of total left hip arthroplasty and back surgery. EXAM: CT PELVIS WITHOUT CONTRAST TECHNIQUE: Multidetector CT imaging of the pelvis was performed following the standard protocol without intravenous contrast. COMPARISON:  Radiographs 12/25/2017 FINDINGS: The left total hip arthroplasty is well seated. No complicating features are identified. No evidence of loosening or periprosthetic fracture. No findings suspicious for particle disease or metallosis. Mild to moderate subcutaneous soft tissue swelling/edema/fluid involving the left upper thigh along with mild skin thickening is suspicious for cellulitis. No discrete fluid collection to suggest an abscess. No obvious findings for myofasciitis or pyomyositis. No hematoma or mass. The pubic symphysis and SI joints are intact. Mild degenerative changes. Extensive surgical changes involving the lower lumbar spine without obvious complicating features. Severe degenerative disease involving the right hip with joint space narrowing, bone-on-bone appearance, osteophytic spurring and subchondral cystic change. No fracture or AVN. No significant intrapelvic abnormalities are identified. No mass or adenopathy. The uterus and ovaries appear normal. The bladder is grossly normal. No  inguinal mass or inguinal hernia. Small scattered inguinal lymph nodes. IMPRESSION: 1. Findings suggest cellulitis involving the left thigh without discrete drainable abscess, myofasciitis or pyomyositis. 2. Well seated components of a total left hip arthroplasty without complicating features. 3. No significant intrapelvic abnormalities are identified. 4. Severe right hip joint degenerative changes. Electronically Signed   By: Marijo Sanes M.D.   On: 12/28/2017 15:46    EKG: Independently reviewed. pending  Assessment/Plan Active Problems:   Cellulitis  Cellulitis:  Erythema to LLE.  CT of pelvis with  Findings c/w cellulitis of L thigh.  [place] from 7/29 negative for DVT.  Failed abx therapy with keflex (dosed BID).   Received vanc x1 in ED Will transition to IV ancef given lack of purulence on exam Follow blood cultures  T2DM: hold metformin, start SSI  Overactive bladder: continue myrbetriq  Asthma: continue dulera, albuterol prn  Anemia: follow  Follow pending EKG  DVT prophylaxis: lovenox  Code Status: DNR  Family Communication: daughter at bedside  Disposition Plan: home, pending improvement  Consults called: none  Admission status: inpatient   Fayrene Helper MD [facility] Pager [phone]   If 7PM-7AM, please contact night-coverage [email or web address] Password [facility]  12/28/2017, 7:14 PM

## 2017-12-29 ENCOUNTER — Other Ambulatory Visit (INDEPENDENT_AMBULATORY_CARE_PROVIDER_SITE_OTHER): Payer: Self-pay

## 2017-12-29 DIAGNOSIS — L03116 Cellulitis of left lower limb: Principal | ICD-10-CM

## 2017-12-29 LAB — COMPREHENSIVE METABOLIC PANEL
ALK PHOS: 50 U/L (ref 38–126)
ALT: 20 U/L (ref 0–44)
AST: 21 U/L (ref 15–41)
Albumin: 3.1 g/dL — ABNORMAL LOW (ref 3.5–5.0)
Anion gap: 7 (ref 5–15)
BUN: 13 mg/dL (ref 8–23)
CALCIUM: 8.9 mg/dL (ref 8.9–10.3)
CHLORIDE: 109 mmol/L (ref 98–111)
CO2: 26 mmol/L (ref 22–32)
CREATININE: 0.82 mg/dL (ref 0.44–1.00)
GFR calc Af Amer: >60 mL/min (ref >60)
Glucose, Bld: 139 mg/dL — ABNORMAL HIGH (ref 70–99)
Potassium: 4.4 mmol/L (ref 3.5–5.1)
Sodium: 142 mmol/L (ref 135–145)
Total Bilirubin: 0.6 mg/dL (ref 0.3–1.2)
Total Protein: 6.1 g/dL — ABNORMAL LOW (ref 6.5–8.1)

## 2017-12-29 LAB — CBC
HCT: 33.9 % — ABNORMAL LOW (ref 36.0–46.0)
Hemoglobin: 10.9 g/dL — ABNORMAL LOW (ref 12.0–15.0)
MCH: 28.4 pg (ref 26.0–34.0)
MCHC: 32.2 g/dL (ref 30.0–36.0)
MCV: 88.3 fL (ref 78.0–100.0)
Platelets: 230 10*3/uL (ref 150–400)
RBC: 3.84 MIL/uL — ABNORMAL LOW (ref 3.87–5.11)
RDW: 15.1 % (ref 11.5–15.5)
WBC: 6.9 10*3/uL (ref 4.0–10.5)

## 2017-12-29 LAB — GLUCOSE, CAPILLARY
GLUCOSE-CAPILLARY: 204 mg/dL — AB (ref 70–99)
Glucose-Capillary: 105 mg/dL — ABNORMAL HIGH (ref 70–99)
Glucose-Capillary: 75 mg/dL (ref 70–99)
Glucose-Capillary: 145 mg/dL — ABNORMAL HIGH (ref 70–99)

## 2017-12-29 LAB — MAGNESIUM: MAGNESIUM: 2.2 mg/dL (ref 1.7–2.4)

## 2017-12-29 MED ORDER — MORPHINE SULFATE (PF) 2 MG/ML IV SOLN
1.0000 mg | INTRAVENOUS | Status: DC | PRN
  Administered 2017-12-30: 1 mg via INTRAVENOUS
  Filled 2017-12-29: qty 1

## 2017-12-29 MED ORDER — MUSCLE RUB 10-15 % EX CREA
TOPICAL_CREAM | Freq: Every day | CUTANEOUS | Status: DC | PRN
  Filled 2017-12-29: qty 85

## 2017-12-29 NOTE — Progress Notes (Signed)
PROGRESS NOTE    [name]  XBL:[id number] DOB: 10-Sep-1940 DOA: 12/28/2017 PCP: [name], DO     Brief Narrative:  [name] is a 77 year old female with medical history significant for arthritis, type 2 diabetes, cervical and lumbar fusion who presents with pain in her left hip.  She states that the pain started on 7/20, improved.  Then pain returned on Friday.  She is followed up with orthopedic surgery on 7/29 who placed her on Keflex for cellulitis.  Her pain and swelling progressively got worse despite oral antibiotics.  She denies any fevers or chills.  She was admitted for further treatment and evaluation of cellulitis.  New events last 24 hours / Subjective: Continues to have pain in her left flank and her left hip.  Assessment & Plan:   Active Problems:   Cellulitis  Lower extremity cellulitis -Sepsis ruled out  -Blood culture pending  -CT findings suggest cellulitis involving the left thigh without discrete drainable abscess, myofasciitis or pyomyositis. -Venous duplex negative for DVT -She did fail outpatient antibiotics with Keflex -Continue Ancef for now  Type 2 diabetes -Sliding scale insulin  HLD -Continue zetia   Overactive bladder  -Continue Myrbetriq    DVT prophylaxis: Lovenox Code Status: DNR Family Communication: No family at bedside Disposition Plan: Pending improvement of cellulitis, suspect she can return home   Consultants:   None  Procedures:   None  Antimicrobials:  Anti-infectives (From admission, onward)   Start     Dose/Rate Route Frequency Ordered Stop   12/28/17 2330  ceFAZolin (ANCEF) IVPB 1 g/50 mL premix     1 g 100 mL/hr over 30 Minutes Intravenous Every 8 hours 12/28/17 2005     12/28/17 1330  ceFAZolin (ANCEF) IVPB 1 g/50 mL premix     1 g 100 mL/hr over 30 Minutes Intravenous  Once 12/28/17 1329 12/28/17 1603   12/28/17 1330  vancomycin (VANCOCIN) IVPB 1000 mg/200 mL premix     1,000 mg 200 mL/hr over  60 Minutes Intravenous  Once 12/28/17 1329 12/28/17 1730       Objective: Vitals:   12/28/17 2001 12/28/17 2149 12/29/17 0513 12/29/17 1108  BP: (!) 154/69  (!) 109/54   Pulse: 68  67   Resp: 18  18   Temp: 98.5 F (36.9 C)  98.2 F (36.8 C)   TempSrc:      SpO2: 94% 95% 95% 94%  Weight: 68.9 kg (152 lb)     Height: 5\' 3"  (1.6 m)       Intake/Output Summary (Last 24 hours) at 12/29/2017 1221 Last data filed at 12/29/2017 0750 Gross per 24 hour  Intake 292.86 ml  Output 525 ml  Net -232.14 ml   Filed Weights   12/28/17 1221 12/28/17 2001  Weight: 68 kg (150 lb) 68.9 kg (152 lb)    Examination:  General exam: Appears calm and comfortable  Respiratory system: Clear to auscultation. Respiratory effort normal. Cardiovascular system: S1 & S2 heard, RRR. No JVD, murmurs, rubs, gallops or clicks. No pedal edema. Gastrointestinal system: Abdomen is nondistended, soft and nontender. No organomegaly or masses felt. Normal bowel sounds heard. Central nervous system: Alert and oriented. No focal neurological deficits. Extremities: Symmetric 5 x 5 power. Skin: + Erythema of left flank as well as left lower extremity near her ankle Psychiatry: Judgement and insight appear normal. Mood & affect appropriate.   Data Reviewed: I have personally reviewed following labs and imaging studies  CBC:  Recent Labs  Lab 12/28/17 1328 12/29/17 0613  WBC 7.0 6.9  NEUTROABS 4.7  --   HGB 11.2* 10.9*  HCT 34.7* 33.9*  MCV 88.5 88.3  PLT 239 563   Basic Metabolic Panel: Recent Labs  Lab 12/28/17 1328 12/29/17 0613  NA 141 142  K 4.1 4.4  CL 109 109  CO2 24 26  GLUCOSE 102* 139*  BUN 12 13  CREATININE 0.92 0.82  CALCIUM 9.5 8.9  MG  --  2.2   GFR: Estimated Creatinine Clearance: 54.4 mL/min (by C-G formula based on SCr of 0.82 mg/dL). Liver Function Tests: Recent Labs  Lab 12/28/17 1328 12/29/17 0613  AST 28 21  ALT 24 20  ALKPHOS 57 50  BILITOT 0.7 0.6  PROT 7.5 6.1*    ALBUMIN 4.2 3.1*   No results for input(s): LIPASE, AMYLASE in the last 168 hours. No results for input(s): AMMONIA in the last 168 hours. Coagulation Profile: No results for input(s): INR, PROTIME in the last 168 hours. Cardiac Enzymes: No results for input(s): CKTOTAL, CKMB, CKMBINDEX, TROPONINI in the last 168 hours. BNP (last 3 results) No results for input(s): PROBNP in the last 8760 hours. HbA1C: No results for input(s): HGBA1C in the last 72 hours. CBG: Recent Labs  Lab 12/28/17 2020 12/29/17 0739 12/29/17 1156  GLUCAP 139* 105* 204*   Lipid Profile: No results for input(s): CHOL, HDL, LDLCALC, TRIG, CHOLHDL, LDLDIRECT in the last 72 hours. Thyroid Function Tests: No results for input(s): TSH, T4TOTAL, FREET4, T3FREE, THYROIDAB in the last 72 hours. Anemia Panel: No results for input(s): VITAMINB12, FOLATE, FERRITIN, TIBC, IRON, RETICCTPCT in the last 72 hours. Sepsis Labs: Recent Labs  Lab 12/28/17 1441 12/28/17 1803  LATICACIDVEN 0.94 0.56    Recent Results (from the past 240 hour(s))  Culture, blood (routine x 2)     Status: None (Preliminary result)   Collection Time: 12/28/17  1:51 PM  Result Value Ref Range Status   Specimen Description   Final    BLOOD LEFT ANTECUBITAL Performed at [facility] [address] [place], [place] [address]    Special Requests   Final    BOTTLES DRAWN AEROBIC AND ANAEROBIC Blood Culture adequate volume Performed at [facility] [address] [place], [place] [address]    Culture   Final    NO GROWTH < 24 HOURS Performed at [facility] [address], [place], [place] [address]    Report Status PENDING  Incomplete  Culture, blood (routine x 2)     Status: None (Preliminary result)   Collection Time: 12/28/17  8:19 PM  Result Value Ref Range Status   Specimen Description SITE NOT SPECIFIED  Final   Special Requests   Final    BOTTLES DRAWN AEROBIC ONLY Blood Culture  adequate volume   Culture   Final    NO GROWTH < 12 HOURS Performed at [facility], [address] [place], [place] [address]    Report Status PENDING  Incomplete       Radiology Studies: Ct Pelvis Wo Contrast  Result Date: 12/28/2017 CLINICAL DATA:  Left leg pain and left hip pain. Injured while opening a car door on July 2nd. History of total left hip arthroplasty and back surgery. EXAM: CT PELVIS WITHOUT CONTRAST TECHNIQUE: Multidetector CT imaging of the pelvis was performed following the standard protocol without intravenous contrast. COMPARISON:  Radiographs 12/25/2017 FINDINGS: The left total hip arthroplasty is well seated.  No complicating features are identified. No evidence of loosening or periprosthetic fracture. No findings suspicious for particle disease or metallosis. Mild to moderate subcutaneous soft tissue swelling/edema/fluid involving the left upper thigh along with mild skin thickening is suspicious for cellulitis. No discrete fluid collection to suggest an abscess. No obvious findings for myofasciitis or pyomyositis. No hematoma or mass. The pubic symphysis and SI joints are intact. Mild degenerative changes. Extensive surgical changes involving the lower lumbar spine without obvious complicating features. Severe degenerative disease involving the right hip with joint space narrowing, bone-on-bone appearance, osteophytic spurring and subchondral cystic change. No fracture or AVN. No significant intrapelvic abnormalities are identified. No mass or adenopathy. The uterus and ovaries appear normal. The bladder is grossly normal. No inguinal mass or inguinal hernia. Small scattered inguinal lymph nodes. IMPRESSION: 1. Findings suggest cellulitis involving the left thigh without discrete drainable abscess, myofasciitis or pyomyositis. 2. Well seated components of a total left hip arthroplasty without complicating features. 3. No significant intrapelvic abnormalities are  identified. 4. Severe right hip joint degenerative changes. Electronically Signed   By: Marijo Sanes M.D.   On: 12/28/2017 15:46      Scheduled Meds: . enoxaparin (LOVENOX) injection  40 mg Subcutaneous Q24H  . ezetimibe  10 mg Oral Daily  . insulin aspart  0-5 Units Subcutaneous QHS  . insulin aspart  0-9 Units Subcutaneous TID WC  . mirabegron ER  50 mg Oral Daily  . mometasone-formoterol  2 puff Inhalation BID  . polyethylene glycol  17 g Oral Daily  . pregabalin  100 mg Oral TID   Continuous Infusions: .  ceFAZolin (ANCEF) IV 1 g (12/29/17 0759)     LOS: 1 day    Time spent: 35 minutes   Dessa Phi, DO [facility] [email or web address] Password [facility] 12/29/2017, 12:21 PM

## 2017-12-29 NOTE — Progress Notes (Signed)
Pt has been admitted into 1519 from the ED for cellulitis of left hip with redness of left anterolateral thigh with reddness/ blanching of both flanks & abdomen, left lower leg marked for cellulitis as well.

## 2017-12-29 NOTE — Telephone Encounter (Signed)
Looks like she actually went to the hospital yesterday and maybe got admitted?

## 2017-12-30 LAB — BASIC METABOLIC PANEL
Anion gap: 8 (ref 5–15)
BUN: 13 mg/dL (ref 8–23)
CO2: 24 mmol/L (ref 22–32)
Calcium: 8.7 mg/dL — ABNORMAL LOW (ref 8.9–10.3)
Chloride: 109 mmol/L (ref 98–111)
Creatinine, Ser: 0.71 mg/dL (ref 0.44–1.00)
GLUCOSE: 120 mg/dL — AB (ref 70–99)
Potassium: 4.2 mmol/L (ref 3.5–5.1)
SODIUM: 141 mmol/L (ref 135–145)

## 2017-12-30 LAB — GLUCOSE, CAPILLARY
GLUCOSE-CAPILLARY: 111 mg/dL — AB (ref 70–99)
Glucose-Capillary: 118 mg/dL — ABNORMAL HIGH (ref 70–99)
Glucose-Capillary: 105 mg/dL — ABNORMAL HIGH (ref 70–99)
Glucose-Capillary: 162 mg/dL — ABNORMAL HIGH (ref 70–99)

## 2017-12-30 LAB — CBC
HCT: 33.6 % — ABNORMAL LOW (ref 36.0–46.0)
Hemoglobin: 10.9 g/dL — ABNORMAL LOW (ref 12.0–15.0)
MCH: 29 pg (ref 26.0–34.0)
MCHC: 32.4 g/dL (ref 30.0–36.0)
MCV: 89.4 fL (ref 78.0–100.0)
PLATELETS: 246 10*3/uL (ref 150–400)
RBC: 3.76 MIL/uL — ABNORMAL LOW (ref 3.87–5.11)
RDW: 15.1 % (ref 11.5–15.5)
WBC: 5.2 10*3/uL (ref 4.0–10.5)

## 2017-12-30 MED ORDER — METHOCARBAMOL 500 MG PO TABS
500.0000 mg | ORAL_TABLET | Freq: Three times a day (TID) | ORAL | Status: AC | PRN
  Administered 2017-12-30 (×2): 500 mg via ORAL
  Filled 2017-12-30 (×2): qty 1

## 2017-12-30 MED ORDER — HYDROCODONE-ACETAMINOPHEN 5-325 MG PO TABS
1.0000 | ORAL_TABLET | ORAL | Status: DC | PRN
  Administered 2017-12-30 – 2017-12-31 (×6): 2 via ORAL
  Filled 2017-12-30 (×6): qty 2

## 2017-12-30 NOTE — Evaluation (Signed)
Physical Therapy Evaluation Patient Details Name: [name] MRN: [id number] DOB: 03/21/1941 Today's Date: 12/30/2017   History of Present Illness  [name] is a 77 year old female with medical history significant for arthritis, type 2 diabetes, cervical and lumbar fusion who presents with pain in her left hip and left leg cellulitis.  Clinical Impression  The patient actually mobilized much better than anticipated. The patient was very  Determined to ambulate. Plans To return to Independent  apartment at  [facility] and reports family and friends will stay. She wants to save rehab days for impending  Right THA in future. Pt admitted with above diagnosis. Pt currently with functional limitations due to the deficits listed below (see PT Problem List).  Pt will benefit from skilled PT to increase their independence and safety with mobility to allow discharge to the venue listed below.        Follow Up Recommendations Home health PT;Supervision/Assistance - 24 hour    Equipment Recommendations  None recommended by PT    Recommendations for Other Services       Precautions / Restrictions Precautions Precautions: Fall Precaution Comments: back surgery, painful left lower  leg and right hip is to be replaced soon.      Mobility  Bed Mobility Overal bed mobility: Needs Assistance Bed Mobility: Rolling;Sidelying to Sit Rolling: Min assist Sidelying to sit: Mod assist       General bed mobility comments: assist with trunk to upright position  Transfers Overall transfer level: Needs assistance Equipment used: 4-wheeled walker Transfers: Sit to/from Stand Sit to Stand: Min assist;Mod assist         General transfer comment: much extra time to rise from bed, supporting on Rollator.  Gradually able to stand more erect but continued with some trunk flexion.   Ambulation/Gait Ambulation/Gait assistance: Min assist;Mod assist Gait Distance (Feet): 55 Feet Assistive device:  4-wheeled walker Gait Pattern/deviations: Step-to pattern;Step-through pattern;Trunk flexed;Decreased step length - left Gait velocity: decr.   General Gait Details: decreased step length left initially, gradually increased  stride and  demonstrated  reciprocal gait.  Stairs            Wheelchair Mobility    Modified Rankin (Stroke Patients Only)       Balance                                             Pertinent Vitals/Pain Pain Assessment: Faces Faces Pain Scale: Hurts little more Pain Location: left leg, right hip Pain Descriptors / Indicators: Discomfort;Sore Pain Intervention(s): Monitored during session;Premedicated before session    [place] expects to be discharged to:: Private residence Living Arrangements: Alone Available Help at Discharge: Family;Friend(s) Type of Home: Apartment Home Access: Level entry     Home Layout: One level Home Equipment: [place] - 4 wheels;Walker - 2 wheels Additional Comments: daughter to stay    Prior Function Level of Independence: Independent with assistive device(s)         Comments: has  someone clean apt     Hand Dominance        Extremity/Trunk Assessment   Upper Extremity Assessment Upper Extremity Assessment: Defer to OT evaluation    Lower Extremity Assessment Lower Extremity Assessment: LLE deficits/detail;RLE deficits/detail RLE Deficits / Details: noted red lower leg. LLE Deficits / Details: audible crepitus of hip.  LLE Sensation: history of peripheral  neuropathy    Cervical / Trunk Assessment Cervical / Trunk Assessment: Other exceptions Cervical / Trunk Exceptions: forward flexed posture, especially when first standing  Communication   Communication: No difficulties  Cognition Arousal/Alertness: Awake/alert Behavior During Therapy: WFL for tasks assessed/performed Overall Cognitive Status: Within Functional Limits for tasks assessed                                         General Comments      Exercises     Assessment/Plan    PT Assessment Patient needs continued PT services  PT Problem List Decreased strength;Decreased range of motion;Decreased activity tolerance;Decreased knowledge of use of DME;Impaired sensation;Decreased balance;Decreased safety awareness;Decreased mobility;Decreased knowledge of precautions;Pain       PT Treatment Interventions DME instruction;Therapeutic exercise;Gait training;Functional mobility training;Therapeutic activities;Patient/family education    PT Goals (Current goals can be found in the Care Plan section)  Acute Rehab PT Goals Patient Stated Goal: to go home, get hip replaced in  Sept PT Goal Formulation: With patient Time For Goal Achievement: 01/13/18 Potential to Achieve Goals: Good    Frequency Min 3X/week   Barriers to discharge        Co-evaluation               AM-PAC PT "6 Clicks" Daily Activity  Outcome Measure Difficulty turning over in bed (including adjusting bedclothes, sheets and blankets)?: A Lot Difficulty moving from lying on back to sitting on the side of the bed? : A Lot Difficulty sitting down on and standing up from a chair with arms (e.g., wheelchair, bedside commode, etc,.)?: A Lot Help needed moving to and from a bed to chair (including a wheelchair)?: A Lot Help needed walking in hospital room?: Total Help needed climbing 3-5 steps with a railing? : Total 6 Click Score: 10    End of Session Equipment Utilized During Treatment: Gait belt Activity Tolerance: Patient tolerated treatment well Patient left: in chair;with call bell/phone within reach;with family/visitor present Nurse Communication: Mobility status PT Visit Diagnosis: Unsteadiness on feet (R26.81);Pain Pain - Right/Left: Right Pain - part of body: Hip    Time: 1145-1208 PT Time Calculation (min) (ACUTE ONLY): 23 min   Charges:   PT Evaluation $PT Eval Low  Complexity: 1 Low PT Treatments $Gait Training: 8-22 mins        [facility] [phone]   [name] 12/30/2017, 1:23 PM

## 2017-12-30 NOTE — Progress Notes (Signed)
PROGRESS NOTE    [name]  XFG:[id number] DOB: 1940-11-27 DOA: 12/28/2017 PCP: [name], DO     Brief Narrative:  [name] is a 77 year old female with medical history significant for arthritis, type 2 diabetes, cervical and lumbar fusion who presents with pain in her left hip.  She states that the pain started on 7/20, improved.  Then pain returned on Friday.  She is followed up with orthopedic surgery on 7/29 who placed her on Keflex for cellulitis.  Her pain and swelling progressively got worse despite oral antibiotics.  She denies any fevers or chills.  She was admitted for further treatment and evaluation of cellulitis.  New events last 24 hours / Subjective: Complains of delay in pain medication administration.  Continues to have pain in her left hip.  No other issues.  Assessment & Plan:   Active Problems:   Cellulitis  Lower extremity cellulitis -Sepsis ruled out  -Blood culture pending  -CT findings suggest cellulitis involving the left thigh without discrete drainable abscess, myofasciitis or pyomyositis. -Venous duplex negative for DVT -She did fail outpatient antibiotics with Keflex -Continue Ancef for now -Cellulitis slowly improving in appearance  Type 2 diabetes -Sliding scale insulin  HLD -Continue zetia   Overactive bladder  -Continue Myrbetriq    DVT prophylaxis: Lovenox Code Status: DNR Family Communication: No family at bedside Disposition Plan: Pending improvement of cellulitis, suspect she can return home.  Tinea IV antibiotics 1 more day.  Continue pain control.  PT OT evaluation pending    Consultants:   None  Procedures:   None  Antimicrobials:  Anti-infectives (From admission, onward)   Start     Dose/Rate Route Frequency Ordered Stop   12/28/17 2330  ceFAZolin (ANCEF) IVPB 1 g/50 mL premix     1 g 100 mL/hr over 30 Minutes Intravenous Every 8 hours 12/28/17 2005     12/28/17 1330  ceFAZolin (ANCEF) IVPB 1 g/50 mL  premix     1 g 100 mL/hr over 30 Minutes Intravenous  Once 12/28/17 1329 12/28/17 1603   12/28/17 1330  vancomycin (VANCOCIN) IVPB 1000 mg/200 mL premix     1,000 mg 200 mL/hr over 60 Minutes Intravenous  Once 12/28/17 1329 12/28/17 1730       Objective: Vitals:   12/29/17 2024 12/30/17 0500 12/30/17 0520 12/30/17 0737  BP: (!) 151/73  (!) 168/63   Pulse: 88  74   Resp: 19  18   Temp: 98.8 F (37.1 C)  98.4 F (36.9 C)   TempSrc: Oral  Oral   SpO2: 91%  93% 95%  Weight:  68.7 kg (151 lb 7.3 oz)    Height:        Intake/Output Summary (Last 24 hours) at 12/30/2017 1052 Last data filed at 12/30/2017 0800 Gross per 24 hour  Intake 240 ml  Output 1900 ml  Net -1660 ml   Filed Weights   12/28/17 1221 12/28/17 2001 12/30/17 0500  Weight: 68 kg (150 lb) 68.9 kg (152 lb) 68.7 kg (151 lb 7.3 oz)    Examination: General exam: Appears calm and comfortable  Respiratory system: Clear to auscultation. Respiratory effort normal. Cardiovascular system: S1 & S2 heard, RRR. No JVD, murmurs, rubs, gallops or clicks. No pedal edema. Gastrointestinal system: Abdomen is nondistended, soft and nontender. No organomegaly or masses felt. Normal bowel sounds heard. Central nervous system: Alert and oriented. No focal neurological deficits. Extremities: Symmetric 5 x 5 power. Skin: + Erythema of  left lower extremity with slight improvement in appearance.  Erythema of left hip and flank with improved appearance as well Psychiatry: Judgement and insight appear normal. Mood & affect appropriate.    Data Reviewed: I have personally reviewed following labs and imaging studies  CBC: Recent Labs  Lab 12/28/17 1328 12/29/17 0613 12/30/17 0610  WBC 7.0 6.9 5.2  NEUTROABS 4.7  --   --   HGB 11.2* 10.9* 10.9*  HCT 34.7* 33.9* 33.6*  MCV 88.5 88.3 89.4  PLT 239 230 161   Basic Metabolic Panel: Recent Labs  Lab 12/28/17 1328 12/29/17 0613 12/30/17 0610  NA 141 142 141  K 4.1 4.4 4.2  CL  109 109 109  CO2 24 26 24   GLUCOSE 102* 139* 120*  BUN 12 13 13   CREATININE 0.92 0.82 0.71  CALCIUM 9.5 8.9 8.7*  MG  --  2.2  --    GFR: Estimated Creatinine Clearance: 55.6 mL/min (by C-G formula based on SCr of 0.71 mg/dL). Liver Function Tests: Recent Labs  Lab 12/28/17 1328 12/29/17 0613  AST 28 21  ALT 24 20  ALKPHOS 57 50  BILITOT 0.7 0.6  PROT 7.5 6.1*  ALBUMIN 4.2 3.1*   No results for input(s): LIPASE, AMYLASE in the last 168 hours. No results for input(s): AMMONIA in the last 168 hours. Coagulation Profile: No results for input(s): INR, PROTIME in the last 168 hours. Cardiac Enzymes: No results for input(s): CKTOTAL, CKMB, CKMBINDEX, TROPONINI in the last 168 hours. BNP (last 3 results) No results for input(s): PROBNP in the last 8760 hours. HbA1C: No results for input(s): HGBA1C in the last 72 hours. CBG: Recent Labs  Lab 12/29/17 0739 12/29/17 1156 12/29/17 1629 12/29/17 1948 12/30/17 0750  GLUCAP 105* 204* 75 145* 118*   Lipid Profile: No results for input(s): CHOL, HDL, LDLCALC, TRIG, CHOLHDL, LDLDIRECT in the last 72 hours. Thyroid Function Tests: No results for input(s): TSH, T4TOTAL, FREET4, T3FREE, THYROIDAB in the last 72 hours. Anemia Panel: No results for input(s): VITAMINB12, FOLATE, FERRITIN, TIBC, IRON, RETICCTPCT in the last 72 hours. Sepsis Labs: Recent Labs  Lab 12/28/17 1441 12/28/17 1803  LATICACIDVEN 0.94 0.56    Recent Results (from the past 240 hour(s))  Culture, blood (routine x 2)     Status: None (Preliminary result)   Collection Time: 12/28/17  1:51 PM  Result Value Ref Range Status   Specimen Description   Final    BLOOD LEFT ANTECUBITAL Performed at [facility] [address] [place], [place] [address]    Special Requests   Final    BOTTLES DRAWN AEROBIC AND ANAEROBIC Blood Culture adequate volume Performed at [facility] [address], [place], [place] [address]     Culture   Final    NO GROWTH < 24 HOURS Performed at [facility] [address], [place], [place] [address]    Report Status PENDING  Incomplete  Culture, blood (routine x 2)     Status: None (Preliminary result)   Collection Time: 12/28/17  8:19 PM  Result Value Ref Range Status   Specimen Description SITE NOT SPECIFIED  Final   Special Requests   Final    BOTTLES DRAWN AEROBIC ONLY Blood Culture adequate volume   Culture   Final    NO GROWTH < 12 HOURS Performed at [facility], [address], [place], [place] [address]    Report Status PENDING  Incomplete  Radiology Studies: Ct Pelvis Wo Contrast  Result Date: 12/28/2017 CLINICAL DATA:  Left leg pain and left hip pain. Injured while opening a car door on July 2nd. History of total left hip arthroplasty and back surgery. EXAM: CT PELVIS WITHOUT CONTRAST TECHNIQUE: Multidetector CT imaging of the pelvis was performed following the standard protocol without intravenous contrast. COMPARISON:  Radiographs 12/25/2017 FINDINGS: The left total hip arthroplasty is well seated. No complicating features are identified. No evidence of loosening or periprosthetic fracture. No findings suspicious for particle disease or metallosis. Mild to moderate subcutaneous soft tissue swelling/edema/fluid involving the left upper thigh along with mild skin thickening is suspicious for cellulitis. No discrete fluid collection to suggest an abscess. No obvious findings for myofasciitis or pyomyositis. No hematoma or mass. The pubic symphysis and SI joints are intact. Mild degenerative changes. Extensive surgical changes involving the lower lumbar spine without obvious complicating features. Severe degenerative disease involving the right hip with joint space narrowing, bone-on-bone appearance, osteophytic spurring and subchondral cystic change. No fracture or AVN. No significant intrapelvic abnormalities are identified. No mass or adenopathy. The  uterus and ovaries appear normal. The bladder is grossly normal. No inguinal mass or inguinal hernia. Small scattered inguinal lymph nodes. IMPRESSION: 1. Findings suggest cellulitis involving the left thigh without discrete drainable abscess, myofasciitis or pyomyositis. 2. Well seated components of a total left hip arthroplasty without complicating features. 3. No significant intrapelvic abnormalities are identified. 4. Severe right hip joint degenerative changes. Electronically Signed   By: Marijo Sanes M.D.   On: 12/28/2017 15:46      Scheduled Meds: . enoxaparin (LOVENOX) injection  40 mg Subcutaneous Q24H  . ezetimibe  10 mg Oral Daily  . insulin aspart  0-5 Units Subcutaneous QHS  . insulin aspart  0-9 Units Subcutaneous TID WC  . mirabegron ER  50 mg Oral Daily  . mometasone-formoterol  2 puff Inhalation BID  . polyethylene glycol  17 g Oral Daily  . pregabalin  100 mg Oral TID   Continuous Infusions: .  ceFAZolin (ANCEF) IV 1 g (12/30/17 0757)     LOS: 2 days    Time spent: 25 minutes   Dessa Phi, DO [facility] [email or web address] Password [email or web address] 12/30/2017, 10:52 AM

## 2017-12-31 LAB — CBC
HCT: 34.4 % — ABNORMAL LOW (ref 36.0–46.0)
Hemoglobin: 11.2 g/dL — ABNORMAL LOW (ref 12.0–15.0)
MCH: 28.9 pg (ref 26.0–34.0)
MCHC: 32.6 g/dL (ref 30.0–36.0)
MCV: 88.7 fL (ref 78.0–100.0)
PLATELETS: 270 10*3/uL (ref 150–400)
RBC: 3.88 MIL/uL (ref 3.87–5.11)
RDW: 15 % (ref 11.5–15.5)
WBC: 6.3 10*3/uL (ref 4.0–10.5)

## 2017-12-31 LAB — BASIC METABOLIC PANEL
ANION GAP: 7 (ref 5–15)
BUN: 16 mg/dL (ref 8–23)
CALCIUM: 9 mg/dL (ref 8.9–10.3)
CO2: 25 mmol/L (ref 22–32)
CREATININE: 0.71 mg/dL (ref 0.44–1.00)
Chloride: 109 mmol/L (ref 98–111)
GFR calc Af Amer: >60 mL/min (ref >60)
Glucose, Bld: 116 mg/dL — ABNORMAL HIGH (ref 70–99)
Potassium: 4.3 mmol/L (ref 3.5–5.1)
Sodium: 141 mmol/L (ref 135–145)

## 2017-12-31 LAB — GLUCOSE, CAPILLARY
GLUCOSE-CAPILLARY: 101 mg/dL — AB (ref 70–99)
Glucose-Capillary: 116 mg/dL — ABNORMAL HIGH (ref 70–99)

## 2017-12-31 MED ORDER — CEPHALEXIN 500 MG PO CAPS: 500.0000 mg | ORAL_CAPSULE | Freq: Four times a day (QID) | ORAL | 0 refills | Status: AC

## 2017-12-31 MED ORDER — HYDROCODONE-ACETAMINOPHEN 5-325 MG PO TABS: 1.0000 | ORAL_TABLET | ORAL | 0 refills | Status: DC | PRN

## 2017-12-31 MED ORDER — TRAMADOL HCL 50 MG PO TABS: 50.0000 mg | ORAL_TABLET | Freq: Four times a day (QID) | ORAL | 0 refills | Status: AC | PRN

## 2017-12-31 NOTE — Discharge Instructions (Signed)

## 2017-12-31 NOTE — Evaluation (Signed)
Occupational Therapy Evaluation Patient Details Name: [name] MRN: [id number] DOB: [birth year] Today's Date: 12/31/2017    History of Present Illness [name] is a 77 year old female with medical history significant for arthritis, type 2 diabetes, cervical and lumbar fusion who presents with pain in her left hip and left leg cellulitis.   Clinical Impression   Met with pt lying supine in bed, agreeable to OT this date. Pt reports having had bladder accident 2/2 to [place] not functioning properly, RN staff aware. Supine <> sit completed with min-mod A to help "walk legs" to EOB and to pull to sitting. Sit <> stand completed with min A and RW with increased time. Extreme trunk flexion and some knee flexion noted with standing, pt not able to full correct. Took 3-5 steps to recliner using RW and min A with increased time. Noted pain in back and LLE after functional mobility. Pt engaged in oral care while seated in recliner with set up A. Pt will benefit from [facility] services to address the functional engagement of BADL in the home.     Follow Up Recommendations  Home health OT    Equipment Recommendations  None recommended by OT    Recommendations for Other Services       Precautions / Restrictions Precautions Precautions: Fall Precaution Comments: back surgery, painful left lower  leg and right hip is to be replaced soon. Restrictions Weight Bearing Restrictions: Yes      Mobility Bed Mobility Overal bed mobility: Needs Assistance Bed Mobility: Rolling;Sidelying to Sit Rolling: Min assist Sidelying to sit: Mod assist       General bed mobility comments: assist to walk legs to EOB and assist for trunk to upright position  Transfers Overall transfer level: Needs assistance Equipment used: Rolling walker (2 wheeled) Transfers: Sit to/from Stand Sit to Stand: Min assist         General transfer comment: extra time needed to rise from bed, trunk very flexed and knees very  flexed, can only extend so far    Balance Overall balance assessment: Mild deficits observed, not formally tested                                         ADL either performed or assessed with clinical judgement   ADL Overall ADL's : Needs assistance/impaired Eating/Feeding: Set up;Sitting   Grooming: Set up;Sitting;Oral care   Upper Body Bathing: Set up;Sitting   Lower Body Bathing: Moderate assistance;Sit to/from stand   Upper Body Dressing : Set up;Sitting   Lower Body Dressing: Moderate assistance;Sit to/from stand   Toilet Transfer: Minimal assistance;BSC;RW   Toileting- Clothing Manipulation and Hygiene: Minimal assistance;Sit to/from stand               Vision Baseline Vision/History: Wears glasses Wears Glasses: At all times Patient Visual Report: No change from baseline       Perception     Praxis      Pertinent Vitals/Pain Pain Assessment: Faces Faces Pain Scale: Hurts a little bit Pain Location: left leg, right hip with mobility, no pain initially Pain Descriptors / Indicators: Discomfort;Sore Pain Intervention(s): Monitored during session;Repositioned     Hand Dominance     Extremity/Trunk Assessment Upper Extremity Assessment Upper Extremity Assessment: Generalized weakness   Lower Extremity Assessment Lower Extremity Assessment: Generalized weakness       Communication Communication Communication: No difficulties  Cognition Arousal/Alertness: Awake/alert Behavior During Therapy: WFL for tasks assessed/performed Overall Cognitive Status: Within Functional Limits for tasks assessed                                     General Comments  red LLE, mild swelling at sock line    Exercises     Shoulder Instructions      Home Living Family/patient expects to be discharged to:: Private residence Living Arrangements: Alone Available Help at Discharge: Family;Friend(s) Type of Home: Independent living  facility Home Access: Level entry     Home Layout: One level     Bathroom Shower/Tub: Occupational psychologist: Handicapped height     Home Equipment: Environmental consultant - 4 wheels;Walker - 2 wheels   Additional Comments: daughter able to assist      Prior Functioning/Environment Level of Independence: Independent with assistive device(s)        Comments: has  someone clean apt        OT Problem List: Decreased strength;Impaired balance (sitting and/or standing);Pain;Decreased activity tolerance;Decreased knowledge of use of DME or AE      OT Treatment/Interventions: Self-care/ADL training;DME and/or AE instruction;Therapeutic activities;Patient/family education    OT Goals(Current goals can be found in the care plan section) Acute Rehab OT Goals Patient Stated Goal: to return home and get stronger OT Goal Formulation: With patient Time For Goal Achievement: 01/14/18 Potential to Achieve Goals: Good  OT Frequency: Min 2X/week   Barriers to D/C:            Co-evaluation              AM-PAC PT "6 Clicks" Daily Activity     Outcome Measure Help from another person eating meals?: A Little Help from another person taking care of personal grooming?: A Little Help from another person toileting, which includes using toliet, bedpan, or urinal?: A Little Help from another person bathing (including washing, rinsing, drying)?: A Lot Help from another person to put on and taking off regular upper body clothing?: A Little Help from another person to put on and taking off regular lower body clothing?: A Lot 6 Click Score: 16   End of Session Equipment Utilized During Treatment: Gait belt;Rolling walker Nurse Communication: Mobility status  Activity Tolerance: Patient tolerated treatment well Patient left: in chair;with call bell/phone within reach;with chair alarm set;with family/visitor present  OT Visit Diagnosis: Other abnormalities of gait and mobility  (R26.89);Muscle weakness (generalized) (M62.81);Pain Pain - Right/Left: Left Pain - part of body: Leg                Time: 2878-6767 OT Time Calculation (min): 24 min Charges:  OT General Charges $OT Visit: 1 Visit OT Evaluation $OT Eval Low Complexity: 1 Low OT Treatments $Self Care/Home Management : 8-22 mins  Zenovia Jarred, MSOT, OTR/L [phone]  Jeslyn Amsler 12/31/2017, 1:10 PM

## 2017-12-31 NOTE — Care Management Note (Signed)
Case Management Note  Patient Details  Name: [name] MRN: [id number] Date of Birth: [birth year]  Subjective/Objective:  Left leg cellulitis, left hip pain                  Action/Plan: NCM spoke to pt at bedside. Offered choice for HH/list provided. Pt requested [facility]. She had them in the past. [facility] unable to accept referral. [facility] with new referral. Pt has RW x2 and cane at home. She lives at [facility] in [place] own [place] apt[place]   Expected Discharge Date:  12/31/17               Expected Discharge Plan:  Opp  In-House Referral:  NA  Discharge planning Services  CM Consult  Post Acute Care Choice:  Home Health Choice offered to:  Patient  DME Arranged:  N/A DME Agency:  NA  HH Arranged:  PT [place] Agency:  [facility]  Status of Service:  Completed, signed off  If discussed at [place] of Stay Meetings, dates discussed:    Additional Comments:  Erenest Rasher, RN 12/31/2017, 1:24 PM

## 2017-12-31 NOTE — Progress Notes (Signed)
Nurse reviewed discharge instructions with pt.  Pt verbalized understanding of discharge instructions, follow up appointments and new medications.  No concerns at time of discharge. 

## 2017-12-31 NOTE — Discharge Summary (Signed)
Physician Discharge Summary  [name] URK:[id number] DOB: [birth year] DOA: 12/28/2017  PCP: [name], DO  Admit date: 12/28/2017 Discharge date: 12/31/2017  Admitted From: Home Disposition:  Home  Recommendations for Outpatient Follow-up:  1. Follow up with PCP in 1 week 2. Please follow up on the following pending results: final blood culture results   Home Health: PT   Equipment/Devices: None   Discharge Condition: Stable CODE STATUS: DNR  Diet recommendation: Carb modified   Brief/Interim Summary: [name] is a 77 year old female with medical history significant for arthritis, type 2 diabetes, cervical and lumbar fusion who presents with pain in her left hip.  She states that the pain started on 7/20, improved.  Then pain returned on Friday.  She is followed up with orthopedic surgery on 7/29 who placed her on Keflex for cellulitis.  Her pain and swelling progressively got worse despite oral antibiotics.  She denies any fevers or chills.  She was admitted for further treatment and evaluation of cellulitis.  Discharge Diagnoses:   Lower extremity cellulitis -Sepsis ruled out  -Blood culture negative to date -CT findings suggest cellulitis involving the left thigh without discrete drainable abscess, myofasciitis or pyomyositis. -Venous duplex negative for DVT -She did fail outpatient antibiotics with Keflex -Cellulitis improved with IV Ancef, will discharge home with higher dose of Keflex for 5 more days   Type 2 diabetes -Continue metformin   HLD -Continue zetia   Overactive bladder  -Continue Myrbetriq     Discharge Instructions  Discharge Instructions    Call MD for:  difficulty breathing, headache or visual disturbances   Complete by:  As directed    Call MD for:  extreme fatigue   Complete by:  As directed    Call MD for:  hives   Complete by:  As directed    Call MD for:  persistant dizziness or light-headedness   Complete by:  As directed     Call MD for:  persistant nausea and vomiting   Complete by:  As directed    Call MD for:  severe uncontrolled pain   Complete by:  As directed    Call MD for:  temperature >100.4   Complete by:  As directed    Diet Carb Modified   Complete by:  As directed    Increase activity slowly   Complete by:  As directed      Allergies as of 12/31/2017      Reactions   Statins Other (See Comments)   Myalgias and muscle weakness      Medication List    TAKE these medications   albuterol 108 (90 Base) MCG/ACT inhaler Commonly known as:  PROAIR HFA Inhale 2 puffs into the lungs every 6 (six) hours as needed for wheezing or shortness of breath.   alendronate 70 MG tablet Commonly known as:  FOSAMAX TAKE 1 TABLET(70 MG) BY MOUTH EVERY 7 DAYS WITH A FULL GLASS OF WATER AND ON AN EMPTY STOMACH   ASPERCREME EX Apply 1 application topically daily as needed (back pain).   Azelastine-Fluticasone 137-50 MCG/ACT Susp 2 sprays each nostril bid   CALCIUM 500 + D3 250-500 MG-UNIT Chew Generic drug:  Ca Phosphate-Cholecalciferol Chew by mouth.   celecoxib 200 MG capsule Commonly known as:  CELEBREX TAKE 1 CAPSULE(200 MG) BY MOUTH DAILY   cephALEXin 500 MG capsule Commonly known as:  KEFLEX Take 1 capsule (500 mg total) by mouth 4 (four) times daily for 5 days.  What changed:  when to take this   cholecalciferol 1000 units tablet Commonly known as:  VITAMIN D Take 1,000 Units by mouth daily.   ezetimibe 10 MG tablet Commonly known as:  ZETIA Take 1 tablet (10 mg total) by mouth daily.   Fluticasone-Salmeterol 250-50 MCG/DOSE Aepb Commonly known as:  ADVAIR DISKUS USE 1 INHALATION TWO TIMES  DAILY ; RINSE MOUTH   glucose blood test strip Commonly known as:  ONETOUCH VERIO Check blood sugar twice daily   HYDROcodone-acetaminophen 5-325 MG tablet Commonly known as:  NORCO/VICODIN Take 1-2 tablets by mouth every 4 (four) hours as needed for up to 3 days for moderate pain. What  changed:  when to take this   LYRICA 100 MG capsule Generic drug:  pregabalin TAKE ONE CAPSULE BY MOUTH TWICE DAILY   metFORMIN 500 MG 24 hr tablet Commonly known as:  GLUCOPHAGE-XR TAKE 1 TABLET(500 MG) BY MOUTH DAILY WITH BREAKFAST   methocarbamol 500 MG tablet Commonly known as:  ROBAXIN Take 1 tablet by mouth 3 (three) times daily.   multivitamin with minerals Tabs tablet Take 1 tablet by mouth daily.   MYRBETRIQ 50 MG Tb24 tablet Generic drug:  mirabegron ER TAKE 1 TABLET(50 MG) BY MOUTH DAILY   OCUVITE EXTRA PO Take 1 tablet by mouth daily.   ONETOUCH DELICA LANCETS 59D Misc Check blood sugar twice daily   SYSTANE OP Place 1 drop into both eyes daily as needed (dry eyes).   traMADol 50 MG tablet Commonly known as:  ULTRAM Take 1 tablet (50 mg total) by mouth every 6 (six) hours as needed for up to 3 days. What changed:    how much to take  how to take this  when to take this  reasons to take this      Follow-up Information    Carollee Herter, Alferd Apa, DO. Schedule an appointment as soon as possible for a visit in 1 week(s).   Specialty:  Family Medicine Contact information: [address] [place] [address] [phone]          Allergies  Allergen Reactions  . Statins Other (See Comments)    Myalgias and muscle weakness    Consultations:  None   Procedures/Studies: Ct Pelvis Wo Contrast  Result Date: 12/28/2017 CLINICAL DATA:  Left leg pain and left hip pain. Injured while opening a car door on July 2nd. History of total left hip arthroplasty and back surgery. EXAM: CT PELVIS WITHOUT CONTRAST TECHNIQUE: Multidetector CT imaging of the pelvis was performed following the standard protocol without intravenous contrast. COMPARISON:  Radiographs 12/25/2017 FINDINGS: The left total hip arthroplasty is well seated. No complicating features are identified. No evidence of loosening or periprosthetic fracture. No findings suspicious  for particle disease or metallosis. Mild to moderate subcutaneous soft tissue swelling/edema/fluid involving the left upper thigh along with mild skin thickening is suspicious for cellulitis. No discrete fluid collection to suggest an abscess. No obvious findings for myofasciitis or pyomyositis. No hematoma or mass. The pubic symphysis and SI joints are intact. Mild degenerative changes. Extensive surgical changes involving the lower lumbar spine without obvious complicating features. Severe degenerative disease involving the right hip with joint space narrowing, bone-on-bone appearance, osteophytic spurring and subchondral cystic change. No fracture or AVN. No significant intrapelvic abnormalities are identified. No mass or adenopathy. The uterus and ovaries appear normal. The bladder is grossly normal. No inguinal mass or inguinal hernia. Small scattered inguinal lymph nodes. IMPRESSION: 1. Findings suggest  cellulitis involving the left thigh without discrete drainable abscess, myofasciitis or pyomyositis. 2. Well seated components of a total left hip arthroplasty without complicating features. 3. No significant intrapelvic abnormalities are identified. 4. Severe right hip joint degenerative changes. Electronically Signed   By: Marijo Sanes M.D.   On: 12/28/2017 15:46   Xr Pelvis 1-2 Views  Result Date: 12/25/2017  AP pelvis: Well-seated left total hip components.  Hips well located.  No acute fracture.  Right hip with severe end-stage osteoarthritis of the right hip joint.  Xr Pelvis 1-2 Views  Result Date: 12/14/2017 An AP pelvis and lateral of the right hip shows severe profound end-stage arthritis of the right hip.  The hip ball appears to be almost deteriorating away.  There is flattening of the head and loss of the joint space completely.  This is changed dramatically from films that are reviewed from 2014 showing the right hip.     Discharge Exam: Vitals:   12/31/17 0612 12/31/17 0848  BP:  (!) 153/60   Pulse: 71   Resp: 16   Temp: 97.9 F (36.6 C)   SpO2: 99% 95%    General: Pt is alert, awake, not in acute distress Cardiovascular: RRR, S1/S2 +, no rubs, no gallops Respiratory: CTA bilaterally, no wheezing, no rhonchi Abdominal: Soft, NT, ND, bowel sounds + Extremities: no edema, no cyanosis Skin: Left lower leg cellulitis and erythema with improvement, left hip and flank erythema has now resolved     The results of significant diagnostics from this hospitalization (including imaging, microbiology, ancillary and laboratory) are listed below for reference.     Microbiology: Recent Results (from the past 240 hour(s))  Culture, blood (routine x 2)     Status: None (Preliminary result)   Collection Time: 12/28/17  1:51 PM  Result Value Ref Range Status   Specimen Description   Final    BLOOD LEFT ANTECUBITAL Performed at [facility] [address], [place], [place] [address]    Special Requests   Final    BOTTLES DRAWN AEROBIC AND ANAEROBIC Blood Culture adequate volume Performed at [facility] [address] [place], [place] [address]    Culture   Final    NO GROWTH 2 DAYS Performed at [facility] [address], [place], [place] [address]    Report Status PENDING  Incomplete  Culture, blood (routine x 2)     Status: None (Preliminary result)   Collection Time: 12/28/17  8:19 PM  Result Value Ref Range Status   Specimen Description SITE NOT SPECIFIED  Final   Special Requests   Final    BOTTLES DRAWN AEROBIC ONLY Blood Culture adequate volume   Culture   Final    NO GROWTH 2 DAYS Performed at [facility], [address], [place], [place] [address]    Report Status PENDING  Incomplete     Labs: BNP (last 3 results) No results for input(s): BNP in the last 8760 hours. Basic Metabolic Panel: Recent Labs  Lab 12/28/17 1328 12/29/17 0613 12/30/17 0610 12/31/17 0537  NA 141 142 141 141  K 4.1  4.4 4.2 4.3  CL 109 109 109 109  CO2 24 26 24 25   GLUCOSE 102* 139* 120* 116*  BUN 12 13 13 16   CREATININE 0.92 0.82 0.71 0.71  CALCIUM 9.5 8.9 8.7* 9.0  MG  --  2.2  --   --    Liver Function Tests: Recent Labs  Lab 12/28/17 1328 12/29/17 0613  AST 28 21  ALT 24 20  ALKPHOS 57 50  BILITOT 0.7 0.6  PROT 7.5 6.1*  ALBUMIN 4.2 3.1*   No results for input(s): LIPASE, AMYLASE in the last 168 hours. No results for input(s): AMMONIA in the last 168 hours. CBC: Recent Labs  Lab 12/28/17 1328 12/29/17 0613 12/30/17 0610 12/31/17 0537  WBC 7.0 6.9 5.2 6.3  NEUTROABS 4.7  --   --   --   HGB 11.2* 10.9* 10.9* 11.2*  HCT 34.7* 33.9* 33.6* 34.4*  MCV 88.5 88.3 89.4 88.7  PLT 239 230 246 270   Cardiac Enzymes: No results for input(s): CKTOTAL, CKMB, CKMBINDEX, TROPONINI in the last 168 hours. BNP: Invalid input(s): POCBNP CBG: Recent Labs  Lab 12/30/17 0750 12/30/17 1151 12/30/17 1722 12/30/17 2006 12/31/17 0745  GLUCAP 118* 105* 162* 111* 101*   D-Dimer No results for input(s): DDIMER in the last 72 hours. Hgb A1c No results for input(s): HGBA1C in the last 72 hours. Lipid Profile No results for input(s): CHOL, HDL, LDLCALC, TRIG, CHOLHDL, LDLDIRECT in the last 72 hours. Thyroid function studies No results for input(s): TSH, T4TOTAL, T3FREE, THYROIDAB in the last 72 hours.  Invalid input(s): FREET3 Anemia work up No results for input(s): VITAMINB12, FOLATE, FERRITIN, TIBC, IRON, RETICCTPCT in the last 72 hours. Urinalysis    Component Value Date/Time   COLORURINE COLORLESS (A) 12/28/2017 1328   APPEARANCEUR CLEAR 12/28/2017 1328   LABSPEC 1.003 (L) 12/28/2017 1328   PHURINE 7.0 12/28/2017 1328   GLUCOSEU NEGATIVE 12/28/2017 1328   GLUCOSEU NEGATIVE 06/03/2016 1213   HGBUR NEGATIVE 12/28/2017 [place] 12/28/2017 1328   BILIRUBINUR neg 12/05/2016 1244   KETONESUR 5 (A) 12/28/2017 1328   PROTEINUR NEGATIVE 12/28/2017 1328   UROBILINOGEN  0.2 12/05/2016 1244   UROBILINOGEN 0.2 06/03/2016 1213   NITRITE NEGATIVE 12/28/2017 1328   LEUKOCYTESUR NEGATIVE 12/28/2017 1328   Sepsis Labs Invalid input(s): PROCALCITONIN,  WBC,  LACTICIDVEN Microbiology Recent Results (from the past 240 hour(s))  Culture, blood (routine x 2)     Status: None (Preliminary result)   Collection Time: 12/28/17  1:51 PM  Result Value Ref Range Status   Specimen Description   Final    BLOOD LEFT ANTECUBITAL Performed at [facility], [place] [address] [place], [place] [address]    Special Requests   Final    BOTTLES DRAWN AEROBIC AND ANAEROBIC Blood Culture adequate volume Performed at [facility] [address], [place], [place] [address]    Culture   Final    NO GROWTH 2 DAYS Performed at [facility] [address], [place], [place] [address]    Report Status PENDING  Incomplete  Culture, blood (routine x 2)     Status: None (Preliminary result)   Collection Time: 12/28/17  8:19 PM  Result Value Ref Range Status   Specimen Description SITE NOT SPECIFIED  Final   Special Requests   Final    BOTTLES DRAWN AEROBIC ONLY Blood Culture adequate volume   Culture   Final    NO GROWTH 2 DAYS Performed at [facility], [address] [place],  [address]    Report Status PENDING  Incomplete     Patient was seen and examined on the day of discharge and was found to be in stable condition. Time coordinating discharge: 25 minutes including assessment and coordination of care, as well as examination of the  patient.   SIGNED:  Dessa Phi, DO [facility] Pager [phone]  If 7PM-7AM, please contact night-coverage [email or web address] Password [email or web address] 12/31/2017, 9:57 AM

## 2017-12-31 NOTE — Care Management Important Message (Signed)
Important Message  Patient Details  Name: [name] MRN: [id number] Date of Birth: 25-Mar-1941   Medicare Important Message Given:  Yes    Erenest Rasher, RN 12/31/2017, 10:45 AM

## 2018-01-01 ENCOUNTER — Telehealth: Payer: Self-pay

## 2018-01-01 NOTE — Telephone Encounter (Signed)
Reviewed

## 2018-01-01 NOTE — Telephone Encounter (Signed)
TCM hospital follow up call made to patient. Left message for a return call.

## 2018-01-01 NOTE — Telephone Encounter (Signed)
01/01/18   Transition Care Management Follow-up Telephone Call  ADMISSION DATE: 12/28/17 DISCHARGE DATE: 12/31/17  How have you been since you were released from the hospital?  Patient states she is feeling good and getting stronger.   Do you understand why you were in the hospital? Yes   Do you understand the discharge instrcutions? Yes    Items Reviewed:  Medications reviewed: Yes  Allergies reviewed: Yes  Dietary changes reviewed:Carbohydrate modified.    Referrals reviewed: Appointment scheduled with Dr. Carollee Herter.   Functional Questionnaire:   Activities of Daily Living (ADLs): Patient can perform all independently.  Any patient concerns? Not at this time    Confirmed importance and date/time of follow-up visits scheduled:Yes    Confirmed with patient if condition begins to worsen call PCP or go to the ER. Yes    Patient was given the office number and encouragred to call back with questions or concerns.Yes

## 2018-01-02 LAB — CULTURE, BLOOD (ROUTINE X 2)
CULTURE: NO GROWTH
Culture: NO GROWTH
Special Requests: ADEQUATE
Special Requests: ADEQUATE

## 2018-01-03 ENCOUNTER — Telehealth (INDEPENDENT_AMBULATORY_CARE_PROVIDER_SITE_OTHER): Payer: Self-pay | Admitting: Orthopaedic Surgery

## 2018-01-03 ENCOUNTER — Other Ambulatory Visit (INDEPENDENT_AMBULATORY_CARE_PROVIDER_SITE_OTHER): Payer: Self-pay | Admitting: Physician Assistant

## 2018-01-03 MED ORDER — HYDROCODONE-ACETAMINOPHEN 5-325 MG PO TABS: 1.0000 | ORAL_TABLET | ORAL | 0 refills | Status: DC | PRN

## 2018-01-03 NOTE — Telephone Encounter (Signed)
Patient aware Rx ready at front desk  

## 2018-01-03 NOTE — Telephone Encounter (Signed)
Please advise 

## 2018-01-03 NOTE — Telephone Encounter (Signed)
Patient requesting rx refill on hydrocodone. She states her leg is still swelling. Patients # [id number]

## 2018-01-03 NOTE — Telephone Encounter (Signed)
On your desk

## 2018-01-04 ENCOUNTER — Telehealth: Payer: Self-pay | Admitting: Family Medicine

## 2018-01-04 ENCOUNTER — Encounter: Payer: Self-pay | Admitting: Family Medicine

## 2018-01-04 ENCOUNTER — Telehealth: Payer: Self-pay | Admitting: *Deleted

## 2018-01-04 DIAGNOSIS — E119 Type 2 diabetes mellitus without complications: Secondary | ICD-10-CM | POA: Diagnosis not present

## 2018-01-04 DIAGNOSIS — M25751 Osteophyte, right hip: Secondary | ICD-10-CM | POA: Diagnosis not present

## 2018-01-04 DIAGNOSIS — D649 Anemia, unspecified: Secondary | ICD-10-CM | POA: Diagnosis not present

## 2018-01-04 DIAGNOSIS — Z96642 Presence of left artificial hip joint: Secondary | ICD-10-CM | POA: Diagnosis not present

## 2018-01-04 DIAGNOSIS — M1611 Unilateral primary osteoarthritis, right hip: Secondary | ICD-10-CM | POA: Diagnosis not present

## 2018-01-04 DIAGNOSIS — L03116 Cellulitis of left lower limb: Secondary | ICD-10-CM | POA: Diagnosis not present

## 2018-01-04 DIAGNOSIS — J45909 Unspecified asthma, uncomplicated: Secondary | ICD-10-CM | POA: Diagnosis not present

## 2018-01-04 DIAGNOSIS — Z86718 Personal history of other venous thrombosis and embolism: Secondary | ICD-10-CM | POA: Diagnosis not present

## 2018-01-04 DIAGNOSIS — K219 Gastro-esophageal reflux disease without esophagitis: Secondary | ICD-10-CM | POA: Diagnosis not present

## 2018-01-04 DIAGNOSIS — I10 Essential (primary) hypertension: Secondary | ICD-10-CM | POA: Diagnosis not present

## 2018-01-04 DIAGNOSIS — E785 Hyperlipidemia, unspecified: Secondary | ICD-10-CM | POA: Diagnosis not present

## 2018-01-04 DIAGNOSIS — Z86711 Personal history of pulmonary embolism: Secondary | ICD-10-CM | POA: Diagnosis not present

## 2018-01-04 DIAGNOSIS — Z7951 Long term (current) use of inhaled steroids: Secondary | ICD-10-CM | POA: Diagnosis not present

## 2018-01-04 DIAGNOSIS — Z7984 Long term (current) use of oral hypoglycemic drugs: Secondary | ICD-10-CM | POA: Diagnosis not present

## 2018-01-04 NOTE — Telephone Encounter (Signed)
OK to give VO as requested. Lyrica bid is fine and so is Myrebetriq daily prn but it is meant to be daily and it works better that way

## 2018-01-04 NOTE — Telephone Encounter (Signed)
Called Herbert Deaner with PT regarding patient. Left message, ok for PT per Dr. Charlett Blake . Advised that  Dr, Charlett Blake states Lyrica   bid was fine and Myrebetriq  daily prn was fine but works better as daily  Which is the way it is meant to be taken. Advised to return call if he is unclear about any of my message.

## 2018-01-04 NOTE — Telephone Encounter (Signed)
Copied from [place] [phone]. Topic: General - Other >> Jan 04, 2018 11:31 AM Lennox Solders wrote: Reason for CRM: jim hoffman PT adv home care is calling and needs verbal order for this pt to have physical therapy twice a wk for 3 wk for functional mobility also needs evaluation for a nurse to see patient  due to cellulitis and dm management. Clair Gulling would like to clarify medication lyrica. Pt is taking three times a day and discharge paper from hospital states twice a day. Clair Gulling also need clarification on myrbetriq discharge paper  has it as daily use and patient is taking as needed

## 2018-01-04 NOTE — Telephone Encounter (Signed)
Received Physician Orders from [facility]; forwarded to provider/SLS 08/08  

## 2018-01-05 ENCOUNTER — Telehealth: Payer: Self-pay | Admitting: *Deleted

## 2018-01-05 NOTE — Telephone Encounter (Signed)
Received Physician Orders from [facility]; forwarded to provider/SLS 08/09

## 2018-01-09 ENCOUNTER — Telehealth: Payer: Self-pay | Admitting: Family Medicine

## 2018-01-09 ENCOUNTER — Other Ambulatory Visit: Payer: Self-pay | Admitting: Family Medicine

## 2018-01-09 DIAGNOSIS — M1611 Unilateral primary osteoarthritis, right hip: Secondary | ICD-10-CM | POA: Diagnosis not present

## 2018-01-09 DIAGNOSIS — M25751 Osteophyte, right hip: Secondary | ICD-10-CM | POA: Diagnosis not present

## 2018-01-09 DIAGNOSIS — Z7984 Long term (current) use of oral hypoglycemic drugs: Secondary | ICD-10-CM | POA: Diagnosis not present

## 2018-01-09 DIAGNOSIS — L03116 Cellulitis of left lower limb: Secondary | ICD-10-CM | POA: Diagnosis not present

## 2018-01-09 DIAGNOSIS — Z96642 Presence of left artificial hip joint: Secondary | ICD-10-CM | POA: Diagnosis not present

## 2018-01-09 DIAGNOSIS — D649 Anemia, unspecified: Secondary | ICD-10-CM | POA: Diagnosis not present

## 2018-01-09 DIAGNOSIS — Z86718 Personal history of other venous thrombosis and embolism: Secondary | ICD-10-CM | POA: Diagnosis not present

## 2018-01-09 DIAGNOSIS — E1140 Type 2 diabetes mellitus with diabetic neuropathy, unspecified: Secondary | ICD-10-CM

## 2018-01-09 DIAGNOSIS — J45909 Unspecified asthma, uncomplicated: Secondary | ICD-10-CM | POA: Diagnosis not present

## 2018-01-09 DIAGNOSIS — Z86711 Personal history of pulmonary embolism: Secondary | ICD-10-CM | POA: Diagnosis not present

## 2018-01-09 DIAGNOSIS — K219 Gastro-esophageal reflux disease without esophagitis: Secondary | ICD-10-CM | POA: Diagnosis not present

## 2018-01-09 DIAGNOSIS — Z7951 Long term (current) use of inhaled steroids: Secondary | ICD-10-CM | POA: Diagnosis not present

## 2018-01-09 DIAGNOSIS — E119 Type 2 diabetes mellitus without complications: Secondary | ICD-10-CM | POA: Diagnosis not present

## 2018-01-09 DIAGNOSIS — E785 Hyperlipidemia, unspecified: Secondary | ICD-10-CM | POA: Diagnosis not present

## 2018-01-09 DIAGNOSIS — I10 Essential (primary) hypertension: Secondary | ICD-10-CM | POA: Diagnosis not present

## 2018-01-09 NOTE — Telephone Encounter (Signed)
Received rx from pharmacy request is already being worked on.

## 2018-01-09 NOTE — Telephone Encounter (Unsigned)
Copied from [place] [phone]. Topic: Quick Communication - See Telephone Encounter >> Jan 09, 2018  2:33 PM Neva Seat wrote: Margaretha Sheffield RN w/ [facility] - [phone]  Skilled nursing orders for disease management and medication teaching 1 time a week for 4 weeks.

## 2018-01-09 NOTE — Telephone Encounter (Signed)
Verbal order given  

## 2018-01-09 NOTE — Telephone Encounter (Unsigned)
Copied from [place] [phone]. Topic: Quick Communication - Rx Refill/Question >> Jan 09, 2018  3:57 PM Judyann Munson wrote: Medication:LYRICA 100 MG capsule  Has the patient contacted their pharmacy? No  Preferred Pharmacy (with phone number or street name): [facility] [place], [address] AT [address] [place] [phone] (Phone) [phone] (Fax)    Agent: Please be advised that RX refills may take up to 3 business days. We ask that you follow-up with your pharmacy.

## 2018-01-11 ENCOUNTER — Telehealth: Payer: Self-pay | Admitting: *Deleted

## 2018-01-11 ENCOUNTER — Encounter (INDEPENDENT_AMBULATORY_CARE_PROVIDER_SITE_OTHER): Payer: Self-pay | Admitting: Orthopaedic Surgery

## 2018-01-11 ENCOUNTER — Ambulatory Visit (INDEPENDENT_AMBULATORY_CARE_PROVIDER_SITE_OTHER): Payer: Medicare Other | Admitting: Orthopaedic Surgery

## 2018-01-11 ENCOUNTER — Other Ambulatory Visit: Payer: Self-pay | Admitting: Family Medicine

## 2018-01-11 DIAGNOSIS — L03116 Cellulitis of left lower limb: Secondary | ICD-10-CM

## 2018-01-11 DIAGNOSIS — M79605 Pain in left leg: Secondary | ICD-10-CM | POA: Diagnosis not present

## 2018-01-11 DIAGNOSIS — E1140 Type 2 diabetes mellitus with diabetic neuropathy, unspecified: Secondary | ICD-10-CM

## 2018-01-11 MED ORDER — CEPHALEXIN 500 MG PO CAPS: 500.0000 mg | ORAL_CAPSULE | Freq: Four times a day (QID) | ORAL | 1 refills | Status: DC

## 2018-01-11 NOTE — Telephone Encounter (Signed)
Patient called and wanted me to let you know that she will be out of this medication before she can come in to see Dr Etter Sjogren on Monday and doesn't want to be forgotten

## 2018-01-11 NOTE — Progress Notes (Signed)
The patient is coming in for continued follow-up.  She has severe osteoarthritis of her right hip and is in need of right hip replacement.  This is been well documented.  She has had a successful left total hip arthroplasty.  However right now she is been dealing with cellulitis involving left lower extremity.  The CT scan recently obtained showed a well-seated implant of a total hip arthroplasty on the left hip and severe osteoarthritis of the right hip.  He did show cellulitis and thigh but no discernible abscess.  On exam she still does have cellulitis in the left lower extremity.  She understands that we cannot proceed with a hip replacement until this is resolved.  She is going to start Keflex 500 mg 4 times daily and compressive garments.  I would like to reevaluate her in 2 weeks.  All questions concerns were answered and addressed.

## 2018-01-11 NOTE — Telephone Encounter (Signed)
Refill request was received from pharmacy and is being reviewed.

## 2018-01-11 NOTE — Telephone Encounter (Signed)
Received Home Health Certification and Plan of Care; forwarded to provider/SLS 08/15  Received Physician Orders from [facility]; forwarded to provider/SLS 08/15

## 2018-01-11 NOTE — Telephone Encounter (Signed)
Database ran on 12/01/17 and is in media for review  Last written: 12/14/17 Last ov: 12/11/17 Next ov: 01/15/18 Contract: 12/12/18 UDS: 06/13/17

## 2018-01-12 DIAGNOSIS — M25751 Osteophyte, right hip: Secondary | ICD-10-CM | POA: Diagnosis not present

## 2018-01-12 DIAGNOSIS — Z86718 Personal history of other venous thrombosis and embolism: Secondary | ICD-10-CM | POA: Diagnosis not present

## 2018-01-12 DIAGNOSIS — Z86711 Personal history of pulmonary embolism: Secondary | ICD-10-CM | POA: Diagnosis not present

## 2018-01-12 DIAGNOSIS — E119 Type 2 diabetes mellitus without complications: Secondary | ICD-10-CM | POA: Diagnosis not present

## 2018-01-12 DIAGNOSIS — Z96642 Presence of left artificial hip joint: Secondary | ICD-10-CM | POA: Diagnosis not present

## 2018-01-12 DIAGNOSIS — I10 Essential (primary) hypertension: Secondary | ICD-10-CM | POA: Diagnosis not present

## 2018-01-12 DIAGNOSIS — L03116 Cellulitis of left lower limb: Secondary | ICD-10-CM | POA: Diagnosis not present

## 2018-01-12 DIAGNOSIS — E785 Hyperlipidemia, unspecified: Secondary | ICD-10-CM | POA: Diagnosis not present

## 2018-01-12 DIAGNOSIS — Z7951 Long term (current) use of inhaled steroids: Secondary | ICD-10-CM | POA: Diagnosis not present

## 2018-01-12 DIAGNOSIS — K219 Gastro-esophageal reflux disease without esophagitis: Secondary | ICD-10-CM | POA: Diagnosis not present

## 2018-01-12 DIAGNOSIS — J45909 Unspecified asthma, uncomplicated: Secondary | ICD-10-CM | POA: Diagnosis not present

## 2018-01-12 DIAGNOSIS — M1611 Unilateral primary osteoarthritis, right hip: Secondary | ICD-10-CM | POA: Diagnosis not present

## 2018-01-12 DIAGNOSIS — D649 Anemia, unspecified: Secondary | ICD-10-CM | POA: Diagnosis not present

## 2018-01-12 DIAGNOSIS — Z7984 Long term (current) use of oral hypoglycemic drugs: Secondary | ICD-10-CM | POA: Diagnosis not present

## 2018-01-15 ENCOUNTER — Ambulatory Visit (INDEPENDENT_AMBULATORY_CARE_PROVIDER_SITE_OTHER): Payer: Medicare Other | Admitting: Family Medicine

## 2018-01-15 ENCOUNTER — Encounter: Payer: Self-pay | Admitting: Family Medicine

## 2018-01-15 VITALS — BP 132/58 | HR 69 | Temp 98.9°F | Resp 16 | Ht 64.0 in | Wt 151.8 lb

## 2018-01-15 DIAGNOSIS — R350 Frequency of micturition: Secondary | ICD-10-CM

## 2018-01-15 DIAGNOSIS — M545 Low back pain, unspecified: Secondary | ICD-10-CM

## 2018-01-15 DIAGNOSIS — E1140 Type 2 diabetes mellitus with diabetic neuropathy, unspecified: Secondary | ICD-10-CM

## 2018-01-15 DIAGNOSIS — L03116 Cellulitis of left lower limb: Secondary | ICD-10-CM | POA: Diagnosis not present

## 2018-01-15 LAB — POC URINALSYSI DIPSTICK (AUTOMATED)
BILIRUBIN UA: NEGATIVE
GLUCOSE UA: NEGATIVE
Ketones, UA: NEGATIVE
Leukocytes, UA: NEGATIVE
NITRITE UA: NEGATIVE
Protein, UA: NEGATIVE
RBC UA: NEGATIVE
Spec Grav, UA: 1.015 (ref 1.010–1.025)
Urobilinogen, UA: 0.2 E.U./dL
pH, UA: 6 (ref 5.0–8.0)

## 2018-01-15 MED ORDER — CYCLOBENZAPRINE HCL 10 MG PO TABS: 10.0000 mg | ORAL_TABLET | Freq: Three times a day (TID) | ORAL | 0 refills | Status: DC | PRN

## 2018-01-15 MED ORDER — PREGABALIN 100 MG PO CAPS: 100.0000 mg | ORAL_CAPSULE | Freq: Three times a day (TID) | ORAL | 1 refills | Status: DC

## 2018-01-15 NOTE — Patient Instructions (Signed)

## 2018-01-15 NOTE — Progress Notes (Signed)
Patient ID: [name], female   DOB: Dec 08, 1940, 77 y.o.   MRN: [id number]     Subjective:  I acted as a Education administrator for Dr. Carollee Herter.  [name], [place]   Patient ID: [name], female    DOB: [birth year], 77 y.o.   MRN: [id number]  Chief Complaint  Patient presents with  . Hospitalization Follow-up    8/1-8/4     HPI .   Patient is in today for hospital follow up for cellulitis L Low ext.  On keflex  Has f/u with ortho in 2 weeks  Pt also c/o urinary frequency for the last several days     Patient Care Team: Carollee Herter, Alferd Apa, DO as PCP - General (Family Medicine) Deneise Lever, MD as Consulting Physician (Pulmonary Disease) Erline Levine, MD as Consulting Physician (Neurosurgery) Marlou Sa, Tonna Corner, MD as Consulting Physician (Orthopedic Surgery) Mcarthur Rossetti, MD as Consulting Physician (Orthopedic Surgery) Danella Sensing, MD as Consulting Physician (Dermatology)   Past Medical History:  Diagnosis Date  . Anemia   . Arthritis   . Asthma    adult onset  . Basal cell cancer    LUE; Porokeratosis also  . Chronic kidney disease 1963   strep in kidney due to strep throat-hospitalized 10 days  . Complication of anesthesia    small trachea  . Diabetes mellitus 2010   A1c 6.7%  . DVT (deep venous thrombosis) ([place]) 2006   post immobilization post cns surgery  . GERD (gastroesophageal reflux disease)    very mild  . Granulomatous lung disease ([place]) 2002   incidental Xray finding  . Hyperlipidemia   . Hypertension 2004   Hypertensive response on Stress Test  . Paralysis ([place]) 2006   post cervical fusion with spinal sac tear  with hematoma   . PTE (pulmonary thromboembolism) ([place]) 2006    Past Surgical History:  Procedure Laterality Date  . ABDOMINAL EXPOSURE N/A 08/12/2016   Procedure: ABDOMINAL EXPOSURE;  Surgeon: Rosetta Posner, MD;  Location: [facility] [place] [facility];  Service: Vascular;  Laterality: N/A;  . ANTERIOR LAT LUMBAR FUSION N/A 08/12/2016   Procedure: LUMBAR  TWO-THREE, LUMBAR THREE-FOUR, LUMBAR FOUR-FIVE  ANTEROLATERAL LUMBAR INTERBODY FUSION;  Surgeon: Erline Levine, MD;  Location: [facility];  Service: Neurosurgery;  Laterality: N/A;  L2-3 L3-4 L4-5 Anterolateral lumbar interbody fusion  . ANTERIOR LUMBAR FUSION N/A 08/12/2016   Procedure: Lumbar Five-Sacral One Anterior lumbar interbody fusion with Dr. Sherren Mocha Early to assist;  Surgeon: Erline Levine, MD;  Location: [facility];  Service: Neurosurgery;  Laterality: N/A;  L5-S1 Anterior lumbar interbody fusion with Dr. Sherren Mocha Early to assist  . BUNIONECTOMY    . CERVICAL FUSION  2006   Dr Annette Stable, NS;post op hematoma & cns leak & urinary retention  . COLONOSCOPY  1992 & 2002   negative  . epidural steroids  2006   cervical spine  . LUMBAR PERCUTANEOUS PEDICLE SCREW 4 LEVEL N/A 08/12/2016   Procedure: LUMBAR TWO-SACRAL ONE Percuataneous Pedicle Screws;  Surgeon: Erline Levine, MD;  Location: [facility];  Service: Neurosurgery;  Laterality: N/A;  . ROTATOR CUFF REPAIR  2009   Right  . SEPTOPLASTY    . TONSILLECTOMY  77 years old  . TOTAL HIP ARTHROPLASTY  06/01/2012   Procedure: TOTAL HIP ARTHROPLASTY ANTERIOR APPROACH;  Surgeon: Mcarthur Rossetti, MD;  Location: [facility];  Service: Orthopedics;  Laterality: Left;  Left Total Hip Arthroplasty  . TUBAL LIGATION      Family History  Problem  Relation Age of Onset  . COPD Father        emphysema  . Cancer Mother        cns cancer  . Diabetes Sister        TWIN sister ; also Fibromyalgia ; S/P stent 2004  . Heart disease Sister        stents @ 67 & 38  . Stroke Maternal Grandmother        in  late 71s  . Transient ischemic attack Paternal Aunt     Social History   Socioeconomic History  . Marital status: Divorced    Spouse name: Not on file  . Number of children: Not on file  . Years of education: Not on file  . Highest education level: Not on file  Occupational History  . Not on file  Social Needs  . Financial resource strain: Not on file  . Food  insecurity:    Worry: Not on file    Inability: Not on file  . Transportation needs:    Medical: Not on file    Non-medical: Not on file  Tobacco Use  . Smoking status: Never Smoker  . Smokeless tobacco: Never Used  Substance and Sexual Activity  . Alcohol use: Yes    Comment: twice a week; 2-4/week  . Drug use: No  . Sexual activity: Never  Lifestyle  . Physical activity:    Days per week: Not on file    Minutes per session: Not on file  . Stress: Not on file  Relationships  . Social connections:    Talks on phone: Not on file    Gets together: Not on file    Attends religious service: Not on file    Active member of club or organization: Not on file    Attends meetings of clubs or organizations: Not on file    Relationship status: Not on file  . Intimate partner violence:    Fear of current or ex partner: Not on file    Emotionally abused: Not on file    Physically abused: Not on file    Forced sexual activity: Not on file  Other Topics Concern  . Not on file  Social History Narrative  . Not on file    Outpatient Medications Prior to Visit  Medication Sig Dispense Refill  . alendronate (FOSAMAX) 70 MG tablet TAKE 1 TABLET(70 MG) BY MOUTH EVERY 7 DAYS WITH A FULL GLASS OF WATER AND ON AN EMPTY STOMACH 12 tablet 0  . Azelastine-Fluticasone 137-50 MCG/ACT SUSP 2 sprays each nostril bid 1 Bottle 5  . Ca Phosphate-Cholecalciferol (CALCIUM 500 + D3) 250-500 MG-UNIT CHEW Chew by mouth.    . celecoxib (CELEBREX) 200 MG capsule TAKE 1 CAPSULE(200 MG) BY MOUTH DAILY 90 capsule 0  . cephALEXin (KEFLEX) 500 MG capsule Take 1 capsule (500 mg total) by mouth 4 (four) times daily. 90 capsule 1  . cholecalciferol (VITAMIN D) 1000 UNITS tablet Take 1,000 Units by mouth daily.     [name] ezetimibe (ZETIA) 10 MG tablet Take 1 tablet (10 mg total) by mouth daily. 30 tablet 2  . Fluticasone-Salmeterol (ADVAIR DISKUS) 250-50 MCG/DOSE AEPB USE 1 INHALATION TWO TIMES  DAILY ; RINSE MOUTH 180 each  3  . glucose blood (ONETOUCH VERIO) test strip Check blood sugar twice daily 200 each 1  . metFORMIN (GLUCOPHAGE-XR) 500 MG 24 hr tablet TAKE 1 TABLET(500 MG) BY MOUTH DAILY WITH BREAKFAST 90 tablet 0  . methocarbamol (ROBAXIN)  500 MG tablet Take 1 tablet by mouth 3 (three) times daily.  0  . Multiple Vitamin (MULTIVITAMIN WITH MINERALS) TABS tablet Take 1 tablet by mouth daily.    . Multiple Vitamins-Minerals (OCUVITE EXTRA PO) Take 1 tablet by mouth daily.    [name] MYRBETRIQ 50 MG TB24 tablet TAKE 1 TABLET(50 MG) BY MOUTH DAILY 30 tablet 0  . ONETOUCH DELICA LANCETS 18H MISC Check blood sugar twice daily 200 each 1  . Polyethyl Glycol-Propyl Glycol (SYSTANE OP) Place 1 drop into both eyes daily as needed (dry eyes).    Loura Pardon Salicylate (ASPERCREME EX) Apply 1 application topically daily as needed (back pain).    . pregabalin (LYRICA) 100 MG capsule TAKE 1 CAPSULE BY MOUTH TWICE DAILY 180 capsule 0  . albuterol (PROAIR HFA) 108 (90 Base) MCG/ACT inhaler Inhale 2 puffs into the lungs every 6 (six) hours as needed for wheezing or shortness of breath. 3 Inhaler 3  . HYDROcodone-acetaminophen (NORCO/VICODIN) 5-325 MG tablet Take 1-2 tablets by mouth every 4 (four) hours as needed for moderate pain. 30 tablet 0   No facility-administered medications prior to visit.     Allergies  Allergen Reactions  . Statins Other (See Comments)    Myalgias and muscle weakness    Review of Systems  Constitutional: Negative for chills, fever and malaise/fatigue.  HENT: Negative for congestion and hearing loss.   Eyes: Negative for discharge.  Respiratory: Negative for cough, sputum production and shortness of breath.   Cardiovascular: Negative for chest pain, palpitations and leg swelling.  Gastrointestinal: Negative for abdominal pain, blood in stool, constipation, diarrhea, heartburn, nausea and vomiting.  Genitourinary: Negative for dysuria, frequency, hematuria and urgency.  Musculoskeletal: Positive  for back pain and joint pain. Negative for falls and myalgias.  Skin: Negative for rash.  Neurological: Negative for dizziness, sensory change, loss of consciousness, weakness and headaches.  Endo/Heme/Allergies: Negative for environmental allergies. Does not bruise/bleed easily.  Psychiatric/Behavioral: Negative for depression and suicidal ideas. The patient is not nervous/anxious and does not have insomnia.        Objective:    Physical Exam  Constitutional: She is oriented to person, place, and time. She appears well-developed and well-nourished.  HENT:  Head: Normocephalic and atraumatic.  Eyes: Conjunctivae and EOM are normal.  Neck: Normal range of motion. Neck supple. No JVD present. Carotid bruit is not present. No thyromegaly present.  Cardiovascular: Normal rate, regular rhythm and normal heart sounds.  No murmur heard. Pulmonary/Chest: Effort normal and breath sounds normal. No respiratory distress. She has no wheezes. She has no rales. She exhibits no tenderness.  Musculoskeletal: She exhibits no edema.  Pt wearing compression sock  Neurological: She is alert and oriented to person, place, and time.  Skin: Skin is warm and dry. No rash noted. No erythema.  Psychiatric: She has a normal mood and affect.  Nursing note and vitals reviewed.   BP (!) 132/58 (BP Location: Left Arm, Cuff Size: Normal)   Pulse 69   Temp 98.9 F (37.2 C) (Oral)   Resp 16   Ht 5\' 4"  (1.626 m)   Wt 151 lb 12.8 oz (68.9 kg)   SpO2 99%   BMI 26.06 kg/m  Wt Readings from Last 3 Encounters:  01/15/18 151 lb 12.8 oz (68.9 kg)  12/30/17 151 lb 7.3 oz (68.7 kg)  12/11/17 153 lb (69.4 kg)   BP Readings from Last 3 Encounters:  01/15/18 (!) 132/58  12/31/17 (!) 153/60  12/11/17 (!) 120/50  Immunization History  Administered Date(s) Administered  . Influenza Split 03/15/2012  . Influenza Whole 02/27/2009  . Influenza, High Dose Seasonal PF 03/19/2013, 03/21/2016  . Influenza,inj,Quad  PF,6+ Mos 03/13/2014, 02/16/2015  . Influenza-Unspecified 03/15/2012, 01/28/2014, 03/13/2014, 02/16/2015, 02/14/2017  . Pneumococcal Conjugate-13 05/16/2013  . Pneumococcal Polysaccharide-23 09/29/2014  . Pneumococcal-Unspecified 05/16/2013  . Tdap 10/28/2010  . Zoster 05/12/2014    Health Maintenance  Topic Date Due  . PNA vac Low Risk Adult (2 of 2 - PCV13) 09/29/2015  . OPHTHALMOLOGY EXAM  11/21/2017  . MAMMOGRAM  01/12/2018  . INFLUENZA VACCINE  12/28/2017  . FOOT EXAM  06/08/2018  . HEMOGLOBIN A1C  06/13/2018  . URINE MICROALBUMIN  12/12/2018  . TETANUS/TDAP  10/27/2020  . DEXA SCAN  Completed    Lab Results  Component Value Date   WBC 6.3 12/31/2017   HGB 11.2 (L) 12/31/2017   HCT 34.4 (L) 12/31/2017   PLT 270 12/31/2017   GLUCOSE 116 (H) 12/31/2017   CHOL 249 (H) 12/11/2017   TRIG 336.0 (H) 12/11/2017   HDL 52.80 12/11/2017   LDLDIRECT 127.0 12/11/2017   LDLCALC 134 (H) 06/08/2017   ALT 20 12/29/2017   AST 21 12/29/2017   NA 141 12/31/2017   K 4.3 12/31/2017   CL 109 12/31/2017   CREATININE 0.71 12/31/2017   BUN 16 12/31/2017   CO2 25 12/31/2017   TSH 5.38 (H) 06/03/2016   INR 0.96 05/28/2012   HGBA1C 6.5 12/11/2017   MICROALBUR <0.7 12/11/2017    Lab Results  Component Value Date   TSH 5.38 (H) 06/03/2016   Lab Results  Component Value Date   WBC 6.3 12/31/2017   HGB 11.2 (L) 12/31/2017   HCT 34.4 (L) 12/31/2017   MCV 88.7 12/31/2017   PLT 270 12/31/2017   Lab Results  Component Value Date   NA 141 12/31/2017   K 4.3 12/31/2017   CO2 25 12/31/2017   GLUCOSE 116 (H) 12/31/2017   BUN 16 12/31/2017   CREATININE 0.71 12/31/2017   BILITOT 0.6 12/29/2017   ALKPHOS 50 12/29/2017   AST 21 12/29/2017   ALT 20 12/29/2017   PROT 6.1 (L) 12/29/2017   ALBUMIN 3.1 (L) 12/29/2017   CALCIUM 9.0 12/31/2017   ANIONGAP 7 12/31/2017   GFR 59.21 (L) 12/11/2017   Lab Results  Component Value Date   CHOL 249 (H) 12/11/2017   Lab Results  Component  Value Date   HDL 52.80 12/11/2017   Lab Results  Component Value Date   LDLCALC 134 (H) 06/08/2017   Lab Results  Component Value Date   TRIG 336.0 (H) 12/11/2017   Lab Results  Component Value Date   CHOLHDL 5 12/11/2017   Lab Results  Component Value Date   HGBA1C 6.5 12/11/2017         Assessment & Plan:   Problem List Items Addressed This Visit      Unprioritized   Cellulitis - Primary    Finish abx per ortho F/u ortho 2 weeks      Low back pain   Relevant Medications   cyclobenzaprine (FLEXERIL) 10 MG tablet   Type 2 diabetes mellitus with diabetic neuropathy (HCC)   Relevant Medications   pregabalin (LYRICA) 100 MG capsule    Other Visit Diagnoses    Urinary frequency       Relevant Orders   POCT Urinalysis Dipstick (Automated) (Completed)      I have discontinued Zigmund Daniel E. Ursin's HYDROcodone-acetaminophen. I have also changed her  pregabalin. Additionally, I am having her start on cyclobenzaprine. Lastly, I am having her maintain her cholecalciferol, albuterol, Ca Phosphate-Cholecalciferol, Multiple Vitamins-Minerals (OCUVITE EXTRA PO), multivitamin with minerals, methocarbamol, Azelastine-Fluticasone, Fluticasone-Salmeterol, glucose blood, ONETOUCH DELICA LANCETS 34D, alendronate, metFORMIN, MYRBETRIQ, ezetimibe, celecoxib, Trolamine Salicylate (ASPERCREME EX), Polyethyl Glycol-Propyl Glycol (SYSTANE OP), and cephALEXin.  Meds ordered this encounter  Medications  . cyclobenzaprine (FLEXERIL) 10 MG tablet    Sig: Take 1 tablet (10 mg total) by mouth 3 (three) times daily as needed for muscle spasms.    Dispense:  30 tablet    Refill:  0  . pregabalin (LYRICA) 100 MG capsule    Sig: Take 1 capsule (100 mg total) by mouth 3 (three) times daily.    Dispense:  270 capsule    Refill:  1    CMA served as scribe during this visit. History, Physical and Plan performed by medical provider. Documentation and orders reviewed and attested to.  [name], DO

## 2018-01-15 NOTE — Assessment & Plan Note (Signed)
Finish abx per ortho F/u ortho 2 weeks

## 2018-01-16 DIAGNOSIS — J45909 Unspecified asthma, uncomplicated: Secondary | ICD-10-CM | POA: Diagnosis not present

## 2018-01-16 DIAGNOSIS — Z7984 Long term (current) use of oral hypoglycemic drugs: Secondary | ICD-10-CM | POA: Diagnosis not present

## 2018-01-16 DIAGNOSIS — K219 Gastro-esophageal reflux disease without esophagitis: Secondary | ICD-10-CM | POA: Diagnosis not present

## 2018-01-16 DIAGNOSIS — E785 Hyperlipidemia, unspecified: Secondary | ICD-10-CM | POA: Diagnosis not present

## 2018-01-16 DIAGNOSIS — D649 Anemia, unspecified: Secondary | ICD-10-CM | POA: Diagnosis not present

## 2018-01-16 DIAGNOSIS — E119 Type 2 diabetes mellitus without complications: Secondary | ICD-10-CM | POA: Diagnosis not present

## 2018-01-16 DIAGNOSIS — L03116 Cellulitis of left lower limb: Secondary | ICD-10-CM | POA: Diagnosis not present

## 2018-01-16 DIAGNOSIS — Z86711 Personal history of pulmonary embolism: Secondary | ICD-10-CM | POA: Diagnosis not present

## 2018-01-16 DIAGNOSIS — Z86718 Personal history of other venous thrombosis and embolism: Secondary | ICD-10-CM | POA: Diagnosis not present

## 2018-01-16 DIAGNOSIS — Z7951 Long term (current) use of inhaled steroids: Secondary | ICD-10-CM | POA: Diagnosis not present

## 2018-01-16 DIAGNOSIS — M1611 Unilateral primary osteoarthritis, right hip: Secondary | ICD-10-CM | POA: Diagnosis not present

## 2018-01-16 DIAGNOSIS — I10 Essential (primary) hypertension: Secondary | ICD-10-CM | POA: Diagnosis not present

## 2018-01-16 DIAGNOSIS — Z96642 Presence of left artificial hip joint: Secondary | ICD-10-CM | POA: Diagnosis not present

## 2018-01-16 DIAGNOSIS — M25751 Osteophyte, right hip: Secondary | ICD-10-CM | POA: Diagnosis not present

## 2018-01-17 DIAGNOSIS — M1611 Unilateral primary osteoarthritis, right hip: Secondary | ICD-10-CM | POA: Diagnosis not present

## 2018-01-17 DIAGNOSIS — E785 Hyperlipidemia, unspecified: Secondary | ICD-10-CM | POA: Diagnosis not present

## 2018-01-17 DIAGNOSIS — M25751 Osteophyte, right hip: Secondary | ICD-10-CM | POA: Diagnosis not present

## 2018-01-17 DIAGNOSIS — Z86718 Personal history of other venous thrombosis and embolism: Secondary | ICD-10-CM | POA: Diagnosis not present

## 2018-01-17 DIAGNOSIS — Z86711 Personal history of pulmonary embolism: Secondary | ICD-10-CM | POA: Diagnosis not present

## 2018-01-17 DIAGNOSIS — L03116 Cellulitis of left lower limb: Secondary | ICD-10-CM | POA: Diagnosis not present

## 2018-01-17 DIAGNOSIS — E119 Type 2 diabetes mellitus without complications: Secondary | ICD-10-CM | POA: Diagnosis not present

## 2018-01-17 DIAGNOSIS — Z96642 Presence of left artificial hip joint: Secondary | ICD-10-CM | POA: Diagnosis not present

## 2018-01-17 DIAGNOSIS — Z7951 Long term (current) use of inhaled steroids: Secondary | ICD-10-CM | POA: Diagnosis not present

## 2018-01-17 DIAGNOSIS — K219 Gastro-esophageal reflux disease without esophagitis: Secondary | ICD-10-CM | POA: Diagnosis not present

## 2018-01-17 DIAGNOSIS — I10 Essential (primary) hypertension: Secondary | ICD-10-CM | POA: Diagnosis not present

## 2018-01-17 DIAGNOSIS — J45909 Unspecified asthma, uncomplicated: Secondary | ICD-10-CM | POA: Diagnosis not present

## 2018-01-17 DIAGNOSIS — D649 Anemia, unspecified: Secondary | ICD-10-CM | POA: Diagnosis not present

## 2018-01-17 DIAGNOSIS — Z7984 Long term (current) use of oral hypoglycemic drugs: Secondary | ICD-10-CM | POA: Diagnosis not present

## 2018-01-18 ENCOUNTER — Other Ambulatory Visit: Payer: Self-pay | Admitting: Family Medicine

## 2018-01-18 ENCOUNTER — Ambulatory Visit (HOSPITAL_BASED_OUTPATIENT_CLINIC_OR_DEPARTMENT_OTHER)
Admission: RE | Admit: 2018-01-18 | Discharge: 2018-01-18 | Disposition: A | Discharge status: Home / Self Care | Payer: Medicare Other | Source: Ambulatory Visit | Attending: Family Medicine | Admitting: Family Medicine

## 2018-01-18 DIAGNOSIS — Z7983 Long term (current) use of bisphosphonates: Secondary | ICD-10-CM | POA: Insufficient documentation

## 2018-01-18 DIAGNOSIS — Z1231 Encounter for screening mammogram for malignant neoplasm of breast: Secondary | ICD-10-CM | POA: Insufficient documentation

## 2018-01-18 DIAGNOSIS — E119 Type 2 diabetes mellitus without complications: Secondary | ICD-10-CM | POA: Diagnosis not present

## 2018-01-18 DIAGNOSIS — Z780 Asymptomatic menopausal state: Secondary | ICD-10-CM | POA: Diagnosis not present

## 2018-01-18 DIAGNOSIS — M8580 Other specified disorders of bone density and structure, unspecified site: Secondary | ICD-10-CM | POA: Diagnosis present

## 2018-01-18 DIAGNOSIS — Z13820 Encounter for screening for osteoporosis: Secondary | ICD-10-CM | POA: Insufficient documentation

## 2018-01-18 DIAGNOSIS — M85851 Other specified disorders of bone density and structure, right thigh: Secondary | ICD-10-CM | POA: Diagnosis not present

## 2018-01-18 DIAGNOSIS — Z79890 Hormone replacement therapy (postmenopausal): Secondary | ICD-10-CM | POA: Diagnosis not present

## 2018-01-18 DIAGNOSIS — Z7984 Long term (current) use of oral hypoglycemic drugs: Secondary | ICD-10-CM | POA: Diagnosis not present

## 2018-01-18 DIAGNOSIS — M85832 Other specified disorders of bone density and structure, left forearm: Secondary | ICD-10-CM | POA: Diagnosis not present

## 2018-01-19 ENCOUNTER — Other Ambulatory Visit: Payer: Self-pay | Admitting: Family Medicine

## 2018-01-19 DIAGNOSIS — N3281 Overactive bladder: Secondary | ICD-10-CM

## 2018-01-19 DIAGNOSIS — L03116 Cellulitis of left lower limb: Secondary | ICD-10-CM | POA: Diagnosis not present

## 2018-01-19 DIAGNOSIS — Z86718 Personal history of other venous thrombosis and embolism: Secondary | ICD-10-CM | POA: Diagnosis not present

## 2018-01-19 DIAGNOSIS — I10 Essential (primary) hypertension: Secondary | ICD-10-CM | POA: Diagnosis not present

## 2018-01-19 DIAGNOSIS — Z7951 Long term (current) use of inhaled steroids: Secondary | ICD-10-CM | POA: Diagnosis not present

## 2018-01-19 DIAGNOSIS — Z96642 Presence of left artificial hip joint: Secondary | ICD-10-CM | POA: Diagnosis not present

## 2018-01-19 DIAGNOSIS — E119 Type 2 diabetes mellitus without complications: Secondary | ICD-10-CM | POA: Diagnosis not present

## 2018-01-19 DIAGNOSIS — D649 Anemia, unspecified: Secondary | ICD-10-CM | POA: Diagnosis not present

## 2018-01-19 DIAGNOSIS — E785 Hyperlipidemia, unspecified: Secondary | ICD-10-CM | POA: Diagnosis not present

## 2018-01-19 DIAGNOSIS — J45909 Unspecified asthma, uncomplicated: Secondary | ICD-10-CM | POA: Diagnosis not present

## 2018-01-19 DIAGNOSIS — Z86711 Personal history of pulmonary embolism: Secondary | ICD-10-CM | POA: Diagnosis not present

## 2018-01-19 DIAGNOSIS — K219 Gastro-esophageal reflux disease without esophagitis: Secondary | ICD-10-CM | POA: Diagnosis not present

## 2018-01-19 DIAGNOSIS — Z7984 Long term (current) use of oral hypoglycemic drugs: Secondary | ICD-10-CM | POA: Diagnosis not present

## 2018-01-19 DIAGNOSIS — M25751 Osteophyte, right hip: Secondary | ICD-10-CM | POA: Diagnosis not present

## 2018-01-19 DIAGNOSIS — M1611 Unilateral primary osteoarthritis, right hip: Secondary | ICD-10-CM | POA: Diagnosis not present

## 2018-01-22 DIAGNOSIS — M25751 Osteophyte, right hip: Secondary | ICD-10-CM | POA: Diagnosis not present

## 2018-01-22 DIAGNOSIS — Z7984 Long term (current) use of oral hypoglycemic drugs: Secondary | ICD-10-CM | POA: Diagnosis not present

## 2018-01-22 DIAGNOSIS — E785 Hyperlipidemia, unspecified: Secondary | ICD-10-CM | POA: Diagnosis not present

## 2018-01-22 DIAGNOSIS — K219 Gastro-esophageal reflux disease without esophagitis: Secondary | ICD-10-CM | POA: Diagnosis not present

## 2018-01-22 DIAGNOSIS — J45909 Unspecified asthma, uncomplicated: Secondary | ICD-10-CM | POA: Diagnosis not present

## 2018-01-22 DIAGNOSIS — Z96642 Presence of left artificial hip joint: Secondary | ICD-10-CM | POA: Diagnosis not present

## 2018-01-22 DIAGNOSIS — M1611 Unilateral primary osteoarthritis, right hip: Secondary | ICD-10-CM | POA: Diagnosis not present

## 2018-01-22 DIAGNOSIS — Z86711 Personal history of pulmonary embolism: Secondary | ICD-10-CM | POA: Diagnosis not present

## 2018-01-22 DIAGNOSIS — D649 Anemia, unspecified: Secondary | ICD-10-CM | POA: Diagnosis not present

## 2018-01-22 DIAGNOSIS — Z7951 Long term (current) use of inhaled steroids: Secondary | ICD-10-CM | POA: Diagnosis not present

## 2018-01-22 DIAGNOSIS — I10 Essential (primary) hypertension: Secondary | ICD-10-CM | POA: Diagnosis not present

## 2018-01-22 DIAGNOSIS — E119 Type 2 diabetes mellitus without complications: Secondary | ICD-10-CM | POA: Diagnosis not present

## 2018-01-22 DIAGNOSIS — L03116 Cellulitis of left lower limb: Secondary | ICD-10-CM | POA: Diagnosis not present

## 2018-01-22 DIAGNOSIS — Z86718 Personal history of other venous thrombosis and embolism: Secondary | ICD-10-CM | POA: Diagnosis not present

## 2018-01-23 DIAGNOSIS — Z86718 Personal history of other venous thrombosis and embolism: Secondary | ICD-10-CM | POA: Diagnosis not present

## 2018-01-23 DIAGNOSIS — E785 Hyperlipidemia, unspecified: Secondary | ICD-10-CM | POA: Diagnosis not present

## 2018-01-23 DIAGNOSIS — L03116 Cellulitis of left lower limb: Secondary | ICD-10-CM | POA: Diagnosis not present

## 2018-01-23 DIAGNOSIS — D649 Anemia, unspecified: Secondary | ICD-10-CM | POA: Diagnosis not present

## 2018-01-23 DIAGNOSIS — M25751 Osteophyte, right hip: Secondary | ICD-10-CM | POA: Diagnosis not present

## 2018-01-23 DIAGNOSIS — E119 Type 2 diabetes mellitus without complications: Secondary | ICD-10-CM | POA: Diagnosis not present

## 2018-01-23 DIAGNOSIS — J45909 Unspecified asthma, uncomplicated: Secondary | ICD-10-CM | POA: Diagnosis not present

## 2018-01-23 DIAGNOSIS — M1611 Unilateral primary osteoarthritis, right hip: Secondary | ICD-10-CM | POA: Diagnosis not present

## 2018-01-23 DIAGNOSIS — Z7984 Long term (current) use of oral hypoglycemic drugs: Secondary | ICD-10-CM | POA: Diagnosis not present

## 2018-01-23 DIAGNOSIS — K219 Gastro-esophageal reflux disease without esophagitis: Secondary | ICD-10-CM | POA: Diagnosis not present

## 2018-01-23 DIAGNOSIS — I10 Essential (primary) hypertension: Secondary | ICD-10-CM | POA: Diagnosis not present

## 2018-01-23 DIAGNOSIS — Z96642 Presence of left artificial hip joint: Secondary | ICD-10-CM | POA: Diagnosis not present

## 2018-01-23 DIAGNOSIS — Z7951 Long term (current) use of inhaled steroids: Secondary | ICD-10-CM | POA: Diagnosis not present

## 2018-01-23 DIAGNOSIS — Z86711 Personal history of pulmonary embolism: Secondary | ICD-10-CM | POA: Diagnosis not present

## 2018-01-25 ENCOUNTER — Ambulatory Visit (INDEPENDENT_AMBULATORY_CARE_PROVIDER_SITE_OTHER): Payer: Medicare Other | Admitting: Orthopaedic Surgery

## 2018-01-25 ENCOUNTER — Encounter (INDEPENDENT_AMBULATORY_CARE_PROVIDER_SITE_OTHER): Payer: Self-pay | Admitting: Orthopaedic Surgery

## 2018-01-25 DIAGNOSIS — M1611 Unilateral primary osteoarthritis, right hip: Secondary | ICD-10-CM | POA: Diagnosis not present

## 2018-01-25 DIAGNOSIS — L03116 Cellulitis of left lower limb: Secondary | ICD-10-CM | POA: Diagnosis not present

## 2018-01-25 NOTE — Progress Notes (Signed)
The patient is well-known to me.  We performed a left total hip arthroplasty on her sometime ago.  This is at least 6 years ago.  Her right hip is developed severe bone-on-bone arthritis.  Unfortunately she is had significant cellulitis in her legs and we want to get this resolved before proceeding with a right total hip arthroplasty.  She has been on Keflex 4 times a day and wearing compressive garments and she ablates with a rolling walker.  She is doing great overall and feels minimal pain from the cellulitis but significant pain from her right hip.  At this point she and I feel comfortable with proceeding with her total hip arthroplasty on the right side.  On examination she has complete loss of any motion almost on her right hip due to severe pain he can feel the grinding in the hip joint itself and x-rays from earlier this year confirmed the severe end-stage bone-on-bone arthritis of the right hip.  Left hip moves normally.  The cellulitis in her legs are resolved.  At this point she will continue current compressive garments and finish her course of Keflex.  We will work on setting up for a right total hip arthroplasty.  All questions concerns were answered and addressed.  Having had this done before she is fully aware of what her intraoperative and postoperative course involved as well as the risk and benefits.

## 2018-01-26 DIAGNOSIS — Z86718 Personal history of other venous thrombosis and embolism: Secondary | ICD-10-CM | POA: Diagnosis not present

## 2018-01-26 DIAGNOSIS — I10 Essential (primary) hypertension: Secondary | ICD-10-CM | POA: Diagnosis not present

## 2018-01-26 DIAGNOSIS — Z96642 Presence of left artificial hip joint: Secondary | ICD-10-CM | POA: Diagnosis not present

## 2018-01-26 DIAGNOSIS — E119 Type 2 diabetes mellitus without complications: Secondary | ICD-10-CM | POA: Diagnosis not present

## 2018-01-26 DIAGNOSIS — D649 Anemia, unspecified: Secondary | ICD-10-CM | POA: Diagnosis not present

## 2018-01-26 DIAGNOSIS — Z7951 Long term (current) use of inhaled steroids: Secondary | ICD-10-CM | POA: Diagnosis not present

## 2018-01-26 DIAGNOSIS — Z7984 Long term (current) use of oral hypoglycemic drugs: Secondary | ICD-10-CM | POA: Diagnosis not present

## 2018-01-26 DIAGNOSIS — L03116 Cellulitis of left lower limb: Secondary | ICD-10-CM | POA: Diagnosis not present

## 2018-01-26 DIAGNOSIS — M25751 Osteophyte, right hip: Secondary | ICD-10-CM | POA: Diagnosis not present

## 2018-01-26 DIAGNOSIS — M1611 Unilateral primary osteoarthritis, right hip: Secondary | ICD-10-CM | POA: Diagnosis not present

## 2018-01-26 DIAGNOSIS — K219 Gastro-esophageal reflux disease without esophagitis: Secondary | ICD-10-CM | POA: Diagnosis not present

## 2018-01-26 DIAGNOSIS — E785 Hyperlipidemia, unspecified: Secondary | ICD-10-CM | POA: Diagnosis not present

## 2018-01-26 DIAGNOSIS — J45909 Unspecified asthma, uncomplicated: Secondary | ICD-10-CM | POA: Diagnosis not present

## 2018-01-26 DIAGNOSIS — Z86711 Personal history of pulmonary embolism: Secondary | ICD-10-CM | POA: Diagnosis not present

## 2018-02-05 NOTE — Progress Notes (Signed)
Please place orders in Epic as patient is being scheduled for a pre-op appointment! Thank you! 

## 2018-02-06 DIAGNOSIS — M25751 Osteophyte, right hip: Secondary | ICD-10-CM | POA: Diagnosis not present

## 2018-02-06 DIAGNOSIS — E785 Hyperlipidemia, unspecified: Secondary | ICD-10-CM | POA: Diagnosis not present

## 2018-02-06 DIAGNOSIS — Z7984 Long term (current) use of oral hypoglycemic drugs: Secondary | ICD-10-CM | POA: Diagnosis not present

## 2018-02-06 DIAGNOSIS — Z86718 Personal history of other venous thrombosis and embolism: Secondary | ICD-10-CM | POA: Diagnosis not present

## 2018-02-06 DIAGNOSIS — L03116 Cellulitis of left lower limb: Secondary | ICD-10-CM | POA: Diagnosis not present

## 2018-02-06 DIAGNOSIS — M1611 Unilateral primary osteoarthritis, right hip: Secondary | ICD-10-CM | POA: Diagnosis not present

## 2018-02-06 DIAGNOSIS — Z7951 Long term (current) use of inhaled steroids: Secondary | ICD-10-CM | POA: Diagnosis not present

## 2018-02-06 DIAGNOSIS — J45909 Unspecified asthma, uncomplicated: Secondary | ICD-10-CM | POA: Diagnosis not present

## 2018-02-06 DIAGNOSIS — Z86711 Personal history of pulmonary embolism: Secondary | ICD-10-CM | POA: Diagnosis not present

## 2018-02-06 DIAGNOSIS — E119 Type 2 diabetes mellitus without complications: Secondary | ICD-10-CM | POA: Diagnosis not present

## 2018-02-06 DIAGNOSIS — D649 Anemia, unspecified: Secondary | ICD-10-CM | POA: Diagnosis not present

## 2018-02-06 DIAGNOSIS — Z96642 Presence of left artificial hip joint: Secondary | ICD-10-CM | POA: Diagnosis not present

## 2018-02-06 DIAGNOSIS — K219 Gastro-esophageal reflux disease without esophagitis: Secondary | ICD-10-CM | POA: Diagnosis not present

## 2018-02-06 DIAGNOSIS — I10 Essential (primary) hypertension: Secondary | ICD-10-CM | POA: Diagnosis not present

## 2018-02-09 ENCOUNTER — Other Ambulatory Visit (INDEPENDENT_AMBULATORY_CARE_PROVIDER_SITE_OTHER): Payer: Self-pay

## 2018-02-09 ENCOUNTER — Other Ambulatory Visit (INDEPENDENT_AMBULATORY_CARE_PROVIDER_SITE_OTHER): Payer: Self-pay | Admitting: Physician Assistant

## 2018-02-09 NOTE — Progress Notes (Signed)
12-25-17 (Epic) LLE doppler (negative)

## 2018-02-09 NOTE — Patient Instructions (Addendum)
MICHELINA MEXICANO  02/09/2018   Your procedure is scheduled on: 02-23-18     Report to [facility] Main  Entrance    Report to Admitting at 8:15 AM    Call this number if you have problems the morning of surgery [phone]    Remember: Do not eat food or drink liquids :After Midnight. BRUSH YOUR TEETH MORNING OF SURGERY AND RINSE YOUR MOUTH OUT, NO CHEWING GUM CANDY OR MINTS.     Take these medicines the morning of surgery with A SIP OF WATER: Ezetimibe (Zetia). You may also bring and use your inhaler and nasal spray.                                You may not have any metal on your body including hair pins and              piercings  Do not wear jewelry, make-up, lotions, powders or perfumes, deodorant             Do not wear nail polish.  Do not shave  48 hours prior to surgery.              Do not bring valuables to the hospital. [place].  Contacts, dentures or bridgework may not be worn into surgery.  Leave suitcase in the car. After surgery it may be brought to your room.  :  Special Instructions: N/A              Please read over the following fact sheets you were given: _____________________________________________________________________             How to Manage Your Diabetes Before and After Surgery  Why is it important to control my blood sugar before and after surgery? . Improving blood sugar levels before and after surgery helps healing and can limit problems. . A way of improving blood sugar control is eating a healthy diet by: o  Eating less sugar and carbohydrates o  Increasing activity/exercise o  Talking with your doctor about reaching your blood sugar goals . High blood sugars (greater than 180 mg/dL) can raise your risk of infections and slow your recovery, so you will need to focus on controlling your diabetes during the weeks before surgery. . Make sure that the doctor who takes  care of your diabetes knows about your planned surgery including the date and location.  How do I manage my blood sugar before surgery? . Check your blood sugar at least 4 times a day, starting 2 days before surgery, to make sure that the level is not too high or low. o Check your blood sugar the morning of your surgery when you wake up and every 2 hours until you get to the Short Stay unit. . If your blood sugar is less than 70 mg/dL, you will need to treat for low blood sugar: o Do not take insulin. o Treat a low blood sugar (less than 70 mg/dL) with  cup of clear juice (cranberry or apple), 4 glucose tablets, OR glucose gel. o Recheck blood sugar in 15 minutes after treatment (to make sure it is greater than 70 mg/dL). If your blood sugar is not greater than 70 mg/dL on  recheck, call [phone] for further instructions. . Report your blood sugar to the short stay nurse when you get to Short Stay.  . If you are admitted to the hospital after surgery: o Your blood sugar will be checked by the staff and you will probably be given insulin after surgery (instead of oral diabetes medicines) to make sure you have good blood sugar levels. o The goal for blood sugar control after surgery is 80-180 mg/dL.   WHAT DO I DO ABOUT MY DIABETES MEDICATION?  [name] Do not take oral diabetes medicines (pills) the morning of surgery.  . THE DAY BEFORE SURGERY, take your usual dose of Metformin.        [place] - Preparing for Surgery Before surgery, you can play an important role.  Because skin is not sterile, your skin needs to be as free of germs as possible.  You can reduce the number of germs on your skin by washing with CHG (chlorahexidine gluconate) soap before surgery.  CHG is an antiseptic cleaner which kills germs and bonds with the skin to continue killing germs even after washing. Please DO NOT use if you have an allergy to CHG or antibacterial soaps.  If your skin becomes reddened/irritated  stop using the CHG and inform your nurse when you arrive at Short Stay. Do not shave (including legs and underarms) for at least 48 hours prior to the first CHG shower.  You may shave your face/neck. Please follow these instructions carefully:  1.  Shower with CHG Soap the night before surgery and the  morning of Surgery.  2.  If you choose to wash your hair, wash your hair first as usual with your  normal  shampoo.  3.  After you shampoo, rinse your hair and body thoroughly to remove the  shampoo.                           4.  Use CHG as you would any other liquid soap.  You can apply chg directly  to the skin and wash                       Gently with a scrungie or clean washcloth.  5.  Apply the CHG Soap to your body ONLY FROM THE NECK DOWN.   Do not use on face/ open                           Wound or open sores. Avoid contact with eyes, ears mouth and genitals (private parts).                       Wash face,  Genitals (private parts) with your normal soap.             6.  Wash thoroughly, paying special attention to the area where your surgery  will be performed.  7.  Thoroughly rinse your body with warm water from the neck down.  8.  DO NOT shower/wash with your normal soap after using and rinsing off  the CHG Soap.                9.  Pat yourself dry with a clean towel.            10.  Wear clean pajamas.            11.  Place clean sheets on your bed the night of your first shower and do not  sleep with pets. Day of Surgery : Do not apply any lotions/deodorants the morning of surgery.  Please wear clean clothes to the hospital/surgery center.  FAILURE TO FOLLOW THESE INSTRUCTIONS MAY RESULT IN THE CANCELLATION OF YOUR SURGERY PATIENT SIGNATURE_________________________________  NURSE SIGNATURE__________________________________  ________________________________________________________________________   Adam Phenix  An incentive spirometer is a tool that can help keep your  lungs clear and active. This tool measures how well you are filling your lungs with each breath. Taking long deep breaths may help reverse or decrease the chance of developing breathing (pulmonary) problems (especially infection) following:  A long period of time when you are unable to move or be active. BEFORE THE PROCEDURE   If the spirometer includes an indicator to show your best effort, your nurse or respiratory therapist will set it to a desired goal.  If possible, sit up straight or lean slightly forward. Try not to slouch.  Hold the incentive spirometer in an upright position. INSTRUCTIONS FOR USE  1. Sit on the edge of your bed if possible, or sit up as far as you can in bed or on a chair. 2. Hold the incentive spirometer in an upright position. 3. Breathe out normally. 4. Place the mouthpiece in your mouth and seal your lips tightly around it. 5. Breathe in slowly and as deeply as possible, raising the piston or the ball toward the top of the column. 6. Hold your breath for 3-5 seconds or for as long as possible. Allow the piston or ball to fall to the bottom of the column. 7. Remove the mouthpiece from your mouth and breathe out normally. 8. Rest for a few seconds and repeat Steps 1 through 7 at least 10 times every 1-2 hours when you are awake. Take your time and take a few normal breaths between deep breaths. 9. The spirometer may include an indicator to show your best effort. Use the indicator as a goal to work toward during each repetition. 10. After each set of 10 deep breaths, practice coughing to be sure your lungs are clear. If you have an incision (the cut made at the time of surgery), support your incision when coughing by placing a pillow or rolled up towels firmly against it. Once you are able to get out of bed, walk around indoors and cough well. You may stop using the incentive spirometer when instructed by your caregiver.  RISKS AND COMPLICATIONS  Take your time so  you do not get dizzy or light-headed.  If you are in pain, you may need to take or ask for pain medication before doing incentive spirometry. It is harder to take a deep breath if you are having pain. AFTER USE  Rest and breathe slowly and easily.  It can be helpful to keep track of a log of your progress. Your caregiver can provide you with a simple table to help with this. If you are using the spirometer at home, follow these instructions: [place] IF:   You are having difficultly using the spirometer.  You have trouble using the spirometer as often as instructed.  Your pain medication is not giving enough relief while using the spirometer.  You develop fever of 100.5 F (38.1 C) or higher. SEEK IMMEDIATE MEDICAL CARE IF:   You cough up bloody sputum that had not been present before.  You develop fever of 102 F (38.9 C) or greater.  You develop worsening pain at or near the incision site. MAKE SURE YOU:   Understand these instructions.  Will watch your condition.  Will get help right away if you are not doing well or get worse. Document Released: 09/26/2006 Document Revised: 08/08/2011 Document Reviewed: 11/27/2006 [facility] Patient Information 2014 [place], [place].   ________________________________________________________________________

## 2018-02-12 ENCOUNTER — Encounter (HOSPITAL_COMMUNITY): Payer: Self-pay

## 2018-02-12 ENCOUNTER — Encounter (HOSPITAL_COMMUNITY)
Admission: RE | Admit: 2018-02-12 | Discharge: 2018-02-12 | Disposition: A | Discharge status: Home / Self Care | Payer: Medicare Other | Source: Ambulatory Visit | Attending: Orthopaedic Surgery | Admitting: Orthopaedic Surgery

## 2018-02-12 ENCOUNTER — Other Ambulatory Visit: Payer: Self-pay

## 2018-02-12 DIAGNOSIS — M1611 Unilateral primary osteoarthritis, right hip: Secondary | ICD-10-CM | POA: Diagnosis not present

## 2018-02-12 DIAGNOSIS — Z01818 Encounter for other preprocedural examination: Secondary | ICD-10-CM | POA: Diagnosis not present

## 2018-02-12 DIAGNOSIS — I10 Essential (primary) hypertension: Secondary | ICD-10-CM | POA: Insufficient documentation

## 2018-02-12 LAB — SURGICAL PCR SCREEN
MRSA, PCR: NEGATIVE
STAPHYLOCOCCUS AUREUS: NEGATIVE

## 2018-02-12 LAB — CBC
HCT: 36.8 % (ref 36.0–46.0)
Hemoglobin: 12.1 g/dL (ref 12.0–15.0)
MCH: 29.2 pg (ref 26.0–34.0)
MCHC: 32.9 g/dL (ref 30.0–36.0)
MCV: 88.7 fL (ref 78.0–100.0)
PLATELETS: 267 10*3/uL (ref 150–400)
RBC: 4.15 MIL/uL (ref 3.87–5.11)
RDW: 15 % (ref 11.5–15.5)
WBC: 7.8 10*3/uL (ref 4.0–10.5)

## 2018-02-12 LAB — BASIC METABOLIC PANEL
Anion gap: 8 (ref 5–15)
BUN: 18 mg/dL (ref 8–23)
CO2: 28 mmol/L (ref 22–32)
CREATININE: 0.84 mg/dL (ref 0.44–1.00)
Calcium: 9.5 mg/dL (ref 8.9–10.3)
Chloride: 106 mmol/L (ref 98–111)
GFR calc Af Amer: >60 mL/min (ref >60)
GLUCOSE: 93 mg/dL (ref 70–99)
Potassium: 4.7 mmol/L (ref 3.5–5.1)
SODIUM: 142 mmol/L (ref 135–145)

## 2018-02-12 LAB — GLUCOSE, CAPILLARY: GLUCOSE-CAPILLARY: 92 mg/dL (ref 70–99)

## 2018-02-13 LAB — HEMOGLOBIN A1C
HEMOGLOBIN A1C: 6.1 % — AB (ref 4.8–5.6)
Mean Plasma Glucose: 128 mg/dL

## 2018-02-16 ENCOUNTER — Telehealth: Payer: Self-pay | Admitting: Family Medicine

## 2018-02-16 NOTE — Telephone Encounter (Signed)
Spoke with [facility] and advised her that most recent labs / EKG were ordered by Ortho and she should contact them for most recent info. She asks that I fax OV and Med list from July. Information faxed to below #.

## 2018-02-16 NOTE — Telephone Encounter (Signed)
Copied from [place] [id number]. Topic: General - Other >> Feb 16, 2018 12:07 PM Yvette Rack wrote: Reason for CRM: Nurse McCook from [facility] [phone] calling stating that pt is having surgery next week on the 27th shes asking for labs cpe OV notes medication list and history on pt please fax at [phone]

## 2018-02-23 ENCOUNTER — Encounter (HOSPITAL_COMMUNITY)
Admission: RE | Disposition: A | Discharge status: Skilled Nursing Facility | Payer: Self-pay | Source: Other Acute Inpatient Hospital | Attending: Orthopaedic Surgery

## 2018-02-23 ENCOUNTER — Inpatient Hospital Stay (HOSPITAL_COMMUNITY)
Admission: RE | Admit: 2018-02-23 | Discharge: 2018-02-26 | DRG: 470 | Disposition: A | Discharge status: Skilled Nursing Facility | Payer: Medicare Other | Source: Other Acute Inpatient Hospital | Attending: Orthopaedic Surgery | Admitting: Orthopaedic Surgery

## 2018-02-23 ENCOUNTER — Other Ambulatory Visit: Payer: Self-pay

## 2018-02-23 ENCOUNTER — Inpatient Hospital Stay (HOSPITAL_COMMUNITY): Payer: Medicare Other | Admitting: Certified Registered Nurse Anesthetist

## 2018-02-23 ENCOUNTER — Inpatient Hospital Stay (HOSPITAL_COMMUNITY): Payer: Medicare Other

## 2018-02-23 ENCOUNTER — Encounter (HOSPITAL_COMMUNITY): Payer: Self-pay | Admitting: Certified Registered Nurse Anesthetist

## 2018-02-23 DIAGNOSIS — Z85828 Personal history of other malignant neoplasm of skin: Secondary | ICD-10-CM | POA: Diagnosis not present

## 2018-02-23 DIAGNOSIS — Z96642 Presence of left artificial hip joint: Secondary | ICD-10-CM | POA: Diagnosis not present

## 2018-02-23 DIAGNOSIS — I10 Essential (primary) hypertension: Secondary | ICD-10-CM | POA: Diagnosis present

## 2018-02-23 DIAGNOSIS — Z96641 Presence of right artificial hip joint: Secondary | ICD-10-CM

## 2018-02-23 DIAGNOSIS — E1151 Type 2 diabetes mellitus with diabetic peripheral angiopathy without gangrene: Secondary | ICD-10-CM | POA: Diagnosis present

## 2018-02-23 DIAGNOSIS — M1611 Unilateral primary osteoarthritis, right hip: Principal | ICD-10-CM | POA: Diagnosis present

## 2018-02-23 DIAGNOSIS — K219 Gastro-esophageal reflux disease without esophagitis: Secondary | ICD-10-CM | POA: Diagnosis not present

## 2018-02-23 DIAGNOSIS — E1140 Type 2 diabetes mellitus with diabetic neuropathy, unspecified: Secondary | ICD-10-CM | POA: Diagnosis not present

## 2018-02-23 DIAGNOSIS — Z86711 Personal history of pulmonary embolism: Secondary | ICD-10-CM

## 2018-02-23 DIAGNOSIS — Z7984 Long term (current) use of oral hypoglycemic drugs: Secondary | ICD-10-CM

## 2018-02-23 DIAGNOSIS — Z9181 History of falling: Secondary | ICD-10-CM

## 2018-02-23 DIAGNOSIS — Z79899 Other long term (current) drug therapy: Secondary | ICD-10-CM

## 2018-02-23 DIAGNOSIS — E119 Type 2 diabetes mellitus without complications: Secondary | ICD-10-CM | POA: Diagnosis not present

## 2018-02-23 DIAGNOSIS — M1990 Unspecified osteoarthritis, unspecified site: Secondary | ICD-10-CM

## 2018-02-23 DIAGNOSIS — E785 Hyperlipidemia, unspecified: Secondary | ICD-10-CM | POA: Diagnosis present

## 2018-02-23 DIAGNOSIS — Z471 Aftercare following joint replacement surgery: Secondary | ICD-10-CM | POA: Diagnosis not present

## 2018-02-23 DIAGNOSIS — Z86718 Personal history of other venous thrombosis and embolism: Secondary | ICD-10-CM

## 2018-02-23 DIAGNOSIS — Z833 Family history of diabetes mellitus: Secondary | ICD-10-CM

## 2018-02-23 DIAGNOSIS — M659 Synovitis and tenosynovitis, unspecified: Secondary | ICD-10-CM | POA: Diagnosis present

## 2018-02-23 DIAGNOSIS — Z7951 Long term (current) use of inhaled steroids: Secondary | ICD-10-CM

## 2018-02-23 HISTORY — PX: TOTAL HIP ARTHROPLASTY: SHX124

## 2018-02-23 LAB — GLUCOSE, CAPILLARY
GLUCOSE-CAPILLARY: 207 mg/dL — AB (ref 70–99)
Glucose-Capillary: 95 mg/dL (ref 70–99)
Glucose-Capillary: 137 mg/dL — ABNORMAL HIGH (ref 70–99)
Glucose-Capillary: 190 mg/dL — ABNORMAL HIGH (ref 70–99)

## 2018-02-23 SURGERY — ARTHROPLASTY, HIP, TOTAL, ANTERIOR APPROACH
Anesthesia: General | Site: Hip | Laterality: Right

## 2018-02-23 MED ORDER — METOCLOPRAMIDE HCL 5 MG/ML IJ SOLN: 5.0000 mg | Freq: Three times a day (TID) | INTRAMUSCULAR | Status: DC | PRN

## 2018-02-23 MED ORDER — PREGABALIN 100 MG PO CAPS
100.0000 mg | ORAL_CAPSULE | Freq: Three times a day (TID) | ORAL | Status: DC
  Administered 2018-02-23 – 2018-02-26 (×9): 100 mg via ORAL
  Filled 2018-02-23 (×9): qty 1

## 2018-02-23 MED ORDER — ONDANSETRON HCL 4 MG/2ML IJ SOLN: 4.0000 mg | Freq: Once | INTRAMUSCULAR | Status: DC | PRN

## 2018-02-23 MED ORDER — ONDANSETRON HCL 4 MG/2ML IJ SOLN
INTRAMUSCULAR | Status: AC
  Filled 2018-02-23: qty 2

## 2018-02-23 MED ORDER — ONDANSETRON HCL 4 MG/2ML IJ SOLN: 4.0000 mg | Freq: Four times a day (QID) | INTRAMUSCULAR | Status: DC | PRN

## 2018-02-23 MED ORDER — OXYCODONE HCL 5 MG PO TABS
10.0000 mg | ORAL_TABLET | ORAL | Status: DC | PRN
  Administered 2018-02-23: 15 mg via ORAL
  Administered 2018-02-24 – 2018-02-26 (×4): 10 mg via ORAL
  Filled 2018-02-23 (×2): qty 2
  Filled 2018-02-23: qty 3

## 2018-02-23 MED ORDER — PANTOPRAZOLE SODIUM 40 MG PO TBEC
40.0000 mg | DELAYED_RELEASE_TABLET | Freq: Every day | ORAL | Status: DC
  Administered 2018-02-24 – 2018-02-26 (×3): 40 mg via ORAL
  Filled 2018-02-23 (×4): qty 1

## 2018-02-23 MED ORDER — EPHEDRINE SULFATE-NACL 50-0.9 MG/10ML-% IV SOSY
PREFILLED_SYRINGE | INTRAVENOUS | Status: DC | PRN
  Administered 2018-02-23 (×4): 10 mg via INTRAVENOUS

## 2018-02-23 MED ORDER — PROPOFOL 10 MG/ML IV BOLUS
INTRAVENOUS | Status: DC | PRN
  Administered 2018-02-23: 120 mg via INTRAVENOUS

## 2018-02-23 MED ORDER — PHENOL 1.4 % MT LIQD: 1.0000 | OROMUCOSAL | Status: DC | PRN

## 2018-02-23 MED ORDER — SODIUM CHLORIDE 0.9 % IR SOLN
Status: DC | PRN
  Administered 2018-02-23: 1000 mL

## 2018-02-23 MED ORDER — SODIUM CHLORIDE 0.9 % IV SOLN
INTRAVENOUS | Status: DC
  Administered 2018-02-23 – 2018-02-24 (×3): via INTRAVENOUS

## 2018-02-23 MED ORDER — ASPIRIN 81 MG PO CHEW
81.0000 mg | CHEWABLE_TABLET | Freq: Two times a day (BID) | ORAL | Status: DC
  Administered 2018-02-23 – 2018-02-26 (×6): 81 mg via ORAL
  Filled 2018-02-23 (×6): qty 1

## 2018-02-23 MED ORDER — LIP MEDEX EX OINT
TOPICAL_OINTMENT | CUTANEOUS | Status: AC
  Filled 2018-02-23: qty 7

## 2018-02-23 MED ORDER — HYDROMORPHONE HCL 1 MG/ML IJ SOLN: 0.2500 mg | INTRAMUSCULAR | Status: DC | PRN

## 2018-02-23 MED ORDER — OXYCODONE HCL 5 MG PO TABS
5.0000 mg | ORAL_TABLET | ORAL | Status: DC | PRN
  Administered 2018-02-23: 5 mg via ORAL
  Administered 2018-02-24 – 2018-02-25 (×2): 10 mg via ORAL
  Filled 2018-02-23: qty 1
  Filled 2018-02-23 (×2): qty 2
  Filled 2018-02-23: qty 1
  Filled 2018-02-23 (×2): qty 2

## 2018-02-23 MED ORDER — SUGAMMADEX SODIUM 200 MG/2ML IV SOLN
INTRAVENOUS | Status: DC | PRN
  Administered 2018-02-23: 200 mg via INTRAVENOUS

## 2018-02-23 MED ORDER — METOCLOPRAMIDE HCL 5 MG PO TABS: 5.0000 mg | ORAL_TABLET | Freq: Three times a day (TID) | ORAL | Status: DC | PRN

## 2018-02-23 MED ORDER — CHLORHEXIDINE GLUCONATE 4 % EX LIQD: 60.0000 mL | Freq: Once | CUTANEOUS | Status: DC

## 2018-02-23 MED ORDER — HYDROMORPHONE HCL 1 MG/ML IJ SOLN
INTRAMUSCULAR | Status: AC
  Filled 2018-02-23: qty 1

## 2018-02-23 MED ORDER — LIDOCAINE 2% (20 MG/ML) 5 ML SYRINGE
INTRAMUSCULAR | Status: DC | PRN
  Administered 2018-02-23: 60 mg via INTRAVENOUS

## 2018-02-23 MED ORDER — POLYETHYLENE GLYCOL 3350 17 G PO PACK
17.0000 g | PACK | Freq: Every day | ORAL | Status: DC | PRN
  Administered 2018-02-26: 17 g via ORAL
  Filled 2018-02-23: qty 1

## 2018-02-23 MED ORDER — ROCURONIUM BROMIDE 10 MG/ML (PF) SYRINGE
PREFILLED_SYRINGE | INTRAVENOUS | Status: DC | PRN
  Administered 2018-02-23 (×2): 50 mg via INTRAVENOUS

## 2018-02-23 MED ORDER — MEPERIDINE HCL 50 MG/ML IJ SOLN: 6.2500 mg | INTRAMUSCULAR | Status: DC | PRN

## 2018-02-23 MED ORDER — PHENYLEPHRINE 40 MCG/ML (10ML) SYRINGE FOR IV PUSH (FOR BLOOD PRESSURE SUPPORT)
PREFILLED_SYRINGE | INTRAVENOUS | Status: DC | PRN
  Administered 2018-02-23: 40 ug via INTRAVENOUS
  Administered 2018-02-23: 80 ug via INTRAVENOUS

## 2018-02-23 MED ORDER — VITAMIN D 1000 UNITS PO TABS
1000.0000 [IU] | ORAL_TABLET | Freq: Every day | ORAL | Status: DC
  Administered 2018-02-23 – 2018-02-26 (×4): 1000 [IU] via ORAL
  Filled 2018-02-23 (×4): qty 1

## 2018-02-23 MED ORDER — PROPOFOL 10 MG/ML IV BOLUS
INTRAVENOUS | Status: AC
  Filled 2018-02-23: qty 20

## 2018-02-23 MED ORDER — LIDOCAINE 2% (20 MG/ML) 5 ML SYRINGE
INTRAMUSCULAR | Status: AC
  Filled 2018-02-23: qty 5

## 2018-02-23 MED ORDER — ADULT MULTIVITAMIN W/MINERALS CH
1.0000 | ORAL_TABLET | Freq: Every day | ORAL | Status: DC
  Administered 2018-02-23 – 2018-02-26 (×4): 1 via ORAL
  Filled 2018-02-23 (×4): qty 1

## 2018-02-23 MED ORDER — TRANEXAMIC ACID 1000 MG/10ML IV SOLN
1000.0000 mg | INTRAVENOUS | Status: AC
  Administered 2018-02-23: 1000 mg via INTRAVENOUS
  Filled 2018-02-23: qty 10

## 2018-02-23 MED ORDER — MENTHOL 3 MG MT LOZG: 1.0000 | LOZENGE | OROMUCOSAL | Status: DC | PRN

## 2018-02-23 MED ORDER — ROCURONIUM BROMIDE 10 MG/ML (PF) SYRINGE
PREFILLED_SYRINGE | INTRAVENOUS | Status: AC
  Filled 2018-02-23: qty 10

## 2018-02-23 MED ORDER — METFORMIN HCL ER 500 MG PO TB24
500.0000 mg | ORAL_TABLET | Freq: Every day | ORAL | Status: DC
  Administered 2018-02-23 – 2018-02-25 (×3): 500 mg via ORAL
  Filled 2018-02-23 (×3): qty 1

## 2018-02-23 MED ORDER — LACTATED RINGERS IV SOLN
INTRAVENOUS | Status: DC
  Administered 2018-02-23 (×2): via INTRAVENOUS

## 2018-02-23 MED ORDER — ALBUTEROL SULFATE (2.5 MG/3ML) 0.083% IN NEBU: 2.5000 mg | INHALATION_SOLUTION | Freq: Four times a day (QID) | RESPIRATORY_TRACT | Status: DC | PRN

## 2018-02-23 MED ORDER — MOMETASONE FURO-FORMOTEROL FUM 200-5 MCG/ACT IN AERO
2.0000 | INHALATION_SPRAY | Freq: Two times a day (BID) | RESPIRATORY_TRACT | Status: DC
  Administered 2018-02-23 – 2018-02-26 (×6): 2 via RESPIRATORY_TRACT
  Filled 2018-02-23: qty 8.8

## 2018-02-23 MED ORDER — METHOCARBAMOL 500 MG IVPB - SIMPLE MED
500.0000 mg | Freq: Four times a day (QID) | INTRAVENOUS | Status: DC | PRN
  Administered 2018-02-23: 500 mg via INTRAVENOUS
  Filled 2018-02-23: qty 50

## 2018-02-23 MED ORDER — ONDANSETRON HCL 4 MG/2ML IJ SOLN
INTRAMUSCULAR | Status: DC | PRN
  Administered 2018-02-23: 4 mg via INTRAVENOUS

## 2018-02-23 MED ORDER — METHOCARBAMOL 500 MG PO TABS
500.0000 mg | ORAL_TABLET | Freq: Four times a day (QID) | ORAL | Status: DC | PRN
  Administered 2018-02-23 – 2018-02-26 (×5): 500 mg via ORAL
  Filled 2018-02-23 (×5): qty 1

## 2018-02-23 MED ORDER — 0.9 % SODIUM CHLORIDE (POUR BTL) OPTIME
TOPICAL | Status: DC | PRN
  Administered 2018-02-23: 1000 mL

## 2018-02-23 MED ORDER — HYDROMORPHONE HCL 1 MG/ML IJ SOLN: 0.5000 mg | INTRAMUSCULAR | Status: DC | PRN

## 2018-02-23 MED ORDER — ONDANSETRON HCL 4 MG PO TABS: 4.0000 mg | ORAL_TABLET | Freq: Four times a day (QID) | ORAL | Status: DC | PRN

## 2018-02-23 MED ORDER — CEFAZOLIN SODIUM-DEXTROSE 1-4 GM/50ML-% IV SOLN
1.0000 g | Freq: Four times a day (QID) | INTRAVENOUS | Status: AC
  Administered 2018-02-23 (×2): 1 g via INTRAVENOUS
  Filled 2018-02-23 (×2): qty 50

## 2018-02-23 MED ORDER — DEXAMETHASONE SODIUM PHOSPHATE 10 MG/ML IJ SOLN
INTRAMUSCULAR | Status: AC
  Filled 2018-02-23: qty 1

## 2018-02-23 MED ORDER — FENTANYL CITRATE (PF) 250 MCG/5ML IJ SOLN
INTRAMUSCULAR | Status: AC
  Filled 2018-02-23: qty 5

## 2018-02-23 MED ORDER — STERILE WATER FOR IRRIGATION IR SOLN
Status: DC | PRN
  Administered 2018-02-23: 2000 mL

## 2018-02-23 MED ORDER — METHOCARBAMOL 500 MG IVPB - SIMPLE MED
INTRAVENOUS | Status: AC
  Filled 2018-02-23: qty 50

## 2018-02-23 MED ORDER — ALUM & MAG HYDROXIDE-SIMETH 200-200-20 MG/5ML PO SUSP: 30.0000 mL | ORAL | Status: DC | PRN

## 2018-02-23 MED ORDER — FENTANYL CITRATE (PF) 250 MCG/5ML IJ SOLN
INTRAMUSCULAR | Status: DC | PRN
  Administered 2018-02-23: 100 ug via INTRAVENOUS
  Administered 2018-02-23 (×3): 50 ug via INTRAVENOUS

## 2018-02-23 MED ORDER — ALBUTEROL SULFATE HFA 108 (90 BASE) MCG/ACT IN AERS: 2.0000 | INHALATION_SPRAY | Freq: Four times a day (QID) | RESPIRATORY_TRACT | Status: DC | PRN

## 2018-02-23 MED ORDER — CEFAZOLIN SODIUM-DEXTROSE 2-4 GM/100ML-% IV SOLN
2.0000 g | INTRAVENOUS | Status: AC
  Administered 2018-02-23: 2 g via INTRAVENOUS
  Filled 2018-02-23: qty 100

## 2018-02-23 MED ORDER — DIPHENHYDRAMINE HCL 12.5 MG/5ML PO ELIX: 12.5000 mg | ORAL_SOLUTION | ORAL | Status: DC | PRN

## 2018-02-23 MED ORDER — DEXAMETHASONE SODIUM PHOSPHATE 10 MG/ML IJ SOLN
INTRAMUSCULAR | Status: DC | PRN
  Administered 2018-02-23: 10 mg via INTRAVENOUS

## 2018-02-23 MED ORDER — ACETAMINOPHEN 325 MG PO TABS
325.0000 mg | ORAL_TABLET | Freq: Four times a day (QID) | ORAL | Status: DC | PRN
  Administered 2018-02-24: 650 mg via ORAL
  Filled 2018-02-23: qty 2

## 2018-02-23 MED ORDER — EZETIMIBE 10 MG PO TABS
10.0000 mg | ORAL_TABLET | Freq: Every day | ORAL | Status: DC
  Administered 2018-02-24 – 2018-02-26 (×3): 10 mg via ORAL
  Filled 2018-02-23 (×3): qty 1

## 2018-02-23 MED ORDER — DOCUSATE SODIUM 100 MG PO CAPS
100.0000 mg | ORAL_CAPSULE | Freq: Two times a day (BID) | ORAL | Status: DC
  Administered 2018-02-23 – 2018-02-26 (×6): 100 mg via ORAL
  Filled 2018-02-23 (×6): qty 1

## 2018-02-23 SURGICAL SUPPLY — 38 items
APL SKNCLS STERI-STRIP NONHPOA (GAUZE/BANDAGES/DRESSINGS)
BAG SPEC THK2 15X12 ZIP CLS (MISCELLANEOUS)
BAG ZIPLOCK 12X15 (MISCELLANEOUS) IMPLANT
BENZOIN TINCTURE PRP APPL 2/3 (GAUZE/BANDAGES/DRESSINGS) IMPLANT
BLADE SAW SGTL 18X1.27X75 (BLADE) ×2 IMPLANT
BLADE SAW SGTL 18X1.27X75MM (BLADE) ×1
CLOSURE WOUND 1/2 X4 (GAUZE/BANDAGES/DRESSINGS)
COVER PERINEAL POST (MISCELLANEOUS) ×3 IMPLANT
COVER SURGICAL LIGHT HANDLE (MISCELLANEOUS) ×3 IMPLANT
CUP SECTOR GRIPTON 50MM (Cup) ×2 IMPLANT
DRAPE STERI IOBAN 125X83 (DRAPES) ×3 IMPLANT
DRAPE U-SHAPE 47X51 STRL (DRAPES) ×6 IMPLANT
DRSG AQUACEL AG ADV 3.5X10 (GAUZE/BANDAGES/DRESSINGS) ×3 IMPLANT
DURAPREP 26ML APPLICATOR (WOUND CARE) ×3 IMPLANT
ELECT REM PT RETURN 15FT ADLT (MISCELLANEOUS) ×3 IMPLANT
GAUZE XEROFORM 1X8 LF (GAUZE/BANDAGES/DRESSINGS) ×2 IMPLANT
GLOVE BIO SURGEON STRL SZ7.5 (GLOVE) ×3 IMPLANT
GLOVE BIOGEL PI IND STRL 8 (GLOVE) ×2 IMPLANT
GLOVE BIOGEL PI INDICATOR 8 (GLOVE) ×4
GLOVE ECLIPSE 8.0 STRL XLNG CF (GLOVE) ×3 IMPLANT
GOWN STRL REUS W/TWL XL LVL3 (GOWN DISPOSABLE) ×6 IMPLANT
HANDPIECE INTERPULSE COAX TIP (DISPOSABLE) ×3
HEAD FEM STD 32X+1 STRL (Hips) ×2 IMPLANT
HOLDER FOLEY CATH W/STRAP (MISCELLANEOUS) ×3 IMPLANT
LINER ACETABULAR 32X50 (Liner) ×3 IMPLANT
PACK ANTERIOR HIP CUSTOM (KITS) ×3 IMPLANT
SET HNDPC FAN SPRY TIP SCT (DISPOSABLE) ×1 IMPLANT
STAPLER VISISTAT 35W (STAPLE) IMPLANT
STEM CORAIL KA11 (Stem) ×2 IMPLANT
STRIP CLOSURE SKIN 1/2X4 (GAUZE/BANDAGES/DRESSINGS) IMPLANT
SUT ETHIBOND NAB CT1 #1 30IN (SUTURE) ×3 IMPLANT
SUT MNCRL AB 4-0 PS2 18 (SUTURE) IMPLANT
SUT VIC AB 0 CT1 36 (SUTURE) ×3 IMPLANT
SUT VIC AB 1 CT1 36 (SUTURE) ×3 IMPLANT
SUT VIC AB 2-0 CT1 27 (SUTURE) ×6
SUT VIC AB 2-0 CT1 TAPERPNT 27 (SUTURE) ×2 IMPLANT
TRAY FOLEY MTR SLVR 16FR STAT (SET/KITS/TRAYS/PACK) ×3 IMPLANT
YANKAUER SUCT BULB TIP 10FT TU (MISCELLANEOUS) ×3 IMPLANT

## 2018-02-23 NOTE — Anesthesia Preprocedure Evaluation (Addendum)
Anesthesia Evaluation  Patient identified by MRN, date of birth, ID band Patient awake    Reviewed: Allergy & Precautions, NPO status , Patient's Chart, lab work & pertinent test results  History of Anesthesia Complications (+) AWARENESS UNDER ANESTHESIA and history of anesthetic complications  Airway Mallampati: II  TM Distance: >3 FB Neck ROM: Full    Dental  (+) Caps, Teeth Intact   Pulmonary asthma ,    Pulmonary exam normal breath sounds clear to auscultation       Cardiovascular hypertension, Pt. on medications + Peripheral Vascular Disease and + DVT  Normal cardiovascular exam Rhythm:Regular Rate:Normal  Hx/o DVT and PTE post op 2006   Neuro/Psych Hx/o dural tear and hematoma after C-spine surgery with temporary paralysis which resolved except for some residual numbness and weakness right thigh negative psych ROS   GI/Hepatic Neg liver ROS, GERD  Medicated and Controlled,  Endo/Other  diabetes, Well Controlled, Type 2, Oral Hypoglycemic AgentsHyperlipidemia Osteoporosis  Renal/GU Renal InsufficiencyRenal disease  negative genitourinary   Musculoskeletal  (+) Arthritis , Osteoarthritis,  Lumbar scoliosis Chronic back pain Hx/o DDD S/P fusion lumbar spine and c-spine   Abdominal   Peds  Hematology  (+) anemia ,   Anesthesia Other Findings   Reproductive/Obstetrics                           Anesthesia Physical Anesthesia Plan  ASA: III  Anesthesia Plan: General   Post-op Pain Management:    Induction: Intravenous  PONV Risk Score and Plan: 4 or greater and Ondansetron, Treatment may vary due to age or medical condition and Dexamethasone  Airway Management Planned: Video Laryngoscope Planned and Oral ETT  Additional Equipment:   Intra-op Plan:   Post-operative Plan: Extubation in OR  Informed Consent: I have reviewed the patients History and Physical, chart, labs and  discussed the procedure including the risks, benefits and alternatives for the proposed anesthesia with the patient or authorized representative who has indicated his/her understanding and acceptance.   Dental advisory given  Plan Discussed with: CRNA, Anesthesiologist and Surgeon  Anesthesia Plan Comments: (Will use 6.0 or 6.5 ETT)       Anesthesia Quick Evaluation

## 2018-02-23 NOTE — Anesthesia Procedure Notes (Signed)
Procedure Name: Intubation Date/Time: 02/23/2018 10:15 AM Performed by: Mitzie Na, CRNA Pre-anesthesia Checklist: Patient identified, Emergency Drugs available, Suction available, Patient being monitored and Timeout performed Patient Re-evaluated:Patient Re-evaluated prior to induction Oxygen Delivery Method: Circle system utilized Preoxygenation: Pre-oxygenation with 100% oxygen Induction Type: IV induction Ventilation: Mask ventilation without difficulty Laryngoscope Size: Glidescope and 3 Grade View: Grade I Tube type: Oral Tube size: 6.5 mm Number of attempts: 1 Airway Equipment and Method: Video-laryngoscopy Placement Confirmation: ETT inserted through vocal cords under direct vision,  positive ETCO2 and breath sounds checked- equal and bilateral Secured at: 22 cm Tube secured with: Tape Dental Injury: Teeth and Oropharynx as per pre-operative assessment

## 2018-02-23 NOTE — Anesthesia Postprocedure Evaluation (Signed)
Anesthesia Post Note  Patient: [name]  Procedure(s) Performed: RIGHT TOTAL HIP ARTHROPLASTY ANTERIOR APPROACH (Right Hip)     Patient location during evaluation: PACU Anesthesia Type: General Level of consciousness: awake and alert and oriented Pain management: pain level controlled Vital Signs Assessment: post-procedure vital signs reviewed and stable Respiratory status: spontaneous breathing, nonlabored ventilation and respiratory function stable Cardiovascular status: blood pressure returned to baseline and stable Postop Assessment: no apparent nausea or vomiting Anesthetic complications: no    Last Vitals:  Vitals:   02/23/18 1330 02/23/18 1333  BP: 139/61 139/61  Pulse: 66 70  Resp: 14 13  Temp:  (!) 36.3 C  SpO2: 100% 100%    Last Pain:  Vitals:   02/23/18 1333  TempSrc:   PainSc: 0-No pain                 Falana Clagg A.

## 2018-02-23 NOTE — H&P (Signed)
TOTAL HIP ADMISSION H&P  Patient is admitted for right total hip arthroplasty.  Subjective:  Chief Complaint: right hip pain  HPI: [name], 77 y.o. female, has a history of pain and functional disability in the right hip(s) due to arthritis and patient has failed non-surgical conservative treatments for greater than 12 weeks to include NSAID's and/or analgesics, use of assistive devices and activity modification.  Onset of symptoms was abrupt starting 1 years ago with rapidlly worsening course since that time.The patient noted no past surgery on the right hip(s).  Patient currently rates pain in the right hip at 10 out of 10 with activity. Patient has night pain, worsening of pain with activity and weight bearing, trendelenberg gait, pain that interfers with activities of daily living, pain with passive range of motion and crepitus. Patient has evidence of subchondral cysts, subchondral sclerosis, periarticular osteophytes and joint space narrowing by imaging studies. This condition presents safety issues increasing the risk of falls.  There is no current active infection.  Patient Active Problem List   Diagnosis Date Noted  . Cellulitis 12/28/2017  . Unilateral primary osteoarthritis, right hip 12/14/2017  . Type 2 diabetes mellitus with diabetic neuropathy ([place]) 06/08/2017  . Neuropathic pain 03/30/2017  . Seasonal allergies 03/30/2017  . Lumbar scoliosis 08/12/2016  . Controlled diabetes mellitus type 2 with complications ([place]) 41/66/0630  . Scoliosis 11/24/2015  . Low back pain 09/29/2014  . Diabetes mellitus with peripheral vascular disease ([place]) 04/10/2013  . Left carotid bruit 04/10/2013  . Anemia 03/31/2013  . Degenerative arthritis of hip 06/01/2012  . POSTMENOPAUSAL SYNDROME 09/16/2009  . ARTHRALGIA 09/16/2009  . Seasonal and perennial allergic rhinitis 10/08/2007  . OSTEOPENIA 08/06/2007  . ELEVATED BLOOD PRESSURE WITHOUT DIAGNOSIS OF HYPERTENSION 08/06/2007  .  HYPERLIPIDEMIA 05/14/2007  . Asthma, mild intermittent 05/14/2007  . History of pulmonary embolus (PE) 08/31/2006   Past Medical History:  Diagnosis Date  . Anemia   . Arthritis   . Asthma    adult onset  . Basal cell cancer    LUE; Porokeratosis also  . Chronic kidney disease 1963   strep in kidney due to strep throat-hospitalized 10 days  . Complication of anesthesia    small trachea  . Diabetes mellitus 2010   A1c 6.7%  . DVT (deep venous thrombosis) ([place]) 2006   post immobilization post cns surgery  . GERD (gastroesophageal reflux disease)    very mild  . Granulomatous lung disease ([place]) 2002   incidental Xray finding  . Hyperlipidemia   . Hypertension 2004   Hypertensive response on Stress Test  . Paralysis ([place]) 2006   post cervical fusion with spinal sac tear  with hematoma   . PTE (pulmonary thromboembolism) ([place]) 2006    Past Surgical History:  Procedure Laterality Date  . ABDOMINAL EXPOSURE N/A 08/12/2016   Procedure: ABDOMINAL EXPOSURE;  Surgeon: Rosetta Posner, MD;  Location: [facility];  Service: Vascular;  Laterality: N/A;  . ANTERIOR LAT LUMBAR FUSION N/A 08/12/2016   Procedure: LUMBAR TWO-THREE, LUMBAR THREE-FOUR, LUMBAR FOUR-FIVE  ANTEROLATERAL LUMBAR INTERBODY FUSION;  Surgeon: Erline Levine, MD;  Location: [facility];  Service: Neurosurgery;  Laterality: N/A;  L2-3 L3-4 L4-5 Anterolateral lumbar interbody fusion  . ANTERIOR LUMBAR FUSION N/A 08/12/2016   Procedure: Lumbar Five-Sacral One Anterior lumbar interbody fusion with Dr. Sherren Mocha Early to assist;  Surgeon: Erline Levine, MD;  Location: [facility];  Service: Neurosurgery;  Laterality: N/A;  L5-S1 Anterior lumbar interbody fusion with Dr. Sherren Mocha Early to  assist  . BUNIONECTOMY    . CERVICAL FUSION  2006   Dr Annette Stable, NS;post op hematoma & cns leak & urinary retention  . COLONOSCOPY  1992 & 2002   negative  . epidural steroids  2006   cervical spine  . LUMBAR PERCUTANEOUS PEDICLE SCREW 4 LEVEL N/A 08/12/2016   Procedure: LUMBAR  TWO-SACRAL ONE Percuataneous Pedicle Screws;  Surgeon: Erline Levine, MD;  Location: [facility];  Service: Neurosurgery;  Laterality: N/A;  . ROTATOR CUFF REPAIR  2009   Right  . SEPTOPLASTY    . TONSILLECTOMY  77 years old  . TOTAL HIP ARTHROPLASTY  06/01/2012   Procedure: TOTAL HIP ARTHROPLASTY ANTERIOR APPROACH;  Surgeon: Mcarthur Rossetti, MD;  Location: [facility];  Service: Orthopedics;  Laterality: Left;  Left Total Hip Arthroplasty  . TUBAL LIGATION      No current facility-administered medications for this encounter.    Current Outpatient Medications  Medication Sig Dispense Refill Last Dose  . acetaminophen (TYLENOL) 650 MG CR tablet Take 650-1,300 mg by mouth 2 (two) times daily as needed for pain.     [name] albuterol (PROAIR HFA) 108 (90 Base) MCG/ACT inhaler Inhale 2 puffs into the lungs every 6 (six) hours as needed for wheezing or shortness of breath. 3 Inhaler 3 12/27/2017 at Unknown time  . alendronate (FOSAMAX) 70 MG tablet TAKE 1 TABLET(70 MG) BY MOUTH EVERY 7 DAYS WITH A FULL GLASS OF WATER AND ON AN EMPTY STOMACH (Patient taking differently: Take 70 mg by mouth every Friday. ) 12 tablet 1   . Azelastine-Fluticasone 137-50 MCG/ACT SUSP 2 sprays each nostril bid (Patient taking differently: Place 2 sprays into both nostrils daily as needed (allergies). ) 1 Bottle 5 Taking  . Calcium Citrate-Vitamin D (CALCIUM + D PO) Take 1 tablet by mouth daily.     . celecoxib (CELEBREX) 200 MG capsule TAKE 1 CAPSULE(200 MG) BY MOUTH DAILY (Patient taking differently: Take 200 mg by mouth daily. ) 90 capsule 0 Taking  . cholecalciferol (VITAMIN D) 1000 UNITS tablet Take 1,000 Units by mouth daily.    Taking  . cyclobenzaprine (FLEXERIL) 10 MG tablet Take 1 tablet (10 mg total) by mouth 3 (three) times daily as needed for muscle spasms. (Patient taking differently: Take 5 mg by mouth at bedtime. ) 30 tablet 0   . ezetimibe (ZETIA) 10 MG tablet Take 1 tablet (10 mg total) by mouth daily. 30 tablet 2 Taking   . Fluticasone-Salmeterol (ADVAIR DISKUS) 250-50 MCG/DOSE AEPB USE 1 INHALATION TWO TIMES  DAILY ; RINSE MOUTH 180 each 3 Taking  . metFORMIN (GLUCOPHAGE-XR) 500 MG 24 hr tablet TAKE 1 TABLET(500 MG) BY MOUTH DAILY WITH BREAKFAST (Patient taking differently: Take 500 mg by mouth daily with supper. ) 90 tablet 1   . Multiple Vitamin (MULTIVITAMIN WITH MINERALS) TABS tablet Take 1 tablet by mouth daily.   Taking  . MYRBETRIQ 50 MG TB24 tablet TAKE 1 TABLET(50 MG) BY MOUTH DAILY (Patient taking differently: Take 50 mg by mouth daily as needed (for long trips out). ) 30 tablet 5   . Polyethyl Glycol-Propyl Glycol (SYSTANE OP) Place 1 drop into both eyes daily as needed (dry eyes).   Taking  . pregabalin (LYRICA) 100 MG capsule Take 1 capsule (100 mg total) by mouth 3 (three) times daily. 270 capsule 1   . Trolamine Salicylate (ASPERCREME EX) Apply 1 application topically daily as needed (back pain).   Taking  . cephALEXin (KEFLEX) 500 MG capsule Take 1  capsule (500 mg total) by mouth 4 (four) times daily. (Patient not taking: Reported on 02/07/2018) 90 capsule 1 Completed Course at Unknown time  . glucose blood (ONETOUCH VERIO) test strip Check blood sugar twice daily 200 each 1 Taking  . Multiple Vitamins-Minerals (OCUVITE EXTRA PO) Take 1 tablet by mouth daily.   Taking  . ONETOUCH DELICA LANCETS 76P MISC Check blood sugar twice daily 200 each 1 Taking   Allergies  Allergen Reactions  . Statins Other (See Comments)    Myalgias and muscle weakness    Social History   Tobacco Use  . Smoking status: Never Smoker  . Smokeless tobacco: Never Used  Substance Use Topics  . Alcohol use: Yes    Alcohol/week: 1.0 standard drinks    Types: 1 Glasses of wine per week    Comment: socially    Family History  Problem Relation Age of Onset  . COPD Father        emphysema  . Cancer Mother        cns cancer  . Diabetes Sister        TWIN sister ; also Fibromyalgia ; S/P stent 2004  . Heart disease  Sister        stents @ 60 & 60  . Stroke Maternal Grandmother        in  late 19s  . Transient ischemic attack Paternal Aunt      Review of Systems  Musculoskeletal: Positive for joint pain.  All other systems reviewed and are negative.   Objective:  Physical Exam  Constitutional: She is oriented to person, place, and time. She appears well-developed and well-nourished.  HENT:  Head: Normocephalic and atraumatic.  Eyes: Pupils are equal, round, and reactive to light. EOM are normal.  Neck: Normal range of motion. Neck supple.  Cardiovascular: Normal rate and regular rhythm.  Respiratory: Effort normal and breath sounds normal.  GI: Soft. Bowel sounds are normal.  Musculoskeletal:       Right hip: She exhibits decreased range of motion, decreased strength, tenderness and bony tenderness.  Neurological: She is oriented to person, place, and time.  Skin: Skin is warm and dry.  Psychiatric: She has a normal mood and affect.    Vital signs in last 24 hours:    Labs:   Estimated body mass index is 27.28 kg/m as calculated from the following:   Height as of 02/12/18: 5\' 3"  (1.6 m).   Weight as of 02/12/18: 69.9 kg.   Imaging Review Plain radiographs demonstrate severe degenerative joint disease of the right hip(s). The bone quality appears to be good for age and reported activity level.    Preoperative templating of the joint replacement has been completed, documented, and submitted to the Operating Room personnel in order to optimize intra-operative equipment management.     Assessment/Plan:  End stage arthritis, right hip(s)  The patient history, physical examination, clinical judgement of the provider and imaging studies are consistent with end stage degenerative joint disease of the right hip(s) and total hip arthroplasty is deemed medically necessary. The treatment options including medical management, injection therapy, arthroscopy and arthroplasty were discussed  at length. The risks and benefits of total hip arthroplasty were presented and reviewed. The risks due to aseptic loosening, infection, stiffness, dislocation/subluxation,  thromboembolic complications and other imponderables were discussed.  The patient acknowledged the explanation, agreed to proceed with the plan and consent was signed. Patient is being admitted for inpatient treatment for surgery, pain control, PT,  OT, prophylactic antibiotics, VTE prophylaxis, progressive ambulation and ADL's and discharge planning.The patient is planning to be discharged to skilled nursing facility

## 2018-02-23 NOTE — Care Management Note (Signed)
Case Management Note  Patient Details  Name: [name] MRN: [id number] Date of Birth: 1940/07/01  Subjective/Objective:   Plan for d/c to SNF, discharge planning per CSW. [phone]                 Action/Plan:   Expected Discharge Date:                  Expected Discharge Plan:  [place]  In-House Referral:  Clinical Social Work  Discharge planning Services  CM Consult  Post Acute Care Choice:  NA Choice offered to:  Patient  DME Arranged:  N/A DME Agency:  NA  HH Arranged:  NA HH Agency:  NA  Status of Service:  Completed, signed off  If discussed at [place] of Stay Meetings, dates discussed:    Additional Comments:  Guadalupe Maple, RN 02/23/2018, 3:24 PM

## 2018-02-23 NOTE — Op Note (Signed)
NAME: [name], [name] MEDICAL RECORD ZO:[id number] ACCOUNT [id number] DATE OF BIRTH:01/19/1941 FACILITY: [facility] LOCATION: [facility] PHYSICIAN:Bennett Ram Aretha Parrot, MD  OPERATIVE REPORT  DATE OF PROCEDURE:  02/23/2018  PREOPERATIVE DIAGNOSIS:  Severe osteoarthritis and degenerative joint disease, right hip.  POSTOPERATIVE DIAGNOSES:  Severe osteoarthritis and degenerative joint disease, right hip.  PROCEDURE:  Right total hip arthroplasty through direct anterior approach.  IMPLANTS:  DePuy Sector Gription acetabular component size 50, size 32+0 polyethylene liner, size 11 Corail femoral component with standard offset, size 32+1 metal hip ball.  SURGEON:  Vanita Panda. Magnus Ivan, MD  ASSISTANT:  Richardean Canal, PA-C.  ANESTHESIA:  General.  ANTIBIOTICS:  2 grams IV Ancef.  ESTIMATED BLOOD LOSS:  350 mL  COMPLICATIONS:  None.  INDICATIONS:  The patient is a 77 year old female with debilitating end-stage arthritis involving her right hip.  She has a significant flexion contracture of her knees and her right hip is incredibly stiff and painful.  She has a significant leg length  discrepancy as well with the right leg being shorter than the left.  She understands with a total hip arthroplasty surgery we are going to be able to improve her leg lengths but I probably will not be able to get her out to length due to the significant  contractures.  She understands our goals are to decrease pain, improve mobility and overall improve quality of life.  She understands fully the risk of acute blood loss anemia, nerve or vessel injury, fracture, infection, dislocation, DVT and implant  failure.  DESCRIPTION OF PROCEDURE:  After informed consent was obtained and appropriate right hip was marked.  She was brought to the operating room where general anesthesia was obtained while she was on her stretcher.  A Foley catheter was placed and both feet  had traction boots applied to them.  Next, she was  placed supine on the Hana fracture table with a perineal post in place and both legs in line skeletal traction devices and no traction applied.  Her right operative hip was prepped and draped with  DuraPrep and sterile drapes.  A time-out was called and she was identified, correct patient, correct right hip.  I then made an incision just inferior and posterior to the anterior superior iliac spine and carried this obliquely down the leg.  We  dissected down tensor fascia lata muscle.  Tensor fascia was divided longitudinally to proceed with a direct anterior approach to the hip.  We identified and cauterized circumflex vessels.  I then identified the hip capsule, opened up the capsule L-type  format finding a very large joint effusion and significant arthritis around her right hip.  We had to actually remove a significant amount of her joint capsule to get into the joint as well.  We then made our femoral neck cut with an oscillating saw just  proximal to the lesser trochanter and completed this with an osteotome.  We placed a corkscrew guide in the femoral head and removed the femoral head in its entirety and found it to be flattened and completely devoid of cartilage.  There was significant  synovitis and arthropathy around her right hip.  I used a #15 blade to remove significant synovial tissue and removed remnants of the acetabular labrum.  I then placed a bent Hohmann over the medial acetabular rim and began reaming from a size 43 reamer  in stepwise increments up to a size 49.  With that being said, the last reamer was placed under direct fluoroscopy, so  I could obtain my depth of reaming by inclination and anteversion.  I then placed the real DePuy Sector Gription acetabular component  size 50 and a 32+0 polyethylene liner for that size acetabular component.  Attention was then turned to the femur.  With the leg externally rotated to 120 degrees, extended and adducted we were able to place a Mueller  retractor medially and Homans  retractor behind the greater trochanter and then began broaching from a size 8 broach, going up to a size 11 using the Corail broaching system.  We then trialed a standard offset femoral neck and a 32+1 hip ball.  It was difficult to reduce in the pelvis  and acetabulum but we were able to do so.  She is incredibly tight and this did improve her leg length significantly and somewhat of her offset, but she was still short.  However, due to the significant tightness, I was not prepared to go up any higher  more in length.  It was even hard to dislocate.  Once we dislocated it and removed the trial components, we then placed the real Corail femoral component with standard offset size 11 and the real 32+1 into to the stem.  We placed a size 11 without  difficulty.  We reduced the acetabulum and it was stable.  We then irrigated the soft tissue with normal saline solution using pulsatile lavage.  There was no joint capsule to close and we closed the tensor fascia with interrupted #1 Vicryl suture  followed by 0 in the deep tissue, 2-0 Vicryl subcutaneous tissue and interrupted staples on the skin.  Xeroform well-padded sterile dressing was applied.  She was awakened, extubated, and taken to recovery room in stable condition.  All final counts were  correct.  There were no complications noted.  Of note, Rexene Edison, PA-C, assisted in the entire case.  Assistance was crucial for facilitating all aspects of this case.  TN/NUANCE  D:02/23/2018 T:02/23/2018 JOB:[id number]

## 2018-02-23 NOTE — Transfer of Care (Signed)
Immediate Anesthesia Transfer of Care Note  Patient: [name]  Procedure(s) Performed: RIGHT TOTAL HIP ARTHROPLASTY ANTERIOR APPROACH (Right Hip)  Patient Location: PACU  Anesthesia Type:General  Level of Consciousness: awake, alert , oriented and patient cooperative  Airway & Oxygen Therapy: Patient Spontanous Breathing and Patient connected to face mask oxygen  Post-op Assessment: Report given to RN, Post -op Vital signs reviewed and stable and Patient moving all extremities  Post vital signs: Reviewed and stable  Last Vitals:  Vitals Value Taken Time  BP 160/77 02/23/2018 12:15 PM  Temp    Pulse 75 02/23/2018 12:18 PM  Resp 16 02/23/2018 12:18 PM  SpO2 100 % 02/23/2018 12:18 PM  Vitals shown include unvalidated device data.  Last Pain:  Vitals:   02/23/18 0849  TempSrc:   PainSc: 0-No pain         Complications: No apparent anesthesia complications

## 2018-02-23 NOTE — Plan of Care (Signed)

## 2018-02-23 NOTE — Brief Op Note (Signed)
02/23/2018  11:45 AM  PATIENT:  [name]  77 y.o. female  PRE-OPERATIVE DIAGNOSIS:  Osteoarthritis Right Hip  POST-OPERATIVE DIAGNOSIS:  Osteoarthritis Right Hip  PROCEDURE:  Procedure(s): RIGHT TOTAL HIP ARTHROPLASTY ANTERIOR APPROACH (Right)  SURGEON:  Surgeon(s) and Role:    Mcarthur Rossetti, MD - Primary  PHYSICIAN ASSISTANT: Benita Stabile, PA-C  ANESTHESIA:   general  EBL:  200 mL   COUNTS:  YES  DICTATION: .Other Dictation: Dictation Number [id number]  PLAN OF CARE: Admit to inpatient   PATIENT DISPOSITION:  PACU - hemodynamically stable.   Delay start of Pharmacological VTE agent (>24hrs) due to surgical blood loss or risk of bleeding: no

## 2018-02-23 NOTE — Plan of Care (Signed)
  Problem: Education: Goal: Knowledge of General Education information will improve Description Including pain rating scale, medication(s)/side effects and non-pharmacologic comfort measures Outcome: Progressing   Problem: Health Behavior/Discharge Planning: Goal: Ability to manage health-related needs will improve Outcome: Progressing   Problem: Clinical Measurements: Goal: Ability to maintain clinical measurements within normal limits will improve Outcome: Progressing Goal: Will remain free from infection Outcome: Progressing Goal: Cardiovascular complication will be avoided Outcome: Progressing   Problem: Activity: Goal: Risk for activity intolerance will decrease Outcome: Progressing   Problem: Nutrition: Goal: Adequate nutrition will be maintained Outcome: Progressing   Problem: Elimination: Goal: Will not experience complications related to bowel motility Outcome: Progressing Goal: Will not experience complications related to urinary retention Outcome: Progressing   Problem: Pain Managment: Goal: General experience of comfort will improve Outcome: Progressing   Problem: Safety: Goal: Ability to remain free from injury will improve Outcome: Progressing   Problem: Skin Integrity: Goal: Risk for impaired skin integrity will decrease Outcome: Progressing   Problem: Education: Goal: Knowledge of the prescribed therapeutic regimen will improve Outcome: Progressing Goal: Understanding of discharge needs will improve Outcome: Progressing Goal: Individualized Educational Video(s) Outcome: Progressing   Problem: Activity: Goal: Ability to avoid complications of mobility impairment will improve Outcome: Progressing Goal: Ability to tolerate increased activity will improve Outcome: Progressing   Problem: Clinical Measurements: Goal: Postoperative complications will be avoided or minimized Outcome: Progressing   Problem: Pain Management: Goal: Pain level will  decrease with appropriate interventions Outcome: Progressing   Problem: Skin Integrity: Goal: Will show signs of wound healing Outcome: Progressing

## 2018-02-23 NOTE — Evaluation (Signed)
Physical Therapy Evaluation Patient Details Name: [name] MRN: [id number] DOB: 1941-02-15 Today's Date: 02/23/2018   History of Present Illness  Pt is a 77 YO female s/p R DA-THA on 02/23/18. PMH includes cellulitis admitted to [facility] 1 month ago, DMII, neuropathy, lumbar scoliosis, PVD, osteopenia, HLD, hx of PE/DVT in 2006. Surgical history includes lumbar fusion 2018, cervical fusion 5093 with complications, R RTC repair, L DA-THA 2014.  Clinical Impression   Pt s/p R DA-THA. Pt presents with difficulty performing bed mobility, decreased tolerance for ambulation, and some pre-existing and surgery-related LLE weakness. Pt to benefit from acute PT to address deficits. Pt ambulated hallway distance with PT today, with min guard assist. PT to progress mobility as able, will continue to follow acutely.     Follow Up Recommendations Follow surgeon's recommendation for DC plan and follow-up therapies;Supervision for mobility/OOB(SNF )    Equipment Recommendations  Rolling walker with 5" wheels    Recommendations for Other Services       Precautions / Restrictions Precautions Precautions: Fall Restrictions Weight Bearing Restrictions: No Other Position/Activity Restrictions: WBAT       Mobility  Bed Mobility Overal bed mobility: Needs Assistance Bed Mobility: Supine to Sit     Supine to sit: Min assist;HOB elevated     General bed mobility comments: Min assist for RLE management, scooting to EOB. Increased time to perform.   Transfers Overall transfer level: Needs assistance Equipment used: Rolling walker (2 wheeled) Transfers: Sit to/from Stand Sit to Stand: From elevated surface;Min assist         General transfer comment: Increased time to come to standing; assist for power up and pt with self-steadying with RW upon standing. Verbal cuing for hand placement on RW.   Ambulation/Gait Ambulation/Gait assistance: Min guard;+2 safety/equipment(chair follow ) Gait Distance  (Feet): 35 Feet Assistive device: Rolling walker (2 wheeled) Gait Pattern/deviations: Step-to pattern;Decreased stride length;Decreased weight shift to right Gait velocity: decr   General Gait Details: min guard for safety. Verbal cuing for sequencing and placement in RW.   Stairs            Wheelchair Mobility    Modified Rankin (Stroke Patients Only)       Balance Overall balance assessment: Mild deficits observed, not formally tested                                           Pertinent Vitals/Pain Pain Assessment: 0-10 Pain Score: 0-No pain Pain Intervention(s): Monitored during session    Home Living Family/patient expects to be discharged to:: Other (Comment)(Rehab unit at [facility]; pt from independent living) Living Arrangements: Alone                    Prior Function Level of Independence: Independent with assistive device(s)         Comments: has rollators, has been using for the last 1.5 years      Hand Dominance   Dominant Hand: Right    Extremity/Trunk Assessment   Upper Extremity Assessment Upper Extremity Assessment: Overall WFL for tasks assessed    Lower Extremity Assessment Lower Extremity Assessment: Overall WFL for tasks assessed;RLE deficits/detail RLE Deficits / Details: suspected post-surgical R hip weakness; able to perform quad sets x3, assisted heel slides, ankle pumps. Pt with trembling and weakness of LLE, which pt states is baseline due to complications of previous cervical  fusion.  RLE Sensation: WNL    Cervical / Trunk Assessment Cervical / Trunk Assessment: Normal  Communication   Communication: No difficulties  Cognition Arousal/Alertness: Awake/alert Behavior During Therapy: WFL for tasks assessed/performed Overall Cognitive Status: Within Functional Limits for tasks assessed                                        General Comments      Exercises     Assessment/Plan     PT Assessment Patient needs continued PT services  PT Problem List Decreased strength;Pain;Decreased range of motion;Decreased activity tolerance;Decreased knowledge of use of DME;Decreased balance;Decreased mobility;Decreased safety awareness       PT Treatment Interventions DME instruction;Therapeutic activities;Patient/family education;Therapeutic exercise;Gait training;Balance training;Functional mobility training    PT Goals (Current goals can be found in the Care Plan section)  Acute Rehab PT Goals Patient Stated Goal: none stated  PT Goal Formulation: With patient Time For Goal Achievement: 03/09/18 Potential to Achieve Goals: Good    Frequency 7X/week   Barriers to discharge        Co-evaluation               AM-PAC PT "6 Clicks" Daily Activity  Outcome Measure Difficulty turning over in bed (including adjusting bedclothes, sheets and blankets)?: Unable Difficulty moving from lying on back to sitting on the side of the bed? : Unable Difficulty sitting down on and standing up from a chair with arms (e.g., wheelchair, bedside commode, etc,.)?: Unable Help needed moving to and from a bed to chair (including a wheelchair)?: A Little Help needed walking in hospital room?: None Help needed climbing 3-5 steps with a railing? : A Little 6 Click Score: 13    End of Session Equipment Utilized During Treatment: Gait belt Activity Tolerance: Patient tolerated treatment well Patient left: in chair;with call bell/phone within reach;with family/visitor present;with SCD's reapplied Nurse Communication: Mobility status PT Visit Diagnosis: Other abnormalities of gait and mobility (R26.89);Difficulty in walking, not elsewhere classified (R26.2)    Time: 1423-9532 PT Time Calculation (min) (ACUTE ONLY): 28 min   Charges:   PT Evaluation $PT Eval Low Complexity: 1 Low PT Treatments $Gait Training: 8-22 mins        Julien Girt, PT Acute Rehabilitation  Services Pager [phone]  Office [phone]  Roxine Caddy D Elonda Husky 02/23/2018, 6:58 PM

## 2018-02-24 LAB — BASIC METABOLIC PANEL
Anion gap: 6 (ref 5–15)
BUN: 15 mg/dL (ref 8–23)
CHLORIDE: 108 mmol/L (ref 98–111)
CO2: 26 mmol/L (ref 22–32)
CREATININE: 0.75 mg/dL (ref 0.44–1.00)
Calcium: 8.9 mg/dL (ref 8.9–10.3)
GFR calc Af Amer: >60 mL/min (ref >60)
GFR calc non Af Amer: >60 mL/min (ref >60)
Glucose, Bld: 153 mg/dL — ABNORMAL HIGH (ref 70–99)
Potassium: 4.7 mmol/L (ref 3.5–5.1)
SODIUM: 140 mmol/L (ref 135–145)

## 2018-02-24 LAB — CBC
HCT: 30.4 % — ABNORMAL LOW (ref 36.0–46.0)
HEMOGLOBIN: 10.2 g/dL — AB (ref 12.0–15.0)
MCH: 29.1 pg (ref 26.0–34.0)
MCHC: 33.6 g/dL (ref 30.0–36.0)
MCV: 86.6 fL (ref 78.0–100.0)
Platelets: 230 10*3/uL (ref 150–400)
RBC: 3.51 MIL/uL — ABNORMAL LOW (ref 3.87–5.11)
RDW: 14.8 % (ref 11.5–15.5)
WBC: 11.7 10*3/uL — ABNORMAL HIGH (ref 4.0–10.5)

## 2018-02-24 NOTE — Progress Notes (Signed)
Physical Therapy Treatment Patient Details Name: [name] MRN: [id number] DOB: 10/09/1940 Today's Date: 02/24/2018    History of Present Illness Pt is a 77 YO female s/p R DA-THA on 02/23/18. PMH includes cellulitis admitted to [facility] 1 month ago, DMII, neuropathy, lumbar scoliosis, PVD, osteopenia, HLD, hx of PE/DVT in 2006. Surgical history includes lumbar fusion 2018, cervical fusion 2006 with complications, R RTC repair, L DA-THA 2014.    PT Comments    Pt in good spirits and progressing well with mobility.   Follow Up Recommendations  Follow surgeon's recommendation for DC plan and follow-up therapies;Supervision for mobility/OOB     Equipment Recommendations  Rolling walker with 5" wheels    Recommendations for Other Services       Precautions / Restrictions Precautions Precautions: Fall Restrictions Weight Bearing Restrictions: No Other Position/Activity Restrictions: WBAT     Mobility  Bed Mobility Overal bed mobility: Needs Assistance Bed Mobility: Supine to Sit;Sit to Supine     Supine to sit: Supervision;HOB elevated Sit to supine: Supervision(HOB flat)   General bed mobility comments: Pt up in chair and requests back to same  Transfers Overall transfer level: Needs assistance Equipment used: Rolling walker (2 wheeled) Transfers: Sit to/from Stand Sit to Stand: Min guard         General transfer comment: Increased time to come to standing; assist for power up and pt with self-steadying with RW upon standing. Verbal cuing for hand placement on RW.   Ambulation/Gait Ambulation/Gait assistance: Min guard Gait Distance (Feet): 120 Feet Assistive device: Rolling walker (2 wheeled) Gait Pattern/deviations: Step-to pattern;Decreased stride length;Decreased weight shift to right Gait velocity: decr   General Gait Details: min guard for safety. Verbal cuing for sequencing and placement in RW.    Stairs             Wheelchair Mobility    Modified  Rankin (Stroke Patients Only)       Balance Overall balance assessment: Mild deficits observed, not formally tested                                          Cognition Arousal/Alertness: Awake/alert Behavior During Therapy: WFL for tasks assessed/performed Overall Cognitive Status: Within Functional Limits for tasks assessed                                        Exercises Total Joint Exercises Ankle Circles/Pumps: AROM;Both;15 reps;Supine Quad Sets: AROM;Both;10 reps;Supine Heel Slides: AAROM;Right;20 reps;Supine Hip ABduction/ADduction: AAROM;Right;15 reps;Supine    General Comments        Pertinent Vitals/Pain Pain Assessment: No/denies pain Pain Score: 0-No pain    Home Living Family/patient expects to be discharged to:: Skilled nursing facility(rehab at [facility]) Living Arrangements: Alone                  Prior Function Level of Independence: Independent with assistive device(s)      Comments: has rollators, has been using for the last 1.5 years; uses AE for LBD every since back surgery last year   PT Goals (current goals can now be found in the care plan section) Acute Rehab PT Goals Patient Stated Goal: to go to rehab at [facility] and then back to Independent living PT Goal Formulation: With patient Time For Goal Achievement: 03/09/18  Potential to Achieve Goals: Good Progress towards PT goals: Progressing toward goals    Frequency    7X/week      PT Plan Current plan remains appropriate    Co-evaluation              AM-PAC PT "6 Clicks" Daily Activity  Outcome Measure  Difficulty turning over in bed (including adjusting bedclothes, sheets and blankets)?: Unable Difficulty moving from lying on back to sitting on the side of the bed? : Unable Difficulty sitting down on and standing up from a chair with arms (e.g., wheelchair, bedside commode, etc,.)?: Unable Help needed moving to and from a bed to  chair (including a wheelchair)?: A Little Help needed walking in hospital room?: A Little Help needed climbing 3-5 steps with a railing? : A Little 6 Click Score: 12    End of Session Equipment Utilized During Treatment: Gait belt Activity Tolerance: Patient tolerated treatment well Patient left: in chair;with call bell/phone within reach;with family/visitor present;with SCD's reapplied Nurse Communication: Mobility status PT Visit Diagnosis: Other abnormalities of gait and mobility (R26.89);Difficulty in walking, not elsewhere classified (R26.2)     Time: 1610-9604 PT Time Calculation (min) (ACUTE ONLY): 33 min  Charges:  $Gait Training: 8-22 mins $Therapeutic Exercise: 8-22 mins                     Mauro Kaufmann PT Acute Rehabilitation Services Pager [phone] Office [phone]     Stehanie Ekstrom 02/24/2018, 3:35 PM

## 2018-02-24 NOTE — Progress Notes (Signed)
Physical Therapy Treatment Patient Details Name: [name] MRN: [id number] DOB: 1940/08/17 Today's Date: 02/24/2018    History of Present Illness Pt is a 77 YO female s/p R DA-THA on 02/23/18. PMH includes cellulitis admitted to [facility] 1 month ago, DMII, neuropathy, lumbar scoliosis, PVD, osteopenia, HLD, hx of PE/DVT in 2006. Surgical history includes lumbar fusion 2018, cervical fusion 2006 with complications, R RTC repair, L DA-THA 2014.    PT Comments    Pt continues very motivated and progressing well with mobility.   Follow Up Recommendations  Follow surgeon's recommendation for DC plan and follow-up therapies;Supervision for mobility/OOB     Equipment Recommendations  Rolling walker with 5" wheels    Recommendations for Other Services       Precautions / Restrictions Precautions Precautions: Fall Restrictions Weight Bearing Restrictions: No Other Position/Activity Restrictions: WBAT     Mobility  Bed Mobility Overal bed mobility: Needs Assistance Bed Mobility: Sit to Supine     Supine to sit: Supervision;HOB elevated Sit to supine: Min assist   General bed mobility comments: cues for sequence and use of L LE to self assist  Transfers Overall transfer level: Needs assistance Equipment used: Rolling walker (2 wheeled) Transfers: Sit to/from Stand Sit to Stand: Min guard         General transfer comment: cues for LE management and use of UEs to self assist  Ambulation/Gait Ambulation/Gait assistance: Min guard Gait Distance (Feet): 175 Feet Assistive device: Rolling walker (2 wheeled) Gait Pattern/deviations: Step-to pattern;Decreased stride length;Decreased weight shift to right Gait velocity: decr   General Gait Details: min guard for safety. Verbal cuing for sequencing and placement in RW.    Stairs             Wheelchair Mobility    Modified Rankin (Stroke Patients Only)       Balance Overall balance assessment: Mild deficits observed,  not formally tested                                          Cognition Arousal/Alertness: Awake/alert Behavior During Therapy: WFL for tasks assessed/performed Overall Cognitive Status: Within Functional Limits for tasks assessed                                        Exercises Total Joint Exercises Ankle Circles/Pumps: AROM;Both;15 reps;Supine Quad Sets: AROM;Both;10 reps;Supine Heel Slides: AAROM;Right;20 reps;Supine Hip ABduction/ADduction: AAROM;Right;15 reps;Supine    General Comments        Pertinent Vitals/Pain Pain Assessment: No/denies pain Pain Score: 0-No pain Pain Intervention(s): Monitored during session    Home Living Family/patient expects to be discharged to:: Skilled nursing facility(rehab at [facility]) Living Arrangements: Alone                  Prior Function Level of Independence: Independent with assistive device(s)      Comments: has rollators, has been using for the last 1.5 years; uses AE for LBD every since back surgery last year   PT Goals (current goals can now be found in the care plan section) Acute Rehab PT Goals Patient Stated Goal: to go to rehab at [facility] and then back to Independent living PT Goal Formulation: With patient Time For Goal Achievement: 03/09/18 Potential to Achieve Goals: Good Progress towards PT goals: Progressing  toward goals    Frequency    7X/week      PT Plan Current plan remains appropriate    Co-evaluation              AM-PAC PT "6 Clicks" Daily Activity  Outcome Measure  Difficulty turning over in bed (including adjusting bedclothes, sheets and blankets)?: Unable Difficulty moving from lying on back to sitting on the side of the bed? : Unable Difficulty sitting down on and standing up from a chair with arms (e.g., wheelchair, bedside commode, etc,.)?: Unable Help needed moving to and from a bed to chair (including a wheelchair)?: A Little Help needed  walking in hospital room?: A Little Help needed climbing 3-5 steps with a railing? : A Little 6 Click Score: 12    End of Session Equipment Utilized During Treatment: Gait belt Activity Tolerance: Patient tolerated treatment well Patient left: in bed;with call bell/phone within reach Nurse Communication: Mobility status PT Visit Diagnosis: Other abnormalities of gait and mobility (R26.89);Difficulty in walking, not elsewhere classified (R26.2)     Time: 1355-1420 PT Time Calculation (min) (ACUTE ONLY): 25 min  Charges:  $Gait Training: 8-22 mins $Therapeutic Exercise: 8-22 mins $Therapeutic Activity: 8-22 mins                     Mauro Kaufmann PT Acute Rehabilitation Services Pager [phone] Office [phone]    Ancelmo Hunt 02/24/2018, 3:41 PM

## 2018-02-24 NOTE — Evaluation (Signed)
Occupational Therapy Evaluation Patient Details Name: [name] MRN: [id number] DOB: 1940/09/15 Today's Date: 02/24/2018    History of Present Illness Pt is a 77 YO female s/p R DA-THA on 02/23/18. PMH includes cellulitis admitted to [facility] 1 month ago, DMII, neuropathy, lumbar scoliosis, PVD, osteopenia, HLD, hx of PE/DVT in 2006. Surgical history includes lumbar fusion 2018, cervical fusion 2956 with complications, R RTC repair, L DA-THA 2014.   Clinical Impression   This 77 yo female admitted with above presents to acute OT with decreased AROM RLE, increased pain RLE, chronic back issues, weakness of RLE all affecting safety and independence with basic ADLs. She will benefit from acute OT with follow up OT at SNF to get back to PLOF.    Follow Up Recommendations  SNF;Supervision/Assistance - 24 hour    Equipment Recommendations  None recommended by OT       Precautions / Restrictions Precautions Precautions: Fall Restrictions Weight Bearing Restrictions: No Other Position/Activity Restrictions: WBAT       Mobility Bed Mobility Overal bed mobility: Needs Assistance Bed Mobility: Supine to Sit;Sit to Supine     Supine to sit: Supervision;HOB elevated Sit to supine: Supervision(HOB flat)      Transfers Overall transfer level: Needs assistance Equipment used: Rolling walker (2 wheeled) Transfers: Sit to/from Stand Sit to Stand: Min guard                  ADL either performed or assessed with clinical judgement   ADL Overall ADL's : Needs assistance/impaired Eating/Feeding: Independent;Sitting   Grooming: Min guard;Standing   Upper Body Bathing: Set up;Sitting   Lower Body Bathing: Moderate assistance Lower Body Bathing Details (indicate cue type and reason): without AE; [place] A sit<>stand Upper Body Dressing : Set up;Sitting   Lower Body Dressing: Moderate assistance Lower Body Dressing Details (indicate cue type and reason): without AE; [place] A  sit<>stand Toilet Transfer: Min guard;Ambulation;RW;BSC Toilet Transfer Details (indicate cue type and reason): over toilet Toileting- Clothing Manipulation and Hygiene: Min guard;Sit to/from stand               Vision Patient Visual Report: No change from baseline              Pertinent Vitals/Pain Pain Assessment: No/denies pain Pain Score: 0-No pain     Hand Dominance Right   Extremity/Trunk Assessment Upper Extremity Assessment Upper Extremity Assessment: Overall WFL for tasks assessed           Communication Communication Communication: No difficulties   Cognition Arousal/Alertness: Awake/alert Behavior During Therapy: WFL for tasks assessed/performed Overall Cognitive Status: Within Functional Limits for tasks assessed                                                Home Living Family/patient expects to be discharged to:: Skilled nursing facility(rehab at [place]) Living Arrangements: Alone                                      Prior Functioning/Environment Level of Independence: Independent with assistive device(s)        Comments: has rollators, has been using for the last 1.5 years; uses AE for LBD every since back surgery last year        OT Problem List: Decreased  strength;Decreased range of motion;Impaired balance (sitting and/or standing);Decreased knowledge of use of DME or AE      OT Treatment/Interventions: Self-care/ADL training;Balance training;DME and/or AE instruction;Patient/family education    OT Goals(Current goals can be found in the care plan section) Acute Rehab OT Goals Patient Stated Goal: to go to rehab at [facility] and then back to [facility] living OT Goal Formulation: With patient Time For Goal Achievement: 03/03/18 Potential to Achieve Goals: Good  OT Frequency: Min 2X/week   Barriers to D/C: Decreased caregiver support             AM-PAC PT "6 Clicks" Daily Activity      Outcome Measure Help from another person eating meals?: None Help from another person taking care of personal grooming?: A Little Help from another person toileting, which includes using toliet, bedpan, or urinal?: A Little Help from another person bathing (including washing, rinsing, drying)?: A Lot Help from another person to put on and taking off regular upper body clothing?: A Little Help from another person to put on and taking off regular lower body clothing?: A Lot 6 Click Score: 17   End of Session Equipment Utilized During Treatment: Gait belt;Rolling walker  Activity Tolerance: Patient tolerated treatment well Patient left: in bed;with call bell/phone within reach;with bed alarm set  OT Visit Diagnosis: Unsteadiness on feet (R26.81);Other abnormalities of gait and mobility (R26.89);Muscle weakness (generalized) (M62.81)                Time: 8206-0156 OT Time Calculation (min): 34 min Charges:  OT General Charges $OT Visit: 1 Visit OT Evaluation $OT Eval Moderate Complexity: 1 Mod OT Treatments $Self Care/Home Management : 8-22 mins   Golden Circle, OTR/L Acute [employer] Pager [phone] Office [phone]

## 2018-02-24 NOTE — Discharge Instructions (Signed)

## 2018-02-24 NOTE — Progress Notes (Signed)
Subjective: Patient stable.  Making good progress.  Ambulating in hall.   Objective: Vital signs in last 24 hours: Temp:  [97.4 F (36.3 C)-98.2 F (36.8 C)] 98.2 F (36.8 C) (09/28 0935) Pulse Rate:  [66-94] 86 (09/28 0935) Resp:  [13-22] 16 (09/28 0935) BP: (120-160)/(57-77) 122/65 (09/28 0935) SpO2:  [94 %-100 %] 94 % (09/28 0935)  Intake/Output from previous day: 09/27 0701 - 09/28 0700 In: 2727.4 [P.O.:380; I.V.:2147.4; IV Piggyback:200] Out: 2650 [Urine:2450; Blood:200] Intake/Output this shift: No intake/output data recorded.  Exam:  Dorsiflexion/Plantar flexion intact  Labs: Recent Labs    02/24/18 0402  HGB 10.2*   Recent Labs    02/24/18 0402  WBC 11.7*  RBC 3.51*  HCT 30.4*  PLT 230   Recent Labs    02/24/18 0402  NA 140  K 4.7  CL 108  CO2 26  BUN 15  CREATININE 0.75  GLUCOSE 153*  CALCIUM 8.9   No results for input(s): LABPT, INR in the last 72 hours.  Assessment/Plan: Plan at this time is to continue with ambulation.  Anticipate transfer back to skilled nursing/assisted living on Monday   Anderson Malta 02/24/2018, 10:56 AM

## 2018-02-25 NOTE — NC FL2 (Signed)
[place] LEVEL OF CARE SCREENING TOOL     IDENTIFICATION  Patient Name: [name] Birthdate: 10/04/1940 Sex: female Admission Date (Current Location): 02/23/2018  [facility] and [place] Number:  Herbalist and Address:  [facility],  [place], [place]      Provider Number: [id number]  Attending Physician Name and Address:  Mcarthur Rossetti  Relative Name and Phone Number:  Collene Gobble: [phone]    Current Level of Care: Hospital Recommended Level of Care: [place] Prior Approval Number:    Date Approved/Denied:   PASRR Number:    Discharge Plan: SNF    Current Diagnoses: Patient Active Problem List   Diagnosis Date Noted  . Status post total replacement of right hip 02/23/2018  . Cellulitis 12/28/2017  . Unilateral primary osteoarthritis, right hip 12/14/2017  . Type 2 diabetes mellitus with diabetic neuropathy ([place]) 06/08/2017  . Neuropathic pain 03/30/2017  . Seasonal allergies 03/30/2017  . Lumbar scoliosis 08/12/2016  . Controlled diabetes mellitus type 2 with complications ([place]) 89/38/1017  . Scoliosis 11/24/2015  . Low back pain 09/29/2014  . Diabetes mellitus with peripheral vascular disease ([place]) 04/10/2013  . Left carotid bruit 04/10/2013  . Anemia 03/31/2013  . Degenerative arthritis of hip 06/01/2012  . POSTMENOPAUSAL SYNDROME 09/16/2009  . ARTHRALGIA 09/16/2009  . Seasonal and perennial allergic rhinitis 10/08/2007  . OSTEOPENIA 08/06/2007  . ELEVATED BLOOD PRESSURE WITHOUT DIAGNOSIS OF HYPERTENSION 08/06/2007  . HYPERLIPIDEMIA 05/14/2007  . Asthma, mild intermittent 05/14/2007  . History of pulmonary embolus (PE) 08/31/2006    Orientation RESPIRATION BLADDER Height & Weight     Self, Time, Situation, Place  Normal Continent Weight: 154 lb (69.9 kg) Height:  5\' 3"  (160 cm)  BEHAVIORAL SYMPTOMS/MOOD NEUROLOGICAL BOWEL NUTRITION STATUS      Continent Diet(Regular)   AMBULATORY STATUS COMMUNICATION OF NEEDS Skin   Limited Assist Verbally Surgical wounds(Right hip surgical incision)                       Personal Care Assistance Level of Assistance  Bathing, Feeding, Dressing Bathing Assistance: Limited assistance Feeding assistance: Independent Dressing Assistance: Limited assistance     Functional Limitations Info  Sight, Hearing, Speech Sight Info: Adequate Hearing Info: Adequate      SPECIAL CARE FACTORS FREQUENCY  PT (By licensed PT), OT (By licensed OT)     PT Frequency: 5x/week OT Frequency: 5x/week            Contractures Contractures Info: Not present    Additional Factors Info  Code Status, Allergies Code Status Info: Full Allergies Info: STATINS           Current Medications (02/25/2018):  This is the current hospital active medication list Current Facility-Administered Medications  Medication Dose Route Frequency Provider Last Rate Last Dose  . 0.9 %  sodium chloride infusion   Intravenous Continuous Mcarthur Rossetti, MD 75 mL/hr at 02/24/18 1731    . acetaminophen (TYLENOL) tablet 325-650 mg  325-650 mg Oral Q6H PRN Mcarthur Rossetti, MD   650 mg at 02/24/18 1701  . albuterol (PROVENTIL) (2.5 MG/3ML) 0.083% nebulizer solution 2.5 mg  2.5 mg Nebulization Q6H PRN Mcarthur Rossetti, MD      . alum & mag hydroxide-simeth (MAALOX/MYLANTA) 200-200-20 MG/5ML suspension 30 mL  30 mL Oral Q4H PRN Mcarthur Rossetti, MD      . aspirin chewable tablet 81 mg  81 mg Oral  BID Mcarthur Rossetti, MD   81 mg at 02/25/18 4627  . cholecalciferol (VITAMIN D) tablet 1,000 Units  1,000 Units Oral Daily Mcarthur Rossetti, MD   1,000 Units at 02/25/18 [phone]  . diphenhydrAMINE (BENADRYL) 12.5 MG/5ML elixir 12.5-25 mg  12.5-25 mg Oral Q4H PRN Mcarthur Rossetti, MD      . docusate sodium (COLACE) capsule 100 mg  100 mg Oral BID Mcarthur Rossetti, MD   100 mg at 02/25/18 0938  . ezetimibe  (ZETIA) tablet 10 mg  10 mg Oral Daily Mcarthur Rossetti, MD   10 mg at 02/25/18 1829  . HYDROmorphone (DILAUDID) injection 0.5-1 mg  0.5-1 mg Intravenous Q4H PRN Mcarthur Rossetti, MD      . menthol-cetylpyridinium (CEPACOL) lozenge 3 mg  1 lozenge Oral PRN Mcarthur Rossetti, MD       Or  . phenol (CHLORASEPTIC) mouth spray 1 spray  1 spray Mouth/Throat PRN Mcarthur Rossetti, MD      . metFORMIN (GLUCOPHAGE-XR) 24 hr tablet 500 mg  500 mg Oral Q supper Mcarthur Rossetti, MD   500 mg at 02/24/18 1659  . methocarbamol (ROBAXIN) tablet 500 mg  500 mg Oral Q6H PRN Mcarthur Rossetti, MD   500 mg at 02/24/18 1659   Or  . methocarbamol (ROBAXIN) 500 mg in dextrose 5 % 50 mL IVPB  500 mg Intravenous Q6H PRN Mcarthur Rossetti, MD   Stopped at 02/23/18 1306  . metoCLOPramide (REGLAN) tablet 5-10 mg  5-10 mg Oral Q8H PRN Mcarthur Rossetti, MD       Or  . metoCLOPramide (REGLAN) injection 5-10 mg  5-10 mg Intravenous Q8H PRN Mcarthur Rossetti, MD      . mometasone-formoterol [facility]) 200-5 MCG/ACT inhaler 2 puff  2 puff Inhalation BID Mcarthur Rossetti, MD   2 puff at 02/25/18 0900  . multivitamin with minerals tablet 1 tablet  1 tablet Oral Daily Mcarthur Rossetti, MD   1 tablet at 02/25/18 [phone]  . ondansetron (ZOFRAN) tablet 4 mg  4 mg Oral Q6H PRN Mcarthur Rossetti, MD       Or  . ondansetron [place]) injection 4 mg  4 mg Intravenous Q6H PRN Mcarthur Rossetti, MD      . oxyCODONE (Oxy IR/ROXICODONE) immediate release tablet 10-15 mg  10-15 mg Oral Q4H PRN Mcarthur Rossetti, MD   10 mg at 02/25/18 0532  . oxyCODONE (Oxy IR/ROXICODONE) immediate release tablet 5-10 mg  5-10 mg Oral Q4H PRN Mcarthur Rossetti, MD   10 mg at 02/25/18 1515  . pantoprazole (PROTONIX) EC tablet 40 mg  40 mg Oral Daily Mcarthur Rossetti, MD   40 mg at 02/25/18 6967  . polyethylene glycol (MIRALAX / GLYCOLAX) packet 17 g  17 g Oral  Daily PRN Mcarthur Rossetti, MD      . pregabalin (LYRICA) capsule 100 mg  100 mg Oral TID Mcarthur Rossetti, MD   100 mg at 02/25/18 1515     Discharge Medications: Please see discharge summary for a list of discharge medications.  Relevant Imaging Results:  Relevant Lab Results:   Additional Information SSN: [id number]  Pricilla Holm, [place]

## 2018-02-25 NOTE — Clinical Social Work Note (Signed)
Clinical Social Work Assessment  Patient Details  Name: [name] MRN: [id number] Date of Birth: 1941-01-03  Date of referral:  02/23/18               Reason for consult:  Facility Placement                Permission sought to share information with:  Facility Art therapist granted to share information::  Yes, Verbal Permission Granted  Name::     Collene Gobble  Agency::  [facility]  Relationship::  Daughter  Contact Information:  [phone]  Housing/Transportation Living arrangements for the past 2 months:  Apartment Source of Information:  Patient Patient Interpreter Needed:  None Criminal Activity/Legal Involvement Pertinent to Current Situation/Hospitalization:  No - Comment as needed Significant Relationships:  Adult Children Lives with:  Self Do you feel safe going back to the place where you live?  Yes Need for family participation in patient care:  No (Coment)  Care giving concerns:  Patient lives alone and will need to go to short term rehab until she has regained strength to safely be by herself.   Social Worker assessment / plan:  CSW met with patient and son, [name], at bedside to discuss plans for discharge. Patient is a resident at [facility] where she lives alone in an apartment. She plans to go to their SNF for short term rehab. Patient was in good spirits about physical therapy and building up her strength after discharge. Patient had just finished up a session with PT and expressed that it was difficult but she is doing well.   Patient has son, [name], and daughter, [name], nearby who are a support to her. Patient says [facility] is aware of her surgery and knows she will be coming to their SNF for rehab.  CSW will complete FL2 and coordinate transition to [facility] SNF.  Employment status:  Retired Nurse, adult PT Recommendations:  [place] / Referral to community resources:  [place]  Patient/Family's Response to care:  Patient was pleasant and appreciative of care received during hospitalization. She has enjoyed working with PT here and is looking forward to discharging back to [facility] for additional rehab.  Patient/Family's Understanding of and Emotional Response to Diagnosis, Current Treatment, and Prognosis:  Patient understands need for rehab post discharge and understands that CSW will assist with transition to [facility] SNF.  Emotional Assessment Appearance:  Appears stated age Attitude/Demeanor/Rapport:  Engaged Affect (typically observed):  Appropriate, Pleasant Orientation:  Oriented to Self, Oriented to Place, Oriented to  Time, Oriented to Situation Alcohol / Substance use:  Not Applicable Psych involvement (Current and /or in the community):  No (Comment)  Discharge Needs  Concerns to be addressed:  Care Coordination Readmission within the last 30 days:  No Current discharge risk:  Physical Impairment Barriers to Discharge:  Continued Medical Work up   [employer], [place] 02/25/2018, 4:50 PM

## 2018-02-25 NOTE — Plan of Care (Signed)
Plan of care discussed with patient and family 

## 2018-02-25 NOTE — Progress Notes (Signed)
Subjective: Pt stable - pain ok   Objective: Vital signs in last 24 hours: Temp:  [97.7 F (36.5 C)-99.7 F (37.6 C)] 99.7 F (37.6 C) (09/29 0524) Pulse Rate:  [79-103] 103 (09/29 0524) Resp:  [16-20] 18 (09/29 0524) BP: (109-136)/(49-73) 136/52 (09/29 0524) SpO2:  [90 %-98 %] 90 % (09/29 0900)  Intake/Output from previous day: 09/28 0701 - 09/29 0700 In: 2181.7 [P.O.:755; I.V.:1426.7] Out: 850 [Urine:850] Intake/Output this shift: No intake/output data recorded.  Exam:  Dorsiflexion/Plantar flexion intact  Labs: Recent Labs    02/24/18 0402  HGB 10.2*   Recent Labs    02/24/18 0402  WBC 11.7*  RBC 3.51*  HCT 30.4*  PLT 230   Recent Labs    02/24/18 0402  NA 140  K 4.7  CL 108  CO2 26  BUN 15  CREATININE 0.75  GLUCOSE 153*  CALCIUM 8.9   No results for input(s): LABPT, INR in the last 72 hours.  Assessment/Plan: Plan dc tomorrow - stairs today with PT   G Alphonzo Severance 02/25/2018, 9:26 AM

## 2018-02-25 NOTE — Progress Notes (Signed)
Physical Therapy Treatment Patient Details Name: [name] MRN: [id number] DOB: Oct 12, 1940 Today's Date: 02/25/2018    History of Present Illness Pt is a 77 YO female s/p R DA-THA on 02/23/18. PMH includes cellulitis admitted to [facility] 1 month ago, DMII, neuropathy, lumbar scoliosis, PVD, osteopenia, HLD, hx of PE/DVT in 2006. Surgical history includes lumbar fusion 2018, cervical fusion 2006 with complications, R RTC repair, L DA-THA 2014.    PT Comments    Pt continues cooperative but ltd this am by fatigue and increased all over soreness.  Pt initially denies dizziness but BP with ambulation was 93/51 - pt admits to being dizzy but "I didn't say anything because I wanted to go" - RN aware.  Follow Up Recommendations  Follow surgeon's recommendation for DC plan and follow-up therapies;Supervision for mobility/OOB     Equipment Recommendations  Rolling walker with 5" wheels    Recommendations for Other Services       Precautions / Restrictions Precautions Precautions: Fall Restrictions Weight Bearing Restrictions: No Other Position/Activity Restrictions: WBAT     Mobility  Bed Mobility Overal bed mobility: Needs Assistance Bed Mobility: Supine to Sit     Supine to sit: Min guard;Mod assist     General bed mobility comments: cues for sequence and use of L LE to self assist  Transfers Overall transfer level: Needs assistance Equipment used: Rolling walker (2 wheeled) Transfers: Sit to/from Stand Sit to Stand: Min assist;From elevated surface         General transfer comment: cues for LE management and use of UEs to self assist  Ambulation/Gait Ambulation/Gait assistance: Min assist Gait Distance (Feet): 65 Feet Assistive device: Rolling walker (2 wheeled) Gait Pattern/deviations: Step-to pattern;Decreased step length - right;Decreased step length - left;Shuffle;Narrow base of support Gait velocity: decr   General Gait Details: min cues for posture and position  inside RW - increased time and distance ltd by pt fatigue   Stairs             Wheelchair Mobility    Modified Rankin (Stroke Patients Only)       Balance Overall balance assessment: Mild deficits observed, not formally tested                                          Cognition Arousal/Alertness: Awake/alert Behavior During Therapy: WFL for tasks assessed/performed Overall Cognitive Status: Within Functional Limits for tasks assessed                                        Exercises Total Joint Exercises Ankle Circles/Pumps: AROM;Both;15 reps;Supine Quad Sets: AROM;Both;10 reps;Supine Heel Slides: AAROM;Right;20 reps;Supine Hip ABduction/ADduction: AAROM;Right;15 reps;Supine    General Comments        Pertinent Vitals/Pain Pain Assessment: 0-10 Pain Score: 3  Pain Location: hurt all over - just stiff Pain Descriptors / Indicators: Aching;Sore Pain Intervention(s): Limited activity within patient's tolerance;Monitored during session;Premedicated before session;Ice applied    Home Living                      Prior Function            PT Goals (current goals can now be found in the care plan section) Acute Rehab PT Goals Patient Stated Goal: to go to rehab  at [facility] and then back to Independent living PT Goal Formulation: With patient Time For Goal Achievement: 03/09/18 Potential to Achieve Goals: Good Progress towards PT goals: Not progressing toward goals - comment(Pt with increased fatigue and soreness)    Frequency    7X/week      PT Plan Current plan remains appropriate    Co-evaluation              AM-PAC PT "6 Clicks" Daily Activity  Outcome Measure  Difficulty turning over in bed (including adjusting bedclothes, sheets and blankets)?: Unable Difficulty moving from lying on back to sitting on the side of the bed? : Unable Difficulty sitting down on and standing up from a chair with  arms (e.g., wheelchair, bedside commode, etc,.)?: Unable Help needed moving to and from a bed to chair (including a wheelchair)?: A Little Help needed walking in hospital room?: A Little Help needed climbing 3-5 steps with a railing? : A Lot 6 Click Score: 11    End of Session Equipment Utilized During Treatment: Gait belt Activity Tolerance: Patient limited by fatigue Patient left: in chair;with call bell/phone within reach;with family/visitor present Nurse Communication: Mobility status PT Visit Diagnosis: Other abnormalities of gait and mobility (R26.89);Difficulty in walking, not elsewhere classified (R26.2)     Time: 7829-5621 PT Time Calculation (min) (ACUTE ONLY): 31 min  Charges:  $Gait Training: 8-22 mins $Therapeutic Exercise: 8-22 mins                     [place]    [name] 02/25/2018, 1:14 PM

## 2018-02-25 NOTE — Progress Notes (Signed)
Physical Therapy Treatment Patient Details Name: [name] MRN: [id number] DOB: [birth year] Today's Date: 02/25/2018    History of Present Illness Pt is a 77 YO female s/p R DA-THA on 02/23/18. PMH includes cellulitis admitted to [facility] 1 month ago, DMII, neuropathy, lumbar scoliosis, PVD, osteopenia, HLD, hx of PE/DVT in 2006. Surgical history includes lumbar fusion 2018, cervical fusion 2006 with complications, R RTC repair, L DA-THA 2014.    PT Comments    Pt continues cooperative and motivated but limited this pm by c/o fatigue and increased soreness "all over".   Follow Up Recommendations  Follow surgeon's recommendation for DC plan and follow-up therapies;Supervision for mobility/OOB     Equipment Recommendations  Rolling walker with 5" wheels    Recommendations for Other Services       Precautions / Restrictions Precautions Precautions: Fall Restrictions Weight Bearing Restrictions: No Other Position/Activity Restrictions: WBAT     Mobility  Bed Mobility Overal bed mobility: Needs Assistance Bed Mobility: Sit to Supine       Sit to supine: Min assist   General bed mobility comments: cues for sequence and use of L LE to self assist  Transfers Overall transfer level: Needs assistance Equipment used: Rolling walker (2 wheeled) Transfers: Sit to/from Stand Sit to Stand: Min assist;From elevated surface         General transfer comment: cues for LE management and use of UEs to self assist  Ambulation/Gait Ambulation/Gait assistance: Min assist;Min guard Gait Distance (Feet): 111 Feet Assistive device: Rolling walker (2 wheeled) Gait Pattern/deviations: Step-to pattern;Decreased step length - right;Decreased step length - left;Shuffle;Narrow base of support Gait velocity: decr   General Gait Details: min cues for posture and position inside RW - increased time with distance ltd by pt fatigue   Stairs             Wheelchair Mobility    Modified  Rankin (Stroke Patients Only)       Balance Overall balance assessment: Mild deficits observed, not formally tested                                          Cognition Arousal/Alertness: Awake/alert Behavior During Therapy: WFL for tasks assessed/performed Overall Cognitive Status: Within Functional Limits for tasks assessed                                        Exercises      General Comments        Pertinent Vitals/Pain Pain Assessment: 0-10 Pain Score: 3  Pain Location: hurt all over - just stiff Pain Descriptors / Indicators: Aching;Sore Pain Intervention(s): Limited activity within patient's tolerance;Monitored during session;Ice applied    Home Living                      Prior Function            PT Goals (current goals can now be found in the care plan section) Acute Rehab PT Goals Patient Stated Goal: to go to rehab at [facility] and then back to Independent living PT Goal Formulation: With patient Time For Goal Achievement: 03/09/18 Potential to Achieve Goals: Good Progress towards PT goals: Progressing toward goals    Frequency    7X/week      PT Plan  Current plan remains appropriate    Co-evaluation              AM-PAC PT "6 Clicks" Daily Activity  Outcome Measure  Difficulty turning over in bed (including adjusting bedclothes, sheets and blankets)?: Unable Difficulty moving from lying on back to sitting on the side of the bed? : Unable Difficulty sitting down on and standing up from a chair with arms (e.g., wheelchair, bedside commode, etc,.)?: Unable Help needed moving to and from a bed to chair (including a wheelchair)?: A Little Help needed walking in hospital room?: A Little Help needed climbing 3-5 steps with a railing? : A Lot 6 Click Score: 11    End of Session Equipment Utilized During Treatment: Gait belt Activity Tolerance: Patient limited by fatigue Patient left: with call  bell/phone within reach;with family/visitor present;in bed Nurse Communication: Mobility status PT Visit Diagnosis: Other abnormalities of gait and mobility (R26.89);Difficulty in walking, not elsewhere classified (R26.2)     Time: 8657-8469 PT Time Calculation (min) (ACUTE ONLY): 35 min  Charges:  $Gait Training: 8-22 mins $Therapeutic Activity: 8-22 mins                     Mauro Kaufmann PT Acute Rehabilitation Services Pager [phone] Office [phone]    [name] 02/25/2018, 5:11 PM

## 2018-02-26 ENCOUNTER — Encounter (HOSPITAL_COMMUNITY): Payer: Self-pay | Admitting: Orthopaedic Surgery

## 2018-02-26 MED ORDER — METHOCARBAMOL 500 MG PO TABS: 500.0000 mg | ORAL_TABLET | Freq: Four times a day (QID) | ORAL | 0 refills | Status: DC | PRN

## 2018-02-26 MED ORDER — OXYCODONE HCL 5 MG PO TABS: 5.0000 mg | ORAL_TABLET | ORAL | 0 refills | Status: DC | PRN

## 2018-02-26 MED ORDER — ASPIRIN 81 MG PO CHEW: 81.0000 mg | CHEWABLE_TABLET | Freq: Two times a day (BID) | ORAL | 0 refills | Status: DC

## 2018-02-26 NOTE — Plan of Care (Signed)
Patient to discharge to [place]. AVS given to daughter to put in packet. Hardscript Rx given. Patient left in stable condition

## 2018-02-26 NOTE — Discharge Summary (Signed)
Patient ID: [name] MRN: [id number] DOB/AGE: July 22, 1940 77 y.o.  Admit date: 02/23/2018 Discharge date: 02/26/2018  Admission Diagnoses:  Principal Problem:   Unilateral primary osteoarthritis, right hip Active Problems:   Status post total replacement of right hip   Discharge Diagnoses:  Same  Past Medical History:  Diagnosis Date  . Anemia   . Arthritis   . Asthma    adult onset  . Basal cell cancer    LUE; Porokeratosis also  . Chronic kidney disease 1963   strep in kidney due to strep throat-hospitalized 10 days  . Complication of anesthesia    small trachea  . Diabetes mellitus 2010   A1c 6.7%  . DVT (deep venous thrombosis) ([place]) 2006   post immobilization post cns surgery  . GERD (gastroesophageal reflux disease)    very mild  . Granulomatous lung disease ([place]) 2002   incidental Xray finding  . Hyperlipidemia   . Hypertension 2004   Hypertensive response on Stress Test  . Paralysis ([place]) 2006   post cervical fusion with spinal sac tear  with hematoma   . PTE (pulmonary thromboembolism) ([place]) 2006    Surgeries: Procedure(s): RIGHT TOTAL HIP ARTHROPLASTY ANTERIOR APPROACH on 02/23/2018   Consultants:   Discharged Condition: Improved  Hospital Course: HAILE TOPPINS is an 77 y.o. female who was admitted 02/23/2018 for operative treatment ofUnilateral primary osteoarthritis, right hip. Patient has severe unremitting pain that affects sleep, daily activities, and work/hobbies. After pre-op clearance the patient was taken to the operating room on 02/23/2018 and underwent  Procedure(s): RIGHT TOTAL HIP ARTHROPLASTY ANTERIOR APPROACH.    Patient was given perioperative antibiotics:  Anti-infectives (From admission, onward)   Start     Dose/Rate Route Frequency Ordered Stop   02/23/18 1600  ceFAZolin (ANCEF) IVPB 1 g/50 mL premix     1 g 100 mL/hr over 30 Minutes Intravenous Every 6 hours 02/23/18 1408 02/23/18 2228   02/23/18 0830  ceFAZolin (ANCEF) IVPB 2g/100  mL premix     2 g 200 mL/hr over 30 Minutes Intravenous On call to O.R. 02/23/18 0037 02/23/18 1018       Patient was given sequential compression devices, early ambulation, and chemoprophylaxis to prevent DVT.  Patient benefited maximally from hospital stay and there were no complications.    Recent vital signs:  Patient Vitals for the past 24 hrs:  BP Temp Temp src Pulse Resp SpO2  02/26/18 0734 - - - - - 93 %  02/26/18 0608 126/61 99 F (37.2 C) Oral 85 17 93 %  02/25/18 2148 (!) 133/59 99.2 F (37.3 C) Oral 97 16 93 %  02/25/18 1953 - - - - - 91 %  02/25/18 1313 122/63 100.2 F (37.9 C) Oral 100 14 94 %  02/25/18 0900 - - - - - 90 %     Recent laboratory studies:  Recent Labs    02/24/18 0402  WBC 11.7*  HGB 10.2*  HCT 30.4*  PLT 230  NA 140  K 4.7  CL 108  CO2 26  BUN 15  CREATININE 0.75  GLUCOSE 153*  CALCIUM 8.9     Discharge Medications:   Allergies as of 02/26/2018      Reactions   Statins Other (See Comments)   Myalgias and muscle weakness      Medication List    TAKE these medications   acetaminophen 650 MG CR tablet Commonly known as:  TYLENOL Take 650-1,300 mg by mouth 2 (two) times  daily as needed for pain.   albuterol 108 (90 Base) MCG/ACT inhaler Commonly known as:  PROVENTIL HFA;VENTOLIN HFA Inhale 2 puffs into the lungs every 6 (six) hours as needed for wheezing or shortness of breath.   alendronate 70 MG tablet Commonly known as:  FOSAMAX TAKE 1 TABLET(70 MG) BY MOUTH EVERY 7 DAYS WITH A FULL GLASS OF WATER AND ON AN EMPTY STOMACH What changed:  See the new instructions.   ASPERCREME EX Apply 1 application topically daily as needed (back pain).   aspirin 81 MG chewable tablet Chew 1 tablet (81 mg total) by mouth 2 (two) times daily.   Azelastine-Fluticasone 137-50 MCG/ACT Susp 2 sprays each nostril bid What changed:    how much to take  how to take this  when to take this  reasons to take this  additional  instructions   CALCIUM + D PO Take 1 tablet by mouth daily.   celecoxib 200 MG capsule Commonly known as:  CELEBREX TAKE 1 CAPSULE(200 MG) BY MOUTH DAILY What changed:  See the new instructions.   cephALEXin 500 MG capsule Commonly known as:  KEFLEX Take 1 capsule (500 mg total) by mouth 4 (four) times daily.   cholecalciferol 1000 units tablet Commonly known as:  VITAMIN D Take 1,000 Units by mouth daily.   cyclobenzaprine 10 MG tablet Commonly known as:  FLEXERIL Take 1 tablet (10 mg total) by mouth 3 (three) times daily as needed for muscle spasms. What changed:    how much to take  when to take this   ezetimibe 10 MG tablet Commonly known as:  ZETIA Take 1 tablet (10 mg total) by mouth daily.   Fluticasone-Salmeterol 250-50 MCG/DOSE Aepb Commonly known as:  ADVAIR USE 1 INHALATION TWO TIMES  DAILY ; RINSE MOUTH   glucose blood test strip Check blood sugar twice daily   metFORMIN 500 MG 24 hr tablet Commonly known as:  GLUCOPHAGE-XR TAKE 1 TABLET(500 MG) BY MOUTH DAILY WITH BREAKFAST What changed:  See the new instructions.   methocarbamol 500 MG tablet Commonly known as:  ROBAXIN Take 1 tablet (500 mg total) by mouth every 6 (six) hours as needed for muscle spasms.   multivitamin with minerals Tabs tablet Take 1 tablet by mouth daily.   MYRBETRIQ 50 MG Tb24 tablet Generic drug:  mirabegron ER TAKE 1 TABLET(50 MG) BY MOUTH DAILY What changed:  See the new instructions.   OCUVITE EXTRA PO Take 1 tablet by mouth daily.   ONETOUCH DELICA LANCETS 43X Misc Check blood sugar twice daily   oxyCODONE 5 MG immediate release tablet Commonly known as:  Oxy IR/ROXICODONE Take 1-2 tablets (5-10 mg total) by mouth every 4 (four) hours as needed for moderate pain (pain score 4-6).   pregabalin 100 MG capsule Commonly known as:  LYRICA Take 1 capsule (100 mg total) by mouth 3 (three) times daily.   SYSTANE OP Place 1 drop into both eyes daily as needed (dry  eyes).       Diagnostic Studies: Dg Pelvis Portable  Result Date: 02/23/2018 CLINICAL DATA:  Status post right hip arthroplasty. EXAM: PORTABLE PELVIS 1-2 VIEWS COMPARISON:  Radiograph of December 25, 2017. FINDINGS: There is been interval placement of right hip arthroplasty. The femoral and acetabular components appear to be well situated. Expected postoperative changes are seen in the surrounding soft tissues. Previously noted left hip arthroplasty is unchanged. IMPRESSION: Interval placement of right total hip arthroplasty. Electronically Signed   By: Marijo Conception,  M.D.   On: 02/23/2018 13:54   Dg C-arm 1-60 Min-no Report  Result Date: 02/23/2018 Fluoroscopy was utilized by the requesting physician.  No radiographic interpretation.   Dg Hip Operative Unilat W Or W/o Pelvis Right  Result Date: 02/23/2018 CLINICAL DATA:  Images for right total hip replacement. Total fluoro time 23 secs EXAM: OPERATIVE RIGHT HIP (WITH PELVIS IF PERFORMED) 11 VIEWS TECHNIQUE: Fluoroscopic spot image(s) were submitted for interpretation post-operatively. COMPARISON:  12/25/2017 FINDINGS: Images are performed prior to and following placement of RIGHT total hip arthroplasty. Remote LEFT hip arthroplasty. No evidence for dislocation or interval fracture. IMPRESSION: New RIGHT hip arthroplasty. Electronically Signed   By: Nolon Nations M.D.   On: 02/23/2018 12:09    Disposition: Discharge disposition: 03-Skilled [facility]    Mcarthur Rossetti, MD. Schedule an appointment as soon as possible for a visit in 2 week(s).   Specialty:  Orthopedic Surgery Contact information: [place] [address] [phone]            Signed: Mcarthur Rossetti 02/26/2018, 7:39 AM

## 2018-02-26 NOTE — Progress Notes (Signed)
Patient ID: [name], female   DOB: November 10, 1940, 77 y.o.   MRN: [id number] No acute changes.  Feeling ok overall.  Right hip stable.  Can be discharged to [facility] today.

## 2018-02-27 ENCOUNTER — Telehealth: Payer: Self-pay

## 2018-02-27 ENCOUNTER — Non-Acute Institutional Stay (SKILLED_NURSING_FACILITY): Payer: Medicare Other | Admitting: Internal Medicine

## 2018-02-27 ENCOUNTER — Encounter: Payer: Self-pay | Admitting: Internal Medicine

## 2018-02-27 ENCOUNTER — Telehealth (INDEPENDENT_AMBULATORY_CARE_PROVIDER_SITE_OTHER): Payer: Self-pay | Admitting: Orthopaedic Surgery

## 2018-02-27 DIAGNOSIS — M85839 Other specified disorders of bone density and structure, unspecified forearm: Secondary | ICD-10-CM

## 2018-02-27 DIAGNOSIS — M4127 Other idiopathic scoliosis, lumbosacral region: Secondary | ICD-10-CM | POA: Diagnosis not present

## 2018-02-27 DIAGNOSIS — Z96641 Presence of right artificial hip joint: Secondary | ICD-10-CM | POA: Diagnosis not present

## 2018-02-27 DIAGNOSIS — M24851 Other specific joint derangements of right hip, not elsewhere classified: Secondary | ICD-10-CM | POA: Diagnosis not present

## 2018-02-27 DIAGNOSIS — Z86711 Personal history of pulmonary embolism: Secondary | ICD-10-CM

## 2018-02-27 DIAGNOSIS — E1140 Type 2 diabetes mellitus with diabetic neuropathy, unspecified: Secondary | ICD-10-CM

## 2018-02-27 DIAGNOSIS — M1611 Unilateral primary osteoarthritis, right hip: Secondary | ICD-10-CM

## 2018-02-27 DIAGNOSIS — N3281 Overactive bladder: Secondary | ICD-10-CM

## 2018-02-27 DIAGNOSIS — M1631 Unilateral osteoarthritis resulting from hip dysplasia, right hip: Secondary | ICD-10-CM | POA: Diagnosis not present

## 2018-02-27 DIAGNOSIS — M25351 Other instability, right hip: Secondary | ICD-10-CM | POA: Diagnosis not present

## 2018-02-27 DIAGNOSIS — Z471 Aftercare following joint replacement surgery: Secondary | ICD-10-CM | POA: Diagnosis not present

## 2018-02-27 DIAGNOSIS — M25551 Pain in right hip: Secondary | ICD-10-CM | POA: Diagnosis not present

## 2018-02-27 DIAGNOSIS — L03115 Cellulitis of right lower limb: Secondary | ICD-10-CM

## 2018-02-27 DIAGNOSIS — J4520 Mild intermittent asthma, uncomplicated: Secondary | ICD-10-CM

## 2018-02-27 DIAGNOSIS — M62551 Muscle wasting and atrophy, not elsewhere classified, right thigh: Secondary | ICD-10-CM | POA: Diagnosis not present

## 2018-02-27 DIAGNOSIS — M25751 Osteophyte, right hip: Secondary | ICD-10-CM | POA: Diagnosis not present

## 2018-02-27 DIAGNOSIS — G3184 Mild cognitive impairment, so stated: Secondary | ICD-10-CM | POA: Diagnosis not present

## 2018-02-27 DIAGNOSIS — R278 Other lack of coordination: Secondary | ICD-10-CM | POA: Diagnosis not present

## 2018-02-27 DIAGNOSIS — M545 Low back pain: Secondary | ICD-10-CM | POA: Diagnosis not present

## 2018-02-27 DIAGNOSIS — R2689 Other abnormalities of gait and mobility: Secondary | ICD-10-CM | POA: Diagnosis not present

## 2018-02-27 DIAGNOSIS — M4126 Other idiopathic scoliosis, lumbar region: Secondary | ICD-10-CM

## 2018-02-27 NOTE — Telephone Encounter (Signed)
Please advise 

## 2018-02-27 NOTE — Telephone Encounter (Signed)
They are questioning the Keflex and if she continues to take it Looks like we were giving her this for cellulitis and it was just filled #[phone] on 01/30/18

## 2018-02-27 NOTE — Telephone Encounter (Signed)
Beth/RN/[facility]  They need clarification on Keselex for discharge purposes.  Please call to advise [facility] @ [phone]

## 2018-02-27 NOTE — Progress Notes (Signed)
Patient ID: [name], female   DOB: May 31, 1940, 77 y.o.   MRN: [id number]  Provider:  Gwenith Spitz. Renato Gails, D.O., C.M.D. Location:  [facility] Room Number: 158 Rehab Place of Service:  SNF (31)  PCP: Donato Schultz, DO Patient Care Team: Donato Schultz, DO as PCP - General (Family Medicine) Waymon Budge, MD as Consulting Physician (Pulmonary Disease) Maeola Harman, MD as Consulting Physician (Neurosurgery) August Saucer, Corrie Mckusick, MD as Consulting Physician (Orthopedic Surgery) Kathryne Hitch, MD as Consulting Physician (Orthopedic Surgery) Arminda Resides, MD as Consulting Physician (Dermatology)  Extended Emergency Contact Information Primary Emergency Contact: Ehrhart,Kim Address: [address]          [place], [place] [address] [place] Home Phone: [phone] Relation: Daughter  Code Status: DNR--gold form completed here--pt already had but it didn't make it to her freezer capsule somehow so didn't come to rehab  Goals of Care: Advanced Directive information Advanced Directives 02/27/2018  Does Patient Have a Medical Advance Directive? Yes  Type of Estate agent of [place];Living will  Does patient want to make changes to medical advance directive? No - Patient declined  Copy of Healthcare Power of Attorney in Chart? Yes  Pre-existing out of facility DNR order (yellow form or pink MOST form) -   Chief Complaint  Patient presents with  . New Admit To SNF    Rehab admission    HPI: Patient is a 77 y.o. female with h/o anemia, asthma, CKD, osteopenia, DVT and PE postop cervical fusion in 2006 when she also had paralysis, DMII well controlled with PVD and neuropathy on metformin only, statin intolerance, overactive bladder, GERD, htn, hyperlipidemia seen today for admission to [facility] rehab on 02/26/18 after hospitalization for right total hip replacement (anterior arthoplasty) by Dr. Magnus Ivan  02/23/18 for unilateral primary hip arthritis.  She received perioperative ancef.    She came here on flexeril, tylenol, celebrex, robaxin, oxycodone and pregabalin for pain.  She is on asa 81mg  po bid chewable for 4 wks for DVT prophylaxis per orthopedics.  Preop, she was on tylenol, celebrex, flexeril and lyrica.  She's also used aspercreme.  She reports her lyrica was for some chronic thigh radicular pain.    Today, she notes she feels better than yesterday.  There's been uncertainty about her keflex that was on her d/c summary.  Pt had cellulitis back in July/august of her left leg and took keflex for it--short course bid was inadequate and it persisted and Dr. Magnus Ivan then put her on a 60 day course and it resolved.  She does report that her right thigh has been red since her surgery as best she recalls.  She was unaware she had the keflex order and tablets that her daughter had brought over to rehab.  A call is out to ortho for clarification, but, meanwhile, leg is quite erythematous, warm, tender and swollen.  The dressing was changed twice by ortho due to drainage and there is visible drainage beneath the dressing.    She had the issues with overactive bladder in the context of her left leg cellulitis before.  She was taking myrbetriq for that.  It resolved when the cellulitis did, but she's been using myrbetriq "prn".    She takes her metformin with her evening meal so we will move that.   She had been given robaxin way back by Dr. [place]ia Maxon related to her neck, but it was ineffective so we  will remove from her list.  Flexeril does help.  Jenice also notes that she has some discomfort when she moves in her hip and back.    Past Medical History:  Diagnosis Date  . Anemia   . Arthritis   . Asthma    adult onset  . Basal cell cancer    LUE; Porokeratosis also  . Chronic kidney disease 1963   strep in kidney due to strep throat-hospitalized 10 days  . Complication of anesthesia    small  trachea  . Diabetes mellitus 2010   A1c 6.7%  . DVT (deep venous thrombosis) (HCC) 2006   post immobilization post cns surgery  . GERD (gastroesophageal reflux disease)    very mild  . Granulomatous lung disease (HCC) 2002   incidental Xray finding  . Hyperlipidemia   . Hypertension 2004   Hypertensive response on Stress Test  . Paralysis (HCC) 2006   post cervical fusion with spinal sac tear  with hematoma   . PTE (pulmonary thromboembolism) (HCC) 2006   Past Surgical History:  Procedure Laterality Date  . ABDOMINAL EXPOSURE N/A 08/12/2016   Procedure: ABDOMINAL EXPOSURE;  Surgeon: Larina Earthly, MD;  Location: [facility] OR;  Service: Vascular;  Laterality: N/A;  . ANTERIOR LAT LUMBAR FUSION N/A 08/12/2016   Procedure: LUMBAR TWO-THREE, LUMBAR THREE-FOUR, LUMBAR FOUR-FIVE  ANTEROLATERAL LUMBAR INTERBODY FUSION;  Surgeon: Maeola Harman, MD;  Location[facility] OR;  Service: Neurosurgery;  Laterality: N/A;  L2-3 L3-4 L4-5 Anterolateral lumbar interbody fusion  . ANTERIOR LUMBAR FUSION N/A 08/12/2016   Procedure: Lumbar Five-Sacral One Anterior lumbar interbody fusion with Dr. Tawanna Cooler Early to assist;  Surgeon: Maeola Harman, MD;  Location: [facility] OR;  Service: Neurosurgery;  Laterality: N/A;  L5-S1 Anterior lumbar interbody fusion with Dr. Tawanna Cooler Early to assist  . BUNIONECTOMY    . CERVICAL FUSION  2006   Dr Jordan Likes, NS;post op hematoma & cns leak & urinary retention  . COLONOSCOPY  1992 & 2002   negative  . epidural steroids  2006   cervical spine  . LUMBAR PERCUTANEOUS PEDICLE SCREW 4 LEVEL N/A 08/12/2016   Procedure: LUMBAR TWO-SACRAL ONE Percuataneous Pedicle Screws;  Surgeon: Maeola Harman, MD;  Location: [facility] OR;  Service: Neurosurgery;  Laterality: N/A;  . ROTATOR CUFF REPAIR  2009   Right  . SEPTOPLASTY    . TONSILLECTOMY  77 years old  . TOTAL HIP ARTHROPLASTY  06/01/2012   Procedure: TOTAL HIP ARTHROPLASTY ANTERIOR APPROACH;  Surgeon: Kathryne Hitch, MD;  Location: [facility] ORS;  Service: Orthopedics;   Laterality: Left;  Left Total Hip Arthroplasty  . TOTAL HIP ARTHROPLASTY Right 02/23/2018   Procedure: RIGHT TOTAL HIP ARTHROPLASTY ANTERIOR APPROACH;  Surgeon: Kathryne Hitch, MD;  Location: [facility] ORS;  Service: Orthopedics;  Laterality: Right;  . TUBAL LIGATION      reports that she has never smoked. She has never used smokeless tobacco. She reports that she drinks about 1.0 standard drinks of alcohol per week. She reports that she does not use drugs. Social History   Socioeconomic History  . Marital status: Divorced    Spouse name: Not on file  . Number of children: Not on file  . Years of education: Not on file  . Highest education level: Not on file  Occupational History  . Not on file  Social Needs  . Financial resource strain: Not on file  . Food insecurity:    Worry: Not on file    Inability: Not on file  .  Transportation needs:    Medical: Not on file    Non-medical: Not on file  Tobacco Use  . Smoking status: Never Smoker  . Smokeless tobacco: Never Used  Substance and Sexual Activity  . Alcohol use: Yes    Alcohol/week: 1.0 standard drinks    Types: 1 Glasses of wine per week    Comment: socially  . Drug use: No  . Sexual activity: Never  Lifestyle  . Physical activity:    Days per week: Not on file    Minutes per session: Not on file  . Stress: Not on file  Relationships  . Social connections:    Talks on phone: Not on file    Gets together: Not on file    Attends religious service: Not on file    Active member of club or organization: Not on file    Attends meetings of clubs or organizations: Not on file    Relationship status: Not on file  . Intimate partner violence:    Fear of current or ex partner: Not on file    Emotionally abused: Not on file    Physically abused: Not on file    Forced sexual activity: Not on file  Other Topics Concern  . Not on file  Social History Narrative  . Not on file    Functional Status Survey:  normally  independent, but was using rollator walker due to her severe hip pain  Family History  Problem Relation Age of Onset  . COPD Father        emphysema  . Cancer Mother        cns cancer  . Diabetes Sister        TWIN sister ; also Fibromyalgia ; S/P stent 2004  . Heart disease Sister        stents @ 60 & 68  . Stroke Maternal Grandmother        in  late 7s  . Transient ischemic attack Paternal Aunt     Health Maintenance  Topic Date Due  . PNA vac Low Risk Adult (2 of 2 - PCV13) 09/29/2015  . OPHTHALMOLOGY EXAM  11/21/2017  . INFLUENZA VACCINE  12/28/2017  . FOOT EXAM  06/08/2018  . HEMOGLOBIN A1C  08/13/2018  . URINE MICROALBUMIN  12/12/2018  . MAMMOGRAM  01/19/2019  . TETANUS/TDAP  10/27/2020  . DEXA SCAN  Completed    Allergies  Allergen Reactions  . Statins Other (See Comments)    Myalgias and muscle weakness    Outpatient Encounter Medications as of 02/27/2018  Medication Sig  . acetaminophen (TYLENOL) 650 MG CR tablet Take 650-1,300 mg by mouth 2 (two) times daily as needed for pain.  [name] albuterol (PROAIR HFA) 108 (90 Base) MCG/ACT inhaler Inhale 2 puffs into the lungs every 6 (six) hours as needed for wheezing or shortness of breath.  [name] alendronate (FOSAMAX) 70 MG tablet TAKE 1 TABLET(70 MG) BY MOUTH EVERY 7 DAYS WITH A FULL GLASS OF WATER AND ON AN EMPTY STOMACH  . aspirin 81 MG chewable tablet Chew 1 tablet (81 mg total) by mouth 2 (two) times daily.  . Azelastine-Fluticasone (DYMISTA) 137-50 MCG/ACT SUSP Place 2 sprays into the nose 2 (two) times daily.  . Calcium Citrate-Vitamin D (CALCIUM + D PO) Take 1 tablet by mouth daily.  . celecoxib (CELEBREX) 200 MG capsule TAKE 1 CAPSULE(200 MG) BY MOUTH DAILY  . cephALEXin (KEFLEX) 500 MG capsule Take 1 capsule (500 mg total) by mouth 4 (  four) times daily.  . cholecalciferol (VITAMIN D) 1000 UNITS tablet Take 1,000 Units by mouth daily.   . cyclobenzaprine (FLEXERIL) 10 MG tablet Take 1 tablet (10 mg total) by mouth 3  (three) times daily as needed for muscle spasms.  [name] ezetimibe (ZETIA) 10 MG tablet Take 1 tablet (10 mg total) by mouth daily.  . Fluticasone-Salmeterol (ADVAIR DISKUS) 250-50 MCG/DOSE AEPB USE 1 INHALATION TWO TIMES  DAILY ; RINSE MOUTH  . glucose blood (ONETOUCH VERIO) test strip Check blood sugar twice daily  . metFORMIN (GLUCOPHAGE-XR) 500 MG 24 hr tablet TAKE 1 TABLET(500 MG) BY MOUTH DAILY WITH BREAKFAST  . methocarbamol (ROBAXIN) 500 MG tablet Take 1 tablet (500 mg total) by mouth every 6 (six) hours as needed for muscle spasms.  . Multiple Vitamin (MULTIVITAMIN WITH MINERALS) TABS tablet Take 1 tablet by mouth daily.  . Multiple Vitamins-Minerals (OCUVITE EXTRA PO) Take 1 tablet by mouth daily.  [name] MYRBETRIQ 50 MG TB24 tablet TAKE 1 TABLET(50 MG) BY MOUTH DAILY  . ONETOUCH DELICA LANCETS 33G MISC Check blood sugar twice daily  . oxyCODONE (OXY IR/ROXICODONE) 5 MG immediate release tablet Take 1-2 tablets (5-10 mg total) by mouth every 4 (four) hours as needed for moderate pain (pain score 4-6).  Bertram Gala Glycol-Propyl Glycol (SYSTANE OP) Place 1 drop into both eyes daily as needed (dry eyes).  . pregabalin (LYRICA) 100 MG capsule Take 1 capsule (100 mg total) by mouth 3 (three) times daily.  Terrance Mass Salicylate (ASPERCREME EX) Apply 1 application topically daily as needed (back pain).  . [DISCONTINUED] Azelastine-Fluticasone 137-50 MCG/ACT SUSP 2 sprays each nostril bid (Patient taking differently: No sig reported)   No facility-administered encounter medications on file as of 02/27/2018.     Review of Systems  Constitutional: Positive for activity change. Negative for appetite change, chills, fatigue and fever.  HENT: Negative for congestion, hearing loss and trouble swallowing.   Eyes: Negative for visual disturbance.  Respiratory: Negative for chest tightness and shortness of breath.   Cardiovascular: Positive for leg swelling. Negative for chest pain and palpitations.    Gastrointestinal: Positive for constipation. Negative for abdominal pain and blood in stool.  Genitourinary: Negative for dysuria.  Musculoskeletal: Positive for gait problem.  Skin:       Right thigh redness, warmth, swelling since hospitalization per pt  Neurological: Negative for dizziness and weakness.  Hematological: Does not bruise/bleed easily.  Psychiatric/Behavioral: Negative for behavioral problems and confusion. The patient is not nervous/anxious.     Vitals:   02/27/18 1051  BP: 99/61  Pulse: 91  Resp: 20  Temp: 98.3 F (36.8 C)  TempSrc: Oral  SpO2: 97%  Weight: 159 lb (72.1 kg)  Height: 5\' 3"  (1.6 m)   Body mass index is 28.17 kg/m. Physical Exam  Constitutional: She is oriented to person, place, and time. She appears well-developed and well-nourished. No distress.  HENT:  Head: Normocephalic and atraumatic.  Right Ear: External ear normal.  Left Ear: External ear normal.  Nose: Nose normal.  Mouth/Throat: Oropharynx is clear and moist.  Eyes: Pupils are equal, round, and reactive to light. Conjunctivae and EOM are normal.  glasses  Neck: Neck supple. No JVD present. No tracheal deviation present. No thyromegaly present.  Cardiovascular: Normal rate, regular rhythm, normal heart sounds and intact distal pulses.  Pulmonary/Chest: Effort normal and breath sounds normal. No respiratory distress.  Abdominal: Soft. Bowel sounds are normal. She exhibits no distension and no mass. There is no tenderness. There  is no guarding.  Neurological: She is alert and oriented to person, place, and time. No cranial nerve deficit.  Skin: Skin is warm and dry. Capillary refill takes less than 2 seconds.  Dressing with visible drainage on it (can see through it), entire right thigh is erythematous, warm, and swollen especially in medial thigh and even slightly into proximal lower leg; mildly tender  Psychiatric: She has a normal mood and affect.    Labs reviewed: Basic  Metabolic Panel: Recent Labs    12/29/17 0613  12/31/17 0537 02/12/18 1144 02/24/18 0402  NA 142   < > 141 142 140  K 4.4   < > 4.3 4.7 4.7  CL 109   < > 109 106 108  CO2 26   < > 25 28 26   GLUCOSE 139*   < > 116* 93 153*  BUN 13   < > 16 18 15   CREATININE 0.82   < > 0.71 0.84 0.75  CALCIUM 8.9   < > 9.0 9.5 8.9  MG 2.2  --   --   --   --    < > = values in this interval not displayed.   Liver Function Tests: Recent Labs    12/11/17 1005 12/28/17 1328 12/29/17 0613  AST 13 28 21   ALT 14 24 20   ALKPHOS 73 57 50  BILITOT 0.5 0.7 0.6  PROT 6.9 7.5 6.1*  ALBUMIN 4.1 4.2 3.1*   No results for input(s): LIPASE, AMYLASE in the last 8760 hours. No results for input(s): AMMONIA in the last 8760 hours. CBC: Recent Labs    12/11/17 1005 12/28/17 1328  12/31/17 0537 02/12/18 1144 02/24/18 0402  WBC 7.5 7.0   < > 6.3 7.8 11.7*  NEUTROABS 4.8 4.7  --   --   --   --   HGB 12.2 11.2*   < > 11.2* 12.1 10.2*  HCT 34.2* 34.7*   < > 34.4* 36.8 30.4*  MCV 91.5 88.5   < > 88.7 88.7 86.6  PLT 220.0 239   < > 270 267 230   < > = values in this interval not displayed.   Cardiac Enzymes: No results for input(s): CKTOTAL, CKMB, CKMBINDEX, TROPONINI in the last 8760 hours. BNP: Invalid input(s): POCBNP Lab Results  Component Value Date   HGBA1C 6.1 (H) 02/12/2018   Lab Results  Component Value Date   TSH 5.38 (H) 06/03/2016   Lab Results  Component Value Date   VITAMINB12 550 08/09/2011   Lab Results  Component Value Date   FOLATE >24.8 08/09/2011   Lab Results  Component Value Date   FERRITIN 29.0 08/09/2011    Assessment/Plan 1. Unilateral primary osteoarthritis, right hip -s/p right hip anterior arthroplasty with Dr. Magnus Ivan  2. Status post total replacement of right hip -here for PT, OT, doing well thus far; does have cellulitis of right thigh--appears she was already begun on keflex at the hospital for this, will continue for at least a 10 day course  -d/c  robaxin, keep flexeril  -cont prn oxycodone  3. Cellulitis of right lower extremity -as in #2 Notify us and Dr. Magnus Ivan if erythema not improved in right thigh by 10/3 -cbc with diff in am  4. Type 2 diabetes mellitus with diabetic neuropathy, without long-term current use of insulin (HCC) -well controlled, cont metformin, but move to evening meal  5. Mild intermittent asthma without complication -well controlled, cont same regimen  6. Osteopenia of forearm, unspecified  laterality -=cont home ca 600 with D 400 units and fosamax  7. History of pulmonary embolus (PE) - with prior surgery in 2006, on asa bid prophylaxis and teds per ortho  8. Other idiopathic scoliosis, lumbar region -notable, cont pain regimen -on lyrica for radicular pain right thigh  9.  Overactive bladder:  -change myrbetriq to prn, if never needed, d/c (per pt request for prn order which is atypical)   Family/ staff Communication: discussed with rehab nurse  Labs/tests ordered:  Cbc with diff in am  Marigold Mom L. Meryem Haertel, D.O. Geriatrics [facility] [address], [place] [address] Cell Phone (Mon-Fri 8am-5pm):  [phone] On Call:  [phone] & follow prompts after 5pm & weekends Office Phone:  [phone] Office Fax:  [phone]

## 2018-02-27 NOTE — Telephone Encounter (Signed)
She was on Keflex for her cellulitis prior to surgery on her hip.  I am fine with her being on Keflex for at least the next 1 to 2 weeks to finish out what she has been on for the cellulitis.  This will hopefully keep her from getting any infection in her hip.

## 2018-02-27 NOTE — Telephone Encounter (Signed)
Call and see what they mean

## 2018-02-27 NOTE — Telephone Encounter (Signed)
Called patient to schedule TCM hospital follow up. No answer. Advised patient is currently in [place] post hospitalization.

## 2018-02-27 NOTE — Telephone Encounter (Signed)
Beth aware of the below

## 2018-02-28 ENCOUNTER — Telehealth (INDEPENDENT_AMBULATORY_CARE_PROVIDER_SITE_OTHER): Payer: Self-pay | Admitting: Radiology

## 2018-02-28 ENCOUNTER — Ambulatory Visit (INDEPENDENT_AMBULATORY_CARE_PROVIDER_SITE_OTHER): Payer: Medicare Other | Admitting: Orthopaedic Surgery

## 2018-02-28 ENCOUNTER — Encounter (INDEPENDENT_AMBULATORY_CARE_PROVIDER_SITE_OTHER): Payer: Self-pay | Admitting: Orthopaedic Surgery

## 2018-02-28 DIAGNOSIS — R2689 Other abnormalities of gait and mobility: Secondary | ICD-10-CM | POA: Diagnosis not present

## 2018-02-28 DIAGNOSIS — M24851 Other specific joint derangements of right hip, not elsewhere classified: Secondary | ICD-10-CM | POA: Diagnosis not present

## 2018-02-28 DIAGNOSIS — M62551 Muscle wasting and atrophy, not elsewhere classified, right thigh: Secondary | ICD-10-CM | POA: Diagnosis not present

## 2018-02-28 DIAGNOSIS — M25351 Other instability, right hip: Secondary | ICD-10-CM | POA: Diagnosis not present

## 2018-02-28 DIAGNOSIS — M1611 Unilateral primary osteoarthritis, right hip: Secondary | ICD-10-CM | POA: Diagnosis not present

## 2018-02-28 DIAGNOSIS — M4127 Other idiopathic scoliosis, lumbosacral region: Secondary | ICD-10-CM | POA: Diagnosis not present

## 2018-02-28 DIAGNOSIS — Z96641 Presence of right artificial hip joint: Secondary | ICD-10-CM

## 2018-02-28 DIAGNOSIS — M25751 Osteophyte, right hip: Secondary | ICD-10-CM | POA: Diagnosis not present

## 2018-02-28 DIAGNOSIS — R278 Other lack of coordination: Secondary | ICD-10-CM | POA: Diagnosis not present

## 2018-02-28 DIAGNOSIS — G3184 Mild cognitive impairment, so stated: Secondary | ICD-10-CM | POA: Diagnosis not present

## 2018-02-28 DIAGNOSIS — M1631 Unilateral osteoarthritis resulting from hip dysplasia, right hip: Secondary | ICD-10-CM | POA: Diagnosis not present

## 2018-02-28 DIAGNOSIS — D62 Acute posthemorrhagic anemia: Secondary | ICD-10-CM | POA: Diagnosis not present

## 2018-02-28 DIAGNOSIS — Z471 Aftercare following joint replacement surgery: Secondary | ICD-10-CM | POA: Diagnosis not present

## 2018-02-28 DIAGNOSIS — M25551 Pain in right hip: Secondary | ICD-10-CM | POA: Diagnosis not present

## 2018-02-28 DIAGNOSIS — M545 Low back pain: Secondary | ICD-10-CM | POA: Diagnosis not present

## 2018-02-28 LAB — CBC AND DIFFERENTIAL
HCT: 23 — AB (ref 36–46)
Hemoglobin: 8.2 — AB (ref 12.0–16.0)
Platelets: 206 (ref 150–399)
WBC: 6.3

## 2018-02-28 NOTE — Progress Notes (Signed)
The patient is status post a right total hip arthroplasty only less than a week out.  She is had some swelling around her hip.  She is staying at a nursing care facility.  They were concerned about the swelling.  On exam her staple line is intact.  There is an area at the superior aspect of her incision that does need treatment daily with mupirocin ointment.  She does have a postoperative seroma and I was able to place an 18-gauge needle in the soft tissue and drained a significant amount of fluid from the hip area.  This is between 60 and 80 cc.  I will have her take doxycycline twice daily given history of cellulitis that she has had in the past and her lower extremities.  She will have the mupirocin ointment placed on the superior aspect of her wound daily.  I gave all these instructions to the nursing facility.  We will see her back at her regular scheduled appointment next week.  If things worsen anyway she knows to let us know and can be seen sooner.

## 2018-02-28 NOTE — Telephone Encounter (Signed)
Manuela Schwartz calls with an update on incision/postop wound.  Patient had clear drainage from the bandage and bandage was not wanting to stick proximally.  They decided they needed to look at incision and found greenish drainage inside bandage.  That was this AM.  Since lunch no visible drainage.  Gauze and tape are on incision now.  I have advised them to bring her in this PM for [place] to check incision.  Appt made

## 2018-03-01 DIAGNOSIS — M25551 Pain in right hip: Secondary | ICD-10-CM | POA: Diagnosis not present

## 2018-03-01 DIAGNOSIS — M545 Low back pain: Secondary | ICD-10-CM | POA: Diagnosis not present

## 2018-03-01 DIAGNOSIS — M62551 Muscle wasting and atrophy, not elsewhere classified, right thigh: Secondary | ICD-10-CM | POA: Diagnosis not present

## 2018-03-01 DIAGNOSIS — G3184 Mild cognitive impairment, so stated: Secondary | ICD-10-CM | POA: Diagnosis not present

## 2018-03-01 DIAGNOSIS — Z471 Aftercare following joint replacement surgery: Secondary | ICD-10-CM | POA: Diagnosis not present

## 2018-03-01 DIAGNOSIS — R278 Other lack of coordination: Secondary | ICD-10-CM | POA: Diagnosis not present

## 2018-03-01 DIAGNOSIS — M1611 Unilateral primary osteoarthritis, right hip: Secondary | ICD-10-CM | POA: Diagnosis not present

## 2018-03-01 DIAGNOSIS — M24851 Other specific joint derangements of right hip, not elsewhere classified: Secondary | ICD-10-CM | POA: Diagnosis not present

## 2018-03-01 DIAGNOSIS — R2689 Other abnormalities of gait and mobility: Secondary | ICD-10-CM | POA: Diagnosis not present

## 2018-03-01 DIAGNOSIS — M4127 Other idiopathic scoliosis, lumbosacral region: Secondary | ICD-10-CM | POA: Diagnosis not present

## 2018-03-01 DIAGNOSIS — M25751 Osteophyte, right hip: Secondary | ICD-10-CM | POA: Diagnosis not present

## 2018-03-01 DIAGNOSIS — M1631 Unilateral osteoarthritis resulting from hip dysplasia, right hip: Secondary | ICD-10-CM | POA: Diagnosis not present

## 2018-03-01 DIAGNOSIS — M25351 Other instability, right hip: Secondary | ICD-10-CM | POA: Diagnosis not present

## 2018-03-02 DIAGNOSIS — M545 Low back pain: Secondary | ICD-10-CM | POA: Diagnosis not present

## 2018-03-02 DIAGNOSIS — M25351 Other instability, right hip: Secondary | ICD-10-CM | POA: Diagnosis not present

## 2018-03-02 DIAGNOSIS — M1611 Unilateral primary osteoarthritis, right hip: Secondary | ICD-10-CM | POA: Diagnosis not present

## 2018-03-02 DIAGNOSIS — M24851 Other specific joint derangements of right hip, not elsewhere classified: Secondary | ICD-10-CM | POA: Diagnosis not present

## 2018-03-02 DIAGNOSIS — M1631 Unilateral osteoarthritis resulting from hip dysplasia, right hip: Secondary | ICD-10-CM | POA: Diagnosis not present

## 2018-03-02 DIAGNOSIS — M62551 Muscle wasting and atrophy, not elsewhere classified, right thigh: Secondary | ICD-10-CM | POA: Diagnosis not present

## 2018-03-02 DIAGNOSIS — M4127 Other idiopathic scoliosis, lumbosacral region: Secondary | ICD-10-CM | POA: Diagnosis not present

## 2018-03-02 DIAGNOSIS — R278 Other lack of coordination: Secondary | ICD-10-CM | POA: Diagnosis not present

## 2018-03-02 DIAGNOSIS — G3184 Mild cognitive impairment, so stated: Secondary | ICD-10-CM | POA: Diagnosis not present

## 2018-03-02 DIAGNOSIS — M25751 Osteophyte, right hip: Secondary | ICD-10-CM | POA: Diagnosis not present

## 2018-03-02 DIAGNOSIS — Z471 Aftercare following joint replacement surgery: Secondary | ICD-10-CM | POA: Diagnosis not present

## 2018-03-02 DIAGNOSIS — R2689 Other abnormalities of gait and mobility: Secondary | ICD-10-CM | POA: Diagnosis not present

## 2018-03-02 DIAGNOSIS — M25551 Pain in right hip: Secondary | ICD-10-CM | POA: Diagnosis not present

## 2018-03-05 DIAGNOSIS — M4127 Other idiopathic scoliosis, lumbosacral region: Secondary | ICD-10-CM | POA: Diagnosis not present

## 2018-03-05 DIAGNOSIS — R2689 Other abnormalities of gait and mobility: Secondary | ICD-10-CM | POA: Diagnosis not present

## 2018-03-05 DIAGNOSIS — M25351 Other instability, right hip: Secondary | ICD-10-CM | POA: Diagnosis not present

## 2018-03-05 DIAGNOSIS — M1631 Unilateral osteoarthritis resulting from hip dysplasia, right hip: Secondary | ICD-10-CM | POA: Diagnosis not present

## 2018-03-05 DIAGNOSIS — G3184 Mild cognitive impairment, so stated: Secondary | ICD-10-CM | POA: Diagnosis not present

## 2018-03-05 DIAGNOSIS — M25551 Pain in right hip: Secondary | ICD-10-CM | POA: Diagnosis not present

## 2018-03-05 DIAGNOSIS — M24851 Other specific joint derangements of right hip, not elsewhere classified: Secondary | ICD-10-CM | POA: Diagnosis not present

## 2018-03-05 DIAGNOSIS — M1611 Unilateral primary osteoarthritis, right hip: Secondary | ICD-10-CM | POA: Diagnosis not present

## 2018-03-05 DIAGNOSIS — M25751 Osteophyte, right hip: Secondary | ICD-10-CM | POA: Diagnosis not present

## 2018-03-05 DIAGNOSIS — M545 Low back pain: Secondary | ICD-10-CM | POA: Diagnosis not present

## 2018-03-05 DIAGNOSIS — Z471 Aftercare following joint replacement surgery: Secondary | ICD-10-CM | POA: Diagnosis not present

## 2018-03-05 DIAGNOSIS — R278 Other lack of coordination: Secondary | ICD-10-CM | POA: Diagnosis not present

## 2018-03-05 DIAGNOSIS — M62551 Muscle wasting and atrophy, not elsewhere classified, right thigh: Secondary | ICD-10-CM | POA: Diagnosis not present

## 2018-03-06 ENCOUNTER — Encounter: Payer: Self-pay | Admitting: Internal Medicine

## 2018-03-06 ENCOUNTER — Non-Acute Institutional Stay (SKILLED_NURSING_FACILITY): Payer: Medicare Other | Admitting: Internal Medicine

## 2018-03-06 ENCOUNTER — Telehealth (INDEPENDENT_AMBULATORY_CARE_PROVIDER_SITE_OTHER): Payer: Self-pay

## 2018-03-06 DIAGNOSIS — Z96641 Presence of right artificial hip joint: Secondary | ICD-10-CM

## 2018-03-06 DIAGNOSIS — T792XXA Traumatic secondary and recurrent hemorrhage and seroma, initial encounter: Secondary | ICD-10-CM

## 2018-03-06 DIAGNOSIS — G3184 Mild cognitive impairment, so stated: Secondary | ICD-10-CM | POA: Diagnosis not present

## 2018-03-06 DIAGNOSIS — M62551 Muscle wasting and atrophy, not elsewhere classified, right thigh: Secondary | ICD-10-CM | POA: Diagnosis not present

## 2018-03-06 DIAGNOSIS — L0390 Cellulitis, unspecified: Secondary | ICD-10-CM | POA: Diagnosis not present

## 2018-03-06 DIAGNOSIS — M25351 Other instability, right hip: Secondary | ICD-10-CM | POA: Diagnosis not present

## 2018-03-06 DIAGNOSIS — L03115 Cellulitis of right lower limb: Secondary | ICD-10-CM

## 2018-03-06 DIAGNOSIS — R278 Other lack of coordination: Secondary | ICD-10-CM | POA: Diagnosis not present

## 2018-03-06 DIAGNOSIS — R2689 Other abnormalities of gait and mobility: Secondary | ICD-10-CM | POA: Diagnosis not present

## 2018-03-06 DIAGNOSIS — D649 Anemia, unspecified: Secondary | ICD-10-CM | POA: Diagnosis not present

## 2018-03-06 DIAGNOSIS — E1140 Type 2 diabetes mellitus with diabetic neuropathy, unspecified: Secondary | ICD-10-CM

## 2018-03-06 DIAGNOSIS — M545 Low back pain: Secondary | ICD-10-CM | POA: Diagnosis not present

## 2018-03-06 DIAGNOSIS — M1611 Unilateral primary osteoarthritis, right hip: Secondary | ICD-10-CM | POA: Diagnosis not present

## 2018-03-06 DIAGNOSIS — Z471 Aftercare following joint replacement surgery: Secondary | ICD-10-CM | POA: Diagnosis not present

## 2018-03-06 DIAGNOSIS — M1631 Unilateral osteoarthritis resulting from hip dysplasia, right hip: Secondary | ICD-10-CM | POA: Diagnosis not present

## 2018-03-06 DIAGNOSIS — M24851 Other specific joint derangements of right hip, not elsewhere classified: Secondary | ICD-10-CM | POA: Diagnosis not present

## 2018-03-06 DIAGNOSIS — M25551 Pain in right hip: Secondary | ICD-10-CM | POA: Diagnosis not present

## 2018-03-06 DIAGNOSIS — M4127 Other idiopathic scoliosis, lumbosacral region: Secondary | ICD-10-CM | POA: Diagnosis not present

## 2018-03-06 DIAGNOSIS — M25751 Osteophyte, right hip: Secondary | ICD-10-CM | POA: Diagnosis not present

## 2018-03-06 NOTE — Progress Notes (Signed)
Patient ID: [name], female   DOB: 27-Aug-1940, 76 y.o.   MRN: [id number]  Location:  [facility] Room Number: 158 Rehab Place of Service:  SNF (31) Provider:  Niaja Stickley L. Renato Gails, D.O., C.M.D.  Donato Schultz, DO  Patient Care Team: Zola Button, Grayling Congress, DO as PCP - General (Family Medicine) Waymon Budge, MD as Consulting Physician (Pulmonary Disease) Maeola Harman, MD as Consulting Physician (Neurosurgery) August Saucer, Corrie Mckusick, MD as Consulting Physician (Orthopedic Surgery) Kathryne Hitch, MD as Consulting Physician (Orthopedic Surgery) Arminda Resides, MD as Consulting Physician (Dermatology)  Extended Emergency Contact Information Primary Emergency Contact: Ehrhart,Kim Address: [address]          [place], [place] [address] [place] Home Phone: [phone] Relation: Daughter  Code Status:  Full code Goals of care: Advanced Directive information Advanced Directives 03/06/2018  Does Patient Have a Medical Advance Directive? Yes  Type of Estate agent of [place];Living will  Does patient want to make changes to medical advance directive? No - Patient declined  Copy of Healthcare Power of Attorney in Chart? Yes  Pre-existing out of facility DNR order (yellow form or pink MOST form) -     Chief Complaint  Patient presents with  . Acute Visit    redness of right leg    HPI:  Pt is a 77 y.o. female with h/o left lower extremity cellulitis, prior PE, DVT postop from neck surgery, DMII with neuropathy seen today for an acute visit for ongoing redness, puffiness of right leg.  [name] is here in [facility] rehab s/p right anterior total hip arthroplasty on 02/23/18.  She came here on keflex therapy due to erythema and warmth of her right thigh.  This was initially continued when I saw her on 10/1. She'd had just one low grade temp and episode of tachycardia on 9/30.  She then saw Dr. Magnus Ivan 10/2 and  was noted to have a seroma at the incision site.  This was drained by Dr. Magnus Ivan and her antibiotics were changed to doxycycline 100mg  po bid beginning 10/2.  Since then, she's continued with erythema, warmth and swelling of the right thigh and has now begun to have mild erythema of her lower leg as well when the TED hose are removed.  Photos taken with haiku app and are in media.  She denies tenderness.  She has had just one more low grade temp of 99 with HR 104.  All other vital signs have been normal since 10/2.  She is also now having the increased urinary urgency and frequency at night that she had accompanying her prior cellulitis that resolved when it went away (on the left leg back in July).  WBC count was only 6.3 on 10/2 without any left shift. She otherwise feels well and has been participating in therapy.  She has been using just one oxycodone when she has pain.     Past Medical History:  Diagnosis Date  . Anemia   . Arthritis   . Asthma    adult onset  . Basal cell cancer    LUE; Porokeratosis also  . Chronic kidney disease 1963   strep in kidney due to strep throat-hospitalized 10 days  . Complication of anesthesia    small trachea  . Diabetes mellitus 2010   A1c 6.7%  . DVT (deep venous thrombosis) (HCC) 2006   post immobilization post cns surgery  . GERD (gastroesophageal reflux disease)  very mild  . Granulomatous lung disease (HCC) 2002   incidental Xray finding  . Hyperlipidemia   . Hypertension 2004   Hypertensive response on Stress Test  . Paralysis (HCC) 2006   post cervical fusion with spinal sac tear  with hematoma   . PTE (pulmonary thromboembolism) (HCC) 2006   Past Surgical History:  Procedure Laterality Date  . ABDOMINAL EXPOSURE N/A 08/12/2016   Procedure: ABDOMINAL EXPOSURE;  Surgeon: Larina Earthly, MD;  Location: [facility] OR;  Service: Vascular;  Laterality: N/A;  . ANTERIOR LAT LUMBAR FUSION N/A 08/12/2016   Procedure: LUMBAR TWO-THREE, LUMBAR THREE-FOUR,  LUMBAR FOUR-FIVE  ANTEROLATERAL LUMBAR INTERBODY FUSION;  Surgeon: Maeola Harman, MD;  Location: [facility] OR;  Service: Neurosurgery;  Laterality: N/A;  L2-3 L3-4 L4-5 Anterolateral lumbar interbody fusion  . ANTERIOR LUMBAR FUSION N/A 08/12/2016   Procedure: Lumbar Five-Sacral One Anterior lumbar interbody fusion with Dr. Tawanna Cooler Early to assist;  Surgeon: Maeola Harman, MD;  Location: [facility] OR;  Service: Neurosurgery;  Laterality: N/A;  L5-S1 Anterior lumbar interbody fusion with Dr. Tawanna Cooler Early to assist  . BUNIONECTOMY    . CERVICAL FUSION  2006   Dr Jordan Likes, NS;post op hematoma & cns leak & urinary retention  . COLONOSCOPY  1992 & 2002   negative  . epidural steroids  2006   cervical spine  . LUMBAR PERCUTANEOUS PEDICLE SCREW 4 LEVEL N/A 08/12/2016   Procedure: LUMBAR TWO-SACRAL ONE Percuataneous Pedicle Screws;  Surgeon: Maeola Harman, MD;  Location: [facility] OR;  Service: Neurosurgery;  Laterality: N/A;  . ROTATOR CUFF REPAIR  2009   Right  . SEPTOPLASTY    . TONSILLECTOMY  77 years old  . TOTAL HIP ARTHROPLASTY  06/01/2012   Procedure: TOTAL HIP ARTHROPLASTY ANTERIOR APPROACH;  Surgeon: Kathryne Hitch, MD;  Location: [facility] ORS;  Service: Orthopedics;  Laterality: Left;  Left Total Hip Arthroplasty  . TOTAL HIP ARTHROPLASTY Right 02/23/2018   Procedure: RIGHT TOTAL HIP ARTHROPLASTY ANTERIOR APPROACH;  Surgeon: Kathryne Hitch, MD;  Location: [facility] ORS;  Service: Orthopedics;  Laterality: Right;  . TUBAL LIGATION      Allergies  Allergen Reactions  . Statins Other (See Comments)    Myalgias and muscle weakness    Outpatient Encounter Medications as of 03/06/2018  Medication Sig  . acetaminophen (TYLENOL) 650 MG CR tablet Take 650-1,300 mg by mouth 2 (two) times daily as needed for pain.  [name] albuterol (PROAIR HFA) 108 (90 Base) MCG/ACT inhaler Inhale 2 puffs into the lungs every 6 (six) hours as needed for wheezing or shortness of breath.  [name] alendronate (FOSAMAX) 70 MG tablet TAKE 1 TABLET(70 MG) BY  MOUTH EVERY 7 DAYS WITH A FULL GLASS OF WATER AND ON AN EMPTY STOMACH  . aspirin 81 MG chewable tablet Chew 1 tablet (81 mg total) by mouth 2 (two) times daily.  . Azelastine-Fluticasone (DYMISTA) 137-50 MCG/ACT SUSP Place 2 sprays into the nose 2 (two) times daily.  . Calcium Citrate-Vitamin D (CALCIUM + D PO) Take 1 tablet by mouth daily.  . celecoxib (CELEBREX) 200 MG capsule TAKE 1 CAPSULE(200 MG) BY MOUTH DAILY  . cholecalciferol (VITAMIN D) 1000 UNITS tablet Take 1,000 Units by mouth daily.   . cyclobenzaprine (FLEXERIL) 10 MG tablet Take 1 tablet (10 mg total) by mouth 3 (three) times daily as needed for muscle spasms.  [name] doxycycline (VIBRAMYCIN) 100 MG capsule Take 100 mg by mouth 2 (two) times daily.  [name] ezetimibe (ZETIA) 10 MG tablet Take 1 tablet (10  mg total) by mouth daily.  . ferrous sulfate 325 (65 FE) MG tablet Take 325 mg by mouth daily with breakfast.  . Fluticasone-Salmeterol (ADVAIR DISKUS) 250-50 MCG/DOSE AEPB USE 1 INHALATION TWO TIMES  DAILY ; RINSE MOUTH  . metFORMIN (GLUCOPHAGE-XR) 500 MG 24 hr tablet TAKE 1 TABLET(500 MG) BY MOUTH DAILY WITH BREAKFAST  . Multiple Vitamin (MULTIVITAMIN WITH MINERALS) TABS tablet Take 1 tablet by mouth daily.  . Multiple Vitamins-Minerals (OCUVITE EXTRA PO) Take 1 tablet by mouth daily.  . mupirocin ointment (BACTROBAN) 2 % Place 1 application into the nose daily.  [name] MYRBETRIQ 50 MG TB24 tablet TAKE 1 TABLET(50 MG) BY MOUTH DAILY  . oxyCODONE (OXY IR/ROXICODONE) 5 MG immediate release tablet Take 1-2 tablets (5-10 mg total) by mouth every 4 (four) hours as needed for moderate pain (pain score 4-6).  Bertram Gala Glycol-Propyl Glycol (SYSTANE OP) Place 1 drop into both eyes daily as needed (dry eyes).  . polyethylene glycol (MIRALAX / GLYCOLAX) packet Take 17 g by mouth daily.  . pregabalin (LYRICA) 100 MG capsule Take 1 capsule (100 mg total) by mouth 3 (three) times daily.  Terrance Mass Salicylate (ASPERCREME EX) Apply 1 application  topically daily as needed (back pain).  . [DISCONTINUED] cephALEXin (KEFLEX) 500 MG capsule Take 1 capsule (500 mg total) by mouth 4 (four) times daily.  . [DISCONTINUED] glucose blood (ONETOUCH VERIO) test strip Check blood sugar twice daily  . [DISCONTINUED] methocarbamol (ROBAXIN) 500 MG tablet Take 1 tablet (500 mg total) by mouth every 6 (six) hours as needed for muscle spasms.  . [DISCONTINUED] ONETOUCH DELICA LANCETS 33G MISC Check blood sugar twice daily   No facility-administered encounter medications on file as of 03/06/2018.     Review of Systems  Constitutional: Negative for activity change, appetite change, chills, fatigue and fever.       One low grade temp since doxy, had one before on keflex also  Respiratory: Negative for chest tightness and shortness of breath.   Cardiovascular: Positive for leg swelling. Negative for chest pain and palpitations.  Gastrointestinal: Positive for constipation. Negative for abdominal pain, diarrhea and nausea.  Genitourinary: Positive for urgency. Negative for difficulty urinating, dysuria and frequency.  Musculoskeletal: Positive for back pain, gait problem and joint swelling. Negative for arthralgias.  Skin: Positive for color change.  Neurological: Negative for dizziness and weakness.  Hematological: Does not bruise/bleed easily.  Psychiatric/Behavioral: Negative for confusion.    Immunization History  Administered Date(s) Administered  . Influenza Split 03/15/2012  . Influenza Whole 02/27/2009  . Influenza, High Dose Seasonal PF 03/19/2013, 03/21/2016  . Influenza,inj,Quad PF,6+ Mos 03/13/2014, 02/16/2015  . Influenza-Unspecified 03/15/2012, 01/28/2014, 03/13/2014, 02/16/2015, 02/14/2017  . Pneumococcal Conjugate-13 05/16/2013  . Pneumococcal Polysaccharide-23 09/29/2014  . Tdap 10/28/2010  . Zoster 05/12/2014   Pertinent  Health Maintenance Due  Topic Date Due  . OPHTHALMOLOGY EXAM  11/21/2017  . INFLUENZA VACCINE  12/28/2017    . FOOT EXAM  06/08/2018  . HEMOGLOBIN A1C  08/13/2018  . URINE MICROALBUMIN  12/12/2018  . MAMMOGRAM  01/19/2019  . DEXA SCAN  Completed  . PNA vac Low Risk Adult  Completed   Fall Risk  12/11/2017 12/05/2016 11/24/2015 06/12/2015 06/02/2014  Falls in the past year? No No No No No  Number falls in past yr: - - - - -  Injury with Fall? - - - - -   Functional Status Survey:    Vitals:   03/06/18 1056  BP: 135/63  Pulse: 88  Resp: 18  Temp: 98.5 F (36.9 C)  TempSrc: Oral  SpO2: 96%  Weight: 159 lb (72.1 kg)  Height: 5\' 3"  (1.6 m)   Body mass index is 28.17 kg/m. Physical Exam  Constitutional: She is oriented to person, place, and time. She appears well-developed and well-nourished. No distress.  Cardiovascular: Normal rate, regular rhythm, normal heart sounds and intact distal pulses.  Pulmonary/Chest: Effort normal and breath sounds normal. No respiratory distress.  Musculoskeletal: She exhibits no tenderness.  Tense swelling of right proximal thigh; erythema of thigh is less in groin area, but more prominent in posterior thigh; small amount of serous drainage at top of incision site; right lower leg with erythema for a couple of inches above the ankle which is new  Neurological: She is alert and oriented to person, place, and time.  Skin: There is erythema.  See media photos of right hip and leg  Psychiatric: She has a normal mood and affect.    Labs reviewed: Recent Labs    12/29/17 0613  12/31/17 0537 02/12/18 1144 02/24/18 0402  NA 142   < > 141 142 140  K 4.4   < > 4.3 4.7 4.7  CL 109   < > 109 106 108  CO2 26   < > 25 28 26   GLUCOSE 139*   < > 116* 93 153*  BUN 13   < > 16 18 15   CREATININE 0.82   < > 0.71 0.84 0.75  CALCIUM 8.9   < > 9.0 9.5 8.9  MG 2.2  --   --   --   --    < > = values in this interval not displayed.   Recent Labs    12/11/17 1005 12/28/17 1328 12/29/17 0613  AST 13 28 21   ALT 14 24 20   ALKPHOS 73 57 50  BILITOT 0.5 0.7 0.6  PROT  6.9 7.5 6.1*  ALBUMIN 4.1 4.2 3.1*   Recent Labs    12/11/17 1005 12/28/17 1328  12/31/17 0537 02/12/18 1144 02/24/18 0402 02/28/18 0300  WBC 7.5 7.0   < > 6.3 7.8 11.7* 6.3  NEUTROABS 4.8 4.7  --   --   --   --   --   HGB 12.2 11.2*   < > 11.2* 12.1 10.2* 8.2*  HCT 34.2* 34.7*   < > 34.4* 36.8 30.4* 23*  MCV 91.5 88.5   < > 88.7 88.7 86.6  --   PLT 220.0 239   < > 270 267 230 206   < > = values in this interval not displayed.   Lab Results  Component Value Date   TSH 5.38 (H) 06/03/2016   Lab Results  Component Value Date   HGBA1C 6.1 (H) 02/12/2018   Lab Results  Component Value Date   CHOL 249 (H) 12/11/2017   HDL 52.80 12/11/2017   LDLCALC 134 (H) 06/08/2017   LDLDIRECT 127.0 12/11/2017   TRIG 336.0 (H) 12/11/2017   CHOLHDL 5 12/11/2017    Significant Diagnostic Results in last 30 days:  Dg Pelvis Portable  Result Date: 02/23/2018 CLINICAL DATA:  Status post right hip arthroplasty. EXAM: PORTABLE PELVIS 1-2 VIEWS COMPARISON:  Radiograph of December 25, 2017. FINDINGS: There is been interval placement of right hip arthroplasty. The femoral and acetabular components appear to be well situated. Expected postoperative changes are seen in the surrounding soft tissues. Previously noted left hip arthroplasty is unchanged. IMPRESSION: Interval placement of right total hip arthroplasty. Electronically Signed  By: Lupita Raider, M.D.   On: 02/23/2018 13:54   Dg C-arm 1-60 Min-no Report  Result Date: 02/23/2018 CLINICAL DATA:  Images for right total hip replacement. Total fluoro time 23 secs EXAM: OPERATIVE RIGHT HIP (WITH PELVIS IF PERFORMED) 11 VIEWS TECHNIQUE: Fluoroscopic spot image(s) were submitted for interpretation post-operatively. COMPARISON:  12/25/2017 FINDINGS: Images are performed prior to and following placement of RIGHT total hip arthroplasty. Remote LEFT hip arthroplasty. No evidence for dislocation or interval fracture. IMPRESSION: New RIGHT hip arthroplasty.  Electronically Signed   By: Norva Pavlov M.D.   On: 02/23/2018 12:09   Dg Hip Operative Unilat W Or W/o Pelvis Right  Result Date: 02/23/2018 CLINICAL DATA:  Images for right total hip replacement. Total fluoro time 23 secs EXAM: OPERATIVE RIGHT HIP (WITH PELVIS IF PERFORMED) 11 VIEWS TECHNIQUE: Fluoroscopic spot image(s) were submitted for interpretation post-operatively. COMPARISON:  12/25/2017 FINDINGS: Images are performed prior to and following placement of RIGHT total hip arthroplasty. Remote LEFT hip arthroplasty. No evidence for dislocation or interval fracture. IMPRESSION: New RIGHT hip arthroplasty. Electronically Signed   By: Norva Pavlov M.D.   On: 02/23/2018 12:09    Assessment/Plan 1. Cellulitis of right lower extremity -appears lower leg now involved -upper leg erythema a touch better, but seems seroma has recurred after drainage with prominent swelling of the proximal thigh, warmth remains -has had just one low grade temp on doxycycline -check cbc with diff stat today -will give a single dose of rocephin today after lab drawn here pending a follow-up with Dr. Lina Sar suggested we switch her back to keflex--it's hard for me to tell how much of the erythema of the proximal thigh is seroma-related vs cellulitis; lower leg now with cellulitis that was not there before (and similar to what left apparently looked like when she had it in July)  2. Traumatic seroma of right thigh, initial encounter (HCC) -postop -was drained on 10/2, but seems to have recurred  3. Status post total replacement of right hip -here for rehab and doing just fine with that and not complaining of increased pain which is a good sign; using just one percocet at a time not two  4. Type 2 diabetes mellitus with diabetic neuropathy, without long-term current use of insulin (HCC) -well-controlled with just metformin, but does carry an increased risk of infection  Family/ staff Communication: her  granddaughter was here during visit  Labs/tests ordered:   Orders Placed This Encounter  Procedures  . CBC and differential    This external order was created through the Results Console.  ordered for stat today   Arrian Manson L. Kacie Huxtable, D.O. Geriatrics [facility] [address], [place] [address] Cell Phone (Mon-Fri 8am-5pm):  [phone] On Call:  [phone] & follow prompts after 5pm & weekends Office Phone:  [phone] Office Fax:  [phone]

## 2018-03-06 NOTE — Telephone Encounter (Signed)
I would have them switch her back to Keflex 500 mg 4 times a day.  She should wear compressive hose on her legs such as TED hose.  I do not necessarily feel that she is been on IV antibiotics.  However I would like to see her in the office tomorrow or Thursday.

## 2018-03-06 NOTE — Telephone Encounter (Signed)
Dr Hollace Kinnier called stating that since patient saw you on 10/02 the swelling around the surgical hip incision has returned and is red. Also stated that she has had some warmth, redness and swelling in the lower legs as well.  She has had a low grade temp on 10/05. They question if antibiotic should be changed? Does she need an IV antibiotic? She said that she did take some pictures and scanned them into the media tab. Please advise [phone]

## 2018-03-06 NOTE — Telephone Encounter (Signed)
Spoke with [facility] pertaining to [name] and they would like patient to be seen tomorrow. I did provide [facility] with the information Dr.Blackman left.  Please call [phone] to discuss patient care. I'm not sure why I didn't get her name while speaking with her.

## 2018-03-07 ENCOUNTER — Encounter (INDEPENDENT_AMBULATORY_CARE_PROVIDER_SITE_OTHER): Payer: Self-pay | Admitting: Physician Assistant

## 2018-03-07 ENCOUNTER — Ambulatory Visit (INDEPENDENT_AMBULATORY_CARE_PROVIDER_SITE_OTHER): Payer: Medicare Other | Admitting: Orthopaedic Surgery

## 2018-03-07 DIAGNOSIS — M25351 Other instability, right hip: Secondary | ICD-10-CM | POA: Diagnosis not present

## 2018-03-07 DIAGNOSIS — M62551 Muscle wasting and atrophy, not elsewhere classified, right thigh: Secondary | ICD-10-CM | POA: Diagnosis not present

## 2018-03-07 DIAGNOSIS — Z471 Aftercare following joint replacement surgery: Secondary | ICD-10-CM | POA: Diagnosis not present

## 2018-03-07 DIAGNOSIS — M24851 Other specific joint derangements of right hip, not elsewhere classified: Secondary | ICD-10-CM | POA: Diagnosis not present

## 2018-03-07 DIAGNOSIS — M25751 Osteophyte, right hip: Secondary | ICD-10-CM | POA: Diagnosis not present

## 2018-03-07 DIAGNOSIS — M4127 Other idiopathic scoliosis, lumbosacral region: Secondary | ICD-10-CM | POA: Diagnosis not present

## 2018-03-07 DIAGNOSIS — R278 Other lack of coordination: Secondary | ICD-10-CM | POA: Diagnosis not present

## 2018-03-07 DIAGNOSIS — G3184 Mild cognitive impairment, so stated: Secondary | ICD-10-CM | POA: Diagnosis not present

## 2018-03-07 DIAGNOSIS — Z96641 Presence of right artificial hip joint: Secondary | ICD-10-CM

## 2018-03-07 DIAGNOSIS — M25551 Pain in right hip: Secondary | ICD-10-CM | POA: Diagnosis not present

## 2018-03-07 DIAGNOSIS — M545 Low back pain: Secondary | ICD-10-CM | POA: Diagnosis not present

## 2018-03-07 DIAGNOSIS — M1611 Unilateral primary osteoarthritis, right hip: Secondary | ICD-10-CM | POA: Diagnosis not present

## 2018-03-07 DIAGNOSIS — M1631 Unilateral osteoarthritis resulting from hip dysplasia, right hip: Secondary | ICD-10-CM | POA: Diagnosis not present

## 2018-03-07 DIAGNOSIS — R2689 Other abnormalities of gait and mobility: Secondary | ICD-10-CM | POA: Diagnosis not present

## 2018-03-07 NOTE — Telephone Encounter (Signed)
Got her an appt today

## 2018-03-07 NOTE — Progress Notes (Signed)
The patient is 12 days status post a right total hip arthroplasty.  She is been dealing with cellulitis in her legs that she had before surgery that we got to calm down.  She is developing cellulitis again on her operative side as well as swelling.  They just switch her to Keflex 4 times a day.  On exam the incision looks good so I removed staples and placed Steri-Strips.  She has a lot of pitting edema all down her right lower extremity.  I was able to aspirate over 100 cc of seroma from around her hip as well.  I will have her continue the Keflex 4 times a day and continue TED hose.  I would like to see her back on this Monday coming up is to make sure she is doing well overall.  Thus far I do not feel that she needs hospitalization or repeat surgery.  Her white blood cell count peripherally is normal at 7000.

## 2018-03-08 ENCOUNTER — Ambulatory Visit (INDEPENDENT_AMBULATORY_CARE_PROVIDER_SITE_OTHER): Payer: Medicare Other | Admitting: Orthopaedic Surgery

## 2018-03-08 DIAGNOSIS — M25351 Other instability, right hip: Secondary | ICD-10-CM | POA: Diagnosis not present

## 2018-03-08 DIAGNOSIS — M1611 Unilateral primary osteoarthritis, right hip: Secondary | ICD-10-CM | POA: Diagnosis not present

## 2018-03-08 DIAGNOSIS — R2689 Other abnormalities of gait and mobility: Secondary | ICD-10-CM | POA: Diagnosis not present

## 2018-03-08 DIAGNOSIS — G3184 Mild cognitive impairment, so stated: Secondary | ICD-10-CM | POA: Diagnosis not present

## 2018-03-08 DIAGNOSIS — M25751 Osteophyte, right hip: Secondary | ICD-10-CM | POA: Diagnosis not present

## 2018-03-08 DIAGNOSIS — M24851 Other specific joint derangements of right hip, not elsewhere classified: Secondary | ICD-10-CM | POA: Diagnosis not present

## 2018-03-08 DIAGNOSIS — M1631 Unilateral osteoarthritis resulting from hip dysplasia, right hip: Secondary | ICD-10-CM | POA: Diagnosis not present

## 2018-03-08 DIAGNOSIS — M25551 Pain in right hip: Secondary | ICD-10-CM | POA: Diagnosis not present

## 2018-03-08 DIAGNOSIS — Z471 Aftercare following joint replacement surgery: Secondary | ICD-10-CM | POA: Diagnosis not present

## 2018-03-08 DIAGNOSIS — R278 Other lack of coordination: Secondary | ICD-10-CM | POA: Diagnosis not present

## 2018-03-08 DIAGNOSIS — M62551 Muscle wasting and atrophy, not elsewhere classified, right thigh: Secondary | ICD-10-CM | POA: Diagnosis not present

## 2018-03-08 DIAGNOSIS — M4127 Other idiopathic scoliosis, lumbosacral region: Secondary | ICD-10-CM | POA: Diagnosis not present

## 2018-03-08 DIAGNOSIS — M545 Low back pain: Secondary | ICD-10-CM | POA: Diagnosis not present

## 2018-03-09 DIAGNOSIS — R2689 Other abnormalities of gait and mobility: Secondary | ICD-10-CM | POA: Diagnosis not present

## 2018-03-09 DIAGNOSIS — M24851 Other specific joint derangements of right hip, not elsewhere classified: Secondary | ICD-10-CM | POA: Diagnosis not present

## 2018-03-09 DIAGNOSIS — M25351 Other instability, right hip: Secondary | ICD-10-CM | POA: Diagnosis not present

## 2018-03-09 DIAGNOSIS — M62551 Muscle wasting and atrophy, not elsewhere classified, right thigh: Secondary | ICD-10-CM | POA: Diagnosis not present

## 2018-03-09 DIAGNOSIS — Z471 Aftercare following joint replacement surgery: Secondary | ICD-10-CM | POA: Diagnosis not present

## 2018-03-09 DIAGNOSIS — M545 Low back pain: Secondary | ICD-10-CM | POA: Diagnosis not present

## 2018-03-09 DIAGNOSIS — G3184 Mild cognitive impairment, so stated: Secondary | ICD-10-CM | POA: Diagnosis not present

## 2018-03-09 DIAGNOSIS — M1611 Unilateral primary osteoarthritis, right hip: Secondary | ICD-10-CM | POA: Diagnosis not present

## 2018-03-09 DIAGNOSIS — M25551 Pain in right hip: Secondary | ICD-10-CM | POA: Diagnosis not present

## 2018-03-09 DIAGNOSIS — R278 Other lack of coordination: Secondary | ICD-10-CM | POA: Diagnosis not present

## 2018-03-09 DIAGNOSIS — M25751 Osteophyte, right hip: Secondary | ICD-10-CM | POA: Diagnosis not present

## 2018-03-09 DIAGNOSIS — M1631 Unilateral osteoarthritis resulting from hip dysplasia, right hip: Secondary | ICD-10-CM | POA: Diagnosis not present

## 2018-03-09 DIAGNOSIS — M4127 Other idiopathic scoliosis, lumbosacral region: Secondary | ICD-10-CM | POA: Diagnosis not present

## 2018-03-12 ENCOUNTER — Encounter (INDEPENDENT_AMBULATORY_CARE_PROVIDER_SITE_OTHER): Payer: Self-pay | Admitting: Orthopaedic Surgery

## 2018-03-12 ENCOUNTER — Ambulatory Visit (INDEPENDENT_AMBULATORY_CARE_PROVIDER_SITE_OTHER): Payer: Medicare Other | Admitting: Orthopaedic Surgery

## 2018-03-12 DIAGNOSIS — Z96641 Presence of right artificial hip joint: Secondary | ICD-10-CM

## 2018-03-12 DIAGNOSIS — R2689 Other abnormalities of gait and mobility: Secondary | ICD-10-CM | POA: Diagnosis not present

## 2018-03-12 DIAGNOSIS — M4127 Other idiopathic scoliosis, lumbosacral region: Secondary | ICD-10-CM | POA: Diagnosis not present

## 2018-03-12 DIAGNOSIS — M25751 Osteophyte, right hip: Secondary | ICD-10-CM | POA: Diagnosis not present

## 2018-03-12 DIAGNOSIS — M62551 Muscle wasting and atrophy, not elsewhere classified, right thigh: Secondary | ICD-10-CM | POA: Diagnosis not present

## 2018-03-12 DIAGNOSIS — G3184 Mild cognitive impairment, so stated: Secondary | ICD-10-CM | POA: Diagnosis not present

## 2018-03-12 DIAGNOSIS — M1611 Unilateral primary osteoarthritis, right hip: Secondary | ICD-10-CM | POA: Diagnosis not present

## 2018-03-12 DIAGNOSIS — M1631 Unilateral osteoarthritis resulting from hip dysplasia, right hip: Secondary | ICD-10-CM | POA: Diagnosis not present

## 2018-03-12 DIAGNOSIS — M24851 Other specific joint derangements of right hip, not elsewhere classified: Secondary | ICD-10-CM | POA: Diagnosis not present

## 2018-03-12 DIAGNOSIS — M25351 Other instability, right hip: Secondary | ICD-10-CM | POA: Diagnosis not present

## 2018-03-12 DIAGNOSIS — M25551 Pain in right hip: Secondary | ICD-10-CM | POA: Diagnosis not present

## 2018-03-12 DIAGNOSIS — R278 Other lack of coordination: Secondary | ICD-10-CM | POA: Diagnosis not present

## 2018-03-12 DIAGNOSIS — M545 Low back pain: Secondary | ICD-10-CM | POA: Diagnosis not present

## 2018-03-12 DIAGNOSIS — Z471 Aftercare following joint replacement surgery: Secondary | ICD-10-CM | POA: Diagnosis not present

## 2018-03-12 NOTE — Progress Notes (Signed)
The patient is here status post+ a right total hip arthroplasty.  I saw her last week and drained a large seroma from her hip.  She had cellulitis as well but of her lower leg.  She is on Keflex 4 times a day.  She reports that she is doing better overall.  She gets up on exam table easily.  She has had a reaccumulation of a large seroma.  There was no evidence of infection.  I drained about 150 cc of fluid off of the proximal hip area.  I would like to see her back in just 3 days for likely repeat aspiration.  Overall though she is doing well and she will continue to increase her activities.  She will continue her TED hose as well.

## 2018-03-13 DIAGNOSIS — R278 Other lack of coordination: Secondary | ICD-10-CM | POA: Diagnosis not present

## 2018-03-13 DIAGNOSIS — M62551 Muscle wasting and atrophy, not elsewhere classified, right thigh: Secondary | ICD-10-CM | POA: Diagnosis not present

## 2018-03-13 DIAGNOSIS — M25351 Other instability, right hip: Secondary | ICD-10-CM | POA: Diagnosis not present

## 2018-03-13 DIAGNOSIS — M25751 Osteophyte, right hip: Secondary | ICD-10-CM | POA: Diagnosis not present

## 2018-03-13 DIAGNOSIS — Z471 Aftercare following joint replacement surgery: Secondary | ICD-10-CM | POA: Diagnosis not present

## 2018-03-13 DIAGNOSIS — M1611 Unilateral primary osteoarthritis, right hip: Secondary | ICD-10-CM | POA: Diagnosis not present

## 2018-03-13 DIAGNOSIS — M24851 Other specific joint derangements of right hip, not elsewhere classified: Secondary | ICD-10-CM | POA: Diagnosis not present

## 2018-03-13 DIAGNOSIS — M25551 Pain in right hip: Secondary | ICD-10-CM | POA: Diagnosis not present

## 2018-03-13 DIAGNOSIS — R2689 Other abnormalities of gait and mobility: Secondary | ICD-10-CM | POA: Diagnosis not present

## 2018-03-13 DIAGNOSIS — M1631 Unilateral osteoarthritis resulting from hip dysplasia, right hip: Secondary | ICD-10-CM | POA: Diagnosis not present

## 2018-03-13 DIAGNOSIS — M545 Low back pain: Secondary | ICD-10-CM | POA: Diagnosis not present

## 2018-03-13 DIAGNOSIS — M4127 Other idiopathic scoliosis, lumbosacral region: Secondary | ICD-10-CM | POA: Diagnosis not present

## 2018-03-13 DIAGNOSIS — G3184 Mild cognitive impairment, so stated: Secondary | ICD-10-CM | POA: Diagnosis not present

## 2018-03-14 DIAGNOSIS — M25751 Osteophyte, right hip: Secondary | ICD-10-CM | POA: Diagnosis not present

## 2018-03-14 DIAGNOSIS — M25551 Pain in right hip: Secondary | ICD-10-CM | POA: Diagnosis not present

## 2018-03-14 DIAGNOSIS — D649 Anemia, unspecified: Secondary | ICD-10-CM | POA: Diagnosis not present

## 2018-03-14 DIAGNOSIS — M24851 Other specific joint derangements of right hip, not elsewhere classified: Secondary | ICD-10-CM | POA: Diagnosis not present

## 2018-03-14 DIAGNOSIS — M1611 Unilateral primary osteoarthritis, right hip: Secondary | ICD-10-CM | POA: Diagnosis not present

## 2018-03-14 DIAGNOSIS — M4127 Other idiopathic scoliosis, lumbosacral region: Secondary | ICD-10-CM | POA: Diagnosis not present

## 2018-03-14 DIAGNOSIS — M25351 Other instability, right hip: Secondary | ICD-10-CM | POA: Diagnosis not present

## 2018-03-14 DIAGNOSIS — Z471 Aftercare following joint replacement surgery: Secondary | ICD-10-CM | POA: Diagnosis not present

## 2018-03-14 DIAGNOSIS — R278 Other lack of coordination: Secondary | ICD-10-CM | POA: Diagnosis not present

## 2018-03-14 DIAGNOSIS — R2689 Other abnormalities of gait and mobility: Secondary | ICD-10-CM | POA: Diagnosis not present

## 2018-03-14 DIAGNOSIS — M545 Low back pain: Secondary | ICD-10-CM | POA: Diagnosis not present

## 2018-03-14 DIAGNOSIS — D509 Iron deficiency anemia, unspecified: Secondary | ICD-10-CM | POA: Diagnosis not present

## 2018-03-14 DIAGNOSIS — M1631 Unilateral osteoarthritis resulting from hip dysplasia, right hip: Secondary | ICD-10-CM | POA: Diagnosis not present

## 2018-03-14 DIAGNOSIS — M62551 Muscle wasting and atrophy, not elsewhere classified, right thigh: Secondary | ICD-10-CM | POA: Diagnosis not present

## 2018-03-14 DIAGNOSIS — G3184 Mild cognitive impairment, so stated: Secondary | ICD-10-CM | POA: Diagnosis not present

## 2018-03-15 ENCOUNTER — Encounter (INDEPENDENT_AMBULATORY_CARE_PROVIDER_SITE_OTHER): Payer: Self-pay | Admitting: Orthopaedic Surgery

## 2018-03-15 ENCOUNTER — Ambulatory Visit (INDEPENDENT_AMBULATORY_CARE_PROVIDER_SITE_OTHER): Payer: Medicare Other | Admitting: Orthopaedic Surgery

## 2018-03-15 DIAGNOSIS — M1611 Unilateral primary osteoarthritis, right hip: Secondary | ICD-10-CM | POA: Diagnosis not present

## 2018-03-15 DIAGNOSIS — M545 Low back pain: Secondary | ICD-10-CM | POA: Diagnosis not present

## 2018-03-15 DIAGNOSIS — M62551 Muscle wasting and atrophy, not elsewhere classified, right thigh: Secondary | ICD-10-CM | POA: Diagnosis not present

## 2018-03-15 DIAGNOSIS — M24851 Other specific joint derangements of right hip, not elsewhere classified: Secondary | ICD-10-CM | POA: Diagnosis not present

## 2018-03-15 DIAGNOSIS — Z96641 Presence of right artificial hip joint: Secondary | ICD-10-CM

## 2018-03-15 DIAGNOSIS — M25351 Other instability, right hip: Secondary | ICD-10-CM | POA: Diagnosis not present

## 2018-03-15 DIAGNOSIS — M1631 Unilateral osteoarthritis resulting from hip dysplasia, right hip: Secondary | ICD-10-CM | POA: Diagnosis not present

## 2018-03-15 DIAGNOSIS — M4127 Other idiopathic scoliosis, lumbosacral region: Secondary | ICD-10-CM | POA: Diagnosis not present

## 2018-03-15 DIAGNOSIS — Z471 Aftercare following joint replacement surgery: Secondary | ICD-10-CM | POA: Diagnosis not present

## 2018-03-15 DIAGNOSIS — G3184 Mild cognitive impairment, so stated: Secondary | ICD-10-CM | POA: Diagnosis not present

## 2018-03-15 DIAGNOSIS — M25751 Osteophyte, right hip: Secondary | ICD-10-CM | POA: Diagnosis not present

## 2018-03-15 DIAGNOSIS — R2689 Other abnormalities of gait and mobility: Secondary | ICD-10-CM | POA: Diagnosis not present

## 2018-03-15 DIAGNOSIS — R278 Other lack of coordination: Secondary | ICD-10-CM | POA: Diagnosis not present

## 2018-03-15 DIAGNOSIS — M25551 Pain in right hip: Secondary | ICD-10-CM | POA: Diagnosis not present

## 2018-03-15 MED ORDER — CEPHALEXIN 500 MG PO CAPS: 500.0000 mg | ORAL_CAPSULE | Freq: Four times a day (QID) | ORAL | 0 refills | Status: DC

## 2018-03-15 NOTE — Progress Notes (Signed)
The patient continues to deny any hip pain at all from a hip replacement.  [name] still been coming in for serial aspirations of a seroma that he is reaccumulating.  We have had her on Keflex 4 times a day.  Overall [name] looks good.  Again [name] is not sick feeling denies any fever and chills.  [name] denies any hip pain.  On exam there is a slight pink hue to her thigh but not incision well.  There is no drainage.  There is no pain.  I did aspirate at least 80 to 90 cc of fluid off of the hip area.  This was then seroma fluid.  We will keep her on Keflex and like to see her back in 5 days for repeat aspiration.  All question concerns were answered and addressed.

## 2018-03-16 DIAGNOSIS — G3184 Mild cognitive impairment, so stated: Secondary | ICD-10-CM | POA: Diagnosis not present

## 2018-03-16 DIAGNOSIS — M24851 Other specific joint derangements of right hip, not elsewhere classified: Secondary | ICD-10-CM | POA: Diagnosis not present

## 2018-03-16 DIAGNOSIS — M25751 Osteophyte, right hip: Secondary | ICD-10-CM | POA: Diagnosis not present

## 2018-03-16 DIAGNOSIS — R278 Other lack of coordination: Secondary | ICD-10-CM | POA: Diagnosis not present

## 2018-03-16 DIAGNOSIS — M62551 Muscle wasting and atrophy, not elsewhere classified, right thigh: Secondary | ICD-10-CM | POA: Diagnosis not present

## 2018-03-16 DIAGNOSIS — M1631 Unilateral osteoarthritis resulting from hip dysplasia, right hip: Secondary | ICD-10-CM | POA: Diagnosis not present

## 2018-03-16 DIAGNOSIS — M25551 Pain in right hip: Secondary | ICD-10-CM | POA: Diagnosis not present

## 2018-03-16 DIAGNOSIS — Z471 Aftercare following joint replacement surgery: Secondary | ICD-10-CM | POA: Diagnosis not present

## 2018-03-16 DIAGNOSIS — M4127 Other idiopathic scoliosis, lumbosacral region: Secondary | ICD-10-CM | POA: Diagnosis not present

## 2018-03-16 DIAGNOSIS — M1611 Unilateral primary osteoarthritis, right hip: Secondary | ICD-10-CM | POA: Diagnosis not present

## 2018-03-16 DIAGNOSIS — M25351 Other instability, right hip: Secondary | ICD-10-CM | POA: Diagnosis not present

## 2018-03-16 DIAGNOSIS — R2689 Other abnormalities of gait and mobility: Secondary | ICD-10-CM | POA: Diagnosis not present

## 2018-03-16 DIAGNOSIS — M545 Low back pain: Secondary | ICD-10-CM | POA: Diagnosis not present

## 2018-03-19 DIAGNOSIS — M24851 Other specific joint derangements of right hip, not elsewhere classified: Secondary | ICD-10-CM | POA: Diagnosis not present

## 2018-03-19 DIAGNOSIS — R278 Other lack of coordination: Secondary | ICD-10-CM | POA: Diagnosis not present

## 2018-03-19 DIAGNOSIS — M25751 Osteophyte, right hip: Secondary | ICD-10-CM | POA: Diagnosis not present

## 2018-03-19 DIAGNOSIS — M1611 Unilateral primary osteoarthritis, right hip: Secondary | ICD-10-CM | POA: Diagnosis not present

## 2018-03-19 DIAGNOSIS — M25551 Pain in right hip: Secondary | ICD-10-CM | POA: Diagnosis not present

## 2018-03-19 DIAGNOSIS — R2689 Other abnormalities of gait and mobility: Secondary | ICD-10-CM | POA: Diagnosis not present

## 2018-03-19 DIAGNOSIS — M62551 Muscle wasting and atrophy, not elsewhere classified, right thigh: Secondary | ICD-10-CM | POA: Diagnosis not present

## 2018-03-19 DIAGNOSIS — M545 Low back pain: Secondary | ICD-10-CM | POA: Diagnosis not present

## 2018-03-19 DIAGNOSIS — M4127 Other idiopathic scoliosis, lumbosacral region: Secondary | ICD-10-CM | POA: Diagnosis not present

## 2018-03-19 DIAGNOSIS — G3184 Mild cognitive impairment, so stated: Secondary | ICD-10-CM | POA: Diagnosis not present

## 2018-03-19 DIAGNOSIS — Z471 Aftercare following joint replacement surgery: Secondary | ICD-10-CM | POA: Diagnosis not present

## 2018-03-19 DIAGNOSIS — M25351 Other instability, right hip: Secondary | ICD-10-CM | POA: Diagnosis not present

## 2018-03-19 DIAGNOSIS — M1631 Unilateral osteoarthritis resulting from hip dysplasia, right hip: Secondary | ICD-10-CM | POA: Diagnosis not present

## 2018-03-20 ENCOUNTER — Encounter (INDEPENDENT_AMBULATORY_CARE_PROVIDER_SITE_OTHER): Payer: Self-pay | Admitting: Orthopaedic Surgery

## 2018-03-20 ENCOUNTER — Ambulatory Visit (INDEPENDENT_AMBULATORY_CARE_PROVIDER_SITE_OTHER): Payer: Medicare Other | Admitting: Orthopaedic Surgery

## 2018-03-20 DIAGNOSIS — Z96641 Presence of right artificial hip joint: Secondary | ICD-10-CM

## 2018-03-20 NOTE — Progress Notes (Signed)
The patient continues to improve each day.  She has much less hip pain in general 25 days status post a right total hip arthroplasty.  We have been seeing her back though due to the need for serial aspirations of her recurrent seroma.  There is been no evidence of infection at all.  She still on Keflex due to cellulitis in her leg.  On exam today there is no evidence of cellulitis in her right or left legs and her right hip is not red at all.  The incision is healed over nicely.  There is still a seroma and I was able to aspirate 80 cc of fluid from the proximal femur area.  Again there is no redness or swelling or drainage.  She feels great overall.  I can put her right hip to internal extra rotation with no pain.  I would like to see her back in 1 week to consider repeat aspiration.  Otherwise she will continue increase her activities as she tolerates and will continue her Keflex.  All question concerns were answered and addressed.

## 2018-03-21 DIAGNOSIS — M545 Low back pain: Secondary | ICD-10-CM | POA: Diagnosis not present

## 2018-03-21 DIAGNOSIS — M62551 Muscle wasting and atrophy, not elsewhere classified, right thigh: Secondary | ICD-10-CM | POA: Diagnosis not present

## 2018-03-21 DIAGNOSIS — R278 Other lack of coordination: Secondary | ICD-10-CM | POA: Diagnosis not present

## 2018-03-21 DIAGNOSIS — M1631 Unilateral osteoarthritis resulting from hip dysplasia, right hip: Secondary | ICD-10-CM | POA: Diagnosis not present

## 2018-03-21 DIAGNOSIS — M25351 Other instability, right hip: Secondary | ICD-10-CM | POA: Diagnosis not present

## 2018-03-21 DIAGNOSIS — M4127 Other idiopathic scoliosis, lumbosacral region: Secondary | ICD-10-CM | POA: Diagnosis not present

## 2018-03-21 DIAGNOSIS — M25751 Osteophyte, right hip: Secondary | ICD-10-CM | POA: Diagnosis not present

## 2018-03-21 DIAGNOSIS — Z471 Aftercare following joint replacement surgery: Secondary | ICD-10-CM | POA: Diagnosis not present

## 2018-03-21 DIAGNOSIS — M1611 Unilateral primary osteoarthritis, right hip: Secondary | ICD-10-CM | POA: Diagnosis not present

## 2018-03-21 DIAGNOSIS — G3184 Mild cognitive impairment, so stated: Secondary | ICD-10-CM | POA: Diagnosis not present

## 2018-03-21 DIAGNOSIS — R2689 Other abnormalities of gait and mobility: Secondary | ICD-10-CM | POA: Diagnosis not present

## 2018-03-21 DIAGNOSIS — M25551 Pain in right hip: Secondary | ICD-10-CM | POA: Diagnosis not present

## 2018-03-21 DIAGNOSIS — M24851 Other specific joint derangements of right hip, not elsewhere classified: Secondary | ICD-10-CM | POA: Diagnosis not present

## 2018-03-22 DIAGNOSIS — M4127 Other idiopathic scoliosis, lumbosacral region: Secondary | ICD-10-CM | POA: Diagnosis not present

## 2018-03-22 DIAGNOSIS — M1631 Unilateral osteoarthritis resulting from hip dysplasia, right hip: Secondary | ICD-10-CM | POA: Diagnosis not present

## 2018-03-22 DIAGNOSIS — Z471 Aftercare following joint replacement surgery: Secondary | ICD-10-CM | POA: Diagnosis not present

## 2018-03-22 DIAGNOSIS — G3184 Mild cognitive impairment, so stated: Secondary | ICD-10-CM | POA: Diagnosis not present

## 2018-03-22 DIAGNOSIS — M1611 Unilateral primary osteoarthritis, right hip: Secondary | ICD-10-CM | POA: Diagnosis not present

## 2018-03-22 DIAGNOSIS — M25551 Pain in right hip: Secondary | ICD-10-CM | POA: Diagnosis not present

## 2018-03-22 DIAGNOSIS — M24851 Other specific joint derangements of right hip, not elsewhere classified: Secondary | ICD-10-CM | POA: Diagnosis not present

## 2018-03-22 DIAGNOSIS — M25751 Osteophyte, right hip: Secondary | ICD-10-CM | POA: Diagnosis not present

## 2018-03-22 DIAGNOSIS — M545 Low back pain: Secondary | ICD-10-CM | POA: Diagnosis not present

## 2018-03-22 DIAGNOSIS — R2689 Other abnormalities of gait and mobility: Secondary | ICD-10-CM | POA: Diagnosis not present

## 2018-03-22 DIAGNOSIS — M62551 Muscle wasting and atrophy, not elsewhere classified, right thigh: Secondary | ICD-10-CM | POA: Diagnosis not present

## 2018-03-22 DIAGNOSIS — M25351 Other instability, right hip: Secondary | ICD-10-CM | POA: Diagnosis not present

## 2018-03-22 DIAGNOSIS — R278 Other lack of coordination: Secondary | ICD-10-CM | POA: Diagnosis not present

## 2018-03-23 DIAGNOSIS — M545 Low back pain: Secondary | ICD-10-CM | POA: Diagnosis not present

## 2018-03-23 DIAGNOSIS — G3184 Mild cognitive impairment, so stated: Secondary | ICD-10-CM | POA: Diagnosis not present

## 2018-03-23 DIAGNOSIS — R278 Other lack of coordination: Secondary | ICD-10-CM | POA: Diagnosis not present

## 2018-03-23 DIAGNOSIS — R2689 Other abnormalities of gait and mobility: Secondary | ICD-10-CM | POA: Diagnosis not present

## 2018-03-23 DIAGNOSIS — M1611 Unilateral primary osteoarthritis, right hip: Secondary | ICD-10-CM | POA: Diagnosis not present

## 2018-03-23 DIAGNOSIS — M4127 Other idiopathic scoliosis, lumbosacral region: Secondary | ICD-10-CM | POA: Diagnosis not present

## 2018-03-23 DIAGNOSIS — Z471 Aftercare following joint replacement surgery: Secondary | ICD-10-CM | POA: Diagnosis not present

## 2018-03-23 DIAGNOSIS — M24851 Other specific joint derangements of right hip, not elsewhere classified: Secondary | ICD-10-CM | POA: Diagnosis not present

## 2018-03-23 DIAGNOSIS — M25751 Osteophyte, right hip: Secondary | ICD-10-CM | POA: Diagnosis not present

## 2018-03-23 DIAGNOSIS — M25351 Other instability, right hip: Secondary | ICD-10-CM | POA: Diagnosis not present

## 2018-03-23 DIAGNOSIS — M62551 Muscle wasting and atrophy, not elsewhere classified, right thigh: Secondary | ICD-10-CM | POA: Diagnosis not present

## 2018-03-23 DIAGNOSIS — M25551 Pain in right hip: Secondary | ICD-10-CM | POA: Diagnosis not present

## 2018-03-23 DIAGNOSIS — M1631 Unilateral osteoarthritis resulting from hip dysplasia, right hip: Secondary | ICD-10-CM | POA: Diagnosis not present

## 2018-03-27 ENCOUNTER — Encounter (INDEPENDENT_AMBULATORY_CARE_PROVIDER_SITE_OTHER): Payer: Self-pay | Admitting: Orthopaedic Surgery

## 2018-03-27 ENCOUNTER — Ambulatory Visit (INDEPENDENT_AMBULATORY_CARE_PROVIDER_SITE_OTHER): Payer: Medicare Other | Admitting: Orthopaedic Surgery

## 2018-03-27 DIAGNOSIS — M62551 Muscle wasting and atrophy, not elsewhere classified, right thigh: Secondary | ICD-10-CM | POA: Diagnosis not present

## 2018-03-27 DIAGNOSIS — M1631 Unilateral osteoarthritis resulting from hip dysplasia, right hip: Secondary | ICD-10-CM | POA: Diagnosis not present

## 2018-03-27 DIAGNOSIS — M545 Low back pain: Secondary | ICD-10-CM | POA: Diagnosis not present

## 2018-03-27 DIAGNOSIS — M25751 Osteophyte, right hip: Secondary | ICD-10-CM | POA: Diagnosis not present

## 2018-03-27 DIAGNOSIS — M25351 Other instability, right hip: Secondary | ICD-10-CM | POA: Diagnosis not present

## 2018-03-27 DIAGNOSIS — M1611 Unilateral primary osteoarthritis, right hip: Secondary | ICD-10-CM | POA: Diagnosis not present

## 2018-03-27 DIAGNOSIS — Z96641 Presence of right artificial hip joint: Secondary | ICD-10-CM

## 2018-03-27 DIAGNOSIS — M4127 Other idiopathic scoliosis, lumbosacral region: Secondary | ICD-10-CM | POA: Diagnosis not present

## 2018-03-27 DIAGNOSIS — R278 Other lack of coordination: Secondary | ICD-10-CM | POA: Diagnosis not present

## 2018-03-27 DIAGNOSIS — M24851 Other specific joint derangements of right hip, not elsewhere classified: Secondary | ICD-10-CM | POA: Diagnosis not present

## 2018-03-27 DIAGNOSIS — G3184 Mild cognitive impairment, so stated: Secondary | ICD-10-CM | POA: Diagnosis not present

## 2018-03-27 DIAGNOSIS — M25551 Pain in right hip: Secondary | ICD-10-CM | POA: Diagnosis not present

## 2018-03-27 DIAGNOSIS — R2689 Other abnormalities of gait and mobility: Secondary | ICD-10-CM | POA: Diagnosis not present

## 2018-03-27 DIAGNOSIS — Z471 Aftercare following joint replacement surgery: Secondary | ICD-10-CM | POA: Diagnosis not present

## 2018-03-27 NOTE — Progress Notes (Signed)
The patient is now 1 month post a right total hip arthroplasty.  I been seeing her back each week to drain a seroma from the hip.  She feels much better overall and is ready to drive.  She denies any pain.  On exam there is certainly a recommendation of seroma.  I aspirated 80 cc of fluid from the surrounding tissue with no evidence of infection at all.  There is no redness.  I would like to continue to do this as long as she is accumulating fluid given her history of cellulitis of her legs.  She is feeling well overall and she can drive from my standpoint she does not appear sick appearing and has had no fever chills.  We will see her back in a week.  All question concerns were answered and addressed.

## 2018-03-29 ENCOUNTER — Inpatient Hospital Stay: Payer: Medicare Other | Admitting: Family Medicine

## 2018-03-30 ENCOUNTER — Ambulatory Visit (INDEPENDENT_AMBULATORY_CARE_PROVIDER_SITE_OTHER): Payer: Medicare Other | Admitting: Family Medicine

## 2018-03-30 ENCOUNTER — Telehealth: Payer: Self-pay | Admitting: *Deleted

## 2018-03-30 ENCOUNTER — Encounter: Payer: Self-pay | Admitting: Family Medicine

## 2018-03-30 VITALS — BP 128/68 | HR 72 | Temp 97.6°F | Resp 16 | Ht 63.0 in | Wt 156.0 lb

## 2018-03-30 DIAGNOSIS — M545 Low back pain, unspecified: Secondary | ICD-10-CM

## 2018-03-30 DIAGNOSIS — M62551 Muscle wasting and atrophy, not elsewhere classified, right thigh: Secondary | ICD-10-CM | POA: Diagnosis not present

## 2018-03-30 DIAGNOSIS — M1611 Unilateral primary osteoarthritis, right hip: Secondary | ICD-10-CM | POA: Diagnosis not present

## 2018-03-30 DIAGNOSIS — E1169 Type 2 diabetes mellitus with other specified complication: Secondary | ICD-10-CM | POA: Diagnosis not present

## 2018-03-30 DIAGNOSIS — R278 Other lack of coordination: Secondary | ICD-10-CM | POA: Diagnosis not present

## 2018-03-30 DIAGNOSIS — D509 Iron deficiency anemia, unspecified: Secondary | ICD-10-CM | POA: Diagnosis not present

## 2018-03-30 DIAGNOSIS — Z96641 Presence of right artificial hip joint: Secondary | ICD-10-CM | POA: Diagnosis not present

## 2018-03-30 DIAGNOSIS — R2689 Other abnormalities of gait and mobility: Secondary | ICD-10-CM | POA: Diagnosis not present

## 2018-03-30 DIAGNOSIS — E1140 Type 2 diabetes mellitus with diabetic neuropathy, unspecified: Secondary | ICD-10-CM | POA: Diagnosis not present

## 2018-03-30 DIAGNOSIS — E785 Hyperlipidemia, unspecified: Secondary | ICD-10-CM

## 2018-03-30 DIAGNOSIS — E782 Mixed hyperlipidemia: Secondary | ICD-10-CM

## 2018-03-30 DIAGNOSIS — Z471 Aftercare following joint replacement surgery: Secondary | ICD-10-CM | POA: Diagnosis not present

## 2018-03-30 DIAGNOSIS — M25551 Pain in right hip: Secondary | ICD-10-CM | POA: Diagnosis not present

## 2018-03-30 LAB — CBC WITH DIFFERENTIAL/PLATELET
BASOS ABS: 0.1 10*3/uL (ref 0.0–0.1)
Basophils Relative: 0.6 % (ref 0.0–3.0)
EOS ABS: 0.1 10*3/uL (ref 0.0–0.7)
Eosinophils Relative: 1.7 % (ref 0.0–5.0)
HCT: 32.5 % — ABNORMAL LOW (ref 36.0–46.0)
Hemoglobin: 10.7 g/dL — ABNORMAL LOW (ref 12.0–15.0)
LYMPHS ABS: 2.5 10*3/uL (ref 0.7–4.0)
Lymphocytes Relative: 29 % (ref 12.0–46.0)
MCHC: 33 g/dL (ref 30.0–36.0)
MCV: 85.4 fl (ref 78.0–100.0)
MONO ABS: 0.8 10*3/uL (ref 0.1–1.0)
Monocytes Relative: 8.9 % (ref 3.0–12.0)
NEUTROS ABS: 5.1 10*3/uL (ref 1.4–7.7)
NEUTROS PCT: 59.8 % (ref 43.0–77.0)
PLATELETS: 277 10*3/uL (ref 150.0–400.0)
RBC: 3.81 Mil/uL — AB (ref 3.87–5.11)
RDW: 15.7 % — ABNORMAL HIGH (ref 11.5–15.5)
WBC: 8.5 10*3/uL (ref 4.0–10.5)

## 2018-03-30 MED ORDER — EZETIMIBE 10 MG PO TABS: 10.0000 mg | ORAL_TABLET | Freq: Every day | ORAL | 1 refills | Status: DC

## 2018-03-30 MED ORDER — FERROUS SULFATE 325 (65 FE) MG PO TABS: 325.0000 mg | ORAL_TABLET | Freq: Every day | ORAL | 1 refills | Status: DC

## 2018-03-30 MED ORDER — CYCLOBENZAPRINE HCL 10 MG PO TABS: 10.0000 mg | ORAL_TABLET | Freq: Three times a day (TID) | ORAL | 0 refills | Status: DC | PRN

## 2018-03-30 NOTE — Assessment & Plan Note (Addendum)
Encouraged heart healthy diet, increase exercise, avoid trans fats, consider a krill oil cap daily 

## 2018-03-30 NOTE — Telephone Encounter (Signed)
Received Physician Orders from [place] via [facility]; forwarded to provider/SLS 11/01

## 2018-03-30 NOTE — Progress Notes (Signed)
Patient ID: [name], female    DOB: 1941/04/10  Age: 77 y.o. MRN: [id number]    Subjective:  Subjective  HPI [name] presents for f/u surgery / rehab and f/u dm, chol and bp HYPERTENSION   Blood pressure range-not checking   Chest pain- no      Dyspnea- no Lightheadedness- no   Edema- no  Other side effects - no   Medication compliance: good Low salt diet- yes    DIABETES    Blood Sugar ranges-good per pt  Polyuria- no New Visual problems- no  Hypoglycemic symptoms- no  Other side effects-no Medication compliance - good Last eye exam- due Foot exam- today   HYPERLIPIDEMIA  Medication compliance- good RUQ pain- no  Muscle aches- no Other side effects-no      Review of Systems  Constitutional: Negative for appetite change, diaphoresis, fatigue and unexpected weight change.  Eyes: Negative for pain, redness and visual disturbance.  Respiratory: Negative for cough, chest tightness, shortness of breath and wheezing.   Cardiovascular: Negative for chest pain, palpitations and leg swelling.  Endocrine: Negative for cold intolerance, heat intolerance, polydipsia, polyphagia and polyuria.  Genitourinary: Negative for difficulty urinating, dysuria and frequency.  Neurological: Negative for dizziness, light-headedness, numbness and headaches.    History Past Medical History:  Diagnosis Date  . Anemia   . Arthritis   . Asthma    adult onset  . Basal cell cancer    LUE; Porokeratosis also  . Chronic kidney disease 1963   strep in kidney due to strep throat-hospitalized 10 days  . Complication of anesthesia    small trachea  . Diabetes mellitus 2010   A1c 6.7%  . DVT (deep venous thrombosis) ([place]) 2006   post immobilization post cns surgery  . GERD (gastroesophageal reflux disease)    very mild  . Granulomatous lung disease ([place]) 2002   incidental Xray finding  . Hyperlipidemia   . Hypertension 2004   Hypertensive response on Stress Test  . Paralysis ([place])  2006   post cervical fusion with spinal sac tear  with hematoma   . PTE (pulmonary thromboembolism) ([place]) 2006    She has a past surgical history that includes Bunionectomy; Septoplasty; Tubal ligation; Colonoscopy (1992 & 2002); Rotator cuff repair (2009); epidural steroids (2006); Cervical fusion (2006); Tonsillectomy (77 years old); Total hip arthroplasty (06/01/2012); Anterior lumbar fusion (N/A, 08/12/2016); Anterior lat lumbar fusion (N/A, 08/12/2016); Lumbar percutaneous pedicle screw 4 level (N/A, 08/12/2016); Abdominal exposure (N/A, 08/12/2016); and Total hip arthroplasty (Right, 02/23/2018).   Her family history includes COPD in her father; Cancer in her mother; Diabetes in her sister; Heart disease in her sister; Stroke in her maternal grandmother; Transient ischemic attack in her paternal aunt.She reports that she has never smoked. She has never used smokeless tobacco. She reports that she drinks about 1.0 standard drinks of alcohol per week. She reports that she does not use drugs.  Current Outpatient Medications on File Prior to Visit  Medication Sig Dispense Refill  . acetaminophen (TYLENOL) 650 MG CR tablet Take 650-1,300 mg by mouth 2 (two) times daily as needed for pain.    [name] albuterol (PROAIR HFA) 108 (90 Base) MCG/ACT inhaler Inhale 2 puffs into the lungs every 6 (six) hours as needed for wheezing or shortness of breath. 3 Inhaler 3  . alendronate (FOSAMAX) 70 MG tablet TAKE 1 TABLET(70 MG) BY MOUTH EVERY 7 DAYS WITH A FULL GLASS OF WATER AND ON AN EMPTY STOMACH 12 tablet  1  . aspirin 81 MG chewable tablet Chew 1 tablet (81 mg total) by mouth 2 (two) times daily. 30 tablet 0  . Azelastine-Fluticasone (DYMISTA) 137-50 MCG/ACT SUSP Place 2 sprays into the nose 2 (two) times daily.    . Calcium Citrate-Vitamin D (CALCIUM + D PO) Take 1 tablet by mouth daily.    . celecoxib (CELEBREX) 200 MG capsule TAKE 1 CAPSULE(200 MG) BY MOUTH DAILY 90 capsule 0  . cephALEXin (KEFLEX) 500 MG capsule  Take 1 capsule (500 mg total) by mouth 4 (four) times daily. 40 capsule 0  . cholecalciferol (VITAMIN D) 1000 UNITS tablet Take 1,000 Units by mouth daily.     . Fluticasone-Salmeterol (ADVAIR DISKUS) 250-50 MCG/DOSE AEPB USE 1 INHALATION TWO TIMES  DAILY ; RINSE MOUTH 180 each 3  . metFORMIN (GLUCOPHAGE-XR) 500 MG 24 hr tablet TAKE 1 TABLET(500 MG) BY MOUTH DAILY WITH BREAKFAST 90 tablet 1  . Multiple Vitamin (MULTIVITAMIN WITH MINERALS) TABS tablet Take 1 tablet by mouth daily.    . Multiple Vitamins-Minerals (OCUVITE EXTRA PO) Take 1 tablet by mouth daily.    . mupirocin ointment (BACTROBAN) 2 % Place 1 application into the nose daily.    [name] MYRBETRIQ 50 MG TB24 tablet TAKE 1 TABLET(50 MG) BY MOUTH DAILY 30 tablet 5  . Polyethyl Glycol-Propyl Glycol (SYSTANE OP) Place 1 drop into both eyes daily as needed (dry eyes).    . polyethylene glycol (MIRALAX / GLYCOLAX) packet Take 17 g by mouth daily.    . pregabalin (LYRICA) 100 MG capsule Take 1 capsule (100 mg total) by mouth 3 (three) times daily. 270 capsule 1  . Trolamine Salicylate (ASPERCREME EX) Apply 1 application topically daily as needed (back pain).     No current facility-administered medications on file prior to visit.      Objective:  Objective  Physical Exam  Constitutional: She is oriented to person, place, and time. She appears well-developed and well-nourished.  HENT:  Head: Normocephalic and atraumatic.  Eyes: Conjunctivae and EOM are normal.  Neck: Normal range of motion. Neck supple. No JVD present. Carotid bruit is not present. No thyromegaly present.  Cardiovascular: Normal rate, regular rhythm and normal heart sounds.  No murmur heard. Pulmonary/Chest: Effort normal and breath sounds normal. No respiratory distress. She has no wheezes. She has no rales. She exhibits no tenderness.  Musculoskeletal: She exhibits no edema.  Neurological: She is alert and oriented to person, place, and time.  Skin:     Psychiatric:  She has a normal mood and affect.  Nursing note and vitals reviewed.  BP 128/68 (BP Location: Left Arm, Patient Position: Sitting, Cuff Size: Normal)   Pulse 72   Temp 97.6 F (36.4 C) (Oral)   Resp 16   Ht 5\' 3"  (1.6 m)   Wt 156 lb (70.8 kg)   SpO2 99%   BMI 27.63 kg/m  Wt Readings from Last 3 Encounters:  03/30/18 156 lb (70.8 kg)  03/06/18 159 lb (72.1 kg)  02/27/18 159 lb (72.1 kg)     Lab Results  Component Value Date   WBC 6.3 02/28/2018   HGB 8.2 (A) 02/28/2018   HCT 23 (A) 02/28/2018   PLT 206 02/28/2018   GLUCOSE 153 (H) 02/24/2018   CHOL 249 (H) 12/11/2017   TRIG 336.0 (H) 12/11/2017   HDL 52.80 12/11/2017   LDLDIRECT 127.0 12/11/2017   LDLCALC 134 (H) 06/08/2017   ALT 20 12/29/2017   AST 21 12/29/2017   NA 140  02/24/2018   K 4.7 02/24/2018   CL 108 02/24/2018   CREATININE 0.75 02/24/2018   BUN 15 02/24/2018   CO2 26 02/24/2018   TSH 5.38 (H) 06/03/2016   INR 0.96 05/28/2012   HGBA1C 6.1 (H) 02/12/2018   MICROALBUR <0.7 12/11/2017    No results found.   Assessment & Plan:  Plan  I have discontinued Zigmund Daniel E. Maclay's oxyCODONE and doxycycline. I have also changed her ferrous sulfate. Additionally, I am having her maintain her cholecalciferol, albuterol, Multiple Vitamins-Minerals (OCUVITE EXTRA PO), multivitamin with minerals, Fluticasone-Salmeterol, celecoxib, Trolamine Salicylate (ASPERCREME EX), Polyethyl Glycol-Propyl Glycol (SYSTANE OP), pregabalin, alendronate, metFORMIN, MYRBETRIQ, Calcium Citrate-Vitamin D (CALCIUM + D PO), acetaminophen, aspirin, Azelastine-Fluticasone, polyethylene glycol, mupirocin ointment, cephALEXin, cyclobenzaprine, and ezetimibe.  Meds ordered this encounter  Medications  . cyclobenzaprine (FLEXERIL) 10 MG tablet    Sig: Take 1 tablet (10 mg total) by mouth 3 (three) times daily as needed for muscle spasms.    Dispense:  30 tablet    Refill:  0  . ferrous sulfate 325 (65 FE) MG tablet    Sig: Take 1 tablet (325 mg  total) by mouth daily with breakfast.    Dispense:  90 tablet    Refill:  1  . ezetimibe (ZETIA) 10 MG tablet    Sig: Take 1 tablet (10 mg total) by mouth daily.    Dispense:  90 tablet    Refill:  1    Problem List Items Addressed This Visit      Unprioritized   Anemia - Primary   Relevant Medications   ferrous sulfate 325 (65 FE) MG tablet   Other Relevant Orders   CBC with Differential/Platelet   HYPERLIPIDEMIA    Encouraged heart healthy diet, increase exercise, avoid trans fats, consider a krill oil cap daily      Relevant Medications   ezetimibe (ZETIA) 10 MG tablet   Low back pain   Relevant Medications   cyclobenzaprine (FLEXERIL) 10 MG tablet   Status post total replacement of right hip    In PT Pt healing well-- able to walk without walker several steps        Type 2 diabetes mellitus with diabetic neuropathy (HCC)    hgba1c acceptable, minimize simple carbs. Increase exercise as tolerated. Continue current meds Lab Results  Component Value Date   HGBA1C 6.1 (H) 02/12/2018           Other Visit Diagnoses    Hyperlipidemia associated with type 2 diabetes mellitus ([place])       Relevant Medications   ezetimibe (ZETIA) 10 MG tablet      Follow-up: Return in about 3 months (around 06/30/2018), or if symptoms worsen or fail to improve, for cpe.  [name], DO

## 2018-03-30 NOTE — Assessment & Plan Note (Signed)
hgba1c acceptable, minimize simple carbs. Increase exercise as tolerated. Continue current meds Lab Results  Component Value Date   HGBA1C 6.1 (H) 02/12/2018

## 2018-03-30 NOTE — Patient Instructions (Signed)

## 2018-03-30 NOTE — Assessment & Plan Note (Signed)
In PT Pt healing well-- able to walk without walker several steps

## 2018-04-02 DIAGNOSIS — M62551 Muscle wasting and atrophy, not elsewhere classified, right thigh: Secondary | ICD-10-CM | POA: Diagnosis not present

## 2018-04-02 DIAGNOSIS — M25551 Pain in right hip: Secondary | ICD-10-CM | POA: Diagnosis not present

## 2018-04-02 DIAGNOSIS — R278 Other lack of coordination: Secondary | ICD-10-CM | POA: Diagnosis not present

## 2018-04-02 DIAGNOSIS — Z471 Aftercare following joint replacement surgery: Secondary | ICD-10-CM | POA: Diagnosis not present

## 2018-04-02 DIAGNOSIS — M1611 Unilateral primary osteoarthritis, right hip: Secondary | ICD-10-CM | POA: Diagnosis not present

## 2018-04-02 DIAGNOSIS — R2689 Other abnormalities of gait and mobility: Secondary | ICD-10-CM | POA: Diagnosis not present

## 2018-04-03 DIAGNOSIS — D485 Neoplasm of uncertain behavior of skin: Secondary | ICD-10-CM | POA: Diagnosis not present

## 2018-04-03 DIAGNOSIS — L821 Other seborrheic keratosis: Secondary | ICD-10-CM | POA: Diagnosis not present

## 2018-04-03 DIAGNOSIS — L565 Disseminated superficial actinic porokeratosis (DSAP): Secondary | ICD-10-CM | POA: Diagnosis not present

## 2018-04-04 ENCOUNTER — Encounter (INDEPENDENT_AMBULATORY_CARE_PROVIDER_SITE_OTHER): Payer: Self-pay | Admitting: Orthopaedic Surgery

## 2018-04-04 ENCOUNTER — Ambulatory Visit (INDEPENDENT_AMBULATORY_CARE_PROVIDER_SITE_OTHER): Payer: Medicare Other | Admitting: Orthopaedic Surgery

## 2018-04-04 DIAGNOSIS — M25551 Pain in right hip: Secondary | ICD-10-CM | POA: Diagnosis not present

## 2018-04-04 DIAGNOSIS — R278 Other lack of coordination: Secondary | ICD-10-CM | POA: Diagnosis not present

## 2018-04-04 DIAGNOSIS — M1611 Unilateral primary osteoarthritis, right hip: Secondary | ICD-10-CM | POA: Diagnosis not present

## 2018-04-04 DIAGNOSIS — R2689 Other abnormalities of gait and mobility: Secondary | ICD-10-CM | POA: Diagnosis not present

## 2018-04-04 DIAGNOSIS — Z471 Aftercare following joint replacement surgery: Secondary | ICD-10-CM | POA: Diagnosis not present

## 2018-04-04 DIAGNOSIS — M62551 Muscle wasting and atrophy, not elsewhere classified, right thigh: Secondary | ICD-10-CM | POA: Diagnosis not present

## 2018-04-04 DIAGNOSIS — Z96641 Presence of right artificial hip joint: Secondary | ICD-10-CM

## 2018-04-04 NOTE — Progress Notes (Signed)
The patient is 40 days status post a right total hip arthroplasty.  Her postoperative course has been slightly complicated with recurrent seroma but overall she is mobilizing better and doing well overall.  On exam there is just a small seroma at this point.  However I did aspirate 80 cc of fluid from the area so we will be larger than expected but it was much less than it was each week.  There is no redness there is no evidence of infection at all.  She tolerates me putting her hip easily through range of motion.  At this point we will see her back in 2 weeks to see how she is doing overall.  No x-rays needed at that visit.  We will aspirate as needed for any recurrence of the seroma.  All question concerns were answered and addressed.

## 2018-04-10 DIAGNOSIS — R278 Other lack of coordination: Secondary | ICD-10-CM | POA: Diagnosis not present

## 2018-04-10 DIAGNOSIS — M25551 Pain in right hip: Secondary | ICD-10-CM | POA: Diagnosis not present

## 2018-04-10 DIAGNOSIS — M1611 Unilateral primary osteoarthritis, right hip: Secondary | ICD-10-CM | POA: Diagnosis not present

## 2018-04-10 DIAGNOSIS — Z471 Aftercare following joint replacement surgery: Secondary | ICD-10-CM | POA: Diagnosis not present

## 2018-04-10 DIAGNOSIS — M62551 Muscle wasting and atrophy, not elsewhere classified, right thigh: Secondary | ICD-10-CM | POA: Diagnosis not present

## 2018-04-10 DIAGNOSIS — R2689 Other abnormalities of gait and mobility: Secondary | ICD-10-CM | POA: Diagnosis not present

## 2018-04-12 DIAGNOSIS — M62551 Muscle wasting and atrophy, not elsewhere classified, right thigh: Secondary | ICD-10-CM | POA: Diagnosis not present

## 2018-04-12 DIAGNOSIS — M25551 Pain in right hip: Secondary | ICD-10-CM | POA: Diagnosis not present

## 2018-04-12 DIAGNOSIS — R2689 Other abnormalities of gait and mobility: Secondary | ICD-10-CM | POA: Diagnosis not present

## 2018-04-12 DIAGNOSIS — Z471 Aftercare following joint replacement surgery: Secondary | ICD-10-CM | POA: Diagnosis not present

## 2018-04-12 DIAGNOSIS — M1611 Unilateral primary osteoarthritis, right hip: Secondary | ICD-10-CM | POA: Diagnosis not present

## 2018-04-12 DIAGNOSIS — R278 Other lack of coordination: Secondary | ICD-10-CM | POA: Diagnosis not present

## 2018-04-16 DIAGNOSIS — R278 Other lack of coordination: Secondary | ICD-10-CM | POA: Diagnosis not present

## 2018-04-16 DIAGNOSIS — M1611 Unilateral primary osteoarthritis, right hip: Secondary | ICD-10-CM | POA: Diagnosis not present

## 2018-04-16 DIAGNOSIS — M25551 Pain in right hip: Secondary | ICD-10-CM | POA: Diagnosis not present

## 2018-04-16 DIAGNOSIS — Z471 Aftercare following joint replacement surgery: Secondary | ICD-10-CM | POA: Diagnosis not present

## 2018-04-16 DIAGNOSIS — M62551 Muscle wasting and atrophy, not elsewhere classified, right thigh: Secondary | ICD-10-CM | POA: Diagnosis not present

## 2018-04-16 DIAGNOSIS — R2689 Other abnormalities of gait and mobility: Secondary | ICD-10-CM | POA: Diagnosis not present

## 2018-04-18 ENCOUNTER — Encounter (INDEPENDENT_AMBULATORY_CARE_PROVIDER_SITE_OTHER): Payer: Self-pay | Admitting: Orthopaedic Surgery

## 2018-04-18 ENCOUNTER — Ambulatory Visit (INDEPENDENT_AMBULATORY_CARE_PROVIDER_SITE_OTHER): Payer: Medicare Other | Admitting: Orthopaedic Surgery

## 2018-04-18 ENCOUNTER — Telehealth: Payer: Self-pay | Admitting: *Deleted

## 2018-04-18 DIAGNOSIS — Z96641 Presence of right artificial hip joint: Secondary | ICD-10-CM

## 2018-04-18 NOTE — Telephone Encounter (Signed)
Received Physician Orders from [facility]; forwarded to provider/SLS 11/20

## 2018-04-18 NOTE — Progress Notes (Signed)
The patient is continue to follow-up status post a right total hip arthroplasty.  She is 54 days postop.  She said she has been now trying things with a cane and walking better overall.  She is on a rolling walker today.  On exam there is a small seroma.  I did withdrawal 50 cc of fluid from the area but this is much less than she had before.  There is no evidence of infection or cellulitis.  She is very pleased overall.  She denies any pain.  At this point I still would like to see her back in 4 weeks from now and have a supine AP pelvis x-ray.  We will not need to aspirate her at that point.

## 2018-04-19 DIAGNOSIS — M62551 Muscle wasting and atrophy, not elsewhere classified, right thigh: Secondary | ICD-10-CM | POA: Diagnosis not present

## 2018-04-19 DIAGNOSIS — M1611 Unilateral primary osteoarthritis, right hip: Secondary | ICD-10-CM | POA: Diagnosis not present

## 2018-04-19 DIAGNOSIS — M25551 Pain in right hip: Secondary | ICD-10-CM | POA: Diagnosis not present

## 2018-04-19 DIAGNOSIS — R2689 Other abnormalities of gait and mobility: Secondary | ICD-10-CM | POA: Diagnosis not present

## 2018-04-19 DIAGNOSIS — Z471 Aftercare following joint replacement surgery: Secondary | ICD-10-CM | POA: Diagnosis not present

## 2018-04-19 DIAGNOSIS — R278 Other lack of coordination: Secondary | ICD-10-CM | POA: Diagnosis not present

## 2018-04-24 DIAGNOSIS — R278 Other lack of coordination: Secondary | ICD-10-CM | POA: Diagnosis not present

## 2018-04-24 DIAGNOSIS — M25551 Pain in right hip: Secondary | ICD-10-CM | POA: Diagnosis not present

## 2018-04-24 DIAGNOSIS — Z471 Aftercare following joint replacement surgery: Secondary | ICD-10-CM | POA: Diagnosis not present

## 2018-04-24 DIAGNOSIS — M62551 Muscle wasting and atrophy, not elsewhere classified, right thigh: Secondary | ICD-10-CM | POA: Diagnosis not present

## 2018-04-24 DIAGNOSIS — R2689 Other abnormalities of gait and mobility: Secondary | ICD-10-CM | POA: Diagnosis not present

## 2018-04-24 DIAGNOSIS — M1611 Unilateral primary osteoarthritis, right hip: Secondary | ICD-10-CM | POA: Diagnosis not present

## 2018-04-27 ENCOUNTER — Other Ambulatory Visit: Payer: Self-pay | Admitting: Family Medicine

## 2018-04-27 DIAGNOSIS — M545 Low back pain, unspecified: Secondary | ICD-10-CM

## 2018-05-07 DIAGNOSIS — R278 Other lack of coordination: Secondary | ICD-10-CM | POA: Diagnosis not present

## 2018-05-07 DIAGNOSIS — Z471 Aftercare following joint replacement surgery: Secondary | ICD-10-CM | POA: Diagnosis not present

## 2018-05-07 DIAGNOSIS — M1611 Unilateral primary osteoarthritis, right hip: Secondary | ICD-10-CM | POA: Diagnosis not present

## 2018-05-07 DIAGNOSIS — R2689 Other abnormalities of gait and mobility: Secondary | ICD-10-CM | POA: Diagnosis not present

## 2018-05-07 DIAGNOSIS — M62551 Muscle wasting and atrophy, not elsewhere classified, right thigh: Secondary | ICD-10-CM | POA: Diagnosis not present

## 2018-05-07 DIAGNOSIS — M25551 Pain in right hip: Secondary | ICD-10-CM | POA: Diagnosis not present

## 2018-05-11 ENCOUNTER — Other Ambulatory Visit: Payer: Self-pay | Admitting: Family Medicine

## 2018-05-16 NOTE — Telephone Encounter (Signed)
We last filled for her on 12/04/17 and then was filled for 12 tabs by someone else on 12/31/17.  Should she call ortho?

## 2018-05-17 ENCOUNTER — Ambulatory Visit (INDEPENDENT_AMBULATORY_CARE_PROVIDER_SITE_OTHER): Payer: Self-pay

## 2018-05-17 ENCOUNTER — Encounter (INDEPENDENT_AMBULATORY_CARE_PROVIDER_SITE_OTHER): Payer: Self-pay | Admitting: Orthopaedic Surgery

## 2018-05-17 ENCOUNTER — Ambulatory Visit (INDEPENDENT_AMBULATORY_CARE_PROVIDER_SITE_OTHER): Payer: Medicare Other | Admitting: Orthopaedic Surgery

## 2018-05-17 DIAGNOSIS — Z96641 Presence of right artificial hip joint: Secondary | ICD-10-CM | POA: Diagnosis not present

## 2018-05-17 NOTE — Progress Notes (Signed)
The patient is getting close to 3 months status post a right total hip arthroplasty.  She has had a previous left total hip.  Postoperatively we had to drain a seroma for several weeks but then that finally subsided.  She is now getting around with a cane.  She is very pleased overall.  She essentially has minimal pain and discomfort.  On examination of her right hip incision there is no evidence of cellulitis.  Her swelling is gone down dramatically.  There is no evidence of cellulitis in her legs.  She tolerates me putting both hips the range of motion.  An AP pelvis today shows well-seated implants on the AP view with no complicating features.  This point should continue increase her activities as comfort allows.  I will see her back in 6 months from now unless there is any issues prior to then.  At that visit I would like a standing low AP pelvis.

## 2018-05-25 ENCOUNTER — Encounter: Payer: Self-pay | Admitting: Internal Medicine

## 2018-05-25 ENCOUNTER — Ambulatory Visit: Payer: Medicare Other | Admitting: Internal Medicine

## 2018-05-25 VITALS — BP 122/60 | HR 72 | Ht 63.5 in | Wt 158.4 lb

## 2018-05-25 DIAGNOSIS — J302 Other seasonal allergic rhinitis: Secondary | ICD-10-CM | POA: Diagnosis not present

## 2018-05-25 DIAGNOSIS — J3089 Other allergic rhinitis: Secondary | ICD-10-CM | POA: Diagnosis not present

## 2018-05-25 DIAGNOSIS — J4520 Mild intermittent asthma, uncomplicated: Secondary | ICD-10-CM

## 2018-05-25 NOTE — Patient Instructions (Signed)
We can continue current meds, including generic Advair  Please call if we can help

## 2018-05-25 NOTE — Progress Notes (Signed)
HPI female never smoker followed for allergic rhinitis, asthma, complicated by history embolic CVA complicating C-spine surgery, DM 2  --------------------------------------------------------------------- 05/25/17-77 year old female never smoker followed for allergic rhinitis, asthma, complicated by history embolic CVA complicating C-spine surgery, DM 2 ---Asthma; Pt states she is doing pretty good overall; had slight wheezing recently and used rescue inhaler. Advair 250, ProAir Mild increased wheeze in the holiday season, increasing rescue inhaler use to about twice daily.  No obvious infection and no sleep disturbance.  Had spine surgery in March and mobility is still limited-rolling walker.  05/25/2018- 77 year old female never smoker followed for allergic rhinitis, asthma, complicated by history embolic CVA complicating C-spine surgery, DM 2 Follows for: asthma, allergic rhinitis  Pro-air, Dymista, Advair 250 She has been changed from Advair to [employer].  She says it is not quite as good, but sufficient.  Very rare need for rescue inhaler.  Has had flu vaccine.  Living now at [place].  Line had no respiratory problems with hip/low back surgery earlier this year and is now more mobile.  ROV-see HPI + = positive Constitutional:   No-   weight loss, night sweats, fevers, chills, fatigue, lassitude. HEENT:   No-  headaches, difficulty swallowing, tooth/dental problems, sore throat,       No- sneezing, itching, ear ache, +nasal congestion, post nasal drip,  CV:  chest pain, no-orthopnea, PND, swelling in lower extremities, anasarca, dizziness, palpitations Resp: No-   shortness of breath with exertion or at rest.              No-   productive cough,  No non-productive cough,  No- coughing up of blood.              No-   change in color of mucus.  + Wheezing.   Skin: No-   rash or lesions. GI:  GU:  MS:  No-   joint pain or swelling.  No- decreased range of motion.  Back pain after  surgery Neuro-     nothing unusual Psych:  No- change in mood or affect. No depression or anxiety.  No memory loss.  OBJ General- Alert, Oriented, Affect-appropriate, Distress- none acute, looks very well Skin- rash-none, lesions- none, excoriation- none Lymphadenopathy- none Head- atraumatic            Eyes- Gross vision intact, PERRLA, conjunctivae clear secretions            Ears- Hearing, canals-normal            Nose- Clear, no-Septal dev, mucus, polyps, erosion, perforation             Throat- Mallampati II , mucosa clear , drainage- none, tonsils- atrophic,  Neck- flexible , trachea midline, no stridor , thyroid nl, carotid no bruit Chest - symmetrical excursion , unlabored           Heart/CV- RRR , no murmur , no gallop  , no rub, nl s1 s2                           - JVD- none , edema- none, stasis changes- none, varices- none           Lung-  Wheeze- none, cough-none , dullness-none, rub- none           Chest wall-  Abd-  Br/ Gen/ Rectal- Not done, not indicated Extrem- cyanosis- none, clubbing, none, atrophy- none, strength- nl, + cane Neuro- grossly intact to observation

## 2018-05-25 NOTE — Assessment & Plan Note (Signed)
Controlled without exacerbation Plan-continue current meds.  She will call for refills when needed.

## 2018-05-25 NOTE — Assessment & Plan Note (Signed)
Notices a little stuffiness but is not having disruptive nasal congestion or drainage. -Flonase or Dymista sufficient if needed.

## 2018-06-04 DIAGNOSIS — I10 Essential (primary) hypertension: Secondary | ICD-10-CM | POA: Diagnosis not present

## 2018-06-04 DIAGNOSIS — M5416 Radiculopathy, lumbar region: Secondary | ICD-10-CM | POA: Diagnosis not present

## 2018-06-04 DIAGNOSIS — M48062 Spinal stenosis, lumbar region with neurogenic claudication: Secondary | ICD-10-CM | POA: Diagnosis not present

## 2018-06-04 DIAGNOSIS — M4120 Other idiopathic scoliosis, site unspecified: Secondary | ICD-10-CM | POA: Diagnosis not present

## 2018-06-14 ENCOUNTER — Encounter: Payer: Self-pay | Admitting: Family Medicine

## 2018-06-14 ENCOUNTER — Ambulatory Visit (INDEPENDENT_AMBULATORY_CARE_PROVIDER_SITE_OTHER): Payer: Medicare Other | Admitting: Family Medicine

## 2018-06-14 VITALS — BP 123/60 | HR 69 | Temp 87.8°F | Ht 64.0 in | Wt 156.4 lb

## 2018-06-14 DIAGNOSIS — E1169 Type 2 diabetes mellitus with other specified complication: Secondary | ICD-10-CM | POA: Diagnosis not present

## 2018-06-14 DIAGNOSIS — E1149 Type 2 diabetes mellitus with other diabetic neurological complication: Secondary | ICD-10-CM | POA: Diagnosis not present

## 2018-06-14 DIAGNOSIS — E785 Hyperlipidemia, unspecified: Secondary | ICD-10-CM

## 2018-06-14 DIAGNOSIS — E1151 Type 2 diabetes mellitus with diabetic peripheral angiopathy without gangrene: Secondary | ICD-10-CM

## 2018-06-14 DIAGNOSIS — E782 Mixed hyperlipidemia: Secondary | ICD-10-CM

## 2018-06-14 LAB — LIPID PANEL
CHOL/HDL RATIO: 4
Cholesterol: 190 mg/dL (ref 0–200)
HDL: 54 mg/dL (ref >39.00)
LDL CALC: 99 mg/dL (ref 0–99)
NONHDL: 135.91
TRIGLYCERIDES: 184 mg/dL — AB (ref 0.0–149.0)
VLDL: 36.8 mg/dL (ref 0.0–40.0)

## 2018-06-14 LAB — COMPREHENSIVE METABOLIC PANEL
ALT: 15 U/L (ref 0–35)
AST: 16 U/L (ref 0–37)
Albumin: 3.9 g/dL (ref 3.5–5.2)
Alkaline Phosphatase: 85 U/L (ref 39–117)
BILIRUBIN TOTAL: 0.4 mg/dL (ref 0.2–1.2)
BUN: 18 mg/dL (ref 6–23)
CO2: 27 mEq/L (ref 19–32)
CREATININE: 0.95 mg/dL (ref 0.40–1.20)
Calcium: 9.8 mg/dL (ref 8.4–10.5)
Chloride: 106 mEq/L (ref 96–112)
GFR: 60.57 mL/min (ref >60.00)
Glucose, Bld: 105 mg/dL — ABNORMAL HIGH (ref 70–99)
Potassium: 4.6 mEq/L (ref 3.5–5.1)
Sodium: 139 mEq/L (ref 135–145)
TOTAL PROTEIN: 6.8 g/dL (ref 6.0–8.3)

## 2018-06-14 LAB — HEMOGLOBIN A1C: Hgb A1c MFr Bld: 6.5 % (ref 4.6–6.5)

## 2018-06-14 NOTE — Assessment & Plan Note (Signed)
Tolerating statin, encouraged heart healthy diet, avoid trans fats, minimize simple carbs and saturated fats. Increase exercise as tolerated 

## 2018-06-14 NOTE — Progress Notes (Signed)
+ Patient ID: [name], female    DOB: 11-Sep-1940  Age: 78 y.o. MRN: [id number]    Subjective:  Subjective  HPI [name] presents for f/u cholesterol and dm.  bs was 116 this am.  No complaints.  Pt is doing well after surgery.  She finished rehab and is starting to do exercise classes.    Review of Systems  Constitutional: Negative for appetite change, diaphoresis, fatigue and unexpected weight change.  Eyes: Negative for pain, redness and visual disturbance.  Respiratory: Negative for cough, chest tightness, shortness of breath and wheezing.   Cardiovascular: Negative for chest pain, palpitations and leg swelling.  Endocrine: Negative for cold intolerance, heat intolerance, polydipsia, polyphagia and polyuria.  Genitourinary: Negative for difficulty urinating, dysuria and frequency.  Neurological: Negative for dizziness, light-headedness, numbness and headaches.    History Past Medical History:  Diagnosis Date  . Anemia   . Arthritis   . Asthma    adult onset  . Basal cell cancer    LUE; Porokeratosis also  . Chronic kidney disease 1963   strep in kidney due to strep throat-hospitalized 10 days  . Complication of anesthesia    small trachea  . Diabetes mellitus 2010   A1c 6.7%  . DVT (deep venous thrombosis) ([place]) 2006   post immobilization post cns surgery  . GERD (gastroesophageal reflux disease)    very mild  . Granulomatous lung disease ([place]) 2002   incidental Xray finding  . Hyperlipidemia   . Hypertension 2004   Hypertensive response on Stress Test  . Paralysis ([place]) 2006   post cervical fusion with spinal sac tear  with hematoma   . PTE (pulmonary thromboembolism) ([place]) 2006    She has a past surgical history that includes Bunionectomy; Septoplasty; Tubal ligation; Colonoscopy (1992 & 2002); Rotator cuff repair (2009); epidural steroids (2006); Cervical fusion (2006); Tonsillectomy (78 years old); Total hip arthroplasty (06/01/2012); Anterior lumbar fusion  (N/A, 08/12/2016); Anterior lat lumbar fusion (N/A, 08/12/2016); Lumbar percutaneous pedicle screw 4 level (N/A, 08/12/2016); Abdominal exposure (N/A, 08/12/2016); and Total hip arthroplasty (Right, 02/23/2018).   Her family history includes COPD in her father; Cancer in her mother; Diabetes in her sister; Heart disease in her sister; Stroke in her maternal grandmother; Transient ischemic attack in her paternal aunt.She reports that she has never smoked. She has never used smokeless tobacco. She reports current alcohol use of about 1.0 standard drinks of alcohol per week. She reports that she does not use drugs.  Current Outpatient Medications on File Prior to Visit  Medication Sig Dispense Refill  . acetaminophen (TYLENOL) 650 MG CR tablet Take 650-1,300 mg by mouth 2 (two) times daily as needed for pain.    [name] albuterol (PROAIR HFA) 108 (90 Base) MCG/ACT inhaler Inhale 2 puffs into the lungs every 6 (six) hours as needed for wheezing or shortness of breath. 3 Inhaler 3  . alendronate (FOSAMAX) 70 MG tablet TAKE 1 TABLET(70 MG) BY MOUTH EVERY 7 DAYS WITH A FULL GLASS OF WATER AND ON AN EMPTY STOMACH 12 tablet 1  . aspirin 81 MG chewable tablet Chew 1 tablet (81 mg total) by mouth 2 (two) times daily. 30 tablet 0  . Azelastine-Fluticasone (DYMISTA) 137-50 MCG/ACT SUSP Place 2 sprays into the nose 2 (two) times daily.    . Calcium Citrate-Vitamin D (CALCIUM + D PO) Take 1 tablet by mouth daily.    . celecoxib (CELEBREX) 200 MG capsule TAKE 1 CAPSULE(200 MG) BY MOUTH DAILY 90  capsule 0  . cholecalciferol (VITAMIN D) 1000 UNITS tablet Take 1,000 Units by mouth daily.     . cyclobenzaprine (FLEXERIL) 10 MG tablet Take 1 tablet (10 mg total) by mouth 3 (three) times daily as needed for muscle spasms. 30 tablet 1  . ezetimibe (ZETIA) 10 MG tablet Take 1 tablet (10 mg total) by mouth daily. 90 tablet 1  . ferrous sulfate 325 (65 FE) MG tablet Take 1 tablet (325 mg total) by mouth daily with breakfast. 90 tablet 1   . Fluticasone-Salmeterol (ADVAIR DISKUS) 250-50 MCG/DOSE AEPB USE 1 INHALATION TWO TIMES  DAILY ; RINSE MOUTH 180 each 3  . metFORMIN (GLUCOPHAGE-XR) 500 MG 24 hr tablet TAKE 1 TABLET(500 MG) BY MOUTH DAILY WITH BREAKFAST 90 tablet 1  . Multiple Vitamin (MULTIVITAMIN WITH MINERALS) TABS tablet Take 1 tablet by mouth daily.    . Multiple Vitamins-Minerals (OCUVITE EXTRA PO) Take 1 tablet by mouth daily.    . mupirocin ointment (BACTROBAN) 2 % Place 1 application into the nose daily.    [name] MYRBETRIQ 50 MG TB24 tablet TAKE 1 TABLET(50 MG) BY MOUTH DAILY 30 tablet 5  . Polyethyl Glycol-Propyl Glycol (SYSTANE OP) Place 1 drop into both eyes daily as needed (dry eyes).    . polyethylene glycol (MIRALAX / GLYCOLAX) packet Take 17 g by mouth daily.    . pregabalin (LYRICA) 100 MG capsule Take 1 capsule (100 mg total) by mouth 3 (three) times daily. 270 capsule 1  . traMADol (ULTRAM) 50 MG tablet TAKE 1 TABLET BY MOUTH EVERY 6 HOURS AS NEEDED 60 tablet 1  . Trolamine Salicylate (ASPERCREME EX) Apply 1 application topically daily as needed (back pain).     No current facility-administered medications on file prior to visit.      Objective:  Objective  Physical Exam Vitals signs and nursing note reviewed.  Constitutional:      Appearance: She is well-developed.  HENT:     Head: Normocephalic and atraumatic.  Eyes:     Conjunctiva/sclera: Conjunctivae normal.  Neck:     Musculoskeletal: Normal range of motion and neck supple.     Thyroid: No thyromegaly.     Vascular: No carotid bruit or JVD.  Cardiovascular:     Rate and Rhythm: Normal rate and regular rhythm.     Heart sounds: Normal heart sounds. No murmur.  Pulmonary:     Effort: Pulmonary effort is normal. No respiratory distress.     Breath sounds: Normal breath sounds. No wheezing or rales.  Chest:     Chest wall: No tenderness.  Neurological:     Mental Status: She is alert and oriented to person, place, and time.    BP 123/60  (BP Location: Right Arm, Patient Position: Sitting, Cuff Size: Normal)   Pulse 69   Temp (!) 87.8 F (31 C) (Oral)   Ht 5\' 4"  (1.626 m)   Wt 156 lb 6.4 oz (70.9 kg)   SpO2 95%   BMI 26.85 kg/m  Wt Readings from Last 3 Encounters:  06/14/18 156 lb 6.4 oz (70.9 kg)  05/25/18 158 lb 6.4 oz (71.8 kg)  03/30/18 156 lb (70.8 kg)     Lab Results  Component Value Date   WBC 8.5 03/30/2018   HGB 10.7 (L) 03/30/2018   HCT 32.5 (L) 03/30/2018   PLT 277.0 03/30/2018   GLUCOSE 105 (H) 06/14/2018   CHOL 190 06/14/2018   TRIG 184.0 (H) 06/14/2018   HDL 54.00 06/14/2018   LDLDIRECT  127.0 12/11/2017   LDLCALC 99 06/14/2018   ALT 15 06/14/2018   AST 16 06/14/2018   NA 139 06/14/2018   K 4.6 06/14/2018   CL 106 06/14/2018   CREATININE 0.95 06/14/2018   BUN 18 06/14/2018   CO2 27 06/14/2018   TSH 5.38 (H) 06/03/2016   INR 0.96 05/28/2012   HGBA1C 6.5 06/14/2018   MICROALBUR <0.7 12/11/2017    No results found.   Assessment & Plan:  Plan  I am having Joesph Fillers maintain her cholecalciferol, albuterol, Multiple Vitamins-Minerals (OCUVITE EXTRA PO), multivitamin with minerals, Fluticasone-Salmeterol, celecoxib, Trolamine Salicylate (ASPERCREME EX), Polyethyl Glycol-Propyl Glycol (SYSTANE OP), pregabalin, alendronate, metFORMIN, MYRBETRIQ, Calcium Citrate-Vitamin D (CALCIUM + D PO), acetaminophen, aspirin, Azelastine-Fluticasone, polyethylene glycol, mupirocin ointment, ferrous sulfate, ezetimibe, cyclobenzaprine, and traMADol.  No orders of the defined types were placed in this encounter.   Problem List Items Addressed This Visit      Unprioritized   Diabetes mellitus with peripheral vascular disease ([place])    hgba1c to be checked , minimize simple carbs. Increase exercise as tolerated. Continue current meds      HYPERLIPIDEMIA    Tolerating statin, encouraged heart healthy diet, avoid trans fats, minimize simple carbs and saturated fats. Increase exercise as tolerated         Other Visit Diagnoses    Hyperlipidemia associated with type 2 diabetes mellitus ([place])    -  Primary   Relevant Orders   Lipid panel (Completed)   Hemoglobin A1c (Completed)   Comprehensive metabolic panel (Completed)   Type 2 diabetes mellitus with neurological complications ([facility])       Relevant Orders   Lipid panel (Completed)   Hemoglobin A1c (Completed)   Comprehensive metabolic panel (Completed)      Follow-up: Return in about 6 months (around 12/13/2018), or if symptoms worsen or fail to improve, for annual exam, fasting.  [name], DO

## 2018-06-14 NOTE — Patient Instructions (Signed)

## 2018-06-14 NOTE — Assessment & Plan Note (Signed)
hgba1c to be checked, minimize simple carbs. Increase exercise as tolerated. Continue current meds  

## 2018-06-29 ENCOUNTER — Other Ambulatory Visit: Payer: Self-pay | Admitting: Family Medicine

## 2018-06-29 DIAGNOSIS — M545 Low back pain, unspecified: Secondary | ICD-10-CM

## 2018-07-06 ENCOUNTER — Other Ambulatory Visit: Payer: Self-pay

## 2018-07-06 ENCOUNTER — Encounter (HOSPITAL_COMMUNITY): Payer: Self-pay

## 2018-07-06 ENCOUNTER — Emergency Department (HOSPITAL_COMMUNITY): Payer: Medicare Other

## 2018-07-06 ENCOUNTER — Ambulatory Visit: Payer: Self-pay | Admitting: *Deleted

## 2018-07-06 ENCOUNTER — Observation Stay (HOSPITAL_COMMUNITY)
Admission: EM | Admit: 2018-07-06 | Discharge: 2018-07-07 | Disposition: A | Discharge status: Home / Self Care | Payer: Medicare Other | Attending: Internal Medicine | Admitting: Internal Medicine

## 2018-07-06 DIAGNOSIS — Z86711 Personal history of pulmonary embolism: Secondary | ICD-10-CM | POA: Insufficient documentation

## 2018-07-06 DIAGNOSIS — Z86718 Personal history of other venous thrombosis and embolism: Secondary | ICD-10-CM | POA: Diagnosis not present

## 2018-07-06 DIAGNOSIS — M169 Osteoarthritis of hip, unspecified: Secondary | ICD-10-CM | POA: Diagnosis present

## 2018-07-06 DIAGNOSIS — J181 Lobar pneumonia, unspecified organism: Secondary | ICD-10-CM | POA: Diagnosis not present

## 2018-07-06 DIAGNOSIS — Z7982 Long term (current) use of aspirin: Secondary | ICD-10-CM | POA: Insufficient documentation

## 2018-07-06 DIAGNOSIS — Z96641 Presence of right artificial hip joint: Secondary | ICD-10-CM

## 2018-07-06 DIAGNOSIS — M6281 Muscle weakness (generalized): Secondary | ICD-10-CM | POA: Insufficient documentation

## 2018-07-06 DIAGNOSIS — R262 Difficulty in walking, not elsewhere classified: Secondary | ICD-10-CM | POA: Insufficient documentation

## 2018-07-06 DIAGNOSIS — R079 Chest pain, unspecified: Secondary | ICD-10-CM | POA: Diagnosis not present

## 2018-07-06 DIAGNOSIS — D649 Anemia, unspecified: Secondary | ICD-10-CM | POA: Diagnosis present

## 2018-07-06 DIAGNOSIS — Z791 Long term (current) use of non-steroidal anti-inflammatories (NSAID): Secondary | ICD-10-CM | POA: Insufficient documentation

## 2018-07-06 DIAGNOSIS — Z888 Allergy status to other drugs, medicaments and biological substances status: Secondary | ICD-10-CM | POA: Diagnosis not present

## 2018-07-06 DIAGNOSIS — J302 Other seasonal allergic rhinitis: Secondary | ICD-10-CM | POA: Diagnosis present

## 2018-07-06 DIAGNOSIS — E1151 Type 2 diabetes mellitus with diabetic peripheral angiopathy without gangrene: Secondary | ICD-10-CM | POA: Diagnosis not present

## 2018-07-06 DIAGNOSIS — Z66 Do not resuscitate: Secondary | ICD-10-CM | POA: Diagnosis not present

## 2018-07-06 DIAGNOSIS — R030 Elevated blood-pressure reading, without diagnosis of hypertension: Secondary | ICD-10-CM | POA: Diagnosis present

## 2018-07-06 DIAGNOSIS — J4520 Mild intermittent asthma, uncomplicated: Secondary | ICD-10-CM | POA: Diagnosis present

## 2018-07-06 DIAGNOSIS — I2510 Atherosclerotic heart disease of native coronary artery without angina pectoris: Secondary | ICD-10-CM | POA: Insufficient documentation

## 2018-07-06 DIAGNOSIS — M1610 Unilateral primary osteoarthritis, unspecified hip: Secondary | ICD-10-CM | POA: Diagnosis not present

## 2018-07-06 DIAGNOSIS — Z794 Long term (current) use of insulin: Secondary | ICD-10-CM | POA: Diagnosis not present

## 2018-07-06 DIAGNOSIS — E785 Hyperlipidemia, unspecified: Secondary | ICD-10-CM | POA: Insufficient documentation

## 2018-07-06 DIAGNOSIS — Z96643 Presence of artificial hip joint, bilateral: Secondary | ICD-10-CM | POA: Diagnosis not present

## 2018-07-06 DIAGNOSIS — E1122 Type 2 diabetes mellitus with diabetic chronic kidney disease: Secondary | ICD-10-CM | POA: Diagnosis not present

## 2018-07-06 DIAGNOSIS — J40 Bronchitis, not specified as acute or chronic: Secondary | ICD-10-CM | POA: Diagnosis not present

## 2018-07-06 DIAGNOSIS — K219 Gastro-esophageal reflux disease without esophagitis: Secondary | ICD-10-CM | POA: Insufficient documentation

## 2018-07-06 DIAGNOSIS — J90 Pleural effusion, not elsewhere classified: Secondary | ICD-10-CM | POA: Diagnosis not present

## 2018-07-06 DIAGNOSIS — E782 Mixed hyperlipidemia: Secondary | ICD-10-CM | POA: Diagnosis present

## 2018-07-06 DIAGNOSIS — R0781 Pleurodynia: Secondary | ICD-10-CM | POA: Diagnosis present

## 2018-07-06 DIAGNOSIS — Z79899 Other long term (current) drug therapy: Secondary | ICD-10-CM | POA: Insufficient documentation

## 2018-07-06 DIAGNOSIS — J189 Pneumonia, unspecified organism: Principal | ICD-10-CM | POA: Diagnosis present

## 2018-07-06 DIAGNOSIS — R0789 Other chest pain: Secondary | ICD-10-CM | POA: Diagnosis not present

## 2018-07-06 LAB — URINALYSIS, ROUTINE W REFLEX MICROSCOPIC
BILIRUBIN URINE: NEGATIVE
Glucose, UA: NEGATIVE mg/dL
Hgb urine dipstick: NEGATIVE
Ketones, ur: NEGATIVE mg/dL
Leukocytes, UA: NEGATIVE
Nitrite: NEGATIVE
Protein, ur: NEGATIVE mg/dL
Specific Gravity, Urine: 1.004 — ABNORMAL LOW (ref 1.005–1.030)
pH: 6 (ref 5.0–8.0)

## 2018-07-06 LAB — GLUCOSE, CAPILLARY
Glucose-Capillary: 94 mg/dL (ref 70–99)
Glucose-Capillary: 137 mg/dL — ABNORMAL HIGH (ref 70–99)

## 2018-07-06 LAB — CBC
HCT: 36.8 % (ref 36.0–46.0)
Hemoglobin: 11.8 g/dL — ABNORMAL LOW (ref 12.0–15.0)
MCH: 28.4 pg (ref 26.0–34.0)
MCHC: 32.1 g/dL (ref 30.0–36.0)
MCV: 88.5 fL (ref 80.0–100.0)
Platelets: 257 10*3/uL (ref 150–400)
RBC: 4.16 MIL/uL (ref 3.87–5.11)
RDW: 16.2 % — ABNORMAL HIGH (ref 11.5–15.5)
WBC: 9.4 10*3/uL (ref 4.0–10.5)
nRBC: 0 % (ref 0.0–0.2)

## 2018-07-06 LAB — BASIC METABOLIC PANEL
Anion gap: 8 (ref 5–15)
BUN: 8 mg/dL (ref 8–23)
CO2: 25 mmol/L (ref 22–32)
Calcium: 9 mg/dL (ref 8.9–10.3)
Chloride: 105 mmol/L (ref 98–111)
Creatinine, Ser: 0.81 mg/dL (ref 0.44–1.00)
GFR calc Af Amer: >60 mL/min (ref >60)
GFR calc non Af Amer: >60 mL/min (ref >60)
Glucose, Bld: 133 mg/dL — ABNORMAL HIGH (ref 70–99)
Potassium: 3.6 mmol/L (ref 3.5–5.1)
Sodium: 138 mmol/L (ref 135–145)

## 2018-07-06 LAB — I-STAT TROPONIN, ED
Troponin i, poc: 0 ng/mL (ref 0.00–0.08)
Troponin i, poc: 0 ng/mL (ref 0.00–0.08)

## 2018-07-06 LAB — D-DIMER, QUANTITATIVE: D-Dimer, Quant: 1.82 ug/mL-FEU — ABNORMAL HIGH (ref 0.00–0.50)

## 2018-07-06 LAB — MAGNESIUM: Magnesium: 2.1 mg/dL (ref 1.7–2.4)

## 2018-07-06 LAB — STREP PNEUMONIAE URINARY ANTIGEN: Strep Pneumo Urinary Antigen: NEGATIVE

## 2018-07-06 MED ORDER — MOMETASONE FURO-FORMOTEROL FUM 200-5 MCG/ACT IN AERO
2.0000 | INHALATION_SPRAY | Freq: Two times a day (BID) | RESPIRATORY_TRACT | Status: DC
  Administered 2018-07-06 – 2018-07-07 (×2): 2 via RESPIRATORY_TRACT
  Filled 2018-07-06: qty 8.8

## 2018-07-06 MED ORDER — SODIUM CHLORIDE (PF) 0.9 % IJ SOLN
INTRAMUSCULAR | Status: AC
  Filled 2018-07-06: qty 50

## 2018-07-06 MED ORDER — FLUTICASONE PROPIONATE 50 MCG/ACT NA SUSP
2.0000 | Freq: Every day | NASAL | Status: DC
  Administered 2018-07-07: 2 via NASAL
  Filled 2018-07-06: qty 16

## 2018-07-06 MED ORDER — SODIUM CHLORIDE 0.9 % IV SOLN
500.0000 mg | INTRAVENOUS | Status: DC
  Administered 2018-07-06: 500 mg via INTRAVENOUS
  Filled 2018-07-06: qty 500

## 2018-07-06 MED ORDER — SODIUM CHLORIDE 0.9 % IV SOLN
500.0000 mg | INTRAVENOUS | Status: DC
  Filled 2018-07-06: qty 500

## 2018-07-06 MED ORDER — LEVALBUTEROL HCL 0.63 MG/3ML IN NEBU
0.6300 mg | INHALATION_SOLUTION | Freq: Three times a day (TID) | RESPIRATORY_TRACT | Status: DC
  Administered 2018-07-06 – 2018-07-07 (×2): 0.63 mg via RESPIRATORY_TRACT
  Filled 2018-07-06 (×2): qty 3

## 2018-07-06 MED ORDER — PREGABALIN 50 MG PO CAPS
100.0000 mg | ORAL_CAPSULE | Freq: Three times a day (TID) | ORAL | Status: DC
  Administered 2018-07-06 – 2018-07-07 (×4): 100 mg via ORAL
  Filled 2018-07-06 (×4): qty 2

## 2018-07-06 MED ORDER — GUAIFENESIN ER 600 MG PO TB12
1200.0000 mg | ORAL_TABLET | Freq: Two times a day (BID) | ORAL | Status: DC
  Administered 2018-07-06 – 2018-07-07 (×2): 1200 mg via ORAL
  Filled 2018-07-06 (×2): qty 2

## 2018-07-06 MED ORDER — EZETIMIBE 10 MG PO TABS
10.0000 mg | ORAL_TABLET | Freq: Every day | ORAL | Status: DC
  Administered 2018-07-07: 10 mg via ORAL
  Filled 2018-07-06: qty 1

## 2018-07-06 MED ORDER — PANTOPRAZOLE SODIUM 40 MG PO TBEC
40.0000 mg | DELAYED_RELEASE_TABLET | Freq: Every day | ORAL | Status: DC
  Administered 2018-07-06: 40 mg via ORAL
  Filled 2018-07-06: qty 1

## 2018-07-06 MED ORDER — TROLAMINE SALICYLATE 10 % EX LOTN: TOPICAL_LOTION | Freq: Every day | CUTANEOUS | Status: DC | PRN

## 2018-07-06 MED ORDER — SODIUM CHLORIDE 0.9% FLUSH
3.0000 mL | Freq: Once | INTRAVENOUS | Status: AC
  Administered 2018-07-06: 3 mL via INTRAVENOUS

## 2018-07-06 MED ORDER — IOPAMIDOL (ISOVUE-370) INJECTION 76%
INTRAVENOUS | Status: AC
  Filled 2018-07-06: qty 100

## 2018-07-06 MED ORDER — VITAMIN D 1000 UNITS PO TABS: 1000.0000 [IU] | ORAL_TABLET | Freq: Every day | ORAL | Status: DC

## 2018-07-06 MED ORDER — ASPIRIN 81 MG PO CHEW
81.0000 mg | CHEWABLE_TABLET | Freq: Every day | ORAL | Status: DC
  Administered 2018-07-07: 81 mg via ORAL
  Filled 2018-07-06: qty 1

## 2018-07-06 MED ORDER — FERROUS SULFATE 325 (65 FE) MG PO TABS
325.0000 mg | ORAL_TABLET | Freq: Every day | ORAL | Status: DC
  Administered 2018-07-07: 325 mg via ORAL
  Filled 2018-07-06: qty 1

## 2018-07-06 MED ORDER — OCUVITE-LUTEIN PO CAPS
1.0000 | ORAL_CAPSULE | Freq: Every day | ORAL | Status: DC
  Administered 2018-07-07: 1 via ORAL
  Filled 2018-07-06: qty 1

## 2018-07-06 MED ORDER — OCUVITE EXTRA PO TABS: ORAL_TABLET | Freq: Every day | ORAL | Status: DC

## 2018-07-06 MED ORDER — VITAMIN D 25 MCG (1000 UNIT) PO TABS
1000.0000 [IU] | ORAL_TABLET | Freq: Every day | ORAL | Status: DC
  Administered 2018-07-07: 1000 [IU] via ORAL
  Filled 2018-07-06: qty 1

## 2018-07-06 MED ORDER — ENSURE ENLIVE PO LIQD
237.0000 mL | Freq: Two times a day (BID) | ORAL | Status: DC
  Administered 2018-07-07: 237 mL via ORAL

## 2018-07-06 MED ORDER — ONDANSETRON HCL 4 MG/2ML IJ SOLN: 4.0000 mg | Freq: Four times a day (QID) | INTRAMUSCULAR | Status: DC | PRN

## 2018-07-06 MED ORDER — ALBUTEROL SULFATE HFA 108 (90 BASE) MCG/ACT IN AERS: 2.0000 | INHALATION_SPRAY | Freq: Four times a day (QID) | RESPIRATORY_TRACT | Status: DC | PRN

## 2018-07-06 MED ORDER — SODIUM CHLORIDE 0.9% FLUSH
3.0000 mL | Freq: Two times a day (BID) | INTRAVENOUS | Status: DC
  Administered 2018-07-06: 3 mL via INTRAVENOUS

## 2018-07-06 MED ORDER — ACETAMINOPHEN 650 MG RE SUPP: 650.0000 mg | Freq: Four times a day (QID) | RECTAL | Status: DC | PRN

## 2018-07-06 MED ORDER — ONDANSETRON HCL 4 MG PO TABS: 4.0000 mg | ORAL_TABLET | Freq: Four times a day (QID) | ORAL | Status: DC | PRN

## 2018-07-06 MED ORDER — IOPAMIDOL (ISOVUE-370) INJECTION 76%
100.0000 mL | Freq: Once | INTRAVENOUS | Status: AC | PRN
  Administered 2018-07-06: 100 mL via INTRAVENOUS

## 2018-07-06 MED ORDER — POLYETHYLENE GLYCOL 3350 17 G PO PACK: 17.0000 g | PACK | Freq: Every day | ORAL | Status: DC | PRN

## 2018-07-06 MED ORDER — INSULIN ASPART 100 UNIT/ML ~~LOC~~ SOLN: 0.0000 [IU] | Freq: Three times a day (TID) | SUBCUTANEOUS | Status: DC

## 2018-07-06 MED ORDER — ALBUTEROL SULFATE (2.5 MG/3ML) 0.083% IN NEBU: 2.5000 mg | INHALATION_SOLUTION | Freq: Four times a day (QID) | RESPIRATORY_TRACT | Status: DC | PRN

## 2018-07-06 MED ORDER — TROLAMINE SALICYLATE 10 % EX CREA
TOPICAL_CREAM | Freq: Every day | CUTANEOUS | Status: DC | PRN
  Filled 2018-07-06: qty 85

## 2018-07-06 MED ORDER — MAGNESIUM CITRATE PO SOLN: 1.0000 | Freq: Once | ORAL | Status: DC | PRN

## 2018-07-06 MED ORDER — LORATADINE 10 MG PO TABS
10.0000 mg | ORAL_TABLET | Freq: Every day | ORAL | Status: DC
  Administered 2018-07-06 – 2018-07-07 (×2): 10 mg via ORAL
  Filled 2018-07-06: qty 1

## 2018-07-06 MED ORDER — ADULT MULTIVITAMIN W/MINERALS CH
1.0000 | ORAL_TABLET | Freq: Every day | ORAL | Status: DC
  Administered 2018-07-07: 1 via ORAL
  Filled 2018-07-06: qty 1

## 2018-07-06 MED ORDER — MUPIROCIN 2 % EX OINT: 1.0000 "application " | TOPICAL_OINTMENT | Freq: Every day | CUTANEOUS | Status: DC | PRN

## 2018-07-06 MED ORDER — TRAMADOL HCL 50 MG PO TABS: 50.0000 mg | ORAL_TABLET | Freq: Four times a day (QID) | ORAL | Status: DC | PRN

## 2018-07-06 MED ORDER — LEVALBUTEROL HCL 0.63 MG/3ML IN NEBU: 0.6300 mg | INHALATION_SOLUTION | Freq: Three times a day (TID) | RESPIRATORY_TRACT | Status: DC

## 2018-07-06 MED ORDER — SODIUM CHLORIDE 0.9 % IV SOLN
2.0000 g | INTRAVENOUS | Status: DC
  Administered 2018-07-06: 2 g via INTRAVENOUS
  Filled 2018-07-06: qty 20

## 2018-07-06 MED ORDER — SENNOSIDES-DOCUSATE SODIUM 8.6-50 MG PO TABS: 1.0000 | ORAL_TABLET | Freq: Every evening | ORAL | Status: DC | PRN

## 2018-07-06 MED ORDER — SODIUM CHLORIDE 0.9 % IV SOLN
1.0000 g | INTRAVENOUS | Status: DC
  Administered 2018-07-07: 1 g via INTRAVENOUS
  Filled 2018-07-06: qty 1

## 2018-07-06 MED ORDER — ENOXAPARIN SODIUM 40 MG/0.4ML ~~LOC~~ SOLN
40.0000 mg | SUBCUTANEOUS | Status: DC
  Administered 2018-07-06: 40 mg via SUBCUTANEOUS
  Filled 2018-07-06: qty 0.4

## 2018-07-06 MED ORDER — SORBITOL 70 % SOLN
30.0000 mL | Freq: Every day | Status: DC | PRN
  Filled 2018-07-06: qty 30

## 2018-07-06 MED ORDER — SODIUM CHLORIDE 0.9 % IV SOLN
INTRAVENOUS | Status: DC
  Administered 2018-07-06: 18:00:00 via INTRAVENOUS

## 2018-07-06 MED ORDER — IBUPROFEN 200 MG PO TABS
600.0000 mg | ORAL_TABLET | Freq: Three times a day (TID) | ORAL | Status: DC
  Administered 2018-07-06 – 2018-07-07 (×4): 600 mg via ORAL
  Filled 2018-07-06 (×4): qty 3

## 2018-07-06 MED ORDER — ACETAMINOPHEN 325 MG PO TABS: 650.0000 mg | ORAL_TABLET | Freq: Four times a day (QID) | ORAL | Status: DC | PRN

## 2018-07-06 MED ORDER — CYCLOBENZAPRINE HCL 10 MG PO TABS: 10.0000 mg | ORAL_TABLET | Freq: Three times a day (TID) | ORAL | Status: DC | PRN

## 2018-07-06 NOTE — Telephone Encounter (Signed)
Noted  

## 2018-07-06 NOTE — Telephone Encounter (Signed)
Dr Carollee Herter is not in the office today. Below appointment was actually scheduled for 07/10/18. Discussed concerns with NP, Inda Castle in PCP's absence. Due to nature of pt's symptoms it was felt best that pt be evaluated in the ER ASAP. Notified pt and she verbalizes understanding. States someone from her facility will be taking her to the ER.  Advised pt if symptoms worsen to call 911 for transportation. Pt voices understanding.

## 2018-07-06 NOTE — Telephone Encounter (Signed)
Pt called with complaints of chest pain with movement; she says that the pain started in her back, and moved to her chest; these pains started 07/05/2018; the pt took 1 tylenol on 07/05/2018; she also had tramadol and a muscle relaxer last night to help her sleep; pain rated 8 out of 10; recommendations made per nurse triage protocol; the pt would like to be seen in the office today; pt offered and accepted appointment with Dr Angie Fava, 07/06/2018 at 1400; she verbalized understanding; will route to office for notification.  Reason for Disposition . Patient sounds very sick or weak to the triager  Answer Assessment - Initial Assessment Questions 1. LOCATION: "Where does it hurt?"       Originally in backbone; now on left side near breast 2. RADIATION: "Does the pain go anywhere else?" (e.g., into neck, jaw, arms, back)     no 3. ONSET: "When did the chest pain begin?" (Minutes, hours or days)      07/05/2018 4. PATTERN "Does the pain come and go, or has it been constant since it started?"  "Does it get worse with exertion?"      intermittent 5. DURATION: "How long does it last" (e.g., seconds, minutes, hours)     seconds 6. SEVERITY: "How bad is the pain?"  (e.g., Scale 1-10; mild, moderate, or severe)    - MILD (1-3): doesn't interfere with normal activities     - MODERATE (4-7): interferes with normal activities or awakens from sleep    - SEVERE (8-10): excruciating pain, unable to do any normal activities       8 out of 10 7. CARDIAC RISK FACTORS: "Do you have any history of heart problems or risk factors for heart disease?" (e.g., prior heart attack, angina; high blood pressure, diabetes, being overweight, high cholesterol, smoking, or strong family history of heart disease)     High cholesterol 8. PULMONARY RISK FACTORS: "Do you have any history of lung disease?"  (e.g., blood clots in lung, asthma, emphysema, birth control pills)     asthma 9. CAUSE: "What do you think is causing the  chest pain?"     Not sure 10. OTHER SYMPTOMS: "Do you have any other symptoms?" (e.g., dizziness, nausea, vomiting, sweating, fever, difficulty breathing, cough)       nausea last Friday 06/30/2018; cough 11. PREGNANCY: "Is there any chance you are pregnant?" "When was your last menstrual period?"       no  Protocols used: CHEST PAIN-A-AH

## 2018-07-06 NOTE — ED Provider Notes (Signed)
[facility] DEPT Provider Note   CSN: [id number] Arrival date & time: 07/06/18  1159     History   Chief Complaint Chief Complaint  Patient presents with  . Chest Pain    HPI [name] is a 78 y.o. female with history of diabetes, peripheral vascular disease, hyperlipidemia, remote left pulmonary embolism after neck surgery status post Coumadin for 1 year, recent right hip replacement September 2019, is here for evaluation of left-sided chest pain.  Sudden onset late yesterday while she was sitting down.  Described as a catching, spasm, sharp sensation.  It first started on her left low back but it has radiated to her left breast.  It is worse with moving, taking deep breaths.  None she called EMS and they evaluated her, she received 324 mg aspirin PTA.  She came to the ER via private vehicle.  She takes 81 mg aspirin but no other anticoagulation.  Reports preceding URI type symptoms for 24 hours 1 week ago including body aches, subjective fevers, chills, cough, nausea and vomiting.  The symptoms have significantly improved.  Currently she denies any fevers, chills, cough, myalgias.  She lives in an independent living facility/apartment complex.  Denies recent prolonged travel, lower extremity edema or calf tenderness, hemoptysis, estrogen use, malignancy history.  Her twin sibling has history of 2 heart stents. HPI  Past Medical History:  Diagnosis Date  . Anemia   . Arthritis   . Asthma    adult onset  . Basal cell cancer    LUE; Porokeratosis also  . Chronic kidney disease 1963   strep in kidney due to strep throat-hospitalized 10 days  . Complication of anesthesia    small trachea  . Diabetes mellitus 2010   A1c 6.7%  . DVT (deep venous thrombosis) ([place]) 2006   post immobilization post cns surgery  . GERD (gastroesophageal reflux disease)    very mild  . Granulomatous lung disease ([place]) 2002   incidental Xray finding  . Hyperlipidemia   .  Hypertension 2004   Hypertensive response on Stress Test  . Paralysis ([place]) 2006   post cervical fusion with spinal sac tear  with hematoma   . PTE (pulmonary thromboembolism) ([place]) 2006    Patient Active Problem List   Diagnosis Date Noted  . CAP (community acquired pneumonia) 07/06/2018  . Status post total replacement of right hip 02/23/2018  . Cellulitis 12/28/2017  . Unilateral primary osteoarthritis, right hip 12/14/2017  . Type 2 diabetes mellitus with diabetic neuropathy ([place]) 06/08/2017  . Neuropathic pain 03/30/2017  . Seasonal allergies 03/30/2017  . Lumbar scoliosis 08/12/2016  . Controlled diabetes mellitus type 2 with complications ([place]) 34/74/2595  . Scoliosis 11/24/2015  . Low back pain 09/29/2014  . Diabetes mellitus with peripheral vascular disease ([place]) 04/10/2013  . Left carotid bruit 04/10/2013  . Anemia 03/31/2013  . Degenerative arthritis of hip 06/01/2012  . POSTMENOPAUSAL SYNDROME 09/16/2009  . ARTHRALGIA 09/16/2009  . Seasonal and perennial allergic rhinitis 10/08/2007  . Osteopenia 08/06/2007  . ELEVATED BLOOD PRESSURE WITHOUT DIAGNOSIS OF HYPERTENSION 08/06/2007  . HYPERLIPIDEMIA 05/14/2007  . Asthma, mild intermittent 05/14/2007  . History of pulmonary embolus (PE) 08/31/2006    Past Surgical History:  Procedure Laterality Date  . ABDOMINAL EXPOSURE N/A 08/12/2016   Procedure: ABDOMINAL EXPOSURE;  Surgeon: Rosetta Posner, MD;  Location: [facility] OR;  Service: Vascular;  Laterality: N/A;  . ANTERIOR LAT LUMBAR FUSION N/A 08/12/2016   Procedure: LUMBAR TWO-THREE, LUMBAR THREE-FOUR,  LUMBAR FOUR-FIVE  ANTEROLATERAL LUMBAR INTERBODY FUSION;  Surgeon: Erline Levine, MD;  Location: [facility] [facility];  Service: Neurosurgery;  Laterality: N/A;  L2-3 L3-4 L4-5 Anterolateral lumbar interbody fusion  . ANTERIOR LUMBAR FUSION N/A 08/12/2016   Procedure: Lumbar Five-Sacral One Anterior lumbar interbody fusion with Dr. Sherren Mocha Early to assist;  Surgeon: Erline Levine, MD;  Location: [place];   Service: Neurosurgery;  Laterality: N/A;  L5-S1 Anterior lumbar interbody fusion with Dr. Sherren Mocha Early to assist  . BUNIONECTOMY    . CERVICAL FUSION  2006   Dr Annette Stable, NS;post op hematoma & cns leak & urinary retention  . COLONOSCOPY  1992 & 2002   negative  . epidural steroids  2006   cervical spine  . LUMBAR PERCUTANEOUS PEDICLE SCREW 4 LEVEL N/A 08/12/2016   Procedure: LUMBAR TWO-SACRAL ONE Percuataneous Pedicle Screws;  Surgeon: Erline Levine, MD;  Location: [facility];  Service: Neurosurgery;  Laterality: N/A;  . ROTATOR CUFF REPAIR  2009   Right  . SEPTOPLASTY    . TONSILLECTOMY  78 years old  . TOTAL HIP ARTHROPLASTY  06/01/2012   Procedure: TOTAL HIP ARTHROPLASTY ANTERIOR APPROACH;  Surgeon: Mcarthur Rossetti, MD;  Location: [facility];  Service: Orthopedics;  Laterality: Left;  Left Total Hip Arthroplasty  . TOTAL HIP ARTHROPLASTY Right 02/23/2018   Procedure: RIGHT TOTAL HIP ARTHROPLASTY ANTERIOR APPROACH;  Surgeon: Mcarthur Rossetti, MD;  Location: [facility];  Service: Orthopedics;  Laterality: Right;  . TUBAL LIGATION       OB History   No obstetric history on file.      Home Medications    Prior to Admission medications   Medication Sig Start Date End Date Taking? Authorizing Provider  acetaminophen (TYLENOL) 650 MG CR tablet Take 650-1,300 mg by mouth 2 (two) times daily as needed for pain.   Yes [provider]  albuterol (PROAIR HFA) 108 (90 Base) MCG/ACT inhaler Inhale 2 puffs into the lungs every 6 (six) hours as needed for wheezing or shortness of breath. 05/25/16  Yes Young, Tarri Fuller D, MD  alendronate (FOSAMAX) 70 MG tablet TAKE 1 TABLET(70 MG) BY MOUTH EVERY 7 DAYS WITH A FULL GLASS OF WATER AND ON AN EMPTY STOMACH Patient taking differently: Take 70 mg by mouth once a week.  01/19/18  Yes [name], DO  aspirin 81 MG chewable tablet Chew 1 tablet (81 mg total) by mouth 2 (two) times daily. Patient taking differently: Chew 81 mg by mouth daily.   02/26/18  Yes Mcarthur Rossetti, MD  Azelastine-Fluticasone [facility]) 137-50 MCG/ACT SUSP Place 2 sprays into the nose 2 (two) times daily as needed (congestion).    Yes [provider]  Calcium Citrate-Vitamin D (CALCIUM + D PO) Take 1 tablet by mouth daily.   Yes [provider]  celecoxib (CELEBREX) 200 MG capsule TAKE 1 CAPSULE(200 MG) BY MOUTH DAILY Patient taking differently: Take 200 mg by mouth daily as needed for mild pain (knee pain).  12/28/17  Yes Pete Pelt, PA-C  cholecalciferol (VITAMIN D) 1000 UNITS tablet Take 1,000 Units by mouth daily.    Yes [provider]  cyclobenzaprine (FLEXERIL) 10 MG tablet TAKE 1 TABLET(10 MG) BY MOUTH THREE TIMES DAILY AS NEEDED FOR MUSCLE SPASMS Patient taking differently: Take 10 mg by mouth 3 (three) times daily as needed for muscle spasms.  06/29/18  Yes [name] R, DO  ezetimibe (ZETIA) 10 MG tablet Take 1 tablet (10 mg total) by mouth daily. 03/30/18  Yes Carollee Herter, Kendrick Fries R, DO  ferrous sulfate 325 (65 FE) MG tablet Take 1 tablet (325 mg total) by mouth daily with breakfast. 03/30/18  Yes Lowne Chase, Yvonne R, DO  Fluticasone-Salmeterol (ADVAIR DISKUS) 250-50 MCG/DOSE AEPB USE 1 INHALATION TWO TIMES  DAILY ; RINSE MOUTH Patient taking differently: Inhale 1 puff into the lungs 2 (two) times daily. USE 1 INHALATION TWO TIMES  DAILY ; RINSE MOUTH 05/25/17  Yes Young, Clinton D, MD  Lidocaine (ASPERCREME LIDOCAINE) 4 % PTCH Apply 1 patch topically as needed (back pain).   Yes [provider]  metFORMIN (GLUCOPHAGE-XR) 500 MG 24 hr tablet TAKE 1 TABLET(500 MG) BY MOUTH DAILY WITH BREAKFAST Patient taking differently: Take 500 mg by mouth daily with breakfast.  01/19/18  Yes [name], DO  Multiple Vitamin (MULTIVITAMIN WITH MINERALS) TABS tablet Take 1 tablet by mouth daily.   Yes [provider]  Multiple Vitamins-Minerals (OCUVITE EXTRA PO) Take 1 tablet by mouth daily.   Yes  [provider]  mupirocin ointment (BACTROBAN) 2 % Apply 1 application topically daily as needed (hip immflamation).    Yes [provider]  MYRBETRIQ 50 MG TB24 tablet TAKE 1 TABLET(50 MG) BY MOUTH DAILY Patient taking differently: Take 50 mg by mouth daily as needed (cellulitis).  01/19/18  Yes [name] R, DO  Polyethyl Glycol-Propyl Glycol (SYSTANE OP) Place 1 drop into both eyes daily as needed (dry eyes).   Yes [provider]  polyethylene glycol (MIRALAX / GLYCOLAX) packet Take 17 g by mouth daily as needed for mild constipation.    Yes [provider]  pregabalin (LYRICA) 100 MG capsule Take 1 capsule (100 mg total) by mouth 3 (three) times daily. 01/15/18  Yes [name] R, DO  traMADol (ULTRAM) 50 MG tablet TAKE 1 TABLET BY MOUTH EVERY 6 HOURS AS NEEDED Patient taking differently: Take 50 mg by mouth every 6 (six) hours as needed for moderate pain.  05/16/18  Yes [name], DO  Trolamine Salicylate (ASPERCREME EX) Apply 1 application topically daily as needed (back pain).   Yes [provider]    Family History Family History  Problem Relation Age of Onset  . COPD Father        emphysema  . Cancer Mother        cns cancer  . Diabetes Sister        TWIN sister ; also Fibromyalgia ; S/P stent 2004  . Heart disease Sister        stents @ 40 & 60  . Stroke Maternal Grandmother        in  late 56s  . Transient ischemic attack Paternal Aunt     Social History Social History   Tobacco Use  . Smoking status: Never Smoker  . Smokeless tobacco: Never Used  Substance Use Topics  . Alcohol use: Yes    Alcohol/week: 1.0 standard drinks    Types: 1 Glasses of wine per week    Comment: socially  . Drug use: No     Allergies   Statins   Review of Systems Review of Systems  Cardiovascular: Positive for chest pain.  All other systems reviewed and are negative.    Physical Exam Updated Vital  Signs BP 129/67   Pulse 80   Temp 98.9 F (37.2 C) (Oral)   Resp 12   Ht 5\' 3"  (1.6 m)   Wt 66.7 kg   SpO2 99%  BMI 26.04 kg/m   Physical Exam Constitutional:      Appearance: She is well-developed.     Comments: NAD. Non toxic.   HENT:     Head: Normocephalic and atraumatic.     Nose: Nose normal.  Eyes:     General: Lids are normal.     Conjunctiva/sclera: Conjunctivae normal.  Neck:     Musculoskeletal: Normal range of motion.     Trachea: Trachea normal.     Comments: Trachea midline.  Cardiovascular:     Rate and Rhythm: Normal rate and regular rhythm.     Pulses:          Carotid pulses are 2+ on the right side and 2+ on the left side.      Radial pulses are 2+ on the right side and 2+ on the left side.       Dorsalis pedis pulses are 2+ on the right side and 2+ on the left side.     Heart sounds: Normal heart sounds, S1 normal and S2 normal.     Comments: No LE edema or calf tenderness.  No reproducible chest wall tenderness. Pulmonary:     Effort: Pulmonary effort is normal.     Breath sounds: Decreased breath sounds present.     Comments: Diminished lung sounds to lower lobes posteriorly left greater than right.  No wheezing, crackles.  Pain noted with deep inspiration and movement during exam. Abdominal:     General: Bowel sounds are normal.     Palpations: Abdomen is soft.     Tenderness: There is no abdominal tenderness.     Comments: No epigastric tenderness. No distention.   Skin:    General: Skin is warm and dry.     Capillary Refill: Capillary refill takes less than 2 seconds.     Comments: No rash to chest wall  Neurological:     Mental Status: She is alert.     GCS: GCS eye subscore is 4. GCS verbal subscore is 5. GCS motor subscore is 6.  Psychiatric:        Speech: Speech normal.        Behavior: Behavior normal.        Thought Content: Thought content normal.      ED Treatments / Results  Labs (all labs ordered are listed, but only  abnormal results are displayed) Labs Reviewed  BASIC METABOLIC PANEL - Abnormal; Notable for the following components:      Result Value   Glucose, Bld 133 (*)    All other components within normal limits  CBC - Abnormal; Notable for the following components:   Hemoglobin 11.8 (*)    RDW 16.2 (*)    All other components within normal limits  D-DIMER, QUANTITATIVE (NOT AT [facility]) - Abnormal; Notable for the following components:   D-Dimer, Quant 1.82 (*)    All other components within normal limits  CULTURE, BLOOD (ROUTINE X 2)  CULTURE, BLOOD (ROUTINE X 2)  EXPECTORATED SPUTUM ASSESSMENT W REFEX TO RESP CULTURE  GRAM STAIN  URINE CULTURE  HIV ANTIBODY (ROUTINE TESTING W REFLEX)  STREP PNEUMONIAE URINARY ANTIGEN  LEGIONELLA PNEUMOPHILA SEROGP 1 UR AG  URINALYSIS, ROUTINE W REFLEX MICROSCOPIC  I-STAT TROPONIN, ED  I-STAT TROPONIN, ED    EKG None  Radiology Dg Chest 2 View  Result Date: 07/06/2018 CLINICAL DATA:  Chest pain.  Cough. EXAM: CHEST - 2 VIEW COMPARISON:  Chest radiograph 10/17/2016 FINDINGS: Monitoring leads overlie the patient. Normal  cardiac and mediastinal contours. Anterior cervical spinal fusion hardware. Biapical pleuroparenchymal thickening. New small left pleural effusion and underlying opacities. No pneumothorax. Thoracic spine degenerative changes. IMPRESSION: New small left pleural effusion with underlying opacities which may represent atelectasis or infection. Followup PA and lateral chest X-ray is recommended in 3-4 weeks following trial of antibiotic therapy to ensure resolution and exclude underlying malignancy. Electronically Signed   By: Lovey Newcomer M.D.   On: 07/06/2018 13:40   Ct Angio Chest Pe W And/or Wo Contrast  Result Date: 07/06/2018 CLINICAL DATA:  Left pleuritic chest pain, remote history of PE, recent right hip replacement surgery EXAM: CT ANGIOGRAPHY CHEST WITH CONTRAST TECHNIQUE: Multidetector CT imaging of the chest was performed using the  standard protocol during bolus administration of intravenous contrast. Multiplanar CT image reconstructions and MIPs were obtained to evaluate the vascular anatomy. CONTRAST:  166mL ISOVUE-370 IOPAMIDOL (ISOVUE-370) INJECTION 76% COMPARISON:  11/13/2004 FINDINGS: Cardiovascular: Heart size normal. No pericardial effusion. Satisfactory opacification of pulmonary arteries noted, and there is no evidence of pulmonary emboli. Scattered coronary calcifications. Adequate contrast opacification of the thoracic aorta with no evidence of dissection, aneurysm, or stenosis. There is classic 3-vessel brachiocephalic arch anatomy without proximal stenosis. Patchy calcified plaque in the distal arch, and visualized proximal abdominal segment. Mediastinum/Nodes: No hilar or mediastinal adenopathy. Lungs/Pleura: Trace pleural effusions left greater than right. No pneumothorax. Airspace consolidation in the anterior basal segment left lower lobe. Patchy atelectasis or infiltrate in the inferior lingula and medial right middle lobe. Minimal dependent atelectasis posteriorly in both lower lobes. Upper Abdomen: No acute findings Musculoskeletal: Cervical fixation hardware partially visualized. No fracture or worrisome bone lesion. Review of the MIP images confirms the above findings. IMPRESSION: 1. Negative for acute PE or thoracic aortic dissection. 2. Left lower lobe pneumonia with small pleural effusions. 3. Atherosclerosis, including aortic and coronary artery disease. Please note that although the presence of coronary artery calcium documents the presence of coronary artery disease, the severity of this disease and any potential stenosis cannot be assessed on this non-gated CT examination. Assessment for potential risk factor modification, dietary therapy or pharmacologic therapy may be warranted, if clinically indicated. Electronically Signed   By: Lucrezia Europe M.D.   On: 07/06/2018 16:07    Procedures .Critical Care Performed  by: Kinnie Feil, PA-C Authorized by: Kinnie Feil, PA-C   Critical care provider statement:    Critical care time (minutes):  45   Critical care was necessary to treat or prevent imminent or life-threatening deterioration of the following conditions:  Respiratory failure   Critical care was time spent personally by me on the following activities:  Discussions with consultants, evaluation of patient's response to treatment, examination of patient, ordering and performing treatments and interventions, ordering and review of laboratory studies, ordering and review of radiographic studies, pulse oximetry, re-evaluation of patient's condition, obtaining history from patient or surrogate, review of old charts and development of treatment plan with patient or surrogate   I assumed direction of critical care for this patient from another provider in my specialty: no     (including critical care time)  Medications Ordered in ED Medications  sodium chloride (PF) 0.9 % injection (has no administration in time range)  iopamidol (ISOVUE-370) 76 % injection (has no administration in time range)  cefTRIAXone (ROCEPHIN) 2 g in sodium chloride 0.9 % 100 mL IVPB (has no administration in time range)  azithromycin (ZITHROMAX) 500 mg in sodium chloride 0.9 % 250 mL IVPB (has  no administration in time range)  cefTRIAXone (ROCEPHIN) 1 g in sodium chloride 0.9 % 100 mL IVPB (has no administration in time range)  azithromycin (ZITHROMAX) 500 mg in sodium chloride 0.9 % 250 mL IVPB (has no administration in time range)  sodium chloride flush (NS) 0.9 % injection 3 mL (3 mLs Intravenous Given 07/06/18 1347)  iopamidol (ISOVUE-370) 76 % injection 100 mL (100 mLs Intravenous Contrast Given 07/06/18 1552)     Initial Impression / Assessment and Plan / ED Course  I have reviewed the triage vital signs and the nursing notes.  Pertinent labs & imaging results that were available during my care of the patient  were reviewed by me and considered in my medical decision making (see chart for details).  Clinical Course as of Jul 07 1655  Fri Jul 06, 2018  1532 Called CT regarding delay. She is up next   [CG]  1541 IMPRESSION: New small left pleural effusion with underlying opacities which may represent atelectasis or infection. Followup PA and lateral chest X-ray is recommended in 3-4 weeks following trial of antibiotic therapy to ensure resolution and exclude underlying malignancy.  DG Chest 2 View [CG]  1655  IMPRESSION: 1. Negative for acute PE or thoracic aortic dissection. 2. Left lower lobe pneumonia with small pleural effusions. 3. Atherosclerosis, including aortic and coronary artery disease. Please note that although the presence of coronary artery calcium documents the presence of coronary artery disease, the severity of this disease and any potential stenosis cannot be assessed on this non-gated CT examination. Assessment for potential risk factor modification, dietary therapy or pharmacologic therapy may be warranted, if clinically indicated.  CT Angio Chest PE W and/or Wo Contrast [CG]    Clinical Course User Index [CG] Kinnie Feil, PA-C    ddx includes PE vs PNA given preceding surgery and URI symptoms. No trauma or exertional activity. ACS considered given cardiac risk factors including age, HLD, DM, PVD although pain is pleuritic and positional not exertional.  Afebrile w/o tachycardia, tachypnea, hypoxia. She looks uncomfortable and taking shallow breaths due to inspiratory pain. Labs, imaging, d-dimer pending.    1654: Work-up remarkable for left lower lobe pneumonia with pleural effusions left greater than right.  At rest vital signs are WNL however I ambulated patient and her SPO2 dropped to 84% with less than 10 feet ambulation.  She sat down on the bed and started pursed lip breathing and reported lightheadedness.  PSI/port score used in MDM.  Given her age,  desaturation, risk I spoke to hospitalist who will admit patient for community-acquired pneumonia and exertional hypoxemia.  I discussed this with patient and family and they are comfortable with the plan.  Final Clinical Impressions(s) / ED Diagnoses   Final diagnoses:  Community acquired pneumonia of left lower lobe of lung [facility])  Pleural effusion    ED Discharge Orders    None       Arlean Hopping 07/06/18 1657    Mesner, Corene Cornea, MD 07/15/18 2038

## 2018-07-06 NOTE — ED Triage Notes (Addendum)
Pt states that she is having some left sided chest pain which started on her back. Pt states that she had food poisoning last week, and hasn't been eating much.  Pt states she was evaluated by EMS and staff at [facility] and was cleared, but wanted to be seen. Pt states pain is worse with movement.

## 2018-07-06 NOTE — H&P (Signed)
History and Physical    [name]:[id number] DOB: 11-29-1940 DOA: 07/06/2018  PCP: [name], DO  Patient coming from: Home  I have personally briefly reviewed patient's old medical records in [place]  Chief Complaint: Chest pain  HPI: [name] is a 78 y.o. female with medical history significant of hyperlipidemia, well-controlled type 2 diabetes mellitus, prior history of PE no longer on anticoagulation, arthritis, asthma who presents to the ED with a 1 to 2-day history of left-sided pleuritic chest pain.  Patient stated that 2 days prior to admission had back pain that subsequently moved to the anterior portion of the chest.  Patient states has pain with movement.  Patient describes the pain as an ache and an intense pain that is intermittent in nature and lasts a few seconds and then goes away.  Patient does endorse symptoms of shortness of breath, not feeling well, multiple hours of emesis, nausea, weakness 1 week prior to admission which has since improved.  Patient also does endorse a productive cough of sputum x1 episode 1 day prior to admission.  Patient endorses lightheadedness and weakness as well as chills.  Patient denies any abdominal pain, no diarrhea, no constipation, no dysuria, no melena, no hematemesis, no hematochezia, no asymmetric weakness or numbness, no significant visual changes, no wheezing.  Patient also with some complaints of shortness of breath.  ED Course: Patient seen in the ED d-dimer noted to be elevated at 1.82.  Basic metabolic profile obtained unremarkable except a glucose of 133.  Point-of-care troponin was negative.  CBC unremarkable.  Chest x-ray done with new small left pleural effusion with underlying opacities which may represent atelectasis or infection.  CT angiogram chest which was done was negative for acute PE or thoracic aortic dissection.  Left lower lobe pneumonia with small pleural effusions were noted on CT chest.  Patient  given a dose of IV Rocephin and azithromycin in the ED.  Blood cultures were ordered.  The ED PA patient was ambulated in the hallway and noted to desat to 84% on room air on ambulation.  Due to patient's hypoxia hospitalist were called to admit the patient for further evaluation and management.  Review of Systems: As per HPI otherwise 10 point review of systems negative.   Past Medical History:  Diagnosis Date  . Anemia   . Arthritis   . Asthma    adult onset  . Basal cell cancer    LUE; Porokeratosis also  . Chronic kidney disease 1963   strep in kidney due to strep throat-hospitalized 10 days  . Complication of anesthesia    small trachea  . Diabetes mellitus 2010   A1c 6.7%  . DVT (deep venous thrombosis) ([place]) 2006   post immobilization post cns surgery  . GERD (gastroesophageal reflux disease)    very mild  . Granulomatous lung disease ([place]) 2002   incidental Xray finding  . Hyperlipidemia   . Hypertension 2004   Hypertensive response on Stress Test  . Paralysis ([place]) 2006   post cervical fusion with spinal sac tear  with hematoma   . PTE (pulmonary thromboembolism) ([place]) 2006    Past Surgical History:  Procedure Laterality Date  . ABDOMINAL EXPOSURE N/A 08/12/2016   Procedure: ABDOMINAL EXPOSURE;  Surgeon: Rosetta Posner, MD;  Location: [facility];  Service: Vascular;  Laterality: N/A;  . ANTERIOR LAT LUMBAR FUSION N/A 08/12/2016   Procedure: LUMBAR TWO-THREE, LUMBAR THREE-FOUR, LUMBAR FOUR-FIVE  ANTEROLATERAL LUMBAR  INTERBODY FUSION;  Surgeon: Erline Levine, MD;  Location: [facility];  Service: Neurosurgery;  Laterality: N/A;  L2-3 L3-4 L4-5 Anterolateral lumbar interbody fusion  . ANTERIOR LUMBAR FUSION N/A 08/12/2016   Procedure: Lumbar Five-Sacral One Anterior lumbar interbody fusion with Dr. Sherren Mocha Early to assist;  Surgeon: Erline Levine, MD;  Location: [facility];  Service: Neurosurgery;  Laterality: N/A;  L5-S1 Anterior lumbar interbody fusion with Dr. Sherren Mocha Early to assist  .  BUNIONECTOMY    . CERVICAL FUSION  2006   Dr Annette Stable, NS;post op hematoma & cns leak & urinary retention  . COLONOSCOPY  1992 & 2002   negative  . epidural steroids  2006   cervical spine  . LUMBAR PERCUTANEOUS PEDICLE SCREW 4 LEVEL N/A 08/12/2016   Procedure: LUMBAR TWO-SACRAL ONE Percuataneous Pedicle Screws;  Surgeon: Erline Levine, MD;  Location: [facility];  Service: Neurosurgery;  Laterality: N/A;  . ROTATOR CUFF REPAIR  2009   Right  . SEPTOPLASTY    . TONSILLECTOMY  78 years old  . TOTAL HIP ARTHROPLASTY  06/01/2012   Procedure: TOTAL HIP ARTHROPLASTY ANTERIOR APPROACH;  Surgeon: Mcarthur Rossetti, MD;  Location: [facility];  Service: Orthopedics;  Laterality: Left;  Left Total Hip Arthroplasty  . TOTAL HIP ARTHROPLASTY Right 02/23/2018   Procedure: RIGHT TOTAL HIP ARTHROPLASTY ANTERIOR APPROACH;  Surgeon: Mcarthur Rossetti, MD;  Location: [facility];  Service: Orthopedics;  Laterality: Right;  . TUBAL LIGATION       reports that she has never smoked. She has never used smokeless tobacco. She reports current alcohol use of about 1.0 standard drinks of alcohol per week. She reports that she does not use drugs.  Allergies  Allergen Reactions  . Statins Other (See Comments)    Myalgias and muscle weakness    Family History  Problem Relation Age of Onset  . COPD Father        emphysema  . Cancer Mother        cns cancer  . Diabetes Sister        TWIN sister ; also Fibromyalgia ; S/P stent 2004  . Heart disease Sister        stents @ 62 & 42  . Stroke Maternal Grandmother        in  late 92s  . Transient ischemic attack Paternal Aunt    Mother deceased age 63 from brain cancer.  Father deceased age 58 with COPD.  Prior to Admission medications   Medication Sig Start Date End Date Taking? Authorizing Provider  acetaminophen (TYLENOL) 650 MG CR tablet Take 650-1,300 mg by mouth 2 (two) times daily as needed for pain.   Yes [provider]  albuterol (PROAIR HFA) 108 (90  Base) MCG/ACT inhaler Inhale 2 puffs into the lungs every 6 (six) hours as needed for wheezing or shortness of breath. 05/25/16  Yes Young, Tarri Fuller D, MD  alendronate (FOSAMAX) 70 MG tablet TAKE 1 TABLET(70 MG) BY MOUTH EVERY 7 DAYS WITH A FULL GLASS OF WATER AND ON AN EMPTY STOMACH Patient taking differently: Take 70 mg by mouth once a week.  01/19/18  Yes [name], DO  aspirin 81 MG chewable tablet Chew 1 tablet (81 mg total) by mouth 2 (two) times daily. Patient taking differently: Chew 81 mg by mouth daily.  02/26/18  Yes Mcarthur Rossetti, MD  Azelastine-Fluticasone [facility]) 137-50 MCG/ACT SUSP Place 2 sprays into the nose 2 (two) times daily as needed (congestion).    Yes  [provider]  Calcium Citrate-Vitamin D (CALCIUM + D PO) Take 1 tablet by mouth daily.   Yes [provider]  celecoxib (CELEBREX) 200 MG capsule TAKE 1 CAPSULE(200 MG) BY MOUTH DAILY Patient taking differently: Take 200 mg by mouth daily as needed for mild pain (knee pain).  12/28/17  Yes Pete Pelt, PA-C  cholecalciferol (VITAMIN D) 1000 UNITS tablet Take 1,000 Units by mouth daily.    Yes [provider]  cyclobenzaprine (FLEXERIL) 10 MG tablet TAKE 1 TABLET(10 MG) BY MOUTH THREE TIMES DAILY AS NEEDED FOR MUSCLE SPASMS Patient taking differently: Take 10 mg by mouth 3 (three) times daily as needed for muscle spasms.  06/29/18  Yes [name] R, DO  ezetimibe (ZETIA) 10 MG tablet Take 1 tablet (10 mg total) by mouth daily. 03/30/18  Yes [name] R, DO  ferrous sulfate 325 (65 FE) MG tablet Take 1 tablet (325 mg total) by mouth daily with breakfast. 03/30/18  Yes Lowne Chase, Yvonne R, DO  Fluticasone-Salmeterol (ADVAIR DISKUS) 250-50 MCG/DOSE AEPB USE 1 INHALATION TWO TIMES  DAILY ; RINSE MOUTH Patient taking differently: Inhale 1 puff into the lungs 2 (two) times daily. USE 1 INHALATION TWO TIMES  DAILY ; RINSE MOUTH 05/25/17  Yes Young, Clinton D, MD   Lidocaine (ASPERCREME LIDOCAINE) 4 % PTCH Apply 1 patch topically as needed (back pain).   Yes [provider]  metFORMIN (GLUCOPHAGE-XR) 500 MG 24 hr tablet TAKE 1 TABLET(500 MG) BY MOUTH DAILY WITH BREAKFAST Patient taking differently: Take 500 mg by mouth daily with breakfast.  01/19/18  Yes [name], DO  Multiple Vitamin (MULTIVITAMIN WITH MINERALS) TABS tablet Take 1 tablet by mouth daily.   Yes [provider]  Multiple Vitamins-Minerals (OCUVITE EXTRA PO) Take 1 tablet by mouth daily.   Yes [provider]  mupirocin ointment (BACTROBAN) 2 % Apply 1 application topically daily as needed (hip immflamation).    Yes [provider]  MYRBETRIQ 50 MG TB24 tablet TAKE 1 TABLET(50 MG) BY MOUTH DAILY Patient taking differently: Take 50 mg by mouth daily as needed (cellulitis).  01/19/18  Yes [name] R, DO  Polyethyl Glycol-Propyl Glycol (SYSTANE OP) Place 1 drop into both eyes daily as needed (dry eyes).   Yes [provider]  polyethylene glycol (MIRALAX / GLYCOLAX) packet Take 17 g by mouth daily as needed for mild constipation.    Yes [provider]  pregabalin (LYRICA) 100 MG capsule Take 1 capsule (100 mg total) by mouth 3 (three) times daily. 01/15/18  Yes [name] R, DO  traMADol (ULTRAM) 50 MG tablet TAKE 1 TABLET BY MOUTH EVERY 6 HOURS AS NEEDED Patient taking differently: Take 50 mg by mouth every 6 (six) hours as needed for moderate pain.  05/16/18  Yes [name], DO  Trolamine Salicylate (ASPERCREME EX) Apply 1 application topically daily as needed (back pain).   Yes [provider]    Physical Exam: Vitals:   07/06/18 1400 07/06/18 1630 07/06/18 1700 07/06/18 1710  BP:    (!) 169/74  Pulse:      Resp: 12 14 13 17   Temp:      TempSrc:      SpO2: 99% 100% 96% 98%  Weight:      Height:        Constitutional: NAD, calm, comfortable. Vitals:   07/06/18 1400 07/06/18  1630 07/06/18 1700 07/06/18 1710  BP:    [name])  169/74  Pulse:      Resp: 12 14 13 17   Temp:      TempSrc:      SpO2: 99% 100% 96% 98%  Weight:      Height:       Eyes: PERRLA, lids and conjunctivae normal ENMT: Mucous membranes are dry. Posterior pharynx clear of any exudate or lesions.Normal dentition.  Neck: normal, supple, no masses, no thyromegaly Respiratory: clear to auscultation bilaterally, no wheezing, no crackles. Normal respiratory effort. No accessory muscle use.  Cardiovascular: Regular rate and rhythm, no murmurs / rubs / gallops. No extremity edema. 2+ pedal pulses. No carotid bruits.  Abdomen: no tenderness, no masses palpated. No hepatosplenomegaly. Bowel sounds positive.  Musculoskeletal: no clubbing / cyanosis. No joint deformity upper and lower extremities. Good ROM, no contractures. Normal muscle tone.  Skin: no rashes, lesions, ulcers. No induration.  Decreased skin turgor. Neurologic: CN 2-12 grossly intact. Sensation intact, DTR normal. Strength 5/5 in all 4.  Psychiatric: Normal judgment and insight. Alert and oriented x 3. Normal mood.   Labs on Admission: I have personally reviewed following labs and imaging studies  CBC: Recent Labs  Lab 07/06/18 1328  WBC 9.4  HGB 11.8*  HCT 36.8  MCV 88.5  PLT 937   Basic Metabolic Panel: Recent Labs  Lab 07/06/18 1328  NA 138  K 3.6  CL 105  CO2 25  GLUCOSE 133*  BUN 8  CREATININE 0.81  CALCIUM 9.0   GFR: Estimated Creatinine Clearance: 53.3 mL/min (by C-G formula based on SCr of 0.81 mg/dL). Liver Function Tests: No results for input(s): AST, ALT, ALKPHOS, BILITOT, PROT, ALBUMIN in the last 168 hours. No results for input(s): LIPASE, AMYLASE in the last 168 hours. No results for input(s): AMMONIA in the last 168 hours. Coagulation Profile: No results for input(s): INR, PROTIME in the last 168 hours. Cardiac Enzymes: No results for input(s): CKTOTAL, CKMB, CKMBINDEX, TROPONINI in the last 168  hours. BNP (last 3 results) No results for input(s): PROBNP in the last 8760 hours. HbA1C: No results for input(s): HGBA1C in the last 72 hours. CBG: No results for input(s): GLUCAP in the last 168 hours. Lipid Profile: No results for input(s): CHOL, HDL, LDLCALC, TRIG, CHOLHDL, LDLDIRECT in the last 72 hours. Thyroid Function Tests: No results for input(s): TSH, T4TOTAL, FREET4, T3FREE, THYROIDAB in the last 72 hours. Anemia Panel: No results for input(s): VITAMINB12, FOLATE, FERRITIN, TIBC, IRON, RETICCTPCT in the last 72 hours. Urine analysis:    Component Value Date/Time   COLORURINE COLORLESS (A) 12/28/2017 1328   APPEARANCEUR CLEAR 12/28/2017 1328   LABSPEC 1.003 (L) 12/28/2017 1328   PHURINE 7.0 12/28/2017 1328   GLUCOSEU NEGATIVE 12/28/2017 1328   GLUCOSEU NEGATIVE 06/03/2016 1213   HGBUR NEGATIVE 12/28/2017 1328   BILIRUBINUR neg 01/15/2018 1218   KETONESUR 5 (A) 12/28/2017 1328   PROTEINUR Negative 01/15/2018 1218   PROTEINUR NEGATIVE 12/28/2017 1328   UROBILINOGEN 0.2 01/15/2018 1218   UROBILINOGEN 0.2 06/03/2016 1213   NITRITE neg 01/15/2018 1218   NITRITE NEGATIVE 12/28/2017 1328   LEUKOCYTESUR Negative 01/15/2018 1218    Radiological Exams on Admission: Dg Chest 2 View  Result Date: 07/06/2018 CLINICAL DATA:  Chest pain.  Cough. EXAM: CHEST - 2 VIEW COMPARISON:  Chest radiograph 10/17/2016 FINDINGS: Monitoring leads overlie the patient. Normal cardiac and mediastinal contours. Anterior cervical spinal fusion hardware. Biapical pleuroparenchymal thickening. New small left pleural effusion and underlying opacities. No pneumothorax. Thoracic spine degenerative changes. IMPRESSION: New small  left pleural effusion with underlying opacities which may represent atelectasis or infection. Followup PA and lateral chest X-ray is recommended in 3-4 weeks following trial of antibiotic therapy to ensure resolution and exclude underlying malignancy. Electronically Signed   By: Lovey Newcomer M.D.   On: 07/06/2018 13:40   Ct Angio Chest Pe W And/or Wo Contrast  Result Date: 07/06/2018 CLINICAL DATA:  Left pleuritic chest pain, remote history of PE, recent right hip replacement surgery EXAM: CT ANGIOGRAPHY CHEST WITH CONTRAST TECHNIQUE: Multidetector CT imaging of the chest was performed using the standard protocol during bolus administration of intravenous contrast. Multiplanar CT image reconstructions and MIPs were obtained to evaluate the vascular anatomy. CONTRAST:  14mL ISOVUE-370 IOPAMIDOL (ISOVUE-370) INJECTION 76% COMPARISON:  11/13/2004 FINDINGS: Cardiovascular: Heart size normal. No pericardial effusion. Satisfactory opacification of pulmonary arteries noted, and there is no evidence of pulmonary emboli. Scattered coronary calcifications. Adequate contrast opacification of the thoracic aorta with no evidence of dissection, aneurysm, or stenosis. There is classic 3-vessel brachiocephalic arch anatomy without proximal stenosis. Patchy calcified plaque in the distal arch, and visualized proximal abdominal segment. Mediastinum/Nodes: No hilar or mediastinal adenopathy. Lungs/Pleura: Trace pleural effusions left greater than right. No pneumothorax. Airspace consolidation in the anterior basal segment left lower lobe. Patchy atelectasis or infiltrate in the inferior lingula and medial right middle lobe. Minimal dependent atelectasis posteriorly in both lower lobes. Upper Abdomen: No acute findings Musculoskeletal: Cervical fixation hardware partially visualized. No fracture or worrisome bone lesion. Review of the MIP images confirms the above findings. IMPRESSION: 1. Negative for acute PE or thoracic aortic dissection. 2. Left lower lobe pneumonia with small pleural effusions. 3. Atherosclerosis, including aortic and coronary artery disease. Please note that although the presence of coronary artery calcium documents the presence of coronary artery disease, the severity of this disease and  any potential stenosis cannot be assessed on this non-gated CT examination. Assessment for potential risk factor modification, dietary therapy or pharmacologic therapy may be warranted, if clinically indicated. Electronically Signed   By: Lucrezia Europe M.D.   On: 07/06/2018 16:07    EKG: Independently reviewed.  Normal sinus rhythm  Assessment/Plan Principal Problem:   CAP (community acquired pneumonia) Active Problems:   HYPERLIPIDEMIA   Asthma, mild intermittent   ELEVATED BLOOD PRESSURE WITHOUT DIAGNOSIS OF HYPERTENSION   Degenerative arthritis of hip   Anemia   Diabetes mellitus with peripheral vascular disease (HCC)   Seasonal allergies   Status post total replacement of right hip   Pleuritic chest pain   1 community-acquired pneumonia left lower lobe with small left pleural effusion Patient presented with pleuritic chest pain and noted to have elevated d-dimer.  Chest x-ray which was done showed a small left pleural effusion and concerns for left basilar infiltrate versus atelectasis.  CT angiogram chest was done which was consistent with a left lower lobe pneumonia with small pleural effusion.  ED PA tried ambulating the patient however patient noted to be hypoxic with sats of 84% on room air.  Due to patient's hypoxia and age hospitalist were asked to admit patient for further evaluation and management.  Blood cultures have been obtained in the ED.  Check a sputum Gram stain and culture.  Check a urine Legionella antigen.  Check a urine pneumococcus antigen.  Will place empirically on IV Rocephin and IV azithromycin, Mucinex, scheduled Xopenex nebulizers, Flonase, Claritin.  Supportive care.  Repeat ambulatory sats in the morning.  Follow.  2.  Pleuritic chest pain Likely secondary to  problem #1.  D-dimer was elevated however CT angiogram chest was negative for PE however consistent with left lower lobe pneumonia.  Patient states has chest pain also with movement.  Will place patient on  scheduled ibuprofen.  Incentive spirometry.  Supportive care.  Follow.  3.  Diabetes mellitus type 2 Well-controlled.  Check a hemoglobin A1c.  Hold oral hypoglycemic agents.  Placed on sliding scale insulin.  4.  Seasonal allergies Claritin.  5.  Status post total hip replacement right PT/OT.  6.  Hyperlipidemia Continue Zetia.  7.  Asthma Stable.  Placed on Dulera.  Scheduled Xopenex nebs.  Supportive care.  Follow.  DVT prophylaxis: Lovenox Code Status: DNR Family Communication: Updated patient, and daughter at bedside. Disposition Plan: Likely home when patient has improved clinically and no longer hypoxic with ambulation. Consults called: None Admission status: Place in observation.   Irine Seal MD [facility]  If 7PM-7AM, please contact night-coverage [email or web address]  07/06/2018, 5:22 PM

## 2018-07-06 NOTE — Progress Notes (Signed)
ED TO INPATIENT HANDOFF REPORT  Name/Age/Gender [name] 78 y.o. female  Code Status Code Status History    Date Active Date Inactive Code Status Order ID Comments User Context   02/23/2018 1408 02/26/2018 1548 Full Code [id number]  Mcarthur Rossetti, MD Inpatient   12/28/2017 2006 12/31/2017 1650 DNR [id number]  Elodia Florence., MD Inpatient   08/12/2016 1812 08/16/2016 1528 Full Code [id number]  Erline Levine, MD Inpatient   06/01/2012 1607 06/04/2012 1444 Full Code [id number]  Arletha Pili, RN Inpatient      Home/SNF/Other Home  Chief Complaint Chest Pain  Level of Care/Admitting Diagnosis ED Disposition    ED Disposition Condition [facility] Area: [facility] [[id number]]  Level of Care: Med-Surg [16]  Diagnosis: CAP (community acquired pneumonia) [825053]  Admitting Physician: Eugenie Filler [[id number]]  Attending Physician: Eugenie Filler [[id number]]  PT Class (Do Not Modify): Observation [104]  PT Acc Code (Do Not Modify): Observation [10022]       Medical History Past Medical History:  Diagnosis Date  . Anemia   . Arthritis   . Asthma    adult onset  . Basal cell cancer    LUE; Porokeratosis also  . Chronic kidney disease 1963   strep in kidney due to strep throat-hospitalized 10 days  . Complication of anesthesia    small trachea  . Diabetes mellitus 2010   A1c 6.7%  . DVT (deep venous thrombosis) ([place]) 2006   post immobilization post cns surgery  . GERD (gastroesophageal reflux disease)    very mild  . Granulomatous lung disease ([place]) 2002   incidental Xray finding  . Hyperlipidemia   . Hypertension 2004   Hypertensive response on Stress Test  . Paralysis ([place]) 2006   post cervical fusion with spinal sac tear  with hematoma   . PTE (pulmonary thromboembolism) ([place]) 2006    Allergies Allergies  Allergen Reactions  . Statins Other (See Comments)    Myalgias and muscle weakness    IV  Location/Drains/Wounds Patient Lines/Drains/Airways Status   Active Line/Drains/Airways    Name:   Placement date:   Placement time:   Site:   Days:   Peripheral IV 07/06/18 Left Forearm   07/06/18    1701    Forearm   less than 1   External Urinary Catheter   12/28/17    2000    -   190   Incision 06/01/12 Hip Left   06/01/12    1429     2226   Incision (Closed) 08/12/16 Abdomen Left;Lower   08/12/16    1131     693   Incision (Closed) 08/12/16 Flank Left;Lateral   08/12/16    1337     693   Incision (Closed) 08/12/16 Back Other (Comment)   08/12/16    1533     693   Incision (Closed) 02/23/18 Hip Right   02/23/18    1136     133          Labs/Imaging Results for orders placed or performed during the hospital encounter of 07/06/18 (from the past 48 hour(s))  Basic metabolic panel     Status: Abnormal   Collection Time: 07/06/18  1:28 PM  Result Value Ref Range   Sodium 138 135 - 145 mmol/L   Potassium 3.6 3.5 - 5.1 mmol/L   Chloride 105 98 - 111 mmol/L   CO2 25 22 - 32 mmol/L  Glucose, Bld 133 (H) 70 - 99 mg/dL   BUN 8 8 - 23 mg/dL   Creatinine, Ser 0.81 0.44 - 1.00 mg/dL   Calcium 9.0 8.9 - 10.3 mg/dL   GFR calc non Af Amer >60 >60 mL/min   GFR calc Af Amer >60 >60 mL/min   Anion gap 8 5 - 15    Comment: Performed at [facility], [place] [address], [place], [place] [address]  CBC     Status: Abnormal   Collection Time: 07/06/18  1:28 PM  Result Value Ref Range   WBC 9.4 4.0 - 10.5 K/uL   RBC 4.16 3.87 - 5.11 MIL/uL   Hemoglobin 11.8 (L) 12.0 - 15.0 g/dL   HCT 36.8 36.0 - 46.0 %   MCV 88.5 80.0 - 100.0 fL   MCH 28.4 26.0 - 34.0 pg   MCHC 32.1 30.0 - 36.0 g/dL   RDW 16.2 (H) 11.5 - 15.5 %   Platelets 257 150 - 400 K/uL   nRBC 0.0 0.0 - 0.2 %    Comment: Performed at [facility], [place] [address] [place], [place] [address]  D-dimer, quantitative (not at [facility])     Status: Abnormal   Collection Time: 07/06/18  1:28 PM  Result Value  Ref Range   D-Dimer, Quant 1.82 (H) 0.00 - 0.50 ug/mL-FEU    Comment: (NOTE) At the manufacturer cut-off of 0.50 ug/mL FEU, this assay has been documented to exclude PE with a sensitivity and negative predictive value of 97 to 99%.  At this time, this assay has not been approved by the FDA to exclude DVT/VTE. Results should be correlated with clinical presentation. Performed at [facility], [place] [address] [place],  [address]   I-stat troponin, ED     Status: None   Collection Time: 07/06/18  1:33 PM  Result Value Ref Range   Troponin i, poc 0.00 0.00 - 0.08 ng/mL   Comment 3            Comment: Due to the release kinetics of cTnI, a negative result within the first hours of the onset of symptoms does not rule out myocardial infarction with certainty. If myocardial infarction is still suspected, repeat the test at appropriate intervals.   I-Stat Troponin, ED (not at [facility])     Status: None   Collection Time: 07/06/18  4:54 PM  Result Value Ref Range   Troponin i, poc 0.00 0.00 - 0.08 ng/mL   Comment 3            Comment: Due to the release kinetics of cTnI, a negative result within the first hours of the onset of symptoms does not rule out myocardial infarction with certainty. If myocardial infarction is still suspected, repeat the test at appropriate intervals.    Dg Chest 2 View  Result Date: 07/06/2018 CLINICAL DATA:  Chest pain.  Cough. EXAM: CHEST - 2 VIEW COMPARISON:  Chest radiograph 10/17/2016 FINDINGS: Monitoring leads overlie the patient. Normal cardiac and mediastinal contours. Anterior cervical spinal fusion hardware. Biapical pleuroparenchymal thickening. New small left pleural effusion and underlying opacities. No pneumothorax. Thoracic spine degenerative changes. IMPRESSION: New small left pleural effusion with underlying opacities which may represent atelectasis or infection. Followup PA and lateral chest X-ray is recommended in 3-4 weeks  following trial of antibiotic therapy to ensure resolution and exclude underlying malignancy. Electronically Signed   By: Lovey Newcomer M.D.   On: 07/06/2018 13:40   Ct Angio  Chest Pe W And/or Wo Contrast  Result Date: 07/06/2018 CLINICAL DATA:  Left pleuritic chest pain, remote history of PE, recent right hip replacement surgery EXAM: CT ANGIOGRAPHY CHEST WITH CONTRAST TECHNIQUE: Multidetector CT imaging of the chest was performed using the standard protocol during bolus administration of intravenous contrast. Multiplanar CT image reconstructions and MIPs were obtained to evaluate the vascular anatomy. CONTRAST:  163mL ISOVUE-370 IOPAMIDOL (ISOVUE-370) INJECTION 76% COMPARISON:  11/13/2004 FINDINGS: Cardiovascular: Heart size normal. No pericardial effusion. Satisfactory opacification of pulmonary arteries noted, and there is no evidence of pulmonary emboli. Scattered coronary calcifications. Adequate contrast opacification of the thoracic aorta with no evidence of dissection, aneurysm, or stenosis. There is classic 3-vessel brachiocephalic arch anatomy without proximal stenosis. Patchy calcified plaque in the distal arch, and visualized proximal abdominal segment. Mediastinum/Nodes: No hilar or mediastinal adenopathy. Lungs/Pleura: Trace pleural effusions left greater than right. No pneumothorax. Airspace consolidation in the anterior basal segment left lower lobe. Patchy atelectasis or infiltrate in the inferior lingula and medial right middle lobe. Minimal dependent atelectasis posteriorly in both lower lobes. Upper Abdomen: No acute findings Musculoskeletal: Cervical fixation hardware partially visualized. No fracture or worrisome bone lesion. Review of the MIP images confirms the above findings. IMPRESSION: 1. Negative for acute PE or thoracic aortic dissection. 2. Left lower lobe pneumonia with small pleural effusions. 3. Atherosclerosis, including aortic and coronary artery disease. Please note that  although the presence of coronary artery calcium documents the presence of coronary artery disease, the severity of this disease and any potential stenosis cannot be assessed on this non-gated CT examination. Assessment for potential risk factor modification, dietary therapy or pharmacologic therapy may be warranted, if clinically indicated. Electronically Signed   By: Lucrezia Europe M.D.   On: 07/06/2018 16:07    Pending Labs Unresulted Labs (From admission, onward)    Start     Ordered   07/06/18 1641  Urinalysis, Routine w reflex microscopic  Once,   R     07/06/18 1641   07/06/18 1641  Culture, Urine  Once,   R     07/06/18 1641   07/06/18 1640  HIV antibody (Routine Screening)  Once,   R     07/06/18 1640   07/06/18 1640  Culture, sputum-assessment  Once,   R     07/06/18 1640   07/06/18 1640  Gram stain  Once,   R     07/06/18 1640   07/06/18 1640  Strep pneumoniae urinary antigen  Once,   R     07/06/18 1640   07/06/18 1640  Legionella Pneumophila Serogp 1 Ur Ag  Once,   R     07/06/18 1640   07/06/18 1619  Blood Culture (routine x 2)  BLOOD CULTURE X 2,   STAT     07/06/18 1619          Vitals/Pain Today's Vitals   07/06/18 1344 07/06/18 1345 07/06/18 1400 07/06/18 1710  BP:  129/67  (!) 169/74  Pulse:      Resp:  14 12 17   Temp:      TempSrc:      SpO2:  94% 99% 98%  Weight:      Height:      PainSc: 0-No pain       Isolation Precautions No active isolations  Medications Medications  sodium chloride (PF) 0.9 % injection (has no administration in time range)  iopamidol (ISOVUE-370) 76 % injection (has no administration in time range)  cefTRIAXone (ROCEPHIN)  2 g in sodium chloride 0.9 % 100 mL IVPB (2 g Intravenous New Bag/Given 07/06/18 1707)  azithromycin (ZITHROMAX) 500 mg in sodium chloride 0.9 % 250 mL IVPB (has no administration in time range)  cefTRIAXone (ROCEPHIN) 1 g in sodium chloride 0.9 % 100 mL IVPB (has no administration in time range)  azithromycin  (ZITHROMAX) 500 mg in sodium chloride 0.9 % 250 mL IVPB (has no administration in time range)  pantoprazole (PROTONIX) EC tablet 40 mg (has no administration in time range)  ibuprofen (ADVIL,MOTRIN) tablet 600 mg (has no administration in time range)  sodium chloride flush (NS) 0.9 % injection 3 mL (3 mLs Intravenous Given 07/06/18 1347)  iopamidol (ISOVUE-370) 76 % injection 100 mL (100 mLs Intravenous Contrast Given 07/06/18 1552)    Mobility walks with device

## 2018-07-07 DIAGNOSIS — J302 Other seasonal allergic rhinitis: Secondary | ICD-10-CM

## 2018-07-07 DIAGNOSIS — D649 Anemia, unspecified: Secondary | ICD-10-CM

## 2018-07-07 DIAGNOSIS — E1151 Type 2 diabetes mellitus with diabetic peripheral angiopathy without gangrene: Secondary | ICD-10-CM | POA: Diagnosis not present

## 2018-07-07 DIAGNOSIS — E782 Mixed hyperlipidemia: Secondary | ICD-10-CM | POA: Diagnosis not present

## 2018-07-07 DIAGNOSIS — J4520 Mild intermittent asthma, uncomplicated: Secondary | ICD-10-CM | POA: Diagnosis not present

## 2018-07-07 DIAGNOSIS — R030 Elevated blood-pressure reading, without diagnosis of hypertension: Secondary | ICD-10-CM

## 2018-07-07 DIAGNOSIS — J181 Lobar pneumonia, unspecified organism: Secondary | ICD-10-CM | POA: Diagnosis not present

## 2018-07-07 LAB — CBC
HCT: 31.2 % — ABNORMAL LOW (ref 36.0–46.0)
Hemoglobin: 9.6 g/dL — ABNORMAL LOW (ref 12.0–15.0)
MCH: 27.4 pg (ref 26.0–34.0)
MCHC: 30.8 g/dL (ref 30.0–36.0)
MCV: 88.9 fL (ref 80.0–100.0)
PLATELETS: 232 10*3/uL (ref 150–400)
RBC: 3.51 MIL/uL — AB (ref 3.87–5.11)
RDW: 16.3 % — ABNORMAL HIGH (ref 11.5–15.5)
WBC: 6.3 10*3/uL (ref 4.0–10.5)
nRBC: 0 % (ref 0.0–0.2)

## 2018-07-07 LAB — GLUCOSE, CAPILLARY
Glucose-Capillary: 101 mg/dL — ABNORMAL HIGH (ref 70–99)
Glucose-Capillary: 100 mg/dL — ABNORMAL HIGH (ref 70–99)

## 2018-07-07 LAB — VITAMIN B12: Vitamin B-12: 1492 pg/mL — ABNORMAL HIGH (ref 180–914)

## 2018-07-07 LAB — FERRITIN: Ferritin: 55 ng/mL (ref 11–307)

## 2018-07-07 LAB — IRON AND TIBC
IRON: 22 ug/dL — AB (ref 28–170)
Saturation Ratios: 10 % — ABNORMAL LOW (ref 10.4–31.8)
TIBC: 222 ug/dL — AB (ref 250–450)
UIBC: 200 ug/dL

## 2018-07-07 LAB — BASIC METABOLIC PANEL
ANION GAP: 7 (ref 5–15)
BUN: 6 mg/dL — ABNORMAL LOW (ref 8–23)
CALCIUM: 8.3 mg/dL — AB (ref 8.9–10.3)
CO2: 23 mmol/L (ref 22–32)
Chloride: 109 mmol/L (ref 98–111)
Creatinine, Ser: 0.69 mg/dL (ref 0.44–1.00)
GFR calc Af Amer: >60 mL/min (ref >60)
GFR calc non Af Amer: >60 mL/min (ref >60)
Glucose, Bld: 119 mg/dL — ABNORMAL HIGH (ref 70–99)
Potassium: 4 mmol/L (ref 3.5–5.1)
Sodium: 139 mmol/L (ref 135–145)

## 2018-07-07 LAB — HEMOGLOBIN AND HEMATOCRIT, BLOOD
HCT: 34.2 % — ABNORMAL LOW (ref 36.0–46.0)
Hemoglobin: 10.4 g/dL — ABNORMAL LOW (ref 12.0–15.0)

## 2018-07-07 LAB — HIV ANTIBODY (ROUTINE TESTING W REFLEX): HIV Screen 4th Generation wRfx: NONREACTIVE

## 2018-07-07 LAB — FOLATE: Folate: 36.9 ng/mL (ref >5.9)

## 2018-07-07 MED ORDER — CEFPODOXIME PROXETIL 200 MG PO TABS: 200.0000 mg | ORAL_TABLET | Freq: Two times a day (BID) | ORAL | 0 refills | Status: AC

## 2018-07-07 MED ORDER — PANTOPRAZOLE SODIUM 40 MG PO TBEC: 40.0000 mg | DELAYED_RELEASE_TABLET | Freq: Every day | ORAL | 0 refills | Status: DC

## 2018-07-07 MED ORDER — CELECOXIB 200 MG PO CAPS: 200.0000 mg | ORAL_CAPSULE | Freq: Every day | ORAL | Status: DC | PRN

## 2018-07-07 MED ORDER — LORATADINE 10 MG PO TABS: 10.0000 mg | ORAL_TABLET | Freq: Every day | ORAL | 0 refills | Status: DC

## 2018-07-07 MED ORDER — POLYETHYLENE GLYCOL 3350 17 G PO PACK: 17.0000 g | PACK | Freq: Every day | ORAL | Status: DC | PRN

## 2018-07-07 MED ORDER — ENSURE ENLIVE PO LIQD: 237.0000 mL | Freq: Two times a day (BID) | ORAL | 0 refills | Status: DC

## 2018-07-07 MED ORDER — ALBUTEROL SULFATE HFA 108 (90 BASE) MCG/ACT IN AERS: 2.0000 | INHALATION_SPRAY | Freq: Four times a day (QID) | RESPIRATORY_TRACT | 0 refills | Status: DC | PRN

## 2018-07-07 MED ORDER — IBUPROFEN 600 MG PO TABS: 600.0000 mg | ORAL_TABLET | Freq: Three times a day (TID) | ORAL | 0 refills | Status: AC

## 2018-07-07 MED ORDER — GUAIFENESIN ER 600 MG PO TB12: 1200.0000 mg | ORAL_TABLET | Freq: Two times a day (BID) | ORAL | 0 refills | Status: AC

## 2018-07-07 MED ORDER — AZITHROMYCIN 250 MG PO TABS
500.0000 mg | ORAL_TABLET | ORAL | Status: DC
  Administered 2018-07-07: 500 mg via ORAL
  Filled 2018-07-07: qty 2

## 2018-07-07 MED ORDER — LEVALBUTEROL HCL 0.63 MG/3ML IN NEBU: 0.6300 mg | INHALATION_SOLUTION | Freq: Four times a day (QID) | RESPIRATORY_TRACT | Status: DC | PRN

## 2018-07-07 MED ORDER — AZITHROMYCIN 250 MG PO TABS: 500.0000 mg | ORAL_TABLET | Freq: Every day | ORAL | 0 refills | Status: AC

## 2018-07-07 NOTE — Progress Notes (Signed)
PHARMACIST - PHYSICIAN COMMUNICATION DR:  Grandville Silos CONCERNING: Antibiotic IV to Oral Route Change Policy  RECOMMENDATION: This patient is receiving Zithromax by the intravenous route.  Based on criteria approved by the Pharmacy and Therapeutics Committee, the antibiotic(s) is/are being converted to the equivalent oral dose form(s).   DESCRIPTION: These criteria include:  Patient being treated for a respiratory tract infection, urinary tract infection, cellulitis or clostridium difficile associated diarrhea if on metronidazole  The patient is not neutropenic and does not exhibit a GI malabsorption state  The patient is eating (either orally or via tube) and/or has been taking other orally administered medications for a least 24 hours  The patient is improving clinically and has a Tmax < 100.5  If you have questions about this conversion, please contact the Pharmacy Department  []   [phone] )  [facility] []   [phone] )  [facility] []   [phone] )  [facility] []   [phone] )  [facility] [x]   [phone] )  [place], PharmD, BCPS 07/07/2018@9 :13 AM

## 2018-07-07 NOTE — Discharge Summary (Signed)
Physician Discharge Summary  IO DIEUJUSTE PJA:[id number] DOB: 04-Dec-1940 DOA: 07/06/2018  PCP: [name], DO  Admit date: 07/06/2018 Discharge date: 07/07/2018  Time spent: 50 minutes  Recommendations for Outpatient Follow-up:  1. Follow-up with [name] R, DO in 2 weeks.  On follow-up patient's pneumonia will need to be reassessed at that time.  Patient will need a basic metabolic profile done as well as a CBC done on follow-up.   Discharge Diagnoses:  Principal Problem:   CAP (community acquired pneumonia) Active Problems:   HYPERLIPIDEMIA   Asthma, mild intermittent   ELEVATED BLOOD PRESSURE WITHOUT DIAGNOSIS OF HYPERTENSION   Degenerative arthritis of hip   Anemia   Diabetes mellitus with peripheral vascular disease (HCC)   Seasonal allergies   Status post total replacement of right hip   Pleuritic chest pain   Pleural effusion   Discharge Condition: Stable and improved  Diet recommendation: Carb modified diet.  Filed Weights   07/06/18 1204  Weight: 66.7 kg    History of present illness:  [name] is a 78 y.o. female with medical history significant of hyperlipidemia, well-controlled type 2 diabetes mellitus, prior history of PE no longer on anticoagulation, arthritis, asthma who presented to the ED with a 1 to 2-day history of left-sided pleuritic chest pain.  Patient stated that 2 days prior to admission had back pain that subsequently moved to the anterior portion of the chest.  Patient stated had pain with movement.  Patient described the pain as an ache and an intense pain that was intermittent in nature and lasted a few seconds and then goes away.  Patient did endorse symptoms of shortness of breath, not feeling well, multiple hours of emesis, nausea, weakness 1 week prior to admission which has since improved.  Patient also did endorse a productive cough of sputum x1 episode 1 day prior to admission.  Patient endorsed lightheadedness and weakness as  well as chills.  Patient denied any abdominal pain, no diarrhea, no constipation, no dysuria, no melena, no hematemesis, no hematochezia, no asymmetric weakness or numbness, no significant visual changes, no wheezing.  Patient also with some complaints of shortness of breath.  ED Course: Patient seen in the ED, d-dimer noted to be elevated at 1.82.  Basic metabolic profile obtained unremarkable except a glucose of 133.  Point-of-care troponin was negative.  CBC unremarkable.  Chest x-ray done with new small left pleural effusion with underlying opacities which may represent atelectasis or infection.  CT angiogram chest which was done was negative for acute PE or thoracic aortic dissection.  Left lower lobe pneumonia with small pleural effusions were noted on CT chest.  Patient given a dose of IV Rocephin and azithromycin in the ED.  Blood cultures were ordered.  The ED PA patient was ambulated in the hallway and noted to desat to 84% on room air on ambulation.  Due to patient's hypoxia hospitalist were called to admit the patient for further evaluation and management.  Hospital Course:  1 community-acquired pneumonia left lower lobe with small left pleural effusion Patient presented with pleuritic chest pain and noted to have elevated d-dimer.  Chest x-ray which was done showed a small left pleural effusion and concerns for left basilar infiltrate versus atelectasis.  CT angiogram chest was done which was consistent with a left lower lobe pneumonia with small pleural effusion.  ED PA tried ambulating the patient however patient noted to be hypoxic with sats of 84% on room  air.  Due to patient's hypoxia and age hospitalist were asked to admit patient for further evaluation and management.  Blood cultures were obtained in the ED with no growth to date by day of discharge.  Sputum Gram stain and cultures were also ordered.  Urine Legionella antigen was pending at time of discharge.  Urine pneumococcus antigen  was negative.  Patient was placed empirically on IV Rocephin, IV azithromycin, scheduled nebs, Mucinex, Claritin, Flonase and PPI.  Patient improved clinically during the hospitalization.  Patient was seen by physical therapy and ambulated in the hallway with sats of 95 to 98% on room air with ambulation by day of discharge..  Patient's pleuritic chest pain also improved.  Patient be discharged in stable and improved condition on 5 more days of oral Vantin and azithromycin to complete a 7-day course of antibiotic treatment.  Outpatient follow-up with PCP.   2.  Pleuritic chest pain Likely secondary to problem #1.  D-dimer was elevated however CT angiogram chest was negative for PE, however consistent with left lower lobe pneumonia.  Patient stated had chest pain also with movement.  Patient was placed on scheduled ibuprofen as well as empiric antibiotics and incentive spirometry.  Patient's pleuritic chest pain improved during the hospitalization and patient will be discharged home on 5 more days of scheduled ibuprofen.  Outpatient follow-up with PCP.   3.  Diabetes mellitus type 2 Well-controlled.    Hemoglobin A1c noted to be 6.5 on 06/14/2018.  Patient's oral hypoglycemic agents were held and patient maintained on sliding scale insulin throughout the hospitalization.  4.  Seasonal allergies Patient maintained on Claritin.  5.  Status post total hip replacement right PT/OT.  6.  Hyperlipidemia Continued on home regimen of Zetia.  7.  Asthma Stable.  Placed on Dulera.  Scheduled Xopenex nebs.  Supportive care.  8.  Anemia Patient noted to have an anemia during the hospitalization likely dilutional effect.  Patient with no overt bleeding.  Anemia panel which was ordered was consistent with anemia of chronic disease.  Outpatient follow-up with PCP.    Procedures:  CT angiogram chest 07/06/2018  Chest x-ray 07/06/2018  Consultations:  None  Discharge Exam: Vitals:   07/07/18 0022  07/07/18 0957  BP:    Pulse: 81   Resp:    Temp:    SpO2: 96% 96%    General: NAD Cardiovascular: RRR Respiratory: CTAB  Discharge Instructions   Discharge Instructions    Diet Carb Modified   Complete by:  As directed    Increase activity slowly   Complete by:  As directed      Allergies as of 07/07/2018      Reactions   Statins Other (See Comments)   Myalgias and muscle weakness      Medication List    TAKE these medications   acetaminophen 650 MG CR tablet Commonly known as:  TYLENOL Take 650-1,300 mg by mouth 2 (two) times daily as needed for pain.   albuterol 108 (90 Base) MCG/ACT inhaler Commonly known as:  PROAIR HFA Inhale 2 puffs into the lungs every 6 (six) hours as needed for wheezing or shortness of breath. Use 2 puffs 3 times daily x 5 days then every 6 hours as needed. What changed:  additional instructions   alendronate 70 MG tablet Commonly known as:  FOSAMAX TAKE 1 TABLET(70 MG) BY MOUTH EVERY 7 DAYS WITH A FULL GLASS OF WATER AND ON AN EMPTY STOMACH What changed:  See the new instructions.  ASPERCREME EX Apply 1 application topically daily as needed (back pain).   ASPERCREME LIDOCAINE 4 % Ptch Generic drug:  Lidocaine Apply 1 patch topically as needed (back pain).   aspirin 81 MG chewable tablet Chew 1 tablet (81 mg total) by mouth 2 (two) times daily. What changed:  when to take this   azithromycin 250 MG tablet Commonly known as:  ZITHROMAX Take 2 tablets (500 mg total) by mouth daily for 5 days. Start taking on:  July 08, 2018   CALCIUM + D PO Take 1 tablet by mouth daily.   cefpodoxime 200 MG tablet Commonly known as:  VANTIN Take 1 tablet (200 mg total) by mouth 2 (two) times daily for 5 days. Start taking on:  July 08, 2018   celecoxib 200 MG capsule Commonly known as:  CELEBREX Take 1 capsule (200 mg total) by mouth daily as needed for mild pain (knee pain). What changed:  See the new instructions.    cholecalciferol 1000 units tablet Commonly known as:  VITAMIN D Take 1,000 Units by mouth daily.   cyclobenzaprine 10 MG tablet Commonly known as:  FLEXERIL TAKE 1 TABLET(10 MG) BY MOUTH THREE TIMES DAILY AS NEEDED FOR MUSCLE SPASMS What changed:  See the new instructions.   DYMISTA 137-50 MCG/ACT Susp Generic drug:  Azelastine-Fluticasone Place 2 sprays into the nose 2 (two) times daily as needed (congestion).   ezetimibe 10 MG tablet Commonly known as:  ZETIA Take 1 tablet (10 mg total) by mouth daily.   feeding supplement (ENSURE ENLIVE) Liqd Take 237 mLs by mouth 2 (two) times daily between meals.   ferrous sulfate 325 (65 FE) MG tablet Take 1 tablet (325 mg total) by mouth daily with breakfast.   Fluticasone-Salmeterol 250-50 MCG/DOSE Aepb Commonly known as:  ADVAIR DISKUS USE 1 INHALATION TWO TIMES  DAILY ; RINSE MOUTH What changed:    how much to take  how to take this  when to take this   guaiFENesin 600 MG 12 hr tablet Commonly known as:  MUCINEX Take 2 tablets (1,200 mg total) by mouth 2 (two) times daily for 5 days.   ibuprofen 600 MG tablet Commonly known as:  ADVIL,MOTRIN Take 1 tablet (600 mg total) by mouth 3 (three) times daily for 5 days.   loratadine 10 MG tablet Commonly known as:  CLARITIN Take 1 tablet (10 mg total) by mouth daily. Start taking on:  July 08, 2018   metFORMIN 500 MG 24 hr tablet Commonly known as:  GLUCOPHAGE-XR TAKE 1 TABLET(500 MG) BY MOUTH DAILY WITH BREAKFAST What changed:  See the new instructions.   multivitamin with minerals Tabs tablet Take 1 tablet by mouth daily.   mupirocin ointment 2 % Commonly known as:  BACTROBAN Apply 1 application topically daily as needed (hip immflamation).   MYRBETRIQ 50 MG Tb24 tablet Generic drug:  mirabegron ER TAKE 1 TABLET(50 MG) BY MOUTH DAILY What changed:  See the new instructions.   OCUVITE EXTRA PO Take 1 tablet by mouth daily.   pantoprazole 40 MG  tablet Commonly known as:  PROTONIX Take 1 tablet (40 mg total) by mouth daily at 6 (six) AM. Start taking on:  July 08, 2018   polyethylene glycol packet Commonly known as:  MIRALAX / GLYCOLAX Take 17 g by mouth daily as needed for mild constipation.   pregabalin 100 MG capsule Commonly known as:  LYRICA Take 1 capsule (100 mg total) by mouth 3 (three) times daily.   SYSTANE OP  Place 1 drop into both eyes daily as needed (dry eyes).   traMADol 50 MG tablet Commonly known as:  ULTRAM TAKE 1 TABLET BY MOUTH EVERY 6 HOURS AS NEEDED What changed:  reasons to take this      Allergies  Allergen Reactions  . Statins Other (See Comments)    Myalgias and muscle weakness   Follow-up Information    [name], DO. Schedule an appointment as soon as possible for a visit in 2 week(s).   Specialty:  Family Medicine Contact information: [address] [place] [address] [phone]            The results of significant diagnostics from this hospitalization (including imaging, microbiology, ancillary and laboratory) are listed below for reference.    Significant Diagnostic Studies: Dg Chest 2 View  Result Date: 07/06/2018 CLINICAL DATA:  Chest pain.  Cough. EXAM: CHEST - 2 VIEW COMPARISON:  Chest radiograph 10/17/2016 FINDINGS: Monitoring leads overlie the patient. Normal cardiac and mediastinal contours. Anterior cervical spinal fusion hardware. Biapical pleuroparenchymal thickening. New small left pleural effusion and underlying opacities. No pneumothorax. Thoracic spine degenerative changes. IMPRESSION: New small left pleural effusion with underlying opacities which may represent atelectasis or infection. Followup PA and lateral chest X-ray is recommended in 3-4 weeks following trial of antibiotic therapy to ensure resolution and exclude underlying malignancy. Electronically Signed   By: Lovey Newcomer M.D.   On: 07/06/2018 13:40   Ct Angio Chest Pe W  And/or Wo Contrast  Result Date: 07/06/2018 CLINICAL DATA:  Left pleuritic chest pain, remote history of PE, recent right hip replacement surgery EXAM: CT ANGIOGRAPHY CHEST WITH CONTRAST TECHNIQUE: Multidetector CT imaging of the chest was performed using the standard protocol during bolus administration of intravenous contrast. Multiplanar CT image reconstructions and MIPs were obtained to evaluate the vascular anatomy. CONTRAST:  177mL ISOVUE-370 IOPAMIDOL (ISOVUE-370) INJECTION 76% COMPARISON:  11/13/2004 FINDINGS: Cardiovascular: Heart size normal. No pericardial effusion. Satisfactory opacification of pulmonary arteries noted, and there is no evidence of pulmonary emboli. Scattered coronary calcifications. Adequate contrast opacification of the thoracic aorta with no evidence of dissection, aneurysm, or stenosis. There is classic 3-vessel brachiocephalic arch anatomy without proximal stenosis. Patchy calcified plaque in the distal arch, and visualized proximal abdominal segment. Mediastinum/Nodes: No hilar or mediastinal adenopathy. Lungs/Pleura: Trace pleural effusions left greater than right. No pneumothorax. Airspace consolidation in the anterior basal segment left lower lobe. Patchy atelectasis or infiltrate in the inferior lingula and medial right middle lobe. Minimal dependent atelectasis posteriorly in both lower lobes. Upper Abdomen: No acute findings Musculoskeletal: Cervical fixation hardware partially visualized. No fracture or worrisome bone lesion. Review of the MIP images confirms the above findings. IMPRESSION: 1. Negative for acute PE or thoracic aortic dissection. 2. Left lower lobe pneumonia with small pleural effusions. 3. Atherosclerosis, including aortic and coronary artery disease. Please note that although the presence of coronary artery calcium documents the presence of coronary artery disease, the severity of this disease and any potential stenosis cannot be assessed on this non-gated  CT examination. Assessment for potential risk factor modification, dietary therapy or pharmacologic therapy may be warranted, if clinically indicated. Electronically Signed   By: Lucrezia Europe M.D.   On: 07/06/2018 16:07    Microbiology: No results found for this or any previous visit (from the past 240 hour(s)).   Labs: Basic Metabolic Panel: Recent Labs  Lab 07/06/18 1328 07/06/18 1906 07/07/18 0555  NA 138  --  139  K 3.6  --  4.0  CL 105  --  109  CO2 25  --  23  GLUCOSE 133*  --  119*  BUN 8  --  6*  CREATININE 0.81  --  0.69  CALCIUM 9.0  --  8.3*  MG  --  2.1  --    Liver Function Tests: No results for input(s): AST, ALT, ALKPHOS, BILITOT, PROT, ALBUMIN in the last 168 hours. No results for input(s): LIPASE, AMYLASE in the last 168 hours. No results for input(s): AMMONIA in the last 168 hours. CBC: Recent Labs  Lab 07/06/18 1328 07/07/18 0555 07/07/18 1431  WBC 9.4 6.3  --   HGB 11.8* 9.6* 10.4*  HCT 36.8 31.2* 34.2*  MCV 88.5 88.9  --   PLT 257 232  --    Cardiac Enzymes: No results for input(s): CKTOTAL, CKMB, CKMBINDEX, TROPONINI in the last 168 hours. BNP: BNP (last 3 results) No results for input(s): BNP in the last 8760 hours.  ProBNP (last 3 results) No results for input(s): PROBNP in the last 8760 hours.  CBG: Recent Labs  Lab 07/06/18 1826 07/06/18 2229 07/07/18 0803 07/07/18 1216  GLUCAP 94 137* 101* 100*       Signed:  Irine Seal MD.  [facility] 07/07/2018, 2:48 PM

## 2018-07-07 NOTE — Progress Notes (Signed)
SATURATION QUALIFICATIONS: (This note is used to comply with regulatory documentation for home oxygen)  Patient Saturations on Room Air at Rest = 96%  Patient Saturations on Room Air while Ambulating = 95%  

## 2018-07-07 NOTE — Progress Notes (Signed)
Pt walked ~60ft around the room to obtain an SPO2. Sats remained 95-97% while walking and 96% on RA resting in the bed. Pt complained of some sharp chest pain with arm movement (ie reaching for the IV pole) but faded within 10 seconds.

## 2018-07-07 NOTE — Evaluation (Signed)
Occupational Therapy Evaluation Patient Details Name: [name] MRN: [id number] DOB: 1940-11-27 Today's Date: 07/07/2018    History of Present Illness 78 y.o. female with PMHx significant of prev R THA 01/2018, hx of lumbar fusion 2018, type 2 diabetes mellitus, prior history of PE no longer on anticoagulation, arthritis, asthma who presents to the ED with a 1 to 2-day history of left-sided pleuritic chest pain. Patient does endorse symptoms of shortness of breath, not feeling well, multiple hours of emesis, nausea, weakness 1 week prior to admission which has since improved. Chest x-ray done with new small left pleural effusion with underlying opacities which may represent atelectasis or infection.  CT angiogram chest which was done was negative for acute PE or thoracic aortic dissection. Pt admitted for further evaluation and management.   Clinical Impression   This 78 y/o female presents with the above. At baseline pt reports she is mod independent with ADL and functional mobility using SPC, was driving; reports she resides in ILF which provides some meals. Pt completing room level mobility this session at overall minguard assist level without AD. Demonstrates standing grooming and UB ADL with supervision; currently requires minA for LB ADL however pt reports she has necessary AE at home for completing LB ADL. Pt on RA with activity today and O2 sats maintaining >95%. Feel she is close to her baseline regarding ADL and mobility completion. Educated pt on activity progression after return home with pt verbalizing understanding. Questions answered throughout with no further acute OT needs identified at this time. Acute OT to sign off at this time. Thank you for this referral.     Follow Up Recommendations  No OT follow up;Supervision - Intermittent    Equipment Recommendations  None recommended by OT(pt's DME needs are met)           Precautions / Restrictions Precautions Precautions: Other  (comment) Precaution Comments: monitor O2 sats Restrictions Weight Bearing Restrictions: No      Mobility Bed Mobility Overal bed mobility: Needs Assistance Bed Mobility: Sit to Supine       Sit to supine: Min guard;HOB elevated   General bed mobility comments: HOB elevated, increased time/effort but no physical assist required  Transfers Overall transfer level: Needs assistance Equipment used: None Transfers: Sit to/from Stand Sit to Stand: Supervision         General transfer comment: for general safety and immediate standing balance    Balance Overall balance assessment: Mild deficits observed, not formally tested                                         ADL either performed or assessed with clinical judgement   ADL Overall ADL's : Needs assistance/impaired Eating/Feeding: Independent;Sitting   Grooming: Supervision/safety;Standing;Wash/dry hands   Upper Body Bathing: Modified independent;Sitting   Lower Body Bathing: Min guard;Sit to/from stand   Upper Body Dressing : Modified independent;Sitting   Lower Body Dressing: Min guard;Sit to/from stand;With adaptive equipment Lower Body Dressing Details (indicate cue type and reason): pt reports she uses AE at home for completion of LB ADL tasks as she currently cannot access feet  Toilet Transfer: Min guard;Ambulation Toilet Transfer Details (indicate cue type and reason): simulated via room level mobility, transfer to/from EOB Toileting- Clothing Manipulation and Hygiene: Min guard;Sit to/from stand       Functional mobility during ADLs: Min guard;Supervision/safety General ADL Comments: educated  pt in activity progresion and energy conservation initially after return home; pt on RA this session with O2 maintaining at 95% and above with mobility a short distance in room (approx 15-20')     Vision Baseline Vision/History: Wears glasses       Perception     Praxis      Pertinent  Vitals/Pain Pain Assessment: No/denies pain     Hand Dominance Right   Extremity/Trunk Assessment Upper Extremity Assessment Upper Extremity Assessment: Generalized weakness;LUE deficits/detail LUE Deficits / Details: pt reports baseline muscle atrophy in LUE compared to RUE from previous injury, overall able to use for functional tasks without difficulty    Lower Extremity Assessment Lower Extremity Assessment: Defer to PT evaluation       Communication Communication Communication: No difficulties   Cognition Arousal/Alertness: Awake/alert Behavior During Therapy: WFL for tasks assessed/performed Overall Cognitive Status: Within Functional Limits for tasks assessed                                     General Comments       Exercises     Shoulder Instructions      Home Living Family/patient expects to be discharged to:: (ILF) Living Arrangements: Alone Available Help at Discharge: Family;Friend(s);Available PRN/intermittently Type of Home: Independent living facility Home Access: Level entry     Home Layout: One level(reports stairs to get to dining room of facility )     Bathroom Shower/Tub: Walk-in shower   Bathroom Toilet: Handicapped height     Home Equipment: Shower seat;Cane - single point;Grab bars - tub/shower;Grab bars - toilet;Walker - 2 wheels          Prior Functioning/Environment Level of Independence: Independent with assistive device(s)        Comments: pt reports just recently progressed to using [facility] for mobility after THA in September 2019        OT Problem List: Decreased strength;Decreased activity tolerance;Cardiopulmonary status limiting activity;Impaired balance (sitting and/or standing)      OT Treatment/Interventions:      OT Goals(Current goals can be found in the care plan section) Acute Rehab OT Goals Patient Stated Goal: hopeful for home today OT Goal Formulation: All assessment and education complete, DC  therapy  OT Frequency:     Barriers to D/C:            Co-evaluation              AM-PAC OT "6 Clicks" Daily Activity     Outcome Measure Help from another person eating meals?: None Help from another person taking care of personal grooming?: None Help from another person toileting, which includes using toliet, bedpan, or urinal?: None Help from another person bathing (including washing, rinsing, drying)?: None Help from another person to put on and taking off regular upper body clothing?: None Help from another person to put on and taking off regular lower body clothing?: A Little 6 Click Score: 23   End of Session Nurse Communication: Mobility status  Activity Tolerance: Patient tolerated treatment well Patient left: in bed;with call bell/phone within reach  OT Visit Diagnosis: Muscle weakness (generalized) (M62.81)                Time: 2725-3664 OT Time Calculation (min): 18 min Charges:  OT General Charges $OT Visit: 1 Visit OT Evaluation $OT Eval Low Complexity: 1 Low  Marcy Siren, OT Supplemental Rehabilitation Services Pager  [phone] Office [phone]   Orlando Penner 07/07/2018, 10:16 AM

## 2018-07-07 NOTE — Evaluation (Signed)
Physical Therapy Evaluation Patient Details Name: [name] MRN: [id number] DOB: 12/22/1940 Today's Date: 07/07/2018   History of Present Illness  78 y.o. female with PMHx significant of prev R THA 01/2018, hx of lumbar fusion 2018, type 2 diabetes mellitus, prior history of PE no longer on anticoagulation, arthritis, asthma who presents to the ED with a 1 to 2-day history of left-sided pleuritic chest pain. Patient does endorse symptoms of shortness of breath, not feeling well, multiple hours of emesis, nausea, weakness 1 week prior to admission which has since improved. Chest x-ray done with new small left pleural effusion with underlying opacities which may represent atelectasis or infection.  CT angiogram chest which was done was negative for acute PE or thoracic aortic dissection. Pt admitted for further evaluation and management.  Clinical Impression  Patient evaluated by Physical Therapy with no further acute PT needs identified. All education has been completed and the patient has no further questions. Pt  Doing well, amb incr distance, denies dyspnea, SpO2= 95-98% on RA during mobility.  See below for any follow-up Physical Therapy or equipment needs. PT is signing off. Thank you for this referral.     Follow Up Recommendations No PT follow up    Equipment Recommendations  None recommended by PT    Recommendations for Other Services       Precautions / Restrictions Precautions Precautions: Other (comment) Precaution Comments: monitor O2 sats Restrictions Weight Bearing Restrictions: No      Mobility  Bed Mobility Overal bed mobility: Needs Assistance Bed Mobility: Supine to Sit;Sit to Supine     Supine to sit: Modified independent (Device/Increase time) Sit to supine: Modified independent (Device/Increase time)   General bed mobility comments: increased time but no physical assist required  Transfers   Equipment used: None Transfers: Sit to/from Stand Sit to Stand:  Supervision;Modified independent (Device/Increase time)         General transfer comment: for general safety and immediate standing balance  Ambulation/Gait Ambulation/Gait assistance: Supervision Gait Distance (Feet): 260 Feet Assistive device: Straight cane Gait Pattern/deviations: Step-through pattern;Decreased stride length     General Gait Details: supervision for safety, gait stability improved with incr distance  Stairs            Wheelchair Mobility    Modified Rankin (Stroke Patients Only)       Balance     Sitting balance-Leahy Scale: Good       Standing balance-Leahy Scale: Fair Standing balance comment: pt able to tolerate min perturbations without LOB, not tested beyond min             High level balance activites: Side stepping;Turns;Head turns;Sudden stops High Level Balance Comments: no LOB with above, wider BOS to compensate for baseline unsteady gait              Pertinent Vitals/Pain Pain Assessment: No/denies pain    Home Living Family/patient expects to be discharged to:: Other (Comment)([facility]) Living Arrangements: Alone Available Help at Discharge: Family;Friend(s);Available PRN/intermittently Type of Home: Independent living facility Home Access: Level entry     Home Layout: One level(elevator access) Home Equipment: Shower seat;Cane - single point;Grab bars - tub/shower;Grab bars - toilet;Walker - 2 wheels      Prior Function Level of Independence: Independent with assistive device(s)         Comments: pt reports just recently progressed to using [facility] for mobility after THA in September 2019     Hand Dominance  Extremity/Trunk Assessment   Upper Extremity Assessment Upper Extremity Assessment: Defer to OT evaluation;Generalized weakness LUE Deficits / Details: pt reports baseline muscle atrophy in LUE compared to RUE from previous injury, overall able to use for functional tasks without  difficulty  (per OT assessment)    Lower Extremity Assessment Lower Extremity Assessment: (at least 3 to 3+/5)       Communication   Communication: No difficulties  Cognition Arousal/Alertness: Awake/alert Behavior During Therapy: WFL for tasks assessed/performed Overall Cognitive Status: Within Functional Limits for tasks assessed                                        General Comments      Exercises     Assessment/Plan    PT Assessment Patent does not need any further PT services  PT Problem List         PT Treatment Interventions      PT Goals (Current goals can be found in the Care Plan section)  Acute Rehab PT Goals Patient Stated Goal: hopeful for home today PT Goal Formulation: All assessment and education complete, DC therapy    Frequency     Barriers to discharge        Co-evaluation               AM-PAC PT "6 Clicks" Mobility  Outcome Measure Help needed turning from your back to your side while in a flat bed without using bedrails?: None Help needed moving from lying on your back to sitting on the side of a flat bed without using bedrails?: None Help needed moving to and from a bed to a chair (including a wheelchair)?: None Help needed standing up from a chair using your arms (e.g., wheelchair or bedside chair)?: None Help needed to walk in hospital room?: None Help needed climbing 3-5 steps with a railing? : A Little 6 Click Score: 23    End of Session Equipment Utilized During Treatment: Gait belt Activity Tolerance: Patient tolerated treatment well Patient left: with call bell/phone within reach;in bed   PT Visit Diagnosis: Difficulty in walking, not elsewhere classified (R26.2)    Time: 0076-2263 PT Time Calculation (min) (ACUTE ONLY): 14 min   Charges:   PT Evaluation $PT Eval Low Complexity: 1 Low          Kenyon Ana, PT  Pager: [phone] Acute Rehab Dept [facility]):  [phone]   07/07/2018   [facility] 07/07/2018, 2:18 PM

## 2018-07-08 LAB — LEGIONELLA PNEUMOPHILA SEROGP 1 UR AG: L. pneumophila Serogp 1 Ur Ag: NEGATIVE

## 2018-07-08 LAB — URINE CULTURE: Culture: 40000 — AB

## 2018-07-09 ENCOUNTER — Telehealth: Payer: Self-pay | Admitting: *Deleted

## 2018-07-09 NOTE — Telephone Encounter (Signed)
Transition Care Management Follow-up Telephone Call   Date discharged? 07/07/2018   How have you been since you were released from the hospital? "doing better"   Do you understand why you were in the hospital? yes   Do you understand the discharge instructions? yes   Where were you discharged to? Home at [facility]   Items Reviewed:  Medications reviewed: yes  Allergies reviewed: yes  Dietary changes reviewed: yes  Referrals reviewed: yes   Functional Questionnaire:   Activities of Daily Living (ADLs):   She states they are independent in the following: ambulation, bathing and hygiene, feeding, continence, grooming, toileting and dressing States they require assistance with the following: na   Any transportation issues/concerns?: no   Any patient concerns? no   Confirmed importance and date/time of follow-up visits scheduled yes  Provider Appointment booked with PCP 07/19/2018 @1030  Confirmed with patient if condition begins to worsen call PCP or go to the ER.  Patient was given the office number and encouraged to call back with question or concerns.  : yes

## 2018-07-10 ENCOUNTER — Ambulatory Visit: Payer: Medicare Other | Admitting: Family Medicine

## 2018-07-11 LAB — CULTURE, BLOOD (ROUTINE X 2)
Culture: NO GROWTH
Culture: NO GROWTH
Special Requests: ADEQUATE

## 2018-07-16 ENCOUNTER — Telehealth: Payer: Self-pay | Admitting: Internal Medicine

## 2018-07-16 ENCOUNTER — Other Ambulatory Visit (INDEPENDENT_AMBULATORY_CARE_PROVIDER_SITE_OTHER): Payer: Self-pay | Admitting: Physician Assistant

## 2018-07-16 MED ORDER — FLUTICASONE-SALMETEROL 250-50 MCG/DOSE IN AEPB: 1.0000 | INHALATION_SPRAY | Freq: Two times a day (BID) | RESPIRATORY_TRACT | 5 refills | Status: DC

## 2018-07-16 NOTE — Telephone Encounter (Signed)
Call returned to patient, requesting refill of Advair. Confirmed pharmacy. Refill sent. Voiced understanding. Nothing further is needed at this time.

## 2018-07-19 ENCOUNTER — Ambulatory Visit (INDEPENDENT_AMBULATORY_CARE_PROVIDER_SITE_OTHER): Payer: Medicare Other | Admitting: Family Medicine

## 2018-07-19 ENCOUNTER — Encounter: Payer: Self-pay | Admitting: Family Medicine

## 2018-07-19 ENCOUNTER — Ambulatory Visit (HOSPITAL_BASED_OUTPATIENT_CLINIC_OR_DEPARTMENT_OTHER)
Admission: RE | Admit: 2018-07-19 | Discharge: 2018-07-19 | Disposition: A | Discharge status: Home / Self Care | Payer: Medicare Other | Source: Ambulatory Visit | Attending: Family Medicine | Admitting: Family Medicine

## 2018-07-19 VITALS — BP 120/70 | HR 72 | Temp 98.0°F | Resp 16 | Ht 64.0 in | Wt 153.8 lb

## 2018-07-19 DIAGNOSIS — J189 Pneumonia, unspecified organism: Secondary | ICD-10-CM | POA: Insufficient documentation

## 2018-07-19 DIAGNOSIS — J90 Pleural effusion, not elsewhere classified: Secondary | ICD-10-CM | POA: Diagnosis not present

## 2018-07-19 LAB — CBC WITH DIFFERENTIAL/PLATELET
Basophils Absolute: 0.1 10*3/uL (ref 0.0–0.1)
Basophils Relative: 0.8 % (ref 0.0–3.0)
Eosinophils Absolute: 0.2 10*3/uL (ref 0.0–0.7)
Eosinophils Relative: 3.2 % (ref 0.0–5.0)
HCT: 38.9 % (ref 36.0–46.0)
Hemoglobin: 12.6 g/dL (ref 12.0–15.0)
LYMPHS ABS: 2.5 10*3/uL (ref 0.7–4.0)
Lymphocytes Relative: 34.8 % (ref 12.0–46.0)
MCHC: 32.4 g/dL (ref 30.0–36.0)
MCV: 82.8 fl (ref 78.0–100.0)
Monocytes Absolute: 0.5 10*3/uL (ref 0.1–1.0)
Monocytes Relative: 7 % (ref 3.0–12.0)
NEUTROS PCT: 54.2 % (ref 43.0–77.0)
Neutro Abs: 3.9 10*3/uL (ref 1.4–7.7)
Platelets: 302 10*3/uL (ref 150.0–400.0)
RBC: 4.7 Mil/uL (ref 3.87–5.11)
RDW: 16.2 % — ABNORMAL HIGH (ref 11.5–15.5)
WBC: 7.2 10*3/uL (ref 4.0–10.5)

## 2018-07-19 LAB — BASIC METABOLIC PANEL WITH GFR
BUN: 15 mg/dL (ref 6–23)
CO2: 29 meq/L (ref 19–32)
Calcium: 10.7 mg/dL — ABNORMAL HIGH (ref 8.4–10.5)
Chloride: 106 meq/L (ref 96–112)
Creatinine, Ser: 0.93 mg/dL (ref 0.40–1.20)
GFR: 58.39 mL/min — ABNORMAL LOW (ref >60.00)
Glucose, Bld: 103 mg/dL — ABNORMAL HIGH (ref 70–99)
Potassium: 5.6 meq/L — ABNORMAL HIGH (ref 3.5–5.1)
Sodium: 142 meq/L (ref 135–145)

## 2018-07-19 NOTE — Assessment & Plan Note (Signed)
Pt finished abx Repeat labs and cxr today

## 2018-07-19 NOTE — Progress Notes (Signed)
Patient ID: [name], female    DOB: Jul 13, 1940  Age: 78 y.o. MRN: [id number]    Subjective:  Subjective  HPI [name] presents for f/u pneumonia.  Pt was admitted to [facility] for abx.  Pt was admitted 2/7 and d/c 2/8.  She is feeling much better  Ct done in hosp which found to have LLL pneumonia.   Review of Systems  Constitutional: Negative for appetite change, diaphoresis, fatigue and unexpected weight change.  Eyes: Negative for pain, redness and visual disturbance.  Respiratory: Negative for cough, chest tightness, shortness of breath and wheezing.   Cardiovascular: Negative for chest pain, palpitations and leg swelling.  Endocrine: Negative for cold intolerance, heat intolerance, polydipsia, polyphagia and polyuria.  Genitourinary: Negative for difficulty urinating, dysuria and frequency.  Neurological: Negative for dizziness, light-headedness, numbness and headaches.    History Past Medical History:  Diagnosis Date  . Anemia   . Arthritis   . Asthma    adult onset  . Basal cell cancer    LUE; Porokeratosis also  . Chronic kidney disease 1963   strep in kidney due to strep throat-hospitalized 10 days  . Complication of anesthesia    small trachea  . Diabetes mellitus 2010   A1c 6.7%  . DVT (deep venous thrombosis) ([place]) 2006   post immobilization post cns surgery  . GERD (gastroesophageal reflux disease)    very mild  . Granulomatous lung disease ([place]) 2002   incidental Xray finding  . Hyperlipidemia   . Hypertension 2004   Hypertensive response on Stress Test  . Paralysis ([place]) 2006   post cervical fusion with spinal sac tear  with hematoma   . PTE (pulmonary thromboembolism) ([place]) 2006    She has a past surgical history that includes Bunionectomy; Septoplasty; Tubal ligation; Colonoscopy (1992 & 2002); Rotator cuff repair (2009); epidural steroids (2006); Cervical fusion (2006); Tonsillectomy (78 years old); Total hip arthroplasty (06/01/2012); Anterior  lumbar fusion (N/A, 08/12/2016); Anterior lat lumbar fusion (N/A, 08/12/2016); Lumbar percutaneous pedicle screw 4 level (N/A, 08/12/2016); Abdominal exposure (N/A, 08/12/2016); and Total hip arthroplasty (Right, 02/23/2018).   Her family history includes COPD in her father; Cancer in her mother; Diabetes in her sister; Heart disease in her sister; Stroke in her maternal grandmother; Transient ischemic attack in her paternal aunt.She reports that she has never smoked. She has never used smokeless tobacco. She reports current alcohol use of about 1.0 standard drinks of alcohol per week. She reports that she does not use drugs.  Current Outpatient Medications on File Prior to Visit  Medication Sig Dispense Refill  . acetaminophen (TYLENOL) 650 MG CR tablet Take 650-1,300 mg by mouth 2 (two) times daily as needed for pain.    [name] albuterol (PROAIR HFA) 108 (90 Base) MCG/ACT inhaler Inhale 2 puffs into the lungs every 6 (six) hours as needed for wheezing or shortness of breath. Use 2 puffs 3 times daily x 5 days then every 6 hours as needed. 3 Inhaler 0  . alendronate (FOSAMAX) 70 MG tablet TAKE 1 TABLET(70 MG) BY MOUTH EVERY 7 DAYS WITH A FULL GLASS OF WATER AND ON AN EMPTY STOMACH (Patient taking differently: Take 70 mg by mouth once a week. ) 12 tablet 1  . aspirin 81 MG chewable tablet Chew 1 tablet (81 mg total) by mouth 2 (two) times daily. (Patient taking differently: Chew 81 mg by mouth daily. ) 30 tablet 0  . Azelastine-Fluticasone (DYMISTA) 137-50 MCG/ACT SUSP Place 2 sprays into  the nose 2 (two) times daily as needed (congestion).     . Calcium Citrate-Vitamin D (CALCIUM + D PO) Take 1 tablet by mouth daily.    . celecoxib (CELEBREX) 200 MG capsule TAKE 1 CAPSULE(200 MG) BY MOUTH DAILY 90 capsule 1  . cholecalciferol (VITAMIN D) 1000 UNITS tablet Take 1,000 Units by mouth daily.     . cyclobenzaprine (FLEXERIL) 10 MG tablet TAKE 1 TABLET(10 MG) BY MOUTH THREE TIMES DAILY AS NEEDED FOR MUSCLE SPASMS  (Patient taking differently: Take 10 mg by mouth 3 (three) times daily as needed for muscle spasms. ) 30 tablet 1  . ezetimibe (ZETIA) 10 MG tablet Take 1 tablet (10 mg total) by mouth daily. 90 tablet 1  . feeding supplement, ENSURE ENLIVE, (ENSURE ENLIVE) LIQD Take 237 mLs by mouth 2 (two) times daily between meals. 237 mL 0  . ferrous sulfate 325 (65 FE) MG tablet Take 1 tablet (325 mg total) by mouth daily with breakfast. 90 tablet 1  . Fluticasone-Salmeterol (ADVAIR DISKUS) 250-50 MCG/DOSE AEPB Inhale 1 puff into the lungs 2 (two) times daily. USE 1 INHALATION TWO TIMES  DAILY ; RINSE MOUTH 60 each 5  . Lidocaine (ASPERCREME LIDOCAINE) 4 % PTCH Apply 1 patch topically as needed (back pain).    . metFORMIN (GLUCOPHAGE-XR) 500 MG 24 hr tablet TAKE 1 TABLET(500 MG) BY MOUTH DAILY WITH BREAKFAST (Patient taking differently: Take 500 mg by mouth daily with breakfast. ) 90 tablet 1  . Multiple Vitamin (MULTIVITAMIN WITH MINERALS) TABS tablet Take 1 tablet by mouth daily.    . Multiple Vitamins-Minerals (OCUVITE EXTRA PO) Take 1 tablet by mouth daily.    . mupirocin ointment (BACTROBAN) 2 % Apply 1 application topically daily as needed (hip immflamation).     . MYRBETRIQ 50 MG TB24 tablet TAKE 1 TABLET(50 MG) BY MOUTH DAILY (Patient taking differently: Take 50 mg by mouth daily as needed (cellulitis). ) 30 tablet 5  . pantoprazole (PROTONIX) 40 MG tablet Take 1 tablet (40 mg total) by mouth daily at 6 (six) AM. 30 tablet 0  . Polyethyl Glycol-Propyl Glycol (SYSTANE OP) Place 1 drop into both eyes daily as needed (dry eyes).    . polyethylene glycol (MIRALAX / GLYCOLAX) packet Take 17 g by mouth daily as needed for mild constipation.     . pregabalin (LYRICA) 100 MG capsule Take 1 capsule (100 mg total) by mouth 3 (three) times daily. 270 capsule 1  . traMADol (ULTRAM) 50 MG tablet TAKE 1 TABLET BY MOUTH EVERY 6 HOURS AS NEEDED (Patient taking differently: Take 50 mg by mouth every 6 (six) hours as  needed for moderate pain. ) 60 tablet 1  . Trolamine Salicylate (ASPERCREME EX) Apply 1 application topically daily as needed (back pain).     No current facility-administered medications on file prior to visit.      Objective:  Objective  Physical Exam Vitals signs and nursing note reviewed.  Constitutional:      Appearance: She is well-developed.  HENT:     Head: Normocephalic and atraumatic.  Eyes:     Conjunctiva/sclera: Conjunctivae normal.  Neck:     Musculoskeletal: Normal range of motion and neck supple.     Thyroid: No thyromegaly.     Vascular: No carotid bruit or JVD.  Cardiovascular:     Rate and Rhythm: Normal rate and regular rhythm.     Heart sounds: Normal heart sounds. No murmur.  Pulmonary:     Effort: Pulmonary effort  is normal. No respiratory distress.     Breath sounds: Normal breath sounds. No wheezing or rales.  Chest:     Chest wall: No tenderness.  Neurological:     Mental Status: She is alert and oriented to person, place, and time.    BP 120/70 (BP Location: Right Arm, Cuff Size: Normal)   Pulse 72   Temp 98 F (36.7 C) (Oral)   Resp 16   Ht 5\' 4"  (1.626 m)   Wt 153 lb 12.8 oz (69.8 kg)   SpO2 98%   BMI 26.40 kg/m  Wt Readings from Last 3 Encounters:  07/19/18 153 lb 12.8 oz (69.8 kg)  07/06/18 147 lb (66.7 kg)  06/14/18 156 lb 6.4 oz (70.9 kg)     Lab Results  Component Value Date   WBC 6.3 07/07/2018   HGB 10.4 (L) 07/07/2018   HCT 34.2 (L) 07/07/2018   PLT 232 07/07/2018   GLUCOSE 119 (H) 07/07/2018   CHOL 190 06/14/2018   TRIG 184.0 (H) 06/14/2018   HDL 54.00 06/14/2018   LDLDIRECT 127.0 12/11/2017   LDLCALC 99 06/14/2018   ALT 15 06/14/2018   AST 16 06/14/2018   NA 139 07/07/2018   K 4.0 07/07/2018   CL 109 07/07/2018   CREATININE 0.69 07/07/2018   BUN 6 (L) 07/07/2018   CO2 23 07/07/2018   TSH 5.38 (H) 06/03/2016   INR 0.96 05/28/2012   HGBA1C 6.5 06/14/2018   MICROALBUR <0.7 12/11/2017    Dg Chest 2  View  Result Date: 07/06/2018 CLINICAL DATA:  Chest pain.  Cough. EXAM: CHEST - 2 VIEW COMPARISON:  Chest radiograph 10/17/2016 FINDINGS: Monitoring leads overlie the patient. Normal cardiac and mediastinal contours. Anterior cervical spinal fusion hardware. Biapical pleuroparenchymal thickening. New small left pleural effusion and underlying opacities. No pneumothorax. Thoracic spine degenerative changes. IMPRESSION: New small left pleural effusion with underlying opacities which may represent atelectasis or infection. Followup PA and lateral chest X-ray is recommended in 3-4 weeks following trial of antibiotic therapy to ensure resolution and exclude underlying malignancy. Electronically Signed   By: Lovey Newcomer M.D.   On: 07/06/2018 13:40   Ct Angio Chest Pe W And/or Wo Contrast  Result Date: 07/06/2018 CLINICAL DATA:  Left pleuritic chest pain, remote history of PE, recent right hip replacement surgery EXAM: CT ANGIOGRAPHY CHEST WITH CONTRAST TECHNIQUE: Multidetector CT imaging of the chest was performed using the standard protocol during bolus administration of intravenous contrast. Multiplanar CT image reconstructions and MIPs were obtained to evaluate the vascular anatomy. CONTRAST:  131mL ISOVUE-370 IOPAMIDOL (ISOVUE-370) INJECTION 76% COMPARISON:  11/13/2004 FINDINGS: Cardiovascular: Heart size normal. No pericardial effusion. Satisfactory opacification of pulmonary arteries noted, and there is no evidence of pulmonary emboli. Scattered coronary calcifications. Adequate contrast opacification of the thoracic aorta with no evidence of dissection, aneurysm, or stenosis. There is classic 3-vessel brachiocephalic arch anatomy without proximal stenosis. Patchy calcified plaque in the distal arch, and visualized proximal abdominal segment. Mediastinum/Nodes: No hilar or mediastinal adenopathy. Lungs/Pleura: Trace pleural effusions left greater than right. No pneumothorax. Airspace consolidation in the  anterior basal segment left lower lobe. Patchy atelectasis or infiltrate in the inferior lingula and medial right middle lobe. Minimal dependent atelectasis posteriorly in both lower lobes. Upper Abdomen: No acute findings Musculoskeletal: Cervical fixation hardware partially visualized. No fracture or worrisome bone lesion. Review of the MIP images confirms the above findings. IMPRESSION: 1. Negative for acute PE or thoracic aortic dissection. 2. Left lower lobe pneumonia with small  pleural effusions. 3. Atherosclerosis, including aortic and coronary artery disease. Please note that although the presence of coronary artery calcium documents the presence of coronary artery disease, the severity of this disease and any potential stenosis cannot be assessed on this non-gated CT examination. Assessment for potential risk factor modification, dietary therapy or pharmacologic therapy may be warranted, if clinically indicated. Electronically Signed   By: Lucrezia Europe M.D.   On: 07/06/2018 16:07     Assessment & Plan:  Plan  I have discontinued Ophie Burrowes. Manka's loratadine. I am also having her maintain her cholecalciferol, Multiple Vitamins-Minerals (OCUVITE EXTRA PO), multivitamin with minerals, Trolamine Salicylate (ASPERCREME EX), Polyethyl Glycol-Propyl Glycol (SYSTANE OP), pregabalin, alendronate, metFORMIN, MYRBETRIQ, Calcium Citrate-Vitamin D (CALCIUM + D PO), acetaminophen, aspirin, Azelastine-Fluticasone, polyethylene glycol, mupirocin ointment, ferrous sulfate, ezetimibe, traMADol, cyclobenzaprine, Lidocaine, pantoprazole, feeding supplement (ENSURE ENLIVE), albuterol, Fluticasone-Salmeterol, and celecoxib.  No orders of the defined types were placed in this encounter.   Problem List Items Addressed This Visit      Unprioritized   CAP (community acquired pneumonia) - Primary    Pt finished abx Repeat labs and cxr today       Relevant Orders   Basic metabolic panel   CBC with Differential/Platelet    DG Chest 2 View      Follow-up: Return if symptoms worsen or fail to improve.  [name], DO

## 2018-07-19 NOTE — Patient Instructions (Signed)
Community-Acquired Pneumonia, Adult  Pneumonia is an infection of the lungs. It causes swelling in the airways of the lungs. Mucus and fluid may also build up inside the airways.  One type of pneumonia can happen while a person is in a hospital. A different type can happen when a person is not in a hospital (community-acquired pneumonia).   What are the causes?    This condition is caused by germs (viruses, bacteria, or fungi). Some types of germs can be passed from one person to another. This can happen when you breathe in droplets from the cough or sneeze of an infected person.  What increases the risk?  You are more likely to develop this condition if you:   Have a long-term (chronic) disease, such as:  ? Chronic obstructive pulmonary disease (COPD).  ? Asthma.  ? Cystic fibrosis.  ? Congestive heart failure.  ? Diabetes.  ? Kidney disease.   Have HIV.   Have sickle cell disease.   Have had your spleen removed.   Do not take good care of your teeth and mouth (poor dental hygiene).   Have a medical condition that increases the risk of breathing in droplets from your own mouth and nose.   Have a weakened body defense system (immune system).   Are a smoker.   Travel to areas where the germs that cause this illness are common.   Are around certain animals or the places they live.  What are the signs or symptoms?   A dry cough.   A wet (productive) cough.   Fever.   Sweating.   Chest pain. This often happens when breathing deeply or coughing.   Fast breathing or trouble breathing.   Shortness of breath.   Shaking chills.   Feeling tired (fatigue).   Muscle aches.  How is this treated?  Treatment for this condition depends on many things. Most adults can be treated at home. In some cases, treatment must happen in a hospital. Treatment may include:   Medicines given by mouth or through an IV tube.   Being given extra oxygen.   Respiratory therapy.  In rare cases, treatment for very bad pneumonia  may include:   Using a machine to help you breathe.   Having a procedure to remove fluid from around your lungs.  Follow these instructions at home:  Medicines   Take over-the-counter and prescription medicines only as told by your doctor.  ? Only take cough medicine if you are losing sleep.   If you were prescribed an antibiotic medicine, take it as told by your doctor. Do not stop taking the antibiotic even if you start to feel better.  General instructions     Sleep with your head and neck raised (elevated). You can do this by sleeping in a recliner or by putting a few pillows under your head.   Rest as needed. Get at least 8 hours of sleep each night.   Drink enough water to keep your pee (urine) pale yellow.   Eat a healthy diet that includes plenty of vegetables, fruits, whole grains, low-fat dairy products, and lean protein.   Do not use any products that contain nicotine or tobacco. These include cigarettes, e-cigarettes, and chewing tobacco. If you need help quitting, ask your doctor.   Keep all follow-up visits as told by your doctor. This is important.  How is this prevented?  A shot (vaccine) can help prevent pneumonia. Shots are often suggested for:   People   older than 78 years of age.   People older than 78 years of age who:  ? Are having cancer treatment.  ? Have long-term (chronic) lung disease.  ? Have problems with their body's defense system.  You may also prevent pneumonia if you take these actions:   Get the flu (influenza) shot every year.   Go to the dentist as often as told.   Wash your hands often. If you cannot use soap and water, use hand sanitizer.  Contact a doctor if:   You have a fever.   You lose sleep because your cough medicine does not help.  Get help right away if:   You are short of breath and it gets worse.   You have more chest pain.   Your sickness gets worse. This is very serious if:  ? You are an older adult.  ? Your body's defense system is weak.   You  cough up blood.  Summary   Pneumonia is an infection of the lungs.   Most adults can be treated at home. Some will need treatment in a hospital.   Drink enough water to keep your pee pale yellow.   Get at least 8 hours of sleep each night.  This information is not intended to replace advice given to you by your health care provider. Make sure you discuss any questions you have with your health care provider.  Document Released: 11/02/2007 Document Revised: 01/11/2018 Document Reviewed: 01/11/2018  Elsevier Interactive Patient Education  2019 [employer]

## 2018-07-23 ENCOUNTER — Other Ambulatory Visit: Payer: Self-pay | Admitting: Family Medicine

## 2018-07-23 DIAGNOSIS — I10 Essential (primary) hypertension: Secondary | ICD-10-CM

## 2018-07-23 DIAGNOSIS — E1165 Type 2 diabetes mellitus with hyperglycemia: Secondary | ICD-10-CM

## 2018-07-23 DIAGNOSIS — E785 Hyperlipidemia, unspecified: Secondary | ICD-10-CM

## 2018-07-23 DIAGNOSIS — E1169 Type 2 diabetes mellitus with other specified complication: Secondary | ICD-10-CM

## 2018-07-30 ENCOUNTER — Encounter: Payer: Self-pay | Admitting: *Deleted

## 2018-08-02 ENCOUNTER — Telehealth: Payer: Self-pay | Admitting: Internal Medicine

## 2018-08-02 NOTE — Telephone Encounter (Signed)
ATC Patient.  Left message to call back. 

## 2018-08-03 MED ORDER — ALBUTEROL SULFATE HFA 108 (90 BASE) MCG/ACT IN AERS: 2.0000 | INHALATION_SPRAY | Freq: Four times a day (QID) | RESPIRATORY_TRACT | 3 refills | Status: DC | PRN

## 2018-08-03 NOTE — Telephone Encounter (Signed)
Pt returning call CB# [phone]//[phone]

## 2018-08-03 NOTE — Telephone Encounter (Signed)
Called and spoke with pt verifying preferred pharmacy and med that needed to be sent in. Refill of proair inhaler has been sent to pt's preferred pharmacy. Nothing further needed.

## 2018-08-10 ENCOUNTER — Ambulatory Visit: Payer: Medicare Other

## 2018-08-11 ENCOUNTER — Other Ambulatory Visit: Payer: Self-pay | Admitting: Family Medicine

## 2018-08-16 ENCOUNTER — Telehealth: Payer: Self-pay | Admitting: Internal Medicine

## 2018-08-16 MED ORDER — FLUTICASONE-SALMETEROL 250-50 MCG/DOSE IN AEPB: 1.0000 | INHALATION_SPRAY | Freq: Two times a day (BID) | RESPIRATORY_TRACT | 11 refills | Status: DC

## 2018-08-16 NOTE — Telephone Encounter (Signed)
Called and spoke with pt who stated she went to the ED 07/30/2018 due to having chest pains. Pt stated when pt went to the ER, she was told that she had pna.  Pt is wanting to know if she can be switched from generic Wixela back to Advair due to thinking that the [place] works better for her. Dr. Annamaria Boots, please advise on this for pt. Thanks!

## 2018-08-16 NOTE — Telephone Encounter (Signed)
Sorry she had pneumonia and I hope she is recovering quickly from that.   Ok to d/c [employer] and reorder Advair 250   # 1 (90 day ok)    Inhale 1 puff, then rinse mouth, twice daily, ref x 1 year

## 2018-08-16 NOTE — Telephone Encounter (Signed)
Called and spoke with patient refill sent nothing further needed.

## 2018-08-21 DIAGNOSIS — L570 Actinic keratosis: Secondary | ICD-10-CM | POA: Diagnosis not present

## 2018-08-21 DIAGNOSIS — L820 Inflamed seborrheic keratosis: Secondary | ICD-10-CM | POA: Diagnosis not present

## 2018-08-23 ENCOUNTER — Other Ambulatory Visit: Payer: Self-pay | Admitting: Family Medicine

## 2018-08-23 DIAGNOSIS — M545 Low back pain, unspecified: Secondary | ICD-10-CM

## 2018-10-03 ENCOUNTER — Other Ambulatory Visit: Payer: Self-pay

## 2018-10-03 ENCOUNTER — Ambulatory Visit (INDEPENDENT_AMBULATORY_CARE_PROVIDER_SITE_OTHER): Payer: Medicare Other | Admitting: Family Medicine

## 2018-10-03 ENCOUNTER — Encounter: Payer: Self-pay | Admitting: Family Medicine

## 2018-10-03 DIAGNOSIS — M79605 Pain in left leg: Secondary | ICD-10-CM | POA: Diagnosis not present

## 2018-10-03 MED ORDER — CEPHALEXIN 500 MG PO CAPS: 500.0000 mg | ORAL_CAPSULE | Freq: Four times a day (QID) | ORAL | 0 refills | Status: DC

## 2018-10-03 NOTE — Progress Notes (Signed)
Virtual Visit via Video Note  I connected with [name] on 10/05/18 at  1:20 PM EDT by a video enabled telemedicine application and verified that I am speaking with the correct person using two identifiers.  Location: Patient: home  Provider: office    I discussed the limitations of evaluation and management by telemedicine and the availability of in person appointments. The patient expressed understanding and agreed to proceed.  History of Present Illness: Pt states her L side is hurting just like when she had cellultis  It is Red and warm to touch    Observations/Objective: .  Unable to get vitals--afebrile L thigh / groin red and pt states warm to touch -- no pain with palp per pt Assessment and Plan: 1. Acute leg pain, left Pt states it does not hurt to touch abx per orders --- advised her to call back for ov If not better or go to ER  - cephALEXin (KEFLEX) 500 MG capsule; Take 1 capsule (500 mg total) by mouth 4 (four) times daily.  Dispense: 40 capsule; Refill: 0  Follow Up Instructions:    I discussed the assessment and treatment plan with the patient. The patient was provided an opportunity to ask questions and all were answered. The patient agreed with the plan and demonstrated an understanding of the instructions.   The patient was advised to call back or seek an in-person evaluation if the symptoms worsen or if the condition fails to improve as anticipated.  I provided 20 minutes of non-face-to-face time during this encounter.   [name], DO

## 2018-10-05 ENCOUNTER — Telehealth: Payer: Self-pay | Admitting: *Deleted

## 2018-10-05 NOTE — Telephone Encounter (Signed)
-----   Message from [name], DO sent at 10/05/2018 11:38 AM EDT ----- Please check on pt --- make sure she is doing better

## 2018-10-09 NOTE — Telephone Encounter (Signed)
Patient states that she is doing much better.

## 2018-10-12 ENCOUNTER — Other Ambulatory Visit: Payer: Self-pay | Admitting: Family Medicine

## 2018-10-12 DIAGNOSIS — M545 Low back pain, unspecified: Secondary | ICD-10-CM

## 2018-10-12 DIAGNOSIS — E1140 Type 2 diabetes mellitus with diabetic neuropathy, unspecified: Secondary | ICD-10-CM

## 2018-10-12 DIAGNOSIS — D509 Iron deficiency anemia, unspecified: Secondary | ICD-10-CM

## 2018-10-15 MED ORDER — CYCLOBENZAPRINE HCL 10 MG PO TABS: ORAL_TABLET | ORAL | 1 refills | Status: DC

## 2018-10-15 NOTE — Telephone Encounter (Signed)
Refill request for cylcobenzaprine and lyrica  lyrica  Last written: 01/15/18 Last ov: 10/03/18 Next ov: 12/13/18 Contract: will get at next visit UDS: will get at next visit

## 2018-10-24 ENCOUNTER — Telehealth: Payer: Self-pay | Admitting: Internal Medicine

## 2018-10-24 MED ORDER — ALBUTEROL SULFATE HFA 108 (90 BASE) MCG/ACT IN AERS: 2.0000 | INHALATION_SPRAY | Freq: Four times a day (QID) | RESPIRATORY_TRACT | 2 refills | Status: DC | PRN

## 2018-10-24 NOTE — Telephone Encounter (Signed)
Called and spoke with Patient. Patient requested Proair refill #90 to be sent to [facility].  Patient stated [facility] was on her formulary list from [place]. Refill sent per Patient request.  Nothing further at this time.

## 2018-11-05 ENCOUNTER — Other Ambulatory Visit: Payer: Self-pay | Admitting: Family Medicine

## 2018-11-05 DIAGNOSIS — E1169 Type 2 diabetes mellitus with other specified complication: Secondary | ICD-10-CM

## 2018-11-19 ENCOUNTER — Encounter: Payer: Self-pay | Admitting: Orthopaedic Surgery

## 2018-11-19 ENCOUNTER — Ambulatory Visit (INDEPENDENT_AMBULATORY_CARE_PROVIDER_SITE_OTHER): Payer: Medicare Other | Admitting: Orthopaedic Surgery

## 2018-11-19 ENCOUNTER — Other Ambulatory Visit: Payer: Self-pay

## 2018-11-19 ENCOUNTER — Ambulatory Visit: Payer: Self-pay

## 2018-11-19 DIAGNOSIS — Z96641 Presence of right artificial hip joint: Secondary | ICD-10-CM | POA: Diagnosis not present

## 2018-11-19 NOTE — Progress Notes (Signed)
The patient is a very pleasant 78 year old female who is now 9 months status post a right total hip arthroplasty.  Direct anterior approach.  We actually replaced her left hip remotely.  She is walking without any assistive device.  She says she feels wonderful and has no issues.  She stays at [facility].  She is slow getting up from her chair but then walks well after that.  She has no pain in the groin on range of motion of both hips.  Her leg lengths are equal.  She is very satisfied overall.  An AP pelvis shows well-seated implants on both sides with no lucency to suggest loosening and no complicating features.  At this point she will follow-up as needed.  All question concerns were answered and addressed.  If she has any issues at all she will let us know.

## 2018-11-21 DIAGNOSIS — H25013 Cortical age-related cataract, bilateral: Secondary | ICD-10-CM | POA: Diagnosis not present

## 2018-11-21 DIAGNOSIS — H40013 Open angle with borderline findings, low risk, bilateral: Secondary | ICD-10-CM | POA: Diagnosis not present

## 2018-11-21 DIAGNOSIS — H2513 Age-related nuclear cataract, bilateral: Secondary | ICD-10-CM | POA: Diagnosis not present

## 2018-11-21 DIAGNOSIS — H25042 Posterior subcapsular polar age-related cataract, left eye: Secondary | ICD-10-CM | POA: Diagnosis not present

## 2018-11-21 DIAGNOSIS — H2512 Age-related nuclear cataract, left eye: Secondary | ICD-10-CM | POA: Diagnosis not present

## 2018-12-11 DIAGNOSIS — Z01812 Encounter for preprocedural laboratory examination: Secondary | ICD-10-CM | POA: Diagnosis not present

## 2018-12-11 DIAGNOSIS — Z1159 Encounter for screening for other viral diseases: Secondary | ICD-10-CM | POA: Diagnosis not present

## 2018-12-11 DIAGNOSIS — H2513 Age-related nuclear cataract, bilateral: Secondary | ICD-10-CM | POA: Diagnosis not present

## 2018-12-13 ENCOUNTER — Encounter: Payer: Medicare Other | Admitting: Family Medicine

## 2018-12-17 DIAGNOSIS — H25812 Combined forms of age-related cataract, left eye: Secondary | ICD-10-CM | POA: Diagnosis not present

## 2018-12-17 DIAGNOSIS — E1169 Type 2 diabetes mellitus with other specified complication: Secondary | ICD-10-CM | POA: Diagnosis not present

## 2018-12-17 DIAGNOSIS — H25012 Cortical age-related cataract, left eye: Secondary | ICD-10-CM | POA: Diagnosis not present

## 2018-12-17 DIAGNOSIS — H2512 Age-related nuclear cataract, left eye: Secondary | ICD-10-CM | POA: Diagnosis not present

## 2018-12-17 HISTORY — PX: CATARACT EXTRACTION: SUR2

## 2018-12-24 DIAGNOSIS — L565 Disseminated superficial actinic porokeratosis (DSAP): Secondary | ICD-10-CM | POA: Diagnosis not present

## 2018-12-24 DIAGNOSIS — L0889 Other specified local infections of the skin and subcutaneous tissue: Secondary | ICD-10-CM | POA: Diagnosis not present

## 2018-12-24 DIAGNOSIS — L0100 Impetigo, unspecified: Secondary | ICD-10-CM | POA: Diagnosis not present

## 2018-12-25 DIAGNOSIS — H2511 Age-related nuclear cataract, right eye: Secondary | ICD-10-CM | POA: Diagnosis not present

## 2018-12-25 DIAGNOSIS — H2513 Age-related nuclear cataract, bilateral: Secondary | ICD-10-CM | POA: Diagnosis not present

## 2018-12-25 DIAGNOSIS — Z1159 Encounter for screening for other viral diseases: Secondary | ICD-10-CM | POA: Diagnosis not present

## 2018-12-25 DIAGNOSIS — Z01812 Encounter for preprocedural laboratory examination: Secondary | ICD-10-CM | POA: Diagnosis not present

## 2018-12-26 ENCOUNTER — Other Ambulatory Visit: Payer: Self-pay | Admitting: *Deleted

## 2018-12-26 DIAGNOSIS — M545 Low back pain, unspecified: Secondary | ICD-10-CM

## 2018-12-26 MED ORDER — CYCLOBENZAPRINE HCL 10 MG PO TABS: ORAL_TABLET | ORAL | 1 refills | Status: DC

## 2018-12-31 DIAGNOSIS — H5231 Anisometropia: Secondary | ICD-10-CM | POA: Diagnosis not present

## 2018-12-31 DIAGNOSIS — H2511 Age-related nuclear cataract, right eye: Secondary | ICD-10-CM | POA: Diagnosis not present

## 2018-12-31 DIAGNOSIS — E1169 Type 2 diabetes mellitus with other specified complication: Secondary | ICD-10-CM | POA: Diagnosis not present

## 2018-12-31 HISTORY — PX: CATARACT EXTRACTION: SUR2

## 2019-01-04 ENCOUNTER — Telehealth: Payer: Self-pay | Admitting: Internal Medicine

## 2019-01-04 ENCOUNTER — Other Ambulatory Visit: Payer: Self-pay | Admitting: Internal Medicine

## 2019-01-04 MED ORDER — ALBUTEROL SULFATE HFA 108 (90 BASE) MCG/ACT IN AERS: 2.0000 | INHALATION_SPRAY | Freq: Four times a day (QID) | RESPIRATORY_TRACT | 0 refills | Status: DC | PRN

## 2019-01-04 NOTE — Telephone Encounter (Signed)
Not sure why there was a problem- I see a phone message documenting that ProAir refill was sent 5/27. Certainly ok to send local Rx to her drug store please for albuterol hfa, # 1,  inhale 2 puffs every 6 hours if needed for wheezing or shortness of breath, refill x 12.

## 2019-01-04 NOTE — Telephone Encounter (Signed)
I saw that [name] was contacted 10/24/2018 in regards to having Rx for proair sent to [facility] for her.  Called and spoke with [name]. [name] said that she had contacted our office twice in regards to needing to have Rx for Proair sent to [facility] for her but she said that she never heard back from anyone at our office. Due to this, [name] is now willing to have her rescue inhaler changed to something different and instead of having the  Rx sent to [facility] for a 90-day supply, [name] is willing to have just 1 inhaler sent to her local pharmacy of [place] off [address].   Dr. Annamaria Boots, please advise on this for [name]. Thanks!  Allergies  Allergen Reactions  . Statins Other (See Comments)    Myalgias and muscle weakness     Current Outpatient Medications:  .  acetaminophen (TYLENOL) 650 MG CR tablet, Take 650-1,300 mg by mouth 2 (two) times daily as needed for pain., Disp: , Rfl:  .  albuterol (PROAIR HFA) 108 (90 Base) MCG/ACT inhaler, Inhale 2 puffs into the lungs every 6 (six) hours as needed for wheezing or shortness of breath. Use 2 puffs 3 times daily x 5 days then every 6 hours as needed., Disp: 3 Inhaler, Rfl: 2 .  alendronate (FOSAMAX) 70 MG tablet, Take 1 tablet (70 mg total) by mouth once a week., Disp: 12 tablet, Rfl: 3 .  aspirin 81 MG chewable tablet, Chew 1 tablet (81 mg total) by mouth 2 (two) times daily. (Patient taking differently: Chew 81 mg by mouth daily. ), Disp: 30 tablet, Rfl: 0 .  Azelastine-Fluticasone (DYMISTA) 137-50 MCG/ACT SUSP, Place 2 sprays into the nose 2 (two) times daily as needed (congestion). , Disp: , Rfl:  .  Calcium Carbonate (CALCIUM 600 PO), Take 0.5 tablets by mouth daily., Disp: , Rfl:  .  Calcium Citrate-Vitamin D (CALCIUM + D PO), Take 1 tablet by mouth daily., Disp: , Rfl:  .  celecoxib (CELEBREX) 200 MG capsule, TAKE 1 CAPSULE(200 MG) BY MOUTH DAILY, Disp: 90 capsule, Rfl: 1 .  cephALEXin (KEFLEX) 500 MG capsule, Take 1 capsule (500 mg total) by mouth 4 (four) times daily.,  Disp: 40 capsule, Rfl: 0 .  cholecalciferol (VITAMIN D) 1000 UNITS tablet, Take 1,000 Units by mouth daily. , Disp: , Rfl:  .  cyclobenzaprine (FLEXERIL) 10 MG tablet, TAKE 1 TABLET(10 MG) BY MOUTH THREE TIMES DAILY AS NEEDED FOR MUSCLE SPASMS, Disp: 30 tablet, Rfl: 1 .  ezetimibe (ZETIA) 10 MG tablet, TAKE 1 TABLET(10 MG) BY MOUTH DAILY, Disp: 90 tablet, Rfl: 1 .  feeding supplement, ENSURE ENLIVE, (ENSURE ENLIVE) LIQD, Take 237 mLs by mouth 2 (two) times daily between meals., Disp: 237 mL, Rfl: 0 .  FEROSUL 325 (65 Fe) MG tablet, TAKE 1 TABLET(325 MG) BY MOUTH DAILY WITH BREAKFAST, Disp: 90 tablet, Rfl: 1 .  Fluticasone-Salmeterol (ADVAIR DISKUS) 250-50 MCG/DOSE AEPB, Inhale 1 puff into the lungs 2 (two) times daily. USE 1 INHALATION TWO TIMES  DAILY ; RINSE MOUTH, Disp: 60 each, Rfl: 5 .  Fluticasone-Salmeterol (ADVAIR DISKUS) 250-50 MCG/DOSE AEPB, Inhale 1 puff into the lungs 2 (two) times daily., Disp: 180 each, Rfl: 11 .  Lidocaine (ASPERCREME LIDOCAINE) 4 % PTCH, Apply 1 patch topically as needed (back pain)., Disp: , Rfl:  .  metFORMIN (GLUCOPHAGE-XR) 500 MG 24 hr tablet, TAKE 1 TABLET(500 MG) BY MOUTH DAILY WITH BREAKFAST, Disp: 90 tablet, Rfl: 1 .  Multiple Vitamin (MULTIVITAMIN WITH MINERALS) TABS tablet, Take  1 tablet by mouth daily., Disp: , Rfl:  .  Multiple Vitamins-Minerals (OCUVITE EXTRA PO), Take 1 tablet by mouth daily., Disp: , Rfl:  .  mupirocin ointment (BACTROBAN) 2 %, Apply 1 application topically daily as needed (hip immflamation). , Disp: , Rfl:  .  MYRBETRIQ 50 MG TB24 tablet, TAKE 1 TABLET(50 MG) BY MOUTH DAILY (Patient taking differently: Take 50 mg by mouth daily as needed (cellulitis). ), Disp: 30 tablet, Rfl: 5 .  pantoprazole (PROTONIX) 40 MG tablet, Take 1 tablet (40 mg total) by mouth daily at 6 (six) AM., Disp: 30 tablet, Rfl: 0 .  Polyethyl Glycol-Propyl Glycol (SYSTANE OP), Place 1 drop into both eyes daily as needed (dry eyes)., Disp: , Rfl:  .  polyethylene  glycol (MIRALAX / GLYCOLAX) packet, Take 17 g by mouth daily as needed for mild constipation. , Disp: , Rfl:  .  pregabalin (LYRICA) 100 MG capsule, TAKE 1 CAPSULE(100 MG) BY MOUTH THREE TIMES DAILY, Disp: 270 capsule, Rfl: 1 .  traMADol (ULTRAM) 50 MG tablet, TAKE 1 TABLET BY MOUTH EVERY 6 HOURS AS NEEDED (Patient taking differently: Take 50 mg by mouth every 6 (six) hours as needed for moderate pain. ), Disp: 60 tablet, Rfl: 1 .  Trolamine Salicylate (ASPERCREME EX), Apply 1 application topically daily as needed (back pain)., Disp: , Rfl:

## 2019-01-04 NOTE — Telephone Encounter (Signed)
Spoke with pt. She is aware of CY's response. Pt only wanted #1 inhaler sent in with 0 refills. Rx has been sent in. Nothing further was needed.

## 2019-01-28 ENCOUNTER — Ambulatory Visit (INDEPENDENT_AMBULATORY_CARE_PROVIDER_SITE_OTHER): Payer: Medicare Other | Admitting: Family Medicine

## 2019-01-28 ENCOUNTER — Other Ambulatory Visit: Payer: Self-pay

## 2019-01-28 ENCOUNTER — Encounter: Payer: Self-pay | Admitting: Family Medicine

## 2019-01-28 VITALS — BP 122/53 | HR 69 | Temp 96.9°F | Resp 12 | Ht 64.0 in | Wt 162.2 lb

## 2019-01-28 DIAGNOSIS — N644 Mastodynia: Secondary | ICD-10-CM

## 2019-01-28 DIAGNOSIS — I10 Essential (primary) hypertension: Secondary | ICD-10-CM | POA: Diagnosis not present

## 2019-01-28 DIAGNOSIS — E1169 Type 2 diabetes mellitus with other specified complication: Secondary | ICD-10-CM | POA: Diagnosis not present

## 2019-01-28 DIAGNOSIS — E785 Hyperlipidemia, unspecified: Secondary | ICD-10-CM | POA: Diagnosis not present

## 2019-01-28 DIAGNOSIS — Z23 Encounter for immunization: Secondary | ICD-10-CM | POA: Diagnosis not present

## 2019-01-28 DIAGNOSIS — Z0000 Encounter for general adult medical examination without abnormal findings: Secondary | ICD-10-CM

## 2019-01-28 DIAGNOSIS — D509 Iron deficiency anemia, unspecified: Secondary | ICD-10-CM | POA: Diagnosis not present

## 2019-01-28 DIAGNOSIS — E1165 Type 2 diabetes mellitus with hyperglycemia: Secondary | ICD-10-CM | POA: Diagnosis not present

## 2019-01-28 DIAGNOSIS — J4520 Mild intermittent asthma, uncomplicated: Secondary | ICD-10-CM

## 2019-01-28 NOTE — Assessment & Plan Note (Signed)
Stable  con't inhalers 

## 2019-01-28 NOTE — Assessment & Plan Note (Signed)
Well controlled, no changes to meds. Encouraged heart healthy diet such as the DASH diet and exercise as tolerated.  °

## 2019-01-28 NOTE — Progress Notes (Signed)
Subjective:     [name] is a 78 y.o. female and is here for a comprehensive physical exam. The patient reports no new problems .  Social History   Socioeconomic History  . Marital status: Divorced    Spouse name: Not on file  . Number of children: Not on file  . Years of education: Not on file  . Highest education level: Not on file  Occupational History  . Not on file  Social Needs  . Financial resource strain: Not on file  . Food insecurity    Worry: Not on file    Inability: Not on file  . Transportation needs    Medical: Not on file    Non-medical: Not on file  Tobacco Use  . Smoking status: Never Smoker  . Smokeless tobacco: Never Used  Substance and Sexual Activity  . Alcohol use: Yes    Alcohol/week: 1.0 standard drinks    Types: 1 Glasses of wine per week    Comment: socially  . Drug use: No  . Sexual activity: Never  Lifestyle  . Physical activity    Days per week: Not on file    Minutes per session: Not on file  . Stress: Not on file  Relationships  . Social Herbalist on phone: Not on file    Gets together: Not on file    Attends religious service: Not on file    Active member of club or organization: Not on file    Attends meetings of clubs or organizations: Not on file    Relationship status: Not on file  . Intimate partner violence    Fear of current or ex partner: Not on file    Emotionally abused: Not on file    Physically abused: Not on file    Forced sexual activity: Not on file  Other Topics Concern  . Not on file  Social History Narrative  . Not on file   Health Maintenance  Topic Date Due  . OPHTHALMOLOGY EXAM  11/21/2017  . FOOT EXAM  06/08/2018  . URINE MICROALBUMIN  12/12/2018  . HEMOGLOBIN A1C  12/13/2018  . MAMMOGRAM  01/19/2019  . INFLUENZA VACCINE  12/29/2018  . TETANUS/TDAP  10/27/2020  . DEXA SCAN  Completed  . PNA vac Low Risk Adult  Completed    The following portions of the patient's history were  reviewed and updated as appropriate:  She  has a past medical history of Anemia, Arthritis, Asthma, Basal cell cancer, Chronic kidney disease ([id number]), Complication of anesthesia, Diabetes mellitus (2010), DVT (deep venous thrombosis) ([place]) (2006), GERD (gastroesophageal reflux disease), Granulomatous lung disease ([place]) (2002), Hyperlipidemia, Hypertension (2004), Paralysis ([place]) (2006), and PTE (pulmonary thromboembolism) ([place]) (2006). She does not have any pertinent problems on file. She  has a past surgical history that includes Bunionectomy; Septoplasty; Tubal ligation; Colonoscopy (1992 & 2002); Rotator cuff repair (2009); epidural steroids (2006); Cervical fusion (2006); Tonsillectomy (78 years old); Total hip arthroplasty (06/01/2012); Anterior lumbar fusion (N/A, 08/12/2016); Anterior lat lumbar fusion (N/A, 08/12/2016); Lumbar percutaneous pedicle screw 4 level (N/A, 08/12/2016); Abdominal exposure (N/A, 08/12/2016); Total hip arthroplasty (Right, 02/23/2018); Cataract extraction (Right, 12/31/2018); and Cataract extraction (Left, 12/17/2018). Her family history includes COPD in her father; Cancer in her mother; Diabetes in her sister; Heart disease in her sister; Stroke in her maternal grandmother; Transient ischemic attack in her paternal aunt. She  reports that she has never smoked. She has never used smokeless tobacco. She reports  current alcohol use of about 1.0 standard drinks of alcohol per week. She reports that she does not use drugs. She has a current medication list which includes the following prescription(s): acetaminophen, albuterol, alendronate, aspirin, azelastine-fluticasone, calcium carbonate, calcium citrate-vitamin d, celecoxib, cholecalciferol, cyclobenzaprine, ezetimibe, ferosul, fluticasone-salmeterol, fluticasone-salmeterol, lidocaine, metformin, multivitamin with minerals, multiple vitamins-minerals, mupirocin ointment, myrbetriq, polyethyl glycol-propyl glycol, polyethylene glycol,  pregabalin, tramadol, and trolamine salicylate. Current Outpatient Medications on File Prior to Visit  Medication Sig Dispense Refill  . acetaminophen (TYLENOL) 650 MG CR tablet Take 650-1,300 mg by mouth 2 (two) times daily as needed for pain.    [name] albuterol (VENTOLIN HFA) 108 (90 Base) MCG/ACT inhaler INHALE 2 PUFFS INTO THE LUNGS EVERY 6 HOURS AS NEEDED FOR WHEEZING OR SHORTNESS OF BREATH 25.5 g 0  . alendronate (FOSAMAX) 70 MG tablet Take 1 tablet (70 mg total) by mouth once a week. 12 tablet 3  . aspirin 81 MG chewable tablet Chew 1 tablet (81 mg total) by mouth 2 (two) times daily. (Patient taking differently: Chew 81 mg by mouth daily. ) 30 tablet 0  . Azelastine-Fluticasone (DYMISTA) 137-50 MCG/ACT SUSP Place 2 sprays into the nose 2 (two) times daily as needed (congestion).     . Calcium Carbonate (CALCIUM 600 PO) Take 0.5 tablets by mouth daily.    . Calcium Citrate-Vitamin D (CALCIUM + D PO) Take 1 tablet by mouth daily.    . celecoxib (CELEBREX) 200 MG capsule TAKE 1 CAPSULE(200 MG) BY MOUTH DAILY 90 capsule 1  . cholecalciferol (VITAMIN D) 1000 UNITS tablet Take 1,000 Units by mouth daily.     . cyclobenzaprine (FLEXERIL) 10 MG tablet TAKE 1 TABLET(10 MG) BY MOUTH THREE TIMES DAILY AS NEEDED FOR MUSCLE SPASMS 30 tablet 1  . ezetimibe (ZETIA) 10 MG tablet TAKE 1 TABLET(10 MG) BY MOUTH DAILY 90 tablet 1  . FEROSUL 325 (65 Fe) MG tablet TAKE 1 TABLET(325 MG) BY MOUTH DAILY WITH BREAKFAST 90 tablet 1  . Fluticasone-Salmeterol (ADVAIR DISKUS) 250-50 MCG/DOSE AEPB Inhale 1 puff into the lungs 2 (two) times daily. USE 1 INHALATION TWO TIMES  DAILY ; RINSE MOUTH 60 each 5  . Fluticasone-Salmeterol (ADVAIR DISKUS) 250-50 MCG/DOSE AEPB Inhale 1 puff into the lungs 2 (two) times daily. 180 each 11  . Lidocaine (ASPERCREME LIDOCAINE) 4 % PTCH Apply 1 patch topically as needed (back pain).    . metFORMIN (GLUCOPHAGE-XR) 500 MG 24 hr tablet TAKE 1 TABLET(500 MG) BY MOUTH DAILY WITH BREAKFAST 90  tablet 1  . Multiple Vitamin (MULTIVITAMIN WITH MINERALS) TABS tablet Take 1 tablet by mouth daily.    . Multiple Vitamins-Minerals (OCUVITE EXTRA PO) Take 1 tablet by mouth daily.    . mupirocin ointment (BACTROBAN) 2 % Apply 1 application topically daily as needed (hip immflamation).     . MYRBETRIQ 50 MG TB24 tablet TAKE 1 TABLET(50 MG) BY MOUTH DAILY (Patient taking differently: Take 50 mg by mouth daily as needed (cellulitis). ) 30 tablet 5  . Polyethyl Glycol-Propyl Glycol (SYSTANE OP) Place 1 drop into both eyes daily as needed (dry eyes).    . polyethylene glycol (MIRALAX / GLYCOLAX) packet Take 17 g by mouth daily as needed for mild constipation.     . pregabalin (LYRICA) 100 MG capsule TAKE 1 CAPSULE(100 MG) BY MOUTH THREE TIMES DAILY 270 capsule 1  . traMADol (ULTRAM) 50 MG tablet TAKE 1 TABLET BY MOUTH EVERY 6 HOURS AS NEEDED (Patient taking differently: Take 50 mg by mouth every 6 (six)  hours as needed for moderate pain. ) 60 tablet 1  . Trolamine Salicylate (ASPERCREME EX) Apply 1 application topically daily as needed (back pain).     No current facility-administered medications on file prior to visit.    She is allergic to statins..  Review of Systems Review of Systems  Constitutional: Negative for activity change, appetite change and fatigue.  HENT: Negative for hearing loss, congestion, tinnitus and ear discharge.  dentist q8m Eyes: Negative for visual disturbance (see optho q1y -- vision corrected to 20/20 with glasses).  Respiratory: Negative for cough, chest tightness and shortness of breath.   Cardiovascular: Negative for chest pain, palpitations and leg swelling.  Gastrointestinal: Negative for abdominal pain, diarrhea, constipation and abdominal distention.  Genitourinary: Negative for urgency, frequency, decreased urine volume and difficulty urinating.  Musculoskeletal: Negative for back pain, arthralgias and gait problem.  Skin: Negative for color change, pallor and  rash.  Neurological: Negative for dizziness, light-headedness, numbness and headaches.  Hematological: Negative for adenopathy. Does not bruise/bleed easily.  Psychiatric/Behavioral: Negative for suicidal ideas, confusion, sleep disturbance, self-injury, dysphoric mood, decreased concentration and agitation.       Objective:    BP (!) 122/53 (BP Location: Left Arm, Cuff Size: Normal)   Pulse 69   Temp (!) 96.9 F (36.1 C) (Oral)   Resp 12   Ht 5\' 4"  (1.626 m)   Wt 162 lb 3.2 oz (73.6 kg)   SpO2 97%   BMI 27.84 kg/m  General appearance: alert, cooperative, appears stated age and no distress Head: Normocephalic, without obvious abnormality, atraumatic Eyes: negative findings: lids and lashes normal and pupils equal, round, reactive to light and accomodation Ears: normal TM's and external ear canals both ears Nose: Nares normal. Septum midline. Mucosa normal. No drainage or sinus tenderness. Throat: lips, mucosa, and tongue normal; teeth and gums normal Neck: no adenopathy, no carotid bruit, no JVD, supple, symmetrical, trachea midline and thyroid not enlarged, symmetric, no tenderness/mass/nodules Back: symmetric, no curvature. ROM normal. No CVA tenderness. Lungs: clear to auscultation bilaterally Breasts: + tenderness L breast, no masses palp b/o , no nipple d/c Heart: regular rate and rhythm, S1, S2 normal, no murmur, click, rub or gallop Abdomen: soft, non-tender; bowel sounds normal; no masses,  no organomegaly Pelvic: not indicated; post-menopausal, no abnormal Pap smears in past Extremities: extremities normal, atraumatic, no cyanosis or edema Pulses: 2+ and symmetric Skin: Skin color, texture, turgor normal. No rashes or lesions Lymph nodes: Cervical, supraclavicular, and axillary nodes normal. Neurologic: Alert and oriented X 3, normal strength and tone. Normal symmetric reflexes. Normal coordination and gait    Assessment:    Healthy female exam.      Plan:     ghm utd Flu shot given See avs See After Visit Summary for Counseling Recommendations    1. Iron deficiency anemia, unspecified iron deficiency anemia type Check labs con't iron for now - CBC with Differential/Platelet - IBC + Ferritin  2. Uncontrolled type 2 diabetes mellitus with hyperglycemia ([place]) Check labs  hgba1c to be checked , minimize simple carbs. Increase exercise as tolerated. Continue current meds  - CBC with Differential/Platelet - Hemoglobin A1c  3. Essential hypertension Well controlled, no changes to meds. Encouraged heart healthy diet such as the DASH diet and exercise as tolerated.   4. Hyperlipidemia associated with type 2 diabetes mellitus ([place]) Encouraged heart healthy diet, increase exercise, avoid trans fats, consider a krill oil cap daily - Lipid panel - Comprehensive metabolic panel  5. Breast  pain, left   - MM Digital Diagnostic Bilat; Future - US BREAST COMPLETE UNI LEFT INC AXILLA; Future  6. Mild intermittent asthma without complication Stable con't inhalers

## 2019-01-28 NOTE — Addendum Note (Signed)
Addended by: Kem Boroughs D on: 01/28/2019 02:54 PM   Modules accepted: Orders

## 2019-01-28 NOTE — Assessment & Plan Note (Signed)
Encouraged heart healthy diet, increase exercise, avoid trans fats, consider a krill oil cap daily 

## 2019-01-28 NOTE — Patient Instructions (Signed)
Preventive Care 78 Years and Older, Female Preventive care refers to lifestyle choices and visits with your health care provider that can promote health and wellness. This includes:  A yearly physical exam. This is also called an annual well check.  Regular dental and eye exams.  Immunizations.  Screening for certain conditions.  Healthy lifestyle choices, such as diet and exercise. What can I expect for my preventive care visit? Physical exam Your health care provider will check:  Height and weight. These may be used to calculate body mass index (BMI), which is a measurement that tells if you are at a healthy weight.  Heart rate and blood pressure.  Your skin for abnormal spots. Counseling Your health care provider may ask you questions about:  Alcohol, tobacco, and drug use.  Emotional well-being.  Home and relationship well-being.  Sexual activity.  Eating habits.  History of falls.  Memory and ability to understand (cognition).  Work and work Statistician.  Pregnancy and menstrual history. What immunizations do I need?  Influenza (flu) vaccine  This is recommended every year. Tetanus, diphtheria, and pertussis (Tdap) vaccine  You may need a Td booster every 10 years. Varicella (chickenpox) vaccine  You may need this vaccine if you have not already been vaccinated. Zoster (shingles) vaccine  You may need this after age 78. Pneumococcal conjugate (PCV13) vaccine  One dose is recommended after age 78. Pneumococcal polysaccharide (PPSV23) vaccine  One dose is recommended after age 78 Measles, mumps, and rubella (MMR) vaccine  You may need at least one dose of MMR if you were born in 1957 or later. You may also need a second dose. Meningococcal conjugate (MenACWY) vaccine  You may need this if you have certain conditions. Hepatitis A vaccine  You may need this if you have certain conditions or if you travel or work in places where you may be exposed  to hepatitis A. Hepatitis B vaccine  You may need this if you have certain conditions or if you travel or work in places where you may be exposed to hepatitis B. Haemophilus influenzae type b (Hib) vaccine  You may need this if you have certain conditions. You may receive vaccines as individual doses or as more than one vaccine together in one shot (combination vaccines). Talk with your health care provider about the risks and benefits of combination vaccines. What tests do I need? Blood tests  Lipid and cholesterol levels. These may be checked every 5 years, or more frequently depending on your overall health.  Hepatitis C test.  Hepatitis B test. Screening  Lung cancer screening. You may have this screening every year starting at age 78 if you have a 30-pack-year history of smoking and currently smoke or have quit within the past 15 years.  Colorectal cancer screening. All adults should have this screening starting at age 78 and continuing until age 15. Your health care provider may recommend screening at age 78 if you are at increased risk. You will have tests every 1-10 years, depending on your results and the type of screening test.  Diabetes screening. This is done by checking your blood sugar (glucose) after you have not eaten for a while (fasting). You may have this done every 1-3 years.  Mammogram. This may be done every 1-2 years. Talk with your health care provider about how often you should have regular mammograms.  BRCA-related cancer screening. This may be done if you have a family history of breast, ovarian, tubal, or peritoneal cancers.  Other tests  Sexually transmitted disease (STD) testing.  Bone density scan. This is done to screen for osteoporosis. You may have this done starting at age 78. Follow these instructions at home: Eating and drinking  Eat a diet that includes fresh fruits and vegetables, whole grains, lean protein, and low-fat dairy products. Limit  your intake of foods with high amounts of sugar, saturated fats, and salt.  Take vitamin and mineral supplements as recommended by your health care provider.  Do not drink alcohol if your health care provider tells you not to drink.  If you drink alcohol: ? Limit how much you have to 0-1 drink a day. ? Be aware of how much alcohol is in your drink. In the [place] one drink equals one 12 oz bottle of beer (355 mL), one 5 oz glass of wine (148 mL), or one 1 oz glass of hard liquor (44 mL). Lifestyle  Take daily care of your teeth and gums.  Stay active. Exercise for at least 30 minutes on 5 or more days each week.  Do not use any products that contain nicotine or tobacco, such as cigarettes, e-cigarettes, and chewing tobacco. If you need help quitting, ask your health care provider.  If you are sexually active, practice safe sex. Use a condom or other form of protection in order to prevent STIs (sexually transmitted infections).  Talk with your health care provider about taking a low-dose aspirin or statin. What's next?  Go to your health care provider once a year for a well check visit.  Ask your health care provider how often you should have your eyes and teeth checked.  Stay up to date on all vaccines. This information is not intended to replace advice given to you by your health care provider. Make sure you discuss any questions you have with your health care provider. Document Released: 06/12/2015 Document Revised: 05/10/2018 Document Reviewed: 05/10/2018 Elsevier Patient Education  2020 [employer].

## 2019-01-29 LAB — COMPREHENSIVE METABOLIC PANEL
ALT: 18 U/L (ref 0–35)
AST: 19 U/L (ref 0–37)
Albumin: 3.9 g/dL (ref 3.5–5.2)
Alkaline Phosphatase: 80 U/L (ref 39–117)
BUN: 16 mg/dL (ref 6–23)
CO2: 27 mEq/L (ref 19–32)
Calcium: 9.6 mg/dL (ref 8.4–10.5)
Chloride: 103 mEq/L (ref 96–112)
Creatinine, Ser: 0.96 mg/dL (ref 0.40–1.20)
GFR: 56.21 mL/min — ABNORMAL LOW (ref >60.00)
Glucose, Bld: 119 mg/dL — ABNORMAL HIGH (ref 70–99)
Potassium: 4.3 mEq/L (ref 3.5–5.1)
Sodium: 140 mEq/L (ref 135–145)
Total Bilirubin: 0.4 mg/dL (ref 0.2–1.2)
Total Protein: 6.9 g/dL (ref 6.0–8.3)

## 2019-01-29 LAB — CBC WITH DIFFERENTIAL/PLATELET
Basophils Absolute: 0.1 10*3/uL (ref 0.0–0.1)
Basophils Relative: 1.1 % (ref 0.0–3.0)
Eosinophils Absolute: 0.1 10*3/uL (ref 0.0–0.7)
Eosinophils Relative: 1 % (ref 0.0–5.0)
HCT: 37.5 % (ref 36.0–46.0)
Hemoglobin: 12.4 g/dL (ref 12.0–15.0)
Lymphocytes Relative: 27.7 % (ref 12.0–46.0)
Lymphs Abs: 2.4 10*3/uL (ref 0.7–4.0)
MCHC: 32.9 g/dL (ref 30.0–36.0)
MCV: 87.1 fl (ref 78.0–100.0)
Monocytes Absolute: 0.6 10*3/uL (ref 0.1–1.0)
Monocytes Relative: 7.2 % (ref 3.0–12.0)
Neutro Abs: 5.4 10*3/uL (ref 1.4–7.7)
Neutrophils Relative %: 63 % (ref 43.0–77.0)
Platelets: 218 10*3/uL (ref 150.0–400.0)
RBC: 4.31 Mil/uL (ref 3.87–5.11)
RDW: 15.6 % — ABNORMAL HIGH (ref 11.5–15.5)
WBC: 8.6 10*3/uL (ref 4.0–10.5)

## 2019-01-29 LAB — IBC + FERRITIN
Ferritin: 63.4 ng/mL (ref 10.0–291.0)
Iron: 82 ug/dL (ref 42–145)
Saturation Ratios: 26.7 % (ref 20.0–50.0)
Transferrin: 219 mg/dL (ref 212.0–360.0)

## 2019-01-29 LAB — LIPID PANEL
Cholesterol: 189 mg/dL (ref 0–200)
HDL: 46.8 mg/dL (ref >39.00)
NonHDL: 141.81
Total CHOL/HDL Ratio: 4
Triglycerides: 305 mg/dL — ABNORMAL HIGH (ref 0.0–149.0)
VLDL: 61 mg/dL — ABNORMAL HIGH (ref 0.0–40.0)

## 2019-01-29 LAB — HEMOGLOBIN A1C: Hgb A1c MFr Bld: 7 % — ABNORMAL HIGH (ref 4.6–6.5)

## 2019-01-29 LAB — LDL CHOLESTEROL, DIRECT: Direct LDL: 95 mg/dL

## 2019-02-01 ENCOUNTER — Other Ambulatory Visit: Payer: Self-pay

## 2019-02-01 ENCOUNTER — Other Ambulatory Visit: Payer: Self-pay | Admitting: Family Medicine

## 2019-02-01 DIAGNOSIS — E785 Hyperlipidemia, unspecified: Secondary | ICD-10-CM

## 2019-02-01 DIAGNOSIS — E1165 Type 2 diabetes mellitus with hyperglycemia: Secondary | ICD-10-CM

## 2019-02-01 MED ORDER — METFORMIN HCL ER 500 MG PO TB24: ORAL_TABLET | ORAL | 2 refills | Status: DC

## 2019-02-14 ENCOUNTER — Ambulatory Visit
Admission: RE | Admit: 2019-02-14 | Discharge: 2019-02-14 | Disposition: A | Discharge status: Home / Self Care | Payer: Medicare Other | Source: Ambulatory Visit | Attending: Family Medicine | Admitting: Family Medicine

## 2019-02-14 ENCOUNTER — Other Ambulatory Visit: Payer: Self-pay

## 2019-02-14 ENCOUNTER — Ambulatory Visit: Payer: Medicare Other

## 2019-02-14 DIAGNOSIS — R928 Other abnormal and inconclusive findings on diagnostic imaging of breast: Secondary | ICD-10-CM | POA: Diagnosis not present

## 2019-02-14 DIAGNOSIS — N644 Mastodynia: Secondary | ICD-10-CM

## 2019-02-28 ENCOUNTER — Telehealth: Payer: Self-pay | Admitting: *Deleted

## 2019-02-28 ENCOUNTER — Other Ambulatory Visit: Payer: Self-pay | Admitting: Family Medicine

## 2019-02-28 DIAGNOSIS — M545 Low back pain, unspecified: Secondary | ICD-10-CM

## 2019-02-28 MED ORDER — CYCLOBENZAPRINE HCL 10 MG PO TABS: ORAL_TABLET | ORAL | 1 refills | Status: DC

## 2019-02-28 NOTE — Telephone Encounter (Signed)
sent 

## 2019-02-28 NOTE — Telephone Encounter (Signed)
Received fax from [facility] for cyclobenzaprine 10mg  three times daily as needed for muscle spasms. Please advise?

## 2019-04-09 DIAGNOSIS — L821 Other seborrheic keratosis: Secondary | ICD-10-CM | POA: Diagnosis not present

## 2019-04-09 DIAGNOSIS — D1801 Hemangioma of skin and subcutaneous tissue: Secondary | ICD-10-CM | POA: Diagnosis not present

## 2019-04-09 DIAGNOSIS — L570 Actinic keratosis: Secondary | ICD-10-CM | POA: Diagnosis not present

## 2019-04-09 DIAGNOSIS — L565 Disseminated superficial actinic porokeratosis (DSAP): Secondary | ICD-10-CM | POA: Diagnosis not present

## 2019-04-18 NOTE — Progress Notes (Signed)
Virtual Visit via Video Note  I connected with patient on 04/19/19 at 11:00 AM EST by audio enabled telemedicine application and verified that I am speaking with the correct person using two identifiers.   THIS ENCOUNTER IS A VIRTUAL VISIT DUE TO COVID-19 - PATIENT WAS NOT SEEN IN THE OFFICE. PATIENT HAS CONSENTED TO VIRTUAL VISIT / TELEMEDICINE VISIT   Location of patient: home  Location of provider: office  I discussed the limitations of evaluation and management by telemedicine and the availability of in person appointments. The patient expressed understanding and agreed to proceed.   Subjective:   [name] is a 78 y.o. female who presents for Medicare Annual (Subsequent) preventive examination.  Review of Systems: Home Safety/Smoke Alarms: Feels safe in home. Smoke alarms in place.  Lives on 1st floor at [facility].  Female:       Mammo-  02/14/19     Dexa scan-  01/18/18      CCS-Cologuard 01/25/17. Negative     Objective:     Vitals: Unable to assess. This visit is enabled though telemedicine due to Covid 19.   Advanced Directives 04/19/2019 07/06/2018 07/06/2018 03/06/2018 02/27/2018 02/23/2018 02/23/2018  Does Patient Have a Medical Advance Directive? Yes Yes No Yes Yes Yes Yes  Type of Paramedic of [place];Living will [place];Living will - [place];Living will [place];Living will [place];Living will [place];Living will  Does patient want to make changes to medical advance directive? No - Patient declined No - Patient declined - No - Patient declined No - Patient declined No - Patient declined No - Patient declined  Copy of [place] in Chart? Yes - validated most recent copy scanned in chart (See row information) No - copy requested - Yes Yes No - copy requested No - copy requested  Would patient like information on  creating a medical advance directive? - No - Patient declined No - Patient declined - - - -  Pre-existing out of facility DNR order (yellow form or pink MOST form) - - - - - - -    Tobacco Social History   Tobacco Use  Smoking Status Never Smoker  Smokeless Tobacco Never Used     Counseling given: Not Answered   Clinical Intake Pain : No/denies pain     Past Medical History:  Diagnosis Date  . Anemia   . Arthritis   . Asthma    adult onset  . Basal cell cancer    LUE; Porokeratosis also  . Chronic kidney disease 1963   strep in kidney due to strep throat-hospitalized 10 days  . Complication of anesthesia    small trachea  . Diabetes mellitus 2010   A1c 6.7%  . DVT (deep venous thrombosis) ([place]) 2006   post immobilization post cns surgery  . GERD (gastroesophageal reflux disease)    very mild  . Granulomatous lung disease ([place]) 2002   incidental Xray finding  . Hyperlipidemia   . Hypertension 2004   Hypertensive response on Stress Test  . Paralysis ([place]) 2006   post cervical fusion with spinal sac tear  with hematoma   . PTE (pulmonary thromboembolism) ([place]) 2006   Past Surgical History:  Procedure Laterality Date  . ABDOMINAL EXPOSURE N/A 08/12/2016   Procedure: ABDOMINAL EXPOSURE;  Surgeon: Rosetta Posner, MD;  Location: [facility] OR;  Service: Vascular;  Laterality: N/A;  . ANTERIOR LAT  LUMBAR FUSION N/A 08/12/2016   Procedure: LUMBAR TWO-THREE, LUMBAR THREE-FOUR, LUMBAR FOUR-FIVE  ANTEROLATERAL LUMBAR INTERBODY FUSION;  Surgeon: Erline Levine, MD;  Location: [place];  Service: Neurosurgery;  Laterality: N/A;  L2-3 L3-4 L4-5 Anterolateral lumbar interbody fusion  . ANTERIOR LUMBAR FUSION N/A 08/12/2016   Procedure: Lumbar Five-Sacral One Anterior lumbar interbody fusion with Dr. Sherren Mocha Early to assist;  Surgeon: Erline Levine, MD;  Location: [place];  Service: Neurosurgery;  Laterality: N/A;  L5-S1 Anterior lumbar interbody fusion with Dr. Sherren Mocha Early to assist  . BUNIONECTOMY     . CATARACT EXTRACTION Right 12/31/2018  . CATARACT EXTRACTION Left 12/17/2018  . CERVICAL FUSION  2006   Dr Annette Stable, NS;post op hematoma & cns leak & urinary retention  . COLONOSCOPY  1992 & 2002   negative  . epidural steroids  2006   cervical spine  . LUMBAR PERCUTANEOUS PEDICLE SCREW 4 LEVEL N/A 08/12/2016   Procedure: LUMBAR TWO-SACRAL ONE Percuataneous Pedicle Screws;  Surgeon: Erline Levine, MD;  Location: [place];  Service: Neurosurgery;  Laterality: N/A;  . ROTATOR CUFF REPAIR  2009   Right  . SEPTOPLASTY    . TONSILLECTOMY  78 years old  . TOTAL HIP ARTHROPLASTY  06/01/2012   Procedure: TOTAL HIP ARTHROPLASTY ANTERIOR APPROACH;  Surgeon: Mcarthur Rossetti, MD;  Location: [facility];  Service: Orthopedics;  Laterality: Left;  Left Total Hip Arthroplasty  . TOTAL HIP ARTHROPLASTY Right 02/23/2018   Procedure: RIGHT TOTAL HIP ARTHROPLASTY ANTERIOR APPROACH;  Surgeon: Mcarthur Rossetti, MD;  Location: [facility];  Service: Orthopedics;  Laterality: Right;  . TUBAL LIGATION     Family History  Problem Relation Age of Onset  . COPD Father        emphysema  . Cancer Mother        cns cancer  . Diabetes Sister        TWIN sister ; also Fibromyalgia ; S/P stent 2004  . Heart disease Sister        stents @ 62 & 40  . Stroke Maternal Grandmother        in  late 24s  . Transient ischemic attack Paternal Aunt    Social History   Socioeconomic History  . Marital status: Divorced    Spouse name: Not on file  . Number of children: Not on file  . Years of education: Not on file  . Highest education level: Not on file  Occupational History  . Not on file  Social Needs  . Financial resource strain: Not on file  . Food insecurity    Worry: Not on file    Inability: Not on file  . Transportation needs    Medical: Not on file    Non-medical: Not on file  Tobacco Use  . Smoking status: Never Smoker  . Smokeless tobacco: Never Used  Substance and Sexual Activity  . Alcohol use: Yes     Alcohol/week: 1.0 standard drinks    Types: 1 Glasses of wine per week    Comment: socially  . Drug use: No  . Sexual activity: Never  Lifestyle  . Physical activity    Days per week: Not on file    Minutes per session: Not on file  . Stress: Not on file  Relationships  . Social Herbalist on phone: Not on file    Gets together: Not on file    Attends religious service: Not on file    Active member of  club or organization: Not on file    Attends meetings of clubs or organizations: Not on file    Relationship status: Not on file  Other Topics Concern  . Not on file  Social History Narrative  . Not on file    Outpatient Encounter Medications as of 04/19/2019  Medication Sig  . acetaminophen (TYLENOL) 650 MG CR tablet Take 650-1,300 mg by mouth 2 (two) times daily as needed for pain.  [name] albuterol (VENTOLIN HFA) 108 (90 Base) MCG/ACT inhaler INHALE 2 PUFFS INTO THE LUNGS EVERY 6 HOURS AS NEEDED FOR WHEEZING OR SHORTNESS OF BREATH  . alendronate (FOSAMAX) 70 MG tablet Take 1 tablet (70 mg total) by mouth once a week.  [name] aspirin 81 MG chewable tablet Chew 1 tablet (81 mg total) by mouth 2 (two) times daily. (Patient taking differently: Chew 81 mg by mouth daily. )  . Azelastine-Fluticasone (DYMISTA) 137-50 MCG/ACT SUSP Place 2 sprays into the nose 2 (two) times daily as needed (congestion).   . Calcium Carbonate (CALCIUM 600 PO) Take 0.5 tablets by mouth daily.  . Calcium Citrate-Vitamin D (CALCIUM + D PO) Take 1 tablet by mouth daily.  . celecoxib (CELEBREX) 200 MG capsule TAKE 1 CAPSULE(200 MG) BY MOUTH DAILY  . cholecalciferol (VITAMIN D) 1000 UNITS tablet Take 1,000 Units by mouth daily.   . cyclobenzaprine (FLEXERIL) 10 MG tablet TAKE 1 TABLET(10 MG) BY MOUTH THREE TIMES DAILY AS NEEDED FOR MUSCLE SPASMS  . ezetimibe (ZETIA) 10 MG tablet TAKE 1 TABLET(10 MG) BY MOUTH DAILY  . FEROSUL 325 (65 Fe) MG tablet TAKE 1 TABLET(325 MG) BY MOUTH DAILY WITH BREAKFAST  .  Fluticasone-Salmeterol (ADVAIR DISKUS) 250-50 MCG/DOSE AEPB Inhale 1 puff into the lungs 2 (two) times daily. USE 1 INHALATION TWO TIMES  DAILY ; RINSE MOUTH  . Fluticasone-Salmeterol (ADVAIR DISKUS) 250-50 MCG/DOSE AEPB Inhale 1 puff into the lungs 2 (two) times daily.  . Lidocaine (ASPERCREME LIDOCAINE) 4 % PTCH Apply 1 patch topically as needed (back pain).  . metFORMIN (GLUCOPHAGE-XR) 500 MG 24 hr tablet Take 2 tablets by mouth daily  . Multiple Vitamin (MULTIVITAMIN WITH MINERALS) TABS tablet Take 1 tablet by mouth daily.  . Multiple Vitamins-Minerals (OCUVITE EXTRA PO) Take 1 tablet by mouth daily.  . mupirocin ointment (BACTROBAN) 2 % Apply 1 application topically daily as needed (hip immflamation).   . MYRBETRIQ 50 MG TB24 tablet TAKE 1 TABLET(50 MG) BY MOUTH DAILY (Patient taking differently: Take 50 mg by mouth daily as needed (cellulitis). )  . Polyethyl Glycol-Propyl Glycol (SYSTANE OP) Place 1 drop into both eyes daily as needed (dry eyes).  . polyethylene glycol (MIRALAX / GLYCOLAX) packet Take 17 g by mouth daily as needed for mild constipation.   . pregabalin (LYRICA) 100 MG capsule TAKE 1 CAPSULE(100 MG) BY MOUTH THREE TIMES DAILY  . traMADol (ULTRAM) 50 MG tablet TAKE 1 TABLET BY MOUTH EVERY 6 HOURS AS NEEDED (Patient taking differently: Take 50 mg by mouth every 6 (six) hours as needed for moderate pain. )  . Trolamine Salicylate (ASPERCREME EX) Apply 1 application topically daily as needed (back pain).   No facility-administered encounter medications on file as of 04/19/2019.     Activities of Daily Living In your present state of health, do you have any difficulty performing the following activities: 04/19/2019 07/06/2018  Hearing? N N  Vision? N N  Difficulty concentrating or making decisions? N N  Walking or climbing stairs? N N  Dressing or bathing? N N  Doing errands, shopping? N N  Preparing Food and eating ? N -  Comment Eats at [employer], but states she can cook.  -  Using the Toilet? N -  In the past six months, have you accidently leaked urine? N -  Do you have problems with loss of bowel control? N -  Managing your Medications? N -  Managing your Finances? N -  Housekeeping or managing your Housekeeping? N -  Some recent data might be hidden    Patient Care Team: Carollee Herter, Alferd Apa, DO as PCP - General (Family Medicine) Deneise Lever, MD as Consulting Physician (Pulmonary Disease) Erline Levine, MD as Consulting Physician (Neurosurgery) Marlou Sa, Tonna Corner, MD as Consulting Physician (Orthopedic Surgery) Mcarthur Rossetti, MD as Consulting Physician (Orthopedic Surgery) Danella Sensing, MD as Consulting Physician (Dermatology)    Assessment:   This is a routine wellness examination for [facility]. Physical assessment deferred to PCP.  Exercise Activities and Dietary recommendations Current Exercise Habits: Home exercise routine, Type of exercise: stretching, Time (Minutes): 60, Frequency (Times/Week): 3, Weekly Exercise (Minutes/Week): 180, Intensity: Mild, Exercise limited by: None identified Diet (meal preparation, eat out, water intake, caffeinated beverages, dairy products, fruits and vegetables): in general, a "healthy" diet  , well balanced   Goals    . DIET - INCREASE WATER INTAKE    . Have overall increased strength       Fall Risk Fall Risk  04/19/2019 12/11/2017 12/05/2016 11/24/2015 06/12/2015  Falls in the past year? 0 No No No No  Number falls in past yr: 0 - - - -  Injury with Fall? 0 - - - -    Depression Screen PHQ 2/9 Scores 04/19/2019 12/11/2017 12/11/2017 12/05/2016  PHQ - 2 Score 0 0 0 0     Cognitive Function Ad8 score reviewed for issues:  Issues making decisions:no  Less interest in hobbies / activities:no  Repeats questions, stories (family complaining):no  Trouble using ordinary gadgets (microwave, computer, phone):no  Forgets the month or year: no  Mismanaging finances: no  Remembering appts:no   Daily problems with thinking and/or memory:no Ad8 score is=0     MMSE - Mini Mental State Exam 12/05/2016  Orientation to time 5  Orientation to Place 5  Registration 3  Attention/ Calculation 4  Recall 3  Language- name 2 objects 2  Language- repeat 1  Language- follow 3 step command 3  Language- read & follow direction 1  Write a sentence 1  Copy design 1  Total score 29        Immunization History  Administered Date(s) Administered  . Fluad Quad(high Dose 65+) 01/28/2019  . Influenza Inj Mdck Quad With Preservative 03/23/2018  . Influenza Split 03/15/2012  . Influenza Whole 02/27/2009  . Influenza, High Dose Seasonal PF 03/19/2013, 03/21/2016  . Influenza,inj,Quad PF,6+ Mos 03/13/2014, 02/16/2015  . Influenza-Unspecified 03/15/2012, 01/28/2014, 03/13/2014, 02/16/2015, 02/14/2017  . Pneumococcal Conjugate-13 05/16/2013  . Pneumococcal Polysaccharide-23 09/29/2014  . Tdap 10/28/2010  . Zoster 05/12/2014   Screening Tests Health Maintenance  Topic Date Due  . FOOT EXAM  06/08/2018  . URINE MICROALBUMIN  12/12/2018  . HEMOGLOBIN A1C  07/28/2019  . OPHTHALMOLOGY EXAM  01/08/2020  . MAMMOGRAM  02/14/2020  . TETANUS/TDAP  10/27/2020  . INFLUENZA VACCINE  Completed  . DEXA SCAN  Completed  . PNA vac Low Risk Adult  Completed       Plan:    Please schedule your next medicare wellness visit with me in  1 yr.  Continue to eat heart healthy diet (full of fruits, vegetables, whole grains, lean protein, water--limit salt, fat, and sugar intake) and increase physical activity as tolerated.  Continue doing brain stimulating activities (puzzles, reading, adult coloring books, staying active) to keep memory sharp.     I have personally reviewed and noted the following in the patient's chart:   . Medical and social history . Use of alcohol, tobacco or illicit drugs  . Current medications and supplements . Functional ability and status . Nutritional status .  Physical activity . Advanced directives . List of other physicians . Hospitalizations, surgeries, and ER visits in previous 12 months . Vitals . Screenings to include cognitive, depression, and falls . Referrals and appointments  In addition, I have reviewed and discussed with patient certain preventive protocols, quality metrics, and best practice recommendations. A written personalized care plan for preventive services as well as general preventive health recommendations were provided to patient.     Shela Nevin, [place]  04/19/2019

## 2019-04-19 ENCOUNTER — Ambulatory Visit (INDEPENDENT_AMBULATORY_CARE_PROVIDER_SITE_OTHER): Payer: Medicare Other | Admitting: *Deleted

## 2019-04-19 ENCOUNTER — Other Ambulatory Visit: Payer: Self-pay

## 2019-04-19 ENCOUNTER — Encounter: Payer: Self-pay | Admitting: *Deleted

## 2019-04-19 DIAGNOSIS — Z0000 Encounter for general adult medical examination without abnormal findings: Secondary | ICD-10-CM

## 2019-04-19 NOTE — Patient Instructions (Signed)
Please schedule your next medicare wellness visit with me in 1 yr.  Continue to eat heart healthy diet (full of fruits, vegetables, whole grains, lean protein, water--limit salt, fat, and sugar intake) and increase physical activity as tolerated.  Continue doing brain stimulating activities (puzzles, reading, adult coloring books, staying active) to keep memory sharp.    [name] , Thank you for taking time to come for your Medicare Wellness Visit. I appreciate your ongoing commitment to your health goals. Please review the following plan we discussed and let me know if I can assist you in the future.   These are the goals we discussed: Goals    . DIET - INCREASE WATER INTAKE    . Have overall increased strength       This is a list of the screening recommended for you and due dates:  Health Maintenance  Topic Date Due  . Complete foot exam   06/08/2018  . Urine Protein Check  12/12/2018  . Hemoglobin A1C  07/28/2019  . Eye exam for diabetics  01/08/2020  . Mammogram  02/14/2020  . Tetanus Vaccine  10/27/2020  . Flu Shot  Completed  . DEXA scan (bone density measurement)  Completed  . Pneumonia vaccines  Completed    Preventive Care 68 Years and Older, Female Preventive care refers to lifestyle choices and visits with your health care provider that can promote health and wellness. This includes:  A yearly physical exam. This is also called an annual well check.  Regular dental and eye exams.  Immunizations.  Screening for certain conditions.  Healthy lifestyle choices, such as diet and exercise. What can I expect for my preventive care visit? Physical exam Your health care provider will check:  Height and weight. These may be used to calculate body mass index (BMI), which is a measurement that tells if you are at a healthy weight.  Heart rate and blood pressure.  Your skin for abnormal spots. Counseling Your health care provider may ask you questions about:   Alcohol, tobacco, and drug use.  Emotional well-being.  Home and relationship well-being.  Sexual activity.  Eating habits.  History of falls.  Memory and ability to understand (cognition).  Work and work Statistician.  Pregnancy and menstrual history. What immunizations do I need?  Influenza (flu) vaccine  This is recommended every year. Tetanus, diphtheria, and pertussis (Tdap) vaccine  You may need a Td booster every 10 years. Varicella (chickenpox) vaccine  You may need this vaccine if you have not already been vaccinated. Zoster (shingles) vaccine  You may need this after age 78. Pneumococcal conjugate (PCV13) vaccine  One dose is recommended after age 78. Pneumococcal polysaccharide (PPSV23) vaccine  One dose is recommended after age 78. Measles, mumps, and rubella (MMR) vaccine  You may need at least one dose of MMR if you were born in 1957 or later. You may also need a second dose. Meningococcal conjugate (MenACWY) vaccine  You may need this if you have certain conditions. Hepatitis A vaccine  You may need this if you have certain conditions or if you travel or work in places where you may be exposed to hepatitis A. Hepatitis B vaccine  You may need this if you have certain conditions or if you travel or work in places where you may be exposed to hepatitis B. Haemophilus influenzae type b (Hib) vaccine  You may need this if you have certain conditions. You may receive vaccines as individual doses or as more  than one vaccine together in one shot (combination vaccines). Talk with your health care provider about the risks and benefits of combination vaccines. What tests do I need? Blood tests  Lipid and cholesterol levels. These may be checked every 5 years, or more frequently depending on your overall health.  Hepatitis C test.  Hepatitis B test. Screening  Lung cancer screening. You may have this screening every year starting at age 78 if you have  a 30-pack-year history of smoking and currently smoke or have quit within the past 15 years.  Colorectal cancer screening. All adults should have this screening starting at age 78 and continuing until age 101. Your health care provider may recommend screening at age 19 if you are at increased risk. You will have tests every 1-10 years, depending on your results and the type of screening test.  Diabetes screening. This is done by checking your blood sugar (glucose) after you have not eaten for a while (fasting). You may have this done every 1-3 years.  Mammogram. This may be done every 1-2 years. Talk with your health care provider about how often you should have regular mammograms.  BRCA-related cancer screening. This may be done if you have a family history of breast, ovarian, tubal, or peritoneal cancers. Other tests  Sexually transmitted disease (STD) testing.  Bone density scan. This is done to screen for osteoporosis. You may have this done starting at age 78. Follow these instructions at home: Eating and drinking  Eat a diet that includes fresh fruits and vegetables, whole grains, lean protein, and low-fat dairy products. Limit your intake of foods with high amounts of sugar, saturated fats, and salt.  Take vitamin and mineral supplements as recommended by your health care provider.  Do not drink alcohol if your health care provider tells you not to drink.  If you drink alcohol: ? Limit how much you have to 0-1 drink a day. ? Be aware of how much alcohol is in your drink. In the [place] one drink equals one 12 oz bottle of beer (355 mL), one 5 oz glass of wine (148 mL), or one 1 oz glass of hard liquor (44 mL). Lifestyle  Take daily care of your teeth and gums.  Stay active. Exercise for at least 30 minutes on 5 or more days each week.  Do not use any products that contain nicotine or tobacco, such as cigarettes, e-cigarettes, and chewing tobacco. If you need help quitting, ask  your health care provider.  If you are sexually active, practice safe sex. Use a condom or other form of protection in order to prevent STIs (sexually transmitted infections).  Talk with your health care provider about taking a low-dose aspirin or statin. What's next?  Go to your health care provider once a year for a well check visit.  Ask your health care provider how often you should have your eyes and teeth checked.  Stay up to date on all vaccines. This information is not intended to replace advice given to you by your health care provider. Make sure you discuss any questions you have with your health care provider. Document Released: 06/12/2015 Document Revised: 05/10/2018 Document Reviewed: 05/10/2018 Elsevier Patient Education  2020 [employer].

## 2019-05-03 ENCOUNTER — Telehealth: Payer: Self-pay | Admitting: Family Medicine

## 2019-05-03 DIAGNOSIS — M545 Low back pain, unspecified: Secondary | ICD-10-CM

## 2019-05-03 NOTE — Telephone Encounter (Signed)
Patient states pharmacy reached out  2 weeks ago requesting cyclobenzaprine (FLEXERIL) 10 MG , patient states she has 1 pills [place], [place]

## 2019-05-07 ENCOUNTER — Other Ambulatory Visit: Payer: Self-pay | Admitting: Family Medicine

## 2019-05-07 ENCOUNTER — Telehealth: Payer: Self-pay | Admitting: *Deleted

## 2019-05-07 DIAGNOSIS — M545 Low back pain, unspecified: Secondary | ICD-10-CM

## 2019-05-07 DIAGNOSIS — N3281 Overactive bladder: Secondary | ICD-10-CM

## 2019-05-07 MED ORDER — CYCLOBENZAPRINE HCL 10 MG PO TABS: ORAL_TABLET | ORAL | 1 refills | Status: DC

## 2019-05-07 MED ORDER — MIRABEGRON ER 50 MG PO TB24: ORAL_TABLET | ORAL | 5 refills | Status: DC

## 2019-05-07 NOTE — Telephone Encounter (Signed)
We did not get a refill request from the pharmacy--- I refilled it today

## 2019-05-07 NOTE — Telephone Encounter (Signed)
Pt notified of refill

## 2019-05-07 NOTE — Telephone Encounter (Signed)
Received fax from [facility] requesting Myrbetriq refill. Refill sent.

## 2019-05-13 ENCOUNTER — Other Ambulatory Visit: Payer: Self-pay | Admitting: Family Medicine

## 2019-05-13 DIAGNOSIS — E785 Hyperlipidemia, unspecified: Secondary | ICD-10-CM

## 2019-05-13 DIAGNOSIS — E1169 Type 2 diabetes mellitus with other specified complication: Secondary | ICD-10-CM

## 2019-05-27 ENCOUNTER — Ambulatory Visit (INDEPENDENT_AMBULATORY_CARE_PROVIDER_SITE_OTHER): Payer: Medicare Other | Admitting: Internal Medicine

## 2019-05-27 ENCOUNTER — Other Ambulatory Visit: Payer: Self-pay

## 2019-05-27 ENCOUNTER — Encounter: Payer: Self-pay | Admitting: Internal Medicine

## 2019-05-27 DIAGNOSIS — J302 Other seasonal allergic rhinitis: Secondary | ICD-10-CM | POA: Diagnosis not present

## 2019-05-27 DIAGNOSIS — J4520 Mild intermittent asthma, uncomplicated: Secondary | ICD-10-CM | POA: Diagnosis not present

## 2019-05-27 DIAGNOSIS — J189 Pneumonia, unspecified organism: Secondary | ICD-10-CM

## 2019-05-27 DIAGNOSIS — J3089 Other allergic rhinitis: Secondary | ICD-10-CM | POA: Diagnosis not present

## 2019-05-27 MED ORDER — AZELASTINE-FLUTICASONE 137-50 MCG/ACT NA SUSP: NASAL | 12 refills | Status: DC

## 2019-05-27 NOTE — Patient Instructions (Signed)
Script was sent refilling Dymista nasal spray ( combination of azelastine and fluticasone).  Ok to call for refills of your albuterol rescue inhaler and Advair as needed.  Please call if we can help.

## 2019-05-27 NOTE — Progress Notes (Signed)
HPI female never smoker followed for allergic rhinitis, asthma, complicated by history embolic CVA complicating C-spine surgery, DM 2  ---------------------------------------------------------------------   05/25/2018- 78 year old female never smoker followed for allergic rhinitis, asthma, complicated by history embolic CVA complicating C-spine surgery, DM 2 Follows for: asthma, allergic rhinitis  Pro-air, Dymista, Advair 250 She has been changed from Advair to [employer].  She says it is not quite as good, but sufficient.  Very rare need for rescue inhaler.  Has had flu vaccine.  Living now at [place].  Had no respiratory problems with hip/low back surgery earlier this year and is now more mobile.  05/27/2019- Virtual Visit via Telephone Note  I connected with [name] on 05/27/19 at 10:00 AM EST by telephone and verified that I am speaking with the correct person using two identifiers.  Location: Patient: H Provider: O   I discussed the limitations, risks, security and privacy concerns of performing an evaluation and management service by telephone and the availability of in person appointments. I also discussed with the patient that there may be a patient responsible charge related to this service. The patient expressed understanding and agreed to proceed.   History of Present Illness: Asthma78 year old female never smoker followed for allergic rhinitis, asthma, complicated by history embolic CVA complicating C-spine surgery, DM 2 Follows for: asthma, allergic rhinitis  Ventolin hfa, Dymista, Advair 250, Dymista,  -----f/u Asthma. Patient stated breathig is at 7  Feels she "isn't getting enough air" through her nose and asks for refill of Dymista. Little cough or wheeze and continues Advair. No edema or palpitation.  Had flu vax. No increased use of rescue inhaler. No fever or purulent sputum.   Observations/Objective: CXR 07/19/2018-  IMPRESSION: Near complete  resolution of the previously seen left basilar airspace process. Resolved left pleural effusion.  Assessment and Plan: Asthma- seems controlled on current meds Rhinitis- Ok to refill Dymsita, consider nasal saline  Follow Up Instructions: 1 year   I discussed the assessment and treatment plan with the patient. The patient was provided an opportunity to ask questions and all were answered. The patient agreed with the plan and demonstrated an understanding of the instructions.   The patient was advised to call back or seek an in-person evaluation if the symptoms worsen or if the condition fails to improve as anticipated.  I provided 17 minutes of non-face-to-face time during this encounter.   Baird Lyons, MD    ROV-see HPI + = positive Constitutional:   No-   weight loss, night sweats, fevers, chills, fatigue, lassitude. HEENT:   No-  headaches, difficulty swallowing, tooth/dental problems, sore throat,       No- sneezing, itching, ear ache, +nasal congestion, post nasal drip,  CV:  chest pain, no-orthopnea, PND, swelling in lower extremities, anasarca, dizziness, palpitations Resp: No-   shortness of breath with exertion or at rest.              No-   productive cough,  No non-productive cough,  No- coughing up of blood.              No-   change in color of mucus.  + Wheezing.   Skin: No-   rash or lesions. GI:  GU:  MS:  No-   joint pain or swelling.  No- decreased range of motion.  Back pain after surgery Neuro-     nothing unusual Psych:  No- change in mood or affect. No depression or anxiety.  No memory  loss.  OBJ General- Alert, Oriented, Affect-appropriate, Distress- none acute, looks very well Skin- rash-none, lesions- none, excoriation- none Lymphadenopathy- none Head- atraumatic            Eyes- Gross vision intact, PERRLA, conjunctivae clear secretions            Ears- Hearing, canals-normal            Nose- Clear, no-Septal dev, mucus, polyps, erosion,  perforation             Throat- Mallampati II , mucosa clear , drainage- none, tonsils- atrophic,  Neck- flexible , trachea midline, no stridor , thyroid nl, carotid no bruit Chest - symmetrical excursion , unlabored           Heart/CV- RRR , no murmur , no gallop  , no rub, nl s1 s2                           - JVD- none , edema- none, stasis changes- none, varices- none           Lung-  Wheeze- none, cough-none , dullness-none, rub- none           Chest wall-  Abd-  Br/ Gen/ Rectal- Not done, not indicated Extrem- cyanosis- none, clubbing, none, atrophy- none, strength- nl, + cane Neuro- grossly intact to observation

## 2019-05-28 ENCOUNTER — Ambulatory Visit: Payer: Medicare Other | Attending: Internal Medicine

## 2019-05-28 DIAGNOSIS — Z20822 Contact with and (suspected) exposure to covid-19: Secondary | ICD-10-CM

## 2019-05-29 ENCOUNTER — Other Ambulatory Visit: Payer: Self-pay | Admitting: Family Medicine

## 2019-05-29 DIAGNOSIS — E1140 Type 2 diabetes mellitus with diabetic neuropathy, unspecified: Secondary | ICD-10-CM

## 2019-05-29 LAB — NOVEL CORONAVIRUS, NAA: SARS-CoV-2, NAA: NOT DETECTED

## 2019-05-29 MED ORDER — METFORMIN HCL ER 500 MG PO TB24: ORAL_TABLET | ORAL | 2 refills | Status: DC

## 2019-05-29 MED ORDER — PREGABALIN 100 MG PO CAPS: ORAL_CAPSULE | ORAL | 1 refills | Status: DC

## 2019-05-29 NOTE — Telephone Encounter (Signed)
Requested medication (s) are due for refill today:  yes  Requested medication (s) are on the active medication list: yes  Last refill:  10/12/2018  Future visit scheduled: yes  Notes to clinic: This refill cannot be delegated    Requested Prescriptions  Pending Prescriptions Disp Refills   pregabalin (LYRICA) 100 MG capsule 270 capsule 1    Sig: TAKE 1 CAPSULE(100 MG) BY MOUTH THREE TIMES DAILY      Not Delegated - Neurology:  Anticonvulsants - Controlled Failed - 05/29/2019  3:13 PM      Failed - This refill cannot be delegated      Passed - Valid encounter within last 12 months    Recent Outpatient Visits           4 months ago Iron deficiency anemia, unspecified iron deficiency anemia type   Archivist at [place], DO   7 months ago Acute leg pain, left   Archivist at [place], DO   10 months ago [employer] acquired pneumonia, unspecified laterality   Archivist at [place], DO   11 months ago Hyperlipidemia associated with type 2 diabetes mellitus ([place])   Archivist at [place], DO   1 year ago Iron deficiency anemia, unspecified iron deficiency anemia type   Archivist at [place], DO       Future Appointments             In 1 month [place], [place] at [facility], [place]   In 10 months [place], [place], Research scientist (physical sciences) at [facility], [place]             Signed Prescriptions Disp Refills   metFORMIN (GLUCOPHAGE-XR) 500 MG 24 hr tablet 60 tablet 2    Sig: Take 2 tablets by mouth daily      Endocrinology:  Diabetes - Biguanides Failed - 05/29/2019  3:13 PM      Failed - eGFR in normal range and within 360 days    GFR calc Af Amer   Date Value Ref Range Status  07/07/2018 >60 >60 mL/min Final   GFR calc non Af Amer  Date Value Ref Range Status  07/07/2018 >60 >60 mL/min Final   GFR  Date Value Ref Range Status  01/28/2019 56.21 (L) >60.00 mL/min Final          Passed - Cr in normal range and within 360 days    Creatinine, Ser  Date Value Ref Range Status  01/28/2019 0.96 0.40 - 1.20 mg/dL Final   Creatinine,U  Date Value Ref Range Status  12/11/2017 107.7 mg/dL Final          Passed - HBA1C is between 0 and 7.9 and within 180 days    Hgb A1c MFr Bld  Date Value Ref Range Status  01/28/2019 7.0 (H) 4.6 - 6.5 % Final    Comment:    Glycemic Control Guidelines for People with Diabetes:Non Diabetic:  <6%Goal of Therapy: <7%Additional Action Suggested:  >8%           Passed - Valid encounter within last 6 months    Recent Outpatient Visits           4 months ago Iron  deficiency anemia, unspecified iron deficiency anemia type   Archivist at [place], [place]   7 months ago Acute leg pain, left   Archivist at [place], [place]   10 months ago [employer] acquired pneumonia, unspecified laterality   Archivist at [place], [place]   11 months ago Hyperlipidemia associated with type 2 diabetes mellitus ([place])   Archivist at [place], DO   1 year ago Iron deficiency anemia, unspecified iron deficiency anemia type   Archivist at [place], DO       Future Appointments             In 1 month [place], DO [employer] at [facility], [place]   In 10 months [place], [place], Research scientist (physical sciences) at [facility], [place]

## 2019-05-29 NOTE — Telephone Encounter (Signed)
Requesting:Lyrica Contract:none [id number] Last Visit:01/28/2019 Next Visit:07/16/2019 Last Refill:10/15/2018  Please Advise

## 2019-05-29 NOTE — Telephone Encounter (Signed)
Medication Refill - Medication: metFORMIN (GLUCOPHAGE-XR) 500 MG 24 hr tablet pregabalin (LYRICA) 100 MG capsule  Has the patient contacted their pharmacy? Yes - states she requested this from pharmacy 13 days ago with no response from the office. (Agent: If no, request that the patient contact the pharmacy for the refill.) (Agent: If yes, when and what did the pharmacy advise?)  Preferred Pharmacy (with phone number or street name):  [facility] [place], [address] AT [address] [place] Phone:  [phone]  Fax:  [phone]     Agent: Please be advised that RX refills may take up to 3 business days. We ask that you follow-up with your pharmacy.

## 2019-05-30 ENCOUNTER — Telehealth: Payer: Self-pay | Admitting: *Deleted

## 2019-05-30 ENCOUNTER — Other Ambulatory Visit: Payer: Self-pay | Admitting: *Deleted

## 2019-05-30 NOTE — Telephone Encounter (Signed)
Opened in error

## 2019-05-30 NOTE — Telephone Encounter (Signed)
Patient called given negative covid results . 

## 2019-06-20 ENCOUNTER — Encounter: Payer: Self-pay | Admitting: Internal Medicine

## 2019-06-20 NOTE — Assessment & Plan Note (Signed)
She describes dyspnea as a sense of more work to breathe through a stuffy nose and asks for Dymista nasal spray , which we can do. No indication of infection described.

## 2019-06-20 NOTE — Assessment & Plan Note (Signed)
Describes asthma as controlled now. Will call for Advair refill when needed

## 2019-06-20 NOTE — Assessment & Plan Note (Signed)
Resolved as of CXR 07/19/2018

## 2019-07-10 DIAGNOSIS — L565 Disseminated superficial actinic porokeratosis (DSAP): Secondary | ICD-10-CM | POA: Diagnosis not present

## 2019-07-10 DIAGNOSIS — D692 Other nonthrombocytopenic purpura: Secondary | ICD-10-CM | POA: Diagnosis not present

## 2019-07-10 DIAGNOSIS — L570 Actinic keratosis: Secondary | ICD-10-CM | POA: Diagnosis not present

## 2019-07-16 ENCOUNTER — Ambulatory Visit: Payer: Medicare Other | Admitting: Family Medicine

## 2019-07-17 ENCOUNTER — Other Ambulatory Visit: Payer: Self-pay | Admitting: *Deleted

## 2019-07-17 DIAGNOSIS — M545 Low back pain, unspecified: Secondary | ICD-10-CM

## 2019-07-17 DIAGNOSIS — D509 Iron deficiency anemia, unspecified: Secondary | ICD-10-CM

## 2019-07-17 MED ORDER — FERROUS SULFATE 325 (65 FE) MG PO TABS: ORAL_TABLET | ORAL | 1 refills | Status: DC

## 2019-07-17 MED ORDER — CYCLOBENZAPRINE HCL 10 MG PO TABS: ORAL_TABLET | ORAL | 1 refills | Status: DC

## 2019-08-01 ENCOUNTER — Other Ambulatory Visit: Payer: Self-pay

## 2019-08-01 ENCOUNTER — Other Ambulatory Visit: Payer: Self-pay | Admitting: *Deleted

## 2019-08-01 MED ORDER — ALENDRONATE SODIUM 70 MG PO TABS: 70.0000 mg | ORAL_TABLET | ORAL | 3 refills | Status: DC

## 2019-08-02 ENCOUNTER — Encounter: Payer: Self-pay | Admitting: Family Medicine

## 2019-08-02 ENCOUNTER — Other Ambulatory Visit: Payer: Self-pay

## 2019-08-02 ENCOUNTER — Ambulatory Visit (INDEPENDENT_AMBULATORY_CARE_PROVIDER_SITE_OTHER): Payer: Medicare Other | Admitting: Family Medicine

## 2019-08-02 VITALS — BP 120/62 | HR 56 | Temp 97.0°F | Resp 18 | Ht 64.0 in | Wt 163.0 lb

## 2019-08-02 DIAGNOSIS — E1169 Type 2 diabetes mellitus with other specified complication: Secondary | ICD-10-CM

## 2019-08-02 DIAGNOSIS — E1165 Type 2 diabetes mellitus with hyperglycemia: Secondary | ICD-10-CM | POA: Diagnosis not present

## 2019-08-02 DIAGNOSIS — N3281 Overactive bladder: Secondary | ICD-10-CM | POA: Diagnosis not present

## 2019-08-02 DIAGNOSIS — E1140 Type 2 diabetes mellitus with diabetic neuropathy, unspecified: Secondary | ICD-10-CM | POA: Diagnosis not present

## 2019-08-02 DIAGNOSIS — E785 Hyperlipidemia, unspecified: Secondary | ICD-10-CM | POA: Diagnosis not present

## 2019-08-02 DIAGNOSIS — M1990 Unspecified osteoarthritis, unspecified site: Secondary | ICD-10-CM | POA: Diagnosis not present

## 2019-08-02 DIAGNOSIS — I10 Essential (primary) hypertension: Secondary | ICD-10-CM

## 2019-08-02 LAB — COMPREHENSIVE METABOLIC PANEL
ALT: 23 U/L (ref 0–35)
AST: 20 U/L (ref 0–37)
Albumin: 4 g/dL (ref 3.5–5.2)
Alkaline Phosphatase: 68 U/L (ref 39–117)
BUN: 13 mg/dL (ref 6–23)
CO2: 26 mEq/L (ref 19–32)
Calcium: 10 mg/dL (ref 8.4–10.5)
Chloride: 105 mEq/L (ref 96–112)
Creatinine, Ser: 0.9 mg/dL (ref 0.40–1.20)
GFR: 60.48 mL/min (ref >60.00)
Glucose, Bld: 129 mg/dL — ABNORMAL HIGH (ref 70–99)
Potassium: 4.3 mEq/L (ref 3.5–5.1)
Sodium: 139 mEq/L (ref 135–145)
Total Bilirubin: 0.4 mg/dL (ref 0.2–1.2)
Total Protein: 7 g/dL (ref 6.0–8.3)

## 2019-08-02 LAB — LIPID PANEL
Cholesterol: 177 mg/dL (ref 0–200)
HDL: 45.3 mg/dL (ref >39.00)
Total CHOL/HDL Ratio: 4
Triglycerides: 421 mg/dL — ABNORMAL HIGH (ref 0.0–149.0)

## 2019-08-02 LAB — MICROALBUMIN / CREATININE URINE RATIO
Creatinine,U: 44.8 mg/dL
Microalb Creat Ratio: 1.6 mg/g (ref 0.0–30.0)
Microalb, Ur: <0.7 mg/dL (ref 0.0–1.9)

## 2019-08-02 LAB — LDL CHOLESTEROL, DIRECT: Direct LDL: 66 mg/dL

## 2019-08-02 LAB — HEMOGLOBIN A1C: Hgb A1c MFr Bld: 6.9 % — ABNORMAL HIGH (ref 4.6–6.5)

## 2019-08-02 MED ORDER — MIRABEGRON ER 50 MG PO TB24: ORAL_TABLET | ORAL | 5 refills | Status: DC

## 2019-08-02 MED ORDER — TRAMADOL HCL 50 MG PO TABS: 50.0000 mg | ORAL_TABLET | Freq: Four times a day (QID) | ORAL | 1 refills | Status: DC | PRN

## 2019-08-02 NOTE — Assessment & Plan Note (Signed)
Tolerating statin, encouraged heart healthy diet, avoid trans fats, minimize simple carbs and saturated fats. Increase exercise as tolerated 

## 2019-08-02 NOTE — Assessment & Plan Note (Signed)
hgba1c to be checked, minimize simple carbs. Increase exercise as tolerated. Continue current meds  

## 2019-08-02 NOTE — Progress Notes (Signed)
Patient ID: [name], female    DOB: 1940-07-15  Age: 79 y.o. MRN: [id number]    Subjective:  Subjective  HPI GEORGANA STROM presents for f/u bp , chol and dm  HYPERTENSION   Blood pressure range-not checking   Chest pain- no      Dyspnea- no Lightheadedness- no   Edema- no  Other side effects - no   Medication compliance: good Low salt diet- yes     DIABETES    Blood Sugar ranges-90s   Polyuria- no New Visual problems- no  Hypoglycemic symptoms- no  Other side effects-no Medication compliance - good Last eye exam- next month--utd Foot exam- today   HYPERLIPIDEMIA  Medication compliance- good RUQ pain- no  Muscle aches- no Other side effects-no      Review of Systems  Constitutional: Negative for appetite change, diaphoresis, fatigue and unexpected weight change.  Eyes: Negative for pain, redness and visual disturbance.  Respiratory: Negative for cough, chest tightness, shortness of breath and wheezing.   Cardiovascular: Negative for chest pain, palpitations and leg swelling.  Endocrine: Negative for cold intolerance, heat intolerance, polydipsia, polyphagia and polyuria.  Genitourinary: Negative for difficulty urinating, dysuria and frequency.  Neurological: Negative for dizziness, light-headedness, numbness and headaches.    History Past Medical History:  Diagnosis Date  . Anemia   . Arthritis   . Asthma    adult onset  . Basal cell cancer    LUE; Porokeratosis also  . Chronic kidney disease 1963   strep in kidney due to strep throat-hospitalized 10 days  . Complication of anesthesia    small trachea  . Diabetes mellitus 2010   A1c 6.7%  . DVT (deep venous thrombosis) ([place]) 2006   post immobilization post cns surgery  . GERD (gastroesophageal reflux disease)    very mild  . Granulomatous lung disease ([place]) 2002   incidental Xray finding  . Hyperlipidemia   . Hypertension 2004   Hypertensive response on Stress Test  . Paralysis ([place]) 2006   post  cervical fusion with spinal sac tear  with hematoma   . PTE (pulmonary thromboembolism) ([place]) 2006    She has a past surgical history that includes Bunionectomy; Septoplasty; Tubal ligation; Colonoscopy (1992 & 2002); Rotator cuff repair (2009); epidural steroids (2006); Cervical fusion (2006); Tonsillectomy (79 years old); Total hip arthroplasty (06/01/2012); Anterior lumbar fusion (N/A, 08/12/2016); Anterior lat lumbar fusion (N/A, 08/12/2016); Lumbar percutaneous pedicle screw 4 level (N/A, 08/12/2016); Abdominal exposure (N/A, 08/12/2016); Total hip arthroplasty (Right, 02/23/2018); Cataract extraction (Right, 12/31/2018); and Cataract extraction (Left, 12/17/2018).   Her family history includes COPD in her father; Cancer in her mother; Diabetes in her sister; Heart disease in her sister; Stroke in her maternal grandmother; Transient ischemic attack in her paternal aunt.She reports that she has never smoked. She has never used smokeless tobacco. She reports current alcohol use of about 1.0 standard drinks of alcohol per week. She reports that she does not use drugs.  Current Outpatient Medications on File Prior to Visit  Medication Sig Dispense Refill  . acetaminophen (TYLENOL) 650 MG CR tablet Take 650-1,300 mg by mouth 2 (two) times daily as needed for pain.    [name] albuterol (VENTOLIN HFA) 108 (90 Base) MCG/ACT inhaler INHALE 2 PUFFS INTO THE LUNGS EVERY 6 HOURS AS NEEDED FOR WHEEZING OR SHORTNESS OF BREATH 25.5 g 0  . alendronate (FOSAMAX) 70 MG tablet Take 1 tablet (70 mg total) by mouth once a week. 12 tablet 3  .  aspirin 81 MG chewable tablet Chew 1 tablet (81 mg total) by mouth 2 (two) times daily. (Patient taking differently: Chew 81 mg by mouth daily. ) 30 tablet 0  . Azelastine-Fluticasone (DYMISTA) 137-50 MCG/ACT SUSP 1-2 puffs each nostril once or twice daily 23 g 12  . Calcium Carbonate (CALCIUM 600 PO) Take 0.5 tablets by mouth daily.    . Calcium Citrate-Vitamin D (CALCIUM + D PO) Take 1  tablet by mouth daily.    . celecoxib (CELEBREX) 200 MG capsule TAKE 1 CAPSULE(200 MG) BY MOUTH DAILY 90 capsule 1  . cholecalciferol (VITAMIN D) 1000 UNITS tablet Take 1,000 Units by mouth daily.     . cyclobenzaprine (FLEXERIL) 10 MG tablet TAKE 1 TABLET(10 MG) BY MOUTH THREE TIMES DAILY AS NEEDED FOR MUSCLE SPASMS 30 tablet 1  . ezetimibe (ZETIA) 10 MG tablet Take 1 tablet (10 mg total) by mouth daily. 90 tablet 0  . ferrous sulfate (FEROSUL) 325 (65 FE) MG tablet TAKE 1 TABLET(325 MG) BY MOUTH DAILY WITH BREAKFAST 90 tablet 1  . Fluticasone-Salmeterol (ADVAIR DISKUS) 250-50 MCG/DOSE AEPB Inhale 1 puff into the lungs 2 (two) times daily. USE 1 INHALATION TWO TIMES  DAILY ; RINSE MOUTH 60 each 5  . Fluticasone-Salmeterol (ADVAIR DISKUS) 250-50 MCG/DOSE AEPB Inhale 1 puff into the lungs 2 (two) times daily. 180 each 11  . Lidocaine (ASPERCREME LIDOCAINE) 4 % PTCH Apply 1 patch topically as needed (back pain).    . metFORMIN (GLUCOPHAGE-XR) 500 MG 24 hr tablet Take 2 tablets by mouth daily 60 tablet 2  . Multiple Vitamin (MULTIVITAMIN WITH MINERALS) TABS tablet Take 1 tablet by mouth daily.    . Multiple Vitamins-Minerals (OCUVITE EXTRA PO) Take 1 tablet by mouth daily.    . mupirocin ointment (BACTROBAN) 2 % Apply 1 application topically daily as needed (hip immflamation).     Vladimir Faster Glycol-Propyl Glycol (SYSTANE OP) Place 1 drop into both eyes daily as needed (dry eyes).    . polyethylene glycol (MIRALAX / GLYCOLAX) packet Take 17 g by mouth daily as needed for mild constipation.     . pregabalin (LYRICA) 100 MG capsule TAKE 1 CAPSULE(100 MG) BY MOUTH THREE TIMES DAILY 270 capsule 1  . Trolamine Salicylate (ASPERCREME EX) Apply 1 application topically daily as needed (back pain).     No current facility-administered medications on file prior to visit.     Objective:  Objective  Physical Exam BP 120/62 (BP Location: Left Arm, Patient Position: Sitting, Cuff Size: Normal)   Pulse (!) 56    Temp (!) 97 F (36.1 C) (Temporal)   Resp 18   Ht 5\' 4"  (1.626 m)   Wt 163 lb (73.9 kg)   SpO2 97%   BMI 27.98 kg/m  Wt Readings from Last 3 Encounters:  08/02/19 163 lb (73.9 kg)  01/28/19 162 lb 3.2 oz (73.6 kg)  07/19/18 153 lb 12.8 oz (69.8 kg)     Lab Results  Component Value Date   WBC 8.6 01/28/2019   HGB 12.4 01/28/2019   HCT 37.5 01/28/2019   PLT 218.0 01/28/2019   GLUCOSE 119 (H) 01/28/2019   CHOL 189 01/28/2019   TRIG 305.0 (H) 01/28/2019   HDL 46.80 01/28/2019   LDLDIRECT 95.0 01/28/2019   LDLCALC 99 06/14/2018   ALT 18 01/28/2019   AST 19 01/28/2019   NA 140 01/28/2019   K 4.3 01/28/2019   CL 103 01/28/2019   CREATININE 0.96 01/28/2019   BUN 16 01/28/2019  CO2 27 01/28/2019   TSH 5.38 (H) 06/03/2016   INR 0.96 05/28/2012   HGBA1C 7.0 (H) 01/28/2019   MICROALBUR <0.7 12/11/2017    MM DIAG BREAST TOMO BILATERAL  Result Date: 02/14/2019 CLINICAL DATA:  Here for evaluation of diffuse left breast pain/tenderness which has been present for approximately 1 year. EXAM: DIGITAL DIAGNOSTIC BILATERAL MAMMOGRAM WITH CAD AND TOMO COMPARISON:  Previous exam(s). ACR Breast Density Category b: There are scattered areas of fibroglandular density. FINDINGS: No findings are identified to explain the patient's symptoms. No suspicious mass, microcalcification, or other finding is identified in either breast. Mammographic images were processed with CAD. IMPRESSION: No findings to explain the patient's symptoms. No evidence of malignancy. RECOMMENDATION: Any further workup of the patient's symptoms should be based on the clinical assessment. Recommend routine annual screening mammogram in 1 year. I have discussed the findings and recommendations with the patient. If applicable, a reminder letter will be sent to the patient regarding the next appointment. BI-RADS CATEGORY  1: Negative. Electronically Signed   By: Zerita Boers M.D.   On: 02/14/2019 09:01     Assessment & Plan:   Plan  I have changed [place] traMADol. I am also having her maintain her cholecalciferol, Multiple Vitamins-Minerals (OCUVITE EXTRA PO), multivitamin with minerals, Trolamine Salicylate (ASPERCREME EX), Polyethyl Glycol-Propyl Glycol (SYSTANE OP), Calcium Citrate-Vitamin D (CALCIUM + D PO), acetaminophen, aspirin, polyethylene glycol, mupirocin ointment, Lidocaine, Fluticasone-Salmeterol, celecoxib, Calcium Carbonate (CALCIUM 600 PO), Fluticasone-Salmeterol, albuterol, ezetimibe, Azelastine-Fluticasone, metFORMIN, pregabalin, cyclobenzaprine, ferrous sulfate, alendronate, and mirabegron ER.  Meds ordered this encounter  Medications  . mirabegron ER (MYRBETRIQ) 50 MG TB24 tablet    Sig: TAKE 1 TABLET(50 MG) BY MOUTH DAILY    Dispense:  30 tablet    Refill:  5  . traMADol (ULTRAM) 50 MG tablet    Sig: Take 1 tablet (50 mg total) by mouth every 6 (six) hours as needed.    Dispense:  60 tablet    Refill:  1    Problem List Items Addressed This Visit      Unprioritized   Essential hypertension    Well controlled, no changes to meds. Encouraged heart healthy diet such as the DASH diet and exercise as tolerated.       Hyperlipidemia associated with type 2 diabetes mellitus ([place])    Tolerating statin, encouraged heart healthy diet, avoid trans fats, minimize simple carbs and saturated fats. Increase exercise as tolerated      Relevant Orders   Lipid panel   Comprehensive metabolic panel   Osteoarthritis - Primary    Refill meds  stable      Relevant Medications   traMADol (ULTRAM) 50 MG tablet   Type 2 diabetes mellitus with diabetic neuropathy ([place])    hgba1c to be checked , minimize simple carbs. Increase exercise as tolerated. Continue current meds        Other Visit Diagnoses    OAB (overactive bladder)       Relevant Medications   mirabegron ER (MYRBETRIQ) 50 MG TB24 tablet   Type 2 diabetes mellitus with hyperglycemia, without long-term current use of insulin ([facility])        Relevant Orders   Lipid panel   Hemoglobin A1c   Comprehensive metabolic panel   Microalbumin / creatinine urine ratio      Follow-up: Return in about 6 months (around 02/02/2020), or if symptoms worsen or fail to improve, for hypertension, hyperlipidemia, diabetes II, fasting, annual exam.  Rosalita Chessman  Chase, DO

## 2019-08-02 NOTE — Assessment & Plan Note (Signed)
Refill meds stable 

## 2019-08-02 NOTE — Patient Instructions (Signed)
Carbohydrate Counting for Diabetes Mellitus, Adult  Carbohydrate counting is a method of keeping track of how many carbohydrates you eat. Eating carbohydrates naturally increases the amount of sugar (glucose) in the blood. Counting how many carbohydrates you eat helps keep your blood glucose within normal limits, which helps you manage your diabetes (diabetes mellitus). It is important to know how many carbohydrates you can safely have in each meal. This is different for every person. A diet and nutrition specialist (registered dietitian) can help you make a meal plan and calculate how many carbohydrates you should have at each meal and snack. Carbohydrates are found in the following foods:  Grains, such as breads and cereals.  Dried beans and soy products.  Starchy vegetables, such as potatoes, peas, and corn.  Fruit and fruit juices.  Milk and yogurt.  Sweets and snack foods, such as cake, cookies, candy, chips, and soft drinks. How do I count carbohydrates? There are two ways to count carbohydrates in food. You can use either of the methods or a combination of both. Reading "Nutrition Facts" on packaged food The "Nutrition Facts" list is included on the labels of almost all packaged foods and beverages in the [place] It includes:  The serving size.  Information about nutrients in each serving, including the grams (g) of carbohydrate per serving. To use the "Nutrition Facts":  Decide how many servings you will have.  Multiply the number of servings by the number of carbohydrates per serving.  The resulting number is the total amount of carbohydrates that you will be having. Learning standard serving sizes of other foods When you eat carbohydrate foods that are not packaged or do not include "Nutrition Facts" on the label, you need to measure the servings in order to count the amount of carbohydrates:  Measure the foods that you will eat with a food scale or measuring cup, if  needed.  Decide how many standard-size servings you will eat.  Multiply the number of servings by 15. Most carbohydrate-rich foods have about 15 g of carbohydrates per serving. ? For example, if you eat 8 oz (170 g) of strawberries, you will have eaten 2 servings and 30 g of carbohydrates (2 servings x 15 g = 30 g).  For foods that have more than one food mixed, such as soups and casseroles, you must count the carbohydrates in each food that is included. The following list contains standard serving sizes of common carbohydrate-rich foods. Each of these servings has about 15 g of carbohydrates:   hamburger bun or  English muffin.   oz (15 mL) syrup.   oz (14 g) jelly.  1 slice of bread.  1 six-inch tortilla.  3 oz (85 g) cooked rice or pasta.  4 oz (113 g) cooked dried beans.  4 oz (113 g) starchy vegetable, such as peas, corn, or potatoes.  4 oz (113 g) hot cereal.  4 oz (113 g) mashed potatoes or  of a large baked potato.  4 oz (113 g) canned or frozen fruit.  4 oz (120 mL) fruit juice.  4-6 crackers.  6 chicken nuggets.  6 oz (170 g) unsweetened dry cereal.  6 oz (170 g) plain fat-free yogurt or yogurt sweetened with artificial sweeteners.  8 oz (240 mL) milk.  8 oz (170 g) fresh fruit or one small piece of fruit.  24 oz (680 g) popped popcorn. Example of carbohydrate counting Sample meal  3 oz (85 g) chicken breast.  6 oz (170 g)   brown rice.  4 oz (113 g) corn.  8 oz (240 mL) milk.  8 oz (170 g) strawberries with sugar-free whipped topping. Carbohydrate calculation 1. Identify the foods that contain carbohydrates: ? Rice. ? Corn. ? Milk. ? Strawberries. 2. Calculate how many servings you have of each food: ? 2 servings rice. ? 1 serving corn. ? 1 serving milk. ? 1 serving strawberries. 3. Multiply each number of servings by 15 g: ? 2 servings rice x 15 g = 30 g. ? 1 serving corn x 15 g = 15 g. ? 1 serving milk x 15 g = 15 g. ? 1  serving strawberries x 15 g = 15 g. 4. Add together all of the amounts to find the total grams of carbohydrates eaten: ? 30 g + 15 g + 15 g + 15 g = 75 g of carbohydrates total. Summary  Carbohydrate counting is a method of keeping track of how many carbohydrates you eat.  Eating carbohydrates naturally increases the amount of sugar (glucose) in the blood.  Counting how many carbohydrates you eat helps keep your blood glucose within normal limits, which helps you manage your diabetes.  A diet and nutrition specialist (registered dietitian) can help you make a meal plan and calculate how many carbohydrates you should have at each meal and snack. This information is not intended to replace advice given to you by your health care provider. Make sure you discuss any questions you have with your health care provider. Document Revised: 12/08/2016 Document Reviewed: 10/28/2015 Elsevier Patient Education  2020 Elsevier Inc.  

## 2019-08-02 NOTE — Assessment & Plan Note (Signed)
Well controlled, no changes to meds. Encouraged heart healthy diet such as the DASH diet and exercise as tolerated.  °

## 2019-08-05 ENCOUNTER — Other Ambulatory Visit: Payer: Self-pay | Admitting: Family Medicine

## 2019-08-05 DIAGNOSIS — E1169 Type 2 diabetes mellitus with other specified complication: Secondary | ICD-10-CM

## 2019-08-13 ENCOUNTER — Other Ambulatory Visit: Payer: Self-pay | Admitting: Internal Medicine

## 2019-08-13 MED ORDER — FLUTICASONE-SALMETEROL 250-50 MCG/DOSE IN AEPB: 1.0000 | INHALATION_SPRAY | Freq: Two times a day (BID) | RESPIRATORY_TRACT | 4 refills | Status: DC

## 2019-08-29 ENCOUNTER — Telehealth: Payer: Self-pay | Admitting: Internal Medicine

## 2019-08-29 NOTE — Telephone Encounter (Signed)
Spoke with patient. She stated that she called [facility] to request a refill on her Advair 250 diskus but they stated they did not have a RX on file. Several days later, she received a shipment from [facility] but it was for the [facility] inhaler. She called them back to advise them of the mistake and sent the [place] back. She has now received the Advair.   I did advise her that a RX was sent to [facility] on March 16th for a 90 day supply with 3 refills. She verbalized understanding. Nothing further needed at time of call.

## 2019-09-03 ENCOUNTER — Telehealth: Payer: Self-pay | Admitting: Family Medicine

## 2019-09-03 DIAGNOSIS — M24575 Contracture, left foot: Secondary | ICD-10-CM | POA: Diagnosis not present

## 2019-09-03 DIAGNOSIS — M2042 Other hammer toe(s) (acquired), left foot: Secondary | ICD-10-CM | POA: Diagnosis not present

## 2019-09-03 DIAGNOSIS — L84 Corns and callosities: Secondary | ICD-10-CM | POA: Diagnosis not present

## 2019-09-03 DIAGNOSIS — M79672 Pain in left foot: Secondary | ICD-10-CM | POA: Diagnosis not present

## 2019-09-03 MED ORDER — METFORMIN HCL ER 500 MG PO TB24: ORAL_TABLET | ORAL | 2 refills | Status: DC

## 2019-09-03 NOTE — Telephone Encounter (Signed)
Medication: metFORMIN (GLUCOPHAGE-XR) 500 MG 24 hr tablet ezetimibe (ZETIA) 10 MG tablet [id number]    Has the patient contacted their pharmacy? Yes.   (If no, request that the patient contact the pharmacy for the refill.) (If yes, when and what did the pharmacy advise?)  Preferred Pharmacy (with phone number or street name): [facility] [place], [address] AT [address] [facility]  [place], [place] [address]  Phone:  [phone] Fax:  [phone]  DEA #:  [id number]  Agent: Please be advised that RX refills may take up to 3 business days. We ask that you follow-up with your pharmacy.

## 2019-09-03 NOTE — Telephone Encounter (Signed)
Refill sent.

## 2019-09-09 ENCOUNTER — Other Ambulatory Visit: Payer: Self-pay | Admitting: Family Medicine

## 2019-09-09 ENCOUNTER — Telehealth: Payer: Self-pay

## 2019-09-09 DIAGNOSIS — E1169 Type 2 diabetes mellitus with other specified complication: Secondary | ICD-10-CM

## 2019-09-09 MED ORDER — EZETIMIBE 10 MG PO TABS: 10.0000 mg | ORAL_TABLET | Freq: Every day | ORAL | 0 refills | Status: DC

## 2019-09-09 NOTE — Telephone Encounter (Signed)
Pt is in need of refill on Zetia 10 mg.  Pharmacy is [facility] @[address]

## 2019-09-09 NOTE — Telephone Encounter (Signed)
I sent in the zetia

## 2019-09-22 IMAGING — MG MM DIGITAL DIAGNOSTIC BILAT W/ TOMO W/ CAD
8 series · 8 of 24 positions shown · non-contrast
Comparison: Previous exam(s).

CLINICAL DATA: Here for evaluation of diffuse left breast
pain/tenderness which has been present for approximately 1 year.

EXAM:
DIGITAL DIAGNOSTIC BILATERAL MAMMOGRAM WITH CAD AND TOMO

[L MLO synth-2D]
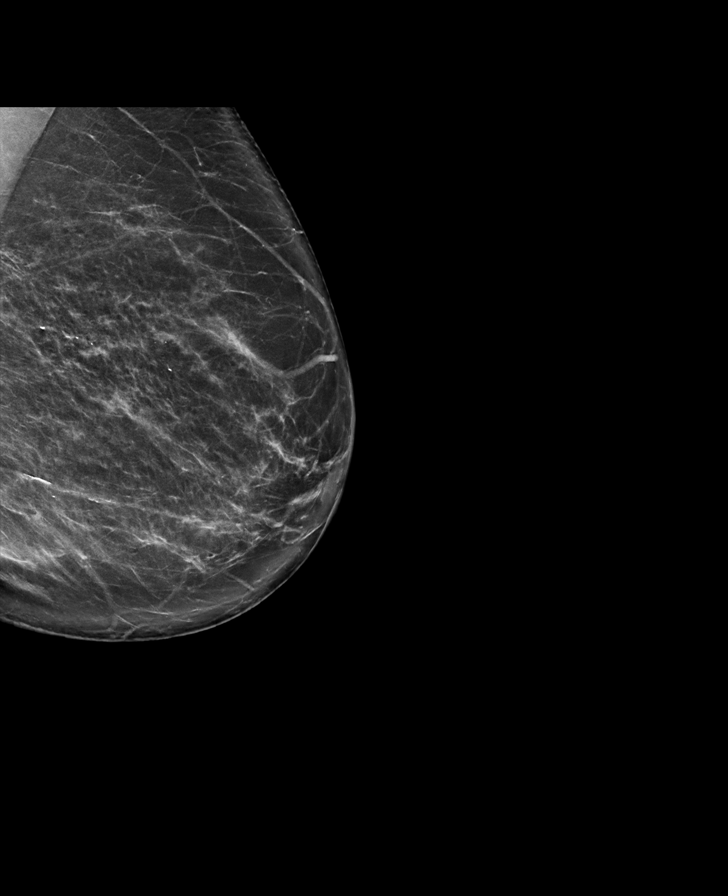

[R MLO synth-2D]
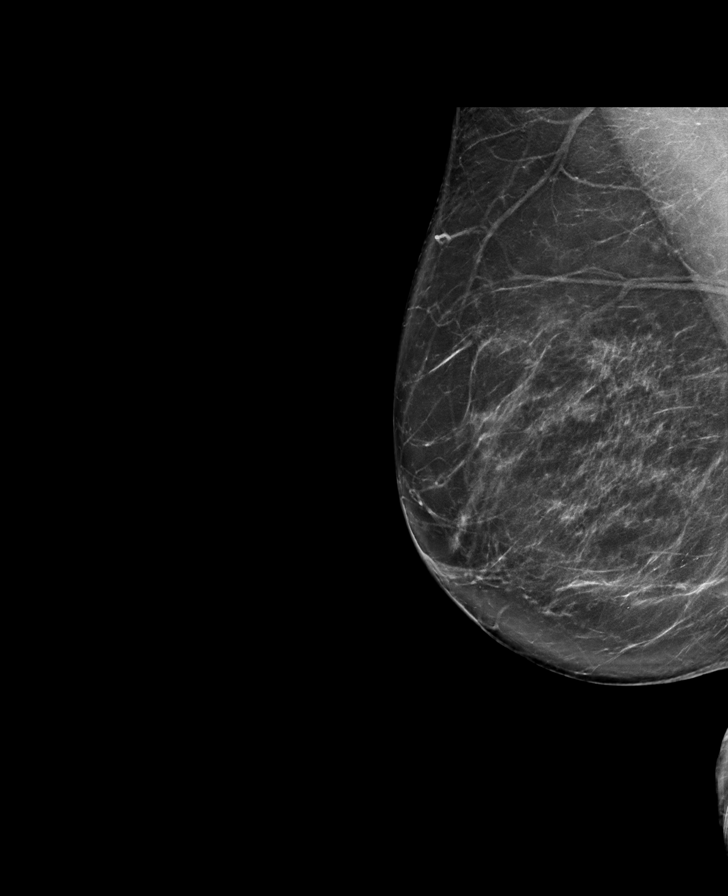

[L CC synth-2D]
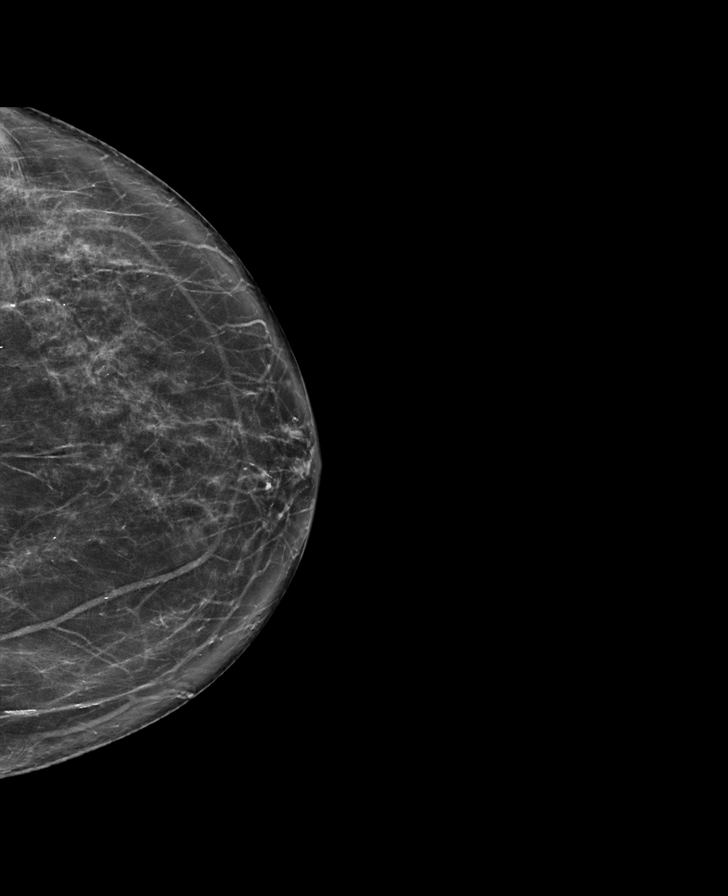

[R CC synth-2D]
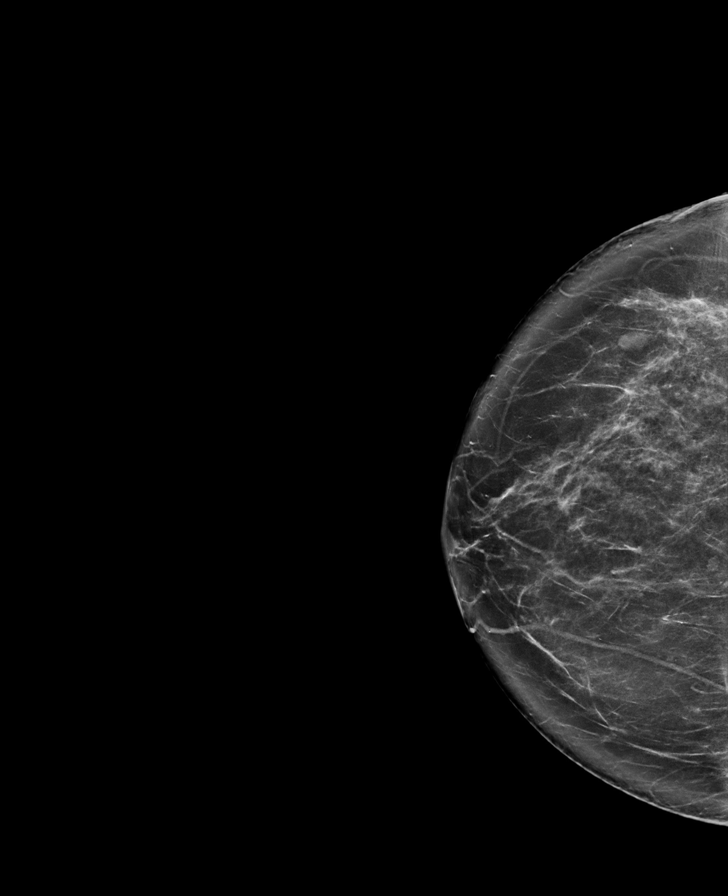

[R MLO tomo · tomo slice 47/94.0]
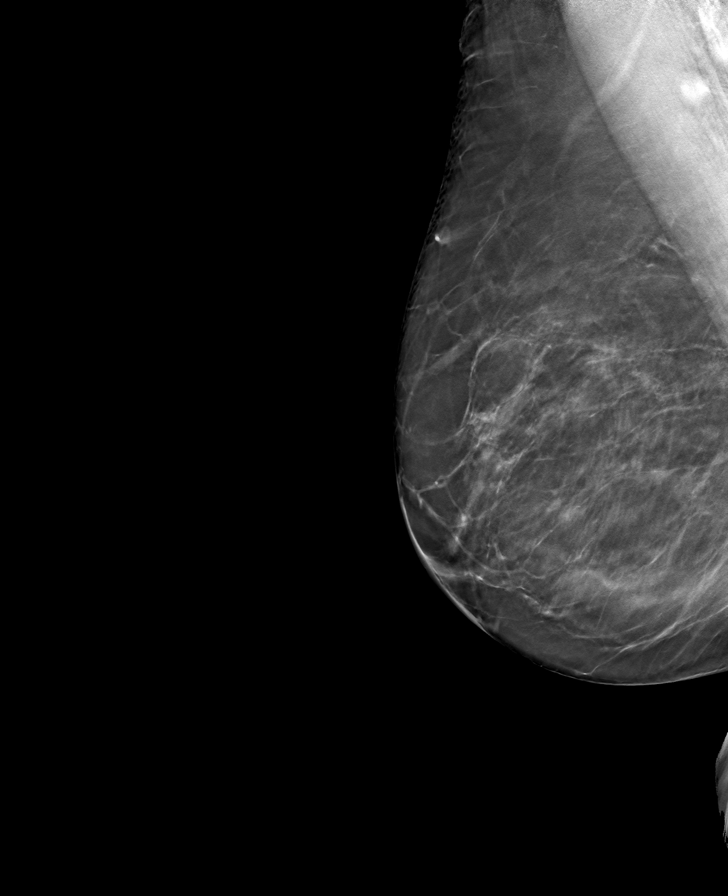

[R CC tomo · tomo slice 44/87.0]
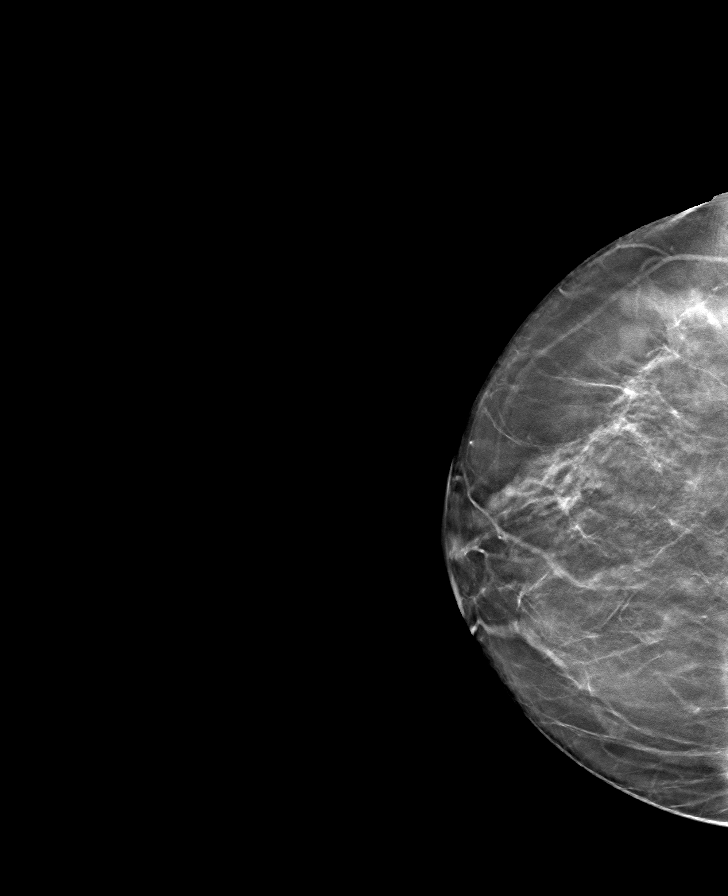

[L CC tomo · tomo slice 41/82.0]
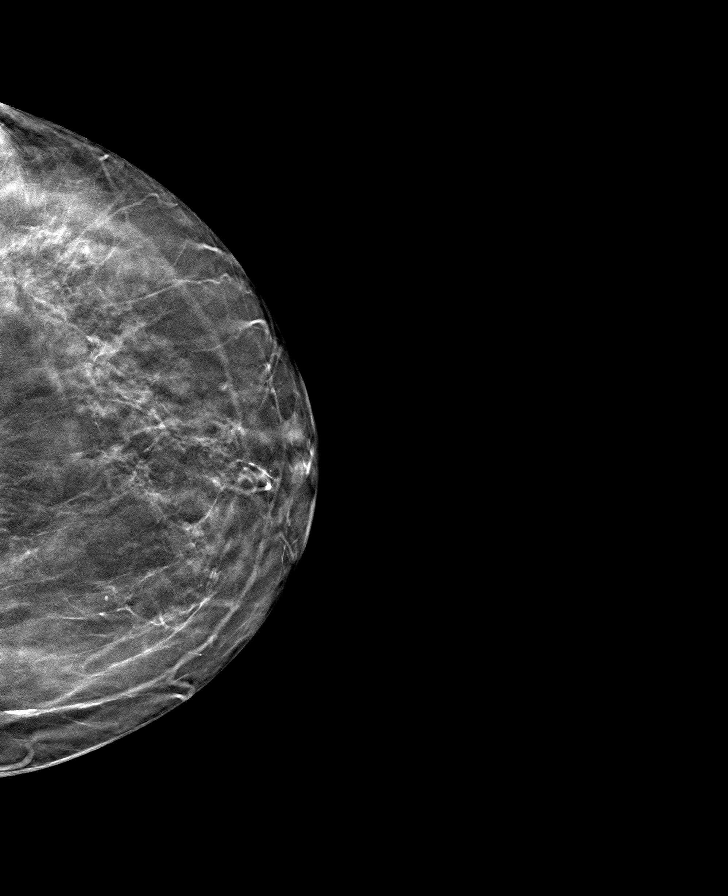

[L MLO tomo · tomo slice 45/90.0]
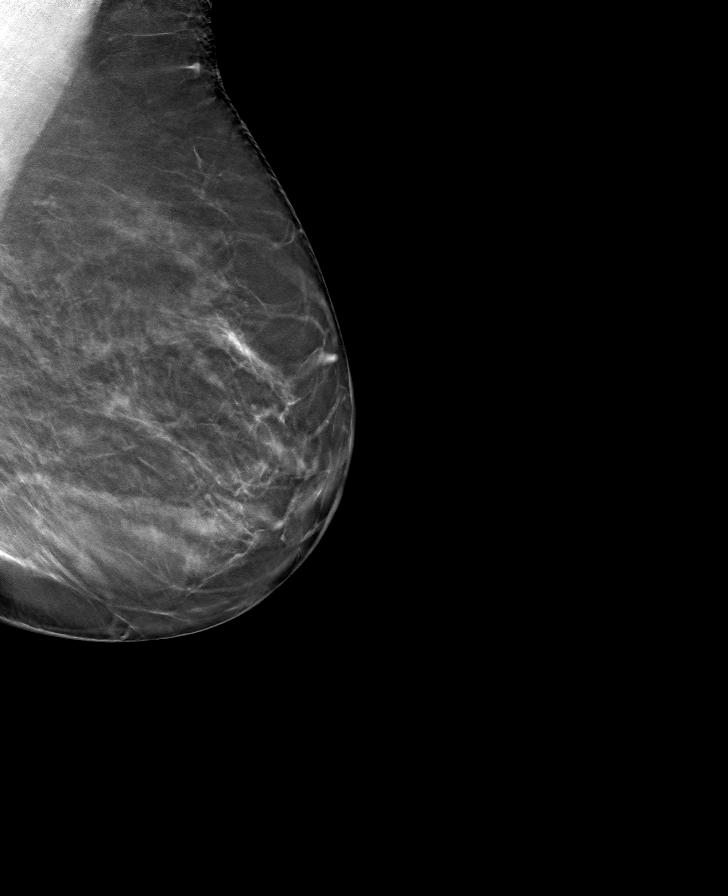

[8 of 24 positions shown; findings below may reference images not displayed]

ACR Breast Density Category b: There are scattered areas of
fibroglandular density.
FINDINGS: No findings are identified to explain the patient's symptoms. No
suspicious mass, microcalcification, or other finding is identified
in either breast.

Mammographic images were processed with CAD.
IMPRESSION: No findings to explain the patient's symptoms. No evidence of
malignancy.

RECOMMENDATION:
Any further workup of the patient's symptoms should be based on the
clinical assessment. Recommend routine annual screening mammogram in
1 year.

I have discussed the findings and recommendations with the patient.
If applicable, a reminder letter will be sent to the patient
regarding the next appointment.

BI-RADS CATEGORY  1: Negative.

## 2019-10-09 ENCOUNTER — Other Ambulatory Visit: Payer: Self-pay | Admitting: *Deleted

## 2019-10-09 DIAGNOSIS — M545 Low back pain, unspecified: Secondary | ICD-10-CM

## 2019-10-09 MED ORDER — CYCLOBENZAPRINE HCL 10 MG PO TABS: ORAL_TABLET | ORAL | 1 refills | Status: DC

## 2019-11-07 ENCOUNTER — Other Ambulatory Visit: Payer: Medicare Other

## 2019-11-08 ENCOUNTER — Other Ambulatory Visit: Payer: Self-pay

## 2019-11-08 DIAGNOSIS — E785 Hyperlipidemia, unspecified: Secondary | ICD-10-CM

## 2019-11-08 MED ORDER — EZETIMIBE 10 MG PO TABS: 10.0000 mg | ORAL_TABLET | Freq: Every day | ORAL | 0 refills | Status: DC

## 2019-11-13 ENCOUNTER — Other Ambulatory Visit: Payer: Self-pay | Admitting: Family Medicine

## 2019-11-13 DIAGNOSIS — E1140 Type 2 diabetes mellitus with diabetic neuropathy, unspecified: Secondary | ICD-10-CM

## 2019-11-13 DIAGNOSIS — M545 Low back pain, unspecified: Secondary | ICD-10-CM

## 2019-11-13 MED ORDER — CYCLOBENZAPRINE HCL 10 MG PO TABS: ORAL_TABLET | ORAL | 1 refills | Status: DC

## 2019-11-13 MED ORDER — METFORMIN HCL ER 500 MG PO TB24: ORAL_TABLET | ORAL | 2 refills | Status: DC

## 2019-11-13 NOTE — Telephone Encounter (Signed)
Requesting: [place] Contract: 12/11/2017 UDS: 12/11/2017, low risk Last OV: 08/02/2019 Next OV: 02/05/2020,  Last Refill: 1230/2020, #[id number] RF Database:   Please advise

## 2019-11-13 NOTE — Telephone Encounter (Signed)
Medication: pregabalin (LYRICA) 100 MG capsule [[id number]]    ezetimibe (ZETIA) 10 MG tablet [[id number]]   cyclobenzaprine (FLEXERIL) 10 MG tablet [[id number]  metFORMIN (GLUCOPHAGE-XR) 500 MG 24 hr tablet [[id number]    Has the patient contacted their pharmacy? No. (If no, request that the patient contact the pharmacy for the refill.) (If yes, when and what did the pharmacy advise?)  Preferred Pharmacy (with phone number or street name): [facility] [place], [address] [facility] [address]  [place], [place] [address]  Phone:  [phone] Fax:  [phone]  DEA #:  [id number]  Agent: Please be advised that RX refills may take up to 3 business days. We ask that you follow-up with your pharmacy.

## 2019-11-14 DIAGNOSIS — J45909 Unspecified asthma, uncomplicated: Secondary | ICD-10-CM | POA: Diagnosis not present

## 2019-11-14 DIAGNOSIS — M24575 Contracture, left foot: Secondary | ICD-10-CM | POA: Diagnosis not present

## 2019-11-14 DIAGNOSIS — M2042 Other hammer toe(s) (acquired), left foot: Secondary | ICD-10-CM | POA: Diagnosis not present

## 2019-11-14 MED ORDER — PREGABALIN 100 MG PO CAPS: ORAL_CAPSULE | ORAL | 1 refills | Status: DC

## 2019-11-20 DIAGNOSIS — M2042 Other hammer toe(s) (acquired), left foot: Secondary | ICD-10-CM | POA: Diagnosis not present

## 2019-11-20 DIAGNOSIS — M24575 Contracture, left foot: Secondary | ICD-10-CM | POA: Diagnosis not present

## 2019-12-12 DIAGNOSIS — M2042 Other hammer toe(s) (acquired), left foot: Secondary | ICD-10-CM | POA: Diagnosis not present

## 2019-12-12 DIAGNOSIS — M24575 Contracture, left foot: Secondary | ICD-10-CM | POA: Diagnosis not present

## 2020-01-03 DIAGNOSIS — H40013 Open angle with borderline findings, low risk, bilateral: Secondary | ICD-10-CM | POA: Diagnosis not present

## 2020-01-03 DIAGNOSIS — Z7984 Long term (current) use of oral hypoglycemic drugs: Secondary | ICD-10-CM | POA: Diagnosis not present

## 2020-01-03 DIAGNOSIS — E119 Type 2 diabetes mellitus without complications: Secondary | ICD-10-CM | POA: Diagnosis not present

## 2020-01-03 DIAGNOSIS — H04123 Dry eye syndrome of bilateral lacrimal glands: Secondary | ICD-10-CM | POA: Diagnosis not present

## 2020-01-03 DIAGNOSIS — H35433 Paving stone degeneration of retina, bilateral: Secondary | ICD-10-CM | POA: Diagnosis not present

## 2020-01-09 DIAGNOSIS — M2042 Other hammer toe(s) (acquired), left foot: Secondary | ICD-10-CM | POA: Diagnosis not present

## 2020-01-09 DIAGNOSIS — M24575 Contracture, left foot: Secondary | ICD-10-CM | POA: Diagnosis not present

## 2020-02-05 ENCOUNTER — Encounter: Payer: Medicare Other | Admitting: Family Medicine

## 2020-02-07 ENCOUNTER — Ambulatory Visit (INDEPENDENT_AMBULATORY_CARE_PROVIDER_SITE_OTHER): Payer: Medicare Other | Admitting: Family Medicine

## 2020-02-07 ENCOUNTER — Other Ambulatory Visit (HOSPITAL_BASED_OUTPATIENT_CLINIC_OR_DEPARTMENT_OTHER): Payer: Self-pay | Admitting: Family Medicine

## 2020-02-07 ENCOUNTER — Encounter: Payer: Self-pay | Admitting: Family Medicine

## 2020-02-07 ENCOUNTER — Other Ambulatory Visit: Payer: Self-pay

## 2020-02-07 VITALS — BP 130/72 | HR 67 | Temp 97.1°F | Resp 18 | Ht 64.0 in | Wt 162.4 lb

## 2020-02-07 DIAGNOSIS — M8580 Other specified disorders of bone density and structure, unspecified site: Secondary | ICD-10-CM

## 2020-02-07 DIAGNOSIS — Z1159 Encounter for screening for other viral diseases: Secondary | ICD-10-CM | POA: Diagnosis not present

## 2020-02-07 DIAGNOSIS — Z23 Encounter for immunization: Secondary | ICD-10-CM

## 2020-02-07 DIAGNOSIS — E1169 Type 2 diabetes mellitus with other specified complication: Secondary | ICD-10-CM | POA: Diagnosis not present

## 2020-02-07 DIAGNOSIS — E1165 Type 2 diabetes mellitus with hyperglycemia: Secondary | ICD-10-CM | POA: Diagnosis not present

## 2020-02-07 DIAGNOSIS — Z0000 Encounter for general adult medical examination without abnormal findings: Secondary | ICD-10-CM | POA: Diagnosis not present

## 2020-02-07 DIAGNOSIS — D509 Iron deficiency anemia, unspecified: Secondary | ICD-10-CM

## 2020-02-07 DIAGNOSIS — Z1231 Encounter for screening mammogram for malignant neoplasm of breast: Secondary | ICD-10-CM

## 2020-02-07 DIAGNOSIS — E785 Hyperlipidemia, unspecified: Secondary | ICD-10-CM

## 2020-02-07 MED ORDER — FERROUS SULFATE 325 (65 FE) MG PO TABS: ORAL_TABLET | ORAL | 1 refills | Status: DC

## 2020-02-07 MED ORDER — EZETIMIBE 10 MG PO TABS: 10.0000 mg | ORAL_TABLET | Freq: Every day | ORAL | 1 refills | Status: DC

## 2020-02-07 MED ORDER — METFORMIN HCL ER 500 MG PO TB24: ORAL_TABLET | ORAL | 1 refills | Status: DC

## 2020-02-07 NOTE — Patient Instructions (Signed)
Preventive Care 79 Years and Older, Female Preventive care refers to lifestyle choices and visits with your health care provider that can promote health and wellness. This includes:  A yearly physical exam. This is also called an annual well check.  Regular dental and eye exams.  Immunizations.  Screening for certain conditions.  Healthy lifestyle choices, such as diet and exercise. What can I expect for my preventive care visit? Physical exam Your health care provider will check:  Height and weight. These may be used to calculate body mass index (BMI), which is a measurement that tells if you are at a healthy weight.  Heart rate and blood pressure.  Your skin for abnormal spots. Counseling Your health care provider may ask you questions about:  Alcohol, tobacco, and drug use.  Emotional well-being.  Home and relationship well-being.  Sexual activity.  Eating habits.  History of falls.  Memory and ability to understand (cognition).  Work and work Statistician.  Pregnancy and menstrual history. What immunizations do I need?  Influenza (flu) vaccine  This is recommended every year. Tetanus, diphtheria, and pertussis (Tdap) vaccine  You may need a Td booster every 10 years. Varicella (chickenpox) vaccine  You may need this vaccine if you have not already been vaccinated. Zoster (shingles) vaccine  You may need this after age 79. Pneumococcal conjugate (PCV13) vaccine  One dose is recommended after age 79. Pneumococcal polysaccharide (PPSV23) vaccine  One dose is recommended after age 79. Measles, mumps, and rubella (MMR) vaccine  You may need at least one dose of MMR if you were born in 1957 or later. You may also need a second dose. Meningococcal conjugate (MenACWY) vaccine  You may need this if you have certain conditions. Hepatitis A vaccine  You may need this if you have certain conditions or if you travel or work in places where you may be exposed  to hepatitis A. Hepatitis B vaccine  You may need this if you have certain conditions or if you travel or work in places where you may be exposed to hepatitis B. Haemophilus influenzae type b (Hib) vaccine  You may need this if you have certain conditions. You may receive vaccines as individual doses or as more than one vaccine together in one shot (combination vaccines). Talk with your health care provider about the risks and benefits of combination vaccines. What tests do I need? Blood tests  Lipid and cholesterol levels. These may be checked every 5 years, or more frequently depending on your overall health.  Hepatitis C test.  Hepatitis B test. Screening  Lung cancer screening. You may have this screening every year starting at age 79 if you have a 30-pack-year history of smoking and currently smoke or have quit within the past 15 years.  Colorectal cancer screening. All adults should have this screening starting at age 79 and continuing until age 15. Your health care provider may recommend screening at age 23 if you are at increased risk. You will have tests every 1-10 years, depending on your results and the type of screening test.  Diabetes screening. This is done by checking your blood sugar (glucose) after you have not eaten for a while (fasting). You may have this done every 1-3 years.  Mammogram. This may be done every 1-2 years. Talk with your health care provider about how often you should have regular mammograms.  BRCA-related cancer screening. This may be done if you have a family history of breast, ovarian, tubal, or peritoneal cancers.  Other tests  Sexually transmitted disease (STD) testing.  Bone density scan. This is done to screen for osteoporosis. You may have this done starting at age 79. Follow these instructions at home: Eating and drinking  Eat a diet that includes fresh fruits and vegetables, whole grains, lean protein, and low-fat dairy products. Limit  your intake of foods with high amounts of sugar, saturated fats, and salt.  Take vitamin and mineral supplements as recommended by your health care provider.  Do not drink alcohol if your health care provider tells you not to drink.  If you drink alcohol: ? Limit how much you have to 0-1 drink a day. ? Be aware of how much alcohol is in your drink. In the [place] one drink equals one 12 oz bottle of beer (355 mL), one 5 oz glass of wine (148 mL), or one 1 oz glass of hard liquor (44 mL). Lifestyle  Take daily care of your teeth and gums.  Stay active. Exercise for at least 30 minutes on 5 or more days each week.  Do not use any products that contain nicotine or tobacco, such as cigarettes, e-cigarettes, and chewing tobacco. If you need help quitting, ask your health care provider.  If you are sexually active, practice safe sex. Use a condom or other form of protection in order to prevent STIs (sexually transmitted infections).  Talk with your health care provider about taking a low-dose aspirin or statin. What's next?  Go to your health care provider once a year for a well check visit.  Ask your health care provider how often you should have your eyes and teeth checked.  Stay up to date on all vaccines. This information is not intended to replace advice given to you by your health care provider. Make sure you discuss any questions you have with your health care provider. Document Revised: 05/10/2018 Document Reviewed: 05/10/2018 Elsevier Patient Education  2020 [employer].

## 2020-02-07 NOTE — Progress Notes (Signed)
Subjective:     [name] is a 79 y.o. female and is here for a comprehensive physical exam. The patient reports no new problems . She is seeing neuro surgery for injections in her back  HYPERTENSION   Blood pressure range-not checking   Chest pain- no      Dyspnea- no Lightheadedness- no   Edema- no  Other side effects - no   Medication compliance: good Low salt diet- yes    DIABETES    Blood Sugar ranges-105-135  Polyuria- no New Visual problems- no  Hypoglycemic symptoms- no  Other side effects-no Medication compliance - good Last eye exam- 12/2019 Foot exam- today   HYPERLIPIDEMIA  Medication compliance- good RUQ pain- no  Muscle aches- no Other side effects-no      Social History   Socioeconomic History  . Marital status: Divorced    Spouse name: Not on file  . Number of children: Not on file  . Years of education: Not on file  . Highest education level: Not on file  Occupational History  . Not on file  Tobacco Use  . Smoking status: Never Smoker  . Smokeless tobacco: Never Used  Vaping Use  . Vaping Use: Never used  Substance and Sexual Activity  . Alcohol use: Yes    Alcohol/week: 1.0 standard drink    Types: 1 Glasses of wine per week    Comment: socially  . Drug use: No  . Sexual activity: Never  Other Topics Concern  . Not on file  Social History Narrative  . Not on file   Social Determinants of Health   Financial Resource Strain:   . Difficulty of Paying Living Expenses: Not on file  Food Insecurity:   . Worried About Charity fundraiser in the Last Year: Not on file  . Ran Out of Food in the Last Year: Not on file  Transportation Needs:   . Lack of Transportation (Medical): Not on file  . Lack of Transportation (Non-Medical): Not on file  Physical Activity:   . Days of Exercise per Week: Not on file  . Minutes of Exercise per Session: Not on file  Stress:   . Feeling of Stress : Not on file  Social Connections:   . Frequency of  Communication with Friends and Family: Not on file  . Frequency of Social Gatherings with Friends and Family: Not on file  . Attends Religious Services: Not on file  . Active Member of Clubs or Organizations: Not on file  . Attends Archivist Meetings: Not on file  . Marital Status: Not on file  Intimate Partner Violence:   . Fear of Current or Ex-Partner: Not on file  . Emotionally Abused: Not on file  . Physically Abused: Not on file  . Sexually Abused: Not on file   Health Maintenance  Topic Date Due  . Hepatitis C Screening  Never done  . COVID-19 Vaccine (1) Never done  . FOOT EXAM  06/08/2018  . INFLUENZA VACCINE  12/29/2019  . OPHTHALMOLOGY EXAM  01/08/2020  . HEMOGLOBIN A1C  02/02/2020  . MAMMOGRAM  02/14/2020  . URINE MICROALBUMIN  08/01/2020  . TETANUS/TDAP  10/27/2020  . DEXA SCAN  Completed  . PNA vac Low Risk Adult  Completed    The following portions of the patient's history were reviewed and updated as appropriate:  She  has a past medical history of Anemia, Arthritis, Asthma, Basal cell cancer, Chronic kidney disease (0867), Complication  of anesthesia, Diabetes mellitus (2010), DVT (deep venous thrombosis) ([place]) (2006), GERD (gastroesophageal reflux disease), Granulomatous lung disease ([place]) (2002), Hyperlipidemia, Hypertension (2004), Paralysis ([place]) (2006), and PTE (pulmonary thromboembolism) ([place]) (2006). She does not have any pertinent problems on file. She  has a past surgical history that includes Bunionectomy; Septoplasty; Tubal ligation; Colonoscopy (1992 & 2002); Rotator cuff repair (2009); epidural steroids (2006); Cervical fusion (2006); Tonsillectomy (79 years old); Total hip arthroplasty (06/01/2012); Anterior lumbar fusion (N/A, 08/12/2016); Anterior lat lumbar fusion (N/A, 08/12/2016); Lumbar percutaneous pedicle screw 4 level (N/A, 08/12/2016); Abdominal exposure (N/A, 08/12/2016); Total hip arthroplasty (Right, 02/23/2018); Cataract extraction (Right,  12/31/2018); and Cataract extraction (Left, 12/17/2018). Her family history includes COPD in her father; Cancer in her mother; Diabetes in her sister; Heart disease in her sister; Stroke in her maternal grandmother; Transient ischemic attack in her paternal aunt. She  reports that she has never smoked. She has never used smokeless tobacco. She reports current alcohol use of about 1.0 standard drink of alcohol per week. She reports that she does not use drugs. She has a current medication list which includes the following prescription(s): acetaminophen, albuterol, alendronate, aspirin, azelastine-fluticasone, calcium carbonate, calcium citrate-vitamin d, cholecalciferol, cyclobenzaprine, ezetimibe, ferrous sulfate, fluticasone-salmeterol, fluticasone-salmeterol, lidocaine, metformin, mirabegron er, multivitamin with minerals, multiple vitamins-minerals, mupirocin ointment, polyethyl glycol-propyl glycol, polyethylene glycol, pregabalin, tramadol, trolamine salicylate, and celecoxib. Current Outpatient Medications on File Prior to Visit  Medication Sig Dispense Refill  . acetaminophen (TYLENOL) 650 MG CR tablet Take 650-1,300 mg by mouth 2 (two) times daily as needed for pain.    [name] albuterol (VENTOLIN HFA) 108 (90 Base) MCG/ACT inhaler INHALE 2 PUFFS INTO THE LUNGS EVERY 6 HOURS AS NEEDED FOR WHEEZING OR SHORTNESS OF BREATH 25.5 g 0  . alendronate (FOSAMAX) 70 MG tablet Take 1 tablet (70 mg total) by mouth once a week. 12 tablet 3  . aspirin 81 MG chewable tablet Chew 1 tablet (81 mg total) by mouth 2 (two) times daily. (Patient taking differently: Chew 81 mg by mouth daily. ) 30 tablet 0  . Azelastine-Fluticasone (DYMISTA) 137-50 MCG/ACT SUSP 1-2 puffs each nostril once or twice daily 23 g 12  . Calcium Carbonate (CALCIUM 600 PO) Take 0.5 tablets by mouth daily.    . Calcium Citrate-Vitamin D (CALCIUM + D PO) Take 1 tablet by mouth daily.    . cholecalciferol (VITAMIN D) 1000 UNITS tablet Take 1,000  Units by mouth daily.     . cyclobenzaprine (FLEXERIL) 10 MG tablet TAKE 1 TABLET(10 MG) BY MOUTH THREE TIMES DAILY AS NEEDED FOR MUSCLE SPASMS 30 tablet 1  . Fluticasone-Salmeterol (ADVAIR DISKUS) 250-50 MCG/DOSE AEPB Inhale 1 puff into the lungs 2 (two) times daily. USE 1 INHALATION TWO TIMES  DAILY ; RINSE MOUTH 60 each 5  . Fluticasone-Salmeterol (ADVAIR DISKUS) 250-50 MCG/DOSE AEPB Inhale 1 puff into the lungs 2 (two) times daily. Rinse mouth 180 each 4  . Lidocaine (ASPERCREME LIDOCAINE) 4 % PTCH Apply 1 patch topically as needed (back pain).    . mirabegron ER (MYRBETRIQ) 50 MG TB24 tablet TAKE 1 TABLET(50 MG) BY MOUTH DAILY 30 tablet 5  . Multiple Vitamin (MULTIVITAMIN WITH MINERALS) TABS tablet Take 1 tablet by mouth daily.    . Multiple Vitamins-Minerals (OCUVITE EXTRA PO) Take 1 tablet by mouth daily.    . mupirocin ointment (BACTROBAN) 2 % Apply 1 application topically daily as needed (hip immflamation).     Vladimir Faster Glycol-Propyl Glycol (SYSTANE OP) Place 1 drop into both eyes daily  as needed (dry eyes).    . polyethylene glycol (MIRALAX / GLYCOLAX) packet Take 17 g by mouth daily as needed for mild constipation.     . pregabalin (LYRICA) 100 MG capsule TAKE 1 CAPSULE(100 MG) BY MOUTH THREE TIMES DAILY 270 capsule 1  . traMADol (ULTRAM) 50 MG tablet Take 1 tablet (50 mg total) by mouth every 6 (six) hours as needed. 60 tablet 1  . Trolamine Salicylate (ASPERCREME EX) Apply 1 application topically daily as needed (back pain).    . celecoxib (CELEBREX) 200 MG capsule TAKE 1 CAPSULE(200 MG) BY MOUTH DAILY (Patient not taking: Reported on 02/07/2020) 90 capsule 1   No current facility-administered medications on file prior to visit.   She is allergic to statins..  Review of Systems  Review of Systems  Constitutional: Negative for activity change, appetite change and fatigue.  HENT: Negative for hearing loss, congestion, tinnitus and ear discharge.   Eyes: Negative for visual  disturbance (see optho q1y -- vision corrected to 20/20 with glasses).  Respiratory: Negative for cough, chest tightness and shortness of breath.   Cardiovascular: Negative for chest pain, palpitations and leg swelling.  Gastrointestinal: Negative for abdominal pain, diarrhea, constipation and abdominal distention.  Genitourinary: Negative for urgency, frequency, decreased urine volume and difficulty urinating.  Musculoskeletal: Negative for back pain, arthralgias and gait problem.  Skin: Negative for color change, pallor and rash.  Neurological: Negative for dizziness, light-headedness, numbness and headaches.  Hematological: Negative for adenopathy. Does not bruise/bleed easily.  Psychiatric/Behavioral: Negative for suicidal ideas, confusion, sleep disturbance, self-injury, dysphoric mood, decreased concentration and agitation.  Pt is able to read and write and can do all ADLs No risk for falling No abuse/ violence in home     Objective:    BP 130/72 (BP Location: Right Arm, Patient Position: Sitting, Cuff Size: Large)   Pulse 67   Temp (!) 97.1 F (36.2 C) (Oral)   Resp 18   Ht 5\' 4"  (1.626 m)   Wt 162 lb 6.4 oz (73.7 kg)   SpO2 99%   BMI 27.88 kg/m  General appearance: alert, cooperative, appears stated age and no distress Head: Normocephalic, without obvious abnormality, atraumatic Eyes: negative findings: lids and lashes normal and conjunctivae and sclerae normal Ears: normal TM's and external ear canals both ears Neck: no adenopathy, no carotid bruit, no JVD, supple, symmetrical, trachea midline and thyroid not enlarged, symmetric, no tenderness/mass/nodules Back: symmetric, no curvature. ROM normal. No CVA tenderness. Lungs: clear to auscultation bilaterally Breasts: deferred --- pt preferred  Heart: regular rate and rhythm, S1, S2 normal, no murmur, click, rub or gallop Abdomen: soft, non-tender; bowel sounds normal; no masses,  no organomegaly Pelvic: not indicated;  post-menopausal, no abnormal Pap smears in past Extremities: extremities normal, atraumatic, no cyanosis or edema Pulses: 2+ and symmetric Skin: Skin color, texture, turgor normal. No rashes or lesions Lymph nodes: Cervical, supraclavicular, and axillary nodes normal. Neurologic: Alert and oriented X 3, normal strength and tone. Normal symmetric reflexes. Normal coordination and gait   Encouraged heart healthy diet, increase exercise, avoid trans fats, consider a krill oil cap daily Assessment:    Healthy female exam.      Plan:    ghm utd Check labs  See After Visit Summary for Counseling Recommendations    1. Hyperlipidemia associated with type 2 diabetes mellitus ([place]) Encouraged heart healthy diet, increase exercise, avoid trans fats, consider a krill oil cap daily - ezetimibe (ZETIA) 10 MG tablet; Take 1 tablet (10  mg total) by mouth daily.  Dispense: 90 tablet; Refill: 1  2. Iron deficiency anemia, unspecified iron deficiency anemia type Check labs today - ferrous sulfate (FEROSUL) 325 (65 FE) MG tablet; TAKE 1 TABLET(325 MG) BY MOUTH DAILY WITH BREAKFAST  Dispense: 90 tablet; Refill: 1  3. Uncontrolled type 2 diabetes mellitus with hyperglycemia ([place]) hgba1c to be checked , minimize simple carbs. Increase exercise as tolerated. Continue current meds  - metFORMIN (GLUCOPHAGE-XR) 500 MG 24 hr tablet; Take 2 tablets by mouth daily  Dispense: 180 tablet; Refill: 1 - Hemoglobin A1c - Microalbumin / creatinine urine ratio  4. Preventative health care See above  5. Osteopenia, unspecified location   - DG Bone Density; Future  6. Need for hepatitis C screening test   - Hepatitis C antibody  7. Need for pneumococcal vaccination   - Pneumococcal polysaccharide vaccine 23-valent greater than or equal to 2yo subcutaneous/IM  8. Immunization due

## 2020-02-08 LAB — MICROALBUMIN / CREATININE URINE RATIO
Creatinine, Urine: 75 mg/dL (ref 20–275)
Microalb Creat Ratio: 4 mcg/mg creat (ref <30)
Microalb, Ur: 0.3 mg/dL

## 2020-02-10 LAB — HEMOGLOBIN A1C
Hgb A1c MFr Bld: 7 % of total Hgb — ABNORMAL HIGH (ref <5.7)
Mean Plasma Glucose: 154 (calc)
eAG (mmol/L): 8.5 (calc)

## 2020-02-10 LAB — HEPATITIS C ANTIBODY
Hepatitis C Ab: NONREACTIVE
SIGNAL TO CUT-OFF: 0.01 (ref <1.00)

## 2020-02-11 ENCOUNTER — Telehealth: Payer: Self-pay

## 2020-02-11 NOTE — Telephone Encounter (Signed)
Pt was in for her CPE last week and called office back with the dates she received the Moderna vaccines so her chart can be updated: 1st vaccine 06/11/19, 2nd vaccine 07/09/19.

## 2020-02-11 NOTE — Telephone Encounter (Signed)
Noted  

## 2020-02-13 ENCOUNTER — Telehealth: Payer: Self-pay | Admitting: Family Medicine

## 2020-02-13 NOTE — Progress Notes (Signed)
  Chronic Care Management   Note  02/13/2020 Name: [name] MRN: [id number] DOB: 1940-07-28  [name] is a 79 y.o. year old female who is a primary care patient of [name], DO. I reached out to Joesph Fillers by phone today in response to a referral sent by Ms. Delena Serve PCP, [name], DO.   [name] was given information about Chronic Care Management services today including:  1. CCM service includes personalized support from designated clinical staff supervised by her physician, including individualized plan of care and coordination with other care providers 2. 24/7 contact phone numbers for assistance for urgent and routine care needs. 3. Service will only be billed when office clinical staff spend 20 minutes or more in a month to coordinate care. 4. Only one practitioner may furnish and bill the service in a calendar month. 5. The patient may stop CCM services at any time (effective at the end of the month) by phone call to the office staff.   Patient agreed to services and verbal consent obtained.   Follow up plan:   Carley Perdue [employer] Scheduler

## 2020-02-18 DIAGNOSIS — M5416 Radiculopathy, lumbar region: Secondary | ICD-10-CM | POA: Diagnosis not present

## 2020-02-18 DIAGNOSIS — R030 Elevated blood-pressure reading, without diagnosis of hypertension: Secondary | ICD-10-CM | POA: Diagnosis not present

## 2020-02-21 ENCOUNTER — Ambulatory Visit (HOSPITAL_BASED_OUTPATIENT_CLINIC_OR_DEPARTMENT_OTHER)
Admission: RE | Admit: 2020-02-21 | Discharge: 2020-02-21 | Disposition: A | Discharge status: Home / Self Care | Payer: Medicare Other | Source: Ambulatory Visit | Attending: Family Medicine | Admitting: Family Medicine

## 2020-02-21 ENCOUNTER — Other Ambulatory Visit: Payer: Self-pay

## 2020-02-21 DIAGNOSIS — E119 Type 2 diabetes mellitus without complications: Secondary | ICD-10-CM | POA: Insufficient documentation

## 2020-02-21 DIAGNOSIS — M85832 Other specified disorders of bone density and structure, left forearm: Secondary | ICD-10-CM | POA: Diagnosis not present

## 2020-02-21 DIAGNOSIS — M8580 Other specified disorders of bone density and structure, unspecified site: Secondary | ICD-10-CM

## 2020-02-21 DIAGNOSIS — Z1231 Encounter for screening mammogram for malignant neoplasm of breast: Secondary | ICD-10-CM | POA: Diagnosis not present

## 2020-02-21 DIAGNOSIS — Z13820 Encounter for screening for osteoporosis: Secondary | ICD-10-CM | POA: Diagnosis not present

## 2020-02-21 DIAGNOSIS — Z780 Asymptomatic menopausal state: Secondary | ICD-10-CM | POA: Insufficient documentation

## 2020-02-22 NOTE — Progress Notes (Signed)
LVM to call if needing to discuss results

## 2020-03-23 DIAGNOSIS — M5416 Radiculopathy, lumbar region: Secondary | ICD-10-CM | POA: Diagnosis not present

## 2020-04-03 ENCOUNTER — Telehealth: Payer: Self-pay | Admitting: Pharmacist

## 2020-04-03 NOTE — Progress Notes (Addendum)
Chronic Care Management Pharmacy Assistant   Name: MIAH BOYE  MRN: [id number] DOB: 1940-12-14  Reason for Encounter: Medication Review/ Initial Questions for Pharmacist visit on 04-06-20 at 11:00am.  Patient Questions:  1.  Have you seen any other providers since your last visit? No  2.  Any changes in your medicines or health? No  PCP : [name], DO  Allergies:   Allergies  Allergen Reactions  . Statins Other (See Comments)    Myalgias and muscle weakness    Medications: Outpatient Encounter Medications as of 04/03/2020  Medication Sig  . acetaminophen (TYLENOL) 650 MG CR tablet Take 650-1,300 mg by mouth 2 (two) times daily as needed for pain.  [name] albuterol (VENTOLIN HFA) 108 (90 Base) MCG/ACT inhaler INHALE 2 PUFFS INTO THE LUNGS EVERY 6 HOURS AS NEEDED FOR WHEEZING OR SHORTNESS OF BREATH  . alendronate (FOSAMAX) 70 MG tablet Take 1 tablet (70 mg total) by mouth once a week.  [name] aspirin 81 MG chewable tablet Chew 1 tablet (81 mg total) by mouth 2 (two) times daily. (Patient taking differently: Chew 81 mg by mouth daily. )  . Azelastine-Fluticasone (DYMISTA) 137-50 MCG/ACT SUSP 1-2 puffs each nostril once or twice daily  . Calcium Carbonate (CALCIUM 600 PO) Take 0.5 tablets by mouth daily.  . Calcium Citrate-Vitamin D (CALCIUM + D PO) Take 1 tablet by mouth daily.  . celecoxib (CELEBREX) 200 MG capsule TAKE 1 CAPSULE(200 MG) BY MOUTH DAILY (Patient not taking: Reported on 02/07/2020)  . cholecalciferol (VITAMIN D) 1000 UNITS tablet Take 1,000 Units by mouth daily.   . cyclobenzaprine (FLEXERIL) 10 MG tablet TAKE 1 TABLET(10 MG) BY MOUTH THREE TIMES DAILY AS NEEDED FOR MUSCLE SPASMS  . ezetimibe (ZETIA) 10 MG tablet Take 1 tablet (10 mg total) by mouth daily.  . ferrous sulfate (FEROSUL) 325 (65 FE) MG tablet TAKE 1 TABLET(325 MG) BY MOUTH DAILY WITH BREAKFAST  . Fluticasone-Salmeterol (ADVAIR DISKUS) 250-50 MCG/DOSE AEPB Inhale 1 puff into the lungs 2 (two) times  daily. USE 1 INHALATION TWO TIMES  DAILY ; RINSE MOUTH  . Fluticasone-Salmeterol (ADVAIR DISKUS) 250-50 MCG/DOSE AEPB Inhale 1 puff into the lungs 2 (two) times daily. Rinse mouth  . Lidocaine (ASPERCREME LIDOCAINE) 4 % PTCH Apply 1 patch topically as needed (back pain).  . metFORMIN (GLUCOPHAGE-XR) 500 MG 24 hr tablet Take 2 tablets by mouth daily  . mirabegron ER (MYRBETRIQ) 50 MG TB24 tablet TAKE 1 TABLET(50 MG) BY MOUTH DAILY  . Multiple Vitamin (MULTIVITAMIN WITH MINERALS) TABS tablet Take 1 tablet by mouth daily.  . Multiple Vitamins-Minerals (OCUVITE EXTRA PO) Take 1 tablet by mouth daily.  . mupirocin ointment (BACTROBAN) 2 % Apply 1 application topically daily as needed (hip immflamation).   Vladimir Faster Glycol-Propyl Glycol (SYSTANE OP) Place 1 drop into both eyes daily as needed (dry eyes).  . polyethylene glycol (MIRALAX / GLYCOLAX) packet Take 17 g by mouth daily as needed for mild constipation.   . pregabalin (LYRICA) 100 MG capsule TAKE 1 CAPSULE(100 MG) BY MOUTH THREE TIMES DAILY  . traMADol (ULTRAM) 50 MG tablet Take 1 tablet (50 mg total) by mouth every 6 (six) hours as needed.  Loura Pardon Salicylate (ASPERCREME EX) Apply 1 application topically daily as needed (back pain).   No facility-administered encounter medications on file as of 04/03/2020.    Current Diagnosis: Patient Active Problem List   Diagnosis Date Noted  . Osteoarthritis 08/02/2019  . Uncontrolled type 2 diabetes mellitus with  hyperglycemia ([place]) 01/28/2019  . Essential hypertension 01/28/2019  . Hyperlipidemia associated with type 2 diabetes mellitus ([place]) 01/28/2019  . Breast pain, left 01/28/2019  . CAP (community acquired pneumonia) 07/06/2018  . Pleuritic chest pain 07/06/2018  . Pleural effusion   . Status post total replacement of right hip 02/23/2018  . Cellulitis 12/28/2017  . Unilateral primary osteoarthritis, right hip 12/14/2017  . Type 2 diabetes mellitus with diabetic neuropathy ([place])  06/08/2017  . Neuropathic pain 03/30/2017  . Seasonal allergies 03/30/2017  . Lumbar scoliosis 08/12/2016  . Controlled diabetes mellitus type 2 with complications ([place]) 59/56/3875  . Scoliosis 11/24/2015  . Low back pain 09/29/2014  . Diabetes mellitus with peripheral vascular disease ([place]) 04/10/2013  . Left carotid bruit 04/10/2013  . Anemia 03/31/2013  . Degenerative arthritis of hip 06/01/2012  . POSTMENOPAUSAL SYNDROME 09/16/2009  . ARTHRALGIA 09/16/2009  . Seasonal and perennial allergic rhinitis 10/08/2007  . Osteopenia 08/06/2007  . ELEVATED BLOOD PRESSURE WITHOUT DIAGNOSIS OF HYPERTENSION 08/06/2007  . HYPERLIPIDEMIA 05/14/2007  . Asthma, mild intermittent 05/14/2007  . History of pulmonary embolus (PE) 08/31/2006    Goals Addressed   None    Have you seen any other providers since your last visit? No Any changes in your medications or health? No Any side effects from any medications? No Do you have an symptoms or problems not managed by your medications? No Any concerns about your health right now? Yes Has your provider asked that you check blood pressure, blood sugar, or follow special diet at home? Yes she checks her sugar levels daily.  Reports sugar levels are a little high lately. Do you get any type of exercise on a regular basis? Reports she is not exercising like she use to at the [facility]. Can you think of a goal you would like to reach for your health? States she would like to lose weight. Do you have any problems getting your medications? No Is there anything that you would like to discuss during the appointment? Losing weight and medication questions.  Please bring medications and supplements to appointment   Follow-Up:  Pharmacist Review   [place] Shannon, [facility] Pharmacist Assistant [phone]  Reviewed by: De Blanch, PharmD Clinical Pharmacist [facility] [phone]

## 2020-04-05 NOTE — Chronic Care Management (AMB) (Signed)
Chronic Care Management Pharmacy  Name: MARIEANNE RUEHLE  MRN: [id number] DOB: 1940-12-15   Chief Complaint/ HPI  [name],  79 y.o. , female presents for their Initial CCM visit with the clinical pharmacist In office.  PCP : Donato Schultz, DO  Their chronic conditions include: Hypertension, Hyperlipidemia, Diabetes, Asthma, Osteopenia, Overactive Bladder, Neuropathy, Anemia  Office Visits: 02/07/20: Visit w/ Dr. Laury Axon - No med changes noted.  Consult Visit: 01/03/20: Ophthalmology visit w/ Dr. Arnetha Gula - No diabetic retinopathy noted.   Medications: Outpatient Encounter Medications as of 04/06/2020  Medication Sig  . acetaminophen (TYLENOL) 650 MG CR tablet Take 650-1,300 mg by mouth 2 (two) times daily as needed for pain.  [name] albuterol (VENTOLIN HFA) 108 (90 Base) MCG/ACT inhaler INHALE 2 PUFFS INTO THE LUNGS EVERY 6 HOURS AS NEEDED FOR WHEEZING OR SHORTNESS OF BREATH  . alendronate (FOSAMAX) 70 MG tablet Take 1 tablet (70 mg total) by mouth once a week.  [name] aspirin 81 MG chewable tablet Chew 1 tablet (81 mg total) by mouth 2 (two) times daily. (Patient taking differently: Chew 81 mg by mouth daily. )  . Azelastine-Fluticasone (DYMISTA) 137-50 MCG/ACT SUSP 1-2 puffs each nostril once or twice daily  . Calcium Carbonate (CALCIUM 600 PO) Take 0.5 tablets by mouth daily.  . Calcium Citrate-Vitamin D (CALCIUM + D PO) Take 1 tablet by mouth daily.  . celecoxib (CELEBREX) 200 MG capsule TAKE 1 CAPSULE(200 MG) BY MOUTH DAILY (Patient not taking: Reported on 02/07/2020)  . cholecalciferol (VITAMIN D) 1000 UNITS tablet Take 1,000 Units by mouth daily.   . cyclobenzaprine (FLEXERIL) 10 MG tablet TAKE 1 TABLET(10 MG) BY MOUTH THREE TIMES DAILY AS NEEDED FOR MUSCLE SPASMS  . ezetimibe (ZETIA) 10 MG tablet Take 1 tablet (10 mg total) by mouth daily.  . ferrous sulfate (FEROSUL) 325 (65 FE) MG tablet TAKE 1 TABLET(325 MG) BY MOUTH DAILY WITH BREAKFAST  . Fluticasone-Salmeterol (ADVAIR  DISKUS) 250-50 MCG/DOSE AEPB Inhale 1 puff into the lungs 2 (two) times daily. USE 1 INHALATION TWO TIMES  DAILY ; RINSE MOUTH  . Fluticasone-Salmeterol (ADVAIR DISKUS) 250-50 MCG/DOSE AEPB Inhale 1 puff into the lungs 2 (two) times daily. Rinse mouth  . Lidocaine (ASPERCREME LIDOCAINE) 4 % PTCH Apply 1 patch topically as needed (back pain).  . metFORMIN (GLUCOPHAGE-XR) 500 MG 24 hr tablet Take 2 tablets by mouth daily  . mirabegron ER (MYRBETRIQ) 50 MG TB24 tablet TAKE 1 TABLET(50 MG) BY MOUTH DAILY  . Multiple Vitamin (MULTIVITAMIN WITH MINERALS) TABS tablet Take 1 tablet by mouth daily.  . Multiple Vitamins-Minerals (OCUVITE EXTRA PO) Take 1 tablet by mouth daily.  . mupirocin ointment (BACTROBAN) 2 % Apply 1 application topically daily as needed (hip immflamation).   Bertram Gala Glycol-Propyl Glycol (SYSTANE OP) Place 1 drop into both eyes daily as needed (dry eyes).  . polyethylene glycol (MIRALAX / GLYCOLAX) packet Take 17 g by mouth daily as needed for mild constipation.   . pregabalin (LYRICA) 100 MG capsule TAKE 1 CAPSULE(100 MG) BY MOUTH THREE TIMES DAILY  . traMADol (ULTRAM) 50 MG tablet Take 1 tablet (50 mg total) by mouth every 6 (six) hours as needed.  Terrance Mass Salicylate (ASPERCREME EX) Apply 1 application topically daily as needed (back pain).   No facility-administered encounter medications on file as of 04/06/2020.   SDOH Screenings   Alcohol Screen:   . Last Alcohol Screening Score (AUDIT): Not on file  Depression (PHQ2-9): Low Risk   .  PHQ-2 Score: 0  Financial Resource Strain:   . Difficulty of Paying Living Expenses: Not on file  Food Insecurity:   . Worried About Programme researcher, broadcasting/film/video in the Last Year: Not on file  . Ran Out of Food in the Last Year: Not on file  Housing:   . Last Housing Risk Score: Not on file  Physical Activity:   . Days of Exercise per Week: Not on file  . Minutes of Exercise per Session: Not on file  Social Connections: Moderately  Integrated  . Frequency of Communication with Friends and Family: Three times a week  . Frequency of Social Gatherings with Friends and Family: Once a week  . Attends Religious Services: More than 4 times per year  . Active Member of Clubs or Organizations: Yes  . Attends Banker Meetings: More than 4 times per year  . Marital Status: Divorced  Stress:   . Feeling of Stress : Not on file  Tobacco Use: Low Risk   . Smoking Tobacco Use: Never Smoker  . Smokeless Tobacco Use: Never Used  Transportation Needs:   . Freight forwarder (Medical): Not on file  . Lack of Transportation (Non-Medical): Not on file     Current Diagnosis/Assessment:  Goals Addressed   None    Lives at [facility].  The food is really good and she likes the people.  Has a son and daughter.  Very close to her oldest grand daughter. She kept her for 7 weeks when she was a year and a half.  Has 5 grandchildren.  Oldest grandson was in the [employer]. Now his doing EMT.   Plays cards with a group of people. Is part of bible study group. It is "in depth". 3 pages of notes for each study sessionl  Bottom of Spine and left hip replaced in 2018.  Hypertension   BP goal is:  <130/80  Office blood pressures are  BP Readings from Last 3 Encounters:  02/07/20 130/72  08/02/19 120/62  01/28/19 (!) 122/53   Patient checks BP at home Never Patient home BP readings are ranging: Unable to assess  Patient has failed these meds in the past: hctz (listed in D/C meds. No apparent reason for D/C)  Patient is currently controlled on the following medications:  . None   Plan -Continue control with diet and exercise   Hyperlipidemia   LDL goal < 100  Last lipids Lab Results  Component Value Date   CHOL 177 08/02/2019   HDL 45.30 08/02/2019   LDLCALC 99 06/14/2018   LDLDIRECT 66.0 08/02/2019   TRIG (H) 08/02/2019    421.0 Triglyceride is over 400; calculations on Lipids are  invalid.   CHOLHDL 4 08/02/2019   Hepatic Function Latest Ref Rng & Units 08/02/2019 01/28/2019 06/14/2018  Total Protein 6.0 - 8.3 g/dL 7.0 6.9 6.8  Albumin 3.5 - 5.2 g/dL 4.0 3.9 3.9  AST 0 - 37 U/L 20 19 16   ALT 0 - 35 U/L 23 18 15   Alk Phosphatase 39 - 117 U/L 68 80 85  Total Bilirubin 0.2 - 1.2 mg/dL 0.4 0.4 0.4  Bilirubin, Direct 0.0 - 0.3 mg/dL - - -     The 09-WJXB ASCVD risk score Denman George DC Jr., et al., 2013) is: 41.5%   Values used to calculate the score:     Age: 64 years     Sex: Female     Is Non-Hispanic African American: No  Diabetic: Yes     Tobacco smoker: No     Systolic Blood Pressure: 130 mmHg     Is BP treated: No     HDL Cholesterol: 45.3 mg/dL     Total Cholesterol: 177 mg/dL   Patient has failed these meds in past: statins (myalgias and muscle weakness) Patient is currently controlled, except for TRIG on the following medications:  . Ezetimibe 10mg  daily AM  Diet Eats a lot of fish Loves sweets B - Oatmeal packet (maple); adds butter and cinnamon L - Usually skips D - Fish, ocassionally steaks (about every 2 weeks), with salad Snacks - Loves fruit (pears, apples, bananas) Drinks - Diet coke, water (tries to drink 3 water bottles per Starlynn Klinkner) gets some with electrolytes  Exercise Not exercising as much as she used to She plans to start back when she isn't busy.  We discussed:  LDL goal  Plan -Continue current medications  Diabetes   A1c goal <7%  Recent Relevant Labs: Lab Results  Component Value Date/Time   HGBA1C 7.0 (H) 02/07/2020 02:49 PM   HGBA1C 6.9 (H) 08/02/2019 11:13 AM   GFR 60.48 08/02/2019 11:13 AM   GFR 56.21 (L) 01/28/2019 02:25 PM   MICROALBUR 0.3 02/07/2020 02:49 PM   MICROALBUR <0.7 08/02/2019 11:13 AM    Last diabetic Eye exam:  01/03/20 w/ Dr. Arnetha Gula Lab Results  Component Value Date/Time   HMDIABEYEEXA No Retinopathy 11/21/2016 12:00 AM    Last diabetic Foot exam: No results found for: HMDIABFOOTEX   Checking  BG: Weekly  Recent FBG Readings: 122 > 300 (steroid injection) >169 > 140s  Patient has failed these meds in past: None noted  Patient is currently uncontrolled on the following medications: [name] Metformin 500mg  #2 tabs daily  Did you ever take higher dose of metformin? No, she was taking 1 tab and would like to move back to 1 tab  "I like sweets"  She used to go to an exercise class, but states "life got too busy"   We discussed: DM goals  Plan -Restart exercise classes -Continue current medications  Asthma / Tobacco   Last spirometry score: None noted   Eosinophil count:   Lab Results  Component Value Date/Time   EOSPCT 1.0 01/28/2019 02:25 PM  %                               Eos (Absolute):  Lab Results  Component Value Date/Time   EOSABS 0.1 01/28/2019 02:25 PM    Tobacco Status:  Social History   Tobacco Use  Smoking Status Never Smoker  Smokeless Tobacco Never Used    Patient has failed these meds in past: None noted  Patient is currently controlled on the following medications:  Ventolin HFA 2 puffs every 6 hours as needed  Advair 250/70mcg 1 puff twice daily Using maintenance inhaler regularly? Yes Frequency of rescue inhaler use:  infrequently (only needs about one rescue inhaler per year)  States the generic Advair does not work as well for her.  Sees Dr. Jetty Duhamel yearly.   Plan -Continue current medications   Osteopenia    Last DEXA Scan: 02/21/20  T-Score forearm radius: -2.1  Vit D, 25-Hydroxy  Date Value Ref Range Status  11/08/2010 55 30 - 89 ng/mL Final    Comment:    This assay accurately quantifies Vitamin D, which is the sum of the 25-Hydroxy forms of Vitamin D2 and  D3.  Studies have shown that the optimum concentration of 25-Hydroxy Vitamin D is 30 ng/mL or higher.  Concentrations of Vitamin D between 20 and 29 ng/mL are considered to be insufficient and concentrations less than 20 ng/mL are considered to be deficient for  Vitamin D.     Patient has failed these meds in past: None noted  Patient is currently controlled on the following medications:  . Alendronate 70mg  weekly Saturdays . Calcium 600mg  1/2 tab daily . Calcium/Vitamin D  Vitamin D 1000 units daily  Confirm supplements and total daily amounts.   We discussed:  Recommend [phone] units of vitamin D daily. Recommend 1200 mg of calcium daily from dietary and supplemental sources.  Plan -Continue current medications (assuring pt is not over supplementing)  Overactive Bladder    Patient has failed these meds in past: None noted  Patient is currently controlled on the following medications: [name] Myrbetriq 50mg  daily  Takes as needed  Plan -Continue current medications   Neuropathy    Patient has failed these meds in past: None noted  Patient is currently controlled on the following medications: . Lyrica 100mg  three times daily AM, noon, dinner  Plan -Continue current medications   Anemia    CBC Latest Ref Rng & Units 01/28/2019 07/19/2018 07/07/2018  WBC 4.0 - 10.5 K/uL 8.6 7.2 -  Hemoglobin 12.0 - 15.0 g/dL 09.8 11.9 10.4(L)  Hematocrit 36 - 46 % 37.5 38.9 34.2(L)  Platelets 150 - 400 K/uL 218.0 302.0 -    Patient has failed these meds in past: None noted  Patient is currently controlled on the following medications: . Ferrous Sulfate 325mg  daily AM   Plan -Continue current medications   Vaccines   Reviewed and discussed patient's vaccination history.    Immunization History  Administered Date(s) Administered  . Fluad Quad(high Dose 65+) 01/28/2019  . Influenza Inj Mdck Quad With Preservative 03/23/2018  . Influenza Split 03/15/2012  . Influenza Whole 02/27/2009  . Influenza, High Dose Seasonal PF 03/19/2013, 03/21/2016  . Influenza,inj,Quad PF,6+ Mos 03/13/2014, 02/16/2015  . Influenza-Unspecified 03/15/2012, 01/28/2014, 03/13/2014, 02/16/2015, 02/14/2017  . Moderna SARS-COVID-2 Vaccination 06/11/2019, 07/09/2019   . Pneumococcal Conjugate-13 05/16/2013  . Pneumococcal Polysaccharide-23 09/29/2014, 02/07/2020  . Tdap 10/28/2010  . Zoster 05/12/2014  . Zoster Recombinat (Shingrix) 02/11/2019, 04/12/2019   Received flu vaccine 03/31/2020 Receiving covid booster on 04/14/20  Plan -Update vaccine record  Medication Management   Pt uses [facility] pharmacy for all medications Uses pill box? Yes Pt endorses missing her medication once in a while (1-2 days a month)  Meds to D/C from list Symbicort (duplicate)  Miscellaneous Meds Acetaminophen 650mg   Aspirin 81mg   Dymista Celebrex 200mg   Tramadol 50mg   Cyclobenzaprine 10mg  TID prn Aspercreme Multivitamin  Mupirocin (would like refill) Systane Miralax   We discussed: Discussed benefits of medication synchronization, packaging and delivery as well as enhanced pharmacist oversight with [facility]. Patient would like to continue managing her own medications.  Plan -Continue current medication management strategy    Follow up:  3 month phone visit  Katrinka Blazing, PharmD Clinical Pharmacist [facility] [phone]

## 2020-04-06 ENCOUNTER — Other Ambulatory Visit: Payer: Self-pay

## 2020-04-06 ENCOUNTER — Ambulatory Visit: Payer: Medicare Other | Admitting: Pharmacist

## 2020-04-06 VITALS — BP 141/59 | HR 63 | Temp 98.2°F | Wt 165.8 lb

## 2020-04-06 DIAGNOSIS — E782 Mixed hyperlipidemia: Secondary | ICD-10-CM

## 2020-04-06 DIAGNOSIS — I10 Essential (primary) hypertension: Secondary | ICD-10-CM

## 2020-04-06 DIAGNOSIS — E1165 Type 2 diabetes mellitus with hyperglycemia: Secondary | ICD-10-CM

## 2020-04-06 DIAGNOSIS — E1140 Type 2 diabetes mellitus with diabetic neuropathy, unspecified: Secondary | ICD-10-CM

## 2020-04-06 DIAGNOSIS — E1169 Type 2 diabetes mellitus with other specified complication: Secondary | ICD-10-CM

## 2020-04-13 DIAGNOSIS — M5416 Radiculopathy, lumbar region: Secondary | ICD-10-CM | POA: Diagnosis not present

## 2020-04-13 DIAGNOSIS — M48062 Spinal stenosis, lumbar region with neurogenic claudication: Secondary | ICD-10-CM | POA: Diagnosis not present

## 2020-04-19 NOTE — Patient Instructions (Addendum)
Visit Information  Goals Addressed            This Visit's Progress   . Chronic Care Management Pharmacy Care Plan       CARE PLAN ENTRY (see longitudinal plan of care for additional care plan information)  Current Barriers:  . Chronic Disease Management support, education, and care coordination needs related to  Hypertension, Hyperlipidemia, Diabetes, Asthma, Osteopenia, Overactive Bladder, Neuropathy, Anemia   Hypertension BP Readings from Last 3 Encounters:  04/06/20 (!) 141/59  02/07/20 130/72  08/02/19 120/62   . Pharmacist Clinical Goal(s): o Over the next 90 days, patient will work with PharmD and providers to maintain BP goal <130/80 . Current regimen:  o Diet and exercise management   . Patient self care activities - Over the next 90 days, patient will: o Maintain blood pressure less than 130/80  Hyperlipidemia Lab Results  Component Value Date/Time   LDLCALC 99 06/14/2018 10:05 AM   LDLDIRECT 66.0 08/02/2019 11:13 AM   . Pharmacist Clinical Goal(s): o Over the next 90 days, patient will work with PharmD and providers to maintain LDL goal < 100 . Current regimen:  o Ezetimibe 10mg  daily AM . Interventions: o Discussed LDL goal . Patient self care activities - Over the next 90 days, patient will: o Maintain cholesterol medication regimen.   Diabetes Lab Results  Component Value Date/Time   HGBA1C 7.0 (H) 02/07/2020 02:49 PM   HGBA1C 6.9 (H) 08/02/2019 11:13 AM   . Pharmacist Clinical Goal(s): o Over the next 90 days, patient will work with PharmD and providers to maintain A1c goal <7% . Current regimen:  o Metformin 500mg  #2 tabs daily . Interventions: o Recommended patient to restart her exercise classes o Discussed DM goals . Patient self care activities - Over the next 90 days, patient will: o Maintain DM regimen  Medication management . Pharmacist Clinical Goal(s): o Over the next 90 days, patient will work with PharmD and providers to maintain  optimal medication adherence . Current pharmacy: [facility] . Interventions o Comprehensive medication review performed. o Continue current medication management strategy . Patient self care activities - Over the next 90 days, patient will: o Focus on medication adherence by filling and taking medications appropriately  o Take medications as prescribed o Report any questions or concerns to PharmD and/or provider(s)  Initial goal documentation        Ms. Gorniak was given information about Chronic Care Management services today including:  1. CCM service includes personalized support from designated clinical staff supervised by her physician, including individualized plan of care and coordination with other care providers 2. 24/7 contact phone numbers for assistance for urgent and routine care needs. 3. Standard insurance, coinsurance, copays and deductibles apply for chronic care management only during months in which we provide at least 20 minutes of these services. Most insurances cover these services at 100%, however patients may be responsible for any copay, coinsurance and/or deductible if applicable. This service may help you avoid the need for more expensive face-to-face services. 4. Only one practitioner may furnish and bill the service in a calendar month. 5. The patient may stop CCM services at any time (effective at the end of the month) by phone call to the office staff.  Patient agreed to services and verbal consent obtained.   The patient verbalized understanding of instructions, educational materials, and care plan provided today and agreed to receive a mailed copy of patient instructions, educational materials, and care plan.  Telephone  follow up appointment with pharmacy team member scheduled for: 07/07/2020  De Blanch, PharmD Clinical Pharmacist [facility] at [facility] [phone]

## 2020-04-20 ENCOUNTER — Ambulatory Visit: Payer: Self-pay | Admitting: *Deleted

## 2020-04-20 DIAGNOSIS — L565 Disseminated superficial actinic porokeratosis (DSAP): Secondary | ICD-10-CM | POA: Diagnosis not present

## 2020-04-20 DIAGNOSIS — L570 Actinic keratosis: Secondary | ICD-10-CM | POA: Diagnosis not present

## 2020-04-20 DIAGNOSIS — L821 Other seborrheic keratosis: Secondary | ICD-10-CM | POA: Diagnosis not present

## 2020-04-30 ENCOUNTER — Other Ambulatory Visit: Payer: Self-pay

## 2020-04-30 ENCOUNTER — Telehealth (INDEPENDENT_AMBULATORY_CARE_PROVIDER_SITE_OTHER): Payer: Medicare Other | Admitting: Family Medicine

## 2020-04-30 ENCOUNTER — Encounter: Payer: Self-pay | Admitting: Family Medicine

## 2020-04-30 DIAGNOSIS — J0100 Acute maxillary sinusitis, unspecified: Secondary | ICD-10-CM

## 2020-04-30 MED ORDER — PREDNISONE 20 MG PO TABS: 40.0000 mg | ORAL_TABLET | Freq: Every day | ORAL | 0 refills | Status: AC

## 2020-04-30 MED ORDER — AMOXICILLIN-POT CLAVULANATE 875-125 MG PO TABS: 1.0000 | ORAL_TABLET | Freq: Two times a day (BID) | ORAL | 0 refills | Status: AC

## 2020-04-30 NOTE — Progress Notes (Signed)
Chief Complaint  Patient presents with  . URI    sinus infection- discolored mucous   . Sinusitis    [name] here for URI complaints. Due to COVID-19 pandemic, we are interacting via web portal for an electronic face-to-face visit. I verified patient's ID using 2 identifiers. Patient agreed to proceed with visit via this method. Patient is at home, I am at office. Patient and I are present for visit.   Duration: 5 days  States these s/s's are like previous sinus infections she has had.  Associated symptoms: sinus congestion, sinus pain, rhinorrhea, itchy watery eyes, ear fullness and sore throat Denies: ear pain, ear drainage, wheezing, shortness of breath, myalgia and fevers, dental pain, N/V/D Treatment to date: Tylenol, ear drops, hot drinks Sick contacts: No  Past Medical History:  Diagnosis Date  . Anemia   . Arthritis   . Asthma    adult onset  . Basal cell cancer    LUE; Porokeratosis also  . Chronic kidney disease 1963   strep in kidney due to strep throat-hospitalized 10 days  . Complication of anesthesia    small trachea  . Diabetes mellitus 2010   A1c 6.7%  . DVT (deep venous thrombosis) ([place]) 2006   post immobilization post cns surgery  . GERD (gastroesophageal reflux disease)    very mild  . Granulomatous lung disease ([place]) 2002   incidental Xray finding  . Hyperlipidemia   . Hypertension 2004   Hypertensive response on Stress Test  . Paralysis ([place]) 2006   post cervical fusion with spinal sac tear  with hematoma   . PTE (pulmonary thromboembolism) ([place]) 2006   Exam No conversational dyspnea Age appropriate judgment and insight Nml affect and mood  Acute maxillary sinusitis, recurrence not specified - Plan: predniSONE (DELTASONE) 20 MG tablet, amoxicillin-clavulanate (AUGMENTIN) 875-125 MG tablet  Pocket rx with Augmentin. Pred burst for 5 d, 40 mg/d. Sounds like elevation change coming from mountains could have initiated this.  Continue to push  fluids, practice good hand hygiene, cover mouth when coughing. F/u prn. If starting to experience fevers, shaking, or shortness of breath, seek immediate care. Pt voiced understanding and agreement to the plan.  [place], DO 04/30/20 3:11 PM

## 2020-05-03 DIAGNOSIS — J45901 Unspecified asthma with (acute) exacerbation: Secondary | ICD-10-CM | POA: Diagnosis not present

## 2020-05-03 DIAGNOSIS — R053 Chronic cough: Secondary | ICD-10-CM | POA: Diagnosis not present

## 2020-05-06 ENCOUNTER — Other Ambulatory Visit: Payer: Self-pay | Admitting: Family Medicine

## 2020-05-06 DIAGNOSIS — E1140 Type 2 diabetes mellitus with diabetic neuropathy, unspecified: Secondary | ICD-10-CM

## 2020-05-06 NOTE — Telephone Encounter (Signed)
Requesting: Lyrica 100mg  Contract: 12/11/2017 UDS: 12/11/2017 Last Visit: 04/30/2020 w/ Dr. Nani Ravens Next Visit: 08/11/2020 Last Refill: 11/14/2019 #270 and 1RF Pt sig: 1 capsule tid  Please Advise

## 2020-05-15 ENCOUNTER — Telehealth: Payer: Self-pay | Admitting: Pharmacist

## 2020-05-15 NOTE — Progress Notes (Addendum)
Chronic Care Management Pharmacy Assistant   Name: [name]  MRN: [id number] DOB: 1941-01-27  Reason for Encounter: General Adherence Call  Patient Questions:  1.  Have you seen any other providers since your last visit? Yes  2.  Any changes in your medicines or health? Yes    PCP : [name], DO   Their chronic conditions include: Hypertension, Hyperlipidemia, Diabetes, Asthma, Osteopenia, Overactive Bladder, Neuropathy, Anemia.  Office Visits: 04-30-2020 (Fam Med) Patient presented via video with Dr. Nani Ravens c/o of upper respiratory sinus symptoms for five days. She reports having the same symptoms with previous sinus infections. Possible change in elevation from a trip to the mountains the patient to may have caused issues. Medication changes: Start Amoxicillin-Pot Clavulanate 875-125 mg tab; two tabs daily for seven days, Prednisone 40 mg tab daily.  Consults: None since their last CCM visit with the clinical pharmacist.  Allergies:   Allergies  Allergen Reactions   Statins Other (See Comments)    Myalgias and muscle weakness    Medications: Outpatient Encounter Medications as of 05/15/2020  Medication Sig Note   acetaminophen (TYLENOL) 650 MG CR tablet Take 650-1,300 mg by mouth 2 (two) times daily as needed for pain.    albuterol (VENTOLIN HFA) 108 (90 Base) MCG/ACT inhaler INHALE 2 PUFFS INTO THE LUNGS EVERY 6 HOURS AS NEEDED FOR WHEEZING OR SHORTNESS OF BREATH    alendronate (FOSAMAX) 70 MG tablet Take 1 tablet (70 mg total) by mouth once a week.    aspirin 81 MG chewable tablet Chew 1 tablet (81 mg total) by mouth 2 (two) times daily. (Patient taking differently: Chew 81 mg by mouth daily. )    Azelastine-Fluticasone (DYMISTA) 137-50 MCG/ACT SUSP 1-2 puffs each nostril once or twice daily    Calcium Citrate-Vitamin D (CALCIUM + D PO) Take 1 tablet by mouth daily.    celecoxib (CELEBREX) 200 MG capsule TAKE 1 CAPSULE(200 MG) BY MOUTH DAILY     Cholecalciferol (VITAMIN D) 50 MCG (2000 UT) CAPS Take 2,000 Units by mouth daily.     cyclobenzaprine (FLEXERIL) 10 MG tablet TAKE 1 TABLET(10 MG) BY MOUTH THREE TIMES DAILY AS NEEDED FOR MUSCLE SPASMS    ezetimibe (ZETIA) 10 MG tablet Take 1 tablet (10 mg total) by mouth daily.    ferrous sulfate (FEROSUL) 325 (65 FE) MG tablet TAKE 1 TABLET(325 MG) BY MOUTH DAILY WITH BREAKFAST    Fluticasone-Salmeterol (ADVAIR DISKUS) 250-50 MCG/DOSE AEPB Inhale 1 puff into the lungs 2 (two) times daily. USE 1 INHALATION TWO TIMES  DAILY ; RINSE MOUTH    Fluticasone-Salmeterol (ADVAIR DISKUS) 250-50 MCG/DOSE AEPB Inhale 1 puff into the lungs 2 (two) times daily. Rinse mouth    Lidocaine (ASPERCREME LIDOCAINE) 4 % PTCH Apply 1 patch topically as needed (back pain).    metFORMIN (GLUCOPHAGE-XR) 500 MG 24 hr tablet Take 2 tablets by mouth daily    mirabegron ER (MYRBETRIQ) 50 MG TB24 tablet TAKE 1 TABLET(50 MG) BY MOUTH DAILY 04/06/2020: Uses as needed   Multiple Vitamin (MULTIVITAMIN WITH MINERALS) TABS tablet Take 1 tablet by mouth daily.    Multiple Vitamins-Minerals (OCUVITE EXTRA PO) Take 1 tablet by mouth daily.    mupirocin ointment (BACTROBAN) 2 % Apply 1 application topically daily as needed (hip immflamation).     Polyethyl Glycol-Propyl Glycol (SYSTANE OP) Place 1 drop into both eyes daily as needed (dry eyes).    polyethylene glycol (MIRALAX / GLYCOLAX) packet Take 17 g by mouth  daily as needed for mild constipation.     pregabalin (LYRICA) 100 MG capsule TAKE 1 CAPSULE(100 MG) BY MOUTH THREE TIMES DAILY    traMADol (ULTRAM) 50 MG tablet Take 1 tablet (50 mg total) by mouth every 6 (six) hours as needed.    Trolamine Salicylate (ASPERCREME EX) Apply 1 application topically daily as needed (back pain).    No facility-administered encounter medications on file as of 05/15/2020.    Current Diagnosis: Patient Active Problem List   Diagnosis Date Noted   Osteoarthritis 08/02/2019   Uncontrolled type 2  diabetes mellitus with hyperglycemia ([place]) 01/28/2019   Essential hypertension 01/28/2019   Hyperlipidemia associated with type 2 diabetes mellitus ([place]) 01/28/2019   Breast pain, left 01/28/2019   CAP (community acquired pneumonia) 07/06/2018   Pleuritic chest pain 07/06/2018   Pleural effusion    Status post total replacement of right hip 02/23/2018   Cellulitis 12/28/2017   Unilateral primary osteoarthritis, right hip 12/14/2017   Type 2 diabetes mellitus with diabetic neuropathy ([place]) 06/08/2017   Neuropathic pain 03/30/2017   Seasonal allergies 03/30/2017   Lumbar scoliosis 08/12/2016   Controlled diabetes mellitus type 2 with complications ([place]) 83/15/1761   Scoliosis 11/24/2015   Low back pain 09/29/2014   Diabetes mellitus with peripheral vascular disease ([place]) 04/10/2013   Left carotid bruit 04/10/2013   Anemia 03/31/2013   Degenerative arthritis of hip 06/01/2012   POSTMENOPAUSAL SYNDROME 09/16/2009   ARTHRALGIA 09/16/2009   Seasonal and perennial allergic rhinitis 10/08/2007   Osteopenia 08/06/2007   ELEVATED BLOOD PRESSURE WITHOUT DIAGNOSIS OF HYPERTENSION 08/06/2007   HYPERLIPIDEMIA 05/14/2007   Asthma, mild intermittent 05/14/2007   History of pulmonary embolus (PE) 08/31/2006    Goals Addressed   None    Made outbound call to the patient for a General Adherence touch point. Patient expressed that she was doing well. She finished all her antibiotics from a sinus infection as well as prescribed steroids.  Inquired if she started exercising. She stated since the holidays she has not been able to do any scheduled classes offered through her apartment complex, but does walk during the week. She had also increased her water intake. Ms. Jahne shared with me that she is going to the mountains for [place] to celebrate with friends and family. She does not have any urgent issues or concerns, and is able to obtain her medication.  Follow-Up:  Pharmacist Review   Fanny Skates,  [place] Pharmacist Assistant [phone]  3 minutes spent in review, coordination, and documentation.   Reviewed by: De Blanch, PharmD, BCACP Clinical Pharmacist [facility] [phone]

## 2020-05-25 NOTE — Progress Notes (Signed)
HPI female never smoker followed for allergic rhinitis, asthma, complicated by history embolic CVA complicating C-spine surgery, DM 2  ---------------------------------------------------------------------  History of Present Illness:  05/27/2019-Asthma30 year old female never smoker followed for allergic rhinitis, asthma, complicated by history embolic CVA complicating C-spine surgery, DM 2 Follows for: asthma, allergic rhinitis  Ventolin hfa, Dymista, Advair 250, Dymista,  -----f/u Asthma. Patient stated breathig is at 7  Feels she "isn't getting enough air" through her nose and asks for refill of Dymista. Little cough or wheeze and continues Advair. No edema or palpitation.  Had flu vax. No increased use of rescue inhaler. No fever or purulent sputum.   Observations/Objective: CXR 07/19/2018-  IMPRESSION: Near complete resolution of the previously seen left basilar airspace process. Resolved left pleural effusion.  Assessment and Plan: Asthma- seems controlled on current meds Rhinitis- Ok to refill Dymsita, consider nasal saline  Follow Up Instructions: 1 year   05/26/20-79 year old female never smoker followed for allergic rhinitis, asthma, complicated by history embolic CVA complicating C-spine surgery, DM 2, hx DVT/PE 2006, HTN,  Follows for: asthma, allergic rhinitis  Ventolin hfa, Dymista, Advair 250,  PCP Rx'd prednisone and augmentin 12/2 for URI/ sinusitis Covid vax- 3 Moderna Flu vax- had -----Patient is doing good feels like she can breath better with the advair. Needs refills on her rescue inhaler and nasal spray.  Recent sinusitis responded to augmentin and prednisone. Feels recovered back to baseline. Continues Advair with little need for rescue inhaler   ROV-see HPI + = positive Constitutional:   No-   weight loss, night sweats, fevers, chills, fatigue, lassitude. HEENT:   No-  headaches, difficulty swallowing, tooth/dental problems, sore throat,       No-  sneezing, itching, ear ache, +nasal congestion, post nasal drip,  CV:  chest pain, no-orthopnea, PND, swelling in lower extremities, anasarca, dizziness, palpitations Resp: No-   shortness of breath with exertion or at rest.              No-   productive cough,  No non-productive cough,  No- coughing up of blood.              No-   change in color of mucus.  + Wheezing.   Skin: No-   rash or lesions. GI:  GU:  MS:  No-   joint pain or swelling.  No- decreased range of motion.  Back pain after surgery Neuro-     nothing unusual Psych:  No- change in mood or affect. No depression or anxiety.  No memory loss.  OBJ General- Alert, Oriented, Affect-appropriate, Distress- none acute, looks very well Skin- rash-none, lesions- none, excoriation- none Lymphadenopathy- none Head- atraumatic            Eyes- Gross vision intact, PERRLA, conjunctivae clear secretions            Ears- Hearing, canals-normal            Nose- Clear, no-Septal dev, mucus, polyps, erosion, perforation             Throat- Mallampati II , mucosa clear , drainage- none, tonsils- atrophic,  Neck- flexible , trachea midline, no stridor , thyroid nl, carotid no bruit Chest - symmetrical excursion , unlabored           Heart/CV- RRR , no murmur , no gallop  , no rub, nl s1 s2                           -  JVD- none , edema- none, stasis changes- none, varices- none           Lung-  Wheeze- none, cough-none , dullness-none, rub- none           Chest wall-  Abd-  Br/ Gen/ Rectal- Not done, not indicated Extrem- cyanosis- none, clubbing, none, atrophy- none, strength- nl, + cane Neuro- grossly intact to observation

## 2020-05-26 ENCOUNTER — Encounter: Payer: Self-pay | Admitting: Internal Medicine

## 2020-05-26 ENCOUNTER — Ambulatory Visit: Payer: Medicare Other | Admitting: Internal Medicine

## 2020-05-26 ENCOUNTER — Other Ambulatory Visit: Payer: Self-pay

## 2020-05-26 DIAGNOSIS — J302 Other seasonal allergic rhinitis: Secondary | ICD-10-CM

## 2020-05-26 DIAGNOSIS — J3089 Other allergic rhinitis: Secondary | ICD-10-CM

## 2020-05-26 DIAGNOSIS — J4520 Mild intermittent asthma, uncomplicated: Secondary | ICD-10-CM

## 2020-05-26 MED ORDER — ALBUTEROL SULFATE HFA 108 (90 BASE) MCG/ACT IN AERS: INHALATION_SPRAY | RESPIRATORY_TRACT | 4 refills | Status: DC

## 2020-05-26 MED ORDER — AZELASTINE-FLUTICASONE 137-50 MCG/ACT NA SUSP: NASAL | 12 refills | Status: DC

## 2020-05-26 NOTE — Patient Instructions (Signed)
Inhaler refill sent to mail order  Nasal spray sent to [facility]  Please call if we can help

## 2020-06-10 DIAGNOSIS — C44729 Squamous cell carcinoma of skin of left lower limb, including hip: Secondary | ICD-10-CM | POA: Diagnosis not present

## 2020-06-25 ENCOUNTER — Other Ambulatory Visit: Payer: Self-pay | Admitting: *Deleted

## 2020-06-25 DIAGNOSIS — M5416 Radiculopathy, lumbar region: Secondary | ICD-10-CM | POA: Diagnosis not present

## 2020-06-25 MED ORDER — ALENDRONATE SODIUM 70 MG PO TABS: 70.0000 mg | ORAL_TABLET | ORAL | 3 refills | Status: DC

## 2020-07-03 NOTE — Progress Notes (Incomplete)
Chronic Care Management Pharmacy Note  07/03/2020 Name:  [name] MRN:  [id number] DOB:  [birth year]  Subjective: [name] is an 80 y.o. year old female who is a primary patient of [name], DO.  The CCM team was consulted for assistance with disease management and care coordination needs.    Engaged with patient by telephone for follow up visit in response to provider referral for pharmacy case management and/or care coordination services.   Consent to Services:  The patient was given the following information about Chronic Care Management services today, agreed to services, and gave verbal consent: 1. CCM service includes personalized support from designated clinical staff supervised by the primary care provider, including individualized plan of care and coordination with other care providers 2. 24/7 contact phone numbers for assistance for urgent and routine care needs. 3. Service will only be billed when office clinical staff spend 20 minutes or more in a month to coordinate care. 4. Only one practitioner may furnish and bill the service in a calendar month. 5.The patient may stop CCM services at any time (effective at the end of the month) by phone call to the office staff. 6. The patient will be responsible for cost sharing (co-pay) of up to 20% of the service fee (after annual deductible is met). Patient agreed to services and consent obtained.  Patient Care Team: Carollee Herter, Alferd Apa, DO as PCP - General (Family Medicine) Deneise Lever, MD as Consulting Physician (Pulmonary Disease) Erline Levine, MD as Consulting Physician (Neurosurgery) Marlou Sa Tonna Corner, MD as Consulting Physician (Orthopedic Surgery) Mcarthur Rossetti, MD as Consulting Physician (Orthopedic Surgery) Danella Sensing, MD as Consulting Physician (Dermatology) Day, Melvenia Beam, [facility] as Pharmacist (Pharmacist)  Recent office visits: 02/07/20: Visit w/ Dr. Etter Sjogren - No med changes noted.  Recent  consult visits: 01/03/20: Ophthalmology visit w/ Dr. Antionette Fairy - No diabetic retinopathy noted.   Objective:  Lab Results  Component Value Date   CREATININE 0.90 08/02/2019   BUN 13 08/02/2019   GFR 60.48 08/02/2019   GFRNONAA >60 07/07/2018   GFRAA >60 07/07/2018   NA 139 08/02/2019   K 4.3 08/02/2019   CALCIUM 10.0 08/02/2019   CO2 26 08/02/2019    Lab Results  Component Value Date/Time   HGBA1C 7.0 (H) 02/07/2020 02:49 PM   HGBA1C 6.9 (H) 08/02/2019 11:13 AM   GFR 60.48 08/02/2019 11:13 AM   GFR 56.21 (L) 01/28/2019 02:25 PM   MICROALBUR 0.3 02/07/2020 02:49 PM   MICROALBUR <0.7 08/02/2019 11:13 AM    Last diabetic Eye exam:  Lab Results  Component Value Date/Time   HMDIABEYEEXA No Retinopathy 11/21/2016 12:00 AM    Last diabetic Foot exam: No results found for: HMDIABFOOTEX   Lab Results  Component Value Date   CHOL 177 08/02/2019   HDL 45.30 08/02/2019   LDLCALC 99 06/14/2018   LDLDIRECT 66.0 08/02/2019   TRIG (H) 08/02/2019    421.0 Triglyceride is over 400; calculations on Lipids are invalid.   CHOLHDL 4 08/02/2019    Hepatic Function Latest Ref Rng & Units 08/02/2019 01/28/2019 06/14/2018  Total Protein 6.0 - 8.3 g/dL 7.0 6.9 6.8  Albumin 3.5 - 5.2 g/dL 4.0 3.9 3.9  AST 0 - 37 U/L '20 19 16  ' ALT 0 - 35 U/L '23 18 15  ' Alk Phosphatase 39 - 117 U/L 68 80 85  Total Bilirubin 0.2 - 1.2 mg/dL 0.4 0.4 0.4  Bilirubin, Direct 0.0 - 0.3 mg/dL - - -  Lab Results  Component Value Date/Time   TSH 5.38 (H) 06/03/2016 12:13 PM   TSH 2.60 04/05/2013 08:54 AM   FREET4 0.8 (L) 05/08/2006 09:54 AM    CBC Latest Ref Rng & Units 01/28/2019 07/19/2018 07/07/2018  WBC 4.0 - 10.5 K/uL 8.6 7.2 -  Hemoglobin 12.0 - 15.0 g/dL 12.4 12.6 10.4(L)  Hematocrit 36.0 - 46.0 % 37.5 38.9 34.2(L)  Platelets 150.0 - 400.0 K/uL 218.0 302.0 -    Lab Results  Component Value Date/Time   VD25OH 55 11/08/2010 09:03 AM    Clinical ASCVD: {YES/NO:21197} The 10-year ASCVD risk score  Mikey Bussing DC Jr., et al., 2013) is: 48.9%   Values used to calculate the score:     Age: 30 years     Sex: Female     Is Non-Hispanic African American: No     Diabetic: Yes     Tobacco smoker: No     Systolic Blood Pressure: 859 mmHg     Is BP treated: No     HDL Cholesterol: 45.3 mg/dL     Total Cholesterol: 177 mg/dL    Depression screen [facility] 2/9 04/19/2019 12/11/2017 12/11/2017  Decreased Interest 0 0 0  Down, Depressed, Hopeless 0 0 0  PHQ - 2 Score 0 0 0  Some recent data might be hidden     ***Other: (CHADS2VASc if Afib, MMRC or CAT for COPD, ACT, DEXA)  Social History   Tobacco Use  Smoking Status Never Smoker  Smokeless Tobacco Never Used   BP Readings from Last 3 Encounters:  05/26/20 (!) 146/60  04/06/20 (!) 141/59  02/07/20 130/72   Pulse Readings from Last 3 Encounters:  05/26/20 96  04/06/20 63  02/07/20 67   Wt Readings from Last 3 Encounters:  05/26/20 165 lb 9.6 oz (75.1 kg)  04/06/20 165 lb 12.8 oz (75.2 kg)  02/07/20 162 lb 6.4 oz (73.7 kg)    Assessment/Interventions: Review of patient past medical history, allergies, medications, health status, including review of consultants reports, laboratory and other test data, was performed as part of comprehensive evaluation and provision of chronic care management services.   SDOH:  (Social Determinants of Health) assessments and interventions performed:   CCM Care Plan  Allergies  Allergen Reactions  . Statins Other (See Comments)    Myalgias and muscle weakness    Medications Reviewed Today    Reviewed by Clovis Fredrickson, CMA (Certified Medical Assistant) on 05/26/20 at 1004  Med List Status: <None>  Medication Order Taking? Sig Documenting Provider Last Dose Status Informant  acetaminophen (TYLENOL) 650 MG CR tablet [id number] Yes Take 650-1,300 mg by mouth 2 (two) times daily as needed for pain. [provider] Taking Active Self  albuterol (VENTOLIN HFA) 108 (90 Base) MCG/ACT inhaler  [id number] Yes INHALE 2 PUFFS INTO THE LUNGS EVERY 6 HOURS AS NEEDED FOR WHEEZING OR SHORTNESS OF BREATH Young, Clinton D, MD Taking Active   alendronate (FOSAMAX) 70 MG tablet [id number] Yes Take 1 tablet (70 mg total) by mouth once a week. [name], DO Taking Active   aspirin 81 MG chewable tablet [id number] Yes Chew 1 tablet (81 mg total) by mouth 2 (two) times daily.  Patient taking differently: Chew 81 mg by mouth daily.   Mcarthur Rossetti, MD Taking Active Self  Azelastine-Fluticasone [facility]) 137-50 MCG/ACT Elizbeth Squires [id number] Yes 1-2 puffs each nostril once or twice daily Baird Lyons D, MD Taking Active   Calcium Citrate-Vitamin D (CALCIUM + D PO) [id number]  Yes Take 1 tablet by mouth daily. [provider] Taking Active Self  Cholecalciferol (VITAMIN D) 50 MCG (2000 UT) CAPS [id number] Yes Take 2,000 Units by mouth daily.  [provider] Taking Active Self  ezetimibe (ZETIA) 10 MG tablet [id number] Yes Take 1 tablet (10 mg total) by mouth daily. [name] R, DO Taking Active   ferrous sulfate (FEROSUL) 325 (65 FE) MG tablet [id number] Yes TAKE 1 TABLET(325 MG) BY MOUTH DAILY WITH BREAKFAST Carollee Herter, Alferd Apa, DO Taking Active   Fluticasone-Salmeterol (ADVAIR DISKUS) 250-50 MCG/DOSE AEPB [id number] Yes Inhale 1 puff into the lungs 2 (two) times daily. USE 1 INHALATION TWO TIMES  DAILY ; RINSE MOUTH Deneise Lever, MD Taking Active   Fluticasone-Salmeterol (ADVAIR DISKUS) 250-50 MCG/DOSE AEPB [id number] Yes Inhale 1 puff into the lungs 2 (two) times daily. Rinse mouth Baird Lyons D, MD Taking Active   Lidocaine 4 % [facility] [id number] Yes Apply 1 patch topically as needed (back pain). [provider] Taking Active Self  metFORMIN (GLUCOPHAGE-XR) 500 MG 24 hr tablet [id number] Yes Take 2 tablets by mouth daily Carollee Herter, Alferd Apa, DO Taking Active   mirabegron ER (MYRBETRIQ) 50 MG TB24 tablet [id number] Yes TAKE 1 TABLET(50 MG) BY MOUTH DAILY  [name], DO Taking Active            Med Note Quinn Axe Apr 06, 2020 12:04 PM) Uses as needed  Multiple Vitamin (MULTIVITAMIN WITH MINERALS) TABS tablet [id number] Yes Take 1 tablet by mouth daily. [provider] Taking Active Self  Multiple Vitamins-Minerals (OCUVITE EXTRA PO) [id number] Yes Take 1 tablet by mouth daily. [provider] Taking Active Self           Med Note Tamala Julian, Valentino Nose   Tue Feb 27, 2018 11:00 AM)    mupirocin ointment (BACTROBAN) 2 % [id number] Yes Apply 1 application topically daily as needed (hip immflamation).  [provider] Taking Active Self  Polyethyl Glycol-Propyl Glycol (SYSTANE OP) [id number] Yes Place 1 drop into both eyes daily as needed (dry eyes). [provider] Taking Active Self  polyethylene glycol (MIRALAX / GLYCOLAX) packet [id number] Yes Take 17 g by mouth daily as needed for mild constipation.  [provider] Taking Active Self  pregabalin (LYRICA) 100 MG capsule [id number] Yes TAKE 1 CAPSULE(100 MG) BY MOUTH THREE TIMES DAILY Carollee Herter, Alferd Apa, DO Taking Active   Trolamine Salicylate (ASPERCREME EX) [id number] Yes Apply 1 application topically daily as needed (back pain). [provider] Taking Active Self  Med List Note Annamaria Boots, Kasandra Knudsen, MD 05/12/11 2153): Allergy vaccine 1:10 GO          Patient Active Problem List   Diagnosis Date Noted  . Osteoarthritis 08/02/2019  . Uncontrolled type 2 diabetes mellitus with hyperglycemia ([place]) 01/28/2019  . Essential hypertension 01/28/2019  . Hyperlipidemia associated with type 2 diabetes mellitus ([place]) 01/28/2019  . Breast pain, left 01/28/2019  . CAP (community acquired pneumonia) 07/06/2018  . Pleuritic chest pain 07/06/2018  . Pleural effusion   . Status post total replacement of right hip 02/23/2018  . Cellulitis 12/28/2017  . Unilateral primary osteoarthritis, right hip 12/14/2017  . Type 2 diabetes mellitus with  diabetic neuropathy ([place]) 06/08/2017  . Neuropathic pain 03/30/2017  . Seasonal allergies 03/30/2017  . Lumbar scoliosis 08/12/2016  . Controlled diabetes mellitus type 2 with complications ([place]) 11/23/9483  . Scoliosis 11/24/2015  . Low back pain 09/29/2014  . Diabetes  mellitus with peripheral vascular disease ([place]) 04/10/2013  . Left carotid bruit 04/10/2013  . Anemia 03/31/2013  . Degenerative arthritis of hip 06/01/2012  . POSTMENOPAUSAL SYNDROME 09/16/2009  . ARTHRALGIA 09/16/2009  . Seasonal and perennial allergic rhinitis 10/08/2007  . Osteopenia 08/06/2007  . ELEVATED BLOOD PRESSURE WITHOUT DIAGNOSIS OF HYPERTENSION 08/06/2007  . HYPERLIPIDEMIA 05/14/2007  . Asthma, mild intermittent 05/14/2007  . History of pulmonary embolus (PE) 08/31/2006    Immunization History  Administered Date(s) Administered  . Fluad Quad(high Dose 65+) 01/28/2019, 03/31/2020  . Influenza Inj Mdck Quad With Preservative 03/23/2018  . Influenza Split 03/15/2012  . Influenza Whole 02/27/2009  . Influenza, High Dose Seasonal PF 03/19/2013, 03/21/2016  . Influenza,inj,Quad PF,6+ Mos 03/13/2014, 02/16/2015  . Influenza-Unspecified 03/15/2012, 01/28/2014, 03/13/2014, 02/16/2015, 02/14/2017  . Moderna SARS-COV2 Booster Vaccination 04/14/2020  . Moderna Sars-Covid-2 Vaccination 06/11/2019, 07/09/2019  . Pneumococcal Conjugate-13 05/16/2013  . Pneumococcal Polysaccharide-23 09/29/2014, 02/07/2020  . Tdap 10/28/2010  . Zoster 05/12/2014  . Zoster Recombinat (Shingrix) 02/11/2019, 04/12/2019    Conditions to be addressed/monitored:  Hypertension, Hyperlipidemia, Diabetes, Asthma, Osteopenia, Overactive Bladder, Neuropathy, Anemia  There are no care plans that you recently modified to display for this patient.    Medication Assistance: {MEDASSISTANCEINFO:25044}  Patient's preferred pharmacy is:  [facility] [place], [address] AT [address] [place] [address] Phone: [phone] Fax: [place], [address], [address] [place], [address] [place] [address] Phone: [phone] Fax: [phone]  Uses pill box? {Yes or If no, why not?:20788} Pt endorses ***% compliance  We discussed: {Pharmacy options:24294} Patient decided to: {US Pharmacy FHQR:97588}  Follow Up:  {FOLLOWUP:24991}  Plan: {CM FOLLOW UP PLAN:25073}  ***   Current Barriers:  . {pharmacybarriers:24917} . ***  Pharmacist Clinical Goal(s):  [name] Over the next *** days, patient will {PHARMACYGOALCHOICES:24921} through collaboration with PharmD and provider.  . ***  Interventions: . 1:1 collaboration with Carollee Herter, Alferd Apa, DO regarding development and update of comprehensive plan of care as evidenced by provider attestation and co-signature . Inter-disciplinary care team collaboration (see longitudinal plan of care) . Comprehensive medication review performed; medication list updated in electronic medical record  Hypertension (BP goal {CHL HP UPSTREAM Pharmacist BP [phone]}) -{CHL Controlled/Uncontrolled:[phone]} -Current treatment: . None -Medications previously tried: ***  -Current home readings: *** -Current dietary habits: *** -Current exercise habits: *** -{ACTIONS;DENIES/REPORTS:21021675::"Denies"} hypotensive/hypertensive symptoms -Educated on {CCM BP Counseling:25124} -Counseled to monitor BP at home ***, document, and provide log at future appointments -{CCMPHARMDINTERVENTION:25122}  Hyperlipidemia: (LDL goal < ***) -{CHL Controlled/Uncontrolled:[phone]} -Current treatment: . Zetia 29m daily -Medications previously tried: ***  -Current dietary patterns: *** -Current exercise habits: *** -Educated on {CCM HLD Counseling:25126} -{CCMPHARMDINTERVENTION:25122}  Diabetes w/ neuropathy (A1c goal {A1c goals:23924}) -{CHL  Controlled/Uncontrolled:[phone]} -Current medications: .Marland KitchenMetformin XR 5060mtwo tablets daily . Lyrica 10066mID -Medications previously tried: ***  -Current home glucose readings . fasting glucose: *** . post prandial glucose: *** -{ACTIONS;DENIES/REPORTS:21021675::"Denies"} hypoglycemic/hyperglycemic symptoms -Current meal patterns:  . breakfast: ***  . lunch: ***  . dinner: *** . snacks: *** . drinks: *** -Current exercise: *** -Educated on{CCM DM COUNSELING:25123} -Counseled to check feet daily and get yearly eye exams -{CCMPHARMDINTERVENTION:25122}  Asthma (Goal: control symptoms and prevent exacerbations) -{CHL Controlled/Uncontrolled:[phone]} -Current treatment  . Dymista 137/50 mcg . Advair 250-50 mcg -Medications previously tried: ***   -MMRC/CAT score: *** -Pulmonary function testing: *** -  Exacerbations requiring treatment in last 6 months: *** -Patient {Actions; denies-reports:120008} consistent use of maintenance inhaler -Frequency of rescue inhaler use: *** -Counseled on {CCMINHALERCOUNSELING:25121} -{CCMPHARMDINTERVENTION:25122}  Osteopenia (Goal ***) -{CHL Controlled/[phone]} -Last DEXA Scan: ***   T-Score femoral neck: ***  T-Score total hip: ***  T-Score lumbar spine: ***  T-Score forearm radius: ***  10-year probability of major osteoporotic fracture: ***  10-year probability of hip fracture: *** -Patient {is;is not an osteoporosis candidate:23886} -Current treatment  . Fosamax 22m weekly . Calcium + Vitamin D -Medications previously tried: ***  -{Osteoporosis Counseling:23892} -{CCMPHARMDINTERVENTION:25122}  Overactive bladder (Goal: Minimize symptoms) -{CHL Controlled/[phone]} -Current treatment  . Myrbetriq ER 546m-Medications previously tried: ***  -{CCMPHARMDINTERVENTION:25122}   Patient Goals/Self-Care Activities . Over the next *** days, patient will:  -  {pharmacypatientgoals:24919}  Follow Up Plan: {CM FOLLOW UP PLIHKV:42595}

## 2020-07-07 ENCOUNTER — Telehealth: Payer: Medicare Other

## 2020-07-16 ENCOUNTER — Encounter: Payer: Self-pay | Admitting: Internal Medicine

## 2020-07-16 NOTE — Assessment & Plan Note (Signed)
Resolved sinusitis Plan- refill Dymista nasal spray

## 2020-07-16 NOTE — Assessment & Plan Note (Signed)
Uncomplicated, well controlled now Plan- refill Ventolin hfa

## 2020-07-17 DIAGNOSIS — M48062 Spinal stenosis, lumbar region with neurogenic claudication: Secondary | ICD-10-CM | POA: Diagnosis not present

## 2020-07-17 DIAGNOSIS — M5416 Radiculopathy, lumbar region: Secondary | ICD-10-CM | POA: Diagnosis not present

## 2020-07-24 ENCOUNTER — Telehealth: Payer: Self-pay | Admitting: Pharmacist

## 2020-07-24 NOTE — Progress Notes (Signed)
Chronic Care Management Pharmacy Assistant   Name: [name]  MRN: [id number] DOB: 10-Jan-1941  Reason for Encounter: Adherence Review  PCP : [name], DO   Office Visit: 05/26/20 Pulmonology Deneise Lever, MD. Asthma. STOPPED Celecoxib cyclobenzaprine and tramadol.  Allergies:   Allergies  Allergen Reactions  . Statins Other (See Comments)    Myalgias and muscle weakness    Medications: Outpatient Encounter Medications as of 07/24/2020  Medication Sig Note  . acetaminophen (TYLENOL) 650 MG CR tablet Take 650-1,300 mg by mouth 2 (two) times daily as needed for pain.   [name] albuterol (VENTOLIN HFA) 108 (90 Base) MCG/ACT inhaler Inhale 2 puffs every 6 hours if needed for breathing   . alendronate (FOSAMAX) 70 MG tablet Take 1 tablet (70 mg total) by mouth once a week.   [name] aspirin 81 MG chewable tablet Chew 1 tablet (81 mg total) by mouth 2 (two) times daily. (Patient taking differently: Chew 81 mg by mouth daily.)   . Azelastine-Fluticasone (DYMISTA) 137-50 MCG/ACT SUSP 1-2 puffs each nostril once or twice daily   . Calcium Citrate-Vitamin D (CALCIUM + D PO) Take 1 tablet by mouth daily.   . Cholecalciferol (VITAMIN D) 50 MCG (2000 UT) CAPS Take 2,000 Units by mouth daily.    [name] ezetimibe (ZETIA) 10 MG tablet Take 1 tablet (10 mg total) by mouth daily.   . ferrous sulfate (FEROSUL) 325 (65 FE) MG tablet TAKE 1 TABLET(325 MG) BY MOUTH DAILY WITH BREAKFAST   . Fluticasone-Salmeterol (ADVAIR DISKUS) 250-50 MCG/DOSE AEPB Inhale 1 puff into the lungs 2 (two) times daily. USE 1 INHALATION TWO TIMES  DAILY ; RINSE MOUTH   . Fluticasone-Salmeterol (ADVAIR DISKUS) 250-50 MCG/DOSE AEPB Inhale 1 puff into the lungs 2 (two) times daily. Rinse mouth   . Lidocaine 4 % [facility] Apply 1 patch topically as needed (back pain).   . metFORMIN (GLUCOPHAGE-XR) 500 MG 24 hr tablet Take 2 tablets by mouth daily   . mirabegron ER (MYRBETRIQ) 50 MG TB24 tablet TAKE 1 TABLET(50 MG) BY MOUTH DAILY  04/06/2020: Uses as needed  . Multiple Vitamin (MULTIVITAMIN WITH MINERALS) TABS tablet Take 1 tablet by mouth daily.   . Multiple Vitamins-Minerals (OCUVITE EXTRA PO) Take 1 tablet by mouth daily.   . mupirocin ointment (BACTROBAN) 2 % Apply 1 application topically daily as needed (hip immflamation).    Vladimir Faster Glycol-Propyl Glycol (SYSTANE OP) Place 1 drop into both eyes daily as needed (dry eyes).   . polyethylene glycol (MIRALAX / GLYCOLAX) packet Take 17 g by mouth daily as needed for mild constipation.    . pregabalin (LYRICA) 100 MG capsule TAKE 1 CAPSULE(100 MG) BY MOUTH THREE TIMES DAILY   . Trolamine Salicylate (ASPERCREME EX) Apply 1 application topically daily as needed (back pain).    No facility-administered encounter medications on file as of 07/24/2020.    Current Diagnosis: Patient Active Problem List   Diagnosis Date Noted  . Osteoarthritis 08/02/2019  . Uncontrolled type 2 diabetes mellitus with hyperglycemia ([place]) 01/28/2019  . Essential hypertension 01/28/2019  . Hyperlipidemia associated with type 2 diabetes mellitus ([place]) 01/28/2019  . Breast pain, left 01/28/2019  . CAP (community acquired pneumonia) 07/06/2018  . Pleuritic chest pain 07/06/2018  . Pleural effusion   . Status post total replacement of right hip 02/23/2018  . Cellulitis 12/28/2017  . Unilateral primary osteoarthritis, right hip 12/14/2017  . Type 2 diabetes mellitus with diabetic neuropathy ([place]) 06/08/2017  . Neuropathic  pain 03/30/2017  . Seasonal allergies 03/30/2017  . Lumbar scoliosis 08/12/2016  . Controlled diabetes mellitus type 2 with complications ([place]) 67/34/1937  . Scoliosis 11/24/2015  . Low back pain 09/29/2014  . Diabetes mellitus with peripheral vascular disease ([place]) 04/10/2013  . Left carotid bruit 04/10/2013  . Anemia 03/31/2013  . Degenerative arthritis of hip 06/01/2012  . POSTMENOPAUSAL SYNDROME 09/16/2009  . ARTHRALGIA 09/16/2009  . Seasonal and perennial allergic  rhinitis 10/08/2007  . Osteopenia 08/06/2007  . ELEVATED BLOOD PRESSURE WITHOUT DIAGNOSIS OF HYPERTENSION 08/06/2007  . HYPERLIPIDEMIA 05/14/2007  . Asthma, mild intermittent 05/14/2007  . History of pulmonary embolus (PE) 08/31/2006    Goals Addressed   None    Reviewed the patients chart for any health and/or medications changes, documented above.   Follow-Up:  Pharmacist Review   Charlann Lange, [place] Pharmacist Assistant [phone]

## 2020-07-29 ENCOUNTER — Other Ambulatory Visit: Payer: Self-pay

## 2020-07-29 DIAGNOSIS — E1165 Type 2 diabetes mellitus with hyperglycemia: Secondary | ICD-10-CM

## 2020-07-29 MED ORDER — METFORMIN HCL ER 500 MG PO TB24: ORAL_TABLET | ORAL | 1 refills | Status: DC

## 2020-08-05 ENCOUNTER — Telehealth: Payer: Self-pay | Admitting: Pharmacist

## 2020-08-05 NOTE — Progress Notes (Addendum)
Chronic Care Management Pharmacy Assistant   Name: [name]  MRN: [id number] DOB: 1940-10-26  Reason for Encounter: Disease State For DM.   Conditions to be addressed/monitored:  Hypertension, Hyperlipidemia, Diabetes, Asthma, Osteopenia, Overactive Bladder, Neuropathy, Anemia.  Recent office visits: None since 07/24/20  Recent consult visits: None since 07/24/20  Hospital visits:  None in previous 6 months  Medications: Outpatient Encounter Medications as of 08/05/2020  Medication Sig Note   acetaminophen (TYLENOL) 650 MG CR tablet Take 650-1,300 mg by mouth 2 (two) times daily as needed for pain.    albuterol (VENTOLIN HFA) 108 (90 Base) MCG/ACT inhaler Inhale 2 puffs every 6 hours if needed for breathing    alendronate (FOSAMAX) 70 MG tablet Take 1 tablet (70 mg total) by mouth once a week.    aspirin 81 MG chewable tablet Chew 1 tablet (81 mg total) by mouth 2 (two) times daily. (Patient taking differently: Chew 81 mg by mouth daily.)    Azelastine-Fluticasone (DYMISTA) 137-50 MCG/ACT SUSP 1-2 puffs each nostril once or twice daily    Calcium Citrate-Vitamin D (CALCIUM + D PO) Take 1 tablet by mouth daily.    Cholecalciferol (VITAMIN D) 50 MCG (2000 UT) CAPS Take 2,000 Units by mouth daily.     ezetimibe (ZETIA) 10 MG tablet Take 1 tablet (10 mg total) by mouth daily.    ferrous sulfate (FEROSUL) 325 (65 FE) MG tablet TAKE 1 TABLET(325 MG) BY MOUTH DAILY WITH BREAKFAST    Fluticasone-Salmeterol (ADVAIR DISKUS) 250-50 MCG/DOSE AEPB Inhale 1 puff into the lungs 2 (two) times daily. USE 1 INHALATION TWO TIMES  DAILY ; RINSE MOUTH    Fluticasone-Salmeterol (ADVAIR DISKUS) 250-50 MCG/DOSE AEPB Inhale 1 puff into the lungs 2 (two) times daily. Rinse mouth    Lidocaine 4 % PTCH Apply 1 patch topically as needed (back pain).    metFORMIN (GLUCOPHAGE-XR) 500 MG 24 hr tablet Take 2 tablets by mouth daily    mirabegron ER (MYRBETRIQ) 50 MG TB24 tablet TAKE 1 TABLET(50 MG) BY MOUTH DAILY  04/06/2020: Uses as needed   Multiple Vitamin (MULTIVITAMIN WITH MINERALS) TABS tablet Take 1 tablet by mouth daily.    Multiple Vitamins-Minerals (OCUVITE EXTRA PO) Take 1 tablet by mouth daily.    mupirocin ointment (BACTROBAN) 2 % Apply 1 application topically daily as needed (hip immflamation).     Polyethyl Glycol-Propyl Glycol (SYSTANE OP) Place 1 drop into both eyes daily as needed (dry eyes).    polyethylene glycol (MIRALAX / GLYCOLAX) packet Take 17 g by mouth daily as needed for mild constipation.     pregabalin (LYRICA) 100 MG capsule TAKE 1 CAPSULE(100 MG) BY MOUTH THREE TIMES DAILY    Trolamine Salicylate (ASPERCREME EX) Apply 1 application topically daily as needed (back pain).    No facility-administered encounter medications on file as of 08/05/2020.   Recent Relevant Labs: Lab Results  Component Value Date/Time   HGBA1C 7.0 (H) 02/07/2020 02:49 PM   HGBA1C 6.9 (H) 08/02/2019 11:13 AM   MICROALBUR 0.3 02/07/2020 02:49 PM   MICROALBUR <0.7 08/02/2019 11:13 AM    Kidney Function Lab Results  Component Value Date/Time   CREATININE 0.90 08/02/2019 11:13 AM   CREATININE 0.96 01/28/2019 02:25 PM   GFR 60.48 08/02/2019 11:13 AM   GFRNONAA >60 07/07/2018 05:55 AM   GFRAA >60 07/07/2018 05:55 AM    Current antihyperglycemic regimen:  Metformin 500 mg 2 tablets daily  What recent interventions/DTPs have been made to improve glycemic control:  None  Have there been any recent hospitalizations or ED visits since last visit with CPP? Patient stated no.  Patient denies hypoglycemic symptoms, including None   Patient denies hyperglycemic symptoms, including none   How often are you checking your blood sugar? once daily and twice daily   What are your blood sugars ranging? Patient stated she was getting ready to walk out the door and was unable to give me any blood sugar readings.   During the week, how often does your blood glucose drop below 70? Patient stated no.  Are  you checking your feet daily/regularly?  Patient stated yes she checks her feet daily/regularly.  Adherence Review: Is the patient currently on a STATIN medication? No.  Is the patient currently on ACE/ARB medication? No.  Does the patient have >5 day gap between last estimated fill dates? Per Misc Rpts, No.  Star Rating Drugs: Metformin 05/17/20 90 DS   Follow-Up: Pharmacists Review  Charlann Lange, RMA Clinical Pharmacist Assistant [phone]  5 minutes spent in review, coordination, and documentation.  Reviewed by: Beverly Milch, PharmD Clinical Pharmacist [facility] [phone]

## 2020-08-11 ENCOUNTER — Ambulatory Visit (INDEPENDENT_AMBULATORY_CARE_PROVIDER_SITE_OTHER): Payer: Medicare Other | Admitting: Family Medicine

## 2020-08-11 ENCOUNTER — Encounter: Payer: Self-pay | Admitting: Family Medicine

## 2020-08-11 ENCOUNTER — Other Ambulatory Visit: Payer: Self-pay

## 2020-08-11 VITALS — BP 136/72 | HR 63 | Temp 98.0°F | Resp 18 | Ht 63.5 in | Wt 164.6 lb

## 2020-08-11 DIAGNOSIS — M545 Low back pain, unspecified: Secondary | ICD-10-CM | POA: Diagnosis not present

## 2020-08-11 DIAGNOSIS — E785 Hyperlipidemia, unspecified: Secondary | ICD-10-CM

## 2020-08-11 DIAGNOSIS — G8929 Other chronic pain: Secondary | ICD-10-CM

## 2020-08-11 DIAGNOSIS — I10 Essential (primary) hypertension: Secondary | ICD-10-CM

## 2020-08-11 DIAGNOSIS — E782 Mixed hyperlipidemia: Secondary | ICD-10-CM | POA: Diagnosis not present

## 2020-08-11 DIAGNOSIS — E1165 Type 2 diabetes mellitus with hyperglycemia: Secondary | ICD-10-CM

## 2020-08-11 DIAGNOSIS — E1169 Type 2 diabetes mellitus with other specified complication: Secondary | ICD-10-CM

## 2020-08-11 DIAGNOSIS — E1151 Type 2 diabetes mellitus with diabetic peripheral angiopathy without gangrene: Secondary | ICD-10-CM

## 2020-08-11 LAB — LDL CHOLESTEROL, DIRECT: Direct LDL: 61 mg/dL

## 2020-08-11 LAB — COMPREHENSIVE METABOLIC PANEL
ALT: 22 U/L (ref 0–35)
AST: 18 U/L (ref 0–37)
Albumin: 3.7 g/dL (ref 3.5–5.2)
Alkaline Phosphatase: 66 U/L (ref 39–117)
BUN: 19 mg/dL (ref 6–23)
CO2: 27 mEq/L (ref 19–32)
Calcium: 9.2 mg/dL (ref 8.4–10.5)
Chloride: 104 mEq/L (ref 96–112)
Creatinine, Ser: 0.94 mg/dL (ref 0.40–1.20)
GFR: 57.7 mL/min — ABNORMAL LOW (ref >60.00)
Glucose, Bld: 138 mg/dL — ABNORMAL HIGH (ref 70–99)
Potassium: 4.1 mEq/L (ref 3.5–5.1)
Sodium: 138 mEq/L (ref 135–145)
Total Bilirubin: 0.4 mg/dL (ref 0.2–1.2)
Total Protein: 6.4 g/dL (ref 6.0–8.3)

## 2020-08-11 LAB — MICROALBUMIN / CREATININE URINE RATIO
Creatinine,U: 71.8 mg/dL
Microalb Creat Ratio: 1 mg/g (ref 0.0–30.0)
Microalb, Ur: <0.7 mg/dL (ref 0.0–1.9)

## 2020-08-11 LAB — LIPID PANEL
Cholesterol: 222 mg/dL — ABNORMAL HIGH (ref 0–200)
HDL: 39.1 mg/dL (ref >39.00)
Total CHOL/HDL Ratio: 6
Triglycerides: 604 mg/dL — ABNORMAL HIGH (ref 0.0–149.0)

## 2020-08-11 LAB — HEMOGLOBIN A1C: Hgb A1c MFr Bld: 7.1 % — ABNORMAL HIGH (ref 4.6–6.5)

## 2020-08-11 MED ORDER — CELECOXIB 200 MG PO CAPS
200.0000 mg | ORAL_CAPSULE | Freq: Every day | ORAL | 1 refills | Status: DC
Start: 2020-08-11 — End: 2021-06-11

## 2020-08-11 MED ORDER — CYCLOBENZAPRINE HCL 10 MG PO TABS: 10.0000 mg | ORAL_TABLET | Freq: Three times a day (TID) | ORAL | 2 refills | Status: DC | PRN

## 2020-08-11 MED ORDER — BLOOD GLUCOSE MONITOR KIT: PACK | 0 refills | Status: AC

## 2020-08-11 NOTE — Assessment & Plan Note (Signed)
Encouraged heart healthy diet, increase exercise, avoid trans fats, consider a krill oil cap daily 

## 2020-08-11 NOTE — Assessment & Plan Note (Signed)
Well controlled, no changes to meds. Encouraged heart healthy diet such as the DASH diet and exercise as tolerated.  °

## 2020-08-11 NOTE — Progress Notes (Signed)
Patient ID: [name], female    DOB: 09-16-1940  Age: 80 y.o. MRN: [id number]    Subjective:  Subjective  HPI [name] presents for f/u dm, chol and bp.  She also c/o pain L thigh x several months.  No known injury  She is seeing [place] [facility] for her back and piriformis muscle   HPI HYPERTENSION   Blood pressure range-not checking   Chest pain- no      Dyspnea- no Lightheadedness- no   Edema- no  Other side effects - no   Medication compliance: good Low salt diet- yes     DIABETES    Blood Sugar ranges-108-165  Polyuria- no New Visual problems- no  Hypoglycemic symptoms- no  Other side effects-no Medication compliance - good Foot exam- today   HYPERLIPIDEMIA  Medication compliance- good RUQ pain- no  Muscle aches- no Other side effects-no       Review of Systems  Constitutional: Negative for appetite change, diaphoresis, fatigue and unexpected weight change.  Eyes: Negative for pain, redness and visual disturbance.  Respiratory: Negative for cough, chest tightness, shortness of breath and wheezing.   Cardiovascular: Negative for chest pain, palpitations and leg swelling.  Endocrine: Negative for cold intolerance, heat intolerance, polydipsia, polyphagia and polyuria.  Genitourinary: Negative for difficulty urinating, dysuria and frequency.  Neurological: Negative for dizziness, light-headedness, numbness and headaches.    History Past Medical History:  Diagnosis Date  . Anemia   . Arthritis   . Asthma    adult onset  . Basal cell cancer    LUE; Porokeratosis also  . Chronic kidney disease 1963   strep in kidney due to strep throat-hospitalized 10 days  . Complication of anesthesia    small trachea  . Diabetes mellitus 2010   A1c 6.7%  . DVT (deep venous thrombosis) ([place]) 2006   post immobilization post cns surgery  . GERD (gastroesophageal reflux disease)    very mild  . Granulomatous lung disease ([place]) 2002   incidental Xray finding   . Hyperlipidemia   . Hypertension 2004   Hypertensive response on Stress Test  . Paralysis ([place]) 2006   post cervical fusion with spinal sac tear  with hematoma   . PTE (pulmonary thromboembolism) ([place]) 2006    She has a past surgical history that includes Bunionectomy; Septoplasty; Tubal ligation; Colonoscopy (1992 & 2002); Rotator cuff repair (2009); epidural steroids (2006); Cervical fusion (2006); Tonsillectomy (80 years old); Total hip arthroplasty (06/01/2012); Anterior lumbar fusion (N/A, 08/12/2016); Anterior lat lumbar fusion (N/A, 08/12/2016); Lumbar percutaneous pedicle screw 4 level (N/A, 08/12/2016); Abdominal exposure (N/A, 08/12/2016); Total hip arthroplasty (Right, 02/23/2018); Cataract extraction (Right, 12/31/2018); and Cataract extraction (Left, 12/17/2018).   Her family history includes COPD in her father; Cancer in her mother; Diabetes in her sister; Heart disease in her sister; Stroke in her maternal grandmother; Transient ischemic attack in her paternal aunt.She reports that she has never smoked. She has never used smokeless tobacco. She reports current alcohol use of about 1.0 standard drink of alcohol per week. She reports that she does not use drugs.  Current Outpatient Medications on File Prior to Visit  Medication Sig Dispense Refill  . acetaminophen (TYLENOL) 650 MG CR tablet Take 650-1,300 mg by mouth 2 (two) times daily as needed for pain.    [name] albuterol (VENTOLIN HFA) 108 (90 Base) MCG/ACT inhaler Inhale 2 puffs every 6 hours if needed for breathing 25.5 g 4  . alendronate (FOSAMAX) 70 MG tablet Take  1 tablet (70 mg total) by mouth once a week. 12 tablet 3  . aspirin 81 MG chewable tablet Chew 1 tablet (81 mg total) by mouth 2 (two) times daily. (Patient taking differently: Chew 81 mg by mouth daily.) 30 tablet 0  . Azelastine-Fluticasone (DYMISTA) 137-50 MCG/ACT SUSP 1-2 puffs each nostril once or twice daily 23 g 12  . Calcium Citrate-Vitamin D (CALCIUM + D PO) Take 1  tablet by mouth daily.    . Cholecalciferol (VITAMIN D) 50 MCG (2000 UT) CAPS Take 2,000 Units by mouth daily.     [name] ezetimibe (ZETIA) 10 MG tablet Take 1 tablet (10 mg total) by mouth daily. 90 tablet 1  . ferrous sulfate (FEROSUL) 325 (65 FE) MG tablet TAKE 1 TABLET(325 MG) BY MOUTH DAILY WITH BREAKFAST 90 tablet 1  . Fluticasone-Salmeterol (ADVAIR DISKUS) 250-50 MCG/DOSE AEPB Inhale 1 puff into the lungs 2 (two) times daily. USE 1 INHALATION TWO TIMES  DAILY ; RINSE MOUTH 60 each 5  . Fluticasone-Salmeterol (ADVAIR DISKUS) 250-50 MCG/DOSE AEPB Inhale 1 puff into the lungs 2 (two) times daily. Rinse mouth 180 each 4  . Lidocaine 4 % PTCH Apply 1 patch topically as needed (back pain).    . metFORMIN (GLUCOPHAGE-XR) 500 MG 24 hr tablet Take 2 tablets by mouth daily 180 tablet 1  . mirabegron ER (MYRBETRIQ) 50 MG TB24 tablet TAKE 1 TABLET(50 MG) BY MOUTH DAILY 30 tablet 5  . Multiple Vitamin (MULTIVITAMIN WITH MINERALS) TABS tablet Take 1 tablet by mouth daily.    . Multiple Vitamins-Minerals (OCUVITE EXTRA PO) Take 1 tablet by mouth daily.    . mupirocin ointment (BACTROBAN) 2 % Apply 1 application topically daily as needed (hip immflamation).     Vladimir Faster Glycol-Propyl Glycol (SYSTANE OP) Place 1 drop into both eyes daily as needed (dry eyes).    . polyethylene glycol (MIRALAX / GLYCOLAX) packet Take 17 g by mouth daily as needed for mild constipation.     . pregabalin (LYRICA) 100 MG capsule TAKE 1 CAPSULE(100 MG) BY MOUTH THREE TIMES DAILY 270 capsule 1  . Trolamine Salicylate (ASPERCREME EX) Apply 1 application topically daily as needed (back pain).     No current facility-administered medications on file prior to visit.     Objective:  Objective  Physical Exam Vitals and nursing note reviewed.  Constitutional:      Appearance: She is well-developed.  HENT:     Head: Normocephalic and atraumatic.  Eyes:     Conjunctiva/sclera: Conjunctivae normal.  Neck:     Thyroid: No  thyromegaly.     Vascular: No carotid bruit or JVD.  Cardiovascular:     Rate and Rhythm: Normal rate and regular rhythm.     Heart sounds: Normal heart sounds. No murmur heard.   Pulmonary:     Effort: Pulmonary effort is normal. No respiratory distress.     Breath sounds: Normal breath sounds. No wheezing or rales.  Chest:     Chest wall: No tenderness.  Musculoskeletal:     Cervical back: Normal range of motion and neck supple.  Neurological:     Mental Status: She is alert and oriented to person, place, and time.    Diabetic Foot Exam - Simple   Simple Foot Form Diabetic Foot exam was performed with the following findings: Yes 08/11/2020 10:35 AM  Visual Inspection No deformities, no ulcerations, no other skin breakdown bilaterally: Yes Sensation Testing Intact to touch and monofilament testing bilaterally: Yes Pulse Check  Posterior Tibialis and Dorsalis pulse intact bilaterally: Yes Comments     BP 136/72 (BP Location: Right Arm, Patient Position: Sitting, Cuff Size: Normal)   Pulse 63   Temp 98 F (36.7 C) (Oral)   Resp 18   Ht 5' 3.5" (1.613 m)   Wt 164 lb 9.6 oz (74.7 kg)   SpO2 99%   BMI 28.70 kg/m  Wt Readings from Last 3 Encounters:  08/11/20 164 lb 9.6 oz (74.7 kg)  05/26/20 165 lb 9.6 oz (75.1 kg)  04/06/20 165 lb 12.8 oz (75.2 kg)     Lab Results  Component Value Date   WBC 8.6 01/28/2019   HGB 12.4 01/28/2019   HCT 37.5 01/28/2019   PLT 218.0 01/28/2019   GLUCOSE 129 (H) 08/02/2019   CHOL 177 08/02/2019   TRIG (H) 08/02/2019    421.0 Triglyceride is over 400; calculations on Lipids are invalid.   HDL 45.30 08/02/2019   LDLDIRECT 66.0 08/02/2019   LDLCALC 99 06/14/2018   ALT 23 08/02/2019   AST 20 08/02/2019   NA 139 08/02/2019   K 4.3 08/02/2019   CL 105 08/02/2019   CREATININE 0.90 08/02/2019   BUN 13 08/02/2019   CO2 26 08/02/2019   TSH 5.38 (H) 06/03/2016   INR 0.96 05/28/2012   HGBA1C 7.0 (H) 02/07/2020   MICROALBUR 0.3  02/07/2020    DG Bone Density  Result Date: 02/21/2020 EXAM: DUAL X-RAY ABSORPTIOMETRY (DXA) FOR BONE MINERAL DENSITY IMPRESSION: Alferd Ap[facility] [place] [facility] Your patient Emagene Merfeld completed a BMD test on 02/21/2020 using the [place] (analysis version: 16.SP2) manufactured by [employer]. The following summarizes the results of our evaluation. [facility] PATIENT: Name: Eddye, Broxterman Patient ID: [id number] Birth Date: June 27, 1940 Height: 63.0 in. Gender: Female Measured: 02/21/2020 Weight: 167.2 lbs. Indications: Advanced Age, Caucasian, Diabetic, Estrogen Deficiency, History of Osteopenia, Post Menopausal Fractures: Treatments: Calcium, Fosamax(Alendronate), HRT, Metformin, Multivitamin, Vitamin D ASSESSMENT: The BMD measured at Forearm Radius 33% is 0.694 g/cm2 with a T-score of -2.1. This patient is considered osteopenic according to [facility]) criteria. Lumbar spine was not utilized due to advanced degenerative changes. Compared with the prior study on, 01/18/2018 the BMD of the total mean shows no statistically signficant change. The scan quality is good. The bilateral hips were not scanned due to hip replacements. FRAX cannot be performed due to total hip replacements bilaterally. Patient also is receiving bone building medication. Site Region Measured Date Measured Age WHO YA BMD Classification T-score Left Forearm Radius 33% 02/21/2020 79.0 Low Bone Mass -2.1 0.694 g/cm2 Left Forearm Radius 33% 01/18/2018 76.9 Low Bone Mass -2.0 0.704 g/cm2 World Health Organization [facility]) criteria for post-menopausal, Caucasian Women: Normal        T-score at or above -1 SD Low Bone Mass T-score between -1 and -2.5 SD Osteoporosis  T-score at or below -2.5 SD RECOMMENDATION:1. All patients should optimize calcium and vitamin D intake. 2. Consider FDA-approved medical therapies in postmenopausal women and men aged 74 years and older, based on the following: a. A hip or vertebral(clinical or  morphometric) fracture. b. T-Score < -2.5 at the femoral neck or spine after appropriate evaluation to exclude secondary causes c. Low bone mass (T-score between -1.0 and -2.5 at the femoral neck or spine) and a 10 year probability of a hip fracture >3% or a 10 year probability of major osteoporosis-related fracture > 20% based on the [place]-adapted WHO algorithm d. Clinical judgement and/or patient preferences may indicate  treatment for people with 10-year fracture probabilities above or below these levels FOLLOW-UP: Patients with diagnosis of osteoporosis or at high risk for fracture should have regular bone mineral density tests. For patients eligible for Medicare, routine testing is allowed once every 2 years. The testing frequency can be increased to one year for patients who have rapidly progressing disease, those who are receiving or discontinuing medical therapy to restore bone mass, or have additional risk factors. I have reviewed this report, and agree with the above findings. [facility] Electronically Signed   By: Lowella Grip III M.D.   On: 02/21/2020 08:58   MM 3D SCREEN BREAST BILATERAL  Result Date: 02/24/2020 CLINICAL DATA:  Screening. EXAM: DIGITAL SCREENING BILATERAL MAMMOGRAM WITH TOMO AND CAD COMPARISON:  Previous exam(s). ACR Breast Density Category b: There are scattered areas of fibroglandular density. FINDINGS: There are no findings suspicious for malignancy. Images were processed with CAD. IMPRESSION: No mammographic evidence of malignancy. A result letter of this screening mammogram will be mailed directly to the patient. RECOMMENDATION: Screening mammogram in one year. (Code:SM-B-01Y) BI-RADS CATEGORY  1: Negative. Electronically Signed   By: Ammie Ferrier M.D.   On: 02/24/2020 08:55     Assessment & Plan:  Plan  I have changed [place] cyclobenzaprine. I am also having her start on blood glucose meter kit and supplies. Additionally, I am having her maintain  her Vitamin D, Multiple Vitamins-Minerals (OCUVITE EXTRA PO), multivitamin with minerals, Trolamine Salicylate (ASPERCREME EX), Polyethyl Glycol-Propyl Glycol (SYSTANE OP), Calcium Citrate-Vitamin D (CALCIUM + D PO), acetaminophen, aspirin, polyethylene glycol, mupirocin ointment, Lidocaine, Fluticasone-Salmeterol, mirabegron ER, Fluticasone-Salmeterol, ezetimibe, ferrous sulfate, pregabalin, albuterol, Azelastine-Fluticasone, alendronate, metFORMIN, and celecoxib.  Meds ordered this encounter  Medications  . blood glucose meter kit and supplies KIT    Sig: Dispense based on patient and insurance preference. Use up to four times daily as directed.    Dispense:  1 each    Refill:  0    Order Specific Question:   Number of strips    Answer:   100    Order Specific Question:   Number of lancets    Answer:   100  . celecoxib (CELEBREX) 200 MG capsule    Sig: Take 1 capsule (200 mg total) by mouth daily.    Dispense:  30 capsule    Refill:  1  . cyclobenzaprine (FLEXERIL) 10 MG tablet    Sig: Take 1 tablet (10 mg total) by mouth 3 (three) times daily as needed. For Hip concerns    Dispense:  30 tablet    Refill:  2    Problem List Items Addressed This Visit      Unprioritized   Diabetes mellitus with peripheral vascular disease ([place])    hgba1c to be checked , minimize simple carbs. Increase exercise as tolerated. Continue current meds       Essential hypertension    Well controlled, no changes to meds. Encouraged heart healthy diet such as the DASH diet and exercise as tolerated.       HYPERLIPIDEMIA    Encouraged heart healthy diet, increase exercise, avoid trans fats, consider a krill oil cap daily      Hyperlipidemia associated with type 2 diabetes mellitus (HCC)   Relevant Orders   Lipid panel   Comprehensive metabolic panel   Low back pain   Relevant Medications   celecoxib (CELEBREX) 200 MG capsule   cyclobenzaprine (FLEXERIL) 10 MG tablet   Uncontrolled type 2 diabetes  mellitus with hyperglycemia ([place]) - Primary   Relevant Medications   blood glucose meter kit and supplies KIT   Other Relevant Orders   Lipid panel   Hemoglobin A1c   Comprehensive metabolic panel   Microalbumin / creatinine urine ratio    Other Visit Diagnoses    Primary hypertension       Relevant Orders   Lipid panel   Hemoglobin A1c   Comprehensive metabolic panel   Microalbumin / creatinine urine ratio      Follow-up: Return in about 6 months (around 02/11/2021), or if symptoms worsen or fail to improve, for annual exam, fasting.  [name], DO

## 2020-08-11 NOTE — Assessment & Plan Note (Signed)
hgba1c to be checked, minimize simple carbs. Increase exercise as tolerated. Continue current meds  

## 2020-08-11 NOTE — Patient Instructions (Signed)

## 2020-08-13 ENCOUNTER — Other Ambulatory Visit: Payer: Self-pay | Admitting: Internal Medicine

## 2020-08-17 ENCOUNTER — Other Ambulatory Visit: Payer: Self-pay

## 2020-08-17 ENCOUNTER — Telehealth: Payer: Self-pay | Admitting: Family Medicine

## 2020-08-17 DIAGNOSIS — E785 Hyperlipidemia, unspecified: Secondary | ICD-10-CM

## 2020-08-17 DIAGNOSIS — E1169 Type 2 diabetes mellitus with other specified complication: Secondary | ICD-10-CM

## 2020-08-17 DIAGNOSIS — E782 Mixed hyperlipidemia: Secondary | ICD-10-CM

## 2020-08-17 NOTE — Telephone Encounter (Signed)
Pt aware and voices understanding.   

## 2020-08-17 NOTE — Telephone Encounter (Signed)
Patient calling back in reference to lab results, please advise

## 2020-08-20 ENCOUNTER — Ambulatory Visit: Payer: Medicare Other | Admitting: Internal Medicine

## 2020-08-20 ENCOUNTER — Encounter: Payer: Self-pay | Admitting: Internal Medicine

## 2020-08-20 ENCOUNTER — Other Ambulatory Visit: Payer: Self-pay

## 2020-08-20 VITALS — BP 120/60 | HR 62 | Ht 63.5 in | Wt 164.4 lb

## 2020-08-20 DIAGNOSIS — I2510 Atherosclerotic heart disease of native coronary artery without angina pectoris: Secondary | ICD-10-CM

## 2020-08-20 DIAGNOSIS — E782 Mixed hyperlipidemia: Secondary | ICD-10-CM

## 2020-08-20 DIAGNOSIS — I779 Disorder of arteries and arterioles, unspecified: Secondary | ICD-10-CM

## 2020-08-20 DIAGNOSIS — E119 Type 2 diabetes mellitus without complications: Secondary | ICD-10-CM | POA: Diagnosis not present

## 2020-08-20 MED ORDER — ICOSAPENT ETHYL 1 G PO CAPS: 2.0000 g | ORAL_CAPSULE | Freq: Two times a day (BID) | ORAL | 11 refills | Status: DC

## 2020-08-20 NOTE — Patient Instructions (Signed)
Medication Instructions:  Dr. Debara Pickett has prescribed: Vascepa 2 capsules (2g) twice daily  Patient Assistance:  The [employer] foundation offers assistance to help pay for medication copays.  They will cover copays for all cholesterol lowering meds, including statins, fibrates, omega-3 oils, ezetimibe, Repatha, Praluent, Nexletol, Nexlizet.  The cards are usually good for $2,500 or 12 months, whichever comes first. 1. Go to healthwellfoundation.org 2. Click on "Apply Now" 3. Answer questions as to whom is applying (patient or representative) 4. Your disease fund will be "hypercholesterolemia - Medicare access" 5. They will ask questions about finances and which medications you are taking for cholesterol 6. When you submit, the approval is usually within minutes.  You will need to print the card information from the site 7. You will need to show this information to your pharmacy, they will bill your Medicare Part D plan first -then bill [employer] --for the copay.   You can also call them at [phone], although the hold times can be quite long.   Thank you for choosing [facility]   *If you need a refill on your cardiac medications before your next appointment, please call your pharmacy*   Lab Work: FASTING lab work in 3-4 months to check cholesterol   If you have labs (blood work) drawn today and your tests are completely normal, you will receive your results only by: [name] MyChart Message (if you have MyChart) OR . A paper copy in the mail If you have any lab test that is abnormal or we need to change your treatment, we will call you to review the results.   Testing/Procedures: NONE   Follow-Up: At [facility], you and your health needs are our priority.  As part of our continuing mission to provide you with exceptional heart care, we have created designated Provider Care Teams.  These Care Teams include your primary Cardiologist (physician) and Advanced Practice Providers (APPs  -  Physician Assistants and Nurse Practitioners) who all work together to provide you with the care you need, when you need it.  We recommend signing up for the patient portal called "MyChart".  Sign up information is provided on this After Visit Summary.  MyChart is used to connect with patients for Virtual Visits (Telemedicine).  Patients are able to view lab/test results, encounter notes, upcoming appointments, etc.  Non-urgent messages can be sent to your provider as well.   To learn more about what you can do with MyChart, go to [email or web address].    Your next appointment:   4 month(s) - lipid clinic  The format for your next appointment:   In Person or Virtual  Provider:   K. Mali Hilty, MD   Other Instructions

## 2020-08-20 NOTE — Progress Notes (Signed)
LIPID CLINIC CONSULT NOTE  Chief Complaint:  Manage dyslipidemia  Primary Care Physician: Carollee Herter, Alferd Apa, DO  Primary Cardiologist:  No primary care provider on file.  HPI:  [name] is a 80 y.o. female who is being seen today for the evaluation of dyslipidemia at the request of Carollee Herter, Alferd Apa, *. This is a 48-year-old female kindly referred for evaluation management of dyslipidemia.  She has a history of type 2 diabetes, dyslipidemia, mild carotid artery disease and coronary artery calcification seen previously on a CT scan.  She has a twin sister who apparently has coronary disease and has had 3 stents.  Has any history of prior cardiac events or any symptoms such as angina, dyspnea on exertion or decreased exercise tolerance.  Recently she has been residing at [facility].  She says that she eats "very well" there.  Her recent labs show an increase in triglycerides specifically that are quite high at 604, total cholesterol 222 and a direct LDL of 61.  Hemoglobin A1c is 7.1.  She unfortunately is intolerant of simvastatin which she took previously and caused her muscle weakness however at the time she had significant scoliosis and subsequently underwent spinal surgery.  She has not taken any additional statins.  She is currently on ezetimibe.  This is well-tolerated.  She has occasionally taken over-the-counter fish oil but not regularly.  Diet is variable but she does probably get a decent amount of sweets and baked goods.  PMHx:  Past Medical History:  Diagnosis Date  . Anemia   . Arthritis   . Asthma    adult onset  . Basal cell cancer    LUE; Porokeratosis also  . Chronic kidney disease 1963   strep in kidney due to strep throat-hospitalized 10 days  . Complication of anesthesia    small trachea  . Diabetes mellitus 2010   A1c 6.7%  . DVT (deep venous thrombosis) ([place]) 2006   post immobilization post cns surgery  . GERD (gastroesophageal reflux disease)     very mild  . Granulomatous lung disease ([place]) 2002   incidental Xray finding  . Hyperlipidemia   . Hypertension 2004   Hypertensive response on Stress Test  . Paralysis ([place]) 2006   post cervical fusion with spinal sac tear  with hematoma   . PTE (pulmonary thromboembolism) ([place]) 2006    Past Surgical History:  Procedure Laterality Date  . ABDOMINAL EXPOSURE N/A 08/12/2016   Procedure: ABDOMINAL EXPOSURE;  Surgeon: Rosetta Posner, MD;  Location: [facility];  Service: Vascular;  Laterality: N/A;  . ANTERIOR LAT LUMBAR FUSION N/A 08/12/2016   Procedure: LUMBAR TWO-THREE, LUMBAR THREE-FOUR, LUMBAR FOUR-FIVE  ANTEROLATERAL LUMBAR INTERBODY FUSION;  Surgeon: Erline Levine, MD;  Location: [facility];  Service: Neurosurgery;  Laterality: N/A;  L2-3 L3-4 L4-5 Anterolateral lumbar interbody fusion  . ANTERIOR LUMBAR FUSION N/A 08/12/2016   Procedure: Lumbar Five-Sacral One Anterior lumbar interbody fusion with Dr. Sherren Mocha Early to assist;  Surgeon: Erline Levine, MD;  Location: [facility];  Service: Neurosurgery;  Laterality: N/A;  L5-S1 Anterior lumbar interbody fusion with Dr. Sherren Mocha Early to assist  . BUNIONECTOMY    . CATARACT EXTRACTION Right 12/31/2018  . CATARACT EXTRACTION Left 12/17/2018  . CERVICAL FUSION  2006   Dr Annette Stable, NS;post op hematoma & cns leak & urinary retention  . COLONOSCOPY  1992 & 2002   negative  . epidural steroids  2006   cervical spine  . LUMBAR PERCUTANEOUS PEDICLE  SCREW 4 LEVEL N/A 08/12/2016   Procedure: LUMBAR TWO-SACRAL ONE Percuataneous Pedicle Screws;  Surgeon: Erline Levine, MD;  Location: [facility];  Service: Neurosurgery;  Laterality: N/A;  . ROTATOR CUFF REPAIR  2009   Right  . SEPTOPLASTY    . TONSILLECTOMY  80 years old  . TOTAL HIP ARTHROPLASTY  06/01/2012   Procedure: TOTAL HIP ARTHROPLASTY ANTERIOR APPROACH;  Surgeon: Mcarthur Rossetti, MD;  Location: [facility];  Service: Orthopedics;  Laterality: Left;  Left Total Hip Arthroplasty  . TOTAL HIP ARTHROPLASTY Right 02/23/2018    Procedure: RIGHT TOTAL HIP ARTHROPLASTY ANTERIOR APPROACH;  Surgeon: Mcarthur Rossetti, MD;  Location: [facility];  Service: Orthopedics;  Laterality: Right;  . TUBAL LIGATION      FAMHx:  Family History  Problem Relation Age of Onset  . COPD Father        emphysema  . Cancer Mother        cns cancer  . Diabetes Sister        TWIN sister ; also Fibromyalgia ; S/P stent 2004  . Heart disease Sister        stents @ 71 & 46  . Stroke Maternal Grandmother        in  late 17s  . Transient ischemic attack Paternal Aunt     SOCHx:   reports that she has never smoked. She has never used smokeless tobacco. She reports current alcohol use of about 1.0 standard drink of alcohol per week. She reports that she does not use drugs.  ALLERGIES:  Allergies  Allergen Reactions  . Statins Other (See Comments)    Myalgias and muscle weakness    ROS: Pertinent items noted in HPI and remainder of comprehensive ROS otherwise negative.  HOME MEDS: Current Outpatient Medications on File Prior to Visit  Medication Sig Dispense Refill  . acetaminophen (TYLENOL) 650 MG CR tablet Take 650-1,300 mg by mouth 2 (two) times daily as needed for pain.    [name] ADVAIR DISKUS 250-50 MCG/DOSE AEPB USE 1 INHALATION BY MOUTH  TWICE DAILY , RINSE MOUTH  AFTER USE 180 each 3  . albuterol (VENTOLIN HFA) 108 (90 Base) MCG/ACT inhaler Inhale 2 puffs every 6 hours if needed for breathing 25.5 g 4  . alendronate (FOSAMAX) 70 MG tablet Take 1 tablet (70 mg total) by mouth once a week. 12 tablet 3  . aspirin EC 81 MG tablet Take 81 mg by mouth daily.    . Azelastine-Fluticasone (DYMISTA) 137-50 MCG/ACT SUSP 1-2 puffs each nostril once or twice daily 23 g 12  . blood glucose meter kit and supplies KIT Dispense based on patient and insurance preference. Use up to four times daily as directed. 1 each 0  . Calcium Citrate-Vitamin D (CALCIUM + D PO) Take 1 tablet by mouth daily.    . celecoxib (CELEBREX) 200 MG capsule Take 1  capsule (200 mg total) by mouth daily. (Patient taking differently: Take 200 mg by mouth daily. Takes 200 MG Every Other Day.) 30 capsule 1  . Cholecalciferol (VITAMIN D) 50 MCG (2000 UT) CAPS Take 2,000 Units by mouth daily.     . cyclobenzaprine (FLEXERIL) 10 MG tablet Take 1 tablet (10 mg total) by mouth 3 (three) times daily as needed. For Hip concerns (Patient taking differently: Take 10 mg by mouth at bedtime. Takes 5 MG at Bedtime) 30 tablet 2  . ezetimibe (ZETIA) 10 MG tablet Take 1 tablet (10 mg total) by mouth daily. 90 tablet 1  .  ferrous sulfate (FEROSUL) 325 (65 FE) MG tablet TAKE 1 TABLET(325 MG) BY MOUTH DAILY WITH BREAKFAST 90 tablet 1  . Fluticasone-Salmeterol (ADVAIR DISKUS) 250-50 MCG/DOSE AEPB Inhale 1 puff into the lungs 2 (two) times daily. USE 1 INHALATION TWO TIMES  DAILY ; RINSE MOUTH 60 each 5  . Lidocaine 4 % PTCH Apply 1 patch topically as needed (back pain).    . metFORMIN (GLUCOPHAGE-XR) 500 MG 24 hr tablet Take 2 tablets by mouth daily 180 tablet 1  . mirabegron ER (MYRBETRIQ) 50 MG TB24 tablet TAKE 1 TABLET(50 MG) BY MOUTH DAILY (Patient taking differently: 50 mg as needed.) 30 tablet 5  . Multiple Vitamin (MULTIVITAMIN WITH MINERALS) TABS tablet Take 1 tablet by mouth daily.    . Multiple Vitamins-Minerals (OCUVITE EXTRA PO) Take 1 tablet by mouth daily.    . mupirocin ointment (BACTROBAN) 2 % Apply 1 application topically daily as needed (hip immflamation).     Vladimir Faster Glycol-Propyl Glycol (SYSTANE OP) Place 1 drop into both eyes daily as needed (dry eyes).    . polyethylene glycol (MIRALAX / GLYCOLAX) packet Take 17 g by mouth daily as needed for mild constipation.     . pregabalin (LYRICA) 100 MG capsule TAKE 1 CAPSULE(100 MG) BY MOUTH THREE TIMES DAILY 270 capsule 1  . Trolamine Salicylate (ASPERCREME EX) Apply 1 application topically daily as needed (back pain).     No current facility-administered medications on file prior to visit.    LABS/IMAGING: No  results found for this or any previous visit (from the past 48 hour(s)). No results found.  LIPID PANEL:    Component Value Date/Time   CHOL 222 (H) 08/11/2020 1059   TRIG (H) 08/11/2020 1059    604.0 Triglyceride is over 400; calculations on Lipids are invalid.   HDL 39.10 08/11/2020 1059   CHOLHDL 6 08/11/2020 1059   VLDL 61.0 (H) 01/28/2019 1425   LDLCALC 99 06/14/2018 1005   LDLDIRECT 61.0 08/11/2020 1059    WEIGHTS: Wt Readings from Last 3 Encounters:  08/20/20 164 lb 6.4 oz (74.6 kg)  08/11/20 164 lb 9.6 oz (74.7 kg)  05/26/20 165 lb 9.6 oz (75.1 kg)    VITALS: BP 120/60   Pulse 62   Ht 5' 3.5" (1.613 m)   Wt 164 lb 6.4 oz (74.6 kg)   SpO2 95%   BMI 28.67 kg/m   EXAM: General appearance: alert and no distress Neck: no carotid bruit, no JVD and thyroid not enlarged, symmetric, no tenderness/mass/nodules Lungs: clear to auscultation bilaterally Heart: regular rate and rhythm, S1, S2 normal, no murmur, click, rub or gallop Abdomen: soft, non-tender; bowel sounds normal; no masses,  no organomegaly Extremities: extremities normal, atraumatic, no cyanosis or edema Pulses: 2+ and symmetric Skin: Skin color, texture, turgor normal. No rashes or lesions Neurologic: Grossly normal Psych: Pleasant   *Examination chaperoned by Sheral Apley, RN.  EKG: Deferred  ASSESSMENT: 1. Mixed dyslipidemia with high triglycerides 2. Type 2 diabetes-A1c 7.1 3. Mild carotid artery disease 4. Coronary artery calcification on CT scan 5. Family history of coronary artery disease in her twin sister  PLAN: 1.   Ms. Henkes has a mixed dyslipidemia with high triglycerides.  She is on ezetimibe but cannot tolerate simvastatin although I would not necessarily consider her intolerant to all statins.  She has type 2 diabetes with mild PAD and coronary calcification.  Her target LDL is less than 70 and she is achieved that.  There is likely additional residual risk  with her high  triglycerides.  This is likely worsened by the fact that she is more sedentary recently and has gained some weight.  She is going to work diligently on diet and more physical activity but also think would benefit from adding Vascepa 2 g twice daily to her diet.  This is based on trial data from [facility] which did show benefits in patients with PAD as well as type 2 diabetes.  Plan follow-up with repeat lipids in 3 months.  Thanks again for the kind referral.  Pixie Casino, MD, FACC, [place] Director of the [facility] Diplomate of the American Board of Clinical Lipidology Attending Cardiologist  Direct Dial: [phone]  Fax: [phone]  Website:  [email or web address] 08/20/2020, 2:02 PM

## 2020-08-25 ENCOUNTER — Telehealth: Payer: Self-pay | Admitting: Internal Medicine

## 2020-08-25 NOTE — Telephone Encounter (Signed)
Patient approved for [employer] grant from 07/25/20 - 07/04/21 ID: [id number]  Pharmacy Card ID: [id number] PC Group: [id number] PC PCN: [id number] PC PCN: [id number] PC Processor: PDMI

## 2020-09-04 ENCOUNTER — Other Ambulatory Visit: Payer: Self-pay | Admitting: Family Medicine

## 2020-09-04 DIAGNOSIS — D509 Iron deficiency anemia, unspecified: Secondary | ICD-10-CM

## 2020-09-08 ENCOUNTER — Telehealth: Payer: Self-pay | Admitting: Family Medicine

## 2020-09-08 NOTE — Telephone Encounter (Addendum)
Caller Luberto,Daina Caller # [phone]   Pt received her blood glucose meter kit and supplies KIT [[id number] however she needs more supplies like lancets and test srtip sent   [facility] [address], [address], [place] [address]  Phone:  [phone] Fax:  [phone]

## 2020-09-15 NOTE — Telephone Encounter (Signed)
Called pt and left Vm to return call to office. -Jma

## 2020-09-16 ENCOUNTER — Telehealth: Payer: Self-pay | Admitting: Family Medicine

## 2020-09-16 NOTE — Telephone Encounter (Signed)
Caller Kays,Polinsky  Caller # [phone]   Patient states she needs the test strips and Lance for the her Glucose meter just the supplies  Please advice

## 2020-09-17 ENCOUNTER — Other Ambulatory Visit: Payer: Self-pay

## 2020-09-17 DIAGNOSIS — E1165 Type 2 diabetes mellitus with hyperglycemia: Secondary | ICD-10-CM

## 2020-09-17 MED ORDER — LANCETS MISC: 1.0000 | Freq: Two times a day (BID) | 0 refills | Status: DC

## 2020-09-17 MED ORDER — GLUCOSE BLOOD VI STRP: ORAL_STRIP | 12 refills | Status: DC

## 2020-09-17 NOTE — Telephone Encounter (Signed)
Supplies refilled.

## 2020-09-23 ENCOUNTER — Other Ambulatory Visit: Payer: Self-pay

## 2020-09-23 DIAGNOSIS — N3281 Overactive bladder: Secondary | ICD-10-CM

## 2020-09-23 MED ORDER — MIRABEGRON ER 50 MG PO TB24: ORAL_TABLET | ORAL | 0 refills | Status: DC

## 2020-09-24 DIAGNOSIS — M5416 Radiculopathy, lumbar region: Secondary | ICD-10-CM | POA: Diagnosis not present

## 2020-10-27 DIAGNOSIS — M48062 Spinal stenosis, lumbar region with neurogenic claudication: Secondary | ICD-10-CM | POA: Diagnosis not present

## 2020-10-27 DIAGNOSIS — M5416 Radiculopathy, lumbar region: Secondary | ICD-10-CM | POA: Diagnosis not present

## 2020-11-05 ENCOUNTER — Other Ambulatory Visit: Payer: Self-pay | Admitting: Family Medicine

## 2020-11-05 DIAGNOSIS — E1140 Type 2 diabetes mellitus with diabetic neuropathy, unspecified: Secondary | ICD-10-CM

## 2020-11-05 NOTE — Telephone Encounter (Signed)
Requesting: [place] Contract: 2019 UDS: 2019 Last OV: 08/11/2020 Next OV: 02/18/21 Last Refill: 05/07/20, #[id number] RF Database:   Please advise

## 2020-11-12 ENCOUNTER — Encounter: Payer: Self-pay | Admitting: Family Medicine

## 2020-11-12 ENCOUNTER — Other Ambulatory Visit: Payer: Self-pay

## 2020-11-12 ENCOUNTER — Ambulatory Visit (INDEPENDENT_AMBULATORY_CARE_PROVIDER_SITE_OTHER): Payer: Medicare Other | Admitting: Family Medicine

## 2020-11-12 ENCOUNTER — Ambulatory Visit (HOSPITAL_BASED_OUTPATIENT_CLINIC_OR_DEPARTMENT_OTHER)
Admission: RE | Admit: 2020-11-12 | Discharge: 2020-11-12 | Disposition: A | Discharge status: Home / Self Care | Payer: Medicare Other | Source: Ambulatory Visit | Attending: Family Medicine | Admitting: Family Medicine

## 2020-11-12 VITALS — BP 118/68 | HR 66 | Temp 97.6°F | Resp 18 | Ht 63.5 in | Wt 164.2 lb

## 2020-11-12 DIAGNOSIS — M25552 Pain in left hip: Secondary | ICD-10-CM

## 2020-11-12 DIAGNOSIS — M545 Low back pain, unspecified: Secondary | ICD-10-CM | POA: Insufficient documentation

## 2020-11-12 DIAGNOSIS — G8929 Other chronic pain: Secondary | ICD-10-CM

## 2020-11-12 MED ORDER — MUPIROCIN 2 % EX OINT: 1.0000 "application " | TOPICAL_OINTMENT | Freq: Three times a day (TID) | CUTANEOUS | 3 refills | Status: DC

## 2020-11-12 NOTE — Patient Instructions (Signed)
Hip Pain The hip is the joint between the upper legs and the lower pelvis. The bones, cartilage, tendons, and muscles of your hip joint support your body and allow you to move around. Hip pain can range from a minor ache to severe pain in one or both of your hips. The pain may be felt on the inside of the hip joint near the groin, or on the outside near the buttocks and upper thigh. You may also have swelling or stiffness in your hip area. Follow these instructions at home: Managing pain, stiffness, and swelling   If directed, put ice on the painful area. To do this: Put ice in a plastic bag. Place a towel between your skin and the bag. Leave the ice on for 20 minutes, 2-3 times a day. If directed, apply heat to the affected area as often as told by your health care provider. Use the heat source that your health care provider recommends, such as a moist heat pack or a heating pad. Place a towel between your skin and the heat source. Leave the heat on for 20-30 minutes. Remove the heat if your skin turns bright red. This is especially important if you are unable to feel pain, heat, or cold. You may have a greater risk of getting burned. Activity Do exercises as told by your health care provider. Avoid activities that cause pain. General instructions  Take over-the-counter and prescription medicines only as told by your health care provider. Keep a journal of your symptoms. Write down: How often you have hip pain. The location of your pain. What the pain feels like. What makes the pain worse. Sleep with a pillow between your legs on your most comfortable side. Keep all follow-up visits as told by your health care provider. This is important. Contact a health care provider if: You cannot put weight on your leg. Your pain or swelling continues or gets worse after one week. It gets harder to walk. You have a fever. Get help right away if: You fall. You have a sudden increase in pain and  swelling in your hip. Your hip is red or swollen or very tender to touch. Summary Hip pain can range from a minor ache to severe pain in one or both of your hips. The pain may be felt on the inside of the hip joint near the groin, or on the outside near the buttocks and upper thigh. Avoid activities that cause pain. Write down how often you have hip pain, the location of the pain, what makes it worse, and what it feels like. This information is not intended to replace advice given to you by your health care provider. Make sure you discuss any questions you have with your health care provider. Document Revised: 10/01/2018 Document Reviewed: 10/01/2018 Elsevier Patient Education  2022 [employer]  

## 2020-11-12 NOTE — Progress Notes (Signed)
Subjective:   By signing my name below, I, Shehryar Baig, attest that this documentation has been prepared under the direction and in the presence of Dr. [name], DO. 11/12/2020    Patient ID: [name], female    DOB: 1940-10-27, 80 y.o.   MRN: [id number]  Chief Complaint  Patient presents with  . Hip Pain    Left pain, 1 month. Pt states no rash just pain     HPI Patient is in today for a office visit. She complains of pain in her left ankle where she had a biopsy of a precancer. She also notes that her skin tears easily and she bleeds easily. She is requesting a refill of Bactroban to manage these symptoms.  She also complains of pain in her left hip and back for the past month. She gets injections to manage the pain but only finds mild relief. Her physician Dr. Davy Pique manages her injections for her piriformis pain. She reports it has been a while since she was last evaluated for her hip pain. The pain does not radiate down her left leg. She takes 10 mg cyclobenzaprine 3x daily PO as needed to manage her pain. She has a past history of cellulitis.   Past Medical History:  Diagnosis Date  . Anemia   . Arthritis   . Asthma    adult onset  . Basal cell cancer    LUE; Porokeratosis also  . Chronic kidney disease 1963   strep in kidney due to strep throat-hospitalized 10 days  . Complication of anesthesia    small trachea  . Diabetes mellitus 2010   A1c 6.7%  . DVT (deep venous thrombosis) ([place]) 2006   post immobilization post cns surgery  . GERD (gastroesophageal reflux disease)    very mild  . Granulomatous lung disease ([place]) 2002   incidental Xray finding  . Hyperlipidemia   . Hypertension 2004   Hypertensive response on Stress Test  . Paralysis ([place]) 2006   post cervical fusion with spinal sac tear  with hematoma   . PTE (pulmonary thromboembolism) ([place]) 2006    Past Surgical History:  Procedure Laterality Date  . ABDOMINAL EXPOSURE N/A 08/12/2016    Procedure: ABDOMINAL EXPOSURE;  Surgeon: Rosetta Posner, MD;  Location: [facility];  Service: Vascular;  Laterality: N/A;  . ANTERIOR LAT LUMBAR FUSION N/A 08/12/2016   Procedure: LUMBAR TWO-THREE, LUMBAR THREE-FOUR, LUMBAR FOUR-FIVE  ANTEROLATERAL LUMBAR INTERBODY FUSION;  Surgeon: Erline Levine, MD;  Location: [facility];  Service: Neurosurgery;  Laterality: N/A;  L2-3 L3-4 L4-5 Anterolateral lumbar interbody fusion  . ANTERIOR LUMBAR FUSION N/A 08/12/2016   Procedure: Lumbar Five-Sacral One Anterior lumbar interbody fusion with Dr. Sherren Mocha Early to assist;  Surgeon: Erline Levine, MD;  Location: [facility];  Service: Neurosurgery;  Laterality: N/A;  L5-S1 Anterior lumbar interbody fusion with Dr. Sherren Mocha Early to assist  . BUNIONECTOMY    . CATARACT EXTRACTION Right 12/31/2018  . CATARACT EXTRACTION Left 12/17/2018  . CERVICAL FUSION  2006   Dr Annette Stable, NS;post op hematoma & cns leak & urinary retention  . COLONOSCOPY  1992 & 2002   negative  . epidural steroids  2006   cervical spine  . LUMBAR PERCUTANEOUS PEDICLE SCREW 4 LEVEL N/A 08/12/2016   Procedure: LUMBAR TWO-SACRAL ONE Percuataneous Pedicle Screws;  Surgeon: Erline Levine, MD;  Location: [facility];  Service: Neurosurgery;  Laterality: N/A;  . ROTATOR CUFF REPAIR  2009   Right  . SEPTOPLASTY    .  TONSILLECTOMY  80 years old  . TOTAL HIP ARTHROPLASTY  06/01/2012   Procedure: TOTAL HIP ARTHROPLASTY ANTERIOR APPROACH;  Surgeon: Christopher Y Blackman, MD;  Location: [facility];  Service: Orthopedics;  Laterality: Left;  Left Total Hip Arthroplasty  . TOTAL HIP ARTHROPLASTY Right 02/23/2018   Procedure: RIGHT TOTAL HIP ARTHROPLASTY ANTERIOR APPROACH;  Surgeon: Blackman, Christopher Y, MD;  Location: [facility];  Service: Orthopedics;  Laterality: Right;  . TUBAL LIGATION      Family History  Problem Relation Age of Onset  . COPD Father        emphysema  . Cancer Mother        cns cancer  . Diabetes Sister        TWIN sister ; also Fibromyalgia ; S/P stent 2004  . Heart  disease Sister        stents @ 60 & 68  . Stroke Maternal Grandmother        in  late 50s  . Transient ischemic attack Paternal Aunt     Social History   Socioeconomic History  . Marital status: Divorced    Spouse name: Not on file  . Number of children: Not on file  . Years of education: Not on file  . Highest education level: Not on file  Occupational History  . Not on file  Tobacco Use  . Smoking status: Never  . Smokeless tobacco: Never  Vaping Use  . Vaping Use: Never used  Substance and Sexual Activity  . Alcohol use: Yes    Alcohol/week: 1.0 standard drink    Types: 1 Glasses of wine per week    Comment: socially  . Drug use: No  . Sexual activity: Never  Other Topics Concern  . Not on file  Social History Narrative  . Not on file   Social Determinants of Health   Financial Resource Strain: Not on file  Food Insecurity: Not on file  Transportation Needs: Not on file  Physical Activity: Not on file  Stress: Not on file  Social Connections: Moderately Integrated  . Frequency of Communication with Friends and Family: Three times a week  . Frequency of Social Gatherings with Friends and Family: Once a week  . Attends Religious Services: More than 4 times per year  . Active Member of Clubs or Organizations: Yes  . Attends Club or Organization Meetings: More than 4 times per year  . Marital Status: Divorced  Intimate Partner Violence: Not on file    Outpatient Medications Prior to Visit  Medication Sig Dispense Refill  . acetaminophen (TYLENOL) 650 MG CR tablet Take 650-1,300 mg by mouth 2 (two) times daily as needed for pain.    . ADVAIR DISKUS 250-50 MCG/DOSE AEPB USE 1 INHALATION BY MOUTH  TWICE DAILY , RINSE MOUTH  AFTER USE 180 each 3  . albuterol (VENTOLIN HFA) 108 (90 Base) MCG/ACT inhaler Inhale 2 puffs every 6 hours if needed for breathing 25.5 g 4  . alendronate (FOSAMAX) 70 MG tablet Take 1 tablet (70 mg total) by mouth once a week. 12 tablet 3  .  Azelastine-Fluticasone (DYMISTA) 137-50 MCG/ACT SUSP 1-2 puffs each nostril once or twice daily 23 g 12  . blood glucose meter kit and supplies KIT Dispense based on patient and insurance preference. Use up to four times daily as directed. 1 each 0  . Calcium Citrate-Vitamin D (CALCIUM + D PO) Take 1 tablet by mouth daily.    . celecoxib (CELEBREX) 200 MG capsule   Take 1 capsule (200 mg total) by mouth daily. (Patient taking differently: Take 200 mg by mouth daily. Takes 200 MG Every Other Day.) 30 capsule 1  . Cholecalciferol (VITAMIN D) 50 MCG (2000 UT) CAPS Take 2,000 Units by mouth daily.     . cyclobenzaprine (FLEXERIL) 10 MG tablet Take 1 tablet (10 mg total) by mouth 3 (three) times daily as needed. For Hip concerns (Patient taking differently: Take 10 mg by mouth at bedtime. Takes 5 MG at Bedtime) 30 tablet 2  . ezetimibe (ZETIA) 10 MG tablet Take 1 tablet (10 mg total) by mouth daily. 90 tablet 1  . FEROSUL 325 (65 Fe) MG tablet TAKE 1 TABLET(325 MG) BY MOUTH DAILY WITH BREAKFAST 90 tablet 1  . Fluticasone-Salmeterol (ADVAIR DISKUS) 250-50 MCG/DOSE AEPB Inhale 1 puff into the lungs 2 (two) times daily. USE 1 INHALATION TWO TIMES  DAILY ; RINSE MOUTH 60 each 5  . glucose blood test strip Use as instructed 100 each 12  . Lancets MISC 1 each by Does not apply route 2 (two) times daily. 100 each 0  . Lidocaine 4 % PTCH Apply 1 patch topically as needed (back pain).    . metFORMIN (GLUCOPHAGE-XR) 500 MG 24 hr tablet Take 2 tablets by mouth daily 180 tablet 1  . mirabegron ER (MYRBETRIQ) 50 MG TB24 tablet TAKE 1 TABLET(50 MG) BY MOUTH DAILY 90 tablet 0  . Multiple Vitamin (MULTIVITAMIN WITH MINERALS) TABS tablet Take 1 tablet by mouth daily.    . Multiple Vitamins-Minerals (OCUVITE EXTRA PO) Take 1 tablet by mouth daily.    Vladimir Faster Glycol-Propyl Glycol (SYSTANE OP) Place 1 drop into both eyes daily as needed (dry eyes).    . polyethylene glycol (MIRALAX / GLYCOLAX) packet Take 17 g by mouth  daily as needed for mild constipation.     . pregabalin (LYRICA) 100 MG capsule TAKE 1 CAPSULE(100 MG) BY MOUTH THREE TIMES DAILY 270 capsule 1  . Trolamine Salicylate (ASPERCREME EX) Apply 1 application topically daily as needed (back pain).    . mupirocin ointment (BACTROBAN) 2 % Apply 1 application topically daily as needed (hip immflamation).     [name] aspirin EC 81 MG tablet Take 81 mg by mouth daily. (Patient not taking: Reported on 11/12/2020)    . icosapent Ethyl (VASCEPA) 1 g capsule Take 2 capsules (2 g total) by mouth 2 (two) times daily. (Patient not taking: Reported on 11/12/2020) 120 capsule 11   No facility-administered medications prior to visit.    Allergies  Allergen Reactions  . Statins Other (See Comments)    Myalgias and muscle weakness    Review of Systems  Constitutional:  Negative for fever and malaise/fatigue.  HENT:  Negative for congestion.   Eyes:  Negative for blurred vision.  Respiratory:  Negative for shortness of breath.   Cardiovascular:  Negative for chest pain, palpitations and leg swelling.  Gastrointestinal:  Negative for abdominal pain, blood in stool and nausea.  Genitourinary:  Negative for dysuria and frequency.  Musculoskeletal:  Positive for back pain (Left lower back), joint pain (Left hip) and myalgias (Left ankle). Negative for falls.  Skin:  Negative for rash.  Neurological:  Negative for dizziness, loss of consciousness and headaches.  Endo/Heme/Allergies:  Negative for environmental allergies. Bruises/bleeds easily.  Psychiatric/Behavioral:  Negative for depression. The patient is not nervous/anxious.       Objective:    Physical Exam Vitals and nursing note reviewed.  Constitutional:      General:  She is not in acute distress.    Appearance: Normal appearance. She is not ill-appearing.  HENT:     Head: Normocephalic and atraumatic.     Right Ear: External ear normal.     Left Ear: External ear normal.  Eyes:     Extraocular  Movements: Extraocular movements intact.     Pupils: Pupils are equal, round, and reactive to light.  Cardiovascular:     Rate and Rhythm: Normal rate and regular rhythm.     Pulses: Normal pulses.     Heart sounds: Normal heart sounds. No murmur heard.   No gallop.  Pulmonary:     Effort: Pulmonary effort is normal. No respiratory distress.     Breath sounds: Normal breath sounds. No wheezing, rhonchi or rales.  Musculoskeletal:        General: Tenderness present.     Right lower leg: No edema.     Left lower leg: No edema.     Comments: 5/5 strength in lower extremities. Full range of motion on lower extremities with pain.  Skin:    General: Skin is warm and dry.     Findings: No erythema, lesion or rash.  Neurological:     Mental Status: She is alert and oriented to person, place, and time.     Deep Tendon Reflexes:     Reflex Scores:      Patellar reflexes are 2+ on the right side and 2+ on the left side. Psychiatric:        Behavior: Behavior normal.    BP 118/68 (BP Location: Right Arm, Patient Position: Sitting, Cuff Size: Normal)   Pulse 66   Temp 97.6 F (36.4 C) (Oral)   Resp 18   Ht 5' 3.5" (1.613 m)   Wt 164 lb 3.2 oz (74.5 kg)   SpO2 96%   BMI 28.63 kg/m  Wt Readings from Last 3 Encounters:  11/12/20 164 lb 3.2 oz (74.5 kg)  08/20/20 164 lb 6.4 oz (74.6 kg)  08/11/20 164 lb 9.6 oz (74.7 kg)    Diabetic Foot Exam - Simple   No data filed    Lab Results  Component Value Date   WBC 8.6 01/28/2019   HGB 12.4 01/28/2019   HCT 37.5 01/28/2019   PLT 218.0 01/28/2019   GLUCOSE 138 (H) 08/11/2020   CHOL 222 (H) 08/11/2020   TRIG (H) 08/11/2020    604.0 Triglyceride is over 400; calculations on Lipids are invalid.   HDL 39.10 08/11/2020   LDLDIRECT 61.0 08/11/2020   LDLCALC 99 06/14/2018   ALT 22 08/11/2020   AST 18 08/11/2020   NA 138 08/11/2020   K 4.1 08/11/2020   CL 104 08/11/2020   CREATININE 0.94 08/11/2020   BUN 19 08/11/2020   CO2 27  08/11/2020   TSH 5.38 (H) 06/03/2016   INR 0.96 05/28/2012   HGBA1C 7.1 (H) 08/11/2020   MICROALBUR <0.7 08/11/2020    Lab Results  Component Value Date   TSH 5.38 (H) 06/03/2016   Lab Results  Component Value Date   WBC 8.6 01/28/2019   HGB 12.4 01/28/2019   HCT 37.5 01/28/2019   MCV 87.1 01/28/2019   PLT 218.0 01/28/2019   Lab Results  Component Value Date   NA 138 08/11/2020   K 4.1 08/11/2020   CO2 27 08/11/2020   GLUCOSE 138 (H) 08/11/2020   BUN 19 08/11/2020   CREATININE 0.94 08/11/2020   BILITOT 0.4 08/11/2020   ALKPHOS 66 08/11/2020  AST 18 08/11/2020   ALT 22 08/11/2020   PROT 6.4 08/11/2020   ALBUMIN 3.7 08/11/2020   CALCIUM 9.2 08/11/2020   ANIONGAP 7 07/07/2018   GFR 57.70 (L) 08/11/2020   Lab Results  Component Value Date   CHOL 222 (H) 08/11/2020   Lab Results  Component Value Date   HDL 39.10 08/11/2020   Lab Results  Component Value Date   LDLCALC 99 06/14/2018   Lab Results  Component Value Date   TRIG (H) 08/11/2020    604.0 Triglyceride is over 400; calculations on Lipids are invalid.   Lab Results  Component Value Date   CHOLHDL 6 08/11/2020   Lab Results  Component Value Date   HGBA1C 7.1 (H) 08/11/2020       Assessment & Plan:   Problem List Items Addressed This Visit       Unprioritized   Left hip pain - Primary    Check xray F/u ortho        Relevant Orders   DG Hip Unilat W OR W/O Pelvis 2-3 Views Left   DG Lumbar Spine Complete   Low back pain    Check xray  F/u neuro surgery        Relevant Orders   DG Lumbar Spine Complete     Meds ordered this encounter  Medications  . mupirocin ointment (BACTROBAN) 2 %    Sig: Apply 1 application topically 3 (three) times daily.    Dispense:  30 g    Refill:  3    I, Dr. [name], DO, personally preformed the services described in this documentation.  All medical record entries made by the scribe were at my direction and in my presence.  I have  reviewed the chart and discharge instructions (if applicable) and agree that the record reflects my personal performance and is accurate and complete. 11/12/2020   I,Shehryar Baig,acting as a Education administrator for [employer], DO.,have documented all relevant documentation on the behalf of [name], DO,as directed by  [name], DO while in the presence of [name], DO.   [name], DO

## 2020-11-12 NOTE — Assessment & Plan Note (Signed)
Check xray F/u ortho

## 2020-11-12 NOTE — Assessment & Plan Note (Signed)
Check xray  F/u neuro surgery

## 2020-11-24 ENCOUNTER — Other Ambulatory Visit: Payer: Self-pay | Admitting: Family Medicine

## 2020-11-24 DIAGNOSIS — N3281 Overactive bladder: Secondary | ICD-10-CM

## 2020-12-04 ENCOUNTER — Ambulatory Visit: Payer: Medicare Other | Attending: Internal Medicine

## 2020-12-04 ENCOUNTER — Other Ambulatory Visit (HOSPITAL_BASED_OUTPATIENT_CLINIC_OR_DEPARTMENT_OTHER): Payer: Self-pay

## 2020-12-04 ENCOUNTER — Other Ambulatory Visit: Payer: Self-pay

## 2020-12-04 DIAGNOSIS — Z23 Encounter for immunization: Secondary | ICD-10-CM

## 2020-12-04 MED ORDER — COVID-19 MRNA VACC (MODERNA) 100 MCG/0.5ML IM SUSP
INTRAMUSCULAR | 0 refills | Status: DC
  Filled 2020-12-04: qty 0.3, 1d supply, fill #0

## 2020-12-17 ENCOUNTER — Telehealth: Payer: Self-pay | Admitting: Pharmacist

## 2020-12-17 NOTE — Chronic Care Management (AMB) (Addendum)
Chronic Care Management Pharmacy Assistant   Name: [name]  MRN: [id number] DOB: 08-28-1940  Reason for Encounter: General Adherence Call  Recent office visits:  11/12/20- [name], DO- seen for left hip pain, increased mupirocin ointment use from prn to three times daily, x rays ordered, follow up 3 months   08/11/20- [name], DO- seen for uncontrolled type 2 diabetes, started celecoxib 200 mg daily, started cyclobenzaprine 10 mg three times daily prn, labs ordered, started glucose monitoring, follow up 6 months   Recent consult visits:  08/20/20- [name], MD (Cardiology)- seen for evaluation of dyslipidemia, started icosapent ethyl 2 g twice daily, follow up 3 months   Hospital visits:  None in previous 6 months  Medications: Outpatient Encounter Medications as of 12/17/2020  Medication Sig   acetaminophen (TYLENOL) 650 MG CR tablet Take 650-1,300 mg by mouth 2 (two) times daily as needed for pain.   ADVAIR DISKUS 250-50 MCG/DOSE AEPB USE 1 INHALATION BY MOUTH  TWICE DAILY , RINSE MOUTH  AFTER USE   albuterol (VENTOLIN HFA) 108 (90 Base) MCG/ACT inhaler Inhale 2 puffs every 6 hours if needed for breathing   alendronate (FOSAMAX) 70 MG tablet Take 1 tablet (70 mg total) by mouth once a week.   aspirin EC 81 MG tablet Take 81 mg by mouth daily. (Patient not taking: Reported on 11/12/2020)   Azelastine-Fluticasone (DYMISTA) 137-50 MCG/ACT SUSP 1-2 puffs each nostril once or twice daily   blood glucose meter kit and supplies KIT Dispense based on patient and insurance preference. Use up to four times daily as directed.   Calcium Citrate-Vitamin D (CALCIUM + D PO) Take 1 tablet by mouth daily.   celecoxib (CELEBREX) 200 MG capsule Take 1 capsule (200 mg total) by mouth daily. (Patient taking differently: Take 200 mg by mouth daily. Takes 200 MG Every Other Day.)   Cholecalciferol (VITAMIN D) 50 MCG (2000 UT) CAPS Take 2,000 Units by mouth daily.    COVID-19  mRNA vaccine, Moderna, 100 MCG/0.5ML injection Inject into the muscle.   cyclobenzaprine (FLEXERIL) 10 MG tablet Take 1 tablet (10 mg total) by mouth 3 (three) times daily as needed. For Hip concerns (Patient taking differently: Take 10 mg by mouth at bedtime. Takes 5 MG at Bedtime)   ezetimibe (ZETIA) 10 MG tablet Take 1 tablet (10 mg total) by mouth daily.   FEROSUL 325 (65 Fe) MG tablet TAKE 1 TABLET(325 MG) BY MOUTH DAILY WITH BREAKFAST   Fluticasone-Salmeterol (ADVAIR DISKUS) 250-50 MCG/DOSE AEPB Inhale 1 puff into the lungs 2 (two) times daily. USE 1 INHALATION TWO TIMES  DAILY ; RINSE MOUTH   glucose blood test strip Use as instructed   icosapent Ethyl (VASCEPA) 1 g capsule Take 2 capsules (2 g total) by mouth 2 (two) times daily. (Patient not taking: Reported on 11/12/2020)   Lancets MISC 1 each by Does not apply route 2 (two) times daily.   Lidocaine 4 % PTCH Apply 1 patch topically as needed (back pain).   metFORMIN (GLUCOPHAGE-XR) 500 MG 24 hr tablet Take 2 tablets by mouth daily   Multiple Vitamin (MULTIVITAMIN WITH MINERALS) TABS tablet Take 1 tablet by mouth daily.   Multiple Vitamins-Minerals (OCUVITE EXTRA PO) Take 1 tablet by mouth daily.   mupirocin ointment (BACTROBAN) 2 % Apply 1 application topically 3 (three) times daily.   MYRBETRIQ 50 MG TB24 tablet TAKE 1 TABLET BY MOUTH  DAILY   Polyethyl Glycol-Propyl Glycol (SYSTANE OP) Place 1  drop into both eyes daily as needed (dry eyes).   polyethylene glycol (MIRALAX / GLYCOLAX) packet Take 17 g by mouth daily as needed for mild constipation.    pregabalin (LYRICA) 100 MG capsule TAKE 1 CAPSULE(100 MG) BY MOUTH THREE TIMES DAILY   Trolamine Salicylate (ASPERCREME EX) Apply 1 application topically daily as needed (back pain).   No facility-administered encounter medications on file as of 12/17/2020.   I spoke with [name] this morning and she is doing very well. She lives in an assisted living apartment and has been enjoying it  for the past 3 years. She is very happy with the changes that they are making and is excited about the new chef. She attends many of the classes there and has seen a great improvement with her mobility. After having spine surgery in 2018 and hip surgery in 2019 she has fully regained her ability to walk. She recently drove 2 hrs to see her daughter and will also be going to [place] with her to celebrate an early birthday. Overall she is doing well and has no complaints. She will have a visit with Cardiology 08/16 and will be reviewing taking Ezetimibe with icosapent then as she has been tracking her BS and they have normalized.   Have you had any problems recently with your health? Patient stated she has not had any recent problems with her health   Have you had any problems with your pharmacy? Patient stated she has not had any problems with her pharmacy   What issues or side effects are you having with your medications? Patient stated she isn't having any side effects from medications   What would you like me to pass along to [facility] for them to help you with?  Patient stated she had nothing to pass along   What can we do to take care of you better? Patient did not offer any suggestions   Star Rating Drugs: Metformin 500 mg- 90 DS last filled 10/13/20  Wilford Sports CPA, CMA

## 2020-12-18 NOTE — Telephone Encounter (Cosign Needed)
CCM follow up phone visit scheduled for 10/13 at 9 am

## 2020-12-28 DIAGNOSIS — M5416 Radiculopathy, lumbar region: Secondary | ICD-10-CM | POA: Diagnosis not present

## 2021-01-05 DIAGNOSIS — E782 Mixed hyperlipidemia: Secondary | ICD-10-CM | POA: Diagnosis not present

## 2021-01-05 LAB — LIPID PANEL
Chol/HDL Ratio: 5.3 ratio — ABNORMAL HIGH (ref 0.0–4.4)
Cholesterol, Total: 222 mg/dL — ABNORMAL HIGH (ref 100–199)
HDL: 42 mg/dL (ref >39)
LDL Chol Calc (NIH): 121 mg/dL — ABNORMAL HIGH (ref 0–99)
Triglycerides: 337 mg/dL — ABNORMAL HIGH (ref 0–149)
VLDL Cholesterol Cal: 59 mg/dL — ABNORMAL HIGH (ref 5–40)

## 2021-01-05 LAB — LDL CHOLESTEROL, DIRECT: LDL Direct: 99 mg/dL (ref 0–99)

## 2021-01-06 ENCOUNTER — Ambulatory Visit: Payer: Medicare Other | Admitting: Internal Medicine

## 2021-01-12 ENCOUNTER — Other Ambulatory Visit: Payer: Self-pay | Admitting: Family Medicine

## 2021-01-12 ENCOUNTER — Ambulatory Visit: Payer: Medicare Other | Admitting: Internal Medicine

## 2021-01-12 ENCOUNTER — Encounter: Payer: Self-pay | Admitting: Internal Medicine

## 2021-01-12 ENCOUNTER — Other Ambulatory Visit: Payer: Self-pay

## 2021-01-12 VITALS — BP 130/70 | HR 68 | Ht 63.5 in | Wt 163.0 lb

## 2021-01-12 DIAGNOSIS — E119 Type 2 diabetes mellitus without complications: Secondary | ICD-10-CM

## 2021-01-12 DIAGNOSIS — E1165 Type 2 diabetes mellitus with hyperglycemia: Secondary | ICD-10-CM

## 2021-01-12 DIAGNOSIS — E1169 Type 2 diabetes mellitus with other specified complication: Secondary | ICD-10-CM

## 2021-01-12 DIAGNOSIS — I2510 Atherosclerotic heart disease of native coronary artery without angina pectoris: Secondary | ICD-10-CM | POA: Diagnosis not present

## 2021-01-12 DIAGNOSIS — I779 Disorder of arteries and arterioles, unspecified: Secondary | ICD-10-CM

## 2021-01-12 DIAGNOSIS — E782 Mixed hyperlipidemia: Secondary | ICD-10-CM

## 2021-01-12 DIAGNOSIS — E785 Hyperlipidemia, unspecified: Secondary | ICD-10-CM

## 2021-01-12 NOTE — Progress Notes (Signed)
For   LIPID CLINIC CONSULT NOTE  Chief Complaint:  Follow-up dyslipidemia  Primary Care Physician: [name], [name] Apa, DO  Primary Cardiologist:  None  HPI:  [name] is a 80 y.o. female who is being seen today for the evaluation of dyslipidemia at the request of [name], *. This is a 70-year-old female kindly referred for evaluation management of dyslipidemia.  She has a history of type 2 diabetes, dyslipidemia, mild carotid artery disease and coronary artery calcification seen previously on a CT scan.  She has a twin sister who apparently has coronary disease and has had 3 stents.  Has any history of prior cardiac events or any symptoms such as angina, dyspnea on exertion or decreased exercise tolerance.  Recently she has been residing at [facility].  She says that she eats "very well" there.  Her recent labs show an increase in triglycerides specifically that are quite high at 604, total cholesterol 222 and a direct LDL of 61.  Hemoglobin A1c is 7.1.  She unfortunately is intolerant of simvastatin which she took previously and caused her muscle weakness however at the time she had significant scoliosis and subsequently underwent spinal surgery.  She has not taken any additional statins.  She is currently on ezetimibe.  This is well-tolerated.  She has occasionally taken over-the-counter fish oil but not regularly.  Diet is variable but she does probably get a decent amount of sweets and baked goods.  01/12/2021  [name] is seen today in follow-up.  She has had a significant improvement in her triglycerides from over 600 down to 337, total cholesterol 222 2 and LDL 121.  Direct LDL was measured at 99 however this indicates an increase compared to her previous values.  After discussing with her a little bit she told me that she actually had stopped her ezetimibe and was confused as to whether or not she was supposed to continue that and the Vascepa or the Vascepa was to  replace that.  I informed her that she should have stayed on the ezetimibe.  She does report some recent improvement in her A1c.  She is trying to be more active.  PMHx:  Past Medical History:  Diagnosis Date   Anemia    Arthritis    Asthma    adult onset   Basal cell cancer    LUE; Porokeratosis also   Chronic kidney disease 1963   strep in kidney due to strep throat-hospitalized 10 days   Complication of anesthesia    small trachea   Diabetes mellitus 2010   A1c 6.7%   DVT (deep venous thrombosis) ([place]) 2006   post immobilization post cns surgery   GERD (gastroesophageal reflux disease)    very mild   Granulomatous lung disease ([place]) 2002   incidental Xray finding   Hyperlipidemia    Hypertension 2004   Hypertensive response on Stress Test   Paralysis ([place]) 2006   post cervical fusion with spinal sac tear  with hematoma    PTE (pulmonary thromboembolism) ([place]) 2006    Past Surgical History:  Procedure Laterality Date   ABDOMINAL EXPOSURE N/A 08/12/2016   Procedure: ABDOMINAL EXPOSURE;  Surgeon: Rosetta Posner, MD;  Location: [place];  Service: Vascular;  Laterality: N/A;   ANTERIOR LAT LUMBAR FUSION N/A 08/12/2016   Procedure: LUMBAR TWO-THREE, LUMBAR THREE-FOUR, LUMBAR FOUR-FIVE  ANTEROLATERAL LUMBAR INTERBODY FUSION;  Surgeon: Erline Levine, MD;  Location: [place];  Service: Neurosurgery;  Laterality: N/A;  L2-3 L3-4 L4-5 Anterolateral lumbar interbody fusion   ANTERIOR LUMBAR FUSION N/A 08/12/2016   Procedure: Lumbar Five-Sacral One Anterior lumbar interbody fusion with Dr. Sherren Mocha Early to assist;  Surgeon: Erline Levine, MD;  Location: [place];  Service: Neurosurgery;  Laterality: N/A;  L5-S1 Anterior lumbar interbody fusion with Dr. Sherren Mocha Early to assist   BUNIONECTOMY     CATARACT EXTRACTION Right 12/31/2018   CATARACT EXTRACTION Left 12/17/2018   CERVICAL FUSION  2006   Dr Annette Stable, NS;post op hematoma & cns leak & urinary retention   COLONOSCOPY  1992 & 2002   negative   epidural  steroids  2006   cervical spine   LUMBAR PERCUTANEOUS PEDICLE SCREW 4 LEVEL N/A 08/12/2016   Procedure: LUMBAR TWO-SACRAL ONE Percuataneous Pedicle Screws;  Surgeon: Erline Levine, MD;  Location: [place];  Service: Neurosurgery;  Laterality: N/A;   ROTATOR CUFF REPAIR  2009   Right   SEPTOPLASTY     TONSILLECTOMY  80 years old   TOTAL HIP ARTHROPLASTY  06/01/2012   Procedure: TOTAL HIP ARTHROPLASTY ANTERIOR APPROACH;  Surgeon: Mcarthur Rossetti, MD;  Location: [facility];  Service: Orthopedics;  Laterality: Left;  Left Total Hip Arthroplasty   TOTAL HIP ARTHROPLASTY Right 02/23/2018   Procedure: RIGHT TOTAL HIP ARTHROPLASTY ANTERIOR APPROACH;  Surgeon: Mcarthur Rossetti, MD;  Location: [facility];  Service: Orthopedics;  Laterality: Right;   TUBAL LIGATION      FAMHx:  Family History  Problem Relation Age of Onset   COPD Father        emphysema   Cancer Mother        cns cancer   Diabetes Sister        TWIN sister ; also Fibromyalgia ; S/P stent 2004   Heart disease Sister        stents @ 16 & 66   Stroke Maternal Grandmother        in  late 38s   Transient ischemic attack Paternal Aunt     SOCHx:   reports that she has never smoked. She has never used smokeless tobacco. She reports current alcohol use of about 1.0 standard drink per week. She reports that she does not use drugs.  ALLERGIES:  Allergies  Allergen Reactions   Statins Other (See Comments)    Myalgias and muscle weakness    ROS: Pertinent items noted in HPI and remainder of comprehensive ROS otherwise negative.  HOME MEDS: Current Outpatient Medications on File Prior to Visit  Medication Sig Dispense Refill   acetaminophen (TYLENOL) 650 MG CR tablet Take 650-1,300 mg by mouth 2 (two) times daily as needed for pain.     ADVAIR DISKUS 250-50 MCG/DOSE AEPB USE 1 INHALATION BY MOUTH  TWICE DAILY , RINSE MOUTH  AFTER USE 180 each 3   albuterol (VENTOLIN HFA) 108 (90 Base) MCG/ACT inhaler Inhale 2 puffs every 6  hours if needed for breathing 25.5 g 4   alendronate (FOSAMAX) 70 MG tablet Take 1 tablet (70 mg total) by mouth once a week. 12 tablet 3   Azelastine-Fluticasone (DYMISTA) 137-50 MCG/ACT SUSP 1-2 puffs each nostril once or twice daily 23 g 12   blood glucose meter kit and supplies KIT Dispense based on patient and insurance preference. Use up to four times daily as directed. 1 each 0   Calcium Citrate-Vitamin D (CALCIUM + D PO) Take 1 tablet by mouth daily.     celecoxib (CELEBREX) 200 MG capsule Take 1 capsule (200 mg total)  by mouth daily. (Patient taking differently: Take 200 mg by mouth daily. Takes 200 MG Every Other Day.) 30 capsule 1   Cholecalciferol (VITAMIN D) 50 MCG (2000 UT) CAPS Take 2,000 Units by mouth daily.      COVID-19 mRNA vaccine, Moderna, 100 MCG/0.5ML injection Inject into the muscle. 0.3 mL 0   cyclobenzaprine (FLEXERIL) 10 MG tablet Take 1 tablet (10 mg total) by mouth 3 (three) times daily as needed. For Hip concerns (Patient taking differently: Take 10 mg by mouth at bedtime. Takes 5 MG at Bedtime) 30 tablet 2   ezetimibe (ZETIA) 10 MG tablet Take 1 tablet (10 mg total) by mouth daily. 90 tablet 1   FEROSUL 325 (65 Fe) MG tablet TAKE 1 TABLET(325 MG) BY MOUTH DAILY WITH BREAKFAST 90 tablet 1   Fluticasone-Salmeterol (ADVAIR DISKUS) 250-50 MCG/DOSE AEPB Inhale 1 puff into the lungs 2 (two) times daily. USE 1 INHALATION TWO TIMES  DAILY ; RINSE MOUTH 60 each 5   glucose blood test strip Use as instructed 100 each 12   icosapent Ethyl (VASCEPA) 1 g capsule Take 2 capsules (2 g total) by mouth 2 (two) times daily. 120 capsule 11   Lancets MISC 1 each by Does not apply route 2 (two) times daily. 100 each 0   Lidocaine 4 % PTCH Apply 1 patch topically as needed (back pain).     metFORMIN (GLUCOPHAGE-XR) 500 MG 24 hr tablet Take 2 tablets by mouth daily 180 tablet 1   Multiple Vitamin (MULTIVITAMIN WITH MINERALS) TABS tablet Take 1 tablet by mouth daily.     Multiple  Vitamins-Minerals (OCUVITE EXTRA PO) Take 1 tablet by mouth daily.     mupirocin ointment (BACTROBAN) 2 % Apply 1 application topically 3 (three) times daily. 30 g 3   MYRBETRIQ 50 MG TB24 tablet TAKE 1 TABLET BY MOUTH  DAILY 90 tablet 3   Polyethyl Glycol-Propyl Glycol (SYSTANE OP) Place 1 drop into both eyes daily as needed (dry eyes).     polyethylene glycol (MIRALAX / GLYCOLAX) packet Take 17 g by mouth daily as needed for mild constipation.      pregabalin (LYRICA) 100 MG capsule TAKE 1 CAPSULE(100 MG) BY MOUTH THREE TIMES DAILY 270 capsule 1   Trolamine Salicylate (ASPERCREME EX) Apply 1 application topically daily as needed (back pain).     aspirin EC 81 MG tablet Take 81 mg by mouth daily. (Patient not taking: No sig reported)     No current facility-administered medications on file prior to visit.    LABS/IMAGING: No results found for this or any previous visit (from the past 48 hour(s)). No results found.  LIPID PANEL:    Component Value Date/Time   CHOL 222 (H) 01/05/2021 0938   TRIG 337 (H) 01/05/2021 0938   HDL 42 01/05/2021 0938   CHOLHDL 5.3 (H) 01/05/2021 0938   CHOLHDL 6 08/11/2020 1059   VLDL 61.0 (H) 01/28/2019 1425   LDLCALC 121 (H) 01/05/2021 0938   LDLDIRECT 99 01/05/2021 0938   LDLDIRECT 61.0 08/11/2020 1059    WEIGHTS: Wt Readings from Last 3 Encounters:  01/12/21 163 lb (73.9 kg)  11/12/20 164 lb 3.2 oz (74.5 kg)  08/20/20 164 lb 6.4 oz (74.6 kg)    VITALS: BP 130/70 (BP Location: Left Arm)   Pulse 68   Ht 5' 3.5" (1.613 m)   Wt 163 lb (73.9 kg)   SpO2 95%   BMI 28.42 kg/m   EXAM: Deferred    EKG: Deferred  ASSESSMENT: Mixed dyslipidemia with high triglycerides Type 2 diabetes-A1c 7.1 Mild carotid artery disease Coronary artery calcification on CT scan Family history of coronary artery disease in her twin sister  PLAN: 1.   Ms. Maniaci has had almost 50% reduction in triglycerides but her LDL is increased.  She has inadvertently  stopped her ezetimibe because of some confusion and I encouraged her to restart that.  I suspect that her lipids will improve even further.  Her A1c is down as well.  Plan follow-up with me in about 6 months with repeat lipids and direct LDL  Pixie Casino, MD, [facility], [place] Director of the Advanced Lipid Disorders &  Cardiovascular Risk Reduction Clinic Diplomate of the American Board of Clinical Lipidology Attending Cardiologist  Direct Dial: [phone]  Fax: [phone]  Website:  [email or web address] [email or web address] 01/12/2021, 11:29 AM

## 2021-01-12 NOTE — Patient Instructions (Signed)
  Lab Work:  Your physician recommends that you return for lab work in: 6 MONTHS-FASTING-[facility]  If you have labs (blood work) drawn today and your tests are completely normal, you will receive your results only by: [place] (if you have [place]) OR A paper copy in the mail If you have any lab test that is abnormal or we need to change your treatment, we will call you to review the results   Follow-Up: At [facility], you and your health needs are our priority.  As part of our continuing mission to provide you with exceptional heart care, we have created designated Provider Care Teams.  These Care Teams include your primary Cardiologist (physician) and Advanced Practice Providers (APPs -  Physician Assistants and Nurse Practitioners) who all work together to provide you with the care you need, when you need it.  We recommend signing up for the patient portal called "MyChart".  Sign up information is provided on this After Visit Summary.  MyChart is used to connect with patients for Virtual Visits (Telemedicine).  Patients are able to view lab/test results, encounter notes, upcoming appointments, etc.  Non-urgent messages can be sent to your provider as well.   To learn more about what you can do with MyChart, go to [email or web address].    Your next appointment:   6 month(s)  The format for your next appointment:   In Person  Provider:   K. Mali Hilty, MD

## 2021-01-13 DIAGNOSIS — Z7984 Long term (current) use of oral hypoglycemic drugs: Secondary | ICD-10-CM | POA: Diagnosis not present

## 2021-01-13 DIAGNOSIS — H5203 Hypermetropia, bilateral: Secondary | ICD-10-CM | POA: Diagnosis not present

## 2021-01-13 DIAGNOSIS — E119 Type 2 diabetes mellitus without complications: Secondary | ICD-10-CM | POA: Diagnosis not present

## 2021-01-13 DIAGNOSIS — H40013 Open angle with borderline findings, low risk, bilateral: Secondary | ICD-10-CM | POA: Diagnosis not present

## 2021-01-13 DIAGNOSIS — Z961 Presence of intraocular lens: Secondary | ICD-10-CM | POA: Diagnosis not present

## 2021-01-13 DIAGNOSIS — H35372 Puckering of macula, left eye: Secondary | ICD-10-CM | POA: Diagnosis not present

## 2021-01-13 DIAGNOSIS — H0102B Squamous blepharitis left eye, upper and lower eyelids: Secondary | ICD-10-CM | POA: Diagnosis not present

## 2021-01-13 DIAGNOSIS — H35433 Paving stone degeneration of retina, bilateral: Secondary | ICD-10-CM | POA: Diagnosis not present

## 2021-01-13 DIAGNOSIS — H524 Presbyopia: Secondary | ICD-10-CM | POA: Diagnosis not present

## 2021-01-13 DIAGNOSIS — H52223 Regular astigmatism, bilateral: Secondary | ICD-10-CM | POA: Diagnosis not present

## 2021-01-13 LAB — HM DIABETES EYE EXAM

## 2021-01-25 DIAGNOSIS — M4120 Other idiopathic scoliosis, site unspecified: Secondary | ICD-10-CM | POA: Diagnosis not present

## 2021-01-25 DIAGNOSIS — M5416 Radiculopathy, lumbar region: Secondary | ICD-10-CM | POA: Diagnosis not present

## 2021-01-25 DIAGNOSIS — R030 Elevated blood-pressure reading, without diagnosis of hypertension: Secondary | ICD-10-CM | POA: Diagnosis not present

## 2021-01-25 DIAGNOSIS — M48062 Spinal stenosis, lumbar region with neurogenic claudication: Secondary | ICD-10-CM | POA: Diagnosis not present

## 2021-02-18 ENCOUNTER — Encounter: Payer: Medicare Other | Admitting: Family Medicine

## 2021-03-11 ENCOUNTER — Ambulatory Visit (INDEPENDENT_AMBULATORY_CARE_PROVIDER_SITE_OTHER): Payer: Medicare Other | Admitting: Pharmacist

## 2021-03-11 ENCOUNTER — Encounter: Payer: Self-pay | Admitting: Family Medicine

## 2021-03-11 DIAGNOSIS — E782 Mixed hyperlipidemia: Secondary | ICD-10-CM

## 2021-03-11 DIAGNOSIS — E1165 Type 2 diabetes mellitus with hyperglycemia: Secondary | ICD-10-CM | POA: Diagnosis not present

## 2021-03-11 DIAGNOSIS — M8580 Other specified disorders of bone density and structure, unspecified site: Secondary | ICD-10-CM

## 2021-03-11 NOTE — Chronic Care Management (AMB) (Signed)
Chronic Care Management Pharmacy Note  03/11/2021 Name:  ARMONI KLUDT MRN:  [id number] DOB:  1941/04/20   Subjective: [name] is an 80 y.o. year old female who is a primary patient of [name], DO.  The CCM team was consulted for assistance with disease management and care coordination needs.    Engaged with patient by telephone for follow up visit in response to provider referral for pharmacy case management and/or care coordination services.   Consent to Services:  The patient was given information about Chronic Care Management services, agreed to services, and gave verbal consent prior to initiation of services.  Please see initial visit note for detailed documentation.   Patient Care Team: Carollee Herter, Alferd Apa, DO as PCP - General (Family Medicine) Deneise Lever, MD as Consulting Physician (Pulmonary Disease) Erline Levine, MD as Consulting Physician (Neurosurgery) Marlou Sa, Tonna Corner, MD as Consulting Physician (Orthopedic Surgery) Mcarthur Rossetti, MD as Consulting Physician (Orthopedic Surgery) Danella Sensing, MD as Consulting Physician (Dermatology) Day, Melvenia Beam, [facility] [place] (Inactive) as Pharmacist (Pharmacist)  Recent office visits: 11/12/20- [name], DO- seen for left hip pain, increased mupirocin ointment use from prn to three times daily, x rays ordered, follow up 3 months   08/11/20- PCP (Dr Carollee Herter) Seen for uncontrolled type 2 diabetes, started celecoxib 200 mg daily, started cyclobenzaprine 10 mg three times daily prn, labs ordered, started glucose monitoring, follow up 6 months  Recent consult visits: 01/25/2021 Neurosurgery Shearon Stalls, [facility]) Seen for pain.  01/13/2021 - ophthalmology (Dr Antionette Fairy) diabetic eye exam. No retinopathy noted.  01/12/2021 - Cardio (Dr Debara Pickett) Elevated cholesterol with high triglycerides and CAD. Patient had misunderstood directions and stopped ezetimibe when Icosapent ethyl (vascepa). Restarted  ezetimbe21m daily and continue vascepa 2 capsules twice a day.  08/20/20- KLyman Bishop MD (Cardiology)- seen for evaluation of dyslipidemia, started icosapent ethyl 2 g twice daily, follow up 3 months   Hospital visits: None in previous 6 months  Objective:  Lab Results  Component Value Date   CREATININE 0.94 08/11/2020   CREATININE 0.90 08/02/2019   CREATININE 0.96 01/28/2019    Lab Results  Component Value Date   HGBA1C 7.1 (H) 08/11/2020   Last diabetic Eye exam:  Lab Results  Component Value Date/Time   HMDIABEYEEXA No Retinopathy 01/13/2021 12:00 AM    Last diabetic Foot exam: No results found for: HMDIABFOOTEX      Component Value Date/Time   CHOL 222 (H) 01/05/2021 0938   TRIG 337 (H) 01/05/2021 0938   HDL 42 01/05/2021 0938   CHOLHDL 5.3 (H) 01/05/2021 0938   CHOLHDL 6 08/11/2020 1059   VLDL 61.0 (H) 01/28/2019 1425   LDLCALC 121 (H) 01/05/2021 0938   LDLDIRECT 99 01/05/2021 0938   LDLDIRECT 61.0 08/11/2020 1059    Hepatic Function Latest Ref Rng & Units 08/11/2020 08/02/2019 01/28/2019  Total Protein 6.0 - 8.3 g/dL 6.4 7.0 6.9  Albumin 3.5 - 5.2 g/dL 3.7 4.0 3.9  AST 0 - 37 U/L '18 20 19  ' ALT 0 - 35 U/L '22 23 18  ' Alk Phosphatase 39 - 117 U/L 66 68 80  Total Bilirubin 0.2 - 1.2 mg/dL 0.4 0.4 0.4  Bilirubin, Direct 0.0 - 0.3 mg/dL - - -    Lab Results  Component Value Date/Time   TSH 5.38 (H) 06/03/2016 12:13 PM   TSH 2.60 04/05/2013 08:54 AM   FREET4 0.8 (L) 05/08/2006 09:54 AM    CBC Latest Ref Rng &  Units 01/28/2019 07/19/2018 07/07/2018  WBC 4.0 - 10.5 K/uL 8.6 7.2 -  Hemoglobin 12.0 - 15.0 g/dL 12.4 12.6 10.4(L)  Hematocrit 36.0 - 46.0 % 37.5 38.9 34.2(L)  Platelets 150.0 - 400.0 K/uL 218.0 302.0 -    Lab Results  Component Value Date/Time   VD25OH 55 11/08/2010 09:03 AM    Clinical ASCVD: Yes  The ASCVD Risk score (Arnett DK, et al., 2019) failed to calculate for the following reasons:   The 2019 ASCVD risk score is only valid for ages 45  to 8    Other: (CHADS2VASc if Afib, PHQ9 if depression, MMRC or CAT for COPD, ACT, DEXA)  Social History   Tobacco Use  Smoking Status Never  Smokeless Tobacco Never   BP Readings from Last 3 Encounters:  01/12/21 130/70  11/12/20 118/68  08/20/20 120/60   Pulse Readings from Last 3 Encounters:  01/12/21 68  11/12/20 66  08/20/20 62   Wt Readings from Last 3 Encounters:  01/12/21 163 lb (73.9 kg)  11/12/20 164 lb 3.2 oz (74.5 kg)  08/20/20 164 lb 6.4 oz (74.6 kg)    Assessment: Review of patient past medical history, allergies, medications, health status, including review of consultants reports, laboratory and other test data, was performed as part of comprehensive evaluation and provision of chronic care management services.   SDOH:  (Social Determinants of Health) assessments and interventions performed:  SDOH Interventions    Flowsheet Row Most Recent Value  SDOH Interventions   Financial Strain Interventions Intervention Not Indicated  Physical Activity Interventions Other (Comments)  [discussed increasing exercise to 150 minutes per week,  recommended try exericse class at [facility].]       CCM Care Plan  Allergies  Allergen Reactions   Statins Other (See Comments)    Myalgias and muscle weakness    Medications Reviewed Today     Reviewed by Cherre Robins, PharmD (Pharmacist) on 03/11/21 at [phone]  Med List Status: <None>   Medication Order Taking? Sig Documenting Provider Last Dose Status Informant  acetaminophen (TYLENOL) 650 MG CR tablet [id number] Yes Take 650-1,300 mg by mouth 2 (two) times daily as needed for pain. [provider] Taking Active Self  ADVAIR DISKUS 250-50 MCG/DOSE Graylin Shiver [id number] Yes USE 1 INHALATION BY MOUTH  TWICE DAILY , RINSE MOUTH  AFTER USE Young, Clinton D, MD Taking Active   albuterol (VENTOLIN HFA) 108 (90 Base) MCG/ACT inhaler [id number] Yes Inhale 2 puffs every 6 hours if needed for breathing Baird Lyons D, MD Taking  Active   alendronate (FOSAMAX) 70 MG tablet [id number] Yes Take 1 tablet (70 mg total) by mouth once a week. [name] R, DO Taking Active   aspirin EC 81 MG tablet [id number] No Take 81 mg by mouth daily.  Patient not taking: No sig reported   [provider] Not Taking Active   Azelastine-Fluticasone (DYMISTA) 137-50 MCG/ACT SUSP [id number] Yes 1-2 puffs each nostril once or twice daily  Patient taking differently: 1-2 puffs each nostril once or twice daily as needed   Baird Lyons D, MD Taking Active   blood glucose meter kit and supplies KIT [id number] Yes Dispense based on patient and insurance preference. Use up to four times daily as directed. [name] R, DO Taking Active   Calcium Citrate-Vitamin D (CALCIUM + D PO) [id number] Yes Take 1,200 mg by mouth daily. [provider] Taking Active Self  celecoxib (CELEBREX) 200 MG capsule [id number] Yes Take 1 capsule (200 mg total)  by mouth daily.  Patient taking differently: Take 200 mg by mouth daily as needed.   [name], DO Taking Active   cetirizine (ZYRTEC) 10 MG tablet [id number] Yes Take 10 mg by mouth daily as needed for allergies. [provider] Taking Active   Cholecalciferol (VITAMIN D) 50 MCG (2000 UT) CAPS [id number] Yes Take 2,000 Units by mouth daily.  [provider] Taking Active Self  cyclobenzaprine (FLEXERIL) 10 MG tablet [id number] Yes Take 1 tablet (10 mg total) by mouth 3 (three) times daily as needed. For Hip concerns  Patient taking differently: Take 5 mg by mouth at bedtime. As needed   [name], DO Taking Active   diclofenac Sodium (VOLTAREN) 1 % GEL [id number] Yes Apply topically daily as needed. [provider] Taking Active   ezetimibe (ZETIA) 10 MG tablet [id number] Yes TAKE 1 TABLET(10 MG) BY MOUTH DAILY [name], DO Taking Active   FEROSUL 325 (65 Fe) MG tablet [id number] Yes TAKE 1 TABLET(325 MG) BY MOUTH DAILY WITH  BREAKFAST [name] R, DO Taking Active   glucose blood test strip [id number] Yes Use as instructed Carollee Herter, Alferd Apa, DO Taking Active   icosapent Ethyl (VASCEPA) 1 g capsule [id number] Yes Take 2 capsules (2 g total) by mouth 2 (two) times daily. Pixie Casino, MD Taking Active   Lancets MISC [id number] Yes 1 each by Does not apply route 2 (two) times daily. [name] R, DO Taking Active   Lidocaine 4 % [facility] [id number] Yes Apply 1 patch topically as needed (back pain). [provider] Taking Active Self  metFORMIN (GLUCOPHAGE-XR) 500 MG 24 hr tablet [id number] Yes TAKE 2 TABLETS BY MOUTH DAILY  Patient taking differently: Take 500 mg by mouth 2 (two) times daily with a meal.   Carollee Herter, Alferd Apa, DO Taking Active   Multiple Vitamin (MULTIVITAMIN WITH MINERALS) TABS tablet [id number] Yes Take 1 tablet by mouth daily. Women's One a day [provider] Taking Active Self  Multiple Vitamins-Minerals (PRESERVISION AREDS 2+MULTI VIT PO) [id number] Yes Take 1 tablet by mouth in the morning and at bedtime. [provider] Taking Active   mupirocin ointment (BACTROBAN) 2 % [id number] Yes Apply 1 application topically 3 (three) times daily. [name], [place] Taking Active   MYRBETRIQ 50 MG TB24 tablet [id number] Yes TAKE 1 TABLET BY MOUTH  DAILY [name], DO Taking Active   Polyethyl Glycol-Propyl Glycol (SYSTANE OP) [id number] Yes Place 1 drop into both eyes daily as needed (dry eyes). [provider] Taking Active Self  polyethylene glycol (MIRALAX / GLYCOLAX) packet [id number] Yes Take 17 g by mouth daily as needed for mild constipation.  [provider] Taking Active Self  pregabalin (LYRICA) 100 MG capsule [id number] Yes TAKE 1 CAPSULE(100 MG) BY MOUTH THREE TIMES DAILY Carollee Herter, Alferd Apa, DO Taking Active   Trolamine Salicylate (ASPERCREME EX) [id number]  Apply 1 application topically daily as needed (back pain).  [provider]  Active Self  Med List Note Annamaria Boots, Kasandra Knudsen, MD 05/12/11 2153): Allergy vaccine 1:10 GO            Patient Active Problem List   Diagnosis Date Noted   Left hip pain 11/12/2020   Osteoarthritis 08/02/2019   Uncontrolled type 2 diabetes mellitus with hyperglycemia ([place]) 01/28/2019   Essential hypertension 01/28/2019   Hyperlipidemia associated with type 2 diabetes mellitus ([place]) 01/28/2019   Breast pain, left  01/28/2019   CAP (community acquired pneumonia) 07/06/2018   Pleuritic chest pain 07/06/2018   Pleural effusion    Status post total replacement of right hip 02/23/2018   Cellulitis 12/28/2017   Unilateral primary osteoarthritis, right hip 12/14/2017   Type 2 diabetes mellitus with diabetic neuropathy ([place]) 06/08/2017   Neuropathic pain 03/30/2017   Seasonal allergies 03/30/2017   Lumbar scoliosis 08/12/2016   Controlled diabetes mellitus type 2 with complications ([place]) 35/36/1443   Scoliosis 11/24/2015   Low back pain 09/29/2014   Diabetes mellitus with peripheral vascular disease ([place]) 04/10/2013   Left carotid bruit 04/10/2013   Anemia 03/31/2013   Degenerative arthritis of hip 06/01/2012   POSTMENOPAUSAL SYNDROME 09/16/2009   ARTHRALGIA 09/16/2009   Seasonal and perennial allergic rhinitis 10/08/2007   Osteopenia 08/06/2007   ELEVATED BLOOD PRESSURE WITHOUT DIAGNOSIS OF HYPERTENSION 08/06/2007   HYPERLIPIDEMIA 05/14/2007   Asthma, mild intermittent 05/14/2007   History of pulmonary embolus (PE) 08/31/2006    Immunization History  Administered Date(s) Administered   Fluad Quad(high Dose 65+) 01/28/2019, 03/31/2020   Influenza Inj Mdck Quad With Preservative 03/23/2018   Influenza Split 03/15/2012   Influenza Whole 02/27/2009   Influenza, High Dose Seasonal PF 03/19/2013, 03/21/2016   Influenza,inj,Quad PF,6+ Mos 03/13/2014, 02/16/2015   Influenza-Unspecified 03/15/2012, 01/28/2014, 03/13/2014, 02/16/2015, 02/14/2017   Moderna  SARS-COV2 Booster Vaccination 04/14/2020, 12/04/2020   Moderna Sars-Covid-2 Vaccination 06/11/2019, 07/09/2019   Pneumococcal Conjugate-13 05/16/2013   Pneumococcal Polysaccharide-23 09/29/2014, 02/07/2020   Tdap 10/28/2010   Zoster Recombinat (Shingrix) 02/11/2019, 04/12/2019   Zoster, Live 05/12/2014    Conditions to be addressed/monitored: HLD, Hypertriglyceridemia, DMII, Pulmonary Disease, and OAB, chronic pain; osteopenia  Care Plan : General Pharmacy (Adult)  Updates made by Cherre Robins, PHARMD since 03/11/2021 12:00 AM     Problem: asthma, type 2 DM, hyperlipidemia; carotid bruits; osteopenia; back pain; scoliosis; OAB   Priority: High     Long-Range Goal: Provide education, support and care coordination for medication therapy and chronic conditions   Start Date: 03/11/2021  Expected End Date: 10/29/2021  Priority: High  Note:   Current Barriers:  Unable to achieve control of mixed hyperlipidemia  Does not adhere to prescribed medication regimen  Pharmacist Clinical Goal(s):  Over the next 90 days, patient will achieve adherence to monitoring guidelines and medication adherence to achieve therapeutic efficacy achieve control of mixed hyperlipidemia and type 2 DM as evidenced by LDL <100 and A1c < 7.0% maintain control of overactive bladder as evidenced by decreased symptoms of urinary frequency and leakage  through collaboration with PharmD and provider.   Interventions: 1:1 collaboration with Carollee Herter, Alferd Apa, DO regarding development and update of comprehensive plan of care as evidenced by provider attestation and co-signature Inter-disciplinary care team collaboration (see longitudinal plan of care) Comprehensive medication review performed; medication list updated in electronic medical record  Asthma: Controlled Rescue inhaler use - once per month or less Maintenance inhaler - daily Current treatment: Advair 250/70mg Discus - inhale 1 puff into lungs twice  a day Albuterol inhaler - inhaler 2 puffs every 6 hours as needed for shortness of breath or wheezing Interventions:  Educated on maintenance versus rescue inhaler Recommended rinse mouth / gargle after each use of Advair to prevent thrush  Osteopenia:  Current treatment: Alendronate 769mweekly  Calcium 120055maily Vitamin D 2000 IU daily  Last DEXA: 02/21/2020 Left Forearm Radius 33% 02/21/2020 was -2.1  Left Forearm Radius 33% 01/18/2018 was -2.0  Lumbar spine was not utilized due to  advanced degenerative changes.  Compared with the prior study on, 01/18/2018 the BMD of the total mean shows no statistically significant change. The scan quality is good. The bilateral hips were not scanned due to hip replacements. FRAX cannot be performed due to total hip replacements bilaterally. Patient also is receiving bone building medication. Interventions:   None today Continue current therapy   Hypertension Screening BP Readings from Last 3 Encounters:  01/12/21 130/70  11/12/20 118/68  08/20/20 120/60  Blood pressure controlled without pharmacotherapy intervention Blood pressure goal <130/80 Interventions:  Maintain blood pressure less than 130/80 Encouraged increase in exercise as able to goal of 150 minute per week.   Hyperlipidemia, mixed Not at goal;  LDL goal < 100 and Tg goal <150  Managed by cardio - Dr Debara Pickett - next appt 05/2021 Current regimen:  Ezetimibe 55m daily each morning (restarted 12/2020) Vascepa / icosapent ethyl 1 gram - take 2 capsules twice a day Interventions: Discussed LDL  and Tg goals Verified she has been taking and refilling ezetimibe since 12/2020.  Encouraged increase in exercise as able to goal of 150 minute per week.  Diabetes Lab Results  Component Value Date/Time   HGBA1C 7.1 (H) 08/11/2020 10:59 AM   HGBA1C 7.0 (H) 02/07/2020 02:49 PM  Last A1c increase slightly above goal; A1c goal <7% Current regimen:  Metformin 5057m- take 1 tablet  twice a day with food Interventions: Encouraged increase in exercise as able to goal of 150 minute per week. Discussed DM goals Check blood glucose at home every other day Reviewed home blood glucose goals  Fasting blood glucose goal (before meals) = 80 to 130 Blood glucose goal after a meal = less than 180   Health Maintenance:  Reviewed vaccination history and discussed benefits of annual flu and Tdap vaccinations Patient's last COVID booster was 11/2020 - consider getting COVID bivalent booster in November 2022.  Patient has eye exam 01/13/2021 byt Dr RaAntionette Fairyith [facility] but is not showing in Health Maintenance as completed.  Interventions:  Patient to get flu vaccine and Tdap at appointment with PCP Monday 03/15/2021 Printed visit summary for diabetic eye exam forwarded to KiRoderic OvensRN to enter into Health Maintenance and scan to patient's chart.  [facility] [place]  Medication management Pharmacist Clinical Goal(s): Over the next 90 days, patient will work with PharmD and providers to maintain optimal medication adherence [facility] Interventions Comprehensive medication review performed. Continue current medication management strategy Reviewed refill history Patient self care activities - Over the next 90 days, patient will: Focus on medication adherence by filling and taking medications appropriately  Take medications as prescribed Report any questions or concerns to PharmD and/or provider(s)   Patient Goals/Self-Care Activities Over the next 90 days, patient will:  take medications as prescribed, check glucose every other day, document, and provide at future appointments, Increase exercise with a target a minimum of 150 minutes of moderate intensity exercise weekly as able Get annual flu vaccine, COVID booster (in November)  and tetanus vaccine / booster  Follow Up Plan: Telephone follow up appointment with care management team member scheduled for:  3 months         Medication Assistance: None required.  Patient affirms current coverage meets needs.  [id number] [address] [place] [address] Phone: [phone] Fax: [phone]  [facility]  ([place], [place] [facility] [address] [place] [address] [place] [address] Phone: [phone] Fax: [phone]    Follow Up:  Patient agrees to Care Plan and Follow-up.  Plan: Telephone follow up appointment with care management team member scheduled for:  3 months  Cherre Robins, PharmD Clinical Pharmacist [place] [facility]

## 2021-03-11 NOTE — Patient Instructions (Signed)
Visit Information  PATIENT GOALS:  Goals Addressed             This Visit's Progress    Chronic Care Management Pharmacy Care Plan   On track    CARE PLAN ENTRY (see longitudinal plan of care for additional care plan information)  Current Barriers:  Chronic Disease Management support, education, and care coordination needs related to  Hypertension, Hyperlipidemia, Diabetes, Asthma, Osteopenia, Overactive Bladder, Neuropathy, Anemia  Hyperlipidemia, mixed Not at goal;  LDL goal < 100 and Tg goal <150  Lab Results  Component Value Date/Time   LDLCALC 121 (H) 01/05/2021 09:38 AM   LDLDIRECT 99 01/05/2021 09:38 AM   LDLDIRECT 61.0 08/11/2020 10:59 AM  Pharmacist Clinical Goal(s): Over the next 90 days, patient will work with PharmD and providers to maintain LDL goal < 100 Current regimen:  Ezetimibe 10mg  daily  Vascepa / icosapent ethyl 1 gram - take 2 capsules twice a day Interventions: Discussed LDL  and Tg goals Verified she has been taking and refilling ezetimibe since 12/2020.  Encouraged increase in exercise as able to goal of 150 minute per week. Patient self care activities - Over the next 90 days, patient will: Maintain cholesterol medication regimen.   Diabetes Lab Results  Component Value Date/Time   HGBA1C 7.1 (H) 08/11/2020 10:59 AM   HGBA1C 7.0 (H) 02/07/2020 02:49 PM  Pharmacist Clinical Goal(s): Over the next 90 days, patient will work with PharmD and providers to achieve A1c goal <7% Current regimen:  Metformin 500mg  - take 1 tablet twice a day with food Interventions: Encouraged increase in exercise as able to goal of 150 minute per week. Discussed DM goals Patient self care activities - Over the next 90 days, patient will: Maintain current diabetes regimen Encouraged increase in exercise as able to goal of 150 minute per week. Check blood glucose at home every other day Reviewed home blood glucose goals  Fasting blood glucose goal (before meals) =  80 to 130 Blood glucose goal after a meal = less than 180  Asthma: Controlled Current treatment: Advair 250/39mcg Discus - inhale 1 puff into lungs twice a day Albuterol inhaler - inhaler 2 puffs every 6 hours as needed for shortness of breath or wheezing Interventions:  Educated on maintenance versus rescue inhaler Recommended rinse mouth / gargle after each use of Advair to prevent thrush  Osteopenia:  Current treatment: Alendronate 70mg  weekly  Calcium 1200mg  daily Vitamin D 2000 IU daily  Interventions:   Continue current therapy  Health Maintenance:  Reviewed vaccination history and discussed benefits of annual flu and Tdap vaccinations Patient's last COVID booster was 11/2020 - consider getting COVID bivalent booster in November 2022.  Patient had eye exam 01/13/2021 by Dr Antionette Fairy with [facility] Interventions:  Patient to get flu vaccine and Tdap at appointment with PCP Monday 03/15/2021 Patient plans to get COVID booster at [facility] (close to her apartment in [place])   Medication management Pharmacist Clinical Goal(s): Over the next 90 days, patient will work with PharmD and providers to maintain optimal medication adherence [facility] Interventions Comprehensive medication review performed. Continue current medication management strategy Patient self care activities - Over the next 90 days, patient will: Focus on medication adherence by filling and taking medications appropriately  Take medications as prescribed Report any questions or concerns to PharmD and/or provider(s)   Patient Goals/Self-Care Activities Over the next 90 days, patient will:  take medications as prescribed, check glucose every other  day, document, and provide at future appointments, Increase exercise with a target a minimum of 150 minutes of moderate intensity exercise weekly as able Get annual flu vaccine, COVID booster (in  November) and tetanus vaccine / booster    Please see past updates related to this goal by clicking on the "Past Updates" button in the selected goal          The patient verbalized understanding of instructions, educational materials, and care plan provided today and agreed to receive a mailed copy of patient instructions, educational materials, and care plan.   Telephone follow up appointment with care management team member scheduled for: 3 months   Cherre Robins, PharmD Clinical Pharmacist [place] [facility]

## 2021-03-15 ENCOUNTER — Other Ambulatory Visit: Payer: Self-pay

## 2021-03-15 ENCOUNTER — Encounter: Payer: Self-pay | Admitting: Family Medicine

## 2021-03-15 ENCOUNTER — Other Ambulatory Visit (HOSPITAL_BASED_OUTPATIENT_CLINIC_OR_DEPARTMENT_OTHER): Payer: Self-pay | Admitting: Family Medicine

## 2021-03-15 ENCOUNTER — Ambulatory Visit (INDEPENDENT_AMBULATORY_CARE_PROVIDER_SITE_OTHER): Payer: Medicare Other | Admitting: Family Medicine

## 2021-03-15 VITALS — BP 124/62 | HR 84 | Temp 97.8°F | Resp 18 | Ht 63.5 in | Wt 162.8 lb

## 2021-03-15 DIAGNOSIS — Z23 Encounter for immunization: Secondary | ICD-10-CM | POA: Diagnosis not present

## 2021-03-15 DIAGNOSIS — Z0000 Encounter for general adult medical examination without abnormal findings: Secondary | ICD-10-CM

## 2021-03-15 DIAGNOSIS — I10 Essential (primary) hypertension: Secondary | ICD-10-CM

## 2021-03-15 DIAGNOSIS — E1165 Type 2 diabetes mellitus with hyperglycemia: Secondary | ICD-10-CM | POA: Diagnosis not present

## 2021-03-15 DIAGNOSIS — E785 Hyperlipidemia, unspecified: Secondary | ICD-10-CM

## 2021-03-15 DIAGNOSIS — Z1231 Encounter for screening mammogram for malignant neoplasm of breast: Secondary | ICD-10-CM

## 2021-03-15 NOTE — Progress Notes (Signed)
Subjective:     [name] is a 80 y.o. female and is here for a comprehensive physical exam. The patient reports no problems. She is also f/u on dm, chol and bp.   Her bs are running 114-140.   No other complaints  Social History   Socioeconomic History   Marital status: Divorced    Spouse name: Not on file   Number of children: Not on file   Years of education: Not on file   Highest education level: Not on file  Occupational History   Not on file  Tobacco Use   Smoking status: Never   Smokeless tobacco: Never  Vaping Use   Vaping Use: Never used  Substance and Sexual Activity   Alcohol use: Yes    Alcohol/week: 1.0 standard drink    Types: 1 Glasses of wine per week    Comment: socially   Drug use: No   Sexual activity: Never  Other Topics Concern   Not on file  Social History Narrative   Not on file   Social Determinants of Health   Financial Resource Strain: Low Risk    Difficulty of Paying Living Expenses: Not hard at all  Food Insecurity: Not on file  Transportation Needs: Not on file  Physical Activity: Insufficiently Active   Days of Exercise per Week: 2 days   Minutes of Exercise per Session: 30 min  Stress: Not on file  Social Connections: Moderately Integrated   Frequency of Communication with Friends and Family: Three times a week   Frequency of Social Gatherings with Friends and Family: Once a week   Attends Religious Services: More than 4 times per year   Active Member of Clubs or Organizations: Yes   Attends Music therapist: More than 4 times per year   Marital Status: Divorced  Intimate Partner Violence: Not on file   Health Maintenance  Topic Date Due   TETANUS/TDAP  10/27/2020   INFLUENZA VACCINE  12/28/2020   HEMOGLOBIN A1C  02/11/2021   MAMMOGRAM  02/20/2021   FOOT EXAM  08/11/2021   URINE MICROALBUMIN  08/11/2021   OPHTHALMOLOGY EXAM  01/13/2022   DEXA SCAN  Completed   COVID-19 Vaccine  Completed   Zoster Vaccines-  Shingrix  Completed   HPV VACCINES  Aged Out    The following portions of the patient's history were reviewed and updated as appropriate: She  has a past medical history of Anemia, Arthritis, Asthma, Basal cell cancer, Chronic kidney disease (2683), Complication of anesthesia, Diabetes mellitus (2010), DVT (deep venous thrombosis) ([place]) (2006), GERD (gastroesophageal reflux disease), Granulomatous lung disease ([place]) (2002), Hyperlipidemia, Hypertension (2004), Paralysis ([place]) (2006), and PTE (pulmonary thromboembolism) ([place]) (2006). She does not have any pertinent problems on file. She  has a past surgical history that includes Bunionectomy; Septoplasty; Tubal ligation; Colonoscopy (1992 & 2002); Rotator cuff repair (2009); epidural steroids (2006); Cervical fusion (2006); Tonsillectomy (80 years old); Total hip arthroplasty (06/01/2012); Anterior lumbar fusion (N/A, 08/12/2016); Anterior lat lumbar fusion (N/A, 08/12/2016); Lumbar percutaneous pedicle screw 4 level (N/A, 08/12/2016); Abdominal exposure (N/A, 08/12/2016); Total hip arthroplasty (Right, 02/23/2018); Cataract extraction (Right, 12/31/2018); and Cataract extraction (Left, 12/17/2018). Her family history includes COPD in her father; Cancer in her mother; Diabetes in her sister; Heart disease in her sister; Stroke in her maternal grandmother; Transient ischemic attack in her paternal aunt. She  reports that she has never smoked. She has never used smokeless tobacco. She reports current alcohol use of about 1.0  standard drink per week. She reports that she does not use drugs. She has a current medication list which includes the following prescription(s): acetaminophen, advair diskus, albuterol, alendronate, aspirin ec, azelastine-fluticasone, blood glucose meter kit and supplies, calcium citrate-vitamin d, celecoxib, cetirizine, vitamin d, diclofenac sodium, ezetimibe, ferosul, glucose blood, icosapent ethyl, lancets, lidocaine, metformin, multivitamin  with minerals, multiple vitamins-minerals, mupirocin ointment, myrbetriq, polyethyl glycol-propyl glycol, polyethylene glycol, pregabalin, and trolamine salicylate. Current Outpatient Medications on File Prior to Visit  Medication Sig Dispense Refill   acetaminophen (TYLENOL) 650 MG CR tablet Take 650-1,300 mg by mouth 2 (two) times daily as needed for pain.     ADVAIR DISKUS 250-50 MCG/DOSE AEPB USE 1 INHALATION BY MOUTH  TWICE DAILY , RINSE MOUTH  AFTER USE 180 each 3   albuterol (VENTOLIN HFA) 108 (90 Base) MCG/ACT inhaler Inhale 2 puffs every 6 hours if needed for breathing 25.5 g 4   alendronate (FOSAMAX) 70 MG tablet Take 1 tablet (70 mg total) by mouth once a week. 12 tablet 3   aspirin EC 81 MG tablet Take 81 mg by mouth daily.     Azelastine-Fluticasone (DYMISTA) 137-50 MCG/ACT SUSP 1-2 puffs each nostril once or twice daily (Patient taking differently: 1-2 puffs each nostril once or twice daily as needed) 23 g 12   blood glucose meter kit and supplies KIT Dispense based on patient and insurance preference. Use up to four times daily as directed. 1 each 0   Calcium Citrate-Vitamin D (CALCIUM + D PO) Take 1,200 mg by mouth daily.     celecoxib (CELEBREX) 200 MG capsule Take 1 capsule (200 mg total) by mouth daily. (Patient taking differently: Take 200 mg by mouth daily as needed.) 30 capsule 1   cetirizine (ZYRTEC) 10 MG tablet Take 10 mg by mouth daily as needed for allergies.     Cholecalciferol (VITAMIN D) 50 MCG (2000 UT) CAPS Take 2,000 Units by mouth daily.      diclofenac Sodium (VOLTAREN) 1 % GEL Apply topically daily as needed.     ezetimibe (ZETIA) 10 MG tablet TAKE 1 TABLET(10 MG) BY MOUTH DAILY 90 tablet 1   FEROSUL 325 (65 Fe) MG tablet TAKE 1 TABLET(325 MG) BY MOUTH DAILY WITH BREAKFAST 90 tablet 1   glucose blood test strip Use as instructed 100 each 12   icosapent Ethyl (VASCEPA) 1 g capsule Take 2 capsules (2 g total) by mouth 2 (two) times daily. 120 capsule 11   Lancets  MISC 1 each by Does not apply route 2 (two) times daily. 100 each 0   Lidocaine 4 % PTCH Apply 1 patch topically as needed (back pain).     metFORMIN (GLUCOPHAGE-XR) 500 MG 24 hr tablet TAKE 2 TABLETS BY MOUTH DAILY (Patient taking differently: Take 500 mg by mouth 2 (two) times daily with a meal.) 180 tablet 1   Multiple Vitamin (MULTIVITAMIN WITH MINERALS) TABS tablet Take 1 tablet by mouth daily. Women's One a day     Multiple Vitamins-Minerals (PRESERVISION AREDS 2+MULTI VIT PO) Take 1 tablet by mouth in the morning and at bedtime.     mupirocin ointment (BACTROBAN) 2 % Apply 1 application topically 3 (three) times daily. 30 g 3   MYRBETRIQ 50 MG TB24 tablet TAKE 1 TABLET BY MOUTH  DAILY 90 tablet 3   Polyethyl Glycol-Propyl Glycol (SYSTANE OP) Place 1 drop into both eyes daily as needed (dry eyes).     polyethylene glycol (MIRALAX / GLYCOLAX) packet Take 17 g by  mouth daily as needed for mild constipation.      pregabalin (LYRICA) 100 MG capsule TAKE 1 CAPSULE(100 MG) BY MOUTH THREE TIMES DAILY 270 capsule 1   Trolamine Salicylate (ASPERCREME EX) Apply 1 application topically daily as needed (back pain).     No current facility-administered medications on file prior to visit.   She is allergic to statins..  Review of Systems Review of Systems  Constitutional: Negative for activity change, appetite change and fatigue.  HENT: Negative for hearing loss, congestion, tinnitus and ear discharge.  dentist q47mEyes: Negative for visual disturbance (see optho q1y -- vision corrected to 20/20 with glasses).  Respiratory: Negative for cough, chest tightness and shortness of breath.   Cardiovascular: Negative for chest pain, palpitations and leg swelling.  Gastrointestinal: Negative for abdominal pain, diarrhea, constipation and abdominal distention.  Genitourinary: Negative for urgency, frequency, decreased urine volume and difficulty urinating.  Musculoskeletal: Negative for back pain,  arthralgias and gait problem.  Skin: Negative for color change, pallor and rash.  Neurological: Negative for dizziness, light-headedness, numbness and headaches.  Hematological: Negative for adenopathy. Does not bruise/bleed easily.  Psychiatric/Behavioral: Negative for suicidal ideas, confusion, sleep disturbance, self-injury, dysphoric mood, decreased concentration and agitation.      Objective:    BP 124/62 (BP Location: Left Arm, Patient Position: Sitting, Cuff Size: Normal)   Pulse 84   Temp 97.8 F (36.6 C) (Oral)   Resp 18   Ht 5' 3.5" (1.613 m)   Wt 162 lb 12.8 oz (73.8 kg)   BMI 28.39 kg/m  General appearance: alert, cooperative, appears stated age, and no distress Head: Normocephalic, without obvious abnormality, atraumatic Eyes: conjunctivae/corneas clear. PERRL, EOM's intact. Fundi benign. Ears: normal TM's and external ear canals both ears Neck: no adenopathy, no carotid bruit, no JVD, supple, symmetrical, trachea midline, and thyroid not enlarged, symmetric, no tenderness/mass/nodules Back: symmetric, no curvature. ROM normal. No CVA tenderness. Lungs: clear to auscultation bilaterally Heart: regular rate and rhythm, S1, S2 normal, no murmur, click, rub or gallop Abdomen: soft, non-tender; bowel sounds normal; no masses,  no organomegaly Extremities: extremities normal, atraumatic, no cyanosis or edema Pulses: 2+ and symmetric    Assessment:    Healthy female exam.      Plan:    Ghm utd Check labs  See After Visit Summary for Counseling Recommendations   1. Need for influenza vaccination   - Flu Vaccine QUAD High Dose(Fluad)  2. Primary hypertension Well controlled, no changes to meds. Encouraged heart healthy diet such as the DASH diet and exercise as tolerated.   - CBC with Differential/Platelet - Comprehensive metabolic panel  3. Hyperlipidemia, unspecified hyperlipidemia type Encourage heart healthy diet such as MIND or DASH diet, increase  exercise, avoid trans fats, simple carbohydrates and processed foods, consider a krill or fish or flaxseed oil cap daily.   - Comprehensive metabolic panel - Lipid panel  4. Type 2 diabetes mellitus with hyperglycemia, without long-term current use of insulin (HCC) Hgba1c to be checked  minimize simple carbs. Increase exercise as tolerated. Continue current meds  - Hemoglobin A1c .sb 5. Preventative health care See above

## 2021-03-15 NOTE — Patient Instructions (Signed)
Preventive Care 80 Years and Older, Female Preventive care refers to lifestyle choices and visits with your health care provider that can promote health and wellness. This includes: A yearly physical exam. This is also called an annual wellness visit. Regular dental and eye exams. Immunizations. Screening for certain conditions. Healthy lifestyle choices, such as: Eating a healthy diet. Getting regular exercise. Not using drugs or products that contain nicotine and tobacco. Limiting alcohol use. What can I expect for my preventive care visit? Physical exam Your health care provider will check your: Height and weight. These may be used to calculate your BMI (body mass index). BMI is a measurement that tells if you are at a healthy weight. Heart rate and blood pressure. Body temperature. Skin for abnormal spots. Counseling Your health care provider may ask you questions about your: Past medical problems. Family's medical history. Alcohol, tobacco, and drug use. Emotional well-being. Home life and relationship well-being. Sexual activity. Diet, exercise, and sleep habits. History of falls. Memory and ability to understand (cognition). Work and work Statistician. Pregnancy and menstrual history. Access to firearms. What immunizations do I need? Vaccines are usually given at various ages, according to a schedule. Your health care provider will recommend vaccines for you based on your age, medical history, and lifestyle or other factors, such as travel or where you work. What tests do I need? Blood tests Lipid and cholesterol levels. These may be checked every 5 years, or more often depending on your overall health. Hepatitis C test. Hepatitis B test. Screening Lung cancer screening. You may have this screening every year starting at age 80 if you have a 30-pack-year history of smoking and currently smoke or have quit within the past 15 years. Colorectal cancer screening. All  adults should have this screening starting at age 80 and continuing until age 9. Your health care provider may recommend screening at age 80 if you are at increased risk. You will have tests every 1-10 years, depending on your results and the type of screening test. Diabetes screening. This is done by checking your blood sugar (glucose) after you have not eaten for a while (fasting). You may have this done every 1-3 years. Mammogram. This may be done every 1-2 years. Talk with your health care provider about how often you should have regular mammograms. Abdominal aortic aneurysm (AAA) screening. You may need this if you are a current or former smoker. BRCA-related cancer screening. This may be done if you have a family history of breast, ovarian, tubal, or peritoneal cancers. Other tests STD (sexually transmitted disease) testing, if you are at risk. Bone density scan. This is done to screen for osteoporosis. You may have this done starting at age 80. Talk with your health care provider about your test results, treatment options, and if necessary, the need for more tests. Follow these instructions at home: Eating and drinking  Eat a diet that includes fresh fruits and vegetables, whole grains, lean protein, and low-fat dairy products. Limit your intake of foods with high amounts of sugar, saturated fats, and salt. Take vitamin and mineral supplements as recommended by your health care provider. Do not drink alcohol if your health care provider tells you not to drink. If you drink alcohol: Limit how much you have to 0-1 drink a day. Be aware of how much alcohol is in your drink. In the [place] one drink equals one 12 oz bottle of beer (355 mL), one 5 oz glass of wine (148 mL), or one  1 oz glass of hard liquor (44 mL). Lifestyle Take daily care of your teeth and gums. Brush your teeth every morning and night with fluoride toothpaste. Floss one time each day. Stay active. Exercise for at least  30 minutes 5 or more days each week. Do not use any products that contain nicotine or tobacco, such as cigarettes, e-cigarettes, and chewing tobacco. If you need help quitting, ask your health care provider. Do not use drugs. If you are sexually active, practice safe sex. Use a condom or other form of protection in order to prevent STIs (sexually transmitted infections). Talk with your health care provider about taking a low-dose aspirin or statin. Find healthy ways to cope with stress, such as: Meditation, yoga, or listening to music. Journaling. Talking to a trusted person. Spending time with friends and family. Safety Always wear your seat belt while driving or riding in a vehicle. Do not drive: If you have been drinking alcohol. Do not ride with someone who has been drinking. When you are tired or distracted. While texting. Wear a helmet and other protective equipment during sports activities. If you have firearms in your house, make sure you follow all gun safety procedures. What's next? Visit your health care provider once a year for an annual wellness visit. Ask your health care provider how often you should have your eyes and teeth checked. Stay up to date on all vaccines. This information is not intended to replace advice given to you by your health care provider. Make sure you discuss any questions you have with your health care provider. Document Revised: 07/24/2020 Document Reviewed: 05/10/2018 Elsevier Patient Education  2022 [employer].

## 2021-03-16 LAB — COMPREHENSIVE METABOLIC PANEL
ALT: 23 U/L (ref 0–35)
AST: 19 U/L (ref 0–37)
Albumin: 3.9 g/dL (ref 3.5–5.2)
Alkaline Phosphatase: 66 U/L (ref 39–117)
BUN: 16 mg/dL (ref 6–23)
CO2: 30 mEq/L (ref 19–32)
Calcium: 9.5 mg/dL (ref 8.4–10.5)
Chloride: 104 mEq/L (ref 96–112)
Creatinine, Ser: 0.88 mg/dL (ref 0.40–1.20)
GFR: 62.2 mL/min (ref >60.00)
Glucose, Bld: 124 mg/dL — ABNORMAL HIGH (ref 70–99)
Potassium: 4.6 mEq/L (ref 3.5–5.1)
Sodium: 140 mEq/L (ref 135–145)
Total Bilirubin: 0.5 mg/dL (ref 0.2–1.2)
Total Protein: 6.5 g/dL (ref 6.0–8.3)

## 2021-03-16 LAB — LDL CHOLESTEROL, DIRECT: Direct LDL: 55 mg/dL

## 2021-03-16 LAB — CBC WITH DIFFERENTIAL/PLATELET
Basophils Absolute: 0.1 10*3/uL (ref 0.0–0.1)
Basophils Relative: 0.9 % (ref 0.0–3.0)
Eosinophils Absolute: 0.2 10*3/uL (ref 0.0–0.7)
Eosinophils Relative: 2.7 % (ref 0.0–5.0)
HCT: 34.7 % — ABNORMAL LOW (ref 36.0–46.0)
Hemoglobin: 12 g/dL (ref 12.0–15.0)
Lymphocytes Relative: 27.4 % (ref 12.0–46.0)
Lymphs Abs: 1.9 10*3/uL (ref 0.7–4.0)
MCHC: 34.7 g/dL (ref 30.0–36.0)
MCV: 91.6 fl (ref 78.0–100.0)
Monocytes Absolute: 0.5 10*3/uL (ref 0.1–1.0)
Monocytes Relative: 6.6 % (ref 3.0–12.0)
Neutro Abs: 4.4 10*3/uL (ref 1.4–7.7)
Neutrophils Relative %: 62.4 % (ref 43.0–77.0)
Platelets: 194 10*3/uL (ref 150.0–400.0)
RBC: 3.79 Mil/uL — ABNORMAL LOW (ref 3.87–5.11)
RDW: 15.4 % (ref 11.5–15.5)
WBC: 7 10*3/uL (ref 4.0–10.5)

## 2021-03-16 LAB — LIPID PANEL
Cholesterol: 218 mg/dL — ABNORMAL HIGH (ref 0–200)
HDL: 42.7 mg/dL (ref >39.00)
Total CHOL/HDL Ratio: 5
Triglycerides: 511 mg/dL — ABNORMAL HIGH (ref 0.0–149.0)

## 2021-03-16 LAB — HEMOGLOBIN A1C: Hgb A1c MFr Bld: 7 % — ABNORMAL HIGH (ref 4.6–6.5)

## 2021-03-18 ENCOUNTER — Telehealth: Payer: Self-pay | Admitting: Internal Medicine

## 2021-03-18 NOTE — Telephone Encounter (Signed)
Left message to call back  Dr. Debara Pickett has an opening 03/19/21 @ 9:30am at [facility] for a visit OR Thursday 05/13/21 @ 8am at same location   Can also double book on 04/20/21 @ 9:30am with Dr. Debara Pickett at [facility] (appt time for 30 mins)

## 2021-03-18 NOTE — Telephone Encounter (Signed)
-----   Message from Pixie Casino, MD sent at 03/18/2021  9:00 AM EDT ----- Thanks .. they have been a work in progress. W'e'll reach out and try to get her to get her back in the office or virtual visit before February.  -[name]  ----- Message ----- From: [name], DO Sent: 03/17/2021   9:15 PM EDT To: Pixie Casino, MD, [facility] [place]  Triglycerides are elevated--- will forward to Dr Debara Pickett at your request  A1c stable  Recheck 6 months

## 2021-03-18 NOTE — Telephone Encounter (Signed)
Patient returned call. She accepted appt 03/19/21 @ 11:30am with Dr. Debara Pickett at [facility]. She is familiar with location

## 2021-03-19 ENCOUNTER — Ambulatory Visit (HOSPITAL_BASED_OUTPATIENT_CLINIC_OR_DEPARTMENT_OTHER): Payer: Medicare Other | Admitting: Internal Medicine

## 2021-03-19 ENCOUNTER — Other Ambulatory Visit: Payer: Self-pay

## 2021-03-19 VITALS — BP 144/72 | HR 59 | Ht 63.5 in | Wt 164.0 lb

## 2021-03-19 DIAGNOSIS — I779 Disorder of arteries and arterioles, unspecified: Secondary | ICD-10-CM

## 2021-03-19 DIAGNOSIS — E782 Mixed hyperlipidemia: Secondary | ICD-10-CM

## 2021-03-19 DIAGNOSIS — E119 Type 2 diabetes mellitus without complications: Secondary | ICD-10-CM | POA: Diagnosis not present

## 2021-03-19 DIAGNOSIS — I2510 Atherosclerotic heart disease of native coronary artery without angina pectoris: Secondary | ICD-10-CM | POA: Diagnosis not present

## 2021-03-19 NOTE — Progress Notes (Signed)
LIPID CLINIC CONSULT NOTE  Chief Complaint:  Follow-up dyslipidemia  Primary Care Physician: Carollee Herter, Alferd Apa, DO  Primary Cardiologist:  None  HPI:  [name] is a 80 y.o. female who is being seen today for the evaluation of dyslipidemia at the request of [name], *. This is a 54-year-old female kindly referred for evaluation management of dyslipidemia.  She has a history of type 2 diabetes, dyslipidemia, mild carotid artery disease and coronary artery calcification seen previously on a CT scan.  She has a twin sister who apparently has coronary disease and has had 3 stents.  Has any history of prior cardiac events or any symptoms such as angina, dyspnea on exertion or decreased exercise tolerance.  Recently she has been residing at [facility].  She says that she eats "very well" there.  Her recent labs show an increase in triglycerides specifically that are quite high at 604, total cholesterol 222 and a direct LDL of 61.  Hemoglobin A1c is 7.1.  She unfortunately is intolerant of simvastatin which she took previously and caused her muscle weakness however at the time she had significant scoliosis and subsequently underwent spinal surgery.  She has not taken any additional statins.  She is currently on ezetimibe.  This is well-tolerated.  She has occasionally taken over-the-counter fish oil but not regularly.  Diet is variable but she does probably get a decent amount of sweets and baked goods.  01/12/2021  [name] is seen today in follow-up.  She has had a significant improvement in her triglycerides from over 600 down to 337, total cholesterol 222 2 and LDL 121.  Direct LDL was measured at 99 however this indicates an increase compared to her previous values.  After discussing with her a little bit she told me that she actually had stopped her ezetimibe and was confused as to whether or not she was supposed to continue that and the Vascepa or the Vascepa was to replace  that.  I informed her that she should have stayed on the ezetimibe.  She does report some recent improvement in her A1c.  She is trying to be more active.  03/19/2021  [name] returns today for follow-up.  Unfortunately she says her diet recently has been not ideal.  Her PCP had reassessed her lipids and shows an increase in her cholesterol.  Triglycerides specifically are now up to 511 with total cholesterol 218 and a direct LDL of 55.  She had restarted her ezetimibe which seems to have helped with her LDL however triglycerides are again high.  She reports compliance with Vascepa.  Her activity level has gone down.  She reports her twin sister is actually hospitalized currently and may have heart catheterization today.  PMHx:  Past Medical History:  Diagnosis Date   Anemia    Arthritis    Asthma    adult onset   Basal cell cancer    LUE; Porokeratosis also   Chronic kidney disease 1963   strep in kidney due to strep throat-hospitalized 10 days   Complication of anesthesia    small trachea   Diabetes mellitus 2010   A1c 6.7%   DVT (deep venous thrombosis) ([place]) 2006   post immobilization post cns surgery   GERD (gastroesophageal reflux disease)    very mild   Granulomatous lung disease ([place]) 2002   incidental Xray finding   Hyperlipidemia    Hypertension 2004   Hypertensive response on Stress Test  Paralysis ([place]) 2006   post cervical fusion with spinal sac tear  with hematoma    PTE (pulmonary thromboembolism) ([place]) 2006    Past Surgical History:  Procedure Laterality Date   ABDOMINAL EXPOSURE N/A 08/12/2016   Procedure: ABDOMINAL EXPOSURE;  Surgeon: Rosetta Posner, MD;  Location: [place];  Service: Vascular;  Laterality: N/A;   ANTERIOR LAT LUMBAR FUSION N/A 08/12/2016   Procedure: LUMBAR TWO-THREE, LUMBAR THREE-FOUR, LUMBAR FOUR-FIVE  ANTEROLATERAL LUMBAR INTERBODY FUSION;  Surgeon: Erline Levine, MD;  Location: [place];  Service: Neurosurgery;  Laterality: N/A;  L2-3 L3-4 L4-5  Anterolateral lumbar interbody fusion   ANTERIOR LUMBAR FUSION N/A 08/12/2016   Procedure: Lumbar Five-Sacral One Anterior lumbar interbody fusion with Dr. Sherren Mocha Early to assist;  Surgeon: Erline Levine, MD;  Location: [place];  Service: Neurosurgery;  Laterality: N/A;  L5-S1 Anterior lumbar interbody fusion with Dr. Sherren Mocha Early to assist   BUNIONECTOMY     CATARACT EXTRACTION Right 12/31/2018   CATARACT EXTRACTION Left 12/17/2018   CERVICAL FUSION  2006   Dr Annette Stable, NS;post op hematoma & cns leak & urinary retention   COLONOSCOPY  1992 & 2002   negative   epidural steroids  2006   cervical spine   LUMBAR PERCUTANEOUS PEDICLE SCREW 4 LEVEL N/A 08/12/2016   Procedure: LUMBAR TWO-SACRAL ONE Percuataneous Pedicle Screws;  Surgeon: Erline Levine, MD;  Location: [place];  Service: Neurosurgery;  Laterality: N/A;   ROTATOR CUFF REPAIR  2009   Right   SEPTOPLASTY     TONSILLECTOMY  80 years old   TOTAL HIP ARTHROPLASTY  06/01/2012   Procedure: TOTAL HIP ARTHROPLASTY ANTERIOR APPROACH;  Surgeon: Mcarthur Rossetti, MD;  Location: [facility];  Service: Orthopedics;  Laterality: Left;  Left Total Hip Arthroplasty   TOTAL HIP ARTHROPLASTY Right 02/23/2018   Procedure: RIGHT TOTAL HIP ARTHROPLASTY ANTERIOR APPROACH;  Surgeon: Mcarthur Rossetti, MD;  Location: [facility];  Service: Orthopedics;  Laterality: Right;   TUBAL LIGATION      FAMHx:  Family History  Problem Relation Age of Onset   COPD Father        emphysema   Cancer Mother        cns cancer   Diabetes Sister        TWIN sister ; also Fibromyalgia ; S/P stent 2004   Heart disease Sister        stents @ 16 & 62   Stroke Maternal Grandmother        in  late 47s   Transient ischemic attack Paternal Aunt     SOCHx:   reports that she has never smoked. She has never used smokeless tobacco. She reports current alcohol use of about 1.0 standard drink per week. She reports that she does not use drugs.  ALLERGIES:  Allergies  Allergen Reactions    Statins Other (See Comments)    Myalgias and muscle weakness    ROS: Pertinent items noted in HPI and remainder of comprehensive ROS otherwise negative.  HOME MEDS: Current Outpatient Medications on File Prior to Visit  Medication Sig Dispense Refill   acetaminophen (TYLENOL) 650 MG CR tablet Take 650-1,300 mg by mouth 2 (two) times daily as needed for pain.     ADVAIR DISKUS 250-50 MCG/DOSE AEPB USE 1 INHALATION BY MOUTH  TWICE DAILY , RINSE MOUTH  AFTER USE 180 each 3   albuterol (VENTOLIN HFA) 108 (90 Base) MCG/ACT inhaler Inhale 2 puffs every 6 hours if needed for breathing 25.5  g 4   alendronate (FOSAMAX) 70 MG tablet Take 1 tablet (70 mg total) by mouth once a week. 12 tablet 3   aspirin EC 81 MG tablet Take 81 mg by mouth daily.     Azelastine-Fluticasone (DYMISTA) 137-50 MCG/ACT SUSP 1-2 puffs each nostril once or twice daily (Patient taking differently: 1-2 puffs each nostril once or twice daily as needed) 23 g 12   blood glucose meter kit and supplies KIT Dispense based on patient and insurance preference. Use up to four times daily as directed. 1 each 0   Calcium Citrate-Vitamin D (CALCIUM + D PO) Take 1,200 mg by mouth daily.     celecoxib (CELEBREX) 200 MG capsule Take 1 capsule (200 mg total) by mouth daily. (Patient taking differently: Take 200 mg by mouth daily as needed.) 30 capsule 1   cetirizine (ZYRTEC) 10 MG tablet Take 10 mg by mouth daily as needed for allergies.     Cholecalciferol (VITAMIN D) 50 MCG (2000 UT) CAPS Take 2,000 Units by mouth daily.      diclofenac Sodium (VOLTAREN) 1 % GEL Apply topically daily as needed.     ezetimibe (ZETIA) 10 MG tablet TAKE 1 TABLET(10 MG) BY MOUTH DAILY 90 tablet 1   FEROSUL 325 (65 Fe) MG tablet TAKE 1 TABLET(325 MG) BY MOUTH DAILY WITH BREAKFAST 90 tablet 1   glucose blood test strip Use as instructed 100 each 12   icosapent Ethyl (VASCEPA) 1 g capsule Take 2 capsules (2 g total) by mouth 2 (two) times daily. 120 capsule 11    Lancets MISC 1 each by Does not apply route 2 (two) times daily. 100 each 0   Lidocaine 4 % PTCH Apply 1 patch topically as needed (back pain).     metFORMIN (GLUCOPHAGE-XR) 500 MG 24 hr tablet TAKE 2 TABLETS BY MOUTH DAILY (Patient taking differently: Take 500 mg by mouth 2 (two) times daily with a meal.) 180 tablet 1   Multiple Vitamin (MULTIVITAMIN WITH MINERALS) TABS tablet Take 1 tablet by mouth daily. Women's One a day     Multiple Vitamins-Minerals (PRESERVISION AREDS 2+MULTI VIT PO) Take 1 tablet by mouth in the morning and at bedtime.     mupirocin ointment (BACTROBAN) 2 % Apply 1 application topically 3 (three) times daily. 30 g 3   MYRBETRIQ 50 MG TB24 tablet TAKE 1 TABLET BY MOUTH  DAILY 90 tablet 3   Polyethyl Glycol-Propyl Glycol (SYSTANE OP) Place 1 drop into both eyes daily as needed (dry eyes).     polyethylene glycol (MIRALAX / GLYCOLAX) packet Take 17 g by mouth daily as needed for mild constipation.      pregabalin (LYRICA) 100 MG capsule TAKE 1 CAPSULE(100 MG) BY MOUTH THREE TIMES DAILY 270 capsule 1   traMADol (ULTRAM) 50 MG tablet Take 1 tablet by mouth as needed.     Trolamine Salicylate (ASPERCREME EX) Apply 1 application topically daily as needed (back pain).     No current facility-administered medications on file prior to visit.    LABS/IMAGING: No results found for this or any previous visit (from the past 48 hour(s)). No results found.  LIPID PANEL:    Component Value Date/Time   CHOL 218 (H) 03/15/2021 1418   CHOL 222 (H) 01/05/2021 0938   TRIG (H) 03/15/2021 1418    511.0 Triglyceride is over 400; calculations on Lipids are invalid.   HDL 42.70 03/15/2021 1418   HDL 42 01/05/2021 0938   CHOLHDL 5 03/15/2021 1418  VLDL 61.0 (H) 01/28/2019 1425   LDLCALC 121 (H) 01/05/2021 0938   LDLDIRECT 55.0 03/15/2021 1418    WEIGHTS: Wt Readings from Last 3 Encounters:  03/19/21 164 lb (74.4 kg)  03/15/21 162 lb 12.8 oz (73.8 kg)  01/12/21 163 lb (73.9 kg)     VITALS: BP (!) 144/72   Pulse (!) 59   Ht 5' 3.5" (1.613 m)   Wt 164 lb (74.4 kg)   SpO2 97%   BMI 28.60 kg/m   EXAM: Deferred    EKG: Deferred  ASSESSMENT: Mixed dyslipidemia with high triglycerides Type 2 diabetes-A1c 7.1 Mild carotid artery disease Coronary artery calcification on CT scan Family history of coronary artery disease in her twin sister  PLAN: 1.   Ms. Puerto has had significant recent increase in her triglycerides.  This is primarily dietary.  She reports compliance with her medications.  Direct LDL 55.  She is in a triglyceride range which would increase her risk for pancreatitis.  We discussed the importance of lowering saturated fats and making significant dietary changes.  She would like to work on more lifestyle changes and exercise with plan to repeat lipids in early February and follow-up with me as previously scheduled at that time.  If her triglycerides are persistently elevated, we could consider adding fenofibrate.  Pixie Casino, MD, [facility], [place] Director of the Advanced Lipid Disorders &  Cardiovascular Risk Reduction Clinic Diplomate of the American Board of Clinical Lipidology Attending Cardiologist  Direct Dial: [phone]  Fax: [phone]  Website:  [email or web address]  03/19/2021, 12:00 PM

## 2021-03-19 NOTE — Patient Instructions (Addendum)
Medication Instructions:  Your physician recommends that you continue on your current medications as directed. Please refer to the Current Medication list given to you today.  *If you need a refill on your cardiac medications before your next appointment, please call your pharmacy*   Lab Work: FASTING lipid panel to check cholesterol -- complete about 1 week before your next visit  If you have labs (blood work) drawn today and your tests are completely normal, you will receive your results only by: [place] (if you have MyChart) OR A paper copy in the mail If you have any lab test that is abnormal or we need to change your treatment, we will call you to review the results.   Testing/Procedures: NONE   Follow-Up: At [facility], you and your health needs are our priority.  As part of our continuing mission to provide you with exceptional heart care, we have created designated Provider Care Teams.  These Care Teams include your primary Cardiologist (physician) and Advanced Practice Providers (APPs -  Physician Assistants and Nurse Practitioners) who all work together to provide you with the care you need, when you need it.  We recommend signing up for the patient portal called "MyChart".  Sign up information is provided on this After Visit Summary.  MyChart is used to connect with patients for Virtual Visits (Telemedicine).  Patients are able to view lab/test results, encounter notes, upcoming appointments, etc.  Non-urgent messages can be sent to your provider as well.   To learn more about what you can do with MyChart, go to [email or web address].    Your next appointment:   February 2023 with Dr. Debara Pickett

## 2021-04-01 DIAGNOSIS — M5416 Radiculopathy, lumbar region: Secondary | ICD-10-CM | POA: Diagnosis not present

## 2021-04-05 ENCOUNTER — Other Ambulatory Visit: Payer: Self-pay | Admitting: Family Medicine

## 2021-04-05 DIAGNOSIS — D509 Iron deficiency anemia, unspecified: Secondary | ICD-10-CM

## 2021-04-20 DIAGNOSIS — L820 Inflamed seborrheic keratosis: Secondary | ICD-10-CM | POA: Diagnosis not present

## 2021-04-20 DIAGNOSIS — Z85828 Personal history of other malignant neoplasm of skin: Secondary | ICD-10-CM | POA: Diagnosis not present

## 2021-04-20 DIAGNOSIS — L570 Actinic keratosis: Secondary | ICD-10-CM | POA: Diagnosis not present

## 2021-04-20 DIAGNOSIS — L565 Disseminated superficial actinic porokeratosis (DSAP): Secondary | ICD-10-CM | POA: Diagnosis not present

## 2021-04-20 DIAGNOSIS — L821 Other seborrheic keratosis: Secondary | ICD-10-CM | POA: Diagnosis not present

## 2021-04-26 ENCOUNTER — Other Ambulatory Visit: Payer: Self-pay | Admitting: Family Medicine

## 2021-04-26 DIAGNOSIS — E1140 Type 2 diabetes mellitus with diabetic neuropathy, unspecified: Secondary | ICD-10-CM

## 2021-04-26 NOTE — Telephone Encounter (Signed)
Requesting: Lyrica 100mg   Contract: 12/11/2017 UDS: 12/11/2017 Last Visit: 03/15/2021 Next Visit: 09/13/2021  Last Refill: 11/05/2020 #270 and 1RF  Please Advise

## 2021-04-29 DIAGNOSIS — M5416 Radiculopathy, lumbar region: Secondary | ICD-10-CM | POA: Diagnosis not present

## 2021-04-29 DIAGNOSIS — R030 Elevated blood-pressure reading, without diagnosis of hypertension: Secondary | ICD-10-CM | POA: Diagnosis not present

## 2021-04-29 DIAGNOSIS — M48062 Spinal stenosis, lumbar region with neurogenic claudication: Secondary | ICD-10-CM | POA: Diagnosis not present

## 2021-04-30 ENCOUNTER — Encounter (HOSPITAL_BASED_OUTPATIENT_CLINIC_OR_DEPARTMENT_OTHER): Payer: Self-pay

## 2021-04-30 ENCOUNTER — Emergency Department (HOSPITAL_BASED_OUTPATIENT_CLINIC_OR_DEPARTMENT_OTHER)
Admission: EM | Admit: 2021-04-30 | Discharge: 2021-05-01 | Disposition: A | Discharge status: Home / Self Care | Payer: Medicare Other | Attending: Emergency Medicine | Admitting: Emergency Medicine

## 2021-04-30 ENCOUNTER — Other Ambulatory Visit: Payer: Self-pay

## 2021-04-30 ENCOUNTER — Emergency Department (HOSPITAL_BASED_OUTPATIENT_CLINIC_OR_DEPARTMENT_OTHER): Payer: Medicare Other

## 2021-04-30 DIAGNOSIS — Y92410 Unspecified street and highway as the place of occurrence of the external cause: Secondary | ICD-10-CM | POA: Diagnosis not present

## 2021-04-30 DIAGNOSIS — Z79899 Other long term (current) drug therapy: Secondary | ICD-10-CM | POA: Insufficient documentation

## 2021-04-30 DIAGNOSIS — J4520 Mild intermittent asthma, uncomplicated: Secondary | ICD-10-CM | POA: Insufficient documentation

## 2021-04-30 DIAGNOSIS — Z23 Encounter for immunization: Secondary | ICD-10-CM | POA: Insufficient documentation

## 2021-04-30 DIAGNOSIS — S8992XA Unspecified injury of left lower leg, initial encounter: Secondary | ICD-10-CM | POA: Diagnosis present

## 2021-04-30 DIAGNOSIS — E1140 Type 2 diabetes mellitus with diabetic neuropathy, unspecified: Secondary | ICD-10-CM | POA: Diagnosis not present

## 2021-04-30 DIAGNOSIS — Z7984 Long term (current) use of oral hypoglycemic drugs: Secondary | ICD-10-CM | POA: Diagnosis not present

## 2021-04-30 DIAGNOSIS — S81012A Laceration without foreign body, left knee, initial encounter: Secondary | ICD-10-CM | POA: Diagnosis not present

## 2021-04-30 DIAGNOSIS — S40021A Contusion of right upper arm, initial encounter: Secondary | ICD-10-CM | POA: Insufficient documentation

## 2021-04-30 DIAGNOSIS — S3991XA Unspecified injury of abdomen, initial encounter: Secondary | ICD-10-CM | POA: Diagnosis not present

## 2021-04-30 DIAGNOSIS — T07XXXA Unspecified multiple injuries, initial encounter: Secondary | ICD-10-CM

## 2021-04-30 DIAGNOSIS — Z96643 Presence of artificial hip joint, bilateral: Secondary | ICD-10-CM | POA: Diagnosis not present

## 2021-04-30 DIAGNOSIS — R1032 Left lower quadrant pain: Secondary | ICD-10-CM | POA: Diagnosis not present

## 2021-04-30 DIAGNOSIS — R519 Headache, unspecified: Secondary | ICD-10-CM | POA: Diagnosis not present

## 2021-04-30 DIAGNOSIS — Z7982 Long term (current) use of aspirin: Secondary | ICD-10-CM | POA: Diagnosis not present

## 2021-04-30 DIAGNOSIS — R1033 Periumbilical pain: Secondary | ICD-10-CM | POA: Diagnosis not present

## 2021-04-30 DIAGNOSIS — Z85828 Personal history of other malignant neoplasm of skin: Secondary | ICD-10-CM | POA: Diagnosis not present

## 2021-04-30 DIAGNOSIS — M542 Cervicalgia: Secondary | ICD-10-CM | POA: Diagnosis not present

## 2021-04-30 DIAGNOSIS — I129 Hypertensive chronic kidney disease with stage 1 through stage 4 chronic kidney disease, or unspecified chronic kidney disease: Secondary | ICD-10-CM | POA: Diagnosis not present

## 2021-04-30 DIAGNOSIS — R0789 Other chest pain: Secondary | ICD-10-CM | POA: Insufficient documentation

## 2021-04-30 DIAGNOSIS — E1122 Type 2 diabetes mellitus with diabetic chronic kidney disease: Secondary | ICD-10-CM | POA: Insufficient documentation

## 2021-04-30 DIAGNOSIS — N189 Chronic kidney disease, unspecified: Secondary | ICD-10-CM | POA: Insufficient documentation

## 2021-04-30 DIAGNOSIS — S2231XA Fracture of one rib, right side, initial encounter for closed fracture: Secondary | ICD-10-CM | POA: Diagnosis not present

## 2021-04-30 LAB — BASIC METABOLIC PANEL
Anion gap: 9 (ref 5–15)
BUN: 18 mg/dL (ref 8–23)
CO2: 25 mmol/L (ref 22–32)
Calcium: 9.5 mg/dL (ref 8.9–10.3)
Chloride: 105 mmol/L (ref 98–111)
Creatinine, Ser: 0.82 mg/dL (ref 0.44–1.00)
GFR, Estimated: >60 mL/min (ref >60)
Glucose, Bld: 124 mg/dL — ABNORMAL HIGH (ref 70–99)
Potassium: 5.7 mmol/L — ABNORMAL HIGH (ref 3.5–5.1)
Sodium: 139 mmol/L (ref 135–145)

## 2021-04-30 MED ORDER — TETANUS-DIPHTH-ACELL PERTUSSIS 5-2.5-18.5 LF-MCG/0.5 IM SUSY
0.5000 mL | PREFILLED_SYRINGE | Freq: Once | INTRAMUSCULAR | Status: AC
  Administered 2021-04-30: 0.5 mL via INTRAMUSCULAR
  Filled 2021-04-30: qty 0.5

## 2021-04-30 MED ORDER — IOHEXOL 300 MG/ML  SOLN
100.0000 mL | Freq: Once | INTRAMUSCULAR | Status: AC | PRN
  Administered 2021-04-30: 100 mL via INTRAVENOUS
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
## 2021-05-01 MED ORDER — HYDROCODONE-ACETAMINOPHEN 5-325 MG PO TABS: 0.5000 | ORAL_TABLET | ORAL | 0 refills | Status: DC | PRN

## 2021-05-01 MED ORDER — HYDROCODONE-ACETAMINOPHEN 5-325 MG PO TABS
1.0000 | ORAL_TABLET | Freq: Once | ORAL | Status: AC
  Administered 2021-05-01: 1 via ORAL
  Filled 2021-05-01: qty 1

## 2021-05-01 NOTE — ED Notes (Signed)
Pt discharged home after verbalizing understanding of discharge instructions; nad noted. 
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
## 2021-05-03 ENCOUNTER — Ambulatory Visit (HOSPITAL_BASED_OUTPATIENT_CLINIC_OR_DEPARTMENT_OTHER): Payer: Medicare Other

## 2021-05-07 ENCOUNTER — Ambulatory Visit (INDEPENDENT_AMBULATORY_CARE_PROVIDER_SITE_OTHER): Payer: Medicare Other | Admitting: Family Medicine

## 2021-05-07 ENCOUNTER — Encounter: Payer: Self-pay | Admitting: Family Medicine

## 2021-05-07 ENCOUNTER — Other Ambulatory Visit: Payer: Self-pay

## 2021-05-07 ENCOUNTER — Ambulatory Visit (HOSPITAL_BASED_OUTPATIENT_CLINIC_OR_DEPARTMENT_OTHER)
Admission: RE | Admit: 2021-05-07 | Discharge: 2021-05-07 | Disposition: A | Discharge status: Home / Self Care | Payer: Medicare Other | Source: Ambulatory Visit | Attending: Family Medicine | Admitting: Family Medicine

## 2021-05-07 VITALS — BP 120/60 | HR 72 | Temp 98.1°F | Resp 20 | Ht 63.5 in | Wt 159.8 lb

## 2021-05-07 DIAGNOSIS — R071 Chest pain on breathing: Secondary | ICD-10-CM

## 2021-05-07 DIAGNOSIS — N3281 Overactive bladder: Secondary | ICD-10-CM | POA: Diagnosis not present

## 2021-05-07 DIAGNOSIS — R079 Chest pain, unspecified: Secondary | ICD-10-CM | POA: Diagnosis not present

## 2021-05-07 MED ORDER — MIRABEGRON ER 50 MG PO TB24: 50.0000 mg | ORAL_TABLET | Freq: Every day | ORAL | 3 refills | Status: DC

## 2021-05-07 MED ORDER — HYDROCODONE-ACETAMINOPHEN 5-325 MG PO TABS: 0.5000 | ORAL_TABLET | ORAL | 0 refills | Status: DC | PRN
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
## 2021-05-07 NOTE — Assessment & Plan Note (Signed)
Re check cxr  Check d dimer  Go to er if worsens Pt has muscle relaxer and pain med at home if needed
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
## 2021-05-07 NOTE — Assessment & Plan Note (Signed)
Pt has muscle relaxer and pain med from hosp

## 2021-05-08 LAB — CBC WITH DIFFERENTIAL/PLATELET
Absolute Monocytes: 672 cells/uL (ref 200–950)
Basophils Absolute: 38 cells/uL (ref 0–200)
Basophils Relative: 0.4 %
Eosinophils Absolute: 202 cells/uL (ref 15–500)
Eosinophils Relative: 2.1 %
HCT: 33.7 % — ABNORMAL LOW (ref 35.0–45.0)
Hemoglobin: 11.1 g/dL — ABNORMAL LOW (ref 11.7–15.5)
Lymphs Abs: 1939 cells/uL (ref 850–3900)
MCH: 28.8 pg (ref 27.0–33.0)
MCHC: 32.9 g/dL (ref 32.0–36.0)
MCV: 87.5 fL (ref 80.0–100.0)
MPV: 11.3 fL (ref 7.5–12.5)
Monocytes Relative: 7 %
Neutro Abs: 6749 cells/uL (ref 1500–7800)
Neutrophils Relative %: 70.3 %
Platelets: 251 10*3/uL (ref 140–400)
RBC: 3.85 10*6/uL (ref 3.80–5.10)
RDW: 13.9 % (ref 11.0–15.0)
Total Lymphocyte: 20.2 %
WBC: 9.6 10*3/uL (ref 3.8–10.8)

## 2021-05-08 LAB — COMPREHENSIVE METABOLIC PANEL
AG Ratio: 1.5 (calc) (ref 1.0–2.5)
ALT: 14 U/L (ref 6–29)
AST: 13 U/L (ref 10–35)
Albumin: 3.8 g/dL (ref 3.6–5.1)
Alkaline phosphatase (APISO): 62 U/L (ref 37–153)
BUN/Creatinine Ratio: 18 (calc) (ref 6–22)
BUN: 17 mg/dL (ref 7–25)
CO2: 24 mmol/L (ref 20–32)
Calcium: 9.4 mg/dL (ref 8.6–10.4)
Chloride: 105 mmol/L (ref 98–110)
Creat: 0.96 mg/dL — ABNORMAL HIGH (ref 0.60–0.95)
Globulin: 2.6 g/dL (calc) (ref 1.9–3.7)
Glucose, Bld: 141 mg/dL — ABNORMAL HIGH (ref 65–99)
Potassium: 4.3 mmol/L (ref 3.5–5.3)
Sodium: 140 mmol/L (ref 135–146)
Total Bilirubin: 0.3 mg/dL (ref 0.2–1.2)
Total Protein: 6.4 g/dL (ref 6.1–8.1)

## 2021-05-08 LAB — D-DIMER, QUANTITATIVE: D-Dimer, Quant: 1.45 mcg/mL FEU — ABNORMAL HIGH (ref <0.50)

## 2021-05-09 ENCOUNTER — Other Ambulatory Visit: Payer: Self-pay | Admitting: Family Medicine

## 2021-05-09 DIAGNOSIS — R0789 Other chest pain: Secondary | ICD-10-CM

## 2021-05-09 DIAGNOSIS — R0602 Shortness of breath: Secondary | ICD-10-CM

## 2021-05-09 DIAGNOSIS — R7989 Other specified abnormal findings of blood chemistry: Secondary | ICD-10-CM

## 2021-05-11 ENCOUNTER — Ambulatory Visit (HOSPITAL_BASED_OUTPATIENT_CLINIC_OR_DEPARTMENT_OTHER)
Admission: RE | Admit: 2021-05-11 | Discharge: 2021-05-11 | Disposition: A | Discharge status: Home / Self Care | Payer: Medicare Other | Source: Ambulatory Visit | Attending: Family Medicine | Admitting: Family Medicine

## 2021-05-11 ENCOUNTER — Telehealth: Payer: Self-pay | Admitting: Family Medicine

## 2021-05-11 ENCOUNTER — Other Ambulatory Visit: Payer: Self-pay | Admitting: Family Medicine

## 2021-05-11 ENCOUNTER — Encounter (HOSPITAL_BASED_OUTPATIENT_CLINIC_OR_DEPARTMENT_OTHER): Payer: Self-pay

## 2021-05-11 ENCOUNTER — Other Ambulatory Visit: Payer: Self-pay

## 2021-05-11 DIAGNOSIS — R0609 Other forms of dyspnea: Secondary | ICD-10-CM

## 2021-05-11 DIAGNOSIS — R7989 Other specified abnormal findings of blood chemistry: Secondary | ICD-10-CM | POA: Insufficient documentation

## 2021-05-11 DIAGNOSIS — J810 Acute pulmonary edema: Secondary | ICD-10-CM

## 2021-05-11 DIAGNOSIS — R0789 Other chest pain: Secondary | ICD-10-CM | POA: Insufficient documentation

## 2021-05-11 DIAGNOSIS — R0602 Shortness of breath: Secondary | ICD-10-CM

## 2021-05-11 MED ORDER — IOHEXOL 350 MG/ML SOLN
100.0000 mL | Freq: Once | INTRAVENOUS | Status: AC | PRN
  Administered 2021-05-11: 75 mL via INTRAVENOUS

## 2021-05-11 NOTE — Telephone Encounter (Signed)
Pt called to go over lab results.

## 2021-05-11 NOTE — Telephone Encounter (Signed)
Spoke with patient. Pt verbalized understanding. Pt will schedule ECHO

## 2021-05-19 ENCOUNTER — Other Ambulatory Visit: Payer: Self-pay | Admitting: Family Medicine
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
## 2021-05-26 ENCOUNTER — Other Ambulatory Visit: Payer: Self-pay

## 2021-05-26 ENCOUNTER — Ambulatory Visit (INDEPENDENT_AMBULATORY_CARE_PROVIDER_SITE_OTHER): Payer: Medicare Other

## 2021-05-26 ENCOUNTER — Encounter: Payer: Self-pay | Admitting: Internal Medicine

## 2021-05-26 ENCOUNTER — Ambulatory Visit: Payer: Medicare Other | Admitting: Internal Medicine

## 2021-05-26 DIAGNOSIS — K219 Gastro-esophageal reflux disease without esophagitis: Secondary | ICD-10-CM | POA: Insufficient documentation

## 2021-05-26 DIAGNOSIS — R0609 Other forms of dyspnea: Secondary | ICD-10-CM

## 2021-05-26 DIAGNOSIS — J4520 Mild intermittent asthma, uncomplicated: Secondary | ICD-10-CM

## 2021-05-26 DIAGNOSIS — T8859XA Other complications of anesthesia, initial encounter: Secondary | ICD-10-CM | POA: Insufficient documentation

## 2021-05-26 DIAGNOSIS — J90 Pleural effusion, not elsewhere classified: Secondary | ICD-10-CM

## 2021-05-26 LAB — ECHOCARDIOGRAM COMPLETE
AR max vel: 1.31 cm2
AV Area VTI: 1.39 cm2
AV Area mean vel: 1.32 cm2
AV Mean grad: 11 mmHg
AV Peak grad: 21.7 mmHg
Ao pk vel: 2.33 m/s
Area-P 1/2: 3.1 cm2
Calc EF: 65.7 %
S' Lateral: 2.9 cm
Single Plane A2C EF: 69 %
Single Plane A4C EF: 59.2 %
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
## 2021-05-26 NOTE — Patient Instructions (Signed)
We can continue present meds  Please call if we can help 
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
## 2021-06-01 ENCOUNTER — Other Ambulatory Visit: Payer: Self-pay | Admitting: Family Medicine
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
## 2021-06-04 ENCOUNTER — Encounter: Payer: Self-pay | Admitting: Cardiology

## 2021-06-04 ENCOUNTER — Other Ambulatory Visit: Payer: Self-pay

## 2021-06-04 ENCOUNTER — Ambulatory Visit: Payer: Medicare Other | Admitting: Cardiology

## 2021-06-04 VITALS — BP 140/76 | HR 65 | Ht 63.5 in | Wt 158.0 lb

## 2021-06-04 DIAGNOSIS — I5189 Other ill-defined heart diseases: Secondary | ICD-10-CM | POA: Diagnosis not present

## 2021-06-04 DIAGNOSIS — I10 Essential (primary) hypertension: Secondary | ICD-10-CM | POA: Diagnosis not present

## 2021-06-04 DIAGNOSIS — E785 Hyperlipidemia, unspecified: Secondary | ICD-10-CM

## 2021-06-04 DIAGNOSIS — E1151 Type 2 diabetes mellitus with diabetic peripheral angiopathy without gangrene: Secondary | ICD-10-CM | POA: Diagnosis not present

## 2021-06-04 DIAGNOSIS — E1169 Type 2 diabetes mellitus with other specified complication: Secondary | ICD-10-CM | POA: Diagnosis not present

## 2021-06-04 NOTE — Patient Instructions (Signed)

## 2021-06-11 ENCOUNTER — Other Ambulatory Visit: Payer: Self-pay | Admitting: Family Medicine

## 2021-06-11 DIAGNOSIS — M545 Low back pain, unspecified: Secondary | ICD-10-CM

## 2021-06-11 DIAGNOSIS — G8929 Other chronic pain: Secondary | ICD-10-CM

## 2021-06-11 DIAGNOSIS — E1165 Type 2 diabetes mellitus with hyperglycemia: Secondary | ICD-10-CM

## 2021-06-14 ENCOUNTER — Telehealth: Payer: Self-pay | Admitting: Pharmacist

## 2021-06-14 ENCOUNTER — Ambulatory Visit (INDEPENDENT_AMBULATORY_CARE_PROVIDER_SITE_OTHER): Payer: Medicare Other | Admitting: Pharmacist

## 2021-06-14 DIAGNOSIS — M8580 Other specified disorders of bone density and structure, unspecified site: Secondary | ICD-10-CM

## 2021-06-14 DIAGNOSIS — R0989 Other specified symptoms and signs involving the circulatory and respiratory systems: Secondary | ICD-10-CM

## 2021-06-14 DIAGNOSIS — E1165 Type 2 diabetes mellitus with hyperglycemia: Secondary | ICD-10-CM

## 2021-06-14 DIAGNOSIS — E782 Mixed hyperlipidemia: Secondary | ICD-10-CM

## 2021-06-14 NOTE — Patient Instructions (Signed)
[name],  It was a pleasure speaking with you today.  I have attached a summary of our visit today and information about your health goals.   Our next appointment is by telephone on Oct 13, 2021 at 9:30am  Please call the care guide team at [phone] if you need to cancel or reschedule your appointment.   If you have any questions or concerns, please feel free to contact me either at the phone number below or with a MyChart message.   Keep up the good work!  Cherre Robins, PharmD Clinical Pharmacist [facility] [phone] (direct line)  [phone] (main office number)   Patient Goals/Self-Care Activities Over the next 90 days, patient will:  take medications as prescribed, check glucose every other day, document, and provide at future appointments, Increase exercise with a target a minimum of 150 minutes of moderate intensity exercise weekly as able Get bivalent COVID booster Restart aspirin 81mg  - take 1 tablet every OTHER day; monitor for sign of bleeding or bruising.   Patient verbalizes understanding of instructions and care plan provided today and agrees to view in [place]. Active MyChart status confirmed with patient.

## 2021-06-14 NOTE — Telephone Encounter (Signed)
Patient called back later in day and Chronic Care Management visit was completed 06/14/2021

## 2021-06-14 NOTE — Telephone Encounter (Signed)
°  Care Management   Follow Up Note   06/14/2021 Name: [name] MRN: [id number] DOB: Aug 10, 1940   Referred by: [name], DO Reason for referral : Appointment   An unsuccessful telephone outreach was attempted today. The patient was referred to the case management team for assistance with care management and care coordination.   Follow Up Plan: The care management team will reach out to the patient again over the next 30 days.   Cherre Robins, PharmD Clinical Pharmacist [facility]
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
## 2021-06-17 ENCOUNTER — Other Ambulatory Visit: Payer: Self-pay | Admitting: Family Medicine

## 2021-06-17 DIAGNOSIS — E1165 Type 2 diabetes mellitus with hyperglycemia: Secondary | ICD-10-CM

## 2021-06-29 DIAGNOSIS — E1165 Type 2 diabetes mellitus with hyperglycemia: Secondary | ICD-10-CM | POA: Diagnosis not present

## 2021-06-29 DIAGNOSIS — E782 Mixed hyperlipidemia: Secondary | ICD-10-CM | POA: Diagnosis not present

## 2021-07-01 ENCOUNTER — Ambulatory Visit (INDEPENDENT_AMBULATORY_CARE_PROVIDER_SITE_OTHER): Payer: Medicare Other | Admitting: Family Medicine

## 2021-07-01 ENCOUNTER — Encounter: Payer: Self-pay | Admitting: Family Medicine

## 2021-07-01 VITALS — BP 120/70 | HR 63 | Temp 97.9°F | Resp 20 | Ht 63.5 in | Wt 160.0 lb

## 2021-07-01 DIAGNOSIS — M47812 Spondylosis without myelopathy or radiculopathy, cervical region: Secondary | ICD-10-CM

## 2021-07-01 DIAGNOSIS — G8929 Other chronic pain: Secondary | ICD-10-CM

## 2021-07-01 DIAGNOSIS — M545 Low back pain, unspecified: Secondary | ICD-10-CM | POA: Diagnosis not present

## 2021-07-01 MED ORDER — NONFORMULARY OR COMPOUNDED ITEM: 0 refills | Status: DC
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
## 2021-07-05 DIAGNOSIS — M5416 Radiculopathy, lumbar region: Secondary | ICD-10-CM | POA: Diagnosis not present

## 2021-07-09 ENCOUNTER — Ambulatory Visit: Payer: Medicare Other

## 2021-07-09 ENCOUNTER — Ambulatory Visit (INDEPENDENT_AMBULATORY_CARE_PROVIDER_SITE_OTHER): Payer: Medicare Other

## 2021-07-09 VITALS — Ht 63.5 in | Wt 160.0 lb

## 2021-07-09 DIAGNOSIS — Z0000 Encounter for general adult medical examination without abnormal findings: Secondary | ICD-10-CM | POA: Diagnosis not present

## 2021-07-09 NOTE — Progress Notes (Signed)
Subjective:   [name] is a 81 y.o. female who presents for Medicare Annual (Subsequent) preventive examination.  I connected with Lolita today by telephone and verified that I am speaking with the correct person using two identifiers. Location patient: home Location provider: work Persons participating in the virtual visit: patient, Marine scientist.    I discussed the limitations, risks, security and privacy concerns of performing an evaluation and management service by telephone and the availability of in person appointments. I also discussed with the patient that there may be a patient responsible charge related to this service. The patient expressed understanding and verbally consented to this telephonic visit.    Interactive audio and video telecommunications were attempted between this provider and patient, however failed, due to patient having technical difficulties OR patient did not have access to video capability.  We continued and completed visit with audio only.  Some vital signs may be absent or patient reported.   Time Spent with patient on telephone encounter: 30 minutes   Review of Systems     Cardiac Risk Factors include: advanced age (>37mn, >>67women);hypertension;diabetes mellitus;dyslipidemia     Objective:    Today's Vitals   07/09/21 1342  Weight: 160 lb (72.6 kg)  Height: 5' 3.5" (1.613 m)   Body mass index is 27.9 kg/m.  Advanced Directives 07/09/2021 04/30/2021 04/19/2019 07/06/2018 07/06/2018 03/06/2018 02/27/2018  Does Patient Have a Medical Advance Directive? Yes No Yes Yes No Yes Yes  Type of AParamedicof [place]Living will - [place]Living will [place]ar[place]Living will - [place]Forest ParkLiving will H[place] Castle NorthwestLiving will  Does patient want to make changes to medical advance directive? - - No - Patient declined No - Patient declined - No - Patient declined No - Patient  declined  Copy of [place]in Chart? Yes - validated most recent copy scanned in chart (See row information) - Yes - validated most recent copy scanned in chart (See row information) No - copy requested - Yes Yes  Would patient like information on creating a medical advance directive? - No - Patient declined - No - Patient declined No - Patient declined - -  Pre-existing out of facility DNR order (yellow form or pink MOST form) - - - - - - -    Current Medications (verified) Outpatient Encounter Medications as of 07/09/2021  Medication Sig   Accu-Chek Softclix Lancets lancets USE TO TEST BLOOD SUGAR TWICE DAILY AS DIRECTED   ADVAIR DISKUS 250-50 MCG/DOSE AEPB USE 1 INHALATION BY MOUTH  TWICE DAILY , RINSE MOUTH  AFTER USE   albuterol (VENTOLIN HFA) 108 (90 Base) MCG/ACT inhaler Inhale 2 puffs every 6 hours if needed for breathing   alendronate (FOSAMAX) 70 MG tablet TAKE 1 TABLET(70 MG) BY MOUTH 1 TIME A WEEK   aspirin EC 81 MG tablet Take 81 mg by mouth daily.   Azelastine-Fluticasone (DYMISTA) 137-50 MCG/ACT SUSP 1-2 puffs each nostril once or twice daily (Patient taking differently: 1-2 puffs each nostril once or twice daily as needed)   blood glucose meter kit and supplies KIT Dispense based on patient and insurance preference. Use up to four times daily as directed.   Calcium Citrate-Vitamin D (CALCIUM + D PO) Take 1,200 mg by mouth daily.   celecoxib (CELEBREX) 200 MG capsule TAKE 1 CAPSULE(200 MG) BY MOUTH DAILY   cetirizine (ZYRTEC) 10 MG tablet Take 10 mg by mouth daily as needed  for allergies.   Cholecalciferol (VITAMIN D) 50 MCG (2000 UT) CAPS Take 2,000 Units by mouth daily.    diclofenac Sodium (VOLTAREN) 1 % GEL Apply topically daily as needed (Pain).   ezetimibe (ZETIA) 10 MG tablet TAKE 1 TABLET(10 MG) BY MOUTH DAILY   FEROSUL 325 (65 Fe) MG tablet TAKE 1 TABLET(325 MG) BY MOUTH DAILY WITH BREAKFAST   glucose blood test strip Use as instructed   icosapent  Ethyl (VASCEPA) 1 g capsule Take 2 capsules (2 g total) by mouth 2 (two) times daily.   Lidocaine 4 % PTCH Apply 1 patch topically as needed (back pain).   metFORMIN (GLUCOPHAGE-XR) 500 MG 24 hr tablet Take 1 tablet (500 mg total) by mouth 2 (two) times daily with a meal.   mirabegron ER (MYRBETRIQ) 50 MG TB24 tablet Take 1 tablet (50 mg total) by mouth daily.   Multiple Vitamin (MULTIVITAMIN WITH MINERALS) TABS tablet Take 1 tablet by mouth daily. Women's One a day - 50+   Multiple Vitamins-Minerals (PRESERVISION AREDS 2+MULTI VIT PO) Take 1 tablet by mouth in the morning and at bedtime.   mupirocin ointment (BACTROBAN) 2 % Apply 1 application topically 3 (three) times daily.   NONFORMULARY OR COMPOUNDED ITEM Pt for cervical arthritis and low back pain   Polyethyl Glycol-Propyl Glycol (SYSTANE OP) Place 1 drop into both eyes daily as needed (dry eyes).   polyethylene glycol (MIRALAX / GLYCOLAX) packet Take 17 g by mouth daily as needed for mild constipation.    pregabalin (LYRICA) 100 MG capsule TAKE 1 CAPSULE(100 MG) BY MOUTH THREE TIMES DAILY   traMADol (ULTRAM) 50 MG tablet Take 1 tablet by mouth as needed.   Trolamine Salicylate (ASPERCREME EX) Apply 1 application topically daily as needed (back pain).   No facility-administered encounter medications on file as of 07/09/2021.    Allergies (verified) Statins   History: Past Medical History:  Diagnosis Date   Anemia    Arthritis    Asthma    adult onset   Basal cell cancer    LUE; Porokeratosis also   Chronic kidney disease 1963   strep in kidney due to strep throat-hospitalized 10 days   Complication of anesthesia    small trachea   Diabetes mellitus 2010   A1c 6.7%   DVT (deep venous thrombosis) ([place]) 2006   post immobilization post cns surgery   GERD (gastroesophageal reflux disease)    very mild   Granulomatous lung disease ([place]) 2002   incidental Xray finding   Hyperlipidemia    Hypertension 2004   Hypertensive  response on Stress Test   Paralysis ([place]) 2006   post cervical fusion with spinal sac tear  with hematoma    PTE (pulmonary thromboembolism) ([place]) 2006   Past Surgical History:  Procedure Laterality Date   ABDOMINAL EXPOSURE N/A 08/12/2016   Procedure: ABDOMINAL EXPOSURE;  Surgeon: Rosetta Posner, MD;  Location: [facility];  Service: Vascular;  Laterality: N/A;   ANTERIOR LAT LUMBAR FUSION N/A 08/12/2016   Procedure: LUMBAR TWO-THREE, LUMBAR THREE-FOUR, LUMBAR FOUR-FIVE  ANTEROLATERAL LUMBAR INTERBODY FUSION;  Surgeon: Erline Levine, MD;  Location: [place];  Service: Neurosurgery;  Laterality: N/A;  L2-3 L3-4 L4-5 Anterolateral lumbar interbody fusion   ANTERIOR LUMBAR FUSION N/A 08/12/2016   Procedure: Lumbar Five-Sacral One Anterior lumbar interbody fusion with Dr. Sherren Mocha Early to assist;  Surgeon: Erline Levine, MD;  Location: [place];  Service: Neurosurgery;  Laterality: N/A;  L5-S1 Anterior lumbar interbody fusion with Dr. Curt Jews  to assist   BUNIONECTOMY     CATARACT EXTRACTION Right 12/31/2018   CATARACT EXTRACTION Left 12/17/2018   CERVICAL FUSION  2006   Dr Annette Stable, NS;post op hematoma & cns leak & urinary retention   COLONOSCOPY  1992 & 2002   negative   epidural steroids  2006   cervical spine   LUMBAR PERCUTANEOUS PEDICLE SCREW 4 LEVEL N/A 08/12/2016   Procedure: LUMBAR TWO-SACRAL ONE Percuataneous Pedicle Screws;  Surgeon: Erline Levine, MD;  Location: [place];  Service: Neurosurgery;  Laterality: N/A;   ROTATOR CUFF REPAIR  2009   Right   SEPTOPLASTY     TONSILLECTOMY  81 years old   TOTAL HIP ARTHROPLASTY  06/01/2012   Procedure: TOTAL HIP ARTHROPLASTY ANTERIOR APPROACH;  Surgeon: Mcarthur Rossetti, MD;  Location: [facility];  Service: Orthopedics;  Laterality: Left;  Left Total Hip Arthroplasty   TOTAL HIP ARTHROPLASTY Right 02/23/2018   Procedure: RIGHT TOTAL HIP ARTHROPLASTY ANTERIOR APPROACH;  Surgeon: Mcarthur Rossetti, MD;  Location: [facility];  Service: Orthopedics;  Laterality: Right;    TUBAL LIGATION     Family History  Problem Relation Age of Onset   COPD Father        emphysema   Cancer Mother        cns cancer   Diabetes Sister        TWIN sister ; also Fibromyalgia ; S/P stent 2004   Heart disease Sister        stents @ 18 & 84   Stroke Maternal Grandmother        in  late 77s   Transient ischemic attack Paternal Aunt    Social History   Socioeconomic History   Marital status: Divorced    Spouse name: Not on file   Number of children: Not on file   Years of education: Not on file   Highest education level: Not on file  Occupational History   Not on file  Tobacco Use   Smoking status: Never    Passive exposure: Never   Smokeless tobacco: Never  Vaping Use   Vaping Use: Never used  Substance and Sexual Activity   Alcohol use: Yes    Alcohol/week: 1.0 standard drink    Types: 1 Glasses of wine per week    Comment: socially   Drug use: No   Sexual activity: Never  Other Topics Concern   Not on file  Social History Narrative   Not on file   Social Determinants of Health   Financial Resource Strain: Low Risk    Difficulty of Paying Living Expenses: Not hard at all  Food Insecurity: No Food Insecurity   Worried About Charity fundraiser in the Last Year: Never true   [place] in the Last Year: Never true  Transportation Needs: No Transportation Needs   Lack of Transportation (Medical): No   Lack of Transportation (Non-Medical): No  Physical Activity: Insufficiently Active   Days of Exercise per Week: 1 day   Minutes of Exercise per Session: 30 min  Stress: No Stress Concern Present   Feeling of Stress : Not at all  Social Connections: Moderately Isolated   Frequency of Communication with Friends and Family: More than three times a week   Frequency of Social Gatherings with Friends and Family: More than three times a week   Attends Religious Services: Never   Marine scientist or Organizations: Yes   Attends Theatre manager Meetings:  More than 4 times per year   Marital Status: Divorced    Tobacco Counseling Counseling given: Not Answered   Clinical Intake:  Pre-visit preparation completed: No        BMI - recorded: 27.9 Nutritional Status: BMI 25 -29 Overweight Nutritional Risks: None Diabetes: Yes CBG done?: No Did pt. bring in CBG monitor from home?: No  How often do you need to have someone help you when you read instructions, pamphlets, or other written materials from your doctor or pharmacy?: 1 - Never  Diabetes:  Is the patient diabetic?  Yes  If diabetic, was a CBG obtained today?  No  Did the patient bring in their glucometer from home?  No phone visit How often do you monitor your CBG's? daily.   Financial Strains and Diabetes Management:  Are you having any financial strains with the device, your supplies or your medication? No .  Does the patient want to be seen by Chronic Care Management for management of their diabetes?  No  Would the patient like to be referred to a Nutritionist or for Diabetic Management?  No   Diabetic Exams:  Diabetic Eye Exam: Completed 01/13/2021.   Diabetic Foot Exam: Completed 08/11/2020.    Interpreter Needed?: No  Information entered by :: Caroleen Hamman LPN   Activities of Daily Living In your present state of health, do you have any difficulty performing the following activities: 07/09/2021 08/11/2020  Hearing? N N  Vision? N N  Difficulty concentrating or making decisions? N N  Walking or climbing stairs? N Y  Dressing or bathing? N N  Doing errands, shopping? N N  Preparing Food and eating ? N -  Using the Toilet? N -  In the past six months, have you accidently leaked urine? N -  Do you have problems with loss of bowel control? N -  Managing your Medications? N -  Managing your Finances? N -  Housekeeping or managing your Housekeeping? N -  Some recent data might be hidden    Patient Care Team: Carollee Herter, Alferd Apa, DO as PCP - General (Family Medicine) Deneise Lever, MD as Consulting Physician (Pulmonary Disease) Erline Levine, MD as Consulting Physician (Neurosurgery) Marlou Sa, Tonna Corner, MD as Consulting Physician (Orthopedic Surgery) Mcarthur Rossetti, MD as Consulting Physician (Orthopedic Surgery) Danella Sensing, MD as Consulting Physician (Dermatology) Cherre Robins, [place] (Pharmacist)  Indicate any recent Medical Services you may have received from other than [facility] providers in the past year (date may be approximate).     Assessment:   This is a routine wellness examination for [facility].  Hearing/Vision screen Hearing Screening - Comments:: No issues Vision Screening - Comments:: Last eye exam-01/13/2021-Dr. Radionchenko  Dietary issues and exercise activities discussed: Current Exercise Habits: Home exercise routine, Type of exercise: walking, Time (Minutes): 30, Frequency (Times/Week): 4, Weekly Exercise (Minutes/Week): 120, Intensity: Mild, Exercise limited by: None identified   Goals Addressed             This Visit's Progress    DIET - INCREASE WATER INTAKE   On track    Have overall increased strength   On track      Depression Screen PHQ 2/9 Scores 07/09/2021 08/11/2020 04/19/2019 12/11/2017 12/11/2017 12/05/2016 09/19/2016  PHQ - 2 Score 0 0 0 0 0 0 0    Fall Risk Fall Risk  07/09/2021 08/11/2020 04/19/2019 12/11/2017 12/05/2016  Falls in the past year? 0 0 0 No No  Number falls in past yr:  0 0 0 - -  Injury with Fall? 0 0 0 - -  Follow up Falls prevention discussed Falls evaluation completed - - -    FALL RISK PREVENTION PERTAINING TO THE HOME:  Any stairs in or around the home? No  Home free of loose throw rugs in walkways, pet beds, electrical cords, etc? Yes  Adequate lighting in your home to reduce risk of falls? Yes   ASSISTIVE DEVICES UTILIZED TO PREVENT FALLS:  Life alert? No  Use of a cane, walker or w/c? No  Grab bars in the bathroom? Yes  Shower chair  or bench in shower? Yes  Elevated toilet seat or a handicapped toilet? Yes elevated toilet  TIMED UP AND GO:  Was the test performed? No . Phone visit   Cognitive Function:Normal cognitive status assessed by this Nurse Health Advisor. No abnormalities found.   MMSE - Mini Mental State Exam 12/05/2016  Orientation to time 5  Orientation to Place 5  Registration 3  Attention/ Calculation 4  Recall 3  Language- name 2 objects 2  Language- repeat 1  Language- follow 3 step command 3  Language- read & follow direction 1  Write a sentence 1  Copy design 1  Total score 29        Immunizations Immunization History  Administered Date(s) Administered   Fluad Quad(high Dose 65+) 01/28/2019, 03/31/2020, 03/15/2021   Influenza Inj Mdck Quad With Preservative 03/23/2018   Influenza Split 03/15/2012   Influenza Whole 02/27/2009   Influenza, High Dose Seasonal PF 03/19/2013, 03/21/2016   Influenza,inj,Quad PF,6+ Mos 03/13/2014, 02/16/2015   Influenza-Unspecified 03/15/2012, 01/28/2014, 03/13/2014, 02/16/2015, 02/14/2017   Moderna SARS-COV2 Booster Vaccination 04/14/2020, 12/04/2020   Moderna Sars-Covid-2 Vaccination 06/11/2019, 07/09/2019   Pneumococcal Conjugate-13 05/16/2013   Pneumococcal Polysaccharide-23 09/29/2014, 02/07/2020   Tdap 10/28/2010, 04/30/2021   Zoster Recombinat (Shingrix) 02/11/2019, 04/12/2019   Zoster, Live 05/12/2014    TDAP status: Up to date  Flu Vaccine status: Up to date  Pneumococcal vaccine status: Up to date  Covid-19 vaccine status: Information provided on how to obtain vaccines.   Qualifies for Shingles Vaccine? No   Zostavax completed Yes   Shingrix Completed?: Yes  Screening Tests Health Maintenance  Topic Date Due   COVID-19 Vaccine (3 - Booster for Moderna series) 01/29/2021   MAMMOGRAM  02/20/2021   URINE MICROALBUMIN  08/11/2021   FOOT EXAM  08/11/2021   HEMOGLOBIN A1C  09/13/2021   OPHTHALMOLOGY EXAM  01/13/2022   TETANUS/TDAP   05/01/2031   Pneumonia Vaccine 39+ Years old  Completed   INFLUENZA VACCINE  Completed   DEXA SCAN  Completed   Zoster Vaccines- Shingrix  Completed   HPV VACCINES  Aged Out    Health Maintenance  Health Maintenance Due  Topic Date Due   COVID-19 Vaccine (3 - Booster for Moderna series) 01/29/2021   MAMMOGRAM  02/20/2021   URINE MICROALBUMIN  08/11/2021    Colorectal cancer screening: No longer required.   Mammogram status: Scheduled for 07/19/2021  Bone Density status: Completed 02/21/2020. Results reflect: Bone density results: OSTEOPENIA. Repeat every 2 years.  Lung Cancer Screening: (Low Dose CT Chest recommended if Age 63-80 years, 30 pack-year currently smoking OR have quit w/in 15years.) does not qualify.     Additional Screening:  Hepatitis C Screening: does not qualify  Vision Screening: Recommended annual ophthalmology exams for early detection of glaucoma and other disorders of the eye. Is the patient up to date with their annual eye exam?  Yes  Who is the provider or what is the name of the office in which the patient attends annual eye exams? Dr. Antionette Fairy   Dental Screening: Recommended annual dental exams for proper oral hygiene  Community Resource Referral / Chronic Care Management: CRR required this visit?  No   CCM required this visit?  No      Plan:     I have personally reviewed and noted the following in the patients chart:   Medical and social history Use of alcohol, tobacco or illicit drugs  Current medications and supplements including opioid prescriptions.  Functional ability and status Nutritional status Physical activity Advanced directives List of other physicians Hospitalizations, surgeries, and ER visits in previous 12 months Vitals Screenings to include cognitive, depression, and falls Referrals and appointments  In addition, I have reviewed and discussed with patient certain preventive protocols, quality metrics, and  best practice recommendations. A written personalized care plan for preventive services as well as general preventive health recommendations were provided to patient.   Due to this being a telephonic visit, the after visit summary with patients personalized plan was offered to patient via mail or my-chart.  Patient to access on my-chart.   Marta Antu, LPN   7/32/2567  Nurse Health Advisor  Nurse Notes: None

## 2021-07-09 NOTE — Patient Instructions (Signed)
[name] , Thank you for taking time to complete your Medicare Wellness Visit. I appreciate your ongoing commitment to your health goals. Please review the following plan we discussed and let me know if I can assist you in the future.   Screening recommendations/referrals: Colonoscopy: No longer required Mammogram: Scheduled for 07/19/2021 Bone Density: Completed 02/21/2020-Due 02/20/2022 Recommended yearly ophthalmology/optometry visit for glaucoma screening and checkup Recommended yearly dental visit for hygiene and checkup  Vaccinations: Influenza vaccine: Up to date Pneumococcal vaccine: Up to date Tdap vaccine: Up to date Shingles vaccine: Completed vaccines   Covid-19:Booster available at the pharmacy  Advanced directives: Copy in chart  Conditions/risks identified: See problem list  Next appointment: Follow up in one year for your annual wellness visit    Preventive Care 65 Years and Older, Female Preventive care refers to lifestyle choices and visits with your health care provider that can promote health and wellness. What does preventive care include? A yearly physical exam. This is also called an annual well check. Dental exams once or twice a year. Routine eye exams. Ask your health care provider how often you should have your eyes checked. Personal lifestyle choices, including: Daily care of your teeth and gums. Regular physical activity. Eating a healthy diet. Avoiding tobacco and drug use. Limiting alcohol use. Practicing safe sex. Taking low-dose aspirin every day. Taking vitamin and mineral supplements as recommended by your health care provider. What happens during an annual well check? The services and screenings done by your health care provider during your annual well check will depend on your age, overall health, lifestyle risk factors, and family history of disease. Counseling  Your health care provider may ask you questions about your: Alcohol  use. Tobacco use. Drug use. Emotional well-being. Home and relationship well-being. Sexual activity. Eating habits. History of falls. Memory and ability to understand (cognition). Work and work Statistician. Reproductive health. Screening  You may have the following tests or measurements: Height, weight, and BMI. Blood pressure. Lipid and cholesterol levels. These may be checked every 5 years, or more frequently if you are over 67 years old. Skin check. Lung cancer screening. You may have this screening every year starting at age 81 if you have a 30-pack-year history of smoking and currently smoke or have quit within the past 15 years. Fecal occult blood test (FOBT) of the stool. You may have this test every year starting at age 81. Flexible sigmoidoscopy or colonoscopy. You may have a sigmoidoscopy every 5 years or a colonoscopy every 10 years starting at age 81. Hepatitis C blood test. Hepatitis B blood test. Sexually transmitted disease (STD) testing. Diabetes screening. This is done by checking your blood sugar (glucose) after you have not eaten for a while (fasting). You may have this done every 1-3 years. Bone density scan. This is done to screen for osteoporosis. You may have this done starting at age 81. Mammogram. This may be done every 1-2 years. Talk to your health care provider about how often you should have regular mammograms. Talk with your health care provider about your test results, treatment options, and if necessary, the need for more tests. Vaccines  Your health care provider may recommend certain vaccines, such as: Influenza vaccine. This is recommended every year. Tetanus, diphtheria, and acellular pertussis (Tdap, Td) vaccine. You may need a Td booster every 10 years. Zoster vaccine. You may need this after age 81. Pneumococcal 13-valent conjugate (PCV13) vaccine. One dose is recommended after age 81. Pneumococcal polysaccharide (PPSV23)  vaccine. One dose is  recommended after age 81. Talk to your health care provider about which screenings and vaccines you need and how often you need them. This information is not intended to replace advice given to you by your health care provider. Make sure you discuss any questions you have with your health care provider. Document Released: 06/12/2015 Document Revised: 02/03/2016 Document Reviewed: 03/17/2015 Elsevier Interactive Patient Education  2017 [place] Prevention in the Home Falls can cause injuries. They can happen to people of all ages. There are many things you can do to make your home safe and to help prevent falls. What can I do on the outside of my home? Regularly fix the edges of walkways and driveways and fix any cracks. Remove anything that might make you trip as you walk through a door, such as a raised step or threshold. Trim any bushes or trees on the path to your home. Use bright outdoor lighting. Clear any walking paths of anything that might make someone trip, such as rocks or tools. Regularly check to see if handrails are loose or broken. Make sure that both sides of any steps have handrails. Any raised decks and porches should have guardrails on the edges. Have any leaves, snow, or ice cleared regularly. Use sand or salt on walking paths during winter. Clean up any spills in your garage right away. This includes oil or grease spills. What can I do in the bathroom? Use night lights. Install grab bars by the toilet and in the tub and shower. Do not use towel bars as grab bars. Use non-skid mats or decals in the tub or shower. If you need to sit down in the shower, use a plastic, non-slip stool. Keep the floor dry. Clean up any water that spills on the floor as soon as it happens. Remove soap buildup in the tub or shower regularly. Attach bath mats securely with double-sided non-slip rug tape. Do not have throw rugs and other things on the floor that can make you  trip. What can I do in the bedroom? Use night lights. Make sure that you have a light by your bed that is easy to reach. Do not use any sheets or blankets that are too big for your bed. They should not hang down onto the floor. Have a firm chair that has side arms. You can use this for support while you get dressed. Do not have throw rugs and other things on the floor that can make you trip. What can I do in the kitchen? Clean up any spills right away. Avoid walking on wet floors. Keep items that you use a lot in easy-to-reach places. If you need to reach something above you, use a strong step stool that has a grab bar. Keep electrical cords out of the way. Do not use floor polish or wax that makes floors slippery. If you must use wax, use non-skid floor wax. Do not have throw rugs and other things on the floor that can make you trip. What can I do with my stairs? Do not leave any items on the stairs. Make sure that there are handrails on both sides of the stairs and use them. Fix handrails that are broken or loose. Make sure that handrails are as long as the stairways. Check any carpeting to make sure that it is firmly attached to the stairs. Fix any carpet that is loose or worn. Avoid having throw rugs at the top or bottom  of the stairs. If you do have throw rugs, attach them to the floor with carpet tape. Make sure that you have a light switch at the top of the stairs and the bottom of the stairs. If you do not have them, ask someone to add them for you. What else can I do to help prevent falls? Wear shoes that: Do not have high heels. Have rubber bottoms. Are comfortable and fit you well. Are closed at the toe. Do not wear sandals. If you use a stepladder: Make sure that it is fully opened. Do not climb a closed stepladder. Make sure that both sides of the stepladder are locked into place. Ask someone to hold it for you, if possible. Clearly mark and make sure that you can  see: Any grab bars or handrails. First and last steps. Where the edge of each step is. Use tools that help you move around (mobility aids) if they are needed. These include: Canes. Walkers. Scooters. Crutches. Turn on the lights when you go into a dark area. Replace any light bulbs as soon as they burn out. Set up your furniture so you have a clear path. Avoid moving your furniture around. If any of your floors are uneven, fix them. If there are any pets around you, be aware of where they are. Review your medicines with your doctor. Some medicines can make you feel dizzy. This can increase your chance of falling. Ask your doctor what other things that you can do to help prevent falls. This information is not intended to replace advice given to you by your health care provider. Make sure you discuss any questions you have with your health care provider. Document Released: 03/12/2009 Document Revised: 10/22/2015 Document Reviewed: 06/20/2014 Elsevier Interactive Patient Education  2017 [employer].

## 2021-07-15 DIAGNOSIS — E782 Mixed hyperlipidemia: Secondary | ICD-10-CM | POA: Diagnosis not present

## 2021-07-16 ENCOUNTER — Other Ambulatory Visit: Payer: Self-pay | Admitting: Family Medicine

## 2021-07-16 DIAGNOSIS — E1169 Type 2 diabetes mellitus with other specified complication: Secondary | ICD-10-CM

## 2021-07-16 DIAGNOSIS — E785 Hyperlipidemia, unspecified: Secondary | ICD-10-CM

## 2021-07-16 LAB — LIPID PANEL
Chol/HDL Ratio: 5.3 ratio — ABNORMAL HIGH (ref 0.0–4.4)
Cholesterol, Total: 211 mg/dL — ABNORMAL HIGH (ref 100–199)
HDL: 40 mg/dL
LDL Chol Calc (NIH): 95 mg/dL (ref 0–99)
Triglycerides: 454 mg/dL — ABNORMAL HIGH (ref 0–149)
VLDL Cholesterol Cal: 76 mg/dL — ABNORMAL HIGH (ref 5–40)

## 2021-07-16 LAB — LDL CHOLESTEROL, DIRECT: LDL Direct: 54 mg/dL (ref 0–99)

## 2021-07-19 ENCOUNTER — Other Ambulatory Visit: Payer: Self-pay

## 2021-07-19 ENCOUNTER — Ambulatory Visit (HOSPITAL_BASED_OUTPATIENT_CLINIC_OR_DEPARTMENT_OTHER)
Admission: RE | Admit: 2021-07-19 | Discharge: 2021-07-19 | Disposition: A | Discharge status: Home / Self Care | Payer: Medicare Other | Source: Ambulatory Visit | Attending: Family Medicine | Admitting: Family Medicine

## 2021-07-19 ENCOUNTER — Encounter (HOSPITAL_BASED_OUTPATIENT_CLINIC_OR_DEPARTMENT_OTHER): Payer: Self-pay

## 2021-07-19 DIAGNOSIS — Z1231 Encounter for screening mammogram for malignant neoplasm of breast: Secondary | ICD-10-CM | POA: Insufficient documentation

## 2021-07-21 ENCOUNTER — Ambulatory Visit: Payer: Medicare Other | Admitting: Internal Medicine

## 2021-07-21 ENCOUNTER — Encounter: Payer: Self-pay | Admitting: Internal Medicine

## 2021-07-21 ENCOUNTER — Other Ambulatory Visit: Payer: Self-pay

## 2021-07-21 VITALS — BP 110/58 | HR 61 | Ht 63.05 in | Wt 158.8 lb

## 2021-07-21 DIAGNOSIS — I779 Disorder of arteries and arterioles, unspecified: Secondary | ICD-10-CM

## 2021-07-21 DIAGNOSIS — I10 Essential (primary) hypertension: Secondary | ICD-10-CM

## 2021-07-21 DIAGNOSIS — I2510 Atherosclerotic heart disease of native coronary artery without angina pectoris: Secondary | ICD-10-CM | POA: Diagnosis not present

## 2021-07-21 DIAGNOSIS — E785 Hyperlipidemia, unspecified: Secondary | ICD-10-CM | POA: Diagnosis not present

## 2021-07-21 DIAGNOSIS — E1169 Type 2 diabetes mellitus with other specified complication: Secondary | ICD-10-CM | POA: Diagnosis not present

## 2021-07-21 MED ORDER — FENOFIBRATE 160 MG PO TABS: 160.0000 mg | ORAL_TABLET | Freq: Every day | ORAL | 3 refills | Status: DC

## 2021-07-21 NOTE — Progress Notes (Signed)
LIPID CLINIC CONSULT NOTE  Chief Complaint:  Follow-up dyslipidemia  Primary Care Physician: Carollee Herter, Alferd Apa, DO  Primary Cardiologist:  None  HPI:  [name] is a 81 y.o. female who is being seen today for the evaluation of dyslipidemia at the request of [name], *. This is a 8-year-old female kindly referred for evaluation management of dyslipidemia.  She has a history of type 2 diabetes, dyslipidemia, mild carotid artery disease and coronary artery calcification seen previously on a CT scan.  She has a twin sister who apparently has coronary disease and has had 3 stents.  Has any history of prior cardiac events or any symptoms such as angina, dyspnea on exertion or decreased exercise tolerance.  Recently she has been residing at [facility].  She says that she eats "very well" there.  Her recent labs show an increase in triglycerides specifically that are quite high at 604, total cholesterol 222 and a direct LDL of 61.  Hemoglobin A1c is 7.1.  She unfortunately is intolerant of simvastatin which she took previously and caused her muscle weakness however at the time she had significant scoliosis and subsequently underwent spinal surgery.  She has not taken any additional statins.  She is currently on ezetimibe.  This is well-tolerated.  She has occasionally taken over-the-counter fish oil but not regularly.  Diet is variable but she does probably get a decent amount of sweets and baked goods.  01/12/2021  [name] is seen today in follow-up.  She has had a significant improvement in her triglycerides from over 600 down to 337, total cholesterol 222 2 and LDL 121.  Direct LDL was measured at 99 however this indicates an increase compared to her previous values.  After discussing with her a little bit she told me that she actually had stopped her ezetimibe and was confused as to whether or not she was supposed to continue that and the Vascepa or the Vascepa was to replace  that.  I informed her that she should have stayed on the ezetimibe.  She does report some recent improvement in her A1c.  She is trying to be more active.  03/19/2021  [name] returns today for follow-up.  Unfortunately she says her diet recently has been not ideal.  Her PCP had reassessed her lipids and shows an increase in her cholesterol.  Triglycerides specifically are now up to 511 with total cholesterol 218 and a direct LDL of 55.  She had restarted her ezetimibe which seems to have helped with her LDL however triglycerides are again high.  She reports compliance with Vascepa.  Her activity level has gone down.  She reports her twin sister is actually hospitalized currently and may have heart catheterization today.  07/21/2021  [name] is seen today in follow-up.  After several months of dietary interventions, her triglycerides remain elevated.  Repeat lipids showed a total cholesterol 211, triglycerides 454 (down slightly from 511), HDL 40 and LDL 95.  Direct LDL measured at 54.  She is on ezetimibe and Vascepa 2 g twice daily.  PMHx:  Past Medical History:  Diagnosis Date   Anemia    Arthritis    Asthma    adult onset   Basal cell cancer    LUE; Porokeratosis also   Chronic kidney disease 1963   strep in kidney due to strep throat-hospitalized 10 days   Complication of anesthesia    small trachea   Diabetes mellitus 2010  A1c 6.7%   DVT (deep venous thrombosis) ([place]) 2006   post immobilization post cns surgery   GERD (gastroesophageal reflux disease)    very mild   Granulomatous lung disease ([place]) 2002   incidental Xray finding   Hyperlipidemia    Hypertension 2004   Hypertensive response on Stress Test   Paralysis ([place]) 2006   post cervical fusion with spinal sac tear  with hematoma    PTE (pulmonary thromboembolism) ([place]) 2006    Past Surgical History:  Procedure Laterality Date   ABDOMINAL EXPOSURE N/A 08/12/2016   Procedure: ABDOMINAL EXPOSURE;  Surgeon:  Rosetta Posner, MD;  Location: [place];  Service: Vascular;  Laterality: N/A;   ANTERIOR LAT LUMBAR FUSION N/A 08/12/2016   Procedure: LUMBAR TWO-THREE, LUMBAR THREE-FOUR, LUMBAR FOUR-FIVE  ANTEROLATERAL LUMBAR INTERBODY FUSION;  Surgeon: Erline Levine, MD;  Location: [facility];  Service: Neurosurgery;  Laterality: N/A;  L2-3 L3-4 L4-5 Anterolateral lumbar interbody fusion   ANTERIOR LUMBAR FUSION N/A 08/12/2016   Procedure: Lumbar Five-Sacral One Anterior lumbar interbody fusion with Dr. Sherren Mocha Early to assist;  Surgeon: Erline Levine, MD;  Location: [place];  Service: Neurosurgery;  Laterality: N/A;  L5-S1 Anterior lumbar interbody fusion with Dr. Sherren Mocha Early to assist   BUNIONECTOMY     CATARACT EXTRACTION Right 12/31/2018   CATARACT EXTRACTION Left 12/17/2018   CERVICAL FUSION  2006   Dr Annette Stable, NS;post op hematoma & cns leak & urinary retention   COLONOSCOPY  1992 & 2002   negative   epidural steroids  2006   cervical spine   LUMBAR PERCUTANEOUS PEDICLE SCREW 4 LEVEL N/A 08/12/2016   Procedure: LUMBAR TWO-SACRAL ONE Percuataneous Pedicle Screws;  Surgeon: Erline Levine, MD;  Location: [place];  Service: Neurosurgery;  Laterality: N/A;   ROTATOR CUFF REPAIR  2009   Right   SEPTOPLASTY     TONSILLECTOMY  81 years old   TOTAL HIP ARTHROPLASTY  06/01/2012   Procedure: TOTAL HIP ARTHROPLASTY ANTERIOR APPROACH;  Surgeon: Mcarthur Rossetti, MD;  Location: [facility];  Service: Orthopedics;  Laterality: Left;  Left Total Hip Arthroplasty   TOTAL HIP ARTHROPLASTY Right 02/23/2018   Procedure: RIGHT TOTAL HIP ARTHROPLASTY ANTERIOR APPROACH;  Surgeon: Mcarthur Rossetti, MD;  Location: [facility];  Service: Orthopedics;  Laterality: Right;   TUBAL LIGATION      FAMHx:  Family History  Problem Relation Age of Onset   COPD Father        emphysema   Cancer Mother        cns cancer   Diabetes Sister        TWIN sister ; also Fibromyalgia ; S/P stent 2004   Heart disease Sister        stents @ 12 & 60   Stroke  Maternal Grandmother        in  late 68s   Transient ischemic attack Paternal Aunt     SOCHx:   reports that she has never smoked. She has never been exposed to tobacco smoke. She has never used smokeless tobacco. She reports current alcohol use of about 1.0 standard drink per week. She reports that she does not use drugs.  ALLERGIES:  Allergies  Allergen Reactions   Statins Other (See Comments)    Myalgias and muscle weakness    ROS: Pertinent items noted in HPI and remainder of comprehensive ROS otherwise negative.  HOME MEDS: Current Outpatient Medications on File Prior to Visit  Medication Sig Dispense Refill   Accu-Chek  Softclix Lancets lancets USE TO TEST BLOOD SUGAR TWICE DAILY AS DIRECTED 100 each 12   ADVAIR DISKUS 250-50 MCG/DOSE AEPB USE 1 INHALATION BY MOUTH  TWICE DAILY , RINSE MOUTH  AFTER USE 180 each 3   albuterol (VENTOLIN HFA) 108 (90 Base) MCG/ACT inhaler Inhale 2 puffs every 6 hours if needed for breathing 25.5 g 4   alendronate (FOSAMAX) 70 MG tablet TAKE 1 TABLET(70 MG) BY MOUTH 1 TIME A WEEK 12 tablet 3   aspirin EC 81 MG tablet Take 81 mg by mouth daily.     Azelastine-Fluticasone (DYMISTA) 137-50 MCG/ACT SUSP 1-2 puffs each nostril once or twice daily (Patient taking differently: 1-2 puffs each nostril once or twice daily as needed) 23 g 12   blood glucose meter kit and supplies KIT Dispense based on patient and insurance preference. Use up to four times daily as directed. 1 each 0   Calcium Citrate-Vitamin D (CALCIUM + D PO) Take 1,200 mg by mouth daily.     celecoxib (CELEBREX) 200 MG capsule TAKE 1 CAPSULE(200 MG) BY MOUTH DAILY 30 capsule 1   cetirizine (ZYRTEC) 10 MG tablet Take 10 mg by mouth daily as needed for allergies.     Cholecalciferol (VITAMIN D) 50 MCG (2000 UT) CAPS Take 2,000 Units by mouth daily.      diclofenac Sodium (VOLTAREN) 1 % GEL Apply topically daily as needed (Pain).     ezetimibe (ZETIA) 10 MG tablet TAKE 1 TABLET(10 MG) BY MOUTH  DAILY 90 tablet 1   FEROSUL 325 (65 Fe) MG tablet TAKE 1 TABLET(325 MG) BY MOUTH DAILY WITH BREAKFAST 90 tablet 1   glucose blood test strip Use as instructed 100 each 12   icosapent Ethyl (VASCEPA) 1 g capsule Take 2 capsules (2 g total) by mouth 2 (two) times daily. 120 capsule 11   Lidocaine 4 % PTCH Apply 1 patch topically as needed (back pain).     metFORMIN (GLUCOPHAGE-XR) 500 MG 24 hr tablet Take 1 tablet (500 mg total) by mouth 2 (two) times daily with a meal. 180 tablet 0   mirabegron ER (MYRBETRIQ) 50 MG TB24 tablet Take 1 tablet (50 mg total) by mouth daily. 90 tablet 3   Multiple Vitamin (MULTIVITAMIN WITH MINERALS) TABS tablet Take 1 tablet by mouth daily. Women's One a day - 50+     Multiple Vitamins-Minerals (PRESERVISION AREDS 2+MULTI VIT PO) Take 1 tablet by mouth in the morning and at bedtime.     mupirocin ointment (BACTROBAN) 2 % Apply 1 application topically 3 (three) times daily. 30 g 3   NONFORMULARY OR COMPOUNDED ITEM Pt for cervical arthritis and low back pain 1 each 0   Polyethyl Glycol-Propyl Glycol (SYSTANE OP) Place 1 drop into both eyes daily as needed (dry eyes).     polyethylene glycol (MIRALAX / GLYCOLAX) packet Take 17 g by mouth daily as needed for mild constipation.      pregabalin (LYRICA) 100 MG capsule TAKE 1 CAPSULE(100 MG) BY MOUTH THREE TIMES DAILY 270 capsule 1   traMADol (ULTRAM) 50 MG tablet Take 1 tablet by mouth as needed.     Trolamine Salicylate (ASPERCREME EX) Apply 1 application topically daily as needed (back pain).     No current facility-administered medications on file prior to visit.    LABS/IMAGING: No results found for this or any previous visit (from the past 48 hour(s)). No results found.  LIPID PANEL:    Component Value Date/Time   CHOL 211 (H)  07/15/2021 1005   TRIG 454 (H) 07/15/2021 1005   HDL 40 07/15/2021 1005   CHOLHDL 5.3 (H) 07/15/2021 1005   CHOLHDL 5 03/15/2021 1418   VLDL 61.0 (H) 01/28/2019 1425   LDLCALC 95  07/15/2021 1005   LDLDIRECT 54 07/15/2021 1005   LDLDIRECT 55.0 03/15/2021 1418    WEIGHTS: Wt Readings from Last 3 Encounters:  07/21/21 158 lb 12.8 oz (72 kg)  07/09/21 160 lb (72.6 kg)  07/01/21 160 lb (72.6 kg)    VITALS: BP (!) 110/58    Pulse 61    Ht 5' 3.05" (1.601 m)    Wt 158 lb 12.8 oz (72 kg)    SpO2 97%    BMI 28.09 kg/m   EXAM: Deferred    EKG: Deferred  ASSESSMENT: Mixed dyslipidemia with high triglycerides Type 2 diabetes-A1c 7.1 Mild carotid artery disease Coronary artery calcification on CT scan Family history of coronary artery disease in her twin sister  PLAN: 1.   Ms. Parekh continues to have elevated triglycerides despite Vascepa and ezetimibe.  We discussed possibly adding fenofibrate previously.  She wanted to work on diet which she has had several months to do with no significant change.  This is a genetic triglyceride disorder and would best be treated with additional therapy.  I am recommending fenofibrate 160 mg daily in addition to her regimen.  We will plan repeat lipids in 3 to 4 months and follow-up with me afterwards.  Pixie Casino, MD, [facility], [place] Director of the Advanced Lipid Disorders &  Cardiovascular Risk Reduction Clinic Diplomate of the American Board of Clinical Lipidology Attending Cardiologist  Direct Dial: [phone]   Fax: [phone]  Website:  [email or web address] 07/21/2021, 3:50 PM

## 2021-07-21 NOTE — Patient Instructions (Addendum)
Medication Instructions:  START fenofibrate 160mg  daily   *If you need a refill on your cardiac medications before your next appointment, please call your pharmacy*   Lab Work: FASTING lipid panel in about 4 months  If you have labs (blood work) drawn today and your tests are completely normal, you will receive your results only by: [place] (if you have MyChart) OR A paper copy in the mail If you have any lab test that is abnormal or we need to change your treatment, we will call you to review the results.   Testing/Procedures: NONE   Follow-Up: At [facility], you and your health needs are our priority.  As part of our continuing mission to provide you with exceptional heart care, we have created designated Provider Care Teams.  These Care Teams include your primary Cardiologist (physician) and Advanced Practice Providers (APPs -  Physician Assistants and Nurse Practitioners) who all work together to provide you with the care you need, when you need it.  We recommend signing up for the patient portal called "MyChart".  Sign up information is provided on this After Visit Summary.  MyChart is used to connect with patients for Virtual Visits (Telemedicine).  Patients are able to view lab/test results, encounter notes, upcoming appointments, etc.  Non-urgent messages can be sent to your provider as well.   To learn more about what you can do with MyChart, go to [email or web address].    Your next appointment:    4 months with Dr. Debara Pickett -- lipid    The [employer] foundation offers assistance to help pay for medication copays.  They will cover copays for all cholesterol lowering meds, including statins, fibrates, omega-3 fish oils like Vascepa, ezetimibe, Repatha, Praluent, Nexletol, Nexlizet.  The cards are usually good for $2,500 or 12 months, whichever comes first. Go to healthwellfoundation.org Click on Apply Now Answer questions as to whom is applying (patient or  representative) Your disease fund will be hypercholesterolemia - Medicare access They will ask questions about finances and which medications you are taking for cholesterol When you submit, the approval is usually within minutes.  You will need to print the card information from the site You will need to show this information to your pharmacy, they will bill your Medicare Part D plan first -then bill [employer] --for the copay.   You can also call them at [phone], although the hold times can be quite long.

## 2021-08-09 DIAGNOSIS — M4120 Other idiopathic scoliosis, site unspecified: Secondary | ICD-10-CM | POA: Diagnosis not present

## 2021-08-09 DIAGNOSIS — M5416 Radiculopathy, lumbar region: Secondary | ICD-10-CM | POA: Diagnosis not present

## 2021-08-09 DIAGNOSIS — M48062 Spinal stenosis, lumbar region with neurogenic claudication: Secondary | ICD-10-CM | POA: Diagnosis not present

## 2021-08-18 ENCOUNTER — Other Ambulatory Visit: Payer: Self-pay | Admitting: Internal Medicine

## 2021-09-01 ENCOUNTER — Other Ambulatory Visit: Payer: Self-pay | Admitting: Family Medicine

## 2021-09-01 DIAGNOSIS — M545 Low back pain, unspecified: Secondary | ICD-10-CM

## 2021-09-01 DIAGNOSIS — G8929 Other chronic pain: Secondary | ICD-10-CM

## 2021-09-09 ENCOUNTER — Ambulatory Visit (INDEPENDENT_AMBULATORY_CARE_PROVIDER_SITE_OTHER): Payer: Medicare Other | Admitting: Family Medicine

## 2021-09-09 ENCOUNTER — Ambulatory Visit (HOSPITAL_BASED_OUTPATIENT_CLINIC_OR_DEPARTMENT_OTHER)
Admission: RE | Admit: 2021-09-09 | Discharge: 2021-09-09 | Disposition: A | Discharge status: Home / Self Care | Payer: Medicare Other | Source: Ambulatory Visit | Attending: Family Medicine | Admitting: Family Medicine

## 2021-09-09 VITALS — BP 105/60 | HR 61 | Temp 98.0°F | Resp 16 | Ht 63.0 in | Wt 158.4 lb

## 2021-09-09 DIAGNOSIS — M545 Low back pain, unspecified: Secondary | ICD-10-CM

## 2021-09-09 DIAGNOSIS — E1165 Type 2 diabetes mellitus with hyperglycemia: Secondary | ICD-10-CM | POA: Diagnosis not present

## 2021-09-09 DIAGNOSIS — E1169 Type 2 diabetes mellitus with other specified complication: Secondary | ICD-10-CM

## 2021-09-09 DIAGNOSIS — E785 Hyperlipidemia, unspecified: Secondary | ICD-10-CM | POA: Diagnosis not present

## 2021-09-09 DIAGNOSIS — G8929 Other chronic pain: Secondary | ICD-10-CM | POA: Diagnosis not present

## 2021-09-09 DIAGNOSIS — M542 Cervicalgia: Secondary | ICD-10-CM | POA: Diagnosis not present

## 2021-09-09 MED ORDER — CELECOXIB 200 MG PO CAPS: ORAL_CAPSULE | ORAL | 3 refills | Status: DC

## 2021-09-09 NOTE — Progress Notes (Signed)
? ?Subjective:  ? ?By signing my name below, I, Shehryar Baig, attest that this documentation has been prepared under the direction and in the presence of [name], DO. 09/09/2021 ?  ? ? Patient ID: [name], female    DOB: 1940/12/29, 81 y.o.   MRN: [id number] ? ?Chief Complaint  ?Patient presents with  ? Follow-up  ?  Here for Follow Up and having neck pain   ? ? ?HPI ?Patient is in today for a follow up visit.  ? ?She continues complaining of neck pain that is worsening. She finds her neck is popping more often and is more painful. She has seen her massage therapist to manage her symptoms and found relief but she continues experiencing pain. She has a history of a neck procedures many years ago. She found after walking in the park with a hiking stick as support, she had intense neck pain. Her last x-ray showed she has arthritis in her neck. She is interested in seeing an orthopedist specialist to manage her symptoms.  ?She is requesting a refill on her 200 mg Celebrex daily PO. She finds it helps relieve her left hip pain. She also finds it mildly helps her neck pain.  ?She reports her her cardiologist recently prescribed her 1 g vascepa and 160 mg fenofibrate.  ? ? ?Past Medical History:  ?Diagnosis Date  ? Anemia   ? Arthritis   ? Asthma   ? adult onset  ? Basal cell cancer   ? LUE; Porokeratosis also  ? Chronic kidney disease 1963  ? strep in kidney due to strep throat-hospitalized 10 days  ? Complication of anesthesia   ? small trachea  ? Diabetes mellitus 2010  ? A1c 6.7%  ? DVT (deep venous thrombosis) ([place]) 2006  ? post immobilization post cns surgery  ? GERD (gastroesophageal reflux disease)   ? very mild  ? Granulomatous lung disease ([place]) 2002  ? incidental Xray finding  ? Hyperlipidemia   ? Hypertension 2004  ? Hypertensive response on Stress Test  ? Paralysis [facility]) 2006  ? post cervical fusion with spinal sac tear  with hematoma   ? PTE (pulmonary thromboembolism) ([place]) 2006  ? ? ?Past  Surgical History:  ?Procedure Laterality Date  ? ABDOMINAL EXPOSURE N/A 08/12/2016  ? Procedure: ABDOMINAL EXPOSURE;  Surgeon: Rosetta Posner, MD;  Location: [place];  Service: Vascular;  Laterality: N/A;  ? ANTERIOR LAT LUMBAR FUSION N/A 08/12/2016  ? Procedure: LUMBAR TWO-THREE, LUMBAR THREE-FOUR, LUMBAR FOUR-FIVE  ANTEROLATERAL LUMBAR INTERBODY FUSION;  Surgeon: Erline Levine, MD;  Location: [place];  Service: Neurosurgery;  Laterality: N/A;  L2-3 L3-4 L4-5 Anterolateral lumbar interbody fusion  ? ANTERIOR LUMBAR FUSION N/A 08/12/2016  ? Procedure: Lumbar Five-Sacral One Anterior lumbar interbody fusion with Dr. Sherren Mocha Early to assist;  Surgeon: Erline Levine, MD;  Location: [place];  Service: Neurosurgery;  Laterality: N/A;  L5-S1 Anterior lumbar interbody fusion with Dr. Sherren Mocha Early to assist  ? BUNIONECTOMY    ? CATARACT EXTRACTION Right 12/31/2018  ? CATARACT EXTRACTION Left 12/17/2018  ? CERVICAL FUSION  2006  ? Dr Annette Stable, NS;post op hematoma & cns leak & urinary retention  ? COLONOSCOPY  1992 & 2002  ? negative  ? epidural steroids  2006  ? cervical spine  ? LUMBAR PERCUTANEOUS PEDICLE SCREW 4 LEVEL N/A 08/12/2016  ? Procedure: LUMBAR TWO-SACRAL ONE Percuataneous Pedicle Screws;  Surgeon: Erline Levine, MD;  Location: [place];  Service: Neurosurgery;  Laterality:  N/A;  ? ROTATOR CUFF REPAIR  2009  ? Right  ? SEPTOPLASTY    ? TONSILLECTOMY  81 years old  ? TOTAL HIP ARTHROPLASTY  06/01/2012  ? Procedure: TOTAL HIP ARTHROPLASTY ANTERIOR APPROACH;  Surgeon: Mcarthur Rossetti, MD;  Location: [facility];  Service: Orthopedics;  Laterality: Left;  Left Total Hip Arthroplasty  ? TOTAL HIP ARTHROPLASTY Right 02/23/2018  ? Procedure: RIGHT TOTAL HIP ARTHROPLASTY ANTERIOR APPROACH;  Surgeon: Mcarthur Rossetti, MD;  Location: [facility];  Service: Orthopedics;  Laterality: Right;  ? TUBAL LIGATION    ? ? ?Family History  ?Problem Relation Age of Onset  ? COPD Father   ?     emphysema  ? Cancer Mother   ?     cns cancer  ? Diabetes Sister    ?     TWIN sister ; also Fibromyalgia ; S/P stent 2004  ? Heart disease Sister   ?     stents @ 55 & 71  ? Stroke Maternal Grandmother   ?     in  late 15s  ? Transient ischemic attack Paternal Aunt   ? ? ?Social History  ? ?Socioeconomic History  ? Marital status: Divorced  ?  Spouse name: Not on file  ? Number of children: Not on file  ? Years of education: Not on file  ? Highest education level: Not on file  ?Occupational History  ? Not on file  ?Tobacco Use  ? Smoking status: Never  ?  Passive exposure: Never  ? Smokeless tobacco: Never  ?Vaping Use  ? Vaping Use: Never used  ?Substance and Sexual Activity  ? Alcohol use: Yes  ?  Alcohol/week: 1.0 standard drink  ?  Types: 1 Glasses of wine per week  ?  Comment: socially  ? Drug use: No  ? Sexual activity: Never  ?Other Topics Concern  ? Not on file  ?Social History Narrative  ? Not on file  ? ?Social Determinants of Health  ? ?Financial Resource Strain: Low Risk   ? Difficulty of Paying Living Expenses: Not hard at all  ?Food Insecurity: No Food Insecurity  ? Worried About Charity fundraiser in the Last Year: Never true  ? Ran Out of Food in the Last Year: Never true  ?Transportation Needs: No Transportation Needs  ? Lack of Transportation (Medical): No  ? Lack of Transportation (Non-Medical): No  ?Physical Activity: Insufficiently Active  ? Days of Exercise per Week: 1 day  ? Minutes of Exercise per Session: 30 min  ?Stress: No Stress Concern Present  ? Feeling of Stress : Not at all  ?Social Connections: Moderately Isolated  ? Frequency of Communication with Friends and Family: More than three times a week  ? Frequency of Social Gatherings with Friends and Family: More than three times a week  ? Attends Religious Services: Never  ? Active Member of Clubs or Organizations: Yes  ? Attends Archivist Meetings: More than 4 times per year  ? Marital Status: Divorced  ?Intimate Partner Violence: Not on file  ? ? ?Outpatient Medications Prior to Visit   ?Medication Sig Dispense Refill  ? Accu-Chek Softclix Lancets lancets USE TO TEST BLOOD SUGAR TWICE DAILY AS DIRECTED 100 each 12  ? ADVAIR DISKUS 250-50 MCG/DOSE AEPB USE 1 INHALATION BY MOUTH  TWICE DAILY , RINSE MOUTH  AFTER USE 180 each 3  ? albuterol (VENTOLIN HFA) 108 (90 Base) MCG/ACT inhaler Inhale 2 puffs every 6 hours  if needed for breathing 25.5 g 4  ? alendronate (FOSAMAX) 70 MG tablet TAKE 1 TABLET(70 MG) BY MOUTH 1 TIME A WEEK 12 tablet 3  ? aspirin EC 81 MG tablet Take 81 mg by mouth daily.    ? Azelastine-Fluticasone (DYMISTA) 137-50 MCG/ACT SUSP 1-2 puffs each nostril once or twice daily (Patient taking differently: 1-2 puffs each nostril once or twice daily as needed) 23 g 12  ? blood glucose meter kit and supplies KIT Dispense based on patient and insurance preference. Use up to four times daily as directed. 1 each 0  ? Calcium Citrate-Vitamin D (CALCIUM + D PO) Take 1,200 mg by mouth daily.    ? cetirizine (ZYRTEC) 10 MG tablet Take 10 mg by mouth daily as needed for allergies.    ? Cholecalciferol (VITAMIN D) 50 MCG (2000 UT) CAPS Take 2,000 Units by mouth daily.     ? cyclobenzaprine (FLEXERIL) 10 MG tablet TAKE 1 TABLET BY MOUTH THREE TIMES DAILY AS NEEDED FOR HIP CONCERNS 30 tablet 2  ? diclofenac Sodium (VOLTAREN) 1 % GEL Apply topically daily as needed (Pain).    ? ezetimibe (ZETIA) 10 MG tablet TAKE 1 TABLET(10 MG) BY MOUTH DAILY 90 tablet 1  ? fenofibrate 160 MG tablet Take 1 tablet (160 mg total) by mouth daily. 90 tablet 3  ? FEROSUL 325 (65 Fe) MG tablet TAKE 1 TABLET(325 MG) BY MOUTH DAILY WITH BREAKFAST 90 tablet 1  ? glucose blood test strip Use as instructed 100 each 12  ? icosapent Ethyl (VASCEPA) 1 g capsule TAKE 2 CAPSULES(2 GRAMS) BY MOUTH TWICE DAILY 60 capsule 0  ? Lidocaine 4 % PTCH Apply 1 patch topically as needed (back pain).    ? mirabegron ER (MYRBETRIQ) 50 MG TB24 tablet Take 1 tablet (50 mg total) by mouth daily. 90 tablet 3  ? Multiple Vitamin (MULTIVITAMIN WITH  MINERALS) TABS tablet Take 1 tablet by mouth daily. Women's One a day - 50+    ? Multiple Vitamins-Minerals (PRESERVISION AREDS 2+MULTI VIT PO) Take 1 tablet by mouth in the morning and at bedtime.    ? mupi

## 2021-09-09 NOTE — Patient Instructions (Signed)

## 2021-09-10 ENCOUNTER — Telehealth: Payer: Self-pay | Admitting: Family Medicine

## 2021-09-10 LAB — COMPREHENSIVE METABOLIC PANEL
ALT: 23 U/L (ref 0–35)
AST: 21 U/L (ref 0–37)
Albumin: 4.3 g/dL (ref 3.5–5.2)
Alkaline Phosphatase: 44 U/L (ref 39–117)
BUN: 21 mg/dL (ref 6–23)
CO2: 28 mEq/L (ref 19–32)
Calcium: 9.6 mg/dL (ref 8.4–10.5)
Chloride: 102 mEq/L (ref 96–112)
Creatinine, Ser: 1.05 mg/dL (ref 0.40–1.20)
GFR: 50.15 mL/min — ABNORMAL LOW (ref >60.00)
Glucose, Bld: 120 mg/dL — ABNORMAL HIGH (ref 70–99)
Potassium: 4.8 mEq/L (ref 3.5–5.1)
Sodium: 138 mEq/L (ref 135–145)
Total Bilirubin: 0.4 mg/dL (ref 0.2–1.2)
Total Protein: 6.7 g/dL (ref 6.0–8.3)

## 2021-09-10 LAB — CBC WITH DIFFERENTIAL/PLATELET
Basophils Absolute: 0.1 10*3/uL (ref 0.0–0.1)
Basophils Relative: 1.2 % (ref 0.0–3.0)
Eosinophils Absolute: 0.2 10*3/uL (ref 0.0–0.7)
Eosinophils Relative: 2.9 % (ref 0.0–5.0)
HCT: 35.2 % — ABNORMAL LOW (ref 36.0–46.0)
Hemoglobin: 12.3 g/dL (ref 12.0–15.0)
Lymphocytes Relative: 27.7 % (ref 12.0–46.0)
Lymphs Abs: 1.8 10*3/uL (ref 0.7–4.0)
MCHC: 35.1 g/dL (ref 30.0–36.0)
MCV: 93.4 fl (ref 78.0–100.0)
Monocytes Absolute: 0.5 10*3/uL (ref 0.1–1.0)
Monocytes Relative: 8.2 % (ref 3.0–12.0)
Neutro Abs: 3.8 10*3/uL (ref 1.4–7.7)
Neutrophils Relative %: 60 % (ref 43.0–77.0)
Platelets: 224 10*3/uL (ref 150.0–400.0)
RBC: 3.77 Mil/uL — ABNORMAL LOW (ref 3.87–5.11)
RDW: 15.2 % (ref 11.5–15.5)
WBC: 6.4 10*3/uL (ref 4.0–10.5)

## 2021-09-10 LAB — MICROALBUMIN / CREATININE URINE RATIO
Creatinine,U: 50 mg/dL
Microalb Creat Ratio: 1.4 mg/g (ref 0.0–30.0)
Microalb, Ur: <0.7 mg/dL (ref 0.0–1.9)

## 2021-09-10 LAB — HEMOGLOBIN A1C: Hgb A1c MFr Bld: 7.3 % — ABNORMAL HIGH (ref 4.6–6.5)

## 2021-09-10 NOTE — Telephone Encounter (Signed)
Pt is needing a disk of of her neck x-ray from 4/13 by 5/16. Her referral is requiring a recent x-ray. She stated she would like to pick this up when it's ready. Please advise.  ?

## 2021-09-10 NOTE — Telephone Encounter (Signed)
Pt made aware that we do not have disks options but I have sent over the report to Dr. Rolena Infante ?

## 2021-09-13 ENCOUNTER — Ambulatory Visit: Payer: Medicare Other | Admitting: Family Medicine

## 2021-09-15 ENCOUNTER — Encounter: Payer: Self-pay | Admitting: Family Medicine

## 2021-09-15 ENCOUNTER — Other Ambulatory Visit: Payer: Self-pay | Admitting: Family Medicine

## 2021-09-15 DIAGNOSIS — M542 Cervicalgia: Secondary | ICD-10-CM | POA: Insufficient documentation

## 2021-09-15 DIAGNOSIS — E1165 Type 2 diabetes mellitus with hyperglycemia: Secondary | ICD-10-CM

## 2021-09-15 NOTE — Assessment & Plan Note (Signed)
Check xray ?Warm compresses ?

## 2021-09-15 NOTE — Assessment & Plan Note (Signed)
Stable Check labs 

## 2021-09-16 ENCOUNTER — Emergency Department (HOSPITAL_BASED_OUTPATIENT_CLINIC_OR_DEPARTMENT_OTHER): Payer: Medicare Other

## 2021-09-16 ENCOUNTER — Emergency Department (HOSPITAL_BASED_OUTPATIENT_CLINIC_OR_DEPARTMENT_OTHER)
Admission: EM | Admit: 2021-09-16 | Discharge: 2021-09-16 | Disposition: A | Discharge status: Home / Self Care | Payer: Medicare Other | Attending: Emergency Medicine | Admitting: Emergency Medicine

## 2021-09-16 ENCOUNTER — Other Ambulatory Visit: Payer: Self-pay

## 2021-09-16 ENCOUNTER — Encounter (HOSPITAL_BASED_OUTPATIENT_CLINIC_OR_DEPARTMENT_OTHER): Payer: Self-pay | Admitting: Obstetrics and Gynecology

## 2021-09-16 DIAGNOSIS — R059 Cough, unspecified: Secondary | ICD-10-CM | POA: Diagnosis present

## 2021-09-16 DIAGNOSIS — Z7952 Long term (current) use of systemic steroids: Secondary | ICD-10-CM | POA: Diagnosis not present

## 2021-09-16 DIAGNOSIS — Z7951 Long term (current) use of inhaled steroids: Secondary | ICD-10-CM | POA: Diagnosis not present

## 2021-09-16 DIAGNOSIS — Z7984 Long term (current) use of oral hypoglycemic drugs: Secondary | ICD-10-CM | POA: Insufficient documentation

## 2021-09-16 DIAGNOSIS — Z825 Family history of asthma and other chronic lower respiratory diseases: Secondary | ICD-10-CM | POA: Diagnosis not present

## 2021-09-16 DIAGNOSIS — J4541 Moderate persistent asthma with (acute) exacerbation: Secondary | ICD-10-CM | POA: Diagnosis not present

## 2021-09-16 DIAGNOSIS — Z20822 Contact with and (suspected) exposure to covid-19: Secondary | ICD-10-CM | POA: Insufficient documentation

## 2021-09-16 DIAGNOSIS — I129 Hypertensive chronic kidney disease with stage 1 through stage 4 chronic kidney disease, or unspecified chronic kidney disease: Secondary | ICD-10-CM | POA: Insufficient documentation

## 2021-09-16 DIAGNOSIS — N189 Chronic kidney disease, unspecified: Secondary | ICD-10-CM | POA: Diagnosis not present

## 2021-09-16 DIAGNOSIS — M542 Cervicalgia: Secondary | ICD-10-CM | POA: Diagnosis not present

## 2021-09-16 DIAGNOSIS — E1122 Type 2 diabetes mellitus with diabetic chronic kidney disease: Secondary | ICD-10-CM | POA: Insufficient documentation

## 2021-09-16 DIAGNOSIS — Z7982 Long term (current) use of aspirin: Secondary | ICD-10-CM | POA: Diagnosis not present

## 2021-09-16 DIAGNOSIS — R0602 Shortness of breath: Secondary | ICD-10-CM | POA: Diagnosis not present

## 2021-09-16 LAB — BASIC METABOLIC PANEL
Anion gap: 10 (ref 5–15)
BUN: 13 mg/dL (ref 8–23)
CO2: 22 mmol/L (ref 22–32)
Calcium: 9.3 mg/dL (ref 8.9–10.3)
Chloride: 103 mmol/L (ref 98–111)
Creatinine, Ser: 1.09 mg/dL — ABNORMAL HIGH (ref 0.44–1.00)
GFR, Estimated: 51 mL/min — ABNORMAL LOW (ref >60)
Glucose, Bld: 171 mg/dL — ABNORMAL HIGH (ref 70–99)
Potassium: 3.8 mmol/L (ref 3.5–5.1)
Sodium: 135 mmol/L (ref 135–145)

## 2021-09-16 LAB — RESP PANEL BY RT-PCR (FLU A&B, COVID) ARPGX2
Influenza A by PCR: NEGATIVE
Influenza B by PCR: NEGATIVE
SARS Coronavirus 2 by RT PCR: NEGATIVE

## 2021-09-16 LAB — CBC WITH DIFFERENTIAL/PLATELET
Abs Immature Granulocytes: 0.02 10*3/uL (ref 0.00–0.07)
Basophils Absolute: 0 10*3/uL (ref 0.0–0.1)
Basophils Relative: 0 %
Eosinophils Absolute: 0.1 10*3/uL (ref 0.0–0.5)
Eosinophils Relative: 1 %
HCT: 35.4 % — ABNORMAL LOW (ref 36.0–46.0)
Hemoglobin: 11.7 g/dL — ABNORMAL LOW (ref 12.0–15.0)
Immature Granulocytes: 0 %
Lymphocytes Relative: 14 %
Lymphs Abs: 1 10*3/uL (ref 0.7–4.0)
MCH: 28.7 pg (ref 26.0–34.0)
MCHC: 33.1 g/dL (ref 30.0–36.0)
MCV: 86.8 fL (ref 80.0–100.0)
Monocytes Absolute: 0.8 10*3/uL (ref 0.1–1.0)
Monocytes Relative: 10 %
Neutro Abs: 5.7 10*3/uL (ref 1.7–7.7)
Neutrophils Relative %: 75 %
Platelets: 202 10*3/uL (ref 150–400)
RBC: 4.08 MIL/uL (ref 3.87–5.11)
RDW: 14.6 % (ref 11.5–15.5)
WBC: 7.6 10*3/uL (ref 4.0–10.5)
nRBC: 0 % (ref 0.0–0.2)

## 2021-09-16 MED ORDER — IPRATROPIUM-ALBUTEROL 0.5-2.5 (3) MG/3ML IN SOLN
3.0000 mL | Freq: Once | RESPIRATORY_TRACT | Status: AC
Start: 2021-09-16 — End: 2021-09-16

## 2021-09-16 MED ORDER — IPRATROPIUM-ALBUTEROL 0.5-2.5 (3) MG/3ML IN SOLN
RESPIRATORY_TRACT | Status: AC
  Administered 2021-09-16: 3 mL via RESPIRATORY_TRACT
  Filled 2021-09-16: qty 3

## 2021-09-16 MED ORDER — PREDNISONE 50 MG PO TABS
60.0000 mg | ORAL_TABLET | Freq: Once | ORAL | Status: AC
  Administered 2021-09-16: 60 mg via ORAL
  Filled 2021-09-16: qty 1

## 2021-09-16 MED ORDER — PREDNISONE 20 MG PO TABS: 40.0000 mg | ORAL_TABLET | Freq: Every day | ORAL | 0 refills | Status: DC

## 2021-09-16 MED ORDER — IPRATROPIUM-ALBUTEROL 0.5-2.5 (3) MG/3ML IN SOLN: 3.0000 mL | Freq: Once | RESPIRATORY_TRACT | Status: AC

## 2021-09-16 NOTE — ED Notes (Signed)
Pt placed on 2L O2 via [place] d/t SpO2 dropping to 89-91%. SpO2 increased with O2 to WNL.  ?

## 2021-09-16 NOTE — Discharge Instructions (Signed)
Your your x-ray today is within normal limits.  There is no sign of pneumonia.  You were given a prescription for prednisone that you will need to start taking tomorrow.  Use your albuterol inhaler as needed and continue to use your Advair.  If you start having fever, a lot worsening shortness of breath, chest pain or other concerns return to the emergency room. ?

## 2021-09-16 NOTE — ED Notes (Signed)
Patient verbalizes understanding of discharge instructions. Opportunity for questioning and answers were provided. Patient discharged from ED.  °

## 2021-09-16 NOTE — ED Notes (Signed)
RT note: Pt. ambulated on room air with continuous pulse ox. Beginning hr-92/91-92% sat. Ending hr-89/96% sat. Remained on room air, RT to monitor. ?

## 2021-09-16 NOTE — ED Notes (Signed)
RT note: Pts. Oxygen titrated down to 1 lpm n/c for trial, is slightly more congested after X2 Duonebs, but states that she is, "breathing better", RT to monitor.  ?

## 2021-09-16 NOTE — ED Provider Notes (Signed)
?[place] EMERGENCY DEPT ?Provider Note ? ? ?CSN: [id number] ?Arrival date & time: 09/16/21  1106 ? ?  ? ?History ? ?Chief Complaint  ?Patient presents with  ? Asthma  ? Neck Pain  ? ? ?[name] is a 81 y.o. female. ? ?Patient is an 81 year old female with a history of hypertension, hyperlipidemia, PE currently only on 81 mg aspirin, asthma, diabetes, CKD who is presenting today with complaints of persistent cough, wheezing and shortness of breath.  Patient reports that 1 week ago she went and saw her doctor because she was having some coughing and feeling short of breath but they reported at that time she sounded clear.  She continues to use her Advair inhaler but reports especially since yesterday she started feeling worse with more coughing, wheezing and shortness of breath.  She has not had any fever, sputum production, chest pain or abdominal pain.  She did try using her albuterol inhaler last night as well but it did not help.  She finally took some cough medicine that did allow her to get some sleep.  However when she woke up this morning she was still having the symptoms and came here for further evaluation. ? ?The history is provided by the patient and medical records.  ?Asthma ? ?Neck Pain ? ?  ? ?Home Medications ?Prior to Admission medications   ?Medication Sig Start Date End Date Taking? Authorizing Provider  ?predniSONE (DELTASONE) 20 MG tablet Take 2 tablets (40 mg total) by mouth daily. 09/16/21  Yes Blanchie Dessert, MD  ?Accu-Chek Softclix Lancets lancets USE TO TEST BLOOD SUGAR TWICE DAILY AS DIRECTED 06/11/21   [name], DO  ?ADVAIR DISKUS 250-50 MCG/DOSE AEPB USE 1 INHALATION BY MOUTH  TWICE DAILY , RINSE MOUTH  AFTER USE 08/13/20   Baird Lyons D, MD  ?albuterol (VENTOLIN HFA) 108 (90 Base) MCG/ACT inhaler Inhale 2 puffs every 6 hours if needed for breathing 05/26/20   Baird Lyons D, MD  ?alendronate (FOSAMAX) 70 MG tablet TAKE 1 TABLET(70 MG) BY MOUTH 1 TIME A  WEEK 05/19/21   [name] R, DO  ?aspirin EC 81 MG tablet Take 81 mg by mouth daily.    [provider]  ?Azelastine-Fluticasone (DYMISTA) 137-50 MCG/ACT SUSP 1-2 puffs each nostril once or twice daily ?Patient taking differently: 1-2 puffs each nostril once or twice daily as needed 05/26/20   Baird Lyons D, MD  ?blood glucose meter kit and supplies KIT Dispense based on patient and insurance preference. Use up to four times daily as directed. 08/11/20   [name], DO  ?Calcium Citrate-Vitamin D (CALCIUM + D PO) Take 1,200 mg by mouth daily.    [provider]  ?celecoxib (CELEBREX) 200 MG capsule TAKE 1 CAPSULE(200 MG) BY MOUTH DAILY 09/09/21   [name], DO  ?cetirizine (ZYRTEC) 10 MG tablet Take 10 mg by mouth daily as needed for allergies.    [provider]  ?Cholecalciferol (VITAMIN D) 50 MCG (2000 UT) CAPS Take 2,000 Units by mouth daily.     [provider]  ?cyclobenzaprine (FLEXERIL) 10 MG tablet TAKE 1 TABLET BY MOUTH THREE TIMES DAILY AS NEEDED FOR HIP CONCERNS 09/02/21   [name], DO  ?diclofenac Sodium (VOLTAREN) 1 % GEL Apply topically daily as needed (Pain).    [provider]  ?ezetimibe (ZETIA) 10 MG tablet TAKE 1 TABLET(10 MG) BY MOUTH DAILY 07/16/21   [name], DO  ?  fenofibrate 160 MG tablet Take 1 tablet (160 mg total) by mouth daily. 07/21/21   Pixie Casino, MD  ?FEROSUL 325 (65 Fe) MG tablet TAKE 1 TABLET(325 MG) BY MOUTH DAILY WITH BREAKFAST 04/05/21   Carollee Herter, Kendrick Fries R, DO  ?glucose blood test strip Use as instructed 09/17/20   Carollee Herter, Alferd Apa, DO  ?icosapent Ethyl (VASCEPA) 1 g capsule TAKE 2 CAPSULES(2 GRAMS) BY MOUTH TWICE DAILY 08/18/21   Hilty, Nadean Corwin, MD  ?Lidocaine 4 % PTCH Apply 1 patch topically as needed (back pain).    [provider]  ?metFORMIN (GLUCOPHAGE-XR) 500 MG 24 hr tablet TAKE 1 TABLET(500 MG) BY MOUTH TWICE DAILY WITH A MEAL 09/15/21   [name], DO  ?mirabegron ER (MYRBETRIQ) 50 MG TB24 tablet Take 1 tablet (50 mg total) by mouth daily. 05/07/21   [name], DO  ?Multiple Vitamin (MULTIVITAMIN WITH MINERALS) TABS tablet Take 1 tablet by mouth daily. Women's One a day - 50+    [provider]  ?Multiple Vitamins-Minerals (PRESERVISION AREDS 2+MULTI VIT PO) Take 1 tablet by mouth in the morning and at bedtime.    [provider]  ?mupirocin ointment (BACTROBAN) 2 % Apply 1 application topically 3 (three) times daily. 11/12/20   [name], DO  ?NONFORMULARY OR COMPOUNDED ITEM Pt for cervical arthritis and low back pain 07/01/21   [name], DO  ?Polyethyl Glycol-Propyl Glycol (SYSTANE OP) Place 1 drop into both eyes daily as needed (dry eyes).    [provider]  ?polyethylene glycol (MIRALAX / GLYCOLAX) packet Take 17 g by mouth daily as needed for mild constipation.     [provider]  ?pregabalin (LYRICA) 100 MG capsule TAKE 1 CAPSULE(100 MG) BY MOUTH THREE TIMES DAILY 04/26/21   Carollee Herter, Alferd Apa, DO  ?traMADol (ULTRAM) 50 MG tablet Take 1 tablet by mouth as needed. 01/25/21   [provider]  ?Trolamine Salicylate (ASPERCREME EX) Apply 1 application topically daily as needed (back pain).    [provider]  ?   ? ?Allergies    ?Statins   ? ?Review of Systems   ?Review of Systems  ?Musculoskeletal:  Positive for neck pain.  ? ?Physical Exam ?Updated Vital Signs ?BP (!) 136/56   Pulse 80   Temp 98.7 ?F (37.1 ?C)   Resp (!) 22   SpO2 93%  ?Physical Exam ?Vitals and nursing note reviewed.  ?Constitutional:   ?   General: She is not in acute distress. ?   Appearance: She is well-developed.  ?HENT:  ?   Head: Normocephalic and atraumatic.  ?Eyes:  ?   Pupils: Pupils are equal, round, and reactive to light.  ?Cardiovascular:  ?   Rate and Rhythm: Normal rate and regular rhythm.  ?   Heart sounds: Normal heart sounds. No murmur heard. ?  No friction rub.   ?Pulmonary:  ?   Effort: Pulmonary effort is normal.  ?   Breath sounds: Decreased air movement present. Wheezing present. No rales.  ?Abdominal:  ?   General: Bowel sounds are normal. There is no distension.  ?   Palpations: Abdomen is soft.  ?   Tenderness: There is no abdominal tenderness. There is no guarding or rebound.  ?Musculoskeletal:     ?   General: No tenderness. Normal range of motion.  ?   Comments: No edema  ?Skin: ?   General: Skin is warm and dry.  ?  Findings: No rash.  ?Neurological:  ?   Mental Status: She is alert and oriented to person, place, and time.  ?   Cranial Nerves: No cranial nerve deficit.  ?Psychiatric:     ?   Mood and Affect: Mood normal.     ?   Behavior: Behavior normal.  ? ? ?ED Results / Procedures / Treatments   ?Labs ?(all labs ordered are listed, but only abnormal results are displayed) ?Labs Reviewed  ?CBC WITH DIFFERENTIAL/PLATELET - Abnormal; Notable for the following components:  ?    Result Value  ? Hemoglobin 11.7 (*)   ? HCT 35.4 (*)   ? All other components within normal limits  ?BASIC METABOLIC PANEL - Abnormal; Notable for the following components:  ? Glucose, Bld 171 (*)   ? Creatinine, Ser 1.09 (*)   ? GFR, Estimated 51 (*)   ? All other components within normal limits  ?RESP PANEL BY RT-PCR (FLU A&B, COVID) [id number]  ? ? ?EKG ?EKG Interpretation ? ?Date/Time:  Thursday September 16 2021 12:22:30 EDT ?Ventricular Rate:  69 ?PR Interval:  191 ?QRS Duration: 103 ?QT Interval:  408 ?QTC Calculation: 438 ?R Axis:   45 ?Text Interpretation: Sinus rhythm Minimal ST depression, inferior leads No significant change since last tracing Confirmed by Blanchie Dessert [phone]) on 09/16/2021 1:57:13 PM ? ?Radiology ?DG Chest Port 1 View ? ?Result Date: 09/16/2021 ?CLINICAL DATA:  History of asthma and shortness of breath EXAM: PORTABLE CHEST 1 VIEW COMPARISON:  May 07, 2021 FINDINGS: The heart size and mediastinal contours are within normal limits. Lungs are mildly hyperinflated  with some interstitial changes. No consolidative airspace disease, pleural effusion or vascular congestion. Again seen are the post cervical fusion changes in the lower cervical spine. IMPRESSION: No active

## 2021-09-16 NOTE — ED Notes (Signed)
RT Note: Pt. placed on 2 lpm n/c by RN due to <'d saturations after initial aerosol admin., RN made this RT aware and notified MD. ?

## 2021-09-16 NOTE — ED Notes (Signed)
RT note: >'d n/c back to original 2 lpm due to saturations remaining 91-93%, RT to monitor. ?

## 2021-09-16 NOTE — ED Triage Notes (Signed)
Patient reports she is having an Asthma flare that she is having trouble getting under control. Patient reports she takes Advair 250-50 and albuterol and she has taken some mucinex to help with the coughing but it still having congestion and some wheezing.  ?

## 2021-09-19 ENCOUNTER — Encounter (HOSPITAL_BASED_OUTPATIENT_CLINIC_OR_DEPARTMENT_OTHER): Payer: Self-pay

## 2021-09-19 ENCOUNTER — Other Ambulatory Visit: Payer: Self-pay

## 2021-09-19 ENCOUNTER — Emergency Department (HOSPITAL_BASED_OUTPATIENT_CLINIC_OR_DEPARTMENT_OTHER)
Admission: EM | Admit: 2021-09-19 | Discharge: 2021-09-19 | Disposition: A | Discharge status: Home / Self Care | Payer: Medicare Other | Attending: Emergency Medicine | Admitting: Emergency Medicine

## 2021-09-19 DIAGNOSIS — Z7952 Long term (current) use of systemic steroids: Secondary | ICD-10-CM | POA: Insufficient documentation

## 2021-09-19 DIAGNOSIS — R0602 Shortness of breath: Secondary | ICD-10-CM | POA: Diagnosis not present

## 2021-09-19 DIAGNOSIS — Z7982 Long term (current) use of aspirin: Secondary | ICD-10-CM | POA: Insufficient documentation

## 2021-09-19 DIAGNOSIS — J4541 Moderate persistent asthma with (acute) exacerbation: Secondary | ICD-10-CM | POA: Insufficient documentation

## 2021-09-19 LAB — CBC WITH DIFFERENTIAL/PLATELET
Abs Immature Granulocytes: 0.15 10*3/uL — ABNORMAL HIGH (ref 0.00–0.07)
Basophils Absolute: 0 10*3/uL (ref 0.0–0.1)
Basophils Relative: 0 %
Eosinophils Absolute: 0 10*3/uL (ref 0.0–0.5)
Eosinophils Relative: 0 %
HCT: 34.9 % — ABNORMAL LOW (ref 36.0–46.0)
Hemoglobin: 11.4 g/dL — ABNORMAL LOW (ref 12.0–15.0)
Immature Granulocytes: 2 %
Lymphocytes Relative: 16 %
Lymphs Abs: 1.6 10*3/uL (ref 0.7–4.0)
MCH: 28 pg (ref 26.0–34.0)
MCHC: 32.7 g/dL (ref 30.0–36.0)
MCV: 85.7 fL (ref 80.0–100.0)
Monocytes Absolute: 0.7 10*3/uL (ref 0.1–1.0)
Monocytes Relative: 7 %
Neutro Abs: 7.4 10*3/uL (ref 1.7–7.7)
Neutrophils Relative %: 75 %
Platelets: 253 10*3/uL (ref 150–400)
RBC: 4.07 MIL/uL (ref 3.87–5.11)
RDW: 14.6 % (ref 11.5–15.5)
WBC: 9.9 10*3/uL (ref 4.0–10.5)
nRBC: 0 % (ref 0.0–0.2)

## 2021-09-19 LAB — BASIC METABOLIC PANEL
Anion gap: 12 (ref 5–15)
BUN: 21 mg/dL (ref 8–23)
CO2: 24 mmol/L (ref 22–32)
Calcium: 9.7 mg/dL (ref 8.9–10.3)
Chloride: 100 mmol/L (ref 98–111)
Creatinine, Ser: 1.1 mg/dL — ABNORMAL HIGH (ref 0.44–1.00)
GFR, Estimated: 51 mL/min — ABNORMAL LOW (ref >60)
Glucose, Bld: 256 mg/dL — ABNORMAL HIGH (ref 70–99)
Potassium: 3.6 mmol/L (ref 3.5–5.1)
Sodium: 136 mmol/L (ref 135–145)

## 2021-09-19 LAB — D-DIMER, QUANTITATIVE: D-Dimer, Quant: 0.31 ug/mL-FEU (ref 0.00–0.50)

## 2021-09-19 MED ORDER — DEXAMETHASONE SODIUM PHOSPHATE 10 MG/ML IJ SOLN
10.0000 mg | Freq: Once | INTRAMUSCULAR | Status: AC
  Administered 2021-09-19: 10 mg via INTRAVENOUS
  Filled 2021-09-19: qty 1

## 2021-09-19 MED ORDER — ALBUTEROL SULFATE (2.5 MG/3ML) 0.083% IN NEBU
5.0000 mg | INHALATION_SOLUTION | Freq: Once | RESPIRATORY_TRACT | Status: AC
  Administered 2021-09-19: 5 mg via RESPIRATORY_TRACT
  Filled 2021-09-19: qty 6

## 2021-09-19 MED ORDER — ALBUTEROL SULFATE (2.5 MG/3ML) 0.083% IN NEBU
5.0000 mg | INHALATION_SOLUTION | Freq: Once | RESPIRATORY_TRACT | Status: DC
Start: 2021-09-19 — End: 2021-09-19

## 2021-09-19 MED ORDER — DEXAMETHASONE SODIUM PHOSPHATE 10 MG/ML IJ SOLN: 10.0000 mg | Freq: Once | INTRAMUSCULAR | Status: DC

## 2021-09-19 MED ORDER — IPRATROPIUM-ALBUTEROL 0.5-2.5 (3) MG/3ML IN SOLN
3.0000 mL | Freq: Once | RESPIRATORY_TRACT | Status: AC
  Administered 2021-09-19: 5 mL via RESPIRATORY_TRACT
  Filled 2021-09-19: qty 6

## 2021-09-19 MED ORDER — IPRATROPIUM-ALBUTEROL 0.5-2.5 (3) MG/3ML IN SOLN
RESPIRATORY_TRACT | Status: AC
  Filled 2021-09-19: qty 3

## 2021-09-19 MED ORDER — ALBUTEROL SULFATE (2.5 MG/3ML) 0.083% IN NEBU
INHALATION_SOLUTION | RESPIRATORY_TRACT | Status: AC
  Filled 2021-09-19: qty 3

## 2021-09-19 NOTE — ED Triage Notes (Signed)
Pt c/o worsening asthma symptoms. Pt was seen on 4/20 for same. Pt reports she still has some ShOB and a "rattle" in her chest. Pt requesting another breathing treatment. Pt has not used her MDI today.  ?

## 2021-09-19 NOTE — ED Provider Notes (Addendum)
?[facility] ?Provider Note ? ? ?CSN: [id number] ?Arrival date & time: 09/19/21  1326 ? ?  ? ?History ? ?Chief Complaint  ?Patient presents with  ? Shortness of Breath  ? ? ?[name] is a 81 y.o. female. ? ?Patient is a 81 year old female who presents with shortness of breath.  She has a history of asthma that started in her 36s after a viral respiratory illness.  She says she does not have any flareups.  However over the last few days she has had worsening wheezing and shortness of breath.  She was seen here 3 days ago for similar symptoms.  At that point she had a chest x-ray that was negative.  She was started on prednisone.  She is currently taking prednisone 40 mg a day and has 2 more days.  She was given nebulizer treatments and is continuing to use her albuterol inhaler at home.  She said that since yesterday she has been worse and has not really gotten any rest due to the shortness of breath and wheezing.  She does not report any associated chest pain.  No leg swelling.  No calf pain.  No fevers.  She has a nonproductive cough.  No new recent exposures. ? ? ?  ? ?Home Medications ?Prior to Admission medications   ?Medication Sig Start Date End Date Taking? Authorizing Provider  ?Accu-Chek Softclix Lancets lancets USE TO TEST BLOOD SUGAR TWICE DAILY AS DIRECTED 06/11/21   [name], DO  ?ADVAIR DISKUS 250-50 MCG/DOSE AEPB USE 1 INHALATION BY MOUTH  TWICE DAILY , RINSE MOUTH  AFTER USE 08/13/20   Baird Lyons D, MD  ?albuterol (VENTOLIN HFA) 108 (90 Base) MCG/ACT inhaler Inhale 2 puffs every 6 hours if needed for breathing 05/26/20   Baird Lyons D, MD  ?alendronate (FOSAMAX) 70 MG tablet TAKE 1 TABLET(70 MG) BY MOUTH 1 TIME A WEEK 05/19/21   [name] R, DO  ?aspirin EC 81 MG tablet Take 81 mg by mouth daily.    [provider]  ?Azelastine-Fluticasone (DYMISTA) 137-50 MCG/ACT SUSP 1-2 puffs each nostril once or twice daily ?Patient taking  differently: 1-2 puffs each nostril once or twice daily as needed 05/26/20   Baird Lyons D, MD  ?blood glucose meter kit and supplies KIT Dispense based on patient and insurance preference. Use up to four times daily as directed. 08/11/20   [name], DO  ?Calcium Citrate-Vitamin D (CALCIUM + D PO) Take 1,200 mg by mouth daily.    [provider]  ?celecoxib (CELEBREX) 200 MG capsule TAKE 1 CAPSULE(200 MG) BY MOUTH DAILY 09/09/21   [name], DO  ?cetirizine (ZYRTEC) 10 MG tablet Take 10 mg by mouth daily as needed for allergies.    [provider]  ?Cholecalciferol (VITAMIN D) 50 MCG (2000 UT) CAPS Take 2,000 Units by mouth daily.     [provider]  ?cyclobenzaprine (FLEXERIL) 10 MG tablet TAKE 1 TABLET BY MOUTH THREE TIMES DAILY AS NEEDED FOR HIP CONCERNS 09/02/21   [name], DO  ?diclofenac Sodium (VOLTAREN) 1 % GEL Apply topically daily as needed (Pain).    [provider]  ?ezetimibe (ZETIA) 10 MG tablet TAKE 1 TABLET(10 MG) BY MOUTH DAILY 07/16/21   [name] R, DO  ?fenofibrate 160 MG tablet Take 1 tablet (160 mg total) by mouth daily. 07/21/21   Pixie Casino, MD  ?FEROSUL 325 (65 Fe) MG tablet  TAKE 1 TABLET(325 MG) BY MOUTH DAILY WITH BREAKFAST 04/05/21   Carollee Herter, Kendrick Fries R, DO  ?glucose blood test strip Use as instructed 09/17/20   Carollee Herter, Alferd Apa, DO  ?icosapent Ethyl (VASCEPA) 1 g capsule TAKE 2 CAPSULES(2 GRAMS) BY MOUTH TWICE DAILY 08/18/21   Hilty, Nadean Corwin, MD  ?Lidocaine 4 % PTCH Apply 1 patch topically as needed (back pain).    [provider]  ?metFORMIN (GLUCOPHAGE-XR) 500 MG 24 hr tablet TAKE 1 TABLET(500 MG) BY MOUTH TWICE DAILY WITH A MEAL 09/15/21   [name], DO  ?mirabegron ER (MYRBETRIQ) 50 MG TB24 tablet Take 1 tablet (50 mg total) by mouth daily. 05/07/21   [name], DO  ?Multiple Vitamin (MULTIVITAMIN WITH MINERALS) TABS tablet Take 1 tablet by mouth daily. Women's  One a day - 50+    [provider]  ?Multiple Vitamins-Minerals (PRESERVISION AREDS 2+MULTI VIT PO) Take 1 tablet by mouth in the morning and at bedtime.    [provider]  ?mupirocin ointment (BACTROBAN) 2 % Apply 1 application topically 3 (three) times daily. 11/12/20   [name], DO  ?NONFORMULARY OR COMPOUNDED ITEM Pt for cervical arthritis and low back pain 07/01/21   [name], DO  ?Polyethyl Glycol-Propyl Glycol (SYSTANE OP) Place 1 drop into both eyes daily as needed (dry eyes).    [provider]  ?polyethylene glycol (MIRALAX / GLYCOLAX) packet Take 17 g by mouth daily as needed for mild constipation.     [provider]  ?predniSONE (DELTASONE) 20 MG tablet Take 2 tablets (40 mg total) by mouth daily. 09/16/21   Blanchie Dessert, MD  ?pregabalin (LYRICA) 100 MG capsule TAKE 1 CAPSULE(100 MG) BY MOUTH THREE TIMES DAILY 04/26/21   Carollee Herter, Alferd Apa, DO  ?traMADol (ULTRAM) 50 MG tablet Take 1 tablet by mouth as needed. 01/25/21   [provider]  ?Trolamine Salicylate (ASPERCREME EX) Apply 1 application topically daily as needed (back pain).    [provider]  ?   ? ?Allergies    ?Statins   ? ?Review of Systems   ?Review of Systems  ?Constitutional:  Positive for fatigue. Negative for chills, diaphoresis and fever.  ?HENT:  Negative for congestion, rhinorrhea and sneezing.   ?Eyes: Negative.   ?Respiratory:  Positive for cough, shortness of breath and wheezing. Negative for chest tightness.   ?Cardiovascular:  Negative for chest pain and leg swelling.  ?Gastrointestinal:  Negative for abdominal pain, blood in stool, diarrhea, nausea and vomiting.  ?Genitourinary:  Negative for difficulty urinating, flank pain, frequency and hematuria.  ?Musculoskeletal:  Negative for arthralgias and back pain.  ?Skin:  Negative for rash.  ?Neurological:  Negative for dizziness, speech difficulty, weakness, numbness and headaches.  ? ?Physical  Exam ?Updated Vital Signs ?BP (!) 143/59   Pulse 78   Temp 98.9 ?F (37.2 ?C)   Resp 18   Ht '5\' 3"'  (1.6 m)   Wt 71.2 kg   SpO2 98%   BMI 27.81 kg/m?  ?Physical Exam ?Constitutional:   ?   Appearance: She is well-developed.  ?HENT:  ?   Head: Normocephalic and atraumatic.  ?Eyes:  ?   Pupils: Pupils are equal, round, and reactive to light.  ?Cardiovascular:  ?   Rate and Rhythm: Normal rate and regular rhythm.  ?   Heart sounds: Normal heart sounds.  ?Pulmonary:  ?   Effort: Pulmonary effort is normal. No respiratory distress.  ?  Breath sounds: Decreased breath sounds and wheezing present. No rales.  ?Chest:  ?   Chest wall: No tenderness.  ?Abdominal:  ?   General: Bowel sounds are normal.  ?   Palpations: Abdomen is soft.  ?   Tenderness: There is no abdominal tenderness. There is no guarding or rebound.  ?Musculoskeletal:     ?   General: Normal range of motion.  ?   Cervical back: Normal range of motion and neck supple.  ?   Comments: No edema or calf tenderness  ?Lymphadenopathy:  ?   Cervical: No cervical adenopathy.  ?Skin: ?   General: Skin is warm and dry.  ?   Findings: No rash.  ?Neurological:  ?   Mental Status: She is alert and oriented to person, place, and time.  ? ? ?ED Results / Procedures / Treatments   ?Labs ?(all labs ordered are listed, but only abnormal results are displayed) ?Labs Reviewed  ?BASIC METABOLIC PANEL - Abnormal; Notable for the following components:  ?    Result Value  ? Glucose, Bld 256 (*)   ? Creatinine, Ser 1.10 (*)   ? GFR, Estimated 51 (*)   ? All other components within normal limits  ?CBC WITH DIFFERENTIAL/PLATELET - Abnormal; Notable for the following components:  ? Hemoglobin 11.4 (*)   ? HCT 34.9 (*)   ? Abs Immature Granulocytes 0.15 (*)   ? All other components within normal limits  ?D-DIMER, QUANTITATIVE  ? ? ?EKG ?EKG Interpretation ? ?Date/Time:  Sunday September 19 2021 14:04:12 EDT ?Ventricular Rate:  69 ?PR Interval:  180 ?QRS Duration: 94 ?QT  Interval:  382 ?QTC Calculation: 409 ?R Axis:   47 ?Text Interpretation: Normal sinus rhythm Normal ECG When compared with ECG of 16-Sep-2021 12:22, PREVIOUS ECG IS PRESENT since last tracing no significant change Confirmed by Belf

## 2021-09-19 NOTE — ED Notes (Signed)
O2 sats 97% while ambulating ?

## 2021-09-22 ENCOUNTER — Other Ambulatory Visit: Payer: Self-pay | Admitting: Internal Medicine

## 2021-09-23 DIAGNOSIS — M542 Cervicalgia: Secondary | ICD-10-CM | POA: Diagnosis not present

## 2021-09-23 DIAGNOSIS — M5481 Occipital neuralgia: Secondary | ICD-10-CM | POA: Diagnosis not present

## 2021-09-24 ENCOUNTER — Other Ambulatory Visit: Payer: Self-pay | Admitting: Internal Medicine

## 2021-09-27 ENCOUNTER — Ambulatory Visit (INDEPENDENT_AMBULATORY_CARE_PROVIDER_SITE_OTHER): Payer: Medicare Other | Admitting: Family Medicine

## 2021-09-27 ENCOUNTER — Encounter: Payer: Self-pay | Admitting: Family Medicine

## 2021-09-27 VITALS — BP 130/70 | HR 67 | Temp 98.1°F | Resp 18 | Ht 63.0 in | Wt 158.8 lb

## 2021-09-27 DIAGNOSIS — J45901 Unspecified asthma with (acute) exacerbation: Secondary | ICD-10-CM | POA: Diagnosis not present

## 2021-09-27 NOTE — Progress Notes (Signed)
? ?Subjective:  ? ?By signing my name below, I, Carylon Perches, attest that this documentation has been prepared under the direction and in the presence of [name] DO, 09/27/2021   ? ? Patient ID: [name], female    DOB: Jun 23, 1940, 81 y.o.   MRN: [id number] ? ?Chief Complaint  ?Patient presents with  ? ER Follow up  ?  Asthma attack  ? ? ?HPI ?Patient is in today for an office visit.  ? ?She was admitted to the ED on 09/16/2021 for moderate persistent asthma with exacerbation. She was prescribed 20 MG of Prednisone but symptoms continued so she was admitted to the ED again on 09/19/2021. She was given Albuterol, Dexamethasone and Ipratropium-Albuterol. Symptoms begun to improve on 09/25/2021. As of today's visit, she has finished her prescribed medications. Her sleep is improving. She is continuing to take Advair twice a day.  ? ?She saw Dr. Saintclair Halsted for pain in her neck on 09/23/2021. She's currently waiting to receive an MRI but symptoms are improving. She mentions that there is a tingling sensation when she turns her neck. ? ?Past Medical History:  ?Diagnosis Date  ? Anemia   ? Arthritis   ? Asthma   ? adult onset  ? Basal cell cancer   ? LUE; Porokeratosis also  ? Chronic kidney disease 1963  ? strep in kidney due to strep throat-hospitalized 10 days  ? Complication of anesthesia   ? small trachea  ? Diabetes mellitus 2010  ? A1c 6.7%  ? DVT (deep venous thrombosis) ([place]) 2006  ? post immobilization post cns surgery  ? GERD (gastroesophageal reflux disease)   ? very mild  ? Granulomatous lung disease ([place]) 2002  ? incidental Xray finding  ? Hyperlipidemia   ? Hypertension 2004  ? Hypertensive response on Stress Test  ? Paralysis [facility]) 2006  ? post cervical fusion with spinal sac tear  with hematoma   ? PTE (pulmonary thromboembolism) ([place]) 2006  ? ? ?Past Surgical History:  ?Procedure Laterality Date  ? ABDOMINAL EXPOSURE N/A 08/12/2016  ? Procedure: ABDOMINAL EXPOSURE;  Surgeon: Rosetta Posner, MD;   Location: [facility];  Service: Vascular;  Laterality: N/A;  ? ANTERIOR LAT LUMBAR FUSION N/A 08/12/2016  ? Procedure: LUMBAR TWO-THREE, LUMBAR THREE-FOUR, LUMBAR FOUR-FIVE  ANTEROLATERAL LUMBAR INTERBODY FUSION;  Surgeon: Erline Levine, MD;  Location: [facility];  Service: Neurosurgery;  Laterality: N/A;  L2-3 L3-4 L4-5 Anterolateral lumbar interbody fusion  ? ANTERIOR LUMBAR FUSION N/A 08/12/2016  ? Procedure: Lumbar Five-Sacral One Anterior lumbar interbody fusion with Dr. Sherren Mocha Early to assist;  Surgeon: Erline Levine, MD;  Location: [facility];  Service: Neurosurgery;  Laterality: N/A;  L5-S1 Anterior lumbar interbody fusion with Dr. Sherren Mocha Early to assist  ? BUNIONECTOMY    ? CATARACT EXTRACTION Right 12/31/2018  ? CATARACT EXTRACTION Left 12/17/2018  ? CERVICAL FUSION  2006  ? Dr Annette Stable, NS;post op hematoma & cns leak & urinary retention  ? COLONOSCOPY  1992 & 2002  ? negative  ? epidural steroids  2006  ? cervical spine  ? LUMBAR PERCUTANEOUS PEDICLE SCREW 4 LEVEL N/A 08/12/2016  ? Procedure: LUMBAR TWO-SACRAL ONE Percuataneous Pedicle Screws;  Surgeon: Erline Levine, MD;  Location: [facility];  Service: Neurosurgery;  Laterality: N/A;  ? ROTATOR CUFF REPAIR  2009  ? Right  ? SEPTOPLASTY    ? TONSILLECTOMY  81 years old  ? TOTAL HIP ARTHROPLASTY  06/01/2012  ? Procedure: TOTAL HIP ARTHROPLASTY ANTERIOR APPROACH;  Surgeon:  ? ?Subjective:  ? ?By signing my name below, I, Carylon Perches, attest that this documentation has been prepared under the direction and in the presence of [name] DO, 09/27/2021   ? ? Patient ID: [name], female    DOB: Jun 23, 1940, 81 y.o.   MRN: [id number] ? ?Chief Complaint  ?Patient presents with  ? ER Follow up  ?  Asthma attack  ? ? ?HPI ?Patient is in today for an office visit.  ? ?She was admitted to the ED on 09/16/2021 for moderate persistent asthma with exacerbation. She was prescribed 20 MG of Prednisone but symptoms continued so she was admitted to the ED again on 09/19/2021. She was given Albuterol, Dexamethasone and Ipratropium-Albuterol. Symptoms begun to improve on 09/25/2021. As of today's visit, she has finished her prescribed medications. Her sleep is improving. She is continuing to take Advair twice a day.  ? ?She saw Dr. Saintclair Halsted for pain in her neck on 09/23/2021. She's currently waiting to receive an MRI but symptoms are improving. She mentions that there is a tingling sensation when she turns her neck. ? ?Past Medical History:  ?Diagnosis Date  ? Anemia   ? Arthritis   ? Asthma   ? adult onset  ? Basal cell cancer   ? LUE; Porokeratosis also  ? Chronic kidney disease 1963  ? strep in kidney due to strep throat-hospitalized 10 days  ? Complication of anesthesia   ? small trachea  ? Diabetes mellitus 2010  ? A1c 6.7%  ? DVT (deep venous thrombosis) ([place]) 2006  ? post immobilization post cns surgery  ? GERD (gastroesophageal reflux disease)   ? very mild  ? Granulomatous lung disease ([place]) 2002  ? incidental Xray finding  ? Hyperlipidemia   ? Hypertension 2004  ? Hypertensive response on Stress Test  ? Paralysis [facility]) 2006  ? post cervical fusion with spinal sac tear  with hematoma   ? PTE (pulmonary thromboembolism) ([place]) 2006  ? ? ?Past Surgical History:  ?Procedure Laterality Date  ? ABDOMINAL EXPOSURE N/A 08/12/2016  ? Procedure: ABDOMINAL EXPOSURE;  Surgeon: Rosetta Posner, MD;   Location: [place] [facility];  Service: Vascular;  Laterality: N/A;  ? ANTERIOR LAT LUMBAR FUSION N/A 08/12/2016  ? Procedure: LUMBAR TWO-THREE, LUMBAR THREE-FOUR, LUMBAR FOUR-FIVE  ANTEROLATERAL LUMBAR INTERBODY FUSION;  Surgeon: Erline Levine, MD;  Location: [place];  Service: Neurosurgery;  Laterality: N/A;  L2-3 L3-4 L4-5 Anterolateral lumbar interbody fusion  ? ANTERIOR LUMBAR FUSION N/A 08/12/2016  ? Procedure: Lumbar Five-Sacral One Anterior lumbar interbody fusion with Dr. Sherren Mocha Early to assist;  Surgeon: Erline Levine, MD;  Location: [place];  Service: Neurosurgery;  Laterality: N/A;  L5-S1 Anterior lumbar interbody fusion with Dr. Sherren Mocha Early to assist  ? BUNIONECTOMY    ? CATARACT EXTRACTION Right 12/31/2018  ? CATARACT EXTRACTION Left 12/17/2018  ? CERVICAL FUSION  2006  ? Dr Annette Stable, NS;post op hematoma & cns leak & urinary retention  ? COLONOSCOPY  1992 & 2002  ? negative  ? epidural steroids  2006  ? cervical spine  ? LUMBAR PERCUTANEOUS PEDICLE SCREW 4 LEVEL N/A 08/12/2016  ? Procedure: LUMBAR TWO-SACRAL ONE Percuataneous Pedicle Screws;  Surgeon: Erline Levine, MD;  Location: [place];  Service: Neurosurgery;  Laterality: N/A;  ? ROTATOR CUFF REPAIR  2009  ? Right  ? SEPTOPLASTY    ? TONSILLECTOMY  81 years old  ? TOTAL HIP ARTHROPLASTY  06/01/2012  ? Procedure: TOTAL HIP ARTHROPLASTY ANTERIOR APPROACH;  Surgeon:  ? ?Subjective:  ? ?By signing my name below, I, Carylon Perches, attest that this documentation has been prepared under the direction and in the presence of [name] DO, 09/27/2021   ? ? Patient ID: [name], female    DOB: Jun 23, 1940, 81 y.o.   MRN: [id number] ? ?Chief Complaint  ?Patient presents with  ? ER Follow up  ?  Asthma attack  ? ? ?HPI ?Patient is in today for an office visit.  ? ?She was admitted to the ED on 09/16/2021 for moderate persistent asthma with exacerbation. She was prescribed 20 MG of Prednisone but symptoms continued so she was admitted to the ED again on 09/19/2021. She was given Albuterol, Dexamethasone and Ipratropium-Albuterol. Symptoms begun to improve on 09/25/2021. As of today's visit, she has finished her prescribed medications. Her sleep is improving. She is continuing to take Advair twice a day.  ? ?She saw Dr. Saintclair Halsted for pain in her neck on 09/23/2021. She's currently waiting to receive an MRI but symptoms are improving. She mentions that there is a tingling sensation when she turns her neck. ? ?Past Medical History:  ?Diagnosis Date  ? Anemia   ? Arthritis   ? Asthma   ? adult onset  ? Basal cell cancer   ? LUE; Porokeratosis also  ? Chronic kidney disease 1963  ? strep in kidney due to strep throat-hospitalized 10 days  ? Complication of anesthesia   ? small trachea  ? Diabetes mellitus 2010  ? A1c 6.7%  ? DVT (deep venous thrombosis) ([place]) 2006  ? post immobilization post cns surgery  ? GERD (gastroesophageal reflux disease)   ? very mild  ? Granulomatous lung disease ([place]) 2002  ? incidental Xray finding  ? Hyperlipidemia   ? Hypertension 2004  ? Hypertensive response on Stress Test  ? Paralysis [facility]) 2006  ? post cervical fusion with spinal sac tear  with hematoma   ? PTE (pulmonary thromboembolism) ([place]) 2006  ? ? ?Past Surgical History:  ?Procedure Laterality Date  ? ABDOMINAL EXPOSURE N/A 08/12/2016  ? Procedure: ABDOMINAL EXPOSURE;  Surgeon: Rosetta Posner, MD;   Location: [place];  Service: Vascular;  Laterality: N/A;  ? ANTERIOR LAT LUMBAR FUSION N/A 08/12/2016  ? Procedure: LUMBAR TWO-THREE, LUMBAR THREE-FOUR, LUMBAR FOUR-FIVE  ANTEROLATERAL LUMBAR INTERBODY FUSION;  Surgeon: Erline Levine, MD;  Location: [place];  Service: Neurosurgery;  Laterality: N/A;  L2-3 L3-4 L4-5 Anterolateral lumbar interbody fusion  ? ANTERIOR LUMBAR FUSION N/A 08/12/2016  ? Procedure: Lumbar Five-Sacral One Anterior lumbar interbody fusion with Dr. Sherren Mocha Early to assist;  Surgeon: Erline Levine, MD;  Location: [place];  Service: Neurosurgery;  Laterality: N/A;  L5-S1 Anterior lumbar interbody fusion with Dr. Sherren Mocha Early to assist  ? BUNIONECTOMY    ? CATARACT EXTRACTION Right 12/31/2018  ? CATARACT EXTRACTION Left 12/17/2018  ? CERVICAL FUSION  2006  ? Dr Annette Stable, NS;post op hematoma & cns leak & urinary retention  ? COLONOSCOPY  1992 & 2002  ? negative  ? epidural steroids  2006  ? cervical spine  ? LUMBAR PERCUTANEOUS PEDICLE SCREW 4 LEVEL N/A 08/12/2016  ? Procedure: LUMBAR TWO-SACRAL ONE Percuataneous Pedicle Screws;  Surgeon: Erline Levine, MD;  Location: [place];  Service: Neurosurgery;  Laterality: N/A;  ? ROTATOR CUFF REPAIR  2009  ? Right  ? SEPTOPLASTY    ? TONSILLECTOMY  81 years old  ? TOTAL HIP ARTHROPLASTY  06/01/2012  ? Procedure: TOTAL HIP ARTHROPLASTY ANTERIOR APPROACH;  Surgeon:

## 2021-09-27 NOTE — Patient Instructions (Signed)
Asthma, Adult ? ?Asthma is a long-term (chronic) condition that causes recurrent episodes in which the lower airways in the lungs become tight and narrow. The narrowing is caused by inflammation and tightening of the smooth muscle around the lower airways. ?Asthma episodes, also called asthma attacks or asthma flares, may cause coughing, making high-pitched whistling sounds when you breathe, most often when you breathe out (wheezing), shortness of breath, and chest pain. The airways may produce extra mucus caused by the inflammation and irritation. During an attack, it can be difficult to breathe. Asthma attacks can range from minor to life-threatening. ?Asthma cannot be cured, but medicines and lifestyle changes can help control it and treat acute attacks. It is important to keep your asthma well controlled so the condition does not interfere with your daily life. ?What are the causes? ?This condition is believed to be caused by inherited (genetic) and environmental factors, but its exact cause is not known. ?What can trigger an asthma attack? ?Many things can bring on an asthma attack or make symptoms worse. These triggers are different for every person. Common triggers include: ?Allergens and irritants like mold, dust, pet dander, cockroaches, pollen, air pollution, and chemical odors. ?Cigarette smoke. ?Weather changes and cold air. ?Stress and strong emotional responses such as crying or laughing hard. ?Certain medications such as aspirin or beta blockers. ?Infections and inflammatory conditions, such as the flu, a cold, pneumonia, or inflammation of the nasal membranes (rhinitis). ?Gastroesophageal reflux disease (GERD). ?What are the signs or symptoms? ?Symptoms may occur right after exposure to an asthma trigger or hours later and can vary by person. Common signs and symptoms include: ?Wheezing. ?Trouble breathing (shortness of breath). ?Excessive nighttime or early morning coughing. ?Chest  tightness. ?Tiredness (fatigue) with minimal activity. ?Difficulty talking in complete sentences. ?Poor exercise tolerance. ?How is this diagnosed? ?This condition is diagnosed based on: ?A physical exam and your medical history. ?Tests, which may include: ?Lung function studies to evaluate the flow of air in your lungs. ?Allergy tests. ?Imaging tests, such as X-rays. ?How is this treated? ?There is no cure, but symptoms can be controlled with proper treatment. Treatment usually involves: ?Identifying and avoiding your asthma triggers. ?Inhaled medicines. Two types are commonly used to treat asthma, depending on severity: ?Controller medicines. These help prevent asthma symptoms from occurring. They are taken every day. ?Fast-acting reliever or rescue medicines. These quickly relieve asthma symptoms. They are used as needed and provide short-term relief. ?Using other medicines, such as: ?Allergy medicines, such as antihistamines, if your asthma attacks are triggered by allergens. ?Immune medicines (immunomodulators). These are medicines that help control the immune system. ?Using supplemental oxygen. This is only needed during a severe episode. ?Creating an asthma action plan. An asthma action plan is a written plan for managing and treating your asthma attacks. This plan includes: ?A list of your asthma triggers and how to avoid them. ?Information about when medicines should be taken and when their dosage should be changed. ?Instructions about using a device called a peak flow meter. A peak flow meter measures how well the lungs are working and the severity of your asthma. It helps you monitor your condition. ?Follow these instructions at home: ?Take over-the-counter and prescription medicines only as told by your health care provider. ?Stay up to date on all vaccinations as recommended by your healthcare provider, including vaccines for the flu and pneumonia. ?Use a peak flow meter and keep track of your peak flow  readings. ?Understand and use your asthma   action plan to address any asthma flares. ?Do not smoke or allow anyone to smoke in your home. ?Contact a health care provider if: ?You have wheezing, shortness of breath, or a cough that is not responding to medicines. ?Your medicines are causing side effects, such as a rash, itching, swelling, or trouble breathing. ?You need to use a reliever medicine more than 2-3 times a week. ?Your peak flow reading is still at 50-79% of your personal best after following your action plan for 1 hour. ?You have a fever and shortness of breath. ?Get help right away if: ?You are getting worse and do not respond to treatment during an asthma attack. ?You are short of breath when at rest or when doing very little physical activity. ?You have difficulty eating, drinking, or talking. ?You have chest pain or tightness. ?You develop a fast heartbeat or palpitations. ?You have a bluish color to your lips or fingernails. ?You are light-headed or dizzy, or you faint. ?Your peak flow reading is less than 50% of your personal best. ?You feel too tired to breathe normally. ?These symptoms may be an emergency. Get help right away. Call 911. ?Do not wait to see if the symptoms will go away. ?Do not drive yourself to the hospital. ?Summary ?Asthma is a long-term (chronic) condition that causes recurrent episodes in which the airways become tight and narrow. Asthma episodes, also called asthma attacks or asthma flares, can cause coughing, wheezing, shortness of breath, and chest pain. ?Asthma cannot be cured, but medicines and lifestyle changes can help keep it well controlled and prevent asthma flares. ?Make sure you understand how to avoid triggers and how and when to use your medicines. ?Asthma attacks can range from minor to life-threatening. Get help right away if you have an asthma attack and do not respond to treatment with your usual rescue medicines. ?This information is not intended to replace  advice given to you by your health care provider. Make sure you discuss any questions you have with your health care provider. ?Document Revised: 03/03/2021 Document Reviewed: 02/22/2021 ?[employer] Patient Education ? 2023 [employer] ? ?

## 2021-09-27 NOTE — Assessment & Plan Note (Signed)
Pt doing much better ?F/u with pulmonary  ?con't albuterol and advair  ?

## 2021-09-28 DIAGNOSIS — M47812 Spondylosis without myelopathy or radiculopathy, cervical region: Secondary | ICD-10-CM | POA: Diagnosis not present

## 2021-09-28 DIAGNOSIS — R202 Paresthesia of skin: Secondary | ICD-10-CM | POA: Diagnosis not present

## 2021-09-28 DIAGNOSIS — M542 Cervicalgia: Secondary | ICD-10-CM | POA: Diagnosis not present

## 2021-10-01 ENCOUNTER — Other Ambulatory Visit: Payer: Self-pay | Admitting: Family Medicine

## 2021-10-01 DIAGNOSIS — D509 Iron deficiency anemia, unspecified: Secondary | ICD-10-CM

## 2021-10-11 ENCOUNTER — Other Ambulatory Visit: Payer: Self-pay | Admitting: Internal Medicine

## 2021-10-12 ENCOUNTER — Other Ambulatory Visit: Payer: Self-pay | Admitting: Internal Medicine

## 2021-10-13 ENCOUNTER — Telehealth: Payer: Medicare Other

## 2021-10-19 ENCOUNTER — Ambulatory Visit: Payer: Medicare Other | Admitting: Internal Medicine

## 2021-10-26 ENCOUNTER — Other Ambulatory Visit: Payer: Self-pay | Admitting: Internal Medicine

## 2021-10-26 DIAGNOSIS — M5481 Occipital neuralgia: Secondary | ICD-10-CM | POA: Diagnosis not present

## 2021-10-26 DIAGNOSIS — M542 Cervicalgia: Secondary | ICD-10-CM | POA: Diagnosis not present

## 2021-10-28 ENCOUNTER — Other Ambulatory Visit: Payer: Self-pay | Admitting: Family Medicine

## 2021-10-28 DIAGNOSIS — E1169 Type 2 diabetes mellitus with other specified complication: Secondary | ICD-10-CM

## 2021-10-28 DIAGNOSIS — E1140 Type 2 diabetes mellitus with diabetic neuropathy, unspecified: Secondary | ICD-10-CM

## 2021-10-29 ENCOUNTER — Other Ambulatory Visit: Payer: Self-pay | Admitting: Family Medicine

## 2021-10-29 ENCOUNTER — Ambulatory Visit (INDEPENDENT_AMBULATORY_CARE_PROVIDER_SITE_OTHER): Payer: Medicare Other | Admitting: Pharmacist

## 2021-10-29 DIAGNOSIS — E782 Mixed hyperlipidemia: Secondary | ICD-10-CM

## 2021-10-29 DIAGNOSIS — M8580 Other specified disorders of bone density and structure, unspecified site: Secondary | ICD-10-CM

## 2021-10-29 DIAGNOSIS — E1165 Type 2 diabetes mellitus with hyperglycemia: Secondary | ICD-10-CM

## 2021-10-29 DIAGNOSIS — E1140 Type 2 diabetes mellitus with diabetic neuropathy, unspecified: Secondary | ICD-10-CM

## 2021-10-29 DIAGNOSIS — J45901 Unspecified asthma with (acute) exacerbation: Secondary | ICD-10-CM

## 2021-10-29 MED ORDER — PREGABALIN 100 MG PO CAPS: ORAL_CAPSULE | ORAL | 0 refills | Status: DC
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
## 2021-10-29 NOTE — Patient Instructions (Signed)
[name] It was a pleasure speaking with you  Below is a summary of your health goals and care plan  Patient Goals/Self-Care Activities take medications as prescribed, check glucose every other day, document, and provide at future appointments, Increase exercise with a target a minimum of 150 minutes of moderate intensity exercise weekly as able Get bivalent COVID booster - depending on recommendation from orthopedist office.  Recheck DEXA (Bone density test)  after 02/2022     If you have any questions or concerns, please feel free to contact me either at the phone number below or with a MyChart message.   Keep up the good work!  Cherre Robins, PharmD Clinical Pharmacist [place] [phone] (direct line)  [phone] (main office number)   The patient verbalized understanding of instructions, educational materials, and care plan provided today and agreed to receive a mailed copy of patient instructions, educational materials, and care plan.

## 2021-11-01 DIAGNOSIS — M4120 Other idiopathic scoliosis, site unspecified: Secondary | ICD-10-CM | POA: Diagnosis not present

## 2021-11-01 DIAGNOSIS — M5481 Occipital neuralgia: Secondary | ICD-10-CM | POA: Diagnosis not present
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
## 2021-11-23 ENCOUNTER — Telehealth: Payer: Self-pay | Admitting: Internal Medicine

## 2021-11-23 ENCOUNTER — Ambulatory Visit: Payer: Medicare Other | Admitting: Internal Medicine

## 2021-11-23 ENCOUNTER — Encounter: Payer: Self-pay | Admitting: Internal Medicine

## 2021-11-23 VITALS — BP 140/60 | HR 66 | Temp 97.8°F | Ht 63.5 in | Wt 158.2 lb

## 2021-11-23 DIAGNOSIS — J3089 Other allergic rhinitis: Secondary | ICD-10-CM | POA: Diagnosis not present

## 2021-11-23 DIAGNOSIS — J302 Other seasonal allergic rhinitis: Secondary | ICD-10-CM

## 2021-11-23 DIAGNOSIS — J4520 Mild intermittent asthma, uncomplicated: Secondary | ICD-10-CM

## 2021-11-23 MED ORDER — ALBUTEROL SULFATE (2.5 MG/3ML) 0.083% IN NEBU: 2.5000 mg | INHALATION_SOLUTION | Freq: Four times a day (QID) | RESPIRATORY_TRACT | 12 refills | Status: DC | PRN

## 2021-11-23 MED ORDER — AZELASTINE HCL 0.1 % NA SOLN: NASAL | 12 refills | Status: AC

## 2021-11-23 MED ORDER — AZELASTINE HCL 0.1 % NA SOLN: NASAL | 12 refills | Status: DC

## 2021-11-26 DIAGNOSIS — J45909 Unspecified asthma, uncomplicated: Secondary | ICD-10-CM

## 2021-11-26 DIAGNOSIS — E1169 Type 2 diabetes mellitus with other specified complication: Secondary | ICD-10-CM

## 2021-11-26 DIAGNOSIS — R0989 Other specified symptoms and signs involving the circulatory and respiratory systems: Secondary | ICD-10-CM

## 2021-11-26 DIAGNOSIS — Z7984 Long term (current) use of oral hypoglycemic drugs: Secondary | ICD-10-CM

## 2021-11-26 DIAGNOSIS — E785 Hyperlipidemia, unspecified: Secondary | ICD-10-CM | POA: Diagnosis not present

## 2021-12-10 ENCOUNTER — Other Ambulatory Visit: Payer: Self-pay | Admitting: Family Medicine

## 2021-12-10 DIAGNOSIS — E1165 Type 2 diabetes mellitus with hyperglycemia: Secondary | ICD-10-CM

## 2021-12-11 ENCOUNTER — Other Ambulatory Visit: Payer: Self-pay | Admitting: Internal Medicine

## 2021-12-13 ENCOUNTER — Encounter: Payer: Self-pay | Admitting: Family Medicine

## 2021-12-13 ENCOUNTER — Ambulatory Visit (INDEPENDENT_AMBULATORY_CARE_PROVIDER_SITE_OTHER): Payer: Medicare Other | Admitting: Family Medicine

## 2021-12-13 VITALS — BP 130/82 | HR 60 | Temp 97.8°F | Resp 18 | Ht 63.5 in | Wt 159.8 lb

## 2021-12-13 DIAGNOSIS — I10 Essential (primary) hypertension: Secondary | ICD-10-CM

## 2021-12-13 DIAGNOSIS — N3281 Overactive bladder: Secondary | ICD-10-CM | POA: Diagnosis not present

## 2021-12-13 DIAGNOSIS — E2839 Other primary ovarian failure: Secondary | ICD-10-CM | POA: Diagnosis not present

## 2021-12-13 DIAGNOSIS — E782 Mixed hyperlipidemia: Secondary | ICD-10-CM

## 2021-12-13 DIAGNOSIS — E1140 Type 2 diabetes mellitus with diabetic neuropathy, unspecified: Secondary | ICD-10-CM

## 2021-12-13 DIAGNOSIS — E1151 Type 2 diabetes mellitus with diabetic peripheral angiopathy without gangrene: Secondary | ICD-10-CM | POA: Diagnosis not present

## 2021-12-13 LAB — CBC WITH DIFFERENTIAL/PLATELET
Basophils Absolute: 0 10*3/uL (ref 0.0–0.1)
Basophils Relative: 0.7 % (ref 0.0–3.0)
Eosinophils Absolute: 0.1 10*3/uL (ref 0.0–0.7)
Eosinophils Relative: 1.8 % (ref 0.0–5.0)
HCT: 34.9 % — ABNORMAL LOW (ref 36.0–46.0)
Hemoglobin: 12 g/dL (ref 12.0–15.0)
Lymphocytes Relative: 32.5 % (ref 12.0–46.0)
Lymphs Abs: 2.2 10*3/uL (ref 0.7–4.0)
MCHC: 34.4 g/dL (ref 30.0–36.0)
MCV: 89.5 fl (ref 78.0–100.0)
Monocytes Absolute: 0.5 10*3/uL (ref 0.1–1.0)
Monocytes Relative: 8 % (ref 3.0–12.0)
Neutro Abs: 3.9 10*3/uL (ref 1.4–7.7)
Neutrophils Relative %: 57 % (ref 43.0–77.0)
Platelets: 245 10*3/uL (ref 150.0–400.0)
RBC: 3.9 Mil/uL (ref 3.87–5.11)
RDW: 15.2 % (ref 11.5–15.5)
WBC: 6.8 10*3/uL (ref 4.0–10.5)

## 2021-12-13 LAB — COMPREHENSIVE METABOLIC PANEL
ALT: 25 U/L (ref 0–35)
AST: 27 U/L (ref 0–37)
Albumin: 4.2 g/dL (ref 3.5–5.2)
Alkaline Phosphatase: 37 U/L — ABNORMAL LOW (ref 39–117)
BUN: 22 mg/dL (ref 6–23)
CO2: 28 mEq/L (ref 19–32)
Calcium: 9.6 mg/dL (ref 8.4–10.5)
Chloride: 103 mEq/L (ref 96–112)
Creatinine, Ser: 1.07 mg/dL (ref 0.40–1.20)
GFR: 48.93 mL/min — ABNORMAL LOW (ref >60.00)
Glucose, Bld: 128 mg/dL — ABNORMAL HIGH (ref 70–99)
Potassium: 4.4 mEq/L (ref 3.5–5.1)
Sodium: 139 mEq/L (ref 135–145)
Total Bilirubin: 0.4 mg/dL (ref 0.2–1.2)
Total Protein: 6.3 g/dL (ref 6.0–8.3)

## 2021-12-13 LAB — HEMOGLOBIN A1C: Hgb A1c MFr Bld: 7.3 % — ABNORMAL HIGH (ref 4.6–6.5)

## 2021-12-13 LAB — LIPID PANEL
Cholesterol: 176 mg/dL (ref 0–200)
HDL: 67.3 mg/dL (ref >39.00)
LDL Cholesterol: 88 mg/dL (ref 0–99)
NonHDL: 108.38
Total CHOL/HDL Ratio: 3
Triglycerides: 100 mg/dL (ref 0.0–149.0)
VLDL: 20 mg/dL (ref 0.0–40.0)

## 2021-12-13 MED ORDER — MIRABEGRON ER 50 MG PO TB24: 50.0000 mg | ORAL_TABLET | Freq: Every day | ORAL | 3 refills | Status: DC

## 2021-12-13 NOTE — Assessment & Plan Note (Signed)
hgba1c acceptable, minimize simple carbs. Increase exercise as tolerated. Continue current meds 

## 2021-12-13 NOTE — Assessment & Plan Note (Signed)
Encourage heart healthy diet such as MIND or DASH diet, increase exercise, avoid trans fats, simple carbohydrates and processed foods, consider a krill or fish or flaxseed oil cap daily.  °

## 2021-12-13 NOTE — Assessment & Plan Note (Signed)
Well controlled, no changes to meds. Encouraged heart healthy diet such as the DASH diet and exercise as tolerated.  °

## 2021-12-13 NOTE — Patient Instructions (Signed)

## 2021-12-13 NOTE — Progress Notes (Signed)
Subjective:   By signing my name below, I, Carylon Perches, attest that this documentation has been prepared under the direction and in the presence of [name] DO 12/13/2021   Patient ID: [name], female    DOB: 08-15-1940, 81 y.o.   MRN: [id number]  Chief Complaint  Patient presents with   Hyperlipidemia   Diabetes   Follow-up    HPI Patient is in today for an office visit.  She is requesting a refill of 50 MG of Mirabegron ER.   She states that she received an injection for her occipital neuralgia symptoms. She reports no more pain.   She is scheduled to see her cardiologist, Dr. Debara Pickett, on 03/04/2022.  She is regularly checking her blood sugar levels and reports a range of 110 - 115 mg/dL.  Lab Results  Component Value Date   HGBA1C 7.3 (H) 12/13/2021   She is requesting to get a Dexa scan.   She has a callous and states that she has a regular podiatrist that she sees. She uses a foot cream to help symptoms.   Past Medical History:  Diagnosis Date   Anemia    Arthritis    Asthma    adult onset   Basal cell cancer    LUE; Porokeratosis also   Chronic kidney disease 1963   strep in kidney due to strep throat-hospitalized 10 days   Complication of anesthesia    small trachea   Diabetes mellitus 2010   A1c 6.7%   DVT (deep venous thrombosis) ([place]) 2006   post immobilization post cns surgery   GERD (gastroesophageal reflux disease)    very mild   Granulomatous lung disease ([place]) 2002   incidental Xray finding   Hyperlipidemia    Hypertension 2004   Hypertensive response on Stress Test   Paralysis ([place]) 2006   post cervical fusion with spinal sac tear  with hematoma    PTE (pulmonary thromboembolism) ([place]) 2006    Past Surgical History:  Procedure Laterality Date   ABDOMINAL EXPOSURE N/A 08/12/2016   Procedure: ABDOMINAL EXPOSURE;  Surgeon: Rosetta Posner, MD;  Location: [facility];  Service: Vascular;  Laterality: N/A;   ANTERIOR LAT LUMBAR FUSION  N/A 08/12/2016   Procedure: LUMBAR TWO-THREE, LUMBAR THREE-FOUR, LUMBAR FOUR-FIVE  ANTEROLATERAL LUMBAR INTERBODY FUSION;  Surgeon: Erline Levine, MD;  Location: [facility];  Service: Neurosurgery;  Laterality: N/A;  L2-3 L3-4 L4-5 Anterolateral lumbar interbody fusion   ANTERIOR LUMBAR FUSION N/A 08/12/2016   Procedure: Lumbar Five-Sacral One Anterior lumbar interbody fusion with Dr. Sherren Mocha Early to assist;  Surgeon: Erline Levine, MD;  Location: [facility];  Service: Neurosurgery;  Laterality: N/A;  L5-S1 Anterior lumbar interbody fusion with Dr. Sherren Mocha Early to assist   BUNIONECTOMY     CATARACT EXTRACTION Right 12/31/2018   CATARACT EXTRACTION Left 12/17/2018   CERVICAL FUSION  2006   Dr Annette Stable, NS;post op hematoma & cns leak & urinary retention   COLONOSCOPY  1992 & 2002   negative   epidural steroids  2006   cervical spine   LUMBAR PERCUTANEOUS PEDICLE SCREW 4 LEVEL N/A 08/12/2016   Procedure: LUMBAR TWO-SACRAL ONE Percuataneous Pedicle Screws;  Surgeon: Erline Levine, MD;  Location: [facility];  Service: Neurosurgery;  Laterality: N/A;   ROTATOR CUFF REPAIR  2009   Right   SEPTOPLASTY     TONSILLECTOMY  81 years old   TOTAL HIP ARTHROPLASTY  06/01/2012   Procedure: TOTAL HIP ARTHROPLASTY ANTERIOR APPROACH;  Surgeon: Mcarthur Rossetti, MD;  Location: [facility];  Service: Orthopedics;  Laterality: Left;  Left Total Hip Arthroplasty   TOTAL HIP ARTHROPLASTY Right 02/23/2018   Procedure: RIGHT TOTAL HIP ARTHROPLASTY ANTERIOR APPROACH;  Surgeon: Mcarthur Rossetti, MD;  Location: [facility];  Service: Orthopedics;  Laterality: Right;   TUBAL LIGATION      Family History  Problem Relation Age of Onset   COPD Father        emphysema   Cancer Mother        cns cancer   Diabetes Sister        TWIN sister ; also Fibromyalgia ; S/P stent 2004   Heart disease Sister        stents @ 35 & 14   Stroke Maternal Grandmother        in  late 68s   Transient ischemic attack Paternal Aunt     Social History    Socioeconomic History   Marital status: Divorced    Spouse name: Not on file   Number of children: Not on file   Years of education: Not on file   Highest education level: Not on file  Occupational History   Not on file  Tobacco Use   Smoking status: Never    Passive exposure: Never   Smokeless tobacco: Never  Vaping Use   Vaping Use: Never used  Substance and Sexual Activity   Alcohol use: Yes    Alcohol/week: 1.0 standard drink of alcohol    Types: 1 Glasses of wine per week    Comment: socially   Drug use: No   Sexual activity: Never  Other Topics Concern   Not on file  Social History Narrative   Not on file   Social Determinants of Health   Financial Resource Strain: Low Risk  (10/29/2021)   Overall Financial Resource Strain (CARDIA)    Difficulty of Paying Living Expenses: Not hard at all  Food Insecurity: No Food Insecurity (07/09/2021)   Hunger Vital Sign    Worried About Running Out of Food in the Last Year: Never true    Ran Out of Food in the Last Year: Never true  Transportation Needs: No Transportation Needs (06/14/2021)   PRAPARE - Hydrologist (Medical): No    Lack of Transportation (Non-Medical): No  Physical Activity: Insufficiently Active (10/29/2021)   Exercise Vital Sign    Days of Exercise per Week: 4 days    Minutes of Exercise per Session: 30 min  Stress: No Stress Concern Present (07/09/2021)   Cool    Feeling of Stress : Not at all  Social Connections: Moderately Isolated (10/29/2021)   Social Connection and Isolation Panel [NHANES]    Frequency of Communication with Friends and Family: Three times a week    Frequency of Social Gatherings with Friends and Family: More than three times a week    Attends Religious Services: Never    Marine scientist or Organizations: Yes    Attends Music therapist: More than 4 times per year     Marital Status: Divorced  Human resources officer Violence: Not on file    Outpatient Medications Prior to Visit  Medication Sig Dispense Refill   Accu-Chek Softclix Lancets lancets USE TO TEST BLOOD SUGAR TWICE DAILY AS DIRECTED 100 each 12   ADVAIR DISKUS 250-50 MCG/ACT AEPB USE 1 INHALATION BY MOUTH  TWICE DAILY RINSE MOUTH  AFTER USE 180 each 3   albuterol (PROVENTIL) (2.5 MG/3ML) 0.083% nebulizer solution Take 3 mLs (2.5 mg total) by nebulization every 6 (six) hours as needed for wheezing or shortness of breath. 75 mL 12   albuterol (VENTOLIN HFA) 108 (90 Base) MCG/ACT inhaler USE 2 INHALATIONS BY MOUTH  EVERY 4 TO 6 HOURS AS  NEEDED 51 g 3   alendronate (FOSAMAX) 70 MG tablet TAKE 1 TABLET(70 MG) BY MOUTH 1 TIME A WEEK 12 tablet 3   aspirin EC 81 MG tablet Take 81 mg by mouth daily.     azelastine (ASTELIN) 0.1 % nasal spray Use 1-2 puffs in each nostril once or twice daily 30 mL 12   blood glucose meter kit and supplies KIT Dispense based on patient and insurance preference. Use up to four times daily as directed. 1 each 0   Calcium Citrate-Vitamin D (CALCIUM + D PO) Take 600 mg by mouth in the morning and at bedtime.     celecoxib (CELEBREX) 200 MG capsule TAKE 1 CAPSULE(200 MG) BY MOUTH DAILY 90 capsule 3   cetirizine (ZYRTEC) 10 MG tablet Take 10 mg by mouth daily as needed for allergies.     Cholecalciferol (VITAMIN D) 50 MCG (2000 UT) CAPS Take 2,000 Units by mouth daily.      cyclobenzaprine (FLEXERIL) 10 MG tablet TAKE 1 TABLET BY MOUTH THREE TIMES DAILY AS NEEDED FOR HIP CONCERNS 30 tablet 2   diclofenac Sodium (VOLTAREN) 1 % GEL Apply topically daily as needed (Pain).     ezetimibe (ZETIA) 10 MG tablet TAKE 1 TABLET(10 MG) BY MOUTH DAILY 90 tablet 1   fenofibrate 160 MG tablet Take 1 tablet (160 mg total) by mouth daily. 90 tablet 3   FEROSUL 325 (65 Fe) MG tablet TAKE 1 TABLET(325 MG) BY MOUTH DAILY WITH BREAKFAST 90 tablet 1   glucose blood test strip Use as instructed 100 each 12    icosapent Ethyl (VASCEPA) 1 g capsule TAKE 2 CAPSULES(2 GRAMS) BY MOUTH TWICE DAILY 120 capsule 1   Lidocaine 4 % PTCH Apply 1 patch topically as needed (back pain).     metFORMIN (GLUCOPHAGE-XR) 500 MG 24 hr tablet TAKE 1 TABLET(500 MG) BY MOUTH TWICE DAILY WITH A MEAL 180 tablet 0   Multiple Vitamin (MULTIVITAMIN WITH MINERALS) TABS tablet Take 1 tablet by mouth daily. Centrum One a day - 50+     Multiple Vitamins-Minerals (PRESERVISION AREDS 2+MULTI VIT PO) Take 1 tablet by mouth in the morning and at bedtime.     mupirocin ointment (BACTROBAN) 2 % Apply 1 application topically 3 (three) times daily. 30 g 3   NONFORMULARY OR COMPOUNDED ITEM Pt for cervical arthritis and low back pain 1 each 0   Polyethyl Glycol-Propyl Glycol (SYSTANE OP) Place 1 drop into both eyes daily as needed (dry eyes).     polyethylene glycol (MIRALAX / GLYCOLAX) packet Take 17 g by mouth daily as needed for mild constipation.      pregabalin (LYRICA) 100 MG capsule 1 po tid (Patient taking differently: Take 100 mg by mouth 3 (three) times daily.) 270 capsule 0   traMADol (ULTRAM) 50 MG tablet Take 1 tablet by mouth as needed.     Trolamine Salicylate (ASPERCREME EX) Apply 1 application topically daily as needed (back pain).     mirabegron ER (MYRBETRIQ) 50 MG TB24 tablet Take 1 tablet (50 mg total) by mouth daily. 90 tablet 3   No facility-administered medications prior to visit.  Allergies  Allergen Reactions   Statins Other (See Comments)    Myalgias and muscle weakness    ROS     Objective:    Physical Exam Constitutional:      General: She is not in acute distress.    Appearance: Normal appearance. She is not ill-appearing.  HENT:     Head: Normocephalic and atraumatic.     Right Ear: External ear normal.     Left Ear: External ear normal.  Eyes:     Extraocular Movements: Extraocular movements intact.     Pupils: Pupils are equal, round, and reactive to light.  Cardiovascular:     Rate and  Rhythm: Normal rate and regular rhythm.     Heart sounds: Normal heart sounds. No murmur heard.    No gallop.  Pulmonary:     Effort: Pulmonary effort is normal. No respiratory distress.     Breath sounds: Normal breath sounds. No wheezing or rales.  Skin:    General: Skin is warm and dry.  Neurological:     Mental Status: She is alert and oriented to person, place, and time.  Psychiatric:        Judgment: Judgment normal.     BP 130/82 (BP Location: Left Arm, Patient Position: Sitting, Cuff Size: Normal)   Pulse 60   Temp 97.8 F (36.6 C) (Oral)   Resp 18   Ht 5' 3.5" (1.613 m)   Wt 159 lb 12.8 oz (72.5 kg)   SpO2 95%   BMI 27.86 kg/m  Wt Readings from Last 3 Encounters:  12/13/21 159 lb 12.8 oz (72.5 kg)  11/23/21 158 lb 3.2 oz (71.8 kg)  09/27/21 158 lb 12.8 oz (72 kg)    Diabetic Foot Exam - Simple   No data filed    Lab Results  Component Value Date   WBC 6.8 12/13/2021   HGB 12.0 12/13/2021   HCT 34.9 (L) 12/13/2021   PLT 245.0 12/13/2021   GLUCOSE 128 (H) 12/13/2021   CHOL 176 12/13/2021   TRIG 100.0 12/13/2021   HDL 67.30 12/13/2021   LDLDIRECT 54 07/15/2021   LDLCALC 88 12/13/2021   ALT 25 12/13/2021   AST 27 12/13/2021   NA 139 12/13/2021   K 4.4 12/13/2021   CL 103 12/13/2021   CREATININE 1.07 12/13/2021   BUN 22 12/13/2021   CO2 28 12/13/2021   TSH 5.38 (H) 06/03/2016   INR 0.96 05/28/2012   HGBA1C 7.3 (H) 12/13/2021   MICROALBUR <0.7 09/09/2021    Lab Results  Component Value Date   TSH 5.38 (H) 06/03/2016   Lab Results  Component Value Date   WBC 6.8 12/13/2021   HGB 12.0 12/13/2021   HCT 34.9 (L) 12/13/2021   MCV 89.5 12/13/2021   PLT 245.0 12/13/2021   Lab Results  Component Value Date   NA 139 12/13/2021   K 4.4 12/13/2021   CO2 28 12/13/2021   GLUCOSE 128 (H) 12/13/2021   BUN 22 12/13/2021   CREATININE 1.07 12/13/2021   BILITOT 0.4 12/13/2021   ALKPHOS 37 (L) 12/13/2021   AST 27 12/13/2021   ALT 25 12/13/2021    PROT 6.3 12/13/2021   ALBUMIN 4.2 12/13/2021   CALCIUM 9.6 12/13/2021   ANIONGAP 12 09/19/2021   GFR 48.93 (L) 12/13/2021   Lab Results  Component Value Date   CHOL 176 12/13/2021   Lab Results  Component Value Date   HDL 67.30 12/13/2021   Lab Results  Component Value Date  [place] 88 12/13/2021   Lab Results  Component Value Date   TRIG 100.0 12/13/2021   Lab Results  Component Value Date   CHOLHDL 3 12/13/2021   Lab Results  Component Value Date   HGBA1C 7.3 (H) 12/13/2021       Assessment & Plan:   Problem List Items Addressed This Visit       Unprioritized   Type 2 diabetes mellitus with diabetic neuropathy ([place]) - Primary   Relevant Orders   CBC with Differential/Platelet (Completed)   Comprehensive metabolic panel (Completed)   Hemoglobin A1c (Completed)   Lipid panel (Completed)   HYPERLIPIDEMIA    Encourage heart healthy diet such as MIND or DASH diet, increase exercise, avoid trans fats, simple carbohydrates and processed foods, consider a krill or fish or flaxseed oil cap daily.       Relevant Orders   CBC with Differential/Platelet (Completed)   Comprehensive metabolic panel (Completed)   Hemoglobin A1c (Completed)   Lipid panel (Completed)   Essential hypertension    Well controlled, no changes to meds. Encouraged heart healthy diet such as the DASH diet and exercise as tolerated.       Diabetes mellitus with peripheral vascular disease ([facility])    hgba1c acceptable, minimize simple carbs. Increase exercise as tolerated. Continue current meds      Other Visit Diagnoses     OAB (overactive bladder)       Relevant Medications   mirabegron ER (MYRBETRIQ) 50 MG TB24 tablet   Other Relevant Orders   CBC with Differential/Platelet (Completed)   Comprehensive metabolic panel (Completed)   Hemoglobin A1c (Completed)   Lipid panel (Completed)   Estrogen deficiency       Relevant Orders   DG Bone Density        Meds ordered this  encounter  Medications   mirabegron ER (MYRBETRIQ) 50 MG TB24 tablet    Sig: Take 1 tablet (50 mg total) by mouth daily.    Dispense:  90 tablet    Refill:  3    Requesting 1 year supply    I, [name], DO, personally preformed the services described in this documentation.  All medical record entries made by the scribe were at my direction and in my presence.  I have reviewed the chart and discharge instructions (if applicable) and agree that the record reflects my personal performance and is accurate and complete. 12/13/2021   I,Amber Collins,acting as a scribe for [employer], DO.,have documented all relevant documentation on the behalf of [name], DO,as directed by  [name], DO while in the presence of [name], DO.  [name], DO

## 2021-12-14 ENCOUNTER — Other Ambulatory Visit: Payer: Self-pay | Admitting: Internal Medicine

## 2021-12-16 DIAGNOSIS — M5481 Occipital neuralgia: Secondary | ICD-10-CM | POA: Diagnosis not present

## 2021-12-30 DIAGNOSIS — K134 Granuloma and granuloma-like lesions of oral mucosa: Secondary | ICD-10-CM | POA: Diagnosis not present

## 2021-12-30 NOTE — Assessment & Plan Note (Signed)
Now back to baseline control after flare in April. Plan- continue Advair 250, rescue albuterol

## 2021-12-30 NOTE — Assessment & Plan Note (Signed)
Adequate control now with antihistamines and flonase available

## 2022-01-03 ENCOUNTER — Other Ambulatory Visit: Payer: Self-pay | Admitting: Family Medicine

## 2022-01-03 DIAGNOSIS — E1165 Type 2 diabetes mellitus with hyperglycemia: Secondary | ICD-10-CM

## 2022-01-13 ENCOUNTER — Other Ambulatory Visit: Payer: Self-pay | Admitting: Family Medicine

## 2022-01-13 DIAGNOSIS — G8929 Other chronic pain: Secondary | ICD-10-CM

## 2022-01-13 DIAGNOSIS — M545 Low back pain, unspecified: Secondary | ICD-10-CM

## 2022-01-14 DIAGNOSIS — Z7984 Long term (current) use of oral hypoglycemic drugs: Secondary | ICD-10-CM | POA: Diagnosis not present

## 2022-01-14 DIAGNOSIS — H524 Presbyopia: Secondary | ICD-10-CM | POA: Diagnosis not present

## 2022-01-14 DIAGNOSIS — H40013 Open angle with borderline findings, low risk, bilateral: Secondary | ICD-10-CM | POA: Diagnosis not present

## 2022-01-14 DIAGNOSIS — H52223 Regular astigmatism, bilateral: Secondary | ICD-10-CM | POA: Diagnosis not present

## 2022-01-14 DIAGNOSIS — H35372 Puckering of macula, left eye: Secondary | ICD-10-CM | POA: Diagnosis not present

## 2022-01-14 DIAGNOSIS — H35433 Paving stone degeneration of retina, bilateral: Secondary | ICD-10-CM | POA: Diagnosis not present

## 2022-01-14 DIAGNOSIS — Z961 Presence of intraocular lens: Secondary | ICD-10-CM | POA: Diagnosis not present

## 2022-01-14 DIAGNOSIS — E1165 Type 2 diabetes mellitus with hyperglycemia: Secondary | ICD-10-CM | POA: Diagnosis not present

## 2022-01-14 LAB — HM DIABETES EYE EXAM

## 2022-01-15 ENCOUNTER — Emergency Department (HOSPITAL_BASED_OUTPATIENT_CLINIC_OR_DEPARTMENT_OTHER)
Admission: EM | Admit: 2022-01-15 | Discharge: 2022-01-15 | Disposition: A | Discharge status: Home / Self Care | Payer: Medicare Other | Attending: Emergency Medicine | Admitting: Emergency Medicine

## 2022-01-15 ENCOUNTER — Other Ambulatory Visit: Payer: Self-pay

## 2022-01-15 ENCOUNTER — Encounter (HOSPITAL_BASED_OUTPATIENT_CLINIC_OR_DEPARTMENT_OTHER): Payer: Self-pay | Admitting: Pharmacy Technician

## 2022-01-15 ENCOUNTER — Emergency Department (HOSPITAL_BASED_OUTPATIENT_CLINIC_OR_DEPARTMENT_OTHER): Payer: Medicare Other | Admitting: Radiology

## 2022-01-15 DIAGNOSIS — U071 COVID-19: Secondary | ICD-10-CM | POA: Insufficient documentation

## 2022-01-15 DIAGNOSIS — R0602 Shortness of breath: Secondary | ICD-10-CM | POA: Diagnosis not present

## 2022-01-15 DIAGNOSIS — Z7982 Long term (current) use of aspirin: Secondary | ICD-10-CM | POA: Insufficient documentation

## 2022-01-15 DIAGNOSIS — E119 Type 2 diabetes mellitus without complications: Secondary | ICD-10-CM | POA: Diagnosis not present

## 2022-01-15 DIAGNOSIS — J4521 Mild intermittent asthma with (acute) exacerbation: Secondary | ICD-10-CM | POA: Diagnosis not present

## 2022-01-15 DIAGNOSIS — R791 Abnormal coagulation profile: Secondary | ICD-10-CM | POA: Insufficient documentation

## 2022-01-15 DIAGNOSIS — Z7984 Long term (current) use of oral hypoglycemic drugs: Secondary | ICD-10-CM | POA: Insufficient documentation

## 2022-01-15 DIAGNOSIS — R062 Wheezing: Secondary | ICD-10-CM | POA: Diagnosis not present

## 2022-01-15 LAB — CBC WITH DIFFERENTIAL/PLATELET
Abs Immature Granulocytes: 0.02 10*3/uL (ref 0.00–0.07)
Basophils Absolute: 0 10*3/uL (ref 0.0–0.1)
Basophils Relative: 0 %
Eosinophils Absolute: 0.2 10*3/uL (ref 0.0–0.5)
Eosinophils Relative: 3 %
HCT: 31.9 % — ABNORMAL LOW (ref 36.0–46.0)
Hemoglobin: 10.7 g/dL — ABNORMAL LOW (ref 12.0–15.0)
Immature Granulocytes: 0 %
Lymphocytes Relative: 22 %
Lymphs Abs: 1.4 10*3/uL (ref 0.7–4.0)
MCH: 29.5 pg (ref 26.0–34.0)
MCHC: 33.5 g/dL (ref 30.0–36.0)
MCV: 87.9 fL (ref 80.0–100.0)
Monocytes Absolute: 0.8 10*3/uL (ref 0.1–1.0)
Monocytes Relative: 12 %
Neutro Abs: 4 10*3/uL (ref 1.7–7.7)
Neutrophils Relative %: 63 %
Platelets: 209 10*3/uL (ref 150–400)
RBC: 3.63 MIL/uL — ABNORMAL LOW (ref 3.87–5.11)
RDW: 14.8 % (ref 11.5–15.5)
WBC: 6.4 10*3/uL (ref 4.0–10.5)
nRBC: 0 % (ref 0.0–0.2)

## 2022-01-15 LAB — BASIC METABOLIC PANEL
Anion gap: 8 (ref 5–15)
BUN: 17 mg/dL (ref 8–23)
CO2: 25 mmol/L (ref 22–32)
Calcium: 8.9 mg/dL (ref 8.9–10.3)
Chloride: 105 mmol/L (ref 98–111)
Creatinine, Ser: 1 mg/dL (ref 0.44–1.00)
GFR, Estimated: 57 mL/min — ABNORMAL LOW (ref >60)
Glucose, Bld: 111 mg/dL — ABNORMAL HIGH (ref 70–99)
Potassium: 3.8 mmol/L (ref 3.5–5.1)
Sodium: 138 mmol/L (ref 135–145)

## 2022-01-15 LAB — SARS CORONAVIRUS 2 BY RT PCR: SARS Coronavirus 2 by RT PCR: POSITIVE — AB

## 2022-01-15 LAB — PROTIME-INR
INR: 1.1 (ref 0.8–1.2)
Prothrombin Time: 13.6 seconds (ref 11.4–15.2)

## 2022-01-15 LAB — BRAIN NATRIURETIC PEPTIDE: B Natriuretic Peptide: 43 pg/mL (ref 0.0–100.0)

## 2022-01-15 LAB — TROPONIN I (HIGH SENSITIVITY): Troponin I (High Sensitivity): 5 ng/L (ref <18)

## 2022-01-15 MED ORDER — GUAIFENESIN 100 MG/5ML PO LIQD
100.0000 mg | Freq: Four times a day (QID) | ORAL | 0 refills | Status: DC | PRN
  Filled 2022-01-15: qty 60, 2d supply, fill #0

## 2022-01-15 MED ORDER — NIRMATRELVIR/RITONAVIR (PAXLOVID) TABLET (RENAL DOSING): 2.0000 | ORAL_TABLET | Freq: Two times a day (BID) | ORAL | 0 refills | Status: AC

## 2022-01-15 MED ORDER — NIRMATRELVIR/RITONAVIR (PAXLOVID) TABLET (RENAL DOSING): 2.0000 | ORAL_TABLET | Freq: Two times a day (BID) | ORAL | 0 refills | Status: DC

## 2022-01-15 MED ORDER — ALBUTEROL SULFATE HFA 108 (90 BASE) MCG/ACT IN AERS: 2.0000 | INHALATION_SPRAY | RESPIRATORY_TRACT | Status: DC | PRN

## 2022-01-15 MED ORDER — NIRMATRELVIR/RITONAVIR (PAXLOVID) TABLET (RENAL DOSING)
2.0000 | ORAL_TABLET | Freq: Two times a day (BID) | ORAL | 0 refills | Status: DC
  Filled 2022-01-15: qty 20, 5d supply, fill #0

## 2022-01-15 MED ORDER — GUAIFENESIN 100 MG/5ML PO LIQD: 100.0000 mg | Freq: Four times a day (QID) | ORAL | 0 refills | Status: DC | PRN

## 2022-01-15 MED ORDER — PREDNISONE 10 MG PO TABS
50.0000 mg | ORAL_TABLET | Freq: Every day | ORAL | 0 refills | Status: DC
  Filled 2022-01-15: qty 25, 5d supply, fill #0

## 2022-01-15 MED ORDER — PREDNISONE 10 MG PO TABS: 50.0000 mg | ORAL_TABLET | Freq: Every day | ORAL | 0 refills | Status: DC

## 2022-01-15 NOTE — Discharge Instructions (Addendum)
You were seen in the ER for chest tightness and wheezing. Your COVID-19 test is positive.  You will need to isolate in your room for 5 days, you can return to public spaces, but with the mask for 5 additional days thereafter if you are feeling better.  We have prescribed you medication called Paxlovid, which reduces the risk of COVID-19 getting worse.  Return to the emergency room if you start having increasing shortness of breath, chest pain, severe weakness, nausea, vomiting.  Make sure you are hydrating well.  Take inhaler every 4-6 hours for your wheezing.  We have prescribed prednisone and Mucinex for additional support.

## 2022-01-15 NOTE — ED Triage Notes (Signed)
Pt here with complaints of asthma exacerbation. Used albuterol last night and advair this morning with minimal relief. Pt in NAD, respirations even and unlabored.

## 2022-01-15 NOTE — ED Notes (Signed)
Pt called stating problem with getting prescription. Dr. Kathrynn Humble made aware. RX's to be sent to [facility]. When I spoke with [facility], they had not received the Paxlovid RX. Pt notified to come pick up prescription.

## 2022-01-15 NOTE — ED Notes (Signed)
Patient Alert and oriented to baseline. Stable and ambulatory to baseline. Patient verbalized understanding of the discharge instructions.  Patient belongings were taken by the patient.   
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
## 2022-01-17 ENCOUNTER — Ambulatory Visit: Payer: Self-pay | Admitting: Licensed Clinical Social Worker

## 2022-01-17 ENCOUNTER — Other Ambulatory Visit (HOSPITAL_BASED_OUTPATIENT_CLINIC_OR_DEPARTMENT_OTHER): Payer: Self-pay

## 2022-01-17 NOTE — Patient Instructions (Addendum)
Visit Information  Thank you for taking time to visit with me today. Please don't hesitate to contact me if I can be of assistance to you before our next scheduled telephone appointment.  Following are the goals we discussed today:   No further intervention is needed at present  Please call the care guide team at [phone] if you need to cancel or reschedule your appointment.   If you are experiencing a Mental Health or [place] or need someone to talk to, please go to [facility] [phone])   Following is a copy of your full plan of care:   Care Coordination Interventions:  Active listening / Reflection utilized  Informed client of Care Coordination program support Client said she resides at [facility]. She said her needs are met through [facility]. She was appreciative of call but said she did not need Care Coordination program support at present  Ms. Gaspari was given information about Care Management services by the embedded care coordination team including:  Care Management services include personalized support from designated clinical staff supervised by her physician, including individualized plan of care and coordination with other care providers 24/7 contact phone numbers for assistance for urgent and routine care needs. The patient may stop CCM services at any time (effective at the end of the month) by phone call to the office staff.  Patient agreed to services and verbal consent obtained.   Norva Riffle.Artavious Trebilcock MSW, [place] [facility] representative [facility] Care Management [phone]

## 2022-01-17 NOTE — Patient Outreach (Signed)
  Care Coordination   Initial Visit Note   01/17/2022 Name: [name] MRN: [id number] DOB: 02/13/1941  [name] is a 81 y.o. year old female who sees Carollee Herter, Alferd Apa, DO for primary care. I spoke with  Joesph Fillers by phone today  What matters to the patients health and wellness today?  Client said she was stable and resides at [facility]. She declined program support at this time    Goals Addressed             This Visit's Progress    Patient said she was stable and resides at [facility] She declined program support at present.       Care Coordination Interventions:  Active listening / Reflection utilized  Informed client of Care Coordination program support Client said she resides at [facility]. She said her needs are met through [facility]. She was appreciative of call but said she did not need Care Coordination program support at present     SDOH assessments and interventions completed:  No    Care Coordination Interventions Activated:  No  Care Coordination Interventions:  No, not indicated   Follow up plan: No further intervention required.   Encounter Outcome:  Pt. Visit Completed

## 2022-01-24 DIAGNOSIS — M5416 Radiculopathy, lumbar region: Secondary | ICD-10-CM | POA: Diagnosis not present

## 2022-01-24 DIAGNOSIS — M5481 Occipital neuralgia: Secondary | ICD-10-CM | POA: Diagnosis not present

## 2022-01-25 ENCOUNTER — Telehealth: Payer: Self-pay | Admitting: Family Medicine
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
## 2022-01-28 NOTE — Telephone Encounter (Signed)
How would I do this for a referral?

## 2022-02-04 NOTE — Telephone Encounter (Signed)
[name] called stating she was needing additional information in reference to the referral that was sent to her as well as a signature from the provider approving this in order to ensure coverage by the pt's insurance. After completing this info, this needed to be faxed to the following office with the claim number on the cover sheet:  ATTN: Bonnielee Haff  Fax # [phone] Claim # [id number]

## 2022-02-07 ENCOUNTER — Other Ambulatory Visit: Payer: Self-pay | Admitting: Family Medicine

## 2022-02-07 DIAGNOSIS — E1140 Type 2 diabetes mellitus with diabetic neuropathy, unspecified: Secondary | ICD-10-CM

## 2022-02-07 NOTE — Telephone Encounter (Signed)
Requesting: lyrica Contract: n/a UDS: n/a Last Visit:12/13/21 Next Visit:06/16/2022 Last Refill:10/29/21  Please Advise
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
## 2022-02-09 ENCOUNTER — Other Ambulatory Visit: Payer: Self-pay | Admitting: Family Medicine

## 2022-02-09 DIAGNOSIS — E1165 Type 2 diabetes mellitus with hyperglycemia: Secondary | ICD-10-CM

## 2022-02-15 NOTE — Telephone Encounter (Signed)
Letter faxed to the provided number

## 2022-02-19 ENCOUNTER — Other Ambulatory Visit: Payer: Self-pay | Admitting: Internal Medicine

## 2022-02-21 DIAGNOSIS — E1169 Type 2 diabetes mellitus with other specified complication: Secondary | ICD-10-CM | POA: Diagnosis not present

## 2022-02-21 DIAGNOSIS — E785 Hyperlipidemia, unspecified: Secondary | ICD-10-CM | POA: Diagnosis not present

## 2022-02-22 LAB — LIPID PANEL
Chol/HDL Ratio: 2.5 ratio (ref 0.0–4.4)
Cholesterol, Total: 165 mg/dL (ref 100–199)
HDL: 65 mg/dL (ref >39)
LDL Chol Calc (NIH): 83 mg/dL (ref 0–99)
Triglycerides: 93 mg/dL (ref 0–149)
VLDL Cholesterol Cal: 17 mg/dL (ref 5–40)

## 2022-02-28 ENCOUNTER — Ambulatory Visit (HOSPITAL_BASED_OUTPATIENT_CLINIC_OR_DEPARTMENT_OTHER)
Admission: RE | Admit: 2022-02-28 | Discharge: 2022-02-28 | Disposition: A | Discharge status: Home / Self Care | Payer: Medicare Other | Source: Ambulatory Visit | Attending: Family Medicine | Admitting: Family Medicine

## 2022-02-28 DIAGNOSIS — M85852 Other specified disorders of bone density and structure, left thigh: Secondary | ICD-10-CM | POA: Diagnosis not present

## 2022-02-28 DIAGNOSIS — E2839 Other primary ovarian failure: Secondary | ICD-10-CM | POA: Diagnosis present

## 2022-03-01 ENCOUNTER — Other Ambulatory Visit (HOSPITAL_BASED_OUTPATIENT_CLINIC_OR_DEPARTMENT_OTHER): Payer: Self-pay

## 2022-03-01 MED ORDER — FLUAD QUADRIVALENT 0.5 ML IM PRSY
PREFILLED_SYRINGE | INTRAMUSCULAR | 0 refills | Status: DC
  Filled 2022-03-01: qty 0.5, 1d supply, fill #0

## 2022-03-04 ENCOUNTER — Ambulatory Visit: Payer: Medicare Other | Attending: Internal Medicine | Admitting: Internal Medicine

## 2022-03-04 ENCOUNTER — Encounter: Payer: Self-pay | Admitting: Internal Medicine

## 2022-03-04 VITALS — BP 126/68 | HR 68 | Ht 63.5 in | Wt 157.0 lb

## 2022-03-04 DIAGNOSIS — I10 Essential (primary) hypertension: Secondary | ICD-10-CM | POA: Diagnosis not present

## 2022-03-04 DIAGNOSIS — E1169 Type 2 diabetes mellitus with other specified complication: Secondary | ICD-10-CM

## 2022-03-04 DIAGNOSIS — E785 Hyperlipidemia, unspecified: Secondary | ICD-10-CM | POA: Diagnosis not present

## 2022-03-04 DIAGNOSIS — E781 Pure hyperglyceridemia: Secondary | ICD-10-CM

## 2022-03-04 DIAGNOSIS — I2510 Atherosclerotic heart disease of native coronary artery without angina pectoris: Secondary | ICD-10-CM

## 2022-03-04 NOTE — Patient Instructions (Signed)
Medication Instructions:  Your physician recommends that you continue on your current medications as directed. Please refer to the Current Medication list given to you today.  *If you need a refill on your cardiac medications before your next appointment, please call your pharmacy*   Lab Work: Please return for FASTING labs in 6 months (1 week prior to appointment-Lipid)  Our in office lab hours are Monday-Friday 8:00-4:00, closed for lunch 12:45-1:45 pm.  No appointment needed.  [facility] locations:   [place] [facility] [place] ([place]) - [address] [place] [address] - [address] [place] - [address] ([facility])  Follow-Up: At [facility], you and your health needs are our priority.  As part of our continuing mission to provide you with exceptional heart care, we have created designated Provider Care Teams.  These Care Teams include your primary Cardiologist (physician) and Advanced Practice Providers (APPs -  Physician Assistants and Nurse Practitioners) who all work together to provide you with the care you need, when you need it.  We recommend signing up for the patient portal called "MyChart".  Sign up information is provided on this After Visit Summary.  MyChart is used to connect with patients for Virtual Visits (Telemedicine).  Patients are able to view lab/test results, encounter notes, upcoming appointments, etc.  Non-urgent messages can be sent to your provider as well.   To learn more about what you can do with MyChart, go to [email or web address].    Your next appointment:   6 month(s)  The format for your next appointment:   In Person  Provider:   Dr. Debara Pickett

## 2022-03-04 NOTE — Progress Notes (Signed)
LIPID CLINIC CONSULT NOTE  Chief Complaint:  Follow-up dyslipidemia  Primary Care Physician: [name], [name] Apa, DO  Primary Cardiologist:  None  HPI:  [name] is a 81 y.o. female who is being seen today for the evaluation of dyslipidemia at the request of [name], *. This is a 23-year-old female kindly referred for evaluation management of dyslipidemia.  She has a history of type 2 diabetes, dyslipidemia, mild carotid artery disease and coronary artery calcification seen previously on a CT scan.  She has a twin sister who apparently has coronary disease and has had 3 stents.  Has any history of prior cardiac events or any symptoms such as angina, dyspnea on exertion or decreased exercise tolerance.  Recently she has been residing at [facility].  She says that she eats "very well" there.  Her recent labs show an increase in triglycerides specifically that are quite high at 604, total cholesterol 222 and a direct LDL of 61.  Hemoglobin A1c is 7.1.  She unfortunately is intolerant of simvastatin which she took previously and caused her muscle weakness however at the time she had significant scoliosis and subsequently underwent spinal surgery.  She has not taken any additional statins.  She is currently on ezetimibe.  This is well-tolerated.  She has occasionally taken over-the-counter fish oil but not regularly.  Diet is variable but she does probably get a decent amount of sweets and baked goods.  01/12/2021  [name] is seen today in follow-up.  She has had a significant improvement in her triglycerides from over 600 down to 337, total cholesterol 222 2 and LDL 121.  Direct LDL was measured at 99 however this indicates an increase compared to her previous values.  After discussing with her a little bit she told me that she actually had stopped her ezetimibe and was confused as to whether or not she was supposed to continue that and the Vascepa or the Vascepa was to replace  that.  I informed her that she should have stayed on the ezetimibe.  She does report some recent improvement in her A1c.  She is trying to be more active.  03/19/2021  [name] returns today for follow-up.  Unfortunately she says her diet recently has been not ideal.  Her PCP had reassessed her lipids and shows an increase in her cholesterol.  Triglycerides specifically are now up to 511 with total cholesterol 218 and a direct LDL of 55.  She had restarted her ezetimibe which seems to have helped with her LDL however triglycerides are again high.  She reports compliance with Vascepa.  Her activity level has gone down.  She reports her twin sister is actually hospitalized currently and may have heart catheterization today.  07/21/2021  [name] is seen today in follow-up.  After several months of dietary interventions, her triglycerides remain elevated.  Repeat lipids showed a total cholesterol 211, triglycerides 454 (down slightly from 511), HDL 40 and LDL 95.  Direct LDL measured at 54.  She is on ezetimibe and Vascepa 2 g twice daily.  03/04/2022  [name] returns today for follow-up.  She has done very well on additional therapy for her cholesterol.  I advised adding fenofibrate and she has had an excellent response.  Total cholesterol now 165, down from 211.  Triglycerides are 93, but were as high as 5-600 in the past.  HDL of 65 and LDL of 83.  Overall she is tolerating the medicine  well.  She reported she had COVID-19 in August.  She was treated with Paxlovid and steroids.  She recovered nicely from that.  She has reduced some of her alcohol intake as well to only about once a week.  PMHx:  Past Medical History:  Diagnosis Date   Anemia    Arthritis    Asthma    adult onset   Basal cell cancer    LUE; Porokeratosis also   Chronic kidney disease 1963   strep in kidney due to strep throat-hospitalized 10 days   Complication of anesthesia    small trachea   Diabetes mellitus 2010    A1c 6.7%   DVT (deep venous thrombosis) ([place]) 2006   post immobilization post cns surgery   GERD (gastroesophageal reflux disease)    very mild   Granulomatous lung disease ([place]) 2002   incidental Xray finding   Hyperlipidemia    Hypertension 2004   Hypertensive response on Stress Test   Paralysis ([place]) 2006   post cervical fusion with spinal sac tear  with hematoma    PTE (pulmonary thromboembolism) ([place]) 2006    Past Surgical History:  Procedure Laterality Date   ABDOMINAL EXPOSURE N/A 08/12/2016   Procedure: ABDOMINAL EXPOSURE;  Surgeon: Rosetta Posner, MD;  Location: [facility];  Service: Vascular;  Laterality: N/A;   ANTERIOR LAT LUMBAR FUSION N/A 08/12/2016   Procedure: LUMBAR TWO-THREE, LUMBAR THREE-FOUR, LUMBAR FOUR-FIVE  ANTEROLATERAL LUMBAR INTERBODY FUSION;  Surgeon: Erline Levine, MD;  Location: [facility];  Service: Neurosurgery;  Laterality: N/A;  L2-3 L3-4 L4-5 Anterolateral lumbar interbody fusion   ANTERIOR LUMBAR FUSION N/A 08/12/2016   Procedure: Lumbar Five-Sacral One Anterior lumbar interbody fusion with Dr. Sherren Mocha Early to assist;  Surgeon: Erline Levine, MD;  Location: [facility];  Service: Neurosurgery;  Laterality: N/A;  L5-S1 Anterior lumbar interbody fusion with Dr. Sherren Mocha Early to assist   BUNIONECTOMY     CATARACT EXTRACTION Right 12/31/2018   CATARACT EXTRACTION Left 12/17/2018   CERVICAL FUSION  2006   Dr Annette Stable, NS;post op hematoma & cns leak & urinary retention   COLONOSCOPY  1992 & 2002   negative   epidural steroids  2006   cervical spine   LUMBAR PERCUTANEOUS PEDICLE SCREW 4 LEVEL N/A 08/12/2016   Procedure: LUMBAR TWO-SACRAL ONE Percuataneous Pedicle Screws;  Surgeon: Erline Levine, MD;  Location: [facility];  Service: Neurosurgery;  Laterality: N/A;   ROTATOR CUFF REPAIR  2009   Right   SEPTOPLASTY     TONSILLECTOMY  81 years old   TOTAL HIP ARTHROPLASTY  06/01/2012   Procedure: TOTAL HIP ARTHROPLASTY ANTERIOR APPROACH;  Surgeon: Mcarthur Rossetti, MD;  Location: [facility];   Service: Orthopedics;  Laterality: Left;  Left Total Hip Arthroplasty   TOTAL HIP ARTHROPLASTY Right 02/23/2018   Procedure: RIGHT TOTAL HIP ARTHROPLASTY ANTERIOR APPROACH;  Surgeon: Mcarthur Rossetti, MD;  Location: [facility];  Service: Orthopedics;  Laterality: Right;   TUBAL LIGATION      FAMHx:  Family History  Problem Relation Age of Onset   COPD Father        emphysema   Cancer Mother        cns cancer   Diabetes Sister        TWIN sister ; also Fibromyalgia ; S/P stent 2004   Heart disease Sister        stents @ 61 & 6   Stroke Maternal Grandmother        in  late  36s   Transient ischemic attack Paternal Aunt     SOCHx:   reports that she has never smoked. She has never been exposed to tobacco smoke. She has never used smokeless tobacco. She reports current alcohol use of about 1.0 standard drink of alcohol per week. She reports that she does not use drugs.  ALLERGIES:  Allergies  Allergen Reactions   Statins Other (See Comments)    Myalgias and muscle weakness    ROS: Pertinent items noted in HPI and remainder of comprehensive ROS otherwise negative.  HOME MEDS: Current Outpatient Medications on File Prior to Visit  Medication Sig Dispense Refill   Accu-Chek Softclix Lancets lancets USE TO TEST BLOOD SUGAR TWICE DAILY AS DIRECTED 100 each 12   ADVAIR DISKUS 250-50 MCG/ACT AEPB USE 1 INHALATION BY MOUTH  TWICE DAILY RINSE MOUTH  AFTER USE 180 each 3   albuterol (PROVENTIL) (2.5 MG/3ML) 0.083% nebulizer solution Take 3 mLs (2.5 mg total) by nebulization every 6 (six) hours as needed for wheezing or shortness of breath. 75 mL 12   albuterol (VENTOLIN HFA) 108 (90 Base) MCG/ACT inhaler USE 2 INHALATIONS BY MOUTH  EVERY 4 TO 6 HOURS AS  NEEDED 51 g 3   alendronate (FOSAMAX) 70 MG tablet TAKE 1 TABLET(70 MG) BY MOUTH 1 TIME A WEEK 12 tablet 3   aspirin EC 81 MG tablet Take 81 mg by mouth daily.     azelastine (ASTELIN) 0.1 % nasal spray Use 1-2 puffs in each nostril  once or twice daily 30 mL 12   blood glucose meter kit and supplies KIT Dispense based on patient and insurance preference. Use up to four times daily as directed. 1 each 0   Calcium Citrate-Vitamin D (CALCIUM + D PO) Take 600 mg by mouth in the morning and at bedtime.     celecoxib (CELEBREX) 200 MG capsule TAKE 1 CAPSULE(200 MG) BY MOUTH DAILY 90 capsule 3   cetirizine (ZYRTEC) 10 MG tablet Take 10 mg by mouth daily as needed for allergies.     Cholecalciferol (VITAMIN D) 50 MCG (2000 UT) CAPS Take 2,000 Units by mouth daily.      cyclobenzaprine (FLEXERIL) 10 MG tablet TAKE 1 TABLET BY MOUTH THREE TIMES DAILY AS NEEDED FOR HIP CONCERNS 30 tablet 2   diclofenac Sodium (VOLTAREN) 1 % GEL Apply topically daily as needed (Pain).     ezetimibe (ZETIA) 10 MG tablet TAKE 1 TABLET(10 MG) BY MOUTH DAILY 90 tablet 1   fenofibrate 160 MG tablet Take 1 tablet (160 mg total) by mouth daily. 90 tablet 3   FEROSUL 325 (65 Fe) MG tablet TAKE 1 TABLET(325 MG) BY MOUTH DAILY WITH BREAKFAST 90 tablet 1   glucose blood (ACCU-CHEK GUIDE) test strip USE TO TEST BLOOD SUGAR UP TO TWICE DAILY AS DIRECTED 100 strip 12   guaiFENesin (ROBITUSSIN) 100 MG/5ML liquid Take 5-10 mLs (100-200 mg total) by mouth every 6 (six) hours as needed for cough or to loosen phlegm. 60 mL 0   icosapent Ethyl (VASCEPA) 1 g capsule TAKE 2 CAPSULES(2 GRAMS) BY MOUTH TWICE DAILY 120 capsule 3   influenza vaccine adjuvanted (FLUAD QUADRIVALENT) 0.5 ML injection Inject into the muscle. 0.5 mL 0   Lidocaine 4 % PTCH Apply 1 patch topically as needed (back pain).     metFORMIN (GLUCOPHAGE-XR) 500 MG 24 hr tablet Take 1 tablet (500 mg total) by mouth 2 (two) times daily with a meal. 180 tablet 1   mirabegron ER (MYRBETRIQ)  50 MG TB24 tablet Take 1 tablet (50 mg total) by mouth daily. 90 tablet 3   Multiple Vitamin (MULTIVITAMIN WITH MINERALS) TABS tablet Take 1 tablet by mouth daily. Centrum One a day - 50+     Multiple Vitamins-Minerals  (PRESERVISION AREDS 2+MULTI VIT PO) Take 1 tablet by mouth in the morning and at bedtime.     mupirocin ointment (BACTROBAN) 2 % Apply 1 application topically 3 (three) times daily. 30 g 3   NONFORMULARY OR COMPOUNDED ITEM Pt for cervical arthritis and low back pain 1 each 0   Polyethyl Glycol-Propyl Glycol (SYSTANE OP) Place 1 drop into both eyes daily as needed (dry eyes).     polyethylene glycol (MIRALAX / GLYCOLAX) packet Take 17 g by mouth daily as needed for mild constipation.      predniSONE (DELTASONE) 10 MG tablet Take 5 tablets (50 mg total) by mouth daily. 25 tablet 0   pregabalin (LYRICA) 100 MG capsule TAKE 1 CAPSULE BY MOUTH THREE TIMES DAILY 270 capsule 1   traMADol (ULTRAM) 50 MG tablet Take 1 tablet by mouth as needed.     Trolamine Salicylate (ASPERCREME EX) Apply 1 application topically daily as needed (back pain).     No current facility-administered medications on file prior to visit.    LABS/IMAGING: No results found for this or any previous visit (from the past 48 hour(s)). No results found.  LIPID PANEL:    Component Value Date/Time   CHOL 165 02/21/2022 1017   TRIG 93 02/21/2022 1017   HDL 65 02/21/2022 1017   CHOLHDL 2.5 02/21/2022 1017   CHOLHDL 3 12/13/2021 0934   VLDL 20.0 12/13/2021 0934   LDLCALC 83 02/21/2022 1017   LDLDIRECT 54 07/15/2021 1005   LDLDIRECT 55.0 03/15/2021 1418    WEIGHTS: Wt Readings from Last 3 Encounters:  03/04/22 157 lb (71.2 kg)  12/13/21 159 lb 12.8 oz (72.5 kg)  11/23/21 158 lb 3.2 oz (71.8 kg)    VITALS: BP 126/68 (BP Location: Left Arm, Patient Position: Sitting, Cuff Size: Normal)   Pulse 68   Ht 5' 3.5" (1.613 m)   Wt 157 lb (71.2 kg)   BMI 27.38 kg/m   EXAM: Deferred    EKG: Deferred  ASSESSMENT: Mixed dyslipidemia with high triglycerides Type 2 diabetes-A1c 7.1 Mild carotid artery disease Coronary artery calcification on CT scan Family history of coronary artery disease in her twin  sister  PLAN: 1.   Ms. Bernard has had an excellent response to fenofibrate in addition to her other therapies.  Her triglycerides and lipid profile have now normalized.  LDL is 83 which is slightly higher than target but overall much improved.  We will continue with her current therapies.  I advised continued exercise and dietary modifications.  Plan follow-up with me in 6 months or sooner as necessary.  Pixie Casino, MD, [facility], [place] Director of the Advanced Lipid Disorders &  Cardiovascular Risk Reduction Clinic Diplomate of the American Board of Clinical Lipidology Attending Cardiologist  Direct Dial: [phone]  Fax: [phone]  Website:  [email or web address] 03/04/2022, 9:39 AM

## 2022-03-07 ENCOUNTER — Encounter: Payer: Self-pay | Admitting: *Deleted

## 2022-03-18 ENCOUNTER — Ambulatory Visit: Payer: Medicare Other | Admitting: Pharmacist

## 2022-03-18 DIAGNOSIS — E1165 Type 2 diabetes mellitus with hyperglycemia: Secondary | ICD-10-CM

## 2022-03-18 DIAGNOSIS — M8580 Other specified disorders of bone density and structure, unspecified site: Secondary | ICD-10-CM

## 2022-03-18 DIAGNOSIS — E1140 Type 2 diabetes mellitus with diabetic neuropathy, unspecified: Secondary | ICD-10-CM

## 2022-03-18 DIAGNOSIS — J45901 Unspecified asthma with (acute) exacerbation: Secondary | ICD-10-CM

## 2022-03-18 MED ORDER — METFORMIN HCL ER 500 MG PO TB24: ORAL_TABLET | ORAL | 3 refills | Status: DC
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
## 2022-04-13 ENCOUNTER — Other Ambulatory Visit: Payer: Self-pay | Admitting: Family Medicine

## 2022-04-25 DIAGNOSIS — M5416 Radiculopathy, lumbar region: Secondary | ICD-10-CM | POA: Diagnosis not present

## 2022-04-25 DIAGNOSIS — M48062 Spinal stenosis, lumbar region with neurogenic claudication: Secondary | ICD-10-CM | POA: Diagnosis not present

## 2022-04-26 DIAGNOSIS — Z85828 Personal history of other malignant neoplasm of skin: Secondary | ICD-10-CM | POA: Diagnosis not present

## 2022-04-26 DIAGNOSIS — L565 Disseminated superficial actinic porokeratosis (DSAP): Secondary | ICD-10-CM | POA: Diagnosis not present

## 2022-04-26 DIAGNOSIS — D2271 Melanocytic nevi of right lower limb, including hip: Secondary | ICD-10-CM | POA: Diagnosis not present

## 2022-04-26 DIAGNOSIS — L821 Other seborrheic keratosis: Secondary | ICD-10-CM | POA: Diagnosis not present

## 2022-04-26 DIAGNOSIS — D2272 Melanocytic nevi of left lower limb, including hip: Secondary | ICD-10-CM | POA: Diagnosis not present

## 2022-04-26 DIAGNOSIS — D225 Melanocytic nevi of trunk: Secondary | ICD-10-CM | POA: Diagnosis not present

## 2022-04-29 DIAGNOSIS — M5416 Radiculopathy, lumbar region: Secondary | ICD-10-CM | POA: Diagnosis not present

## 2022-05-09 ENCOUNTER — Other Ambulatory Visit: Payer: Self-pay | Admitting: Family Medicine

## 2022-05-09 DIAGNOSIS — D509 Iron deficiency anemia, unspecified: Secondary | ICD-10-CM

## 2022-05-25 NOTE — Progress Notes (Signed)
HPI female never smoker followed for allergic rhinitis, asthma, complicated by history embolic CVA complicating C-spine surgery, DM 2  ---------------------------------------------------------------------   11/23/21- 81 year old female never smoker followed for allergic Rhinitis, Asthma, complicated by history embolic CVA complicating C-spine surgery, DM 2, hx DVT/PE 2006, HTN,  Follows for: asthma, allergic rhinitis  -Ventolin hfa, Dymista, Advair 250,  Covid vax- [place] [facility] 4/23- Asthma exacerb > prednisone Doing much better after April flare asthma with ED visit. She thinks that was viral triggered, now resolved. Advair and albuterol ok. Uses rescue only occasionally.  No significant rhinitis now. CXR 09/16/21- IMPRESSION: No active disease.  05/26/22- 81 year old female never smoker followed for allergic Rhinitis, Asthma, complicated by history embolic CVA complicating C-spine surgery, DM 2, hx DVT/PE 2006, HTN, Covid infection 01/15/22,  Follows for: asthma, allergic rhinitis  -Ventolin hfa, Dymista, Advair 250,  Covid vax- 4 Moderna Flu vax-had Covid infection 8/19> Paxlovid renal dosing ACT score-24    Pt is doing well She thinks Paxlovid helped her Covid infection and she is back to baseline. Prednisone for asthma then made her "hyper". She got the neb medicine but has not yet gotten the compressor. We will notify [employer]. CXR 01/15/22- IMPRESSION: No active cardiopulmonary disease.   ROV-see HPI + = positive Constitutional:   No-   weight loss, night sweats, fevers, chills, fatigue, lassitude. HEENT:   No-  headaches, difficulty swallowing, tooth/dental problems, sore throat,       No- sneezing, itching, ear ache, +nasal congestion, post nasal drip,  CV:  chest pain, no-orthopnea, PND, swelling in lower extremities, anasarca, dizziness, palpitations Resp: No-   shortness of breath with exertion or at rest.              No-   productive cough,  No non-productive cough,  No-  coughing up of blood.              No-   change in color of mucus.  + Wheezing.   Skin: No-   rash or lesions. GI:  GU:  MS:  No-   joint pain or swelling.  No- decreased range of motion.  Back pain after surgery Neuro-     nothing unusual Psych:  No- change in mood or affect. No depression or anxiety.  No memory loss.  OBJ General- Alert, Oriented, Affect-appropriate, Distress- none acute, looks very well Skin- rash-none, lesions- none, excoriation- none Lymphadenopathy- none Head- atraumatic            Eyes- Gross vision intact, PERRLA, conjunctivae clear secretions            Ears- Hearing, canals-normal            Nose- Clear, no-Septal dev, mucus, polyps, erosion, perforation             Throat- Mallampati II , mucosa clear , drainage- none, tonsils- atrophic, +mild hoarseness Neck- flexible , trachea midline, no stridor , thyroid nl, carotid no bruit Chest - symmetrical excursion , unlabored           Heart/CV- RRR , no murmur , no gallop  , no rub, nl s1 s2                           - JVD- none , edema- none, stasis changes- none, varices- none           Lung- Clear,  Wheeze- none, cough-none , dullness-none, rub- none  Chest wall-  Abd-  Br/ Gen/ Rectal- Not done, not indicated Extrem- cyanosis- none, clubbing, none, atrophy- none, strength- nl, + cane Neuro- grossly intact to observation

## 2022-05-26 ENCOUNTER — Ambulatory Visit: Payer: Medicare Other | Admitting: Internal Medicine

## 2022-05-26 ENCOUNTER — Encounter: Payer: Self-pay | Admitting: Internal Medicine

## 2022-05-26 VITALS — BP 122/62 | HR 61 | Ht 63.5 in | Wt 157.2 lb

## 2022-05-26 DIAGNOSIS — J4520 Mild intermittent asthma, uncomplicated: Secondary | ICD-10-CM

## 2022-05-26 DIAGNOSIS — J302 Other seasonal allergic rhinitis: Secondary | ICD-10-CM

## 2022-05-26 DIAGNOSIS — J3089 Other allergic rhinitis: Secondary | ICD-10-CM

## 2022-05-26 NOTE — Patient Instructions (Signed)
I'm so glad you are doing well.  Please let u know when you need your meds refilled.

## 2022-05-31 ENCOUNTER — Telehealth: Payer: Self-pay | Admitting: Internal Medicine

## 2022-05-31 MED ORDER — FLUTICASONE-SALMETEROL 250-50 MCG/ACT IN AEPB: INHALATION_SPRAY | RESPIRATORY_TRACT | 3 refills | Status: DC

## 2022-05-31 NOTE — Telephone Encounter (Signed)
States her regular Pharm is out of ADZAIR 250/50. She would like [place] to call it in to the [facility] on [address]

## 2022-05-31 NOTE — Telephone Encounter (Signed)
Called and spoke to patient and she states that her current pharmacy is out of Advair and she is needing refills sent into [facility] on [address]. Verified pharmacy with her and medication. Nothing further needed

## 2022-06-01 ENCOUNTER — Other Ambulatory Visit (HOSPITAL_BASED_OUTPATIENT_CLINIC_OR_DEPARTMENT_OTHER): Payer: Self-pay

## 2022-06-01 ENCOUNTER — Telehealth: Payer: Self-pay | Admitting: Internal Medicine

## 2022-06-01 MED ORDER — VASCEPA 1 G PO CAPS: ORAL_CAPSULE | ORAL | 3 refills | Status: DC

## 2022-06-01 MED ORDER — COMIRNATY 30 MCG/0.3ML IM SUSY
PREFILLED_SYRINGE | INTRAMUSCULAR | 0 refills | Status: DC
  Filled 2022-06-01: qty 0.3, 1d supply, fill #0

## 2022-06-01 NOTE — Telephone Encounter (Signed)
Spoke to patient she stated her insurance will only approve brand name Vascepa.Advised I will send refill to her pharmacy.

## 2022-06-01 NOTE — Telephone Encounter (Signed)
Pt c/o medication issue:  1. Name of Medication:  icosapent Ethyl   2. How are you currently taking this medication (dosage and times per day)?   3. Are you having a reaction (difficulty breathing--STAT)?   4. What is your medication issue?   Patient states hr insurance informed her that they will cover Vascepa, but they will not cover Icosapent. She would like to know if an order can be sent in for Vascepa only.

## 2022-06-03 ENCOUNTER — Other Ambulatory Visit (HOSPITAL_BASED_OUTPATIENT_CLINIC_OR_DEPARTMENT_OTHER): Payer: Self-pay

## 2022-06-06 ENCOUNTER — Other Ambulatory Visit: Payer: Self-pay | Admitting: *Deleted

## 2022-06-06 DIAGNOSIS — E1169 Type 2 diabetes mellitus with other specified complication: Secondary | ICD-10-CM

## 2022-06-14 ENCOUNTER — Encounter: Payer: Self-pay | Admitting: *Deleted

## 2022-06-14 NOTE — Telephone Encounter (Signed)
Received a letter from [facility] that medication is not on formulary. Prior authorization is required. Sent to [employer] [facility] for assistance

## 2022-06-15 NOTE — Telephone Encounter (Signed)
**Note De-Identified  Obfuscation** [name] [name]  PA Case ID: [id number] Outcome This medication or product is on your plan's list of covered drugs. Prior authorization is not required at this time. If your pharmacy has questions regarding the processing of your prescription, please have them call the [facility] pharmacy help desk at ([phone]. **Please note: This request was submitted electronically. Formulary lowering, tiering exception, cost reduction and/or pre-benefit determination review (including prospective Medicare hospice reviews) requests cannot be requested using this method of submission. Providers contact us at [phone] for further assistance. Drug Vascepa 1GM capsules Form [facility] Medicare Part D Electronic Prior Authorization Form (2017 NCPDP)  Per [facility] a PA is not needed for the pts Vascepa. They state that the pt requested a refill too soon/Cannot be filled again until 06/23/2022.

## 2022-06-16 ENCOUNTER — Ambulatory Visit (INDEPENDENT_AMBULATORY_CARE_PROVIDER_SITE_OTHER): Payer: Medicare Other | Admitting: Family Medicine

## 2022-06-16 ENCOUNTER — Encounter: Payer: Self-pay | Admitting: Family Medicine

## 2022-06-16 VITALS — BP 112/60 | HR 58 | Temp 98.1°F | Resp 18 | Ht 63.5 in | Wt 155.4 lb

## 2022-06-16 DIAGNOSIS — I10 Essential (primary) hypertension: Secondary | ICD-10-CM

## 2022-06-16 DIAGNOSIS — G8929 Other chronic pain: Secondary | ICD-10-CM | POA: Diagnosis not present

## 2022-06-16 DIAGNOSIS — Z79899 Other long term (current) drug therapy: Secondary | ICD-10-CM | POA: Diagnosis not present

## 2022-06-16 DIAGNOSIS — J45901 Unspecified asthma with (acute) exacerbation: Secondary | ICD-10-CM

## 2022-06-16 DIAGNOSIS — M792 Neuralgia and neuritis, unspecified: Secondary | ICD-10-CM

## 2022-06-16 DIAGNOSIS — M545 Low back pain, unspecified: Secondary | ICD-10-CM

## 2022-06-16 DIAGNOSIS — E782 Mixed hyperlipidemia: Secondary | ICD-10-CM | POA: Diagnosis not present

## 2022-06-16 DIAGNOSIS — E1165 Type 2 diabetes mellitus with hyperglycemia: Secondary | ICD-10-CM

## 2022-06-16 LAB — COMPREHENSIVE METABOLIC PANEL
ALT: 23 U/L (ref 0–35)
AST: 22 U/L (ref 0–37)
Albumin: 4.4 g/dL (ref 3.5–5.2)
Alkaline Phosphatase: 36 U/L — ABNORMAL LOW (ref 39–117)
BUN: 22 mg/dL (ref 6–23)
CO2: 29 mEq/L (ref 19–32)
Calcium: 9.8 mg/dL (ref 8.4–10.5)
Chloride: 104 mEq/L (ref 96–112)
Creatinine, Ser: 1.13 mg/dL (ref 0.40–1.20)
GFR: 45.67 mL/min — ABNORMAL LOW (ref >60.00)
Glucose, Bld: 125 mg/dL — ABNORMAL HIGH (ref 70–99)
Potassium: 4.6 mEq/L (ref 3.5–5.1)
Sodium: 142 mEq/L (ref 135–145)
Total Bilirubin: 0.4 mg/dL (ref 0.2–1.2)
Total Protein: 6.9 g/dL (ref 6.0–8.3)

## 2022-06-16 LAB — CBC WITH DIFFERENTIAL/PLATELET
Basophils Absolute: 0 10*3/uL (ref 0.0–0.1)
Basophils Relative: 0.4 % (ref 0.0–3.0)
Eosinophils Absolute: 0.2 10*3/uL (ref 0.0–0.7)
Eosinophils Relative: 2.6 % (ref 0.0–5.0)
HCT: 35.4 % — ABNORMAL LOW (ref 36.0–46.0)
Hemoglobin: 12.2 g/dL (ref 12.0–15.0)
Lymphocytes Relative: 32 % (ref 12.0–46.0)
Lymphs Abs: 2.5 10*3/uL (ref 0.7–4.0)
MCHC: 34.6 g/dL (ref 30.0–36.0)
MCV: 89.9 fl (ref 78.0–100.0)
Monocytes Absolute: 0.7 10*3/uL (ref 0.1–1.0)
Monocytes Relative: 8.3 % (ref 3.0–12.0)
Neutro Abs: 4.5 10*3/uL (ref 1.4–7.7)
Neutrophils Relative %: 56.7 % (ref 43.0–77.0)
Platelets: 260 10*3/uL (ref 150.0–400.0)
RBC: 3.94 Mil/uL (ref 3.87–5.11)
RDW: 15.4 % (ref 11.5–15.5)
WBC: 7.9 10*3/uL (ref 4.0–10.5)

## 2022-06-16 LAB — LIPID PANEL
Cholesterol: 158 mg/dL (ref 0–200)
HDL: 64.9 mg/dL (ref >39.00)
LDL Cholesterol: 72 mg/dL (ref 0–99)
NonHDL: 92.8
Total CHOL/HDL Ratio: 2
Triglycerides: 104 mg/dL (ref 0.0–149.0)
VLDL: 20.8 mg/dL (ref 0.0–40.0)

## 2022-06-16 LAB — HEMOGLOBIN A1C: Hgb A1c MFr Bld: 6.9 % — ABNORMAL HIGH (ref 4.6–6.5)

## 2022-06-16 LAB — MICROALBUMIN / CREATININE URINE RATIO
Creatinine,U: 71.3 mg/dL
Microalb Creat Ratio: 1 mg/g (ref 0.0–30.0)
Microalb, Ur: <0.7 mg/dL (ref 0.0–1.9)

## 2022-06-16 MED ORDER — CYCLOBENZAPRINE HCL 10 MG PO TABS: ORAL_TABLET | ORAL | 2 refills | Status: DC

## 2022-06-16 MED ORDER — CELECOXIB 200 MG PO CAPS: ORAL_CAPSULE | ORAL | 3 refills | Status: DC

## 2022-06-16 NOTE — Progress Notes (Signed)
Subjective:   By signing my name below, I, Shehryar Baig, attest that this documentation has been prepared under the direction and in the presence of [name], DO. 06/16/2022   Patient ID: [name], female    DOB: 07-29-1940, 82 y.o.   MRN: [id number]  Chief Complaint  Patient presents with   Annual Exam    Pt states fasting     HPI Patient is in today for a comprehensive physical exam.   She is requesting a refill for 200 mg celebrex and 10 mg flexeril.  Her blood sugars are stable at home. She continues taking 500 mg metformin daily Po and reports no new issues while taking it.  Lab Results  Component Value Date   HGBA1C 6.9 (H) 06/16/2022   She continues following up with her cardiologist regularly. She also continues following up with her pulmonologist regularly.  She denies having any fever, new moles, congestion, sore throat, sinus pain, new muscle pain, new joint pain, chest pain, palpations, cough, SOB, wheezing, n/v/d, constipation, dysuria, frequency, hematuria, or headaches at this time.  She has 5 Covid-19 vaccines at this time. Her bone density exam was last completed 02/28/2022 and results showed she is osteopenic. Repeat in 2 years.  Her mammogram was last completed 07/19/2021. Results were normal. Repeat in 1 year.    Past Medical History:  Diagnosis Date   Anemia    Arthritis    Asthma    adult onset   Basal cell cancer    LUE; Porokeratosis also   Chronic kidney disease 1963   strep in kidney due to strep throat-hospitalized 10 days   Complication of anesthesia    small trachea   Diabetes mellitus 2010   A1c 6.7%   DVT (deep venous thrombosis) ([place]) 2006   post immobilization post cns surgery   GERD (gastroesophageal reflux disease)    very mild   Granulomatous lung disease ([place]) 2002   incidental Xray finding   Hyperlipidemia    Hypertension 2004   Hypertensive response on Stress Test   Paralysis ([place]) 2006   post cervical fusion  with spinal sac tear  with hematoma    PTE (pulmonary thromboembolism) ([place]) 2006    Past Surgical History:  Procedure Laterality Date   ABDOMINAL EXPOSURE N/A 08/12/2016   Procedure: ABDOMINAL EXPOSURE;  Surgeon: Rosetta Posner, MD;  Location: [place];  Service: Vascular;  Laterality: N/A;   ANTERIOR LAT LUMBAR FUSION N/A 08/12/2016   Procedure: LUMBAR TWO-THREE, LUMBAR THREE-FOUR, LUMBAR FOUR-FIVE  ANTEROLATERAL LUMBAR INTERBODY FUSION;  Surgeon: Erline Levine, MD;  Location: [place];  Service: Neurosurgery;  Laterality: N/A;  L2-3 L3-4 L4-5 Anterolateral lumbar interbody fusion   ANTERIOR LUMBAR FUSION N/A 08/12/2016   Procedure: Lumbar Five-Sacral One Anterior lumbar interbody fusion with Dr. Sherren Mocha Early to assist;  Surgeon: Erline Levine, MD;  Location: [place];  Service: Neurosurgery;  Laterality: N/A;  L5-S1 Anterior lumbar interbody fusion with Dr. Sherren Mocha Early to assist   BUNIONECTOMY     CATARACT EXTRACTION Right 12/31/2018   CATARACT EXTRACTION Left 12/17/2018   CERVICAL FUSION  2006   Dr Annette Stable, NS;post op hematoma & cns leak & urinary retention   COLONOSCOPY  1992 & 2002   negative   epidural steroids  2006   cervical spine   LUMBAR PERCUTANEOUS PEDICLE SCREW 4 LEVEL N/A 08/12/2016   Procedure: LUMBAR TWO-SACRAL ONE Percuataneous Pedicle Screws;  Surgeon: Erline Levine, MD;  Location: [place];  Service: Neurosurgery;  Laterality: N/A;   ROTATOR CUFF REPAIR  2009   Right   SEPTOPLASTY     TONSILLECTOMY  82 years old   TOTAL HIP ARTHROPLASTY  06/01/2012   Procedure: TOTAL HIP ARTHROPLASTY ANTERIOR APPROACH;  Surgeon: Mcarthur Rossetti, MD;  Location: [facility];  Service: Orthopedics;  Laterality: Left;  Left Total Hip Arthroplasty   TOTAL HIP ARTHROPLASTY Right 02/23/2018   Procedure: RIGHT TOTAL HIP ARTHROPLASTY ANTERIOR APPROACH;  Surgeon: Mcarthur Rossetti, MD;  Location: [facility];  Service: Orthopedics;  Laterality: Right;   TUBAL LIGATION      Family History  Problem Relation Age of  Onset   COPD Father        emphysema   Cancer Mother        cns cancer   Diabetes Sister        TWIN sister ; also Fibromyalgia ; S/P stent 2004   Heart disease Sister        stents @ 4 & 39   Stroke Maternal Grandmother        in  late 85s   Transient ischemic attack Paternal Aunt     Social History   Socioeconomic History   Marital status: Divorced    Spouse name: Not on file   Number of children: Not on file   Years of education: Not on file   Highest education level: Not on file  Occupational History   Not on file  Tobacco Use   Smoking status: Never    Passive exposure: Past   Smokeless tobacco: Never  Vaping Use   Vaping Use: Never used  Substance and Sexual Activity   Alcohol use: Yes    Alcohol/week: 1.0 standard drink of alcohol    Types: 1 Glasses of wine per week    Comment: socially   Drug use: No   Sexual activity: Never  Other Topics Concern   Not on file  Social History Narrative   Not on file   Social Determinants of Health   Financial Resource Strain: Low Risk  (10/29/2021)   Overall Financial Resource Strain (CARDIA)    Difficulty of Paying Living Expenses: Not hard at all  Food Insecurity: No Food Insecurity (07/09/2021)   Hunger Vital Sign    Worried About Running Out of Food in the Last Year: Never true    Ran Out of Food in the Last Year: Never true  Transportation Needs: No Transportation Needs (06/14/2021)   PRAPARE - Hydrologist (Medical): No    Lack of Transportation (Non-Medical): No  Physical Activity: Insufficiently Active (10/29/2021)   Exercise Vital Sign    Days of Exercise per Week: 4 days    Minutes of Exercise per Session: 30 min  Stress: No Stress Concern Present (07/09/2021)   [place]    Feeling of Stress : Not at all  Social Connections: Moderately Isolated (10/29/2021)   Social Connection and Isolation Panel [NHANES]     Frequency of Communication with Friends and Family: Three times a week    Frequency of Social Gatherings with Friends and Family: More than three times a week    Attends Religious Services: Never    Marine scientist or Organizations: Yes    Attends Music therapist: More than 4 times per year    Marital Status: Divorced  Intimate Partner Violence: Not on file    Outpatient Medications Prior to Visit  Medication Sig Dispense Refill   Accu-Chek Softclix Lancets lancets USE TO TEST BLOOD SUGAR TWICE DAILY AS DIRECTED 100 each 12   albuterol (PROVENTIL) (2.5 MG/3ML) 0.083% nebulizer solution Take 3 mLs (2.5 mg total) by nebulization every 6 (six) hours as needed for wheezing or shortness of breath. 75 mL 12   albuterol (VENTOLIN HFA) 108 (90 Base) MCG/ACT inhaler USE 2 INHALATIONS BY MOUTH  EVERY 4 TO 6 HOURS AS  NEEDED 51 g 3   alendronate (FOSAMAX) 70 MG tablet TAKE 1 TABLET(70 MG) BY MOUTH 1 TIME A WEEK 12 tablet 3   aspirin EC 81 MG tablet Take 81 mg by mouth daily.     azelastine (ASTELIN) 0.1 % nasal spray Use 1-2 puffs in each nostril once or twice daily 30 mL 12   blood glucose meter kit and supplies KIT Dispense based on patient and insurance preference. Use up to four times daily as directed. 1 each 0   Calcium Citrate-Vitamin D (CALCIUM + D PO) Take 600 mg by mouth in the morning and at bedtime.     cetirizine (ZYRTEC) 10 MG tablet Take 10 mg by mouth daily as needed for allergies.     cholecalciferol (VITAMIN D3) 25 MCG (1000 UNIT) tablet Take 1,000 Units by mouth daily.     COVID-19 mRNA vaccine 2023-2024 (COMIRNATY) syringe Inject into the muscle. 0.3 mL 0   diclofenac Sodium (VOLTAREN) 1 % GEL Apply topically daily as needed (Pain).     ezetimibe (ZETIA) 10 MG tablet TAKE 1 TABLET(10 MG) BY MOUTH DAILY 90 tablet 1   fenofibrate 160 MG tablet Take 1 tablet (160 mg total) by mouth daily. 90 tablet 3   ferrous sulfate (FEROSUL) 325 (65 FE) MG tablet Take 1 tablet  (325 mg total) by mouth daily with breakfast. 90 tablet 1   fluticasone-salmeterol (ADVAIR DISKUS) 250-50 MCG/ACT AEPB USE 1 INHALATION BY MOUTH  TWICE DAILY RINSE MOUTH  AFTER USE 180 each 3   glucose blood (ACCU-CHEK GUIDE) test strip USE TO TEST BLOOD SUGAR UP TO TWICE DAILY AS DIRECTED 100 strip 12   Lidocaine 4 % PTCH Apply 1 patch topically as needed (back pain).     metFORMIN (GLUCOPHAGE-XR) 500 MG 24 hr tablet Take 1 tablet with morning meal, 1 tablet with lunch and 2 tablets with evening meal. 360 tablet 3   mirabegron ER (MYRBETRIQ) 50 MG TB24 tablet Take 1 tablet (50 mg total) by mouth daily. 90 tablet 3   Multiple Vitamin (MULTIVITAMIN WITH MINERALS) TABS tablet Take 1 tablet by mouth daily. Centrum One a day - 50+     Multiple Vitamins-Minerals (PRESERVISION AREDS 2+MULTI VIT PO) Take 1 tablet by mouth in the morning and at bedtime.     mupirocin ointment (BACTROBAN) 2 % Apply 1 application topically 3 (three) times daily. 30 g 3   NONFORMULARY OR COMPOUNDED ITEM Pt for cervical arthritis and low back pain 1 each 0   Polyethyl Glycol-Propyl Glycol (SYSTANE OP) Place 1 drop into both eyes daily as needed (dry eyes).     polyethylene glycol (MIRALAX / GLYCOLAX) packet Take 17 g by mouth daily as needed for mild constipation.      pregabalin (LYRICA) 100 MG capsule TAKE 1 CAPSULE BY MOUTH THREE TIMES DAILY 270 capsule 1   traMADol (ULTRAM) 50 MG tablet Take 1 tablet by mouth as needed.     Trolamine Salicylate (ASPERCREME EX) Apply 1 application topically daily as needed (back pain).  VASCEPA 1 g capsule Take 2 capsules ( 2 grams ) twice a day 360 capsule 3   celecoxib (CELEBREX) 200 MG capsule TAKE 1 CAPSULE(200 MG) BY MOUTH DAILY 90 capsule 3   cyclobenzaprine (FLEXERIL) 10 MG tablet TAKE 1 TABLET BY MOUTH THREE TIMES DAILY AS NEEDED FOR HIP CONCERNS 30 tablet 2   No facility-administered medications prior to visit.    Allergies  Allergen Reactions   Statins Other (See  Comments)    Myalgias and muscle weakness    Review of Systems  Constitutional:  Negative for fever and malaise/fatigue.  HENT:  Negative for congestion, sinus pain and sore throat.   Eyes:  Negative for blurred vision.  Respiratory:  Negative for cough, shortness of breath and wheezing.   Cardiovascular:  Negative for chest pain, palpitations and leg swelling.  Gastrointestinal:  Negative for abdominal pain, blood in stool, constipation, diarrhea, nausea and vomiting.  Genitourinary:  Negative for dysuria, frequency and hematuria.  Musculoskeletal:  Negative for falls.       (-)new muscle pain (-)new joint pain  Skin:  Negative for rash.       (-)New moles  Neurological:  Negative for dizziness, loss of consciousness and headaches.  Endo/Heme/Allergies:  Negative for environmental allergies.  Psychiatric/Behavioral:  Negative for depression. The patient is not nervous/anxious.        Objective:    Physical Exam Vitals and nursing note reviewed.  Constitutional:      General: She is not in acute distress.    Appearance: Normal appearance. She is well-developed. She is not ill-appearing.  HENT:     Head: Normocephalic and atraumatic.     Right Ear: Tympanic membrane, ear canal and external ear normal.     Left Ear: Tympanic membrane, ear canal and external ear normal.  Eyes:     Extraocular Movements: Extraocular movements intact.     Conjunctiva/sclera: Conjunctivae normal.     Pupils: Pupils are equal, round, and reactive to light.  Neck:     Thyroid: No thyromegaly.     Vascular: No carotid bruit or JVD.  Cardiovascular:     Rate and Rhythm: Normal rate and regular rhythm.     Pulses:          Dorsalis pedis pulses are 2+ on the right side and 2+ on the left side.       Posterior tibial pulses are 2+ on the right side and 2+ on the left side.     Heart sounds: Normal heart sounds. No murmur heard.    No gallop.  Pulmonary:     Effort: Pulmonary effort is normal. No  respiratory distress.     Breath sounds: Normal breath sounds. No wheezing or rales.  Chest:     Chest wall: No tenderness.  Abdominal:     General: Bowel sounds are normal. There is no distension.     Palpations: Abdomen is soft.     Tenderness: There is no abdominal tenderness. There is no guarding.  Musculoskeletal:     Cervical back: Normal range of motion and neck supple.  Skin:    General: Skin is warm and dry.  Neurological:     Mental Status: She is alert and oriented to person, place, and time.  Psychiatric:        Judgment: Judgment normal.     BP 112/60 (BP Location: Left Arm, Patient Position: Sitting, Cuff Size: Normal)   Pulse (!) 58   Temp 98.1 F (36.7 C) (  Oral)   Resp 18   Ht 5' 3.5" (1.613 m)   Wt 155 lb 6.4 oz (70.5 kg)   SpO2 99%   BMI 27.10 kg/m  Wt Readings from Last 3 Encounters:  06/16/22 155 lb 6.4 oz (70.5 kg)  05/26/22 157 lb 3.2 oz (71.3 kg)  03/04/22 157 lb (71.2 kg)       Assessment & Plan:  High risk medication use -     Drug Monitoring Panel [phone] , Urine  Neuropathic pain -     Drug Monitoring Panel [phone] , Urine  Chronic left-sided low back pain without sciatica -     Celecoxib; TAKE 1 CAPSULE(200 MG) BY MOUTH DAILY  Dispense: 90 capsule; Refill: 3 -     Cyclobenzaprine HCl; TAKE 1 TABLET BY MOUTH THREE TIMES DAILY AS NEEDED FOR HIP CONCERNS  Dispense: 30 tablet; Refill: 2  Uncontrolled type 2 diabetes mellitus with hyperglycemia (HCC) -     Lipid panel -     CBC with Differential/Platelet -     Comprehensive metabolic panel -     Hemoglobin A1c -     Microalbumin / creatinine urine ratio  Mixed hyperlipidemia Assessment & Plan: Encourage heart healthy diet such as MIND or DASH diet, increase exercise, avoid trans fats, simple carbohydrates and processed foods, consider a krill or fish or flaxseed oil cap daily.    Orders: -     Lipid panel -     CBC with Differential/Platelet -     Comprehensive metabolic panel -      Hemoglobin A1c -     Microalbumin / creatinine urine ratio  Essential hypertension Assessment & Plan: Well controlled, no changes to meds. Encouraged heart healthy diet such as the DASH diet and exercise as tolerated.    Orders: -     Lipid panel -     CBC with Differential/Platelet -     Comprehensive metabolic panel -     Hemoglobin A1c -     Microalbumin / creatinine urine ratio  Moderate asthma with exacerbation, unspecified whether persistent Assessment & Plan: Per pulmonary     I, [name], DO, personally preformed the services described in this documentation.  All medical record entries made by the scribe were at my direction and in my presence.  I have reviewed the chart and discharge instructions (if applicable) and agree that the record reflects my personal performance and is accurate and complete. 06/16/2022   I,Shehryar Baig,acting as a scribe for [name], DO.,have documented all relevant documentation on the behalf of [name], DO,as directed by  [name], DO while in the presence of [name], DO.   [name], DO

## 2022-06-16 NOTE — Assessment & Plan Note (Signed)
Well controlled, no changes to meds. Encouraged heart healthy diet such as the DASH diet and exercise as tolerated.  °

## 2022-06-16 NOTE — Patient Instructions (Signed)

## 2022-06-16 NOTE — Assessment & Plan Note (Signed)
Per pulmonary 

## 2022-06-16 NOTE — Assessment & Plan Note (Signed)
Encourage heart healthy diet such as MIND or DASH diet, increase exercise, avoid trans fats, simple carbohydrates and processed foods, consider a krill or fish or flaxseed oil cap daily.  °

## 2022-06-17 DIAGNOSIS — M5416 Radiculopathy, lumbar region: Secondary | ICD-10-CM | POA: Diagnosis not present

## 2022-06-17 DIAGNOSIS — M48062 Spinal stenosis, lumbar region with neurogenic claudication: Secondary | ICD-10-CM | POA: Diagnosis not present

## 2022-06-17 DIAGNOSIS — M542 Cervicalgia: Secondary | ICD-10-CM | POA: Diagnosis not present

## 2022-06-18 LAB — DRUG MONITORING PANEL 376104, URINE
Amphetamines: NEGATIVE ng/mL (ref <500)
Barbiturates: NEGATIVE ng/mL (ref <300)
Benzodiazepines: NEGATIVE ng/mL (ref <100)
Cocaine Metabolite: NEGATIVE ng/mL (ref <150)
Desmethyltramadol: 680 ng/mL — ABNORMAL HIGH (ref <100)
Opiates: NEGATIVE ng/mL (ref <100)
Oxycodone: NEGATIVE ng/mL (ref <100)
Tramadol: 5927 ng/mL — ABNORMAL HIGH (ref <100)

## 2022-06-18 LAB — DM TEMPLATE

## 2022-06-20 ENCOUNTER — Ambulatory Visit (INDEPENDENT_AMBULATORY_CARE_PROVIDER_SITE_OTHER): Payer: Medicare Other | Admitting: Orthopaedic Surgery

## 2022-06-20 ENCOUNTER — Ambulatory Visit (INDEPENDENT_AMBULATORY_CARE_PROVIDER_SITE_OTHER): Payer: Medicare Other

## 2022-06-20 DIAGNOSIS — M25552 Pain in left hip: Secondary | ICD-10-CM

## 2022-06-20 NOTE — Progress Notes (Signed)
Although the patient is listed as new patient, we have replaced both of her hips.  Is been a while since we have seen her.  We replaced her left hip in 2014 and her right hip in 2019.  She has had extensive lumbar spine surgery.  Recently she had an epidural steroid injection to the left side in her back and she said this did help with some hip pain but she has been having some right hip pain and discomfort.  She walks without assistive device.  She does have pain with the right hip flexion and some with rotation with no blocks to rotation.  There is only some mild pain over the trochanteric area of the hip.  An AP pelvis and lateral both hips shows well-seated total hip arthroplasties with no complicating features.  There is no evidence of loosening.  She said the x-rays do give her reassurance.  I showed her some stretching exercises to try.  If things worsen she will let us know.  Follow-up is as needed.  All questions and concerns were addressed and answered.

## 2022-06-21 NOTE — Assessment & Plan Note (Signed)
Back to baseline uncomplicated with routine meds. Plan- Notify [employer] she has not gotten nebulizer compressor.

## 2022-06-21 NOTE — Assessment & Plan Note (Signed)
Allergy vaccine years ago. Now seasonal and currently doing well.

## 2022-06-30 ENCOUNTER — Other Ambulatory Visit: Payer: Self-pay | Admitting: Family Medicine

## 2022-06-30 DIAGNOSIS — E1169 Type 2 diabetes mellitus with other specified complication: Secondary | ICD-10-CM

## 2022-07-01 ENCOUNTER — Ambulatory Visit (INDEPENDENT_AMBULATORY_CARE_PROVIDER_SITE_OTHER): Payer: Medicare Other | Admitting: Pharmacist

## 2022-07-01 DIAGNOSIS — E1140 Type 2 diabetes mellitus with diabetic neuropathy, unspecified: Secondary | ICD-10-CM

## 2022-07-01 DIAGNOSIS — J45901 Unspecified asthma with (acute) exacerbation: Secondary | ICD-10-CM

## 2022-07-01 DIAGNOSIS — E782 Mixed hyperlipidemia: Secondary | ICD-10-CM

## 2022-07-01 DIAGNOSIS — M8580 Other specified disorders of bone density and structure, unspecified site: Secondary | ICD-10-CM

## 2022-07-01 NOTE — Patient Instructions (Signed)
Mrs. Pounds It was a pleasure speaking with you today.  Below is a summary of our recent visit.    Diabetes - at goal of < 7.0% Continue metformin ER '500mg'$  - to take 1 tablet with morning meal, 1 tablet with lunch (if eating a large lunch) and 2 tablets evening meal.  Continue to check blood glucose daily  Cholesterol: LDL goal < 70; Tg goal < 150 - not at LDL goal but improved; Tg at goal Continue current medications for lowering cholesterol and triglycerides - ezetimibe, fenofibrate and Vascepa Continue to follow up with Dr Debara Pickett.  Osteopenia  Continue alendronate '70mg'$  weekly, calcium daily and vitamin D 1000 IU daily.   Asthma - Controlled.  Continue Wixela twice a day - looks like this will be lowest cost for you over the entire year but Advair is another option if you prefer and cost per month is the same $47 until coverage gap and then Advair is about $15 more per month.  Continue to use albuterol prior to exercise and as needed for shortness of breath / Wheezing.  Call [employer] to check to see if they can try to deliver nebulizer again.    As always if you have any questions or concerns especially regarding medications, please feel free to contact me either at the phone number below or with a MyChart message.   Keep up the good work!  Cherre Robins, PharmD Clinical Pharmacist [facility] [address] [facility] [phone] (direct line)  [phone] (main office number)
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
## 2022-07-10 ENCOUNTER — Other Ambulatory Visit: Payer: Self-pay | Admitting: Internal Medicine

## 2022-07-14 ENCOUNTER — Ambulatory Visit (INDEPENDENT_AMBULATORY_CARE_PROVIDER_SITE_OTHER): Payer: Medicare Other | Admitting: *Deleted

## 2022-07-14 DIAGNOSIS — Z0000 Encounter for general adult medical examination without abnormal findings: Secondary | ICD-10-CM

## 2022-07-14 NOTE — Progress Notes (Signed)
Subjective:   [name] is a 82 y.o. female who presents for Medicare Annual (Subsequent) preventive examination.  I connected with  Joesph Fillers on 07/14/22 by a audio enabled telemedicine application and verified that I am speaking with the correct person using two identifiers.  Patient Location: Home  Provider Location: Office/Clinic  I discussed the limitations of evaluation and management by telemedicine. The patient expressed understanding and agreed to proceed.   Review of Systems    Defer to PCP Cardiac Risk Factors include: advanced age (>87mn, >>39women);diabetes mellitus;dyslipidemia;hypertension     Objective:    There were no vitals filed for this visit. There is no height or weight on file to calculate BMI.     07/14/2022    3:06 PM 09/19/2021    2:08 PM 09/16/2021   11:14 AM 07/09/2021    1:47 PM 04/30/2021    8:05 PM 04/19/2019   11:14 AM 07/06/2018    5:25 PM  Advanced Directives  Does Patient Have a Medical Advance Directive? Yes No No Yes No Yes Yes  Type of [place]   [place] will  [place] will [place] will  Does patient want to make changes to medical advance directive? No - Patient declined     No - Patient declined No - Patient declined  Copy of [place]in Chart? Yes - validated most recent copy scanned in chart (See row information)   Yes - validated most recent copy scanned in chart (See row information)  Yes - validated most recent copy scanned in chart (See row information) No - copy requested  Would patient like information on creating a medical advance directive?   No - Patient declined  No - Patient declined  No - Patient declined    Current Medications (verified) Outpatient Encounter Medications as of 07/14/2022  Medication Sig   Accu-Chek Softclix Lancets lancets USE TO TEST BLOOD SUGAR TWICE  DAILY AS DIRECTED   albuterol (PROVENTIL) (2.5 MG/3ML) 0.083% nebulizer solution Take 3 mLs (2.5 mg total) by nebulization every 6 (six) hours as needed for wheezing or shortness of breath.   albuterol (VENTOLIN HFA) 108 (90 Base) MCG/ACT inhaler USE 2 INHALATIONS BY MOUTH  EVERY 4 TO 6 HOURS AS  NEEDED   alendronate (FOSAMAX) 70 MG tablet TAKE 1 TABLET(70 MG) BY MOUTH 1 TIME A WEEK   aspirin EC 81 MG tablet Take 81 mg by mouth daily.   azelastine (ASTELIN) 0.1 % nasal spray Use 1-2 puffs in each nostril once or twice daily   blood glucose meter kit and supplies KIT Dispense based on patient and insurance preference. Use up to four times daily as directed.   Calcium Citrate-Vitamin D (CALCIUM + D PO) Take 600 mg by mouth in the morning and at bedtime.   celecoxib (CELEBREX) 200 MG capsule TAKE 1 CAPSULE(200 MG) BY MOUTH DAILY   cetirizine (ZYRTEC) 10 MG tablet Take 10 mg by mouth daily as needed for allergies.   cholecalciferol (VITAMIN D3) 25 MCG (1000 UNIT) tablet Take 1,000 Units by mouth daily.   cyclobenzaprine (FLEXERIL) 10 MG tablet TAKE 1 TABLET BY MOUTH THREE TIMES DAILY AS NEEDED FOR HIP CONCERNS   diclofenac Sodium (VOLTAREN) 1 % GEL Apply topically daily as needed (Pain).   ezetimibe (ZETIA) 10 MG tablet TAKE 1 TABLET(10 MG) BY MOUTH DAILY   fenofibrate 160 MG tablet TAKE 1 TABLET(160  MG) BY MOUTH DAILY   ferrous sulfate (FEROSUL) 325 (65 FE) MG tablet Take 1 tablet (325 mg total) by mouth daily with breakfast.   fluticasone-salmeterol (ADVAIR DISKUS) 250-50 MCG/ACT AEPB USE 1 INHALATION BY MOUTH  TWICE DAILY RINSE MOUTH  AFTER USE   glucose blood (ACCU-CHEK GUIDE) test strip USE TO TEST BLOOD SUGAR UP TO TWICE DAILY AS DIRECTED   Lidocaine 4 % PTCH Apply 1 patch topically as needed (back pain).   metFORMIN (GLUCOPHAGE-XR) 500 MG 24 hr tablet Take 1 tablet with morning meal, 1 tablet with lunch and 2 tablets with evening meal.   mirabegron ER (MYRBETRIQ) 50 MG TB24 tablet Take 1  tablet (50 mg total) by mouth daily.   Multiple Vitamin (MULTIVITAMIN WITH MINERALS) TABS tablet Take 1 tablet by mouth daily. Centrum One a day - 50+   Multiple Vitamins-Minerals (PRESERVISION AREDS 2+MULTI VIT PO) Take 1 tablet by mouth in the morning and at bedtime.   mupirocin ointment (BACTROBAN) 2 % Apply 1 application topically 3 (three) times daily.   NONFORMULARY OR COMPOUNDED ITEM Pt for cervical arthritis and low back pain   Polyethyl Glycol-Propyl Glycol (SYSTANE OP) Place 1 drop into both eyes daily as needed (dry eyes).   polyethylene glycol (MIRALAX / GLYCOLAX) packet Take 17 g by mouth daily as needed for mild constipation.    pregabalin (LYRICA) 100 MG capsule TAKE 1 CAPSULE BY MOUTH THREE TIMES DAILY   traMADol (ULTRAM) 50 MG tablet Take 1 tablet by mouth as needed.   Trolamine Salicylate (ASPERCREME EX) Apply 1 application topically daily as needed (back pain).   VASCEPA 1 g capsule Take 2 capsules ( 2 grams ) twice a day   No facility-administered encounter medications on file as of 07/14/2022.    Allergies (verified) Statins   History: Past Medical History:  Diagnosis Date   Anemia    Arthritis    Asthma    adult onset   Basal cell cancer    LUE; Porokeratosis also   Chronic kidney disease 1963   strep in kidney due to strep throat-hospitalized 10 days   Complication of anesthesia    small trachea   Diabetes mellitus 2010   A1c 6.7%   DVT (deep venous thrombosis) ([place]) 2006   post immobilization post cns surgery   GERD (gastroesophageal reflux disease)    very mild   Granulomatous lung disease ([place]) 2002   incidental Xray finding   Hyperlipidemia    Hypertension 2004   Hypertensive response on Stress Test   Paralysis ([place]) 2006   post cervical fusion with spinal sac tear  with hematoma    PTE (pulmonary thromboembolism) ([place]) 2006   Past Surgical History:  Procedure Laterality Date   ABDOMINAL EXPOSURE N/A 08/12/2016   Procedure: ABDOMINAL EXPOSURE;   Surgeon: Rosetta Posner, MD;  Location: [place];  Service: Vascular;  Laterality: N/A;   ANTERIOR LAT LUMBAR FUSION N/A 08/12/2016   Procedure: LUMBAR TWO-THREE, LUMBAR THREE-FOUR, LUMBAR FOUR-FIVE  ANTEROLATERAL LUMBAR INTERBODY FUSION;  Surgeon: Erline Levine, MD;  Location: [place];  Service: Neurosurgery;  Laterality: N/A;  L2-3 L3-4 L4-5 Anterolateral lumbar interbody fusion   ANTERIOR LUMBAR FUSION N/A 08/12/2016   Procedure: Lumbar Five-Sacral One Anterior lumbar interbody fusion with Dr. Sherren Mocha Early to assist;  Surgeon: Erline Levine, MD;  Location: [place];  Service: Neurosurgery;  Laterality: N/A;  L5-S1 Anterior lumbar interbody fusion with Dr. Sherren Mocha Early to assist   BUNIONECTOMY     CATARACT EXTRACTION Right  12/31/2018   CATARACT EXTRACTION Left 12/17/2018   CERVICAL FUSION  2006   Dr Annette Stable, NS;post op hematoma & cns leak & urinary retention   COLONOSCOPY  1992 & 2002   negative   epidural steroids  2006   cervical spine   LUMBAR PERCUTANEOUS PEDICLE SCREW 4 LEVEL N/A 08/12/2016   Procedure: LUMBAR TWO-SACRAL ONE Percuataneous Pedicle Screws;  Surgeon: Erline Levine, MD;  Location: [place];  Service: Neurosurgery;  Laterality: N/A;   ROTATOR CUFF REPAIR  2009   Right   SEPTOPLASTY     TONSILLECTOMY  82 years old   TOTAL HIP ARTHROPLASTY  06/01/2012   Procedure: TOTAL HIP ARTHROPLASTY ANTERIOR APPROACH;  Surgeon: Mcarthur Rossetti, MD;  Location: [facility];  Service: Orthopedics;  Laterality: Left;  Left Total Hip Arthroplasty   TOTAL HIP ARTHROPLASTY Right 02/23/2018   Procedure: RIGHT TOTAL HIP ARTHROPLASTY ANTERIOR APPROACH;  Surgeon: Mcarthur Rossetti, MD;  Location: [facility];  Service: Orthopedics;  Laterality: Right;   TUBAL LIGATION     Family History  Problem Relation Age of Onset   COPD Father        emphysema   Cancer Mother        cns cancer   Diabetes Sister        TWIN sister ; also Fibromyalgia ; S/P stent 2004   Heart disease Sister        stents @ 40 & 87   Stroke  Maternal Grandmother        in  late 77s   Transient ischemic attack Paternal Aunt    Social History   Socioeconomic History   Marital status: Divorced    Spouse name: Not on file   Number of children: Not on file   Years of education: Not on file   Highest education level: Not on file  Occupational History   Not on file  Tobacco Use   Smoking status: Never    Passive exposure: Past   Smokeless tobacco: Never  Vaping Use   Vaping Use: Never used  Substance and Sexual Activity   Alcohol use: Yes    Alcohol/week: 1.0 standard drink of alcohol    Types: 1 Glasses of wine per week    Comment: socially   Drug use: No   Sexual activity: Never  Other Topics Concern   Not on file  Social History Narrative   Not on file   Social Determinants of Health   Financial Resource Strain: Low Risk  (10/29/2021)   Overall Financial Resource Strain (CARDIA)    Difficulty of Paying Living Expenses: Not hard at all  Food Insecurity: No Food Insecurity (07/14/2022)   Hunger Vital Sign    Worried About Running Out of Food in the Last Year: Never true    Ran Out of Food in the Last Year: Never true  Transportation Needs: No Transportation Needs (07/14/2022)   PRAPARE - Hydrologist (Medical): No    Lack of Transportation (Non-Medical): No  Physical Activity: Insufficiently Active (07/14/2022)   Exercise Vital Sign    Days of Exercise per Week: 3 days    Minutes of Exercise per Session: 40 min  Stress: No Stress Concern Present (07/09/2021)   [place]    Feeling of Stress : Not at all  Social Connections: Moderately Isolated (07/01/2022)   Social Connection and Isolation Panel [NHANES]    Frequency of Communication with Friends and  Family: More than three times a week    Frequency of Social Gatherings with Friends and Family: More than three times a week    Attends Religious Services: Never     Marine scientist or Organizations: Yes    Attends Music therapist: More than 4 times per year    Marital Status: Divorced    Tobacco Counseling Counseling given: Not Answered   Clinical Intake:  Pre-visit preparation completed: Yes  Pain : No/denies pain  Diabetes: Yes CBG done?: No Did pt. bring in CBG monitor from home?: No  How often do you need to have someone help you when you read instructions, pamphlets, or other written materials from your doctor or pharmacy?: 1 - Never   Activities of Daily Living    07/14/2022    3:14 PM  In your present state of health, do you have any difficulty performing the following activities:  Hearing? 0  Vision? 0  Difficulty concentrating or making decisions? 0  Walking or climbing stairs? 0  Dressing or bathing? 0  Doing errands, shopping? 0  Preparing Food and eating ? N  Using the Toilet? N  In the past six months, have you accidently leaked urine? Y  Comment occasionally  Do you have problems with loss of bowel control? N  Managing your Medications? N  Managing your Finances? N  Housekeeping or managing your Housekeeping? N    Patient Care Team: Carollee Herter, Alferd Apa, DO as PCP - General (Family Medicine) Deneise Lever, MD as Consulting Physician (Pulmonary Disease) Marlou Sa Tonna Corner, MD as Consulting Physician (Orthopedic Surgery) Mcarthur Rossetti, MD as Consulting Physician (Orthopedic Surgery) Danella Sensing, MD as Consulting Physician (Dermatology) Cherre Robins, Alicia (Pharmacist)  Indicate any recent Medical Services you may have received from other than [facility] providers in the past year (date may be approximate).     Assessment:   This is a routine wellness examination for [facility].  Hearing/Vision screen No results found.  Dietary issues and exercise activities discussed: Current Exercise Habits: Home exercise routine, Type of exercise: walking, Time (Minutes): 40, Frequency  (Times/Week): 3, Weekly Exercise (Minutes/Week): 120, Intensity: Moderate, Exercise limited by: orthopedic condition(s)   Goals Addressed   None    Depression Screen    07/14/2022    3:12 PM 06/16/2022    9:49 AM 09/09/2021    1:43 PM 07/09/2021    1:54 PM 08/11/2020   10:03 AM 04/19/2019   11:20 AM 12/11/2017    9:50 AM  PHQ 2/9 Scores  PHQ - 2 Score 0 0 0 0 0 0 0    Fall Risk    07/14/2022    3:07 PM 06/16/2022    9:48 AM 09/09/2021    1:43 PM 07/09/2021    1:50 PM 08/11/2020   10:03 AM  Fall Risk   Falls in the past year? 0 0 0 0 0  Number falls in past yr: 0 0 0 0 0  Injury with Fall? 0 0 0 0 0  Risk for fall due to : No Fall Risks      Follow up Falls evaluation completed Falls evaluation completed  Falls prevention discussed Falls evaluation completed    [place]:  Any stairs in or around the home? Yes  If so, are there any without handrails? No  Home free of loose throw rugs in walkways, pet beds, electrical cords, etc? Yes  Adequate lighting in your home to  reduce risk of falls? Yes   ASSISTIVE DEVICES UTILIZED TO PREVENT FALLS:  Life alert? No  Use of a cane, walker or w/c? No  Grab bars in the bathroom? Yes  Shower chair or bench in shower? Yes  Elevated toilet seat or a handicapped toilet?  Comfort height  TIMED UP AND GO:  Was the test performed?  No, audio visit .   Cognitive Function:    07/14/2022    3:39 PM 12/05/2016   10:30 AM  MMSE - Mini Mental State Exam  Not completed: Unable to complete   Orientation to time  5  Orientation to Place  5  Registration  3  Attention/ Calculation  4  Recall  3  Language- name 2 objects  2  Language- repeat  1  Language- follow 3 step command  3  Language- read & follow direction  1  Write a sentence  1  Copy design  1  Total score  29        Immunizations Immunization History  Administered Date(s) Administered   COVID-19, mRNA, vaccine(Comirnaty)12 years and older  06/01/2022   Fluad Quad(high Dose 65+) 01/28/2019, 03/31/2020, 03/15/2021, 03/01/2022   Influenza Inj Mdck Quad With Preservative 03/23/2018   Influenza Split 03/15/2012   Influenza Whole 02/27/2009   Influenza, High Dose Seasonal PF 03/19/2013, 03/21/2016   Influenza,inj,Quad PF,6+ Mos 03/13/2014, 02/16/2015   Influenza-Unspecified 03/15/2012, 01/28/2014, 03/13/2014, 02/16/2015, 02/14/2017   Moderna SARS-COV2 Booster Vaccination 04/14/2020, 12/04/2020   Moderna Sars-Covid-2 Vaccination 06/11/2019, 07/09/2019   Pneumococcal Conjugate-13 05/16/2013   Pneumococcal Polysaccharide-23 09/29/2014, 02/07/2020   Tdap 10/28/2010, 04/30/2021   Zoster Recombinat (Shingrix) 02/11/2019, 04/12/2019   Zoster, Live 05/12/2014    TDAP status: Up to date  Flu Vaccine status: Up to date  Pneumococcal vaccine status: Up to date  Covid-19 vaccine status: Information provided on how to obtain vaccines.   Qualifies for Shingles Vaccine? Yes   Zostavax completed Yes   Shingrix Completed?: Yes  Screening Tests Health Maintenance  Topic Date Due   Medicare Annual Wellness (AWV)  07/09/2022   MAMMOGRAM  07/19/2022   COVID-19 Vaccine (4 - 2023-24 season) 07/27/2022   HEMOGLOBIN A1C  12/15/2022   OPHTHALMOLOGY EXAM  01/15/2023   Diabetic kidney evaluation - eGFR measurement  06/17/2023   Diabetic kidney evaluation - Urine ACR  06/17/2023   FOOT EXAM  06/17/2023   DTaP/Tdap/Td (3 - Td or Tdap) 05/01/2031   Pneumonia Vaccine 51+ Years old  Completed   INFLUENZA VACCINE  Completed   DEXA SCAN  Completed   Zoster Vaccines- Shingrix  Completed   HPV VACCINES  Aged Out    Health Maintenance  Health Maintenance Due  Topic Date Due   Medicare Annual Wellness (AWV)  07/09/2022    Colorectal cancer screening: No longer required.   Mammogram status: Completed 07/19/21. Repeat every year  Bone Density status: Completed 02/28/22. Results reflect: Bone density results: OSTEOPENIA. Repeat every 2  years.  Lung Cancer Screening: (Low Dose CT Chest recommended if Age 67-80 years, 30 pack-year currently smoking OR have quit w/in 15years.) does not qualify.   Additional Screening:  Hepatitis C Screening: does not qualify  Vision Screening: Recommended annual ophthalmology exams for early detection of glaucoma and other disorders of the eye. Is the patient up to date with their annual eye exam?  Yes  Who is the provider or what is the name of the office in which the patient attends annual eye exams? Dr. Quincy Carnes If pt is  not established with a provider, would they like to be referred to a provider to establish care? No .   Dental Screening: Recommended annual dental exams for proper oral hygiene  Community Resource Referral / Chronic Care Management: CRR required this visit?  No   CCM required this visit?  No      Plan:     I have personally reviewed and noted the following in the patient's chart:   Medical and social history Use of alcohol, tobacco or illicit drugs  Current medications and supplements including opioid prescriptions. Patient is currently taking opioid prescriptions. Information provided to patient regarding non-opioid alternatives. Patient advised to discuss non-opioid treatment plan with their provider. Functional ability and status Nutritional status Physical activity Advanced directives List of other physicians Hospitalizations, surgeries, and ER visits in previous 12 months Vitals Screenings to include cognitive, depression, and falls Referrals and appointments  In addition, I have reviewed and discussed with patient certain preventive protocols, quality metrics, and best practice recommendations. A written personalized care plan for preventive services as well as general preventive health recommendations were provided to patient.   Due to this being a telephonic visit, the after visit summary with patients personalized plan was offered to patient  via mail or my-chart. Per request, patient was mailed a copy of AVS.   Beatris Ship, [place]   07/14/2022   Nurse Notes: None

## 2022-07-14 NOTE — Patient Instructions (Signed)
[name] , Thank you for taking time to come for your Medicare Wellness Visit. I appreciate your ongoing commitment to your health goals. Please review the following plan we discussed and let me know if I can assist you in the future.   These are the goals we discussed:  Goals      Chronic Care Management Pharmacy Care Plan     CARE PLAN ENTRY (see longitudinal plan of care for additional care plan information)  Current Barriers:  Chronic Disease Management support, education, and care coordination needs related to  Hypertension, Hyperlipidemia, Diabetes, Asthma, Osteopenia, Overactive Bladder, Neuropathy, Anemia  Hyperlipidemia, mixed Not at goal;  LDL goal < 100 and Tg goal <150 Lab Results  Component Value Date   CHOL 211 (H) 07/15/2021   CHOL 218 (H) 03/15/2021   CHOL 222 (H) 01/05/2021   Lab Results  Component Value Date   HDL 40 07/15/2021   HDL 42.70 03/15/2021   HDL 42 01/05/2021   Lab Results  Component Value Date   TRIG 454 (H) 07/15/2021   TRIG (H) 03/15/2021    511.0 Triglyceride is over 400; calculations on Lipids are invalid.   TRIG 337 (H) 01/05/2021   Lab Results  Component Value Date   LDLDIRECT 54 07/15/2021   LDLDIRECT 55.0 03/15/2021   LDLDIRECT 99 01/05/2021   Pharmacist Clinical Goal(s): Over the next 90 days, patient will work with PharmD and providers to maintain LDL goal < 100 Current regimen:  Ezetimibe 82m daily  Vascepa / icosapent ethyl 1 gram - take 2 capsules twice a day Fenofibrate 163mdaily  Interventions: Discussed LDL  and Tg goals  Encouraged increase in exercise as able to goal of 150 minute per week. Maintain cholesterol medication regimen.    Diabetes Lab Results  Component Value Date/Time   HGBA1C 7.3 (H) 09/09/2021 02:03 PM   HGBA1C 7.0 (H) 03/15/2021 02:18 PM  Pharmacist Clinical Goal(s): Over the next 90 days, patient will work with PharmD and providers to achieve A1c goal <7% Current regimen:  Metformin  50046m take 1 tablet twice a day with food Interventions: Encouraged increase in exercise as able to goal of 150 minute per week. Discussed diabetes goals Patient self care activities - Over the next 90 days, patient will: Maintain current diabetes regimen Encouraged increase in exercise as able to goal of 150 minute per week. Check blood glucose at home every other day Reviewed home blood glucose goals  Fasting blood glucose goal (before meals) = 80 to 130 Blood glucose goal after a meal = less than 180  Asthma: Controlled Current treatment: Advair 250/45m60miscus - inhale 1 puff into lungs twice a day Albuterol inhaler - inhaler 2 puffs every 6 hours as needed for shortness of breath or wheezing Interventions:  Educated on maintenance versus rescue inhaler Recommended rinse mouth / gargle after each use of Advair to prevent thrush Continue with plan to follow up with Dr YounAnnamaria BootsJune  Osteopenia:  Current treatment: Alendronate 70mg51mkly  Calcium 1200mg 110my Vitamin D 2000 IU daily  Interventions:   Continue current therapy Recommended recheck DEXA / bone density after October 2023.   Health Maintenance:  Patient's last COVID booster was 11/2020 - consider getting COVID bivalent booster inow Eye exam due around 12/2021 by Dr [facility] Interventions:  [facility] (since is getting steroid injection 11/02/2021)  Medication management Pharmacist Clinical Goal(s): Over the  next 90 days, patient will work with PharmD and providers to maintain optimal medication adherence Current pharmacy: [facility] Interventions Comprehensive medication review performed. Continue current medication management strategy Patient self care activities - Over the next 90 days, patient will: Focus on medication adherence by filling and taking medications appropriately  Take medications  as prescribed Report any questions or concerns to PharmD and/or provider(s)   Patient Goals/Self-Care Activities take medications as prescribed, check glucose every other day, document, and provide at future appointments, Increase exercise with a target a minimum of 150 minutes of moderate intensity exercise weekly as able Get bivalent COVID booster - depending on recommendation from orthopedist office.  Recheck DEXA (Bone density test)  after 02/2022      Please see past updates related to this goal by clicking on the "Past Updates" button in the selected goal       DIET - INCREASE WATER INTAKE     Have overall increased strength     Patient said she was stable and resides at [facility] She declined program support at present.     Care Coordination Interventions:  Active listening / Reflection utilized  Informed client of Care Coordination program support Client said she resides [facility]. She said her needs are met through [facility]. She was appreciative of call but said she did not need Care Coordination program support at present         This is a list of the screening recommended for you and due dates:  Health Maintenance  Topic Date Due   Mammogram  07/19/2022   COVID-19 Vaccine (4 - 2023-24 season) 07/27/2022   Hemoglobin A1C  12/15/2022   Eye exam for diabetics  01/15/2023   Yearly kidney function blood test for diabetes  06/17/2023   Yearly kidney health urinalysis for diabetes  06/17/2023   Complete foot exam   06/17/2023   Medicare Annual Wellness Visit  07/15/2023   DTaP/Tdap/Td vaccine (3 - Td or Tdap) 05/01/2031   Pneumonia Vaccine  Completed   Flu Shot  Completed   DEXA scan (bone density measurement)  Completed   Zoster (Shingles) Vaccine  Completed   HPV Vaccine  Aged Out     Next appointment: Follow up in one year for your annual wellness visit.   Preventive Care 50 Years and Older,  Female Preventive care refers to lifestyle choices and visits with your health care provider that can promote health and wellness. What does preventive care include? A yearly physical exam. This is also called an annual well check. Dental exams once or twice a year. Routine eye exams. Ask your health care provider how often you should have your eyes checked. Personal lifestyle choices, including: Daily care of your teeth and gums. Regular physical activity. Eating a healthy diet. Avoiding tobacco and drug use. Limiting alcohol use. Practicing safe sex. Taking low-dose aspirin every day. Taking vitamin and mineral supplements as recommended by your health care provider. What happens during an annual well check? The services and screenings done by your health care provider during your annual well check will depend on your age, overall health, lifestyle risk factors, and family history of disease. Counseling  Your health care provider may ask you questions about your: Alcohol use. Tobacco use. Drug use. Emotional well-being. Home and relationship well-being. Sexual activity. Eating habits. History of falls. Memory and ability to understand (cognition). Work and work Statistician. Reproductive health. Screening  You may have  the following tests or measurements: Height, weight, and BMI. Blood pressure. Lipid and cholesterol levels. These may be checked every 5 years, or more frequently if you are over 48 years old. Skin check. Lung cancer screening. You may have this screening every year starting at age 83 if you have a 30-pack-year history of smoking and currently smoke or have quit within the past 15 years. Fecal occult blood test (FOBT) of the stool. You may have this test every year starting at age 31. Flexible sigmoidoscopy or colonoscopy. You may have a sigmoidoscopy every 5 years or a colonoscopy every 10 years starting at age 33. Hepatitis C blood test. Hepatitis B blood  test. Sexually transmitted disease (STD) testing. Diabetes screening. This is done by checking your blood sugar (glucose) after you have not eaten for a while (fasting). You may have this done every 1-3 years. Bone density scan. This is done to screen for osteoporosis. You may have this done starting at age 9. Mammogram. This may be done every 1-2 years. Talk to your health care provider about how often you should have regular mammograms. Talk with your health care provider about your test results, treatment options, and if necessary, the need for more tests. Vaccines  Your health care provider may recommend certain vaccines, such as: Influenza vaccine. This is recommended every year. Tetanus, diphtheria, and acellular pertussis (Tdap, Td) vaccine. You may need a Td booster every 10 years. Zoster vaccine. You may need this after age 55. Pneumococcal 13-valent conjugate (PCV13) vaccine. One dose is recommended after age 72. Pneumococcal polysaccharide (PPSV23) vaccine. One dose is recommended after age 49. Talk to your health care provider about which screenings and vaccines you need and how often you need them. This information is not intended to replace advice given to you by your health care provider. Make sure you discuss any questions you have with your health care provider. Document Released: 06/12/2015 Document Revised: 02/03/2016 Document Reviewed: 03/17/2015 Elsevier Interactive Patient Education  2017 [place] Prevention in the Home Falls can cause injuries. They can happen to people of all ages. There are many things you can do to make your home safe and to help prevent falls. What can I do on the outside of my home? Regularly fix the edges of walkways and driveways and fix any cracks. Remove anything that might make you trip as you walk through a door, such as a raised step or threshold. Trim any bushes or trees on the path to your home. Use bright outdoor  lighting. Clear any walking paths of anything that might make someone trip, such as rocks or tools. Regularly check to see if handrails are loose or broken. Make sure that both sides of any steps have handrails. Any raised decks and porches should have guardrails on the edges. Have any leaves, snow, or ice cleared regularly. Use sand or salt on walking paths during winter. Clean up any spills in your garage right away. This includes oil or grease spills. What can I do in the bathroom? Use night lights. Install grab bars by the toilet and in the tub and shower. Do not use towel bars as grab bars. Use non-skid mats or decals in the tub or shower. If you need to sit down in the shower, use a plastic, non-slip stool. Keep the floor dry. Clean up any water that spills on the floor as soon as it happens. Remove soap buildup in the tub or shower regularly. Attach bath mats  securely with double-sided non-slip rug tape. Do not have throw rugs and other things on the floor that can make you trip. What can I do in the bedroom? Use night lights. Make sure that you have a light by your bed that is easy to reach. Do not use any sheets or blankets that are too big for your bed. They should not hang down onto the floor. Have a firm chair that has side arms. You can use this for support while you get dressed. Do not have throw rugs and other things on the floor that can make you trip. What can I do in the kitchen? Clean up any spills right away. Avoid walking on wet floors. Keep items that you use a lot in easy-to-reach places. If you need to reach something above you, use a strong step stool that has a grab bar. Keep electrical cords out of the way. Do not use floor polish or wax that makes floors slippery. If you must use wax, use non-skid floor wax. Do not have throw rugs and other things on the floor that can make you trip. What can I do with my stairs? Do not leave any items on the stairs. Make  sure that there are handrails on both sides of the stairs and use them. Fix handrails that are broken or loose. Make sure that handrails are as long as the stairways. Check any carpeting to make sure that it is firmly attached to the stairs. Fix any carpet that is loose or worn. Avoid having throw rugs at the top or bottom of the stairs. If you do have throw rugs, attach them to the floor with carpet tape. Make sure that you have a light switch at the top of the stairs and the bottom of the stairs. If you do not have them, ask someone to add them for you. What else can I do to help prevent falls? Wear shoes that: Do not have high heels. Have rubber bottoms. Are comfortable and fit you well. Are closed at the toe. Do not wear sandals. If you use a stepladder: Make sure that it is fully opened. Do not climb a closed stepladder. Make sure that both sides of the stepladder are locked into place. Ask someone to hold it for you, if possible. Clearly mark and make sure that you can see: Any grab bars or handrails. First and last steps. Where the edge of each step is. Use tools that help you move around (mobility aids) if they are needed. These include: Canes. Walkers. Scooters. Crutches. Turn on the lights when you go into a dark area. Replace any light bulbs as soon as they burn out. Set up your furniture so you have a clear path. Avoid moving your furniture around. If any of your floors are uneven, fix them. If there are any pets around you, be aware of where they are. Review your medicines with your doctor. Some medicines can make you feel dizzy. This can increase your chance of falling. Ask your doctor what other things that you can do to help prevent falls. This information is not intended to replace advice given to you by your health care provider. Make sure you discuss any questions you have with your health care provider. Document Released: 03/12/2009 Document Revised: 10/22/2015  Document Reviewed: 06/20/2014 Elsevier Interactive Patient Education  2017 [employer].

## 2022-07-29 DIAGNOSIS — M5416 Radiculopathy, lumbar region: Secondary | ICD-10-CM | POA: Diagnosis not present

## 2022-07-30 ENCOUNTER — Other Ambulatory Visit: Payer: Self-pay | Admitting: Family Medicine

## 2022-07-30 DIAGNOSIS — E1140 Type 2 diabetes mellitus with diabetic neuropathy, unspecified: Secondary | ICD-10-CM

## 2022-08-01 NOTE — Telephone Encounter (Signed)
Requesting: [place] Contract: 06/16/22 UDS: 06/16/22 Last OV: 06/16/22 Next OV: 12/15/2022 Last Refill: 02/07/2022, #[id number] RF Database:   Please advise

## 2022-09-15 DIAGNOSIS — E785 Hyperlipidemia, unspecified: Secondary | ICD-10-CM | POA: Diagnosis not present

## 2022-09-15 DIAGNOSIS — E1169 Type 2 diabetes mellitus with other specified complication: Secondary | ICD-10-CM | POA: Diagnosis not present

## 2022-09-16 LAB — LIPID PANEL
Chol/HDL Ratio: 2.5 ratio (ref 0.0–4.4)
Cholesterol, Total: 161 mg/dL (ref 100–199)
HDL: 64 mg/dL (ref >39)
LDL Chol Calc (NIH): 77 mg/dL (ref 0–99)
Triglycerides: 115 mg/dL (ref 0–149)
VLDL Cholesterol Cal: 20 mg/dL (ref 5–40)

## 2022-09-21 ENCOUNTER — Ambulatory Visit (HOSPITAL_BASED_OUTPATIENT_CLINIC_OR_DEPARTMENT_OTHER): Payer: Medicare Other | Admitting: Internal Medicine

## 2022-09-21 ENCOUNTER — Encounter (HOSPITAL_BASED_OUTPATIENT_CLINIC_OR_DEPARTMENT_OTHER): Payer: Self-pay | Admitting: Internal Medicine

## 2022-09-21 VITALS — BP 148/69 | HR 56 | Ht 63.5 in | Wt 160.6 lb

## 2022-09-21 DIAGNOSIS — E781 Pure hyperglyceridemia: Secondary | ICD-10-CM | POA: Diagnosis not present

## 2022-09-21 DIAGNOSIS — I2510 Atherosclerotic heart disease of native coronary artery without angina pectoris: Secondary | ICD-10-CM | POA: Diagnosis not present

## 2022-09-21 DIAGNOSIS — E785 Hyperlipidemia, unspecified: Secondary | ICD-10-CM

## 2022-09-21 NOTE — Progress Notes (Signed)
LIPID CLINIC CONSULT NOTE  Chief Complaint:  Follow-up dyslipidemia  Primary Care Physician: Carollee Herter, Alferd Apa, DO  Primary Cardiologist:  None  HPI:  [name] is a 82 y.o. female who is being seen today for the evaluation of dyslipidemia at the request of [name], *. This is a 23-year-old female kindly referred for evaluation management of dyslipidemia.  She has a history of type 2 diabetes, dyslipidemia, mild carotid artery disease and coronary artery calcification seen previously on a CT scan.  She has a twin sister who apparently has coronary disease and has had 3 stents.  Has any history of prior cardiac events or any symptoms such as angina, dyspnea on exertion or decreased exercise tolerance.  Recently she has been residing at [facility].  She says that she eats "very well" there.  Her recent labs show an increase in triglycerides specifically that are quite high at 604, total cholesterol 222 and a direct LDL of 61.  Hemoglobin A1c is 7.1.  She unfortunately is intolerant of simvastatin which she took previously and caused her muscle weakness however at the time she had significant scoliosis and subsequently underwent spinal surgery.  She has not taken any additional statins.  She is currently on ezetimibe.  This is well-tolerated.  She has occasionally taken over-the-counter fish oil but not regularly.  Diet is variable but she does probably get a decent amount of sweets and baked goods.  01/12/2021  Ms. Blades is seen today in follow-up.  She has had a significant improvement in her triglycerides from over 600 down to 337, total cholesterol 222 2 and LDL 121.  Direct LDL was measured at 99 however this indicates an increase compared to her previous values.  After discussing with her a little bit she told me that she actually had stopped her ezetimibe and was confused as to whether or not she was supposed to continue that and the Vascepa or the Vascepa was to replace  that.  I informed her that she should have stayed on the ezetimibe.  She does report some recent improvement in her A1c.  She is trying to be more active.  03/19/2021  [name] returns today for follow-up.  Unfortunately she says her diet recently has been not ideal.  Her PCP had reassessed her lipids and shows an increase in her cholesterol.  Triglycerides specifically are now up to 511 with total cholesterol 218 and a direct LDL of 55.  She had restarted her ezetimibe which seems to have helped with her LDL however triglycerides are again high.  She reports compliance with Vascepa.  Her activity level has gone down.  She reports her twin sister is actually hospitalized currently and may have heart catheterization today.  07/21/2021  [name] is seen today in follow-up.  After several months of dietary interventions, her triglycerides remain elevated.  Repeat lipids showed a total cholesterol 211, triglycerides 454 (down slightly from 511), HDL 40 and LDL 95.  Direct LDL measured at 54.  She is on ezetimibe and Vascepa 2 g twice daily.  03/04/2022  [name] returns today for follow-up.  She has done very well on additional therapy for her cholesterol.  I advised adding fenofibrate and she has had an excellent response.  Total cholesterol now 165, down from 211.  Triglycerides are 93, but were as high as 5-600 in the past.  HDL of 65 and LDL of 83.  Overall she is tolerating the medicine  well.  She reported she had COVID-19 in August.  She was treated with Paxlovid and steroids.  She recovered nicely from that.  She has reduced some of her alcohol intake as well to only about once a week.  09/21/2022  [name] is seen today in follow-up.  Her cholesterol continues to look good.  Her LDL is actually the lowest it has been in some time.  Total now 161, triglycerides 115, HDL 64 and LDL 77.  PMHx:  Past Medical History:  Diagnosis Date   Anemia    Arthritis    Asthma    adult onset   Basal  cell cancer    LUE; Porokeratosis also   Chronic kidney disease 1963   strep in kidney due to strep throat-hospitalized 10 days   Complication of anesthesia    small trachea   Diabetes mellitus 2010   A1c 6.7%   DVT (deep venous thrombosis) 2006   post immobilization post cns surgery   GERD (gastroesophageal reflux disease)    very mild   Granulomatous lung disease 2002   incidental Xray finding   Hyperlipidemia    Hypertension 2004   Hypertensive response on Stress Test   Paralysis 2006   post cervical fusion with spinal sac tear  with hematoma    PTE (pulmonary thromboembolism) 2006    Past Surgical History:  Procedure Laterality Date   ABDOMINAL EXPOSURE N/A 08/12/2016   Procedure: ABDOMINAL EXPOSURE;  Surgeon: Larina Earthly, MD;  Location: [facility] OR;  Service: Vascular;  Laterality: N/A;   ANTERIOR LAT LUMBAR FUSION N/A 08/12/2016   Procedure: LUMBAR TWO-THREE, LUMBAR THREE-FOUR, LUMBAR FOUR-FIVE  ANTEROLATERAL LUMBAR INTERBODY FUSION;  Surgeon: Maeola Harman, MD;  Location: [facility] OR;  Service: Neurosurgery;  Laterality: N/A;  L2-3 L3-4 L4-5 Anterolateral lumbar interbody fusion   ANTERIOR LUMBAR FUSION N/A 08/12/2016   Procedure: Lumbar Five-Sacral One Anterior lumbar interbody fusion with Dr. Tawanna Cooler Early to assist;  Surgeon: Maeola Harman, MD;  Location: [facility] OR;  Service: Neurosurgery;  Laterality: N/A;  L5-S1 Anterior lumbar interbody fusion with Dr. Tawanna Cooler Early to assist   BUNIONECTOMY     CATARACT EXTRACTION Right 12/31/2018   CATARACT EXTRACTION Left 12/17/2018   CERVICAL FUSION  2006   Dr Jordan Likes, NS;post op hematoma & cns leak & urinary retention   COLONOSCOPY  1992 & 2002   negative   epidural steroids  2006   cervical spine   LUMBAR PERCUTANEOUS PEDICLE SCREW 4 LEVEL N/A 08/12/2016   Procedure: LUMBAR TWO-SACRAL ONE Percuataneous Pedicle Screws;  Surgeon: Maeola Harman, MD;  Location: [facility] OR;  Service: Neurosurgery;  Laterality: N/A;   ROTATOR CUFF REPAIR  2009   Right    SEPTOPLASTY     TONSILLECTOMY  82 years old   TOTAL HIP ARTHROPLASTY  06/01/2012   Procedure: TOTAL HIP ARTHROPLASTY ANTERIOR APPROACH;  Surgeon: Kathryne Hitch, MD;  Location: [facility] ORS;  Service: Orthopedics;  Laterality: Left;  Left Total Hip Arthroplasty   TOTAL HIP ARTHROPLASTY Right 02/23/2018   Procedure: RIGHT TOTAL HIP ARTHROPLASTY ANTERIOR APPROACH;  Surgeon: Kathryne Hitch, MD;  Location: [facility] ORS;  Service: Orthopedics;  Laterality: Right;   TUBAL LIGATION      FAMHx:  Family History  Problem Relation Age of Onset   COPD Father        emphysema   Cancer Mother        cns cancer   Diabetes Sister        TWIN sister ; also  Fibromyalgia ; S/P stent 2004   Heart disease Sister        stents @ 60 & 68   Stroke Maternal Grandmother        in  late 54s   Transient ischemic attack Paternal Aunt     SOCHx:   reports that she has never smoked. She has been exposed to tobacco smoke. She has never used smokeless tobacco. She reports current alcohol use of about 1.0 standard drink of alcohol per week. She reports that she does not use drugs.  ALLERGIES:  Allergies  Allergen Reactions   Statins Other (See Comments)    Myalgias and muscle weakness    ROS: Pertinent items noted in HPI and remainder of comprehensive ROS otherwise negative.  HOME MEDS: Current Outpatient Medications on File Prior to Visit  Medication Sig Dispense Refill   Accu-Chek Softclix Lancets lancets USE TO TEST BLOOD SUGAR TWICE DAILY AS DIRECTED 100 each 12   albuterol (PROVENTIL) (2.5 MG/3ML) 0.083% nebulizer solution Take 3 mLs (2.5 mg total) by nebulization every 6 (six) hours as needed for wheezing or shortness of breath. 75 mL 12   albuterol (VENTOLIN HFA) 108 (90 Base) MCG/ACT inhaler USE 2 INHALATIONS BY MOUTH  EVERY 4 TO 6 HOURS AS  NEEDED 51 g 3   alendronate (FOSAMAX) 70 MG tablet TAKE 1 TABLET(70 MG) BY MOUTH 1 TIME A WEEK 12 tablet 3   aspirin EC 81 MG tablet Take 81 mg by mouth  daily.     azelastine (ASTELIN) 0.1 % nasal spray Use 1-2 puffs in each nostril once or twice daily 30 mL 12   blood glucose meter kit and supplies KIT Dispense based on patient and insurance preference. Use up to four times daily as directed. 1 each 0   Calcium Citrate-Vitamin D (CALCIUM + D PO) Take 600 mg by mouth in the morning and at bedtime.     celecoxib (CELEBREX) 200 MG capsule TAKE 1 CAPSULE(200 MG) BY MOUTH DAILY 90 capsule 3   cetirizine (ZYRTEC) 10 MG tablet Take 10 mg by mouth daily as needed for allergies.     cholecalciferol (VITAMIN D3) 25 MCG (1000 UNIT) tablet Take 1,000 Units by mouth daily.     cyclobenzaprine (FLEXERIL) 10 MG tablet TAKE 1 TABLET BY MOUTH THREE TIMES DAILY AS NEEDED FOR HIP CONCERNS 30 tablet 2   diclofenac Sodium (VOLTAREN) 1 % GEL Apply topically daily as needed (Pain).     ezetimibe (ZETIA) 10 MG tablet TAKE 1 TABLET(10 MG) BY MOUTH DAILY 90 tablet 1   fenofibrate 160 MG tablet TAKE 1 TABLET(160 MG) BY MOUTH DAILY 90 tablet 3   ferrous sulfate (FEROSUL) 325 (65 FE) MG tablet Take 1 tablet (325 mg total) by mouth daily with breakfast. 90 tablet 1   fluticasone-salmeterol (ADVAIR DISKUS) 250-50 MCG/ACT AEPB USE 1 INHALATION BY MOUTH  TWICE DAILY RINSE MOUTH  AFTER USE 180 each 3   glucose blood (ACCU-CHEK GUIDE) test strip USE TO TEST BLOOD SUGAR UP TO TWICE DAILY AS DIRECTED 100 strip 12   Lidocaine 4 % PTCH Apply 1 patch topically as needed (back pain).     metFORMIN (GLUCOPHAGE-XR) 500 MG 24 hr tablet Take 1 tablet with morning meal, 1 tablet with lunch and 2 tablets with evening meal. 360 tablet 3   mirabegron ER (MYRBETRIQ) 50 MG TB24 tablet Take 1 tablet (50 mg total) by mouth daily. 90 tablet 3   Multiple Vitamin (MULTIVITAMIN WITH MINERALS) TABS tablet Take 1  tablet by mouth daily. Centrum One a day - 50+     Multiple Vitamins-Minerals (PRESERVISION AREDS 2+MULTI VIT PO) Take 1 tablet by mouth in the morning and at bedtime.     mupirocin ointment  (BACTROBAN) 2 % Apply 1 application topically 3 (three) times daily. 30 g 3   NONFORMULARY OR COMPOUNDED ITEM Pt for cervical arthritis and low back pain 1 each 0   Polyethyl Glycol-Propyl Glycol (SYSTANE OP) Place 1 drop into both eyes daily as needed (dry eyes).     polyethylene glycol (MIRALAX / GLYCOLAX) packet Take 17 g by mouth daily as needed for mild constipation.      pregabalin (LYRICA) 100 MG capsule TAKE 1 CAPSULE BY MOUTH THREE TIMES DAILY 270 capsule 1   traMADol (ULTRAM) 50 MG tablet Take 1 tablet by mouth as needed.     Trolamine Salicylate (ASPERCREME EX) Apply 1 application topically daily as needed (back pain).     VASCEPA 1 g capsule Take 2 capsules ( 2 grams ) twice a day 360 capsule 3   No current facility-administered medications on file prior to visit.    LABS/IMAGING: No results found for this or any previous visit (from the past 48 hour(s)). No results found.  LIPID PANEL:    Component Value Date/Time   CHOL 161 09/15/2022 1008   TRIG 115 09/15/2022 1008   HDL 64 09/15/2022 1008   CHOLHDL 2.5 09/15/2022 1008   CHOLHDL 2 06/16/2022 1027   VLDL 20.8 06/16/2022 1027   LDLCALC 77 09/15/2022 1008   LDLDIRECT 54 07/15/2021 1005   LDLDIRECT 55.0 03/15/2021 1418    WEIGHTS: Wt Readings from Last 3 Encounters:  09/21/22 160 lb 9.6 oz (72.8 kg)  06/16/22 155 lb 6.4 oz (70.5 kg)  05/26/22 157 lb 3.2 oz (71.3 kg)    VITALS: BP (!) 148/69 (BP Location: Left Arm, Patient Position: Sitting, Cuff Size: Normal)   Pulse (!) 56   Ht 5' 3.5" (1.613 m)   Wt 160 lb 9.6 oz (72.8 kg)   SpO2 98%   BMI 28.00 kg/m   EXAM: Deferred    EKG: Deferred  ASSESSMENT: Mixed dyslipidemia with high triglycerides Type 2 diabetes-A1c 7.1 Mild carotid artery disease Coronary artery calcification on CT scan Family history of coronary artery disease in her twin sister  PLAN: 1.   Ms. Cavagnaro continues to have a marked reduction in her lipids.  LDL now 77 and she says that  she has not been as active as she normally is.  I suspect this may have been improved further.  She has no coronary symptoms at the time.  We will continue her current therapies.  Plan follow-up with me annually or sooner as necessary.  Chrystie Nose, MD, [facility], FACP    [facility]  Medical Director of the Advanced Lipid Disorders &  Cardiovascular Risk Reduction Clinic Diplomate of the American Board of Clinical Lipidology Attending Cardiologist  Direct Dial: [phone]  Fax: [phone]  Website:  [email or web address]  Lisette Abu Keeghan Bialy 09/21/2022, 11:22 AM

## 2022-09-21 NOTE — Patient Instructions (Signed)
Medication Instructions:  NO CHANGES  *If you need a refill on your cardiac medications before your next appointment, please call your pharmacy*   Lab Work: FASTING lab work in 1 year  If you have labs (blood work) drawn today and your tests are completely normal, you will receive your results only by: MyChart Message (if you have MyChart) OR A paper copy in the mail If you have any lab test that is abnormal or we need to change your treatment, we will call you to review the results.    Follow-Up: At [facility], you and your health needs are our priority.  As part of our continuing mission to provide you with exceptional heart care, we have created designated Provider Care Teams.  These Care Teams include your primary Cardiologist (physician) and Advanced Practice Providers (APPs -  Physician Assistants and Nurse Practitioners) who all work together to provide you with the care you need, when you need it.  We recommend signing up for the patient portal called "MyChart".  Sign up information is provided on this After Visit Summary.  MyChart is used to connect with patients for Virtual Visits (Telemedicine).  Patients are able to view lab/test results, encounter notes, upcoming appointments, etc.  Non-urgent messages can be sent to your provider as well.   To learn more about what you can do with MyChart, go to [email or web address].    Your next appointment:    12 months with Dr. Hilty 

## 2022-10-03 ENCOUNTER — Other Ambulatory Visit: Payer: Self-pay | Admitting: Internal Medicine

## 2022-10-04 DIAGNOSIS — H02729 Madarosis of unspecified eye, unspecified eyelid and periocular area: Secondary | ICD-10-CM | POA: Diagnosis not present

## 2022-10-04 DIAGNOSIS — L821 Other seborrheic keratosis: Secondary | ICD-10-CM | POA: Diagnosis not present

## 2022-10-04 DIAGNOSIS — L738 Other specified follicular disorders: Secondary | ICD-10-CM | POA: Diagnosis not present

## 2022-10-04 DIAGNOSIS — Z85828 Personal history of other malignant neoplasm of skin: Secondary | ICD-10-CM | POA: Diagnosis not present

## 2022-11-18 ENCOUNTER — Other Ambulatory Visit: Payer: Self-pay | Admitting: Family Medicine

## 2022-11-18 DIAGNOSIS — D509 Iron deficiency anemia, unspecified: Secondary | ICD-10-CM

## 2022-12-15 ENCOUNTER — Ambulatory Visit: Payer: Medicare Other | Admitting: Family Medicine

## 2022-12-15 ENCOUNTER — Encounter: Payer: Self-pay | Admitting: Family Medicine

## 2022-12-15 VITALS — BP 128/60 | HR 62 | Temp 97.8°F | Resp 18 | Ht 63.5 in | Wt 157.2 lb

## 2022-12-15 DIAGNOSIS — E782 Mixed hyperlipidemia: Secondary | ICD-10-CM

## 2022-12-15 DIAGNOSIS — N3281 Overactive bladder: Secondary | ICD-10-CM

## 2022-12-15 DIAGNOSIS — I10 Essential (primary) hypertension: Secondary | ICD-10-CM

## 2022-12-15 DIAGNOSIS — E785 Hyperlipidemia, unspecified: Secondary | ICD-10-CM

## 2022-12-15 DIAGNOSIS — E1165 Type 2 diabetes mellitus with hyperglycemia: Secondary | ICD-10-CM | POA: Diagnosis not present

## 2022-12-15 DIAGNOSIS — E1140 Type 2 diabetes mellitus with diabetic neuropathy, unspecified: Secondary | ICD-10-CM | POA: Diagnosis not present

## 2022-12-15 DIAGNOSIS — E1169 Type 2 diabetes mellitus with other specified complication: Secondary | ICD-10-CM

## 2022-12-15 LAB — CBC WITH DIFFERENTIAL/PLATELET
Basophils Absolute: 0 10*3/uL (ref 0.0–0.1)
Basophils Relative: 0.8 % (ref 0.0–3.0)
Eosinophils Absolute: 0.2 10*3/uL (ref 0.0–0.7)
Eosinophils Relative: 2.6 % (ref 0.0–5.0)
HCT: 35.8 % — ABNORMAL LOW (ref 36.0–46.0)
Hemoglobin: 11.9 g/dL — ABNORMAL LOW (ref 12.0–15.0)
Lymphocytes Relative: 31.1 % (ref 12.0–46.0)
Lymphs Abs: 2 10*3/uL (ref 0.7–4.0)
MCHC: 33.3 g/dL (ref 30.0–36.0)
MCV: 87.4 fl (ref 78.0–100.0)
Monocytes Absolute: 0.5 10*3/uL (ref 0.1–1.0)
Monocytes Relative: 7.8 % (ref 3.0–12.0)
Neutro Abs: 3.7 10*3/uL (ref 1.4–7.7)
Neutrophils Relative %: 57.7 % (ref 43.0–77.0)
Platelets: 234 10*3/uL (ref 150.0–400.0)
RBC: 4.1 Mil/uL (ref 3.87–5.11)
RDW: 15 % (ref 11.5–15.5)
WBC: 6.4 10*3/uL (ref 4.0–10.5)

## 2022-12-15 LAB — COMPREHENSIVE METABOLIC PANEL
ALT: 20 U/L (ref 0–35)
AST: 20 U/L (ref 0–37)
Albumin: 4.1 g/dL (ref 3.5–5.2)
Alkaline Phosphatase: 37 U/L — ABNORMAL LOW (ref 39–117)
BUN: 21 mg/dL (ref 6–23)
CO2: 29 mEq/L (ref 19–32)
Calcium: 10.5 mg/dL (ref 8.4–10.5)
Chloride: 104 mEq/L (ref 96–112)
Creatinine, Ser: 1.1 mg/dL (ref 0.40–1.20)
GFR: 47.01 mL/min — ABNORMAL LOW (ref >60.00)
Glucose, Bld: 153 mg/dL — ABNORMAL HIGH (ref 70–99)
Potassium: 4.5 mEq/L (ref 3.5–5.1)
Sodium: 140 mEq/L (ref 135–145)
Total Bilirubin: 0.4 mg/dL (ref 0.2–1.2)
Total Protein: 6.5 g/dL (ref 6.0–8.3)

## 2022-12-15 LAB — MICROALBUMIN / CREATININE URINE RATIO
Creatinine,U: 55.7 mg/dL
Microalb Creat Ratio: 1.3 mg/g (ref 0.0–30.0)
Microalb, Ur: <0.7 mg/dL (ref 0.0–1.9)

## 2022-12-15 LAB — LIPID PANEL
Cholesterol: 164 mg/dL (ref 0–200)
HDL: 56.3 mg/dL (ref >39.00)
LDL Cholesterol: 87 mg/dL (ref 0–99)
NonHDL: 107.54
Total CHOL/HDL Ratio: 3
Triglycerides: 103 mg/dL (ref 0.0–149.0)
VLDL: 20.6 mg/dL (ref 0.0–40.0)

## 2022-12-15 LAB — HEMOGLOBIN A1C: Hgb A1c MFr Bld: 6.9 % — ABNORMAL HIGH (ref 4.6–6.5)

## 2022-12-15 NOTE — Assessment & Plan Note (Signed)
Myrbetriq ---  Stable

## 2022-12-15 NOTE — Assessment & Plan Note (Signed)
Well controlled, no changes to meds. Encouraged heart healthy diet such as the DASH diet and exercise as tolerated.  °
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
## 2022-12-15 NOTE — Assessment & Plan Note (Signed)
Encourage heart healthy diet such as MIND or DASH diet, increase exercise, avoid trans fats, simple carbohydrates and processed foods, consider a krill or fish or flaxseed oil cap daily.  °

## 2022-12-15 NOTE — Patient Instructions (Signed)

## 2023-01-03 ENCOUNTER — Other Ambulatory Visit: Payer: Self-pay | Admitting: Family Medicine

## 2023-01-03 DIAGNOSIS — N3281 Overactive bladder: Secondary | ICD-10-CM

## 2023-01-09 ENCOUNTER — Telehealth: Payer: Self-pay | Admitting: Family Medicine

## 2023-01-09 DIAGNOSIS — E1169 Type 2 diabetes mellitus with other specified complication: Secondary | ICD-10-CM

## 2023-01-09 MED ORDER — EZETIMIBE 10 MG PO TABS
10.0000 mg | ORAL_TABLET | Freq: Every day | ORAL | 1 refills | Status: DC
Start: 2023-01-09 — End: 2023-06-22

## 2023-01-09 NOTE — Telephone Encounter (Signed)
Refill sent.

## 2023-01-09 NOTE — Addendum Note (Signed)
Addended by: Roxanne Gates on: 01/09/2023 11:26 AM   Modules accepted: Orders

## 2023-01-09 NOTE — Telephone Encounter (Signed)
Pt needs refill on Ezetimibe. Please send to [facility] on [address]

## 2023-01-18 DIAGNOSIS — E1165 Type 2 diabetes mellitus with hyperglycemia: Secondary | ICD-10-CM | POA: Diagnosis not present

## 2023-01-18 DIAGNOSIS — H35433 Paving stone degeneration of retina, bilateral: Secondary | ICD-10-CM | POA: Diagnosis not present

## 2023-01-18 DIAGNOSIS — H5212 Myopia, left eye: Secondary | ICD-10-CM | POA: Diagnosis not present

## 2023-01-18 DIAGNOSIS — H52223 Regular astigmatism, bilateral: Secondary | ICD-10-CM | POA: Diagnosis not present

## 2023-01-18 DIAGNOSIS — H26492 Other secondary cataract, left eye: Secondary | ICD-10-CM | POA: Diagnosis not present

## 2023-01-18 DIAGNOSIS — H40013 Open angle with borderline findings, low risk, bilateral: Secondary | ICD-10-CM | POA: Diagnosis not present

## 2023-01-18 DIAGNOSIS — Z961 Presence of intraocular lens: Secondary | ICD-10-CM | POA: Diagnosis not present

## 2023-01-18 DIAGNOSIS — H524 Presbyopia: Secondary | ICD-10-CM | POA: Diagnosis not present

## 2023-01-18 DIAGNOSIS — H35372 Puckering of macula, left eye: Secondary | ICD-10-CM | POA: Diagnosis not present

## 2023-01-18 LAB — HM DIABETES EYE EXAM

## 2023-02-17 ENCOUNTER — Other Ambulatory Visit: Payer: Self-pay | Admitting: Family Medicine

## 2023-02-17 DIAGNOSIS — E1140 Type 2 diabetes mellitus with diabetic neuropathy, unspecified: Secondary | ICD-10-CM

## 2023-02-17 NOTE — Telephone Encounter (Signed)
Prescription Request  02/17/2023  Is this a "Controlled Substance" medicine? Yes  LOV: 12/15/2022  What is the name of the medication or equipment?   pregabalin (LYRICA) 100 MG capsule [[id number]]  Have you contacted your pharmacy to request a refill? Yes   Which pharmacy would you like this sent to?   [id number] [place] Otto, [place] - [address] AT [facility] [address] [facility] [address] [place] [address] Phone: [phone] Fax: [phone]  Patient notified that their request is being sent to the clinical staff for review and that they should receive a response within 2 business days.   Please advise at Mobile [phone] (mobile)

## 2023-02-20 MED ORDER — PREGABALIN 100 MG PO CAPS
ORAL_CAPSULE | ORAL | 1 refills | Status: DC
Start: 2023-02-20 — End: 2023-08-14

## 2023-02-20 NOTE — Addendum Note (Signed)
Addended by: Roxanne Gates on: 02/20/2023 08:44 AM   Modules accepted: Orders

## 2023-02-20 NOTE — Telephone Encounter (Signed)
Requesting: [employer] Contract: 06/16/22 UDS: 06/16/22  Last OV: 12/15/22 Next OV: 06/20/23 Last Refill: 08/01/2022, #270--1 RF Database:   Please advise

## 2023-04-10 ENCOUNTER — Other Ambulatory Visit: Payer: Self-pay | Admitting: Family Medicine

## 2023-04-26 ENCOUNTER — Telehealth: Payer: Self-pay | Admitting: Family Medicine

## 2023-04-26 NOTE — Telephone Encounter (Signed)
Pt called to ask if her cyclobenzaprine could be switched to a different medication as it is inhibiting her ability to drive. Pt stated the medication is not affecting her in any other way.

## 2023-05-01 ENCOUNTER — Other Ambulatory Visit: Payer: Self-pay | Admitting: Family Medicine

## 2023-05-01 DIAGNOSIS — M545 Low back pain, unspecified: Secondary | ICD-10-CM

## 2023-05-01 MED ORDER — METHOCARBAMOL 500 MG PO TABS
500.0000 mg | ORAL_TABLET | Freq: Four times a day (QID) | ORAL | 1 refills | Status: DC | PRN
Start: 2023-05-01 — End: 2023-08-21

## 2023-05-02 DIAGNOSIS — L565 Disseminated superficial actinic porokeratosis (DSAP): Secondary | ICD-10-CM | POA: Diagnosis not present

## 2023-05-02 DIAGNOSIS — Z85828 Personal history of other malignant neoplasm of skin: Secondary | ICD-10-CM | POA: Diagnosis not present

## 2023-05-02 DIAGNOSIS — L821 Other seborrheic keratosis: Secondary | ICD-10-CM | POA: Diagnosis not present

## 2023-05-02 DIAGNOSIS — D225 Melanocytic nevi of trunk: Secondary | ICD-10-CM | POA: Diagnosis not present

## 2023-05-02 NOTE — Telephone Encounter (Signed)
Pt notified that rx was sent in 

## 2023-05-03 DIAGNOSIS — M48062 Spinal stenosis, lumbar region with neurogenic claudication: Secondary | ICD-10-CM | POA: Diagnosis not present

## 2023-05-08 ENCOUNTER — Telehealth: Payer: Self-pay | Admitting: Internal Medicine

## 2023-05-08 NOTE — Telephone Encounter (Signed)
[facility] calling stating  Advair will not be on Formulary next year.   Alternatives are:  Symbicort (Brand Name Only) Wixela   What would Dr. Raelene Bott?

## 2023-05-11 NOTE — Telephone Encounter (Signed)
We can change Advair to Wixela 250-    inhale 1 puff then rinse mouth, twice daily  # 1, ref x 12

## 2023-05-13 ENCOUNTER — Other Ambulatory Visit: Payer: Self-pay | Admitting: Family Medicine

## 2023-05-13 DIAGNOSIS — D509 Iron deficiency anemia, unspecified: Secondary | ICD-10-CM

## 2023-05-15 NOTE — Telephone Encounter (Signed)
Patient states she had a bad cough with Wixela after taking for 1 month. She says she can't tolerate Wixela. Patient is scheduled to see you on 05/29/23. She says you all can discuss at visit.

## 2023-05-25 ENCOUNTER — Other Ambulatory Visit: Payer: Self-pay

## 2023-05-25 DIAGNOSIS — E1165 Type 2 diabetes mellitus with hyperglycemia: Secondary | ICD-10-CM

## 2023-05-25 MED ORDER — METFORMIN HCL ER 500 MG PO TB24: ORAL_TABLET | ORAL | 3 refills | Status: AC

## 2023-05-28 NOTE — Progress Notes (Unsigned)
HPI female never smoker followed for allergic rhinitis, asthma, complicated by history embolic CVA complicating C-spine surgery, DM 2  ---------------------------------------------------------------------   05/26/22- 82 year old female never smoker followed for allergic Rhinitis, Asthma, complicated by history embolic CVA complicating C-spine surgery, DM 2, hx DVT/PE 2006, HTN, Covid infection 01/15/22,  Follows for: asthma, allergic rhinitis  -Ventolin hfa, Dymista, Advair 250,  Covid vax- 4 Moderna Flu vax-had Covid infection 8/19> Paxlovid renal dosing ACT score-24    Pt is doing well She thinks Paxlovid helped her Covid infection and she is back to baseline. Prednisone for asthma then made her "hyper". She got the neb medicine but has not yet gotten the compressor. We will notify [employer]. CXR 01/15/22- IMPRESSION: No active cardiopulmonary disease.  05/29/23-  82 year old female never smoker followed for allergic Rhinitis, Asthma, complicated by history embolic CVA complicating C-spine surgery, DM 2, hx DVT/PE 2006, HTN, Covid infection 01/15/22,  Follows for: asthma, allergic rhinitis  -Ventolin hfa, Dymista, Neb Albuterol, Discussed the use of AI scribe software for clinical note transcription with the patient, who gave verbal consent to proceed.  History of Present Illness   The patient, with a history of asthma, presents for a routine follow-up. She reports that her insurance will no longer cover Advair, her current asthma medication. She has three remaining inhalers and is considering switching to Symbicort, as suggested by her insurance. She has previously tried another medication (referred to as Izell Daguao'), which caused a persistent cough and a sensation of not getting enough air. She is open to trying Symbicort but would like to wait until the new year due to insurance costs. She also requests a refill of her emergency albuterol inhaler, noting that her previous inhalers have lasted  longer than the listed expiration date.  The patient also has a history of allergic rhinitis, for which she previously used a Dymista inhaler. She has since found an over-the-counter medication that works as well, in combination with [employer]. She also used to take allergy shots, but has discontinued them after 20 years.  The patient reports no recent illnesses or infections, including COVID-19. She has received five COVID-19 vaccinations and is up to date with her flu shot.     ROV-see HPI + = positive Constitutional:   No-   weight loss, night sweats, fevers, chills, fatigue, lassitude. HEENT:   No-  headaches, difficulty swallowing, tooth/dental problems, sore throat,       No- sneezing, itching, ear ache, +nasal congestion, post nasal drip,  CV:  chest pain, no-orthopnea, PND, swelling in lower extremities, anasarca, dizziness, palpitations Resp: No-   shortness of breath with exertion or at rest.              No-   productive cough,  No non-productive cough,  No- coughing up of blood.              No-   change in color of mucus.  + Wheezing.   Skin: No-   rash or lesions. GI:  GU:  MS:  No-   joint pain or swelling.  No- decreased range of motion.  Back pain after surgery Neuro-     nothing unusual Psych:  No- change in mood or affect. No depression or anxiety.  No memory loss.  OBJ General- Alert, Oriented, Affect-appropriate, Distress- none acute, looks very well Skin- rash-none, lesions- none, excoriation- none Lymphadenopathy- none Head- atraumatic            Eyes- Gross vision intact, PERRLA, conjunctivae  clear secretions            Ears- Hearing, canals-normal            Nose- Clear, no-Septal dev, mucus, polyps, erosion, perforation             Throat- Mallampati II , mucosa clear , drainage- none, tonsils- atrophic, +mild hoarseness Neck- flexible , trachea midline, no stridor , thyroid nl, carotid no bruit Chest - symmetrical excursion , unlabored           Heart/CV- RRR  , no murmur , no gallop  , no rub, nl s1 s2                           - JVD- none , edema- none, stasis changes- none, varices- none           Lung- Clear,  Wheeze- none, cough-none , dullness-none, rub- none           Chest wall-  Abd-  Br/ Gen/ Rectal- Not done, not indicated Extrem- cyanosis- none, clubbing, none, atrophy- none, strength- nl, + cane Neuro- grossly intact to observation  Assessment and Plan    Asthma Patient currently on Advair, but insurance will no longer cover it. Patient has three months supply left. Discussed switching to Symbicort, which patient agreed to. Patient also uses an albuterol inhaler for emergencies, which is still effective despite being past its listed expiration date. -Plan to switch to Symbicort in March when Advair runs out. -Continue use of albuterol inhaler as needed for emergencies.  Allergic Rhinitis Patient previously used Dymista, but has found an over-the-counter alternative that works well in combination with [employer]. Patient also used to take allergy shots, but has stopped after 20 years. -Continue current regimen of over-the-counter nasal spray and Allegra.  General Health Maintenance Patient has received five COVID-19 vaccinations and is up-to-date with flu vaccination. -No further action needed at this time.

## 2023-05-29 ENCOUNTER — Encounter: Payer: Self-pay | Admitting: Internal Medicine

## 2023-05-29 ENCOUNTER — Ambulatory Visit: Payer: Medicare Other | Admitting: Internal Medicine

## 2023-05-29 VITALS — BP 132/72 | HR 60 | Temp 97.6°F | Resp 18 | Ht 63.5 in | Wt 158.8 lb

## 2023-05-29 DIAGNOSIS — J4530 Mild persistent asthma, uncomplicated: Secondary | ICD-10-CM

## 2023-05-29 MED ORDER — ALBUTEROL SULFATE HFA 108 (90 BASE) MCG/ACT IN AERS: INHALATION_SPRAY | RESPIRATORY_TRACT | 3 refills | Status: DC

## 2023-05-29 NOTE — Patient Instructions (Signed)
Glad you are doing well. Please call if we can help.  Your insurance wants to change from Advair to Symbicort.  Since you have 3 Advairs left, please let us know when you are ready to re-order, and we will send in a prescription for Symbicort.

## 2023-06-20 ENCOUNTER — Encounter: Payer: Medicare Other | Admitting: Family Medicine

## 2023-06-22 ENCOUNTER — Encounter: Payer: Self-pay | Admitting: Family Medicine

## 2023-06-22 ENCOUNTER — Ambulatory Visit: Payer: Medicare Other | Admitting: Family Medicine

## 2023-06-22 VITALS — BP 132/60 | HR 61 | Temp 97.8°F | Resp 18 | Ht 63.5 in | Wt 156.2 lb

## 2023-06-22 DIAGNOSIS — Z7984 Long term (current) use of oral hypoglycemic drugs: Secondary | ICD-10-CM

## 2023-06-22 DIAGNOSIS — E1165 Type 2 diabetes mellitus with hyperglycemia: Secondary | ICD-10-CM

## 2023-06-22 DIAGNOSIS — I10 Essential (primary) hypertension: Secondary | ICD-10-CM | POA: Diagnosis not present

## 2023-06-22 DIAGNOSIS — G8929 Other chronic pain: Secondary | ICD-10-CM | POA: Diagnosis not present

## 2023-06-22 DIAGNOSIS — M545 Low back pain, unspecified: Secondary | ICD-10-CM | POA: Diagnosis not present

## 2023-06-22 DIAGNOSIS — E785 Hyperlipidemia, unspecified: Secondary | ICD-10-CM

## 2023-06-22 DIAGNOSIS — E1169 Type 2 diabetes mellitus with other specified complication: Secondary | ICD-10-CM

## 2023-06-22 LAB — MICROALBUMIN / CREATININE URINE RATIO
Creatinine,U: 73.9 mg/dL
Microalb Creat Ratio: 0.9 mg/g (ref 0.0–30.0)
Microalb, Ur: <0.7 mg/dL (ref 0.0–1.9)

## 2023-06-22 LAB — CBC WITH DIFFERENTIAL/PLATELET
Basophils Absolute: 0 10*3/uL (ref 0.0–0.1)
Basophils Relative: 0.6 % (ref 0.0–3.0)
Eosinophils Absolute: 0.2 10*3/uL (ref 0.0–0.7)
Eosinophils Relative: 3.2 % (ref 0.0–5.0)
HCT: 37.6 % (ref 36.0–46.0)
Hemoglobin: 12.3 g/dL (ref 12.0–15.0)
Lymphocytes Relative: 30.6 % (ref 12.0–46.0)
Lymphs Abs: 1.9 10*3/uL (ref 0.7–4.0)
MCHC: 32.8 g/dL (ref 30.0–36.0)
MCV: 91.3 fL (ref 78.0–100.0)
Monocytes Absolute: 0.4 10*3/uL (ref 0.1–1.0)
Monocytes Relative: 7.4 % (ref 3.0–12.0)
Neutro Abs: 3.5 10*3/uL (ref 1.4–7.7)
Neutrophils Relative %: 58.2 % (ref 43.0–77.0)
Platelets: 258 10*3/uL (ref 150.0–400.0)
RBC: 4.12 Mil/uL (ref 3.87–5.11)
RDW: 15.4 % (ref 11.5–15.5)
WBC: 6.1 10*3/uL (ref 4.0–10.5)

## 2023-06-22 LAB — COMPREHENSIVE METABOLIC PANEL
ALT: 19 U/L (ref 0–35)
AST: 19 U/L (ref 0–37)
Albumin: 4.3 g/dL (ref 3.5–5.2)
Alkaline Phosphatase: 33 U/L — ABNORMAL LOW (ref 39–117)
BUN: 22 mg/dL (ref 6–23)
CO2: 29 meq/L (ref 19–32)
Calcium: 9.7 mg/dL (ref 8.4–10.5)
Chloride: 103 meq/L (ref 96–112)
Creatinine, Ser: 1.15 mg/dL (ref 0.40–1.20)
GFR: 44.4 mL/min — ABNORMAL LOW (ref >60.00)
Glucose, Bld: 133 mg/dL — ABNORMAL HIGH (ref 70–99)
Potassium: 4.7 meq/L (ref 3.5–5.1)
Sodium: 139 meq/L (ref 135–145)
Total Bilirubin: 0.4 mg/dL (ref 0.2–1.2)
Total Protein: 6.6 g/dL (ref 6.0–8.3)

## 2023-06-22 LAB — LIPID PANEL
Cholesterol: 163 mg/dL (ref 0–200)
HDL: 59.6 mg/dL (ref >39.00)
LDL Cholesterol: 81 mg/dL (ref 0–99)
NonHDL: 103.29
Total CHOL/HDL Ratio: 3
Triglycerides: 111 mg/dL (ref 0.0–149.0)
VLDL: 22.2 mg/dL (ref 0.0–40.0)

## 2023-06-22 LAB — HEMOGLOBIN A1C: Hgb A1c MFr Bld: 7.4 % — ABNORMAL HIGH (ref 4.6–6.5)

## 2023-06-22 MED ORDER — EZETIMIBE 10 MG PO TABS: 10.0000 mg | ORAL_TABLET | Freq: Every day | ORAL | 1 refills | Status: DC

## 2023-06-22 MED ORDER — CELECOXIB 200 MG PO CAPS: ORAL_CAPSULE | ORAL | 3 refills | Status: AC

## 2023-06-22 NOTE — Assessment & Plan Note (Signed)
Encourage heart healthy diet such as MIND or DASH diet, increase exercise, avoid trans fats, simple carbohydrates and processed foods, consider a krill or fish or flaxseed oil cap daily.  °
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
## 2023-06-22 NOTE — Assessment & Plan Note (Signed)
 Hgba1c to be checked  minimize simple carbs. Increase exercise as tolerated. Continue current meds

## 2023-06-22 NOTE — Assessment & Plan Note (Signed)
Well controlled, no changes to meds. Encouraged heart healthy diet such as the DASH diet and exercise as tolerated.  °

## 2023-06-24 ENCOUNTER — Encounter: Payer: Self-pay | Admitting: Family Medicine

## 2023-06-28 ENCOUNTER — Other Ambulatory Visit: Payer: Self-pay

## 2023-06-28 DIAGNOSIS — E1165 Type 2 diabetes mellitus with hyperglycemia: Secondary | ICD-10-CM

## 2023-06-28 DIAGNOSIS — Z1231 Encounter for screening mammogram for malignant neoplasm of breast: Secondary | ICD-10-CM

## 2023-06-28 MED ORDER — EMPAGLIFLOZIN 10 MG PO TABS: 10.0000 mg | ORAL_TABLET | Freq: Every day | ORAL | 2 refills | Status: DC

## 2023-06-29 ENCOUNTER — Telehealth: Payer: Self-pay | Admitting: Family Medicine

## 2023-06-29 NOTE — Telephone Encounter (Signed)
Copied from CRM [id number]. Topic: Medicare AWV >> Jun 29, 2023 10:14 AM Payton Doughty wrote: Reason for CRM: Called LVM 06/29/2023 to schedule AWV. Please schedule Virtual or Telehealth visits ONLY.   Verlee Rossetti; Care Guide Ambulatory Clinical Support [place] [facility] Direct Dial: [phone]

## 2023-07-09 ENCOUNTER — Other Ambulatory Visit: Payer: Self-pay | Admitting: Internal Medicine

## 2023-07-13 DIAGNOSIS — L820 Inflamed seborrheic keratosis: Secondary | ICD-10-CM | POA: Diagnosis not present

## 2023-07-24 ENCOUNTER — Other Ambulatory Visit: Payer: Self-pay | Admitting: Internal Medicine

## 2023-08-05 ENCOUNTER — Other Ambulatory Visit: Payer: Self-pay | Admitting: Family

## 2023-08-05 DIAGNOSIS — E1140 Type 2 diabetes mellitus with diabetic neuropathy, unspecified: Secondary | ICD-10-CM

## 2023-08-08 ENCOUNTER — Other Ambulatory Visit (HOSPITAL_BASED_OUTPATIENT_CLINIC_OR_DEPARTMENT_OTHER): Payer: Self-pay

## 2023-08-08 MED ORDER — AREXVY 120 MCG/0.5ML IM SUSR
INTRAMUSCULAR | 0 refills | Status: DC
  Filled 2023-08-08: qty 0.5, 1d supply, fill #0

## 2023-08-10 ENCOUNTER — Other Ambulatory Visit: Payer: Self-pay | Admitting: Family Medicine

## 2023-08-10 ENCOUNTER — Telehealth: Payer: Self-pay

## 2023-08-10 DIAGNOSIS — E1165 Type 2 diabetes mellitus with hyperglycemia: Secondary | ICD-10-CM

## 2023-08-10 MED ORDER — GLUCOSE BLOOD VI STRP: ORAL_STRIP | 12 refills | Status: AC

## 2023-08-10 NOTE — Telephone Encounter (Signed)
 Copied from CRM [id number]. Topic: Clinical - Prescription Issue >> Aug 10, 2023 12:54 PM Whitney O wrote: Reason for CRM: patient is requesting a refill for glucose blood (ACCU-CHEK GUIDE) test strip But when trying to do a refill request it saying no longer can order have to put in a new order. Please assist patient with this refill request

## 2023-08-10 NOTE — Addendum Note (Signed)
 Addended by: Roxanne Gates on: 08/10/2023 02:10 PM   Modules accepted: Orders

## 2023-08-10 NOTE — Telephone Encounter (Unsigned)
 Copied from CRM [id number]. Topic: Clinical - Medication Refill >> Aug 10, 2023 12:48 PM Dimitri Ped wrote: Most Recent Primary Care Visit:  Provider: Seabron Spates R  Department: [facility]  Visit Type: PHYSICAL  Date: 06/22/2023  Medication: glucose blood (ACCU-CHEK GUIDE) test strip   Has the patient contacted their pharmacy? Yes they said she had to contact doctor  (Agent: If no, request that the patient contact the pharmacy for the refill. If patient does not wish to contact the pharmacy document the reason why and proceed with request.) (Agent: If yes, when and what did the pharmacy advise?)  Is this the correct pharmacy for this prescription? Yes If no, delete pharmacy and type the correct one.  This is the patient's preferred pharmacy:  [id number] [place], [place] - [address] AT [facility] OF [facility] [address] & [facility] [address] [place] [address] Phone: [phone] Fax: [phone]  [facility]) - [place], [place] - [address] [facility] [address] [place] [address] [place] [address] Phone: [phone] Fax: [phone]  [facility] #[id number] [place], [place] - [address] AT [facility] OF [address] [place] [address] Phone: [phone] Fax: [phone]  [facility] - [place], [place] - [address] [place] [address] Phone: [phone] Fax: [phone]  [facility] [address] [place] [address] Phone: [phone] Fax: [phone]   Has the prescription been filled recently? No  Is the patient out of the medication? Yes  Has the patient been seen for an appointment in the last year OR does the patient have an upcoming appointment? Yes  Can we respond through MyChart? No  Agent: Please be advised that Rx refills may take up to  3 business days. We ask that you follow-up with your pharmacy.

## 2023-08-10 NOTE — Telephone Encounter (Signed)
 Rx sent

## 2023-08-14 ENCOUNTER — Telehealth: Payer: Self-pay | Admitting: Internal Medicine

## 2023-08-14 ENCOUNTER — Other Ambulatory Visit: Payer: Self-pay

## 2023-08-14 DIAGNOSIS — E1140 Type 2 diabetes mellitus with diabetic neuropathy, unspecified: Secondary | ICD-10-CM

## 2023-08-14 NOTE — Telephone Encounter (Signed)
 Requesting: Lyrica Contract: 05/2022 UDS: 05/2022 Last OV: 06/22/2023 Next OV: n/a Last Refill: 02/20/2023, #[id number]--1RF Database:   Please advise

## 2023-08-14 NOTE — Addendum Note (Signed)
 Addended by: Roxanne Gates on: 08/14/2023 02:08 PM   Modules accepted: Orders

## 2023-08-14 NOTE — Telephone Encounter (Signed)
 Patient states that insurance will no longer cover Advair. She is ready for a new prescription to be sent [facility].

## 2023-08-14 NOTE — Telephone Encounter (Signed)
 Copied from CRM [id number]. Topic: Clinical - Prescription Issue >> Aug 14, 2023 10:51 AM [name] wrote: Reason for CRM: Patient is following up on refill request for the pregabalin (LYRICA) 100 MG capsule submitted on 3/8.  Joyce Gross [phone]

## 2023-08-14 NOTE — Telephone Encounter (Signed)
 Prior auth team, which inhaler is covered under patients insurance plan as Advair is not a covered med.  Please advise.  Thank you.

## 2023-08-15 ENCOUNTER — Telehealth: Payer: Self-pay

## 2023-08-15 ENCOUNTER — Other Ambulatory Visit (HOSPITAL_COMMUNITY): Payer: Self-pay

## 2023-08-15 MED ORDER — PREGABALIN 100 MG PO CAPS: ORAL_CAPSULE | ORAL | 1 refills | Status: DC

## 2023-08-15 NOTE — Telephone Encounter (Addendum)
 This is a duplicate encounter.  Will close this encounter.   Left message for patient to call clinic.  Informed that Advair is covered under patients insurance. $141.00 for 3 months supply.  Need to ask patient if she wants [place] to call in rx and what pharmacy.  Patients last OV 05/29/2023 with Dr. Maple Hudson.  OV note states:  Return in about 6 months (around 11/27/2023).

## 2023-08-15 NOTE — Telephone Encounter (Signed)
*  Pulm  Pharmacy Patient Advocate Encounter   Received notification from Pt Calls Messages that prior authorization for Advair Diksus is required/requested.   Insurance verification completed.   The patient is insured through [facility] .   Per test claim: The current 90 day co-pay is, $141.00.  No PA needed at this time. This test claim was processed through [facility]- copay amounts may vary at other pharmacies due to pharmacy/plan contracts, or as the patient moves through the different stages of their insurance plan.

## 2023-08-15 NOTE — Telephone Encounter (Signed)
 Per test claims generic Advair Diskus is covered-PA not needed. $141.00 for 3 months through [facility]

## 2023-08-15 NOTE — Telephone Encounter (Signed)
 Left message for patient to call clinic.  Informed that Advair is covered under patients insurance. $141.00 for 3 months supply.  Need to ask patient if she wants [place] to call in rx and what pharmacy.  Patients last OV 05/29/2023 with Dr. Maple Hudson.  OV note states:  Return in about 6 months (around 11/27/2023).

## 2023-08-16 ENCOUNTER — Other Ambulatory Visit (HOSPITAL_BASED_OUTPATIENT_CLINIC_OR_DEPARTMENT_OTHER): Payer: Self-pay

## 2023-08-16 MED ORDER — FLUTICASONE-SALMETEROL 250-50 MCG/ACT IN AEPB
1.0000 | INHALATION_SPRAY | Freq: Two times a day (BID) | RESPIRATORY_TRACT | 1 refills | Status: DC
Start: 2023-08-16 — End: 2023-10-03
  Filled 2023-08-16: qty 180, 90d supply, fill #0
  Filled 2023-08-21: qty 60, 30d supply, fill #0
  Filled 2023-09-27: qty 60, 30d supply, fill #1

## 2023-08-16 NOTE — Telephone Encounter (Signed)
 Sent in rx for Advair Diskus 250/50/  one puff every 12 hours.  Disp # 90 days supply with one refill.  Patient will need OV in June per chart note:  (Return in about 6 months (around 11/27/2023).   Sent in rx per patients request.

## 2023-08-16 NOTE — Telephone Encounter (Signed)
 PT calling saying yes, she would like the RX called in to the [facility]. Her # is you have questions is [phone]

## 2023-08-21 ENCOUNTER — Other Ambulatory Visit (HOSPITAL_BASED_OUTPATIENT_CLINIC_OR_DEPARTMENT_OTHER): Payer: Self-pay

## 2023-08-21 ENCOUNTER — Other Ambulatory Visit: Payer: Self-pay | Admitting: Family Medicine

## 2023-08-21 DIAGNOSIS — M545 Low back pain, unspecified: Secondary | ICD-10-CM

## 2023-08-25 ENCOUNTER — Emergency Department (HOSPITAL_BASED_OUTPATIENT_CLINIC_OR_DEPARTMENT_OTHER): Admitting: Radiology

## 2023-08-25 ENCOUNTER — Emergency Department (HOSPITAL_BASED_OUTPATIENT_CLINIC_OR_DEPARTMENT_OTHER): Admission: EM | Admit: 2023-08-25 | Discharge: 2023-08-25 | Disposition: A | Discharge status: Home / Self Care

## 2023-08-25 ENCOUNTER — Encounter (HOSPITAL_BASED_OUTPATIENT_CLINIC_OR_DEPARTMENT_OTHER): Payer: Self-pay | Admitting: Emergency Medicine

## 2023-08-25 ENCOUNTER — Other Ambulatory Visit: Payer: Self-pay

## 2023-08-25 DIAGNOSIS — R0602 Shortness of breath: Secondary | ICD-10-CM | POA: Diagnosis not present

## 2023-08-25 DIAGNOSIS — R918 Other nonspecific abnormal finding of lung field: Secondary | ICD-10-CM | POA: Diagnosis not present

## 2023-08-25 DIAGNOSIS — Z7984 Long term (current) use of oral hypoglycemic drugs: Secondary | ICD-10-CM | POA: Diagnosis not present

## 2023-08-25 DIAGNOSIS — J40 Bronchitis, not specified as acute or chronic: Secondary | ICD-10-CM | POA: Insufficient documentation

## 2023-08-25 DIAGNOSIS — Z4789 Encounter for other orthopedic aftercare: Secondary | ICD-10-CM | POA: Diagnosis not present

## 2023-08-25 DIAGNOSIS — R059 Cough, unspecified: Secondary | ICD-10-CM | POA: Diagnosis not present

## 2023-08-25 DIAGNOSIS — Z7982 Long term (current) use of aspirin: Secondary | ICD-10-CM | POA: Diagnosis not present

## 2023-08-25 DIAGNOSIS — E119 Type 2 diabetes mellitus without complications: Secondary | ICD-10-CM | POA: Insufficient documentation

## 2023-08-25 LAB — CBC WITH DIFFERENTIAL/PLATELET
Abs Immature Granulocytes: 0.03 10*3/uL (ref 0.00–0.07)
Basophils Absolute: 0 10*3/uL (ref 0.0–0.1)
Basophils Relative: 1 %
Eosinophils Absolute: 0.3 10*3/uL (ref 0.0–0.5)
Eosinophils Relative: 5 %
HCT: 35.3 % — ABNORMAL LOW (ref 36.0–46.0)
Hemoglobin: 11.4 g/dL — ABNORMAL LOW (ref 12.0–15.0)
Immature Granulocytes: 1 %
Lymphocytes Relative: 23 %
Lymphs Abs: 1.5 10*3/uL (ref 0.7–4.0)
MCH: 28.4 pg (ref 26.0–34.0)
MCHC: 32.3 g/dL (ref 30.0–36.0)
MCV: 87.8 fL (ref 80.0–100.0)
Monocytes Absolute: 0.6 10*3/uL (ref 0.1–1.0)
Monocytes Relative: 10 %
Neutro Abs: 4 10*3/uL (ref 1.7–7.7)
Neutrophils Relative %: 60 %
Platelets: 232 10*3/uL (ref 150–400)
RBC: 4.02 MIL/uL (ref 3.87–5.11)
RDW: 15 % (ref 11.5–15.5)
WBC: 6.5 10*3/uL (ref 4.0–10.5)
nRBC: 0 % (ref 0.0–0.2)

## 2023-08-25 LAB — BASIC METABOLIC PANEL WITH GFR
Anion gap: 7 (ref 5–15)
BUN: 19 mg/dL (ref 8–23)
CO2: 26 mmol/L (ref 22–32)
Calcium: 9.2 mg/dL (ref 8.9–10.3)
Chloride: 107 mmol/L (ref 98–111)
Creatinine, Ser: 1.09 mg/dL — ABNORMAL HIGH (ref 0.44–1.00)
GFR, Estimated: 51 mL/min — ABNORMAL LOW (ref >60)
Glucose, Bld: 106 mg/dL — ABNORMAL HIGH (ref 70–99)
Potassium: 4.2 mmol/L (ref 3.5–5.1)
Sodium: 140 mmol/L (ref 135–145)

## 2023-08-25 LAB — RESP PANEL BY RT-PCR (RSV, FLU A&B, COVID)  RVPGX2
Influenza A by PCR: NEGATIVE
Influenza B by PCR: NEGATIVE
Resp Syncytial Virus by PCR: NEGATIVE
SARS Coronavirus 2 by RT PCR: NEGATIVE

## 2023-08-25 MED ORDER — DOXYCYCLINE HYCLATE 100 MG PO CAPS: 100.0000 mg | ORAL_CAPSULE | Freq: Two times a day (BID) | ORAL | 0 refills | Status: DC

## 2023-08-25 MED ORDER — IPRATROPIUM-ALBUTEROL 0.5-2.5 (3) MG/3ML IN SOLN
3.0000 mL | Freq: Once | RESPIRATORY_TRACT | Status: AC
  Administered 2023-08-25: 3 mL via RESPIRATORY_TRACT
  Filled 2023-08-25: qty 3

## 2023-08-25 MED ORDER — ALBUTEROL SULFATE HFA 108 (90 BASE) MCG/ACT IN AERS: 1.0000 | INHALATION_SPRAY | Freq: Four times a day (QID) | RESPIRATORY_TRACT | 0 refills | Status: DC | PRN

## 2023-08-25 NOTE — Discharge Instructions (Addendum)
 Please take the doxycycline as prescribed.  Use 2 puffs of the albuterol every 4 hours over the next 24 to 48 hours.  Follow-up with your doctor.  Return to the ER for worsening symptoms.

## 2023-08-25 NOTE — ED Provider Notes (Signed)
 [place] EMERGENCY DEPARTMENT AT [facility] Provider Note   CSN: [id number] Arrival date & time: 08/25/23  4696     History  Chief Complaint  Patient presents with   Cough    [name] is a 83 y.o. female.  83 year old female with past medical history of diabetes and remote history of DVT/PE presenting to the emergency department today with cough and nasal congestion as well as some shortness of breath.  The patient states that this has been going on now for the past 3 to 4 days.  The patient states that she has been having some dyspnea on exertion.  She denies any associated chest pain.  She states that she is coughing up and blowing her nose with some yellow mucus/phlegm.  She came to the ER today for further evaluation regarding this due to ongoing symptoms.        Home Medications Prior to Admission medications   Medication Sig Start Date End Date Taking? Authorizing Provider  albuterol (VENTOLIN HFA) 108 (90 Base) MCG/ACT inhaler Inhale 1-2 puffs into the lungs every 6 (six) hours as needed for wheezing or shortness of breath. 08/25/23  Yes Durwin Glaze, MD  doxycycline (VIBRAMYCIN) 100 MG capsule Take 1 capsule (100 mg total) by mouth 2 (two) times daily. 08/25/23  Yes Durwin Glaze, MD  Accu-Chek Softclix Lancets lancets USE TO TEST BLOOD SUGAR TWICE DAILY AS DIRECTED 06/11/21   Zola Button, Grayling Congress, DO  albuterol (PROVENTIL) (2.5 MG/3ML) 0.083% nebulizer solution Take 3 mLs (2.5 mg total) by nebulization every 6 (six) hours as needed for wheezing or shortness of breath. 11/23/21   Jetty Duhamel D, MD  albuterol (VENTOLIN HFA) 108 (90 Base) MCG/ACT inhaler USE 2 INHALATIONS BY MOUTH  EVERY 4 TO 6 HOURS AS  NEEDED 05/29/23   Jetty Duhamel D, MD  alendronate (FOSAMAX) 70 MG tablet TAKE 1 TABLET(70 MG) BY MOUTH 1 TIME A WEEK 04/10/23   Lowne Irish Elders, DO  aspirin EC 81 MG tablet Take 81 mg by mouth daily.    [provider]  azelastine (ASTELIN) 0.1  % nasal spray Use 1-2 puffs in each nostril once or twice daily 11/23/21   Waymon Budge, MD  blood glucose meter kit and supplies KIT Dispense based on patient and insurance preference. Use up to four times daily as directed. 08/11/20   Donato Schultz, DO  Calcium Citrate-Vitamin D (CALCIUM + D PO) Take 600 mg by mouth in the morning and at bedtime.    [provider]  celecoxib (CELEBREX) 200 MG capsule TAKE 1 CAPSULE(200 MG) BY MOUTH DAILY 06/22/23   Zola Button, Grayling Congress, DO  cetirizine (ZYRTEC) 10 MG tablet Take 10 mg by mouth daily as needed for allergies.    [provider]  cholecalciferol (VITAMIN D3) 25 MCG (1000 UNIT) tablet Take 1,000 Units by mouth daily.    [provider]  diclofenac Sodium (VOLTAREN) 1 % GEL Apply topically daily as needed (Pain).    [provider]  empagliflozin (JARDIANCE) 10 MG TABS tablet Take 1 tablet (10 mg total) by mouth daily before breakfast. 06/28/23   Zola Button, Grayling Congress, DO  ezetimibe (ZETIA) 10 MG tablet Take 1 tablet (10 mg total) by mouth daily. 06/22/23   Seabron Spates R, DO  fenofibrate 160 MG tablet TAKE 1 TABLET(160 MG) BY MOUTH DAILY 07/11/23   Hilty, Lisette Abu, MD  FEROSUL 325 (65 Fe) MG tablet TAKE  1 TABLET(325 MG) BY MOUTH DAILY WITH BREAKFAST 05/15/23   Zola Button, Yvonne R, DO  fluticasone-salmeterol (ADVAIR DISKUS) 250-50 MCG/ACT AEPB Inhale 1 puff into the lungs every 12 (twelve) hours. 08/16/23   Jetty Duhamel D, MD  glucose blood test strip Use to check sugars twice daily 08/10/23   Zola Button, Grayling Congress, DO  Lidocaine 4 % PTCH Apply 1 patch topically as needed (back pain).    [provider]  metFORMIN (GLUCOPHAGE-XR) 500 MG 24 hr tablet Take 1 tablet with morning meal, 1 tablet with lunch and 2 tablets with evening meal. 05/25/23   Zola Button, Grayling Congress, DO  methocarbamol (ROBAXIN) 500 MG tablet TAKE 1 TABLET(500 MG) BY MOUTH EVERY 6 HOURS AS NEEDED FOR MUSCLE SPASMS 08/21/23    Zola Button, Grayling Congress, DO  mirabegron ER (MYRBETRIQ) 50 MG TB24 tablet TAKE 1 TABLET(50 MG) BY MOUTH DAILY 01/03/23   Donato Schultz, DO  Multiple Vitamin (MULTIVITAMIN WITH MINERALS) TABS tablet Take 1 tablet by mouth daily. Centrum One a day - 50+    [provider]  Multiple Vitamins-Minerals (PRESERVISION AREDS 2+MULTI VIT PO) Take 1 tablet by mouth in the morning and at bedtime.    [provider]  mupirocin ointment (BACTROBAN) 2 % Apply 1 application topically 3 (three) times daily. 11/12/20   Donato Schultz, DO  NONFORMULARY OR COMPOUNDED ITEM Pt for cervical arthritis and low back pain 07/01/21   Zola Button, Myrene Buddy R, DO  Polyethyl Glycol-Propyl Glycol (SYSTANE OP) Place 1 drop into both eyes daily as needed (dry eyes).    [provider]  polyethylene glycol (MIRALAX / GLYCOLAX) packet Take 17 g by mouth daily as needed for mild constipation.     [provider]  pregabalin (LYRICA) 100 MG capsule TAKE 1 CAPSULE BY MOUTH THREE TIMES DAILY 08/15/23   Zola Button, Grayling Congress, DO  RSV vaccine recomb adjuvanted (AREXVY) 120 MCG/0.5ML injection Inject into the muscle. 08/08/23   Judyann Munson, MD  traMADol (ULTRAM) 50 MG tablet Take 1 tablet by mouth as needed. 01/25/21   [provider]  Trolamine Salicylate (ASPERCREME EX) Apply 1 application topically daily as needed (back pain).    [provider]  VASCEPA 1 g capsule TAKE 2 CAPSULES(2 GRAMS) BY MOUTH TWICE DAILY 07/24/23   Hilty, Lisette Abu, MD      Allergies    Statins    Review of Systems   Review of Systems  HENT:  Positive for congestion.   Respiratory:  Positive for cough and shortness of breath.   All other systems reviewed and are negative.   Physical Exam Updated Vital Signs BP (!) 146/64   Pulse 63   Temp 98.8 F (37.1 C)   Resp 20   Wt 68.5 kg   SpO2 98%   BMI 26.33 kg/m  Physical Exam Vitals and nursing note reviewed.   Gen: NAD Eyes: PERRL,  EOMI HEENT: no oropharyngeal swelling, + nasal congestion noted Neck: trachea midline Resp: clear to auscultation bilaterally Card: RRR, no murmurs, rubs, or gallops Abd: nontender, nondistended Extremities: no calf tenderness, no edema Vascular: 2+ radial pulses bilaterally, 2+ DP pulses bilaterally Skin: no rashes Psyc: acting appropriately   ED Results / Procedures / Treatments   Labs (all labs ordered are listed, but only abnormal results are displayed) Labs Reviewed  CBC WITH DIFFERENTIAL/PLATELET - Abnormal; Notable for the following components:      Result Value   Hemoglobin 11.4 (*)  HCT 35.3 (*)    All other components within normal limits  BASIC METABOLIC PANEL WITH GFR - Abnormal; Notable for the following components:   Glucose, Bld 106 (*)    Creatinine, Ser 1.09 (*)    GFR, Estimated 51 (*)    All other components within normal limits  RESP PANEL BY RT-PCR (RSV, FLU A&B, COVID)  [id number]    EKG EKG Interpretation Date/Time:  Friday August 25 2023 09:56:56 EDT Ventricular Rate:  58 PR Interval:  207 QRS Duration:  105 QT Interval:  414 QTC Calculation: 407 R Axis:   27  Text Interpretation: Sinus rhythm Confirmed by Beckey Downing [id number]) on 08/25/2023 10:12:32 AM  Radiology DG Chest 2 View Result Date: 08/25/2023 CLINICAL DATA:  Cough EXAM: CHEST - 2 VIEW COMPARISON:  Chest radiograph dated 01/15/2022 FINDINGS: Normal lung volumes. Patchy left basilar opacities. Irregular nodular density projecting over the left apex corresponds to degenerative osteophyte at the sternoclavicular joint. No pleural effusion or pneumothorax. The heart size and mediastinal contours are within normal limits. Cervical spinal fixation hardware appears intact. Partially imaged lumbar spinal fixation hardware appears intact. IMPRESSION: Patchy left basilar opacities, which may represent atelectasis or pneumonia. Electronically Signed   By: Agustin Cree M.D.   On: 08/25/2023 10:35     Procedures Procedures    Medications Ordered in ED Medications  ipratropium-albuterol (DUONEB) 0.5-2.5 (3) MG/3ML nebulizer solution 3 mL (3 mLs Nebulization Given 08/25/23 1145)    ED Course/ Medical Decision Making/ A&P                                 Medical Decision Making 83 year old female with past medical history of diabetes and remote history of DVT presenting to the emergency department today with cough and shortness of breath.  I will further evaluate the patient here with an EKG, chest x-ray, and COVID/flu swab on the patient.  The certainly does seem consistent with infectious etiology.  I think that if her EKG does not show any concerning findings for myocarditis or pericarditis that this would sufficiently rule this out.  Based on description of her symptoms suspicion for pulmonary embolism is low at this time.  I will reevaluate for ultimate disposition.  The patient's x-ray shows questionable pneumonia.  Her labs are reassuring.  She does not have a leukocytosis.  She remained well-appearing here with a pulse ox in the high 90s after a DuoNeb treatment.  I think that she is stable for discharge.  She is discharged with return precautions.  Amount and/or Complexity of Data Reviewed Labs: ordered. Radiology: ordered.  Risk Prescription drug management.           Final Clinical Impression(s) / ED Diagnoses Final diagnoses:  Bronchitis    Rx / DC Orders ED Discharge Orders          Ordered    doxycycline (VIBRAMYCIN) 100 MG capsule  2 times daily        08/25/23 1307    albuterol (VENTOLIN HFA) 108 (90 Base) MCG/ACT inhaler  Every 6 hours PRN        08/25/23 1308              Durwin Glaze, MD 08/25/23 1308

## 2023-08-28 ENCOUNTER — Other Ambulatory Visit: Payer: Self-pay | Admitting: Family Medicine

## 2023-08-28 ENCOUNTER — Telehealth: Payer: Self-pay

## 2023-08-28 MED ORDER — PREDNISONE 10 MG PO TABS
ORAL_TABLET | ORAL | 0 refills | Status: DC
Start: 2023-08-28 — End: 2023-12-25

## 2023-08-28 NOTE — Telephone Encounter (Signed)
 Copied from CRM [id number]. Topic: Clinical - Medical Advice >> Aug 28, 2023  8:45 AM Adaysia C wrote: Reason for CRM: Patient was diagnosed with bronchitis and prescribed doxycycline (VIBRAMYCIN) 100 MG capsules; patient is experiencing soreness in ribs, wheezing and coughing and would like to know from the providers if she should be taking Prednisone as well; please follow up with patient [phone]

## 2023-08-29 ENCOUNTER — Telehealth: Payer: Self-pay

## 2023-08-29 NOTE — Transitions of Care (Post Inpatient/ED Visit) (Signed)
 08/29/2023  Name: [name] MRN: [id number] DOB: 12-29-1940  Today's TOC FU Call Status: Today's TOC FU Call Status:: Successful TOC FU Call Completed TOC FU Call Complete Date: 08/29/23 Patient's Name and Date of Birth confirmed.  Transition Care Management Follow-up Telephone Call Date of Discharge: 08/25/23 Discharge Facility: [facility] ([facility]) Type of Discharge: Emergency Department Reason for ED Visit: Other: (Bronchitis) How have you been since you were released from the hospital?: Better Any questions or concerns?: No  Items Reviewed: Did you receive and understand the discharge instructions provided?: Yes Medications obtained,verified, and reconciled?: Yes (Medications Reviewed) Any new allergies since your discharge?: No Dietary orders reviewed?: NA Do you have support at home?: Yes  Medications Reviewed Today: Medications Reviewed Today     Reviewed by Anthoney Harada, LPN (Licensed Practical Nurse) on 08/29/23 at 1442  Med List Status: <None>   Medication Order Taking? Sig Documenting Provider Last Dose Status Informant  Accu-Chek Softclix Lancets lancets [id number] Yes USE TO TEST BLOOD SUGAR TWICE DAILY AS DIRECTED Zola Button, Grayling Congress, DO Taking Active   albuterol (PROVENTIL) (2.5 MG/3ML) 0.083% nebulizer solution [id number] Yes Take 3 mLs (2.5 mg total) by nebulization every 6 (six) hours as needed for wheezing or shortness of breath. Waymon Budge, MD Taking Active   albuterol (VENTOLIN HFA) 108 (90 Base) MCG/ACT inhaler [id number] Yes USE 2 INHALATIONS BY MOUTH  EVERY 4 TO 6 HOURS AS  NEEDED Young, Rennis Chris, MD Taking Active   albuterol (VENTOLIN HFA) 108 (90 Base) MCG/ACT inhaler [id number] Yes Inhale 1-2 puffs into the lungs every 6 (six) hours as needed for wheezing or shortness of breath. Durwin Glaze, MD Taking Active   alendronate (FOSAMAX) 70 MG tablet [id number] Yes TAKE 1 TABLET(70 MG) BY MOUTH 1 TIME A WEEK Lowne Irish Elders, DO Taking  Active   aspirin EC 81 MG tablet [id number] Yes Take 81 mg by mouth daily. [provider] Taking Active   azelastine (ASTELIN) 0.1 % nasal spray [id number] Yes Use 1-2 puffs in each nostril once or twice daily Young, Clinton D, MD Taking Active   blood glucose meter kit and supplies KIT [id number] Yes Dispense based on patient and insurance preference. Use up to four times daily as directed. Donato Schultz, DO Taking Active   Calcium Citrate-Vitamin D (CALCIUM + D PO) [id number] Yes Take 600 mg by mouth in the morning and at bedtime. [provider] Taking Active Self  celecoxib (CELEBREX) 200 MG capsule [id number] Yes TAKE 1 CAPSULE(200 MG) BY MOUTH DAILY Zola Button, Grayling Congress, DO Taking Active   cetirizine (ZYRTEC) 10 MG tablet [id number] Yes Take 10 mg by mouth daily as needed for allergies. [provider] Taking Active   cholecalciferol (VITAMIN D3) 25 MCG (1000 UNIT) tablet [id number] Yes Take 1,000 Units by mouth daily. [provider] Taking Active Self  diclofenac Sodium (VOLTAREN) 1 % GEL [id number] Yes Apply topically daily as needed (Pain). [provider] Taking Active   doxycycline (VIBRAMYCIN) 100 MG capsule [id number] Yes Take 1 capsule (100 mg total) by mouth 2 (two) times daily. Durwin Glaze, MD Taking Active   empagliflozin (JARDIANCE) 10 MG TABS tablet [id number] Yes Take 1 tablet (10 mg total) by mouth daily before breakfast. Zola Button, Grayling Congress, DO Taking Active   ezetimibe (ZETIA) 10 MG tablet [id number] Yes Take 1 tablet (10 mg total) by mouth daily. Seabron Spates R, DO Taking Active   fenofibrate 160 MG tablet  [id number] Yes TAKE 1 TABLET(160 MG) BY MOUTH DAILY Chrystie Nose, MD Taking Active   FEROSUL 325 (65 Fe) MG tablet [id number] Yes TAKE 1 TABLET(325 MG) BY MOUTH DAILY WITH BREAKFAST Zola Button, Grayling Congress, DO Taking Active   fluticasone-salmeterol (ADVAIR DISKUS) 250-50 MCG/ACT AEPB [id number] Yes Inhale 1 puff into the  lungs every 12 (twelve) hours. Jetty Duhamel D, MD Taking Active   glucose blood test strip [id number] Yes Use to check sugars twice daily Donato Schultz, DO Taking Active   Lidocaine 4 % [facility] [id number] Yes Apply 1 patch topically as needed (back pain). [provider] Taking Active Self  metFORMIN (GLUCOPHAGE-XR) 500 MG 24 hr tablet [id number] Yes Take 1 tablet with morning meal, 1 tablet with lunch and 2 tablets with evening meal. Zola Button, Grayling Congress, DO Taking Active   methocarbamol (ROBAXIN) 500 MG tablet [id number] Yes TAKE 1 TABLET(500 MG) BY MOUTH EVERY 6 HOURS AS NEEDED FOR MUSCLE SPASMS Donato Schultz, DO Taking Active   mirabegron ER (MYRBETRIQ) 50 MG TB24 tablet [id number] Yes TAKE 1 TABLET(50 MG) BY MOUTH DAILY Donato Schultz, DO Taking Active   Multiple Vitamin (MULTIVITAMIN WITH MINERALS) TABS tablet [id number] Yes Take 1 tablet by mouth daily. Centrum One a day - 50+ [provider] Taking Active Self  Multiple Vitamins-Minerals (PRESERVISION AREDS 2+MULTI VIT PO) [id number] Yes Take 1 tablet by mouth in the morning and at bedtime. [provider] Taking Active   mupirocin ointment (BACTROBAN) 2 % [id number] Yes Apply 1 application topically 3 (three) times daily. Seabron Spates R, DO Taking Active   NONFORMULARY OR COMPOUNDED ITEM [id number] Yes Pt for cervical arthritis and low back pain Zola Button, Grayling Congress, DO Taking Active   Polyethyl Glycol-Propyl Glycol (SYSTANE OP) [id number] Yes Place 1 drop into both eyes daily as needed (dry eyes). [provider] Taking Active Self  polyethylene glycol (MIRALAX / GLYCOLAX) packet [id number] Yes Take 17 g by mouth daily as needed for mild constipation.  [provider] Taking Active Self  predniSONE (DELTASONE) 10 MG tablet [id number] Yes TAKE 3 TABLETS PO QD FOR 3 DAYS THEN TAKE 2 TABLETS PO QD FOR 3 DAYS THEN TAKE 1 TABLET PO QD FOR 3 DAYS THEN TAKE 1/2 TAB PO QD FOR 3 DAYS Donato Schultz, DO Taking Active   pregabalin (LYRICA) 100 MG capsule [id number] Yes TAKE 1 CAPSULE BY MOUTH THREE TIMES DAILY Donato Schultz, DO Taking Active   RSV vaccine recomb adjuvanted (AREXVY) 120 MCG/0.5ML injection [id number] Yes Inject into the muscle. Judyann Munson, MD Taking Active   traMADol Janean Sark) 50 MG tablet [id number] Yes Take 1 tablet by mouth as needed. [provider] Taking Active            Med Note Marius Ditch Jun 14, 2021  9:46 AM)    Trolamine Salicylate (ASPERCREME [place]) [id number] Yes Apply 1 application topically daily as needed (back pain). [provider] Taking Active Self  VASCEPA 1 g capsule [id number] Yes TAKE 2 CAPSULES(2 GRAMS) BY MOUTH TWICE DAILY Hilty, Lisette Abu, MD Taking Active             Home Care and Equipment/Supplies: Were Home Health Services Ordered?: NA Any new equipment or medical supplies ordered?: NA  Functional Questionnaire: Do you need assistance with bathing/showering or dressing?: No Do you need assistance with meal preparation?: No Do you need assistance with eating?: No Do you  have difficulty maintaining continence: No Do you need assistance with getting out of bed/getting out of a chair/moving?: No Do you have difficulty managing or taking your medications?: No  Follow up appointments reviewed: PCP Follow-up appointment confirmed?: NA Specialist Hospital Follow-up appointment confirmed?: NA Do you need transportation to your follow-up appointment?: No Do you understand care options if your condition(s) worsen?: Yes-patient verbalized understanding    SIGNATURE Kandis Fantasia, LPN [facility] Health Advisor [place] [facility] You Are. We Are. [facility] Direct Dial [phone]

## 2023-08-29 NOTE — Telephone Encounter (Signed)
 Pt made aware

## 2023-09-20 ENCOUNTER — Other Ambulatory Visit (HOSPITAL_BASED_OUTPATIENT_CLINIC_OR_DEPARTMENT_OTHER): Payer: Self-pay | Admitting: Internal Medicine

## 2023-09-20 DIAGNOSIS — E785 Hyperlipidemia, unspecified: Secondary | ICD-10-CM | POA: Diagnosis not present

## 2023-09-21 LAB — LIPID PANEL
Chol/HDL Ratio: 3 ratio (ref 0.0–4.4)
Cholesterol, Total: 180 mg/dL (ref 100–199)
HDL: 61 mg/dL (ref >39)
LDL Chol Calc (NIH): 97 mg/dL (ref 0–99)
Triglycerides: 127 mg/dL (ref 0–149)
VLDL Cholesterol Cal: 22 mg/dL (ref 5–40)

## 2023-09-23 ENCOUNTER — Other Ambulatory Visit: Payer: Self-pay | Admitting: Family Medicine

## 2023-09-25 ENCOUNTER — Other Ambulatory Visit: Payer: Medicare Other

## 2023-09-26 ENCOUNTER — Encounter: Payer: Self-pay | Admitting: Internal Medicine

## 2023-09-26 ENCOUNTER — Ambulatory Visit: Payer: Medicare Other | Attending: Internal Medicine | Admitting: Internal Medicine

## 2023-09-26 VITALS — BP 160/62 | HR 62 | Ht 63.0 in | Wt 156.2 lb

## 2023-09-26 DIAGNOSIS — E785 Hyperlipidemia, unspecified: Secondary | ICD-10-CM | POA: Diagnosis not present

## 2023-09-26 DIAGNOSIS — I2510 Atherosclerotic heart disease of native coronary artery without angina pectoris: Secondary | ICD-10-CM

## 2023-09-26 DIAGNOSIS — I10 Essential (primary) hypertension: Secondary | ICD-10-CM | POA: Diagnosis not present

## 2023-09-26 DIAGNOSIS — I358 Other nonrheumatic aortic valve disorders: Secondary | ICD-10-CM

## 2023-09-26 NOTE — Patient Instructions (Addendum)
 Medication Instructions:  Your physician recommends that you continue on your current medications as directed. Please refer to the Current Medication list given to you today.  *If you need a refill on your cardiac medications before your next appointment, please call your pharmacy*  Lab Work: FASTING Lipid panel in 6 months If you have labs (blood work) drawn today and your tests are completely normal, you will receive your results only by: MyChart Message (if you have MyChart) OR A paper copy in the mail If you have any lab test that is abnormal or we need to change your treatment, we will call you to review the results.  Testing/Procedures: Your physician has requested that you have an echocardiogram. Echocardiography is a painless test that uses sound waves to create images of your heart. It provides your doctor with information about the size and shape of your heart and how well your heart's chambers and valves are working. This procedure takes approximately one hour. There are no restrictions for this procedure. Please do NOT wear cologne, perfume, aftershave, or lotions (deodorant is allowed). Please arrive 15 minutes prior to your appointment time.   Follow-Up: At [facility], you and your health needs are our priority.  As part of our continuing mission to provide you with exceptional heart care, our providers are all part of one team.  This team includes your primary Cardiologist (physician) and Advanced Practice Providers or APPs (Physician Assistants and Nurse Practitioners) who all work together to provide you with the care you need, when you need it.  Your next appointment:   12 month(s)  Provider:   Any APP or Dr. Maximo Spar  We recommend signing up for the patient portal called "MyChart".  Sign up information is provided on this After Visit Summary.  MyChart is used to connect with patients for Virtual Visits (Telemedicine).  Patients are able to view lab/test results,  encounter notes, upcoming appointments, etc.  Non-urgent messages can be sent to your provider as well.   To learn more about what you can do with MyChart, go to [email or web address].   Other Instructions Please keep blood pressure log and send to MyChart in 2 weeks HOW TO TAKE YOUR BLOOD PRESSURE: Rest 5 minutes before taking your blood pressure. Don't smoke or drink caffeinated beverages for at least 30 minutes before. Take your blood pressure before (not after) you eat. Sit comfortably with your back supported and both feet on the floor (don't cross your legs). Elevate your arm to heart level on a table or a desk. Use the proper sized cuff. It should fit smoothly and snugly around your bare upper arm. There should be enough room to slip a fingertip under the cuff. The bottom edge of the cuff should be 1 inch above the crease of the elbow. Ideally, take 3 measurements at one sitting and record the average.    Blood Pressure Log   Date   Time  Blood Pressure  Position  Example: Nov 1 9 AM 124/78 sitting  Blood Pressure Log   Date   Time  Blood Pressure  Position  Example: Nov 1 9 AM 124/78 sitting

## 2023-09-26 NOTE — Progress Notes (Signed)
 LIPID CLINIC CONSULT NOTE  Chief Complaint:  Follow-up  Primary Care Physician: Crecencio Dodge, Candida Chalk, DO  Primary Cardiologist:  None  HPI:  [name] is a 83 y.o. female who is being seen today for the evaluation of dyslipidemia at the request of Estill Hemming, *. This is a 28-year-old female kindly referred for evaluation management of dyslipidemia.  She has a history of type 2 diabetes, dyslipidemia, mild carotid artery disease and coronary artery calcification seen previously on a CT scan.  She has a twin sister who apparently has coronary disease and has had 3 stents.  Has any history of prior cardiac events or any symptoms such as angina, dyspnea on exertion or decreased exercise tolerance.  Recently she has been residing at [facility].  She says that she eats "very well" there.  Her recent labs show an increase in triglycerides specifically that are quite high at 604, total cholesterol 222 and a direct LDL of 61.  Hemoglobin A1c is 7.1.  She unfortunately is intolerant of simvastatin  which she took previously and caused her muscle weakness however at the time she had significant scoliosis and subsequently underwent spinal surgery.  She has not taken any additional statins.  She is currently on ezetimibe .  This is well-tolerated.  She has occasionally taken over-the-counter fish oil but not regularly.  Diet is variable but she does probably get a decent amount of sweets and baked goods.  01/12/2021  [name] is seen today in follow-up.  She has had a significant improvement in her triglycerides from over 600 down to 337, total cholesterol 222 2 and LDL 121.  Direct LDL was measured at 99 however this indicates an increase compared to her previous values.  After discussing with her a little bit she told me that she actually had stopped her ezetimibe  and was confused as to whether or not she was supposed to continue that and the Vascepa  or the Vascepa  was to replace that.  I  informed her that she should have stayed on the ezetimibe .  She does report some recent improvement in her A1c.  She is trying to be more active.  03/19/2021  [name] returns today for follow-up.  Unfortunately she says her diet recently has been not ideal.  Her PCP had reassessed her lipids and shows an increase in her cholesterol.  Triglycerides specifically are now up to 511 with total cholesterol 218 and a direct LDL of 55.  She had restarted her ezetimibe  which seems to have helped with her LDL however triglycerides are again high.  She reports compliance with Vascepa .  Her activity level has gone down.  She reports her twin sister is actually hospitalized currently and may have heart catheterization today.  07/21/2021  [name] is seen today in follow-up.  After several months of dietary interventions, her triglycerides remain elevated.  Repeat lipids showed a total cholesterol 211, triglycerides 454 (down slightly from 511), HDL 40 and LDL 95.  Direct LDL measured at 54.  She is on ezetimibe  and Vascepa  2 g twice daily.  03/04/2022  [name] returns today for follow-up.  She has done very well on additional therapy for her cholesterol.  I advised adding fenofibrate  and she has had an excellent response.  Total cholesterol now 165, down from 211.  Triglycerides are 93, but were as high as 5-600 in the past.  HDL of 65 and LDL of 83.  Overall she is tolerating the medicine well.  She reported she had COVID-19 in August.  She was treated with Paxlovid  and steroids.  She recovered nicely from that.  She has reduced some of her alcohol intake as well to only about once a week.  09/21/2022  [name] is seen today in follow-up.  Her cholesterol continues to look good.  Her LDL is actually the lowest it has been in some time.  Total now 161, triglycerides 115, HDL 64 and LDL 77.  09/26/2023  [name] returns today for follow-up.  She had recent lipids performed which showed total cholesterol  180, triglycerides 127, HDL 61 and LDL 97.  This represents a trend upwards in her cholesterol.  She reports she recently went on vacation and feels it might have affected it.  Also of note her blood pressure was elevated today 160/62.  I did a manual recheck after the visit since she did go to the wrong office today.  Blood pressure did come down somewhat at 150/62.  She does have somewhat of a wide pulse pressure.  She is noted to have a history of aortic murmur.  An echo in 2022 showed normal systolic function but there was aortic valve sclerosis.  She denies any chest pain or significant worsening shortness of breath.  She is a chronic pulmonary patient followed by Dr. Linder Revere.  PMHx:  Past Medical History:  Diagnosis Date   Anemia    Arthritis    Asthma    adult onset   Basal cell cancer    LUE; Porokeratosis also   Chronic kidney disease 1963   strep in kidney due to strep throat-hospitalized 10 days   Complication of anesthesia    small trachea   Diabetes mellitus 2010   A1c 6.7%   DVT (deep venous thrombosis) (HCC) 2006   post immobilization post cns surgery   GERD (gastroesophageal reflux disease)    very mild   Granulomatous lung disease (HCC) 2002   incidental Xray finding   Hyperlipidemia    Hypertension 2004   Hypertensive response on Stress Test   Paralysis (HCC) 2006   post cervical fusion with spinal sac tear  with hematoma    PTE (pulmonary thromboembolism) (HCC) 2006    Past Surgical History:  Procedure Laterality Date   ABDOMINAL EXPOSURE N/A 08/12/2016   Procedure: ABDOMINAL EXPOSURE;  Surgeon: Mayo Speck, MD;  Location[facility] OR;  Service: Vascular;  Laterality: N/A;   ANTERIOR LAT LUMBAR FUSION N/A 08/12/2016   Procedure: LUMBAR TWO-THREE, LUMBAR THREE-FOUR, LUMBAR FOUR-FIVE  ANTEROLATERAL LUMBAR INTERBODY FUSION;  Surgeon: Manya Sells, MD;  Location: [facility] OR;  Service: Neurosurgery;  Laterality: N/A;  L2-3 L3-4 L4-5 Anterolateral lumbar interbody fusion    ANTERIOR LUMBAR FUSION N/A 08/12/2016   Procedure: Lumbar Five-Sacral One Anterior lumbar interbody fusion with Dr. Ena Harries Early to assist;  Surgeon: Manya Sells, MD;  Location: [facility] OR;  Service: Neurosurgery;  Laterality: N/A;  L5-S1 Anterior lumbar interbody fusion with Dr. Ena Harries Early to assist   BUNIONECTOMY     CATARACT EXTRACTION Right 12/31/2018   CATARACT EXTRACTION Left 12/17/2018   CERVICAL FUSION  2006   Dr Adonis Alamin, NS;post op hematoma & cns leak & urinary retention   COLONOSCOPY  1992 & 2002   negative   epidural steroids  2006   cervical spine   LUMBAR PERCUTANEOUS PEDICLE SCREW 4 LEVEL N/A 08/12/2016   Procedure: LUMBAR TWO-SACRAL ONE Percuataneous Pedicle Screws;  Surgeon: Manya Sells, MD;  Location[facility] OR;  Service: Neurosurgery;  Laterality:  N/A;   ROTATOR CUFF REPAIR  2009   Right   SEPTOPLASTY     TONSILLECTOMY  83 years old   TOTAL HIP ARTHROPLASTY  06/01/2012   Procedure: TOTAL HIP ARTHROPLASTY ANTERIOR APPROACH;  Surgeon: Arnie Lao, MD;  Location: [facility] ORS;  Service: Orthopedics;  Laterality: Left;  Left Total Hip Arthroplasty   TOTAL HIP ARTHROPLASTY Right 02/23/2018   Procedure: RIGHT TOTAL HIP ARTHROPLASTY ANTERIOR APPROACH;  Surgeon: Arnie Lao, MD;  Location: [facility] ORS;  Service: Orthopedics;  Laterality: Right;   TUBAL LIGATION      FAMHx:  Family History  Problem Relation Age of Onset   COPD Father        emphysema   Cancer Mother        cns cancer   Diabetes Sister        TWIN sister ; also Fibromyalgia ; S/P stent 2004   Heart disease Sister        stents @ 34 & 29   Stroke Maternal Grandmother        in  late 15s   Transient ischemic attack Paternal Aunt     SOCHx:   reports that she has never smoked. She has been exposed to tobacco smoke. She has never used smokeless tobacco. She reports current alcohol use of about 1.0 standard drink of alcohol per week. She reports that she does not use drugs.  ALLERGIES:  Allergies  Allergen  Reactions   Statins Other (See Comments)    Myalgias and muscle weakness    ROS: Pertinent items noted in HPI and remainder of comprehensive ROS otherwise negative.  HOME MEDS: Current Outpatient Medications on File Prior to Visit  Medication Sig Dispense Refill   Accu-Chek Softclix Lancets lancets USE TO TEST BLOOD SUGAR TWICE DAILY AS DIRECTED 100 each 12   albuterol  (PROVENTIL ) (2.5 MG/3ML) 0.083% nebulizer solution Take 3 mLs (2.5 mg total) by nebulization every 6 (six) hours as needed for wheezing or shortness of breath. 75 mL 12   albuterol  (VENTOLIN  HFA) 108 (90 Base) MCG/ACT inhaler USE 2 INHALATIONS BY MOUTH  EVERY 4 TO 6 HOURS AS  NEEDED 51 g 3   albuterol  (VENTOLIN  HFA) 108 (90 Base) MCG/ACT inhaler Inhale 1-2 puffs into the lungs every 6 (six) hours as needed for wheezing or shortness of breath. 1 each 0   alendronate  (FOSAMAX ) 70 MG tablet TAKE 1 TABLET(70 MG) BY MOUTH 1 TIME A WEEK 12 tablet 3   blood glucose meter kit and supplies KIT Dispense based on patient and insurance preference. Use up to four times daily as directed. 1 each 0   Calcium Citrate-Vitamin D  (CALCIUM + D PO) Take 600 mg by mouth in the morning and at bedtime.     celecoxib  (CELEBREX ) 200 MG capsule TAKE 1 CAPSULE(200 MG) BY MOUTH DAILY 90 capsule 3   cetirizine (ZYRTEC) 10 MG tablet Take 10 mg by mouth daily as needed for allergies.     cholecalciferol  (VITAMIN D3) 25 MCG (1000 UNIT) tablet Take 1,000 Units by mouth daily.     diclofenac Sodium (VOLTAREN) 1 % GEL Apply topically daily as needed (Pain).     ezetimibe  (ZETIA ) 10 MG tablet Take 1 tablet (10 mg total) by mouth daily. 90 tablet 1   fenofibrate  160 MG tablet TAKE 1 TABLET(160 MG) BY MOUTH DAILY 90 tablet 0   FEROSUL 325 (65 Fe) MG tablet TAKE 1 TABLET(325 MG) BY MOUTH DAILY WITH BREAKFAST 90 tablet 1  fluticasone -salmeterol (ADVAIR  DISKUS) 250-50 MCG/ACT AEPB Inhale 1 puff into the lungs every 12 (twelve) hours. 180 each 1   glucose blood test  strip Use to check sugars twice daily 100 each 12   JARDIANCE  10 MG TABS tablet TAKE 1 TABLET(10 MG) BY MOUTH DAILY BEFORE BREAKFAST 30 tablet 2   Lidocaine  4 % PTCH Apply 1 patch topically as needed (back pain).     metFORMIN  (GLUCOPHAGE -XR) 500 MG 24 hr tablet Take 1 tablet with morning meal, 1 tablet with lunch and 2 tablets with evening meal. 360 tablet 3   methocarbamol  (ROBAXIN ) 500 MG tablet TAKE 1 TABLET(500 MG) BY MOUTH EVERY 6 HOURS AS NEEDED FOR MUSCLE SPASMS 45 tablet 1   mirabegron  ER (MYRBETRIQ ) 50 MG TB24 tablet TAKE 1 TABLET(50 MG) BY MOUTH DAILY 90 tablet 3   Multiple Vitamin (MULTIVITAMIN WITH MINERALS) TABS tablet Take 1 tablet by mouth daily. Centrum One a day - 50+     Multiple Vitamins-Minerals (PRESERVISION AREDS 2+MULTI VIT PO) Take 1 tablet by mouth in the morning and at bedtime.     mupirocin  ointment (BACTROBAN ) 2 % Apply 1 application topically 3 (three) times daily. 30 g 3   Polyethyl Glycol-Propyl Glycol (SYSTANE OP) Place 1 drop into both eyes daily as needed (dry eyes).     polyethylene glycol (MIRALAX  / GLYCOLAX ) packet Take 17 g by mouth daily as needed for mild constipation.      pregabalin  (LYRICA ) 100 MG capsule TAKE 1 CAPSULE BY MOUTH THREE TIMES DAILY 270 capsule 1   Trolamine Salicylate (ASPERCREME EX) Apply 1 application topically daily as needed (back pain).     VASCEPA  1 g capsule TAKE 2 CAPSULES(2 GRAMS) BY MOUTH TWICE DAILY 360 capsule 3   aspirin  EC 81 MG tablet Take 81 mg by mouth daily. (Patient not taking: Reported on 09/26/2023)     azelastine  (ASTELIN ) 0.1 % nasal spray Use 1-2 puffs in each nostril once or twice daily (Patient not taking: Reported on 09/26/2023) 30 mL 12   doxycycline  (VIBRAMYCIN ) 100 MG capsule Take 1 capsule (100 mg total) by mouth 2 (two) times daily. (Patient not taking: Reported on 09/26/2023) 20 capsule 0   predniSONE  (DELTASONE ) 10 MG tablet TAKE 3 TABLETS PO QD FOR 3 DAYS THEN TAKE 2 TABLETS PO QD FOR 3 DAYS THEN TAKE 1 TABLET  PO QD FOR 3 DAYS THEN TAKE 1/2 TAB PO QD FOR 3 DAYS (Patient not taking: Reported on 09/26/2023) 20 tablet 0   RSV vaccine recomb adjuvanted (AREXVY ) 120 MCG/0.5ML injection Inject into the muscle. (Patient not taking: Reported on 09/26/2023) 0.5 mL 0   traMADol  (ULTRAM ) 50 MG tablet Take 1 tablet by mouth as needed. (Patient not taking: Reported on 09/26/2023)     No current facility-administered medications on file prior to visit.    LABS/IMAGING: No results found for this or any previous visit (from the past 48 hours). No results found.  LIPID PANEL:    Component Value Date/Time   CHOL 180 09/20/2023 1118   TRIG 127 09/20/2023 1118   HDL 61 09/20/2023 1118   CHOLHDL 3.0 09/20/2023 1118   CHOLHDL 3 06/22/2023 1122   VLDL 22.2 06/22/2023 1122   LDLCALC 97 09/20/2023 1118   LDLDIRECT 54 07/15/2021 1005   LDLDIRECT 55.0 03/15/2021 1418    WEIGHTS: Wt Readings from Last 3 Encounters:  09/26/23 156 lb 3.2 oz (70.9 kg)  08/25/23 151 lb (68.5 kg)  06/22/23 156 lb 3.2 oz (70.9 kg)  VITALS: BP (!) 160/62 (BP Location: Left Arm, Patient Position: Sitting, Cuff Size: Normal)   Pulse 62   Ht 5\' 3"  (1.6 m)   Wt 156 lb 3.2 oz (70.9 kg)   SpO2 94%   BMI 27.67 kg/m   EXAM: General appearance: alert and no distress Lungs: diminished breath sounds bibasilar Heart: regular rate and rhythm, S1, S2 normal, and systolic murmur: systolic ejection 2/6, crescendo at 2nd right intercostal space Extremities: extremities normal, atraumatic, no cyanosis or edema Skin: Skin color, texture, turgor normal. No rashes or lesions Neurologic: Grossly normal   EKG: Deferred  ASSESSMENT: Mixed dyslipidemia with high triglycerides Type 2 diabetes-A1c 7.1 Mild carotid artery disease Coronary artery calcification on CT scan Family history of coronary artery disease in her twin sister Aortic murmur  PLAN: 1.   [name] seems to be doing well.  She was noted to have an aortic murmur with valve  sclerosis by echo in 2022 however the murmur is somewhat louder today and early to mid peaking suggesting maybe there is some aortic stenosis.  I like to update an echocardiogram.  Her blood pressure remained elevated even after recheck.  I advised her to monitor that at home.  I help reset her MyChart password so she can access and send us  updated blood pressure readings.  Finally her cholesterol is trending upwards.  She is going to continue to work on diet and lifestyle modifications for this.  Will plan to repeat lipid in 6 months rather than 1 year.  Otherwise no changes to her meds today.  I will contact her with the results of her echo.  Plan follow-up annually or sooner as necessary.  Hazle Lites, MD, [facility], FNLA, FACP    [facility]  Medical Director of the Advanced Lipid Disorders &  Cardiovascular Risk Reduction Clinic Diplomate of the American Board of Clinical Lipidology Attending Cardiologist  Direct Dial: [phone]  Fax: [phone]  Website:  [email or web address] 09/26/2023, 9:13 AM

## 2023-09-27 ENCOUNTER — Other Ambulatory Visit (HOSPITAL_BASED_OUTPATIENT_CLINIC_OR_DEPARTMENT_OTHER): Payer: Self-pay

## 2023-09-27 NOTE — Telephone Encounter (Signed)
 Please change Advair  to Breo 100, # 1, ref x 5    inhale 1 puff then rinse mouth, once daily

## 2023-09-27 NOTE — Telephone Encounter (Signed)
 Copied from CRM [id number]. Topic: Clinical - Medication Question >> Sep 27, 2023 12:31 PM Ilean Mall F wrote: Reason for CRM: Pt stated that her insurance will no longer pay for advair , but she was interested in seeing if Dr. Linder Revere would prescribe Breo or dulera  inhalers as they are both similar to advair  inhaler. Please follow up with the patient at [phone] ok to leave a vm.

## 2023-10-02 ENCOUNTER — Other Ambulatory Visit (HOSPITAL_BASED_OUTPATIENT_CLINIC_OR_DEPARTMENT_OTHER): Payer: Self-pay

## 2023-10-03 ENCOUNTER — Other Ambulatory Visit: Payer: Self-pay

## 2023-10-03 ENCOUNTER — Other Ambulatory Visit (HOSPITAL_BASED_OUTPATIENT_CLINIC_OR_DEPARTMENT_OTHER): Payer: Self-pay

## 2023-10-03 MED ORDER — FLUTICASONE FUROATE-VILANTEROL 100-25 MCG/ACT IN AEPB: 1.0000 | INHALATION_SPRAY | Freq: Every day | RESPIRATORY_TRACT | 5 refills | Status: DC

## 2023-10-03 NOTE — Progress Notes (Signed)
 Order placed

## 2023-10-11 ENCOUNTER — Ambulatory Visit (INDEPENDENT_AMBULATORY_CARE_PROVIDER_SITE_OTHER): Admitting: Physician Assistant

## 2023-10-11 VITALS — BP 128/78 | HR 69 | Temp 98.5°F | Resp 16 | Ht 63.0 in | Wt 148.6 lb

## 2023-10-11 DIAGNOSIS — R030 Elevated blood-pressure reading, without diagnosis of hypertension: Secondary | ICD-10-CM | POA: Diagnosis not present

## 2023-10-11 DIAGNOSIS — L989 Disorder of the skin and subcutaneous tissue, unspecified: Secondary | ICD-10-CM | POA: Diagnosis not present

## 2023-10-11 MED ORDER — MUPIROCIN 2 % EX OINT: 1.0000 | TOPICAL_OINTMENT | Freq: Three times a day (TID) | CUTANEOUS | 3 refills | Status: AC

## 2023-10-11 NOTE — Progress Notes (Unsigned)
 Established patient visit   Patient: [name]   DOB: 05/05/1941   83 y.o. Female  MRN: [id number] Visit Date: 10/11/2023  Today's healthcare provider: Trenton Frock, PA-C   No chief complaint on file.  Subjective     HTN managed with nothing  128/78  Medications: Outpatient Medications Prior to Visit  Medication Sig   Accu-Chek Softclix Lancets lancets USE TO TEST BLOOD SUGAR TWICE DAILY AS DIRECTED   albuterol  (PROVENTIL ) (2.5 MG/3ML) 0.083% nebulizer solution Take 3 mLs (2.5 mg total) by nebulization every 6 (six) hours as needed for wheezing or shortness of breath.   albuterol  (VENTOLIN  HFA) 108 (90 Base) MCG/ACT inhaler USE 2 INHALATIONS BY MOUTH  EVERY 4 TO 6 HOURS AS  NEEDED   albuterol  (VENTOLIN  HFA) 108 (90 Base) MCG/ACT inhaler Inhale 1-2 puffs into the lungs every 6 (six) hours as needed for wheezing or shortness of breath.   alendronate  (FOSAMAX ) 70 MG tablet TAKE 1 TABLET(70 MG) BY MOUTH 1 TIME A WEEK   aspirin  EC 81 MG tablet Take 81 mg by mouth daily. (Patient not taking: Reported on 09/26/2023)   azelastine  (ASTELIN ) 0.1 % nasal spray Use 1-2 puffs in each nostril once or twice daily (Patient not taking: Reported on 09/26/2023)   blood glucose meter kit and supplies KIT Dispense based on patient and insurance preference. Use up to four times daily as directed.   Calcium Citrate-Vitamin D  (CALCIUM + D PO) Take 600 mg by mouth in the morning and at bedtime.   celecoxib  (CELEBREX ) 200 MG capsule TAKE 1 CAPSULE(200 MG) BY MOUTH DAILY   cetirizine (ZYRTEC) 10 MG tablet Take 10 mg by mouth daily as needed for allergies.   cholecalciferol  (VITAMIN D3) 25 MCG (1000 UNIT) tablet Take 1,000 Units by mouth daily.   diclofenac Sodium (VOLTAREN) 1 % GEL Apply topically daily as needed (Pain).   doxycycline  (VIBRAMYCIN ) 100 MG capsule Take 1 capsule (100 mg total) by mouth 2 (two) times daily. (Patient not taking: Reported on 09/26/2023)   ezetimibe  (ZETIA ) 10 MG tablet Take  1 tablet (10 mg total) by mouth daily.   fenofibrate  160 MG tablet TAKE 1 TABLET(160 MG) BY MOUTH DAILY   FEROSUL 325 (65 Fe) MG tablet TAKE 1 TABLET(325 MG) BY MOUTH DAILY WITH BREAKFAST   fluticasone  furoate-vilanterol (BREO ELLIPTA ) 100-25 MCG/ACT AEPB Inhale 1 puff into the lungs daily.   glucose blood test strip Use to check sugars twice daily   JARDIANCE  10 MG TABS tablet TAKE 1 TABLET(10 MG) BY MOUTH DAILY BEFORE BREAKFAST   Lidocaine  4 % PTCH Apply 1 patch topically as needed (back pain).   metFORMIN  (GLUCOPHAGE -XR) 500 MG 24 hr tablet Take 1 tablet with morning meal, 1 tablet with lunch and 2 tablets with evening meal.   methocarbamol  (ROBAXIN ) 500 MG tablet TAKE 1 TABLET(500 MG) BY MOUTH EVERY 6 HOURS AS NEEDED FOR MUSCLE SPASMS   mirabegron  ER (MYRBETRIQ ) 50 MG TB24 tablet TAKE 1 TABLET(50 MG) BY MOUTH DAILY   Multiple Vitamin (MULTIVITAMIN WITH MINERALS) TABS tablet Take 1 tablet by mouth daily. Centrum One a day - 50+   Multiple Vitamins-Minerals (PRESERVISION AREDS 2+MULTI VIT PO) Take 1 tablet by mouth in the morning and at bedtime.   mupirocin  ointment (BACTROBAN ) 2 % Apply 1 application topically 3 (three) times daily.   Polyethyl Glycol-Propyl Glycol (SYSTANE OP) Place 1 drop into both eyes daily as needed (dry eyes).   polyethylene glycol (MIRALAX  / GLYCOLAX ) packet Take  17 g by mouth daily as needed for mild constipation.    predniSONE  (DELTASONE ) 10 MG tablet TAKE 3 TABLETS PO QD FOR 3 DAYS THEN TAKE 2 TABLETS PO QD FOR 3 DAYS THEN TAKE 1 TABLET PO QD FOR 3 DAYS THEN TAKE 1/2 TAB PO QD FOR 3 DAYS (Patient not taking: Reported on 09/26/2023)   pregabalin  (LYRICA ) 100 MG capsule TAKE 1 CAPSULE BY MOUTH THREE TIMES DAILY   RSV vaccine recomb adjuvanted (AREXVY ) 120 MCG/0.5ML injection Inject into the muscle. (Patient not taking: Reported on 09/26/2023)   traMADol  (ULTRAM ) 50 MG tablet Take 1 tablet by mouth as needed. (Patient not taking: Reported on 09/26/2023)   Trolamine  Salicylate (ASPERCREME EX) Apply 1 application topically daily as needed (back pain).   VASCEPA  1 g capsule TAKE 2 CAPSULES(2 GRAMS) BY MOUTH TWICE DAILY   No facility-administered medications prior to visit.    Review of Systems {Insert previous labs (optional):23779} {See past labs  Heme  Chem  Endocrine  Serology  Results Review (optional):1}   Objective    There were no vitals taken for this visit. {Insert last BP/Wt (optional):23777}{See vitals history (optional):1}  Physical Exam Vitals reviewed.  Constitutional:      Appearance: She is not ill-appearing.  HENT:     Head: Normocephalic.  Eyes:     Conjunctiva/sclera: Conjunctivae normal.  Cardiovascular:     Rate and Rhythm: Normal rate.  Pulmonary:     Effort: Pulmonary effort is normal. No respiratory distress.  Neurological:     Mental Status: She is alert and oriented to person, place, and time.  Psychiatric:        Mood and Affect: Mood normal.        Behavior: Behavior normal.     ***  No results found for any visits on 10/11/23.  Assessment & Plan    There are no diagnoses linked to this encounter.  ***  No follow-ups on file.       Trenton Frock, PA-C  [facility] at [facility] [phone] (phone) [phone] (fax)  [facility]

## 2023-10-12 ENCOUNTER — Encounter: Payer: Self-pay | Admitting: Physician Assistant

## 2023-10-12 ENCOUNTER — Other Ambulatory Visit: Payer: Self-pay | Admitting: Internal Medicine

## 2023-10-17 ENCOUNTER — Telehealth: Payer: Self-pay | Admitting: Family Medicine

## 2023-10-17 NOTE — Telephone Encounter (Signed)
 Copied from CRM [id number]. Topic: Medicare AWV >> Oct 17, 2023 10:01 AM Juliana Ocean wrote: Reason for CRM: LVM 10/17/2023 to schedule AWV. Please schedule Virtual or Telehealth visits ONLY  Rosalee Collins; Care Guide Ambulatory Clinical Support [facility] [address] [facility] [address] [facility] Direct Dial: [phone]

## 2023-10-26 ENCOUNTER — Ambulatory Visit: Admitting: Family Medicine

## 2023-10-27 ENCOUNTER — Ambulatory Visit (HOSPITAL_COMMUNITY)
Admission: RE | Admit: 2023-10-27 | Discharge: 2023-10-27 | Disposition: A | Discharge status: Home / Self Care | Source: Ambulatory Visit | Attending: Cardiology | Admitting: Cardiology

## 2023-10-27 DIAGNOSIS — I358 Other nonrheumatic aortic valve disorders: Secondary | ICD-10-CM | POA: Diagnosis not present

## 2023-10-27 LAB — ECHOCARDIOGRAM COMPLETE
AR max vel: 0.9 cm2
AV Area VTI: 0.86 cm2
AV Area mean vel: 0.92 cm2
AV Mean grad: 13 mmHg
AV Peak grad: 23.3 mmHg
Ao pk vel: 2.42 m/s
Area-P 1/2: 3.02 cm2
S' Lateral: 3 cm

## 2023-10-30 ENCOUNTER — Ambulatory Visit (INDEPENDENT_AMBULATORY_CARE_PROVIDER_SITE_OTHER): Admitting: Family Medicine

## 2023-10-30 ENCOUNTER — Encounter: Payer: Self-pay | Admitting: Family Medicine

## 2023-10-30 VITALS — BP 122/44 | HR 64 | Temp 98.4°F | Resp 16 | Ht 63.0 in | Wt 154.0 lb

## 2023-10-30 DIAGNOSIS — E1169 Type 2 diabetes mellitus with other specified complication: Secondary | ICD-10-CM | POA: Diagnosis not present

## 2023-10-30 DIAGNOSIS — I10 Essential (primary) hypertension: Secondary | ICD-10-CM | POA: Diagnosis not present

## 2023-10-30 DIAGNOSIS — N898 Other specified noninflammatory disorders of vagina: Secondary | ICD-10-CM | POA: Insufficient documentation

## 2023-10-30 DIAGNOSIS — E1165 Type 2 diabetes mellitus with hyperglycemia: Secondary | ICD-10-CM | POA: Diagnosis not present

## 2023-10-30 DIAGNOSIS — B354 Tinea corporis: Secondary | ICD-10-CM | POA: Insufficient documentation

## 2023-10-30 DIAGNOSIS — E785 Hyperlipidemia, unspecified: Secondary | ICD-10-CM

## 2023-10-30 LAB — POC URINALSYSI DIPSTICK (AUTOMATED)
Bilirubin, UA: NEGATIVE
Glucose, UA: NEGATIVE
Ketones, UA: NEGATIVE
Nitrite, UA: NEGATIVE
Protein, UA: POSITIVE — AB
Spec Grav, UA: 1.01 (ref 1.010–1.025)
Urobilinogen, UA: 0.2 U/dL
pH, UA: 6 (ref 5.0–8.0)

## 2023-10-30 MED ORDER — CLOTRIMAZOLE-BETAMETHASONE 1-0.05 % EX CREA: 1.0000 | TOPICAL_CREAM | Freq: Every day | CUTANEOUS | 0 refills | Status: AC

## 2023-10-30 MED ORDER — FLUCONAZOLE 150 MG PO TABS: 150.0000 mg | ORAL_TABLET | Freq: Every day | ORAL | 0 refills | Status: DC

## 2023-10-30 MED ORDER — TRAMADOL HCL 50 MG PO TABS: 50.0000 mg | ORAL_TABLET | Freq: Four times a day (QID) | ORAL | 0 refills | Status: DC | PRN

## 2023-10-30 NOTE — Assessment & Plan Note (Signed)
 Well controlled, no changes to meds. Encouraged heart healthy diet such as the DASH diet and exercise as tolerated.
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
## 2023-10-30 NOTE — Assessment & Plan Note (Signed)
 Encourage heart healthy diet such as MIND or DASH diet, increase exercise, avoid trans fats, simple carbohydrates and processed foods, consider a krill or fish or flaxseed oil cap daily.

## 2023-10-30 NOTE — Assessment & Plan Note (Signed)
Check labs  Hgba1c to be checked , minimize simple carbs. Increase exercise as tolerated. Continue current meds

## 2023-10-31 LAB — COMPREHENSIVE METABOLIC PANEL WITH GFR
ALT: 20 U/L (ref 0–35)
AST: 21 U/L (ref 0–37)
Albumin: 4.1 g/dL (ref 3.5–5.2)
Alkaline Phosphatase: 34 U/L — ABNORMAL LOW (ref 39–117)
BUN: 23 mg/dL (ref 6–23)
CO2: 26 meq/L (ref 19–32)
Calcium: 9.7 mg/dL (ref 8.4–10.5)
Chloride: 107 meq/L (ref 96–112)
Creatinine, Ser: 1.33 mg/dL — ABNORMAL HIGH (ref 0.40–1.20)
GFR: 37.2 mL/min — ABNORMAL LOW (ref >60.00)
Glucose, Bld: 137 mg/dL — ABNORMAL HIGH (ref 70–99)
Potassium: 4.4 meq/L (ref 3.5–5.1)
Sodium: 142 meq/L (ref 135–145)
Total Bilirubin: 0.3 mg/dL (ref 0.2–1.2)
Total Protein: 6 g/dL (ref 6.0–8.3)

## 2023-10-31 LAB — URINE CULTURE
MICRO NUMBER:: 16526568
SPECIMEN QUALITY:: ADEQUATE

## 2023-10-31 LAB — LIPID PANEL
Cholesterol: 143 mg/dL (ref 0–200)
HDL: 49.1 mg/dL (ref >39.00)
LDL Cholesterol: 53 mg/dL (ref 0–99)
NonHDL: 93.62
Total CHOL/HDL Ratio: 3
Triglycerides: 204 mg/dL — ABNORMAL HIGH (ref 0.0–149.0)
VLDL: 40.8 mg/dL — ABNORMAL HIGH (ref 0.0–40.0)

## 2023-10-31 LAB — HEMOGLOBIN A1C: Hgb A1c MFr Bld: 6.9 % — ABNORMAL HIGH (ref 4.6–6.5)

## 2023-11-01 ENCOUNTER — Ambulatory Visit: Payer: Self-pay | Admitting: Family Medicine

## 2023-11-01 LAB — NUSWAB VAGINITIS PLUS (VG+)
Candida albicans, NAA: NEGATIVE
Candida glabrata, NAA: NEGATIVE
Chlamydia trachomatis, NAA: NEGATIVE
Neisseria gonorrhoeae, NAA: NEGATIVE
Trich vag by NAA: NEGATIVE

## 2023-11-03 ENCOUNTER — Ambulatory Visit: Payer: Self-pay | Admitting: Internal Medicine

## 2023-11-03 DIAGNOSIS — I358 Other nonrheumatic aortic valve disorders: Secondary | ICD-10-CM

## 2023-11-03 DIAGNOSIS — I350 Nonrheumatic aortic (valve) stenosis: Secondary | ICD-10-CM

## 2023-11-21 ENCOUNTER — Other Ambulatory Visit: Payer: Self-pay | Admitting: Family Medicine

## 2023-11-21 DIAGNOSIS — D509 Iron deficiency anemia, unspecified: Secondary | ICD-10-CM

## 2023-11-21 DIAGNOSIS — G8929 Other chronic pain: Secondary | ICD-10-CM

## 2023-11-25 NOTE — Progress Notes (Unsigned)
 HPI female never smoker followed for allergic rhinitis, asthma, complicated by history embolic CVA complicating C-spine surgery, DM 2  ---------------------------------------------------------------------   05/26/22- 83 year old female never smoker followed for allergic Rhinitis, Asthma, complicated by history embolic CVA complicating C-spine surgery, DM 2, hx DVT/PE 2006, HTN, Covid infection 01/15/22,  Follows for: asthma, allergic rhinitis  -Ventolin  hfa, Dymista , Advair  250,  Covid vax- 4 Moderna Flu vax-had Covid infection 8/19> Paxlovid  renal dosing ACT score-24    Pt is doing well She thinks Paxlovid  helped her Covid infection and she is back to baseline. Prednisone  for asthma then made her hyper. She got the neb medicine but has not yet gotten the compressor. We will notify [employer]. CXR 01/15/22- IMPRESSION: No active cardiopulmonary disease.  05/29/23-  83 year old female never smoker followed for allergic Rhinitis, Asthma, complicated by history embolic CVA complicating C-spine surgery, DM 2, hx DVT/PE 2006, HTN, Covid infection 01/15/22,  Follows for: asthma, allergic rhinitis  -Ventolin  hfa, Dymista , Neb Albuterol , Discussed the use of AI scribe software for clinical note transcription with the patient, who gave verbal consent to proceed.  History of Present Illness   The patient, with a history of asthma, presents for a routine follow-up. She reports that her insurance will no longer cover Advair , her current asthma medication. She has three remaining inhalers and is considering switching to Symbicort, as suggested by her insurance. She has previously tried another medication (referred to as Georgeana'), which caused a persistent cough and a sensation of not getting enough air. She is open to trying Symbicort but would like to wait until the new year due to insurance costs. She also requests a refill of her emergency albuterol  inhaler, noting that her previous inhalers have lasted  longer than the listed expiration date.  The patient also has a history of allergic rhinitis, for which she previously used a Dymista  inhaler. She has since found an over-the-counter medication that works as well, in combination with Allegra. She also used to take allergy  shots, but has discontinued them after 20 years.  The patient reports no recent illnesses or infections, including COVID-19. She has received five COVID-19 vaccinations and is up to date with her flu shot.    Assessment and Plan:    Asthma Patient currently on Advair , but insurance will no longer cover it. Patient has three months supply left. Discussed switching to Symbicort, which patient agreed to. Patient also uses an albuterol  inhaler for emergencies, which is still effective despite being past its listed expiration date. -Plan to switch to Symbicort in March when Advair  runs out. -Continue use of albuterol  inhaler as needed for emergencies.  Allergic Rhinitis Patient previously used Dymista , but has found an over-the-counter alternative that works well in combination with Allegra. Patient also used to take allergy  shots, but has stopped after 20 years. -Continue current regimen of over-the-counter nasal spray and Allegra.  General Health Maintenance Patient has received five COVID-19 vaccinations and is up-to-date with flu vaccination. -No further action needed at this time.      11/27/23-  83 year old female never smoker followed for allergic Rhinitis, Asthma, complicated by history embolic CVA complicating C-spine surgery, DM 2, hx DVT/PE 2006, HTN, Covid infection 01/15/22,  Follows for: asthma, allergic rhinitis  -Ventolin  hfa, Dymista , Neb Albuterol , Breo100,  ED 3/28 for cough, rhinitis> doxycycline  and a Duoneb neb ttreatment.  CXR 08/25/23 IMPRESSION: Patchy left basilar opacities, which may represent atelectasis or pneumonia.  ROV-see HPI + = positive Constitutional:   No-   weight  loss, night sweats,  fevers, chills, fatigue, lassitude. HEENT:   No-  headaches, difficulty swallowing, tooth/dental problems, sore throat,       No- sneezing, itching, ear ache, +nasal congestion, post nasal drip,  CV:  chest pain, no-orthopnea, PND, swelling in lower extremities, anasarca, dizziness, palpitations Resp: No-   shortness of breath with exertion or at rest.              No-   productive cough,  No non-productive cough,  No- coughing up of blood.              No-   change in color of mucus.  + Wheezing.   Skin: No-   rash or lesions. GI:  GU:  MS:  No-   joint pain or swelling.  No- decreased range of motion.  Back pain after surgery Neuro-     nothing unusual Psych:  No- change in mood or affect. No depression or anxiety.  No memory loss.  OBJ General- Alert, Oriented, Affect-appropriate, Distress- none acute, looks very well Skin- rash-none, lesions- none, excoriation- none Lymphadenopathy- none Head- atraumatic            Eyes- Gross vision intact, PERRLA, conjunctivae clear secretions            Ears- Hearing, canals-normal            Nose- Clear, no-Septal dev, mucus, polyps, erosion, perforation             Throat- Mallampati II , mucosa clear , drainage- none, tonsils- atrophic, +mild hoarseness Neck- flexible , trachea midline, no stridor , thyroid  nl, carotid no bruit Chest - symmetrical excursion , unlabored           Heart/CV- RRR , no murmur , no gallop  , no rub, nl s1 s2                           - JVD- none , edema- none, stasis changes- none, varices- none           Lung- Clear,  Wheeze- none, cough-none , dullness-none, rub- none           Chest wall-  Abd-  Br/ Gen/ Rectal- Not done, not indicated Extrem- cyanosis- none, clubbing, none, atrophy- none, strength- nl, + cane Neuro- grossly intact to observation

## 2023-11-27 ENCOUNTER — Ambulatory Visit: Payer: Medicare Other | Admitting: Internal Medicine

## 2023-11-27 ENCOUNTER — Ambulatory Visit (INDEPENDENT_AMBULATORY_CARE_PROVIDER_SITE_OTHER)

## 2023-11-27 ENCOUNTER — Encounter: Payer: Self-pay | Admitting: Internal Medicine

## 2023-11-27 VITALS — BP 124/64 | HR 60 | Temp 98.0°F | Ht 63.5 in | Wt 155.6 lb

## 2023-11-27 DIAGNOSIS — J4540 Moderate persistent asthma, uncomplicated: Secondary | ICD-10-CM | POA: Diagnosis not present

## 2023-11-27 DIAGNOSIS — J45909 Unspecified asthma, uncomplicated: Secondary | ICD-10-CM | POA: Diagnosis not present

## 2023-11-27 DIAGNOSIS — Z981 Arthrodesis status: Secondary | ICD-10-CM | POA: Diagnosis not present

## 2023-11-27 NOTE — Patient Instructions (Signed)
 I'm glad you are doing well. Please let us  know if we can help.  Order- CXR   dx asthma moderate persistent uncompicated

## 2023-11-28 ENCOUNTER — Ambulatory Visit: Payer: Self-pay | Admitting: Internal Medicine

## 2023-11-28 DIAGNOSIS — M48062 Spinal stenosis, lumbar region with neurogenic claudication: Secondary | ICD-10-CM | POA: Diagnosis not present

## 2023-11-28 NOTE — Progress Notes (Signed)
 Pt notified on AM (DPR)

## 2023-12-25 ENCOUNTER — Encounter: Payer: Self-pay | Admitting: Family Medicine

## 2023-12-25 ENCOUNTER — Ambulatory Visit (INDEPENDENT_AMBULATORY_CARE_PROVIDER_SITE_OTHER): Admitting: Family Medicine

## 2023-12-25 VITALS — BP 118/68 | HR 59 | Temp 97.8°F | Resp 16 | Ht 63.5 in | Wt 155.4 lb

## 2023-12-25 DIAGNOSIS — Z7984 Long term (current) use of oral hypoglycemic drugs: Secondary | ICD-10-CM | POA: Diagnosis not present

## 2023-12-25 DIAGNOSIS — R030 Elevated blood-pressure reading, without diagnosis of hypertension: Secondary | ICD-10-CM | POA: Diagnosis not present

## 2023-12-25 DIAGNOSIS — E1169 Type 2 diabetes mellitus with other specified complication: Secondary | ICD-10-CM

## 2023-12-25 DIAGNOSIS — E1165 Type 2 diabetes mellitus with hyperglycemia: Secondary | ICD-10-CM | POA: Diagnosis not present

## 2023-12-25 DIAGNOSIS — E785 Hyperlipidemia, unspecified: Secondary | ICD-10-CM | POA: Diagnosis not present

## 2023-12-25 LAB — COMPREHENSIVE METABOLIC PANEL WITH GFR
ALT: 20 U/L (ref 0–35)
AST: 19 U/L (ref 0–37)
Albumin: 4.2 g/dL (ref 3.5–5.2)
Alkaline Phosphatase: 38 U/L — ABNORMAL LOW (ref 39–117)
BUN: 21 mg/dL (ref 6–23)
CO2: 28 meq/L (ref 19–32)
Calcium: 10.5 mg/dL (ref 8.4–10.5)
Chloride: 104 meq/L (ref 96–112)
Creatinine, Ser: 1.15 mg/dL (ref 0.40–1.20)
GFR: 44.24 mL/min — ABNORMAL LOW (ref >60.00)
Glucose, Bld: 185 mg/dL — ABNORMAL HIGH (ref 70–99)
Potassium: 4.6 meq/L (ref 3.5–5.1)
Sodium: 140 meq/L (ref 135–145)
Total Bilirubin: 0.4 mg/dL (ref 0.2–1.2)
Total Protein: 6.3 g/dL (ref 6.0–8.3)

## 2023-12-25 LAB — HEMOGLOBIN A1C: Hgb A1c MFr Bld: 7.7 % — ABNORMAL HIGH (ref 4.6–6.5)

## 2023-12-25 NOTE — Assessment & Plan Note (Signed)
 hgba1c acceptable, minimize simple carbs. Increase exercise as tolerated. Continue current meds

## 2023-12-25 NOTE — Assessment & Plan Note (Signed)
 Encourage heart healthy diet such as MIND or DASH diet, increase exercise, avoid trans fats, simple carbohydrates and processed foods, consider a krill or fish or flaxseed oil cap daily.

## 2023-12-25 NOTE — Progress Notes (Signed)
 Subjective:    Patient ID: [name], female    DOB: Jul 12, 1940, 83 y.o.   MRN: [id number]  Chief Complaint  Patient presents with   Diabetes    Here for follow up    HPI Patient is in today for f/u dm and cholesterol.  Discussed the use of AI scribe software for clinical note transcription with the patient, who gave verbal consent to proceed.  History of Present Illness [name] is an 83 year old female with diabetes who presents for evaluation of kidney function and blood sugar control.  She reports that her nurse told her she has experienced a recent change in kidney function. She previously had an allergic reaction to Jardiance , which caused her urine to become 'real yellow, like golden,' but her urine color has since returned to normal. She is currently taking metformin  twice a day.  She reports her blood sugar readings have been around 118 mg/dL. She mentions a recent dietary lapse while visiting her twin sister, where she ate out three times, but overall she has been trying to maintain a healthy diet. She has experienced some weight fluctuation, noting a recent weight of 155 pounds on the clinic scale, compared to 153 pounds on her home scale.  She reports a history of elevated blood pressure readings in medical settings, attributing it to the tightness of the blood pressure cuff. A recent reading was 152 mmHg, but her blood pressure was normal when rechecked. She does not take blood pressure medication.  She experiences constant pain in her back, which was temporarily relieved by an injection prior to a recent trip. She has an upcoming appointment to address this issue further. She is not currently on pain medication.  Her twin sister recently had sepsis following surgery and is undergoing rehabilitation. This has impacted her emotionally, as she reflects on her own health and aging. No depression, although she was initially affected by her sister's  condition.    Past Medical History:  Diagnosis Date   Anemia    Arthritis    Asthma    adult onset   Basal cell cancer    LUE; Porokeratosis also   Chronic kidney disease 1963   strep in kidney due to strep throat-hospitalized 10 days   Complication of anesthesia    small trachea   Diabetes mellitus 2010   A1c 6.7%   DVT (deep venous thrombosis) (HCC) 2006   post immobilization post cns surgery   GERD (gastroesophageal reflux disease)    very mild   Granulomatous lung disease (HCC) 2002   incidental Xray finding   Hyperlipidemia    Hypertension 2004   Hypertensive response on Stress Test   Paralysis (HCC) 2006   post cervical fusion with spinal sac tear  with hematoma    PTE (pulmonary thromboembolism) (HCC) 2006    Past Surgical History:  Procedure Laterality Date   ABDOMINAL EXPOSURE N/A 08/12/2016   Procedure: ABDOMINAL EXPOSURE;  Surgeon: Krystal JULIANNA Doing, MD;  Location: [facility] OR;  Service: Vascular;  Laterality: N/A;   ANTERIOR LAT LUMBAR FUSION N/A 08/12/2016   Procedure: LUMBAR TWO-THREE, LUMBAR THREE-FOUR, LUMBAR FOUR-FIVE  ANTEROLATERAL LUMBAR INTERBODY FUSION;  Surgeon: Fairy Levels, MD;  Location[facility] OR;  Service: Neurosurgery;  Laterality: N/A;  L2-3 L3-4 L4-5 Anterolateral lumbar interbody fusion   ANTERIOR LUMBAR FUSION N/A 08/12/2016   Procedure: Lumbar Five-Sacral One Anterior lumbar interbody fusion with Dr. Krystal Early to assist;  Surgeon: Fairy Levels, MD;  Location: [facility] OR;  Service: Neurosurgery;  Laterality: N/A;  L5-S1 Anterior lumbar interbody fusion with Dr. Krystal Early to assist   BUNIONECTOMY     CATARACT EXTRACTION Right 12/31/2018   CATARACT EXTRACTION Left 12/17/2018   CERVICAL FUSION  2006   Dr Louis, [place];post op hematoma & cns leak & urinary retention   COLONOSCOPY  1992 & 2002   negative   epidural steroids  2006   cervical spine   LUMBAR PERCUTANEOUS PEDICLE SCREW 4 LEVEL N/A 08/12/2016   Procedure: LUMBAR TWO-SACRAL ONE Percuataneous Pedicle  Screws;  Surgeon: Fairy Levels, MD;  Location: [facility] OR;  Service: Neurosurgery;  Laterality: N/A;   ROTATOR CUFF REPAIR  2009   Right   SEPTOPLASTY     TONSILLECTOMY  83 years old   TOTAL HIP ARTHROPLASTY  06/01/2012   Procedure: TOTAL HIP ARTHROPLASTY ANTERIOR APPROACH;  Surgeon: Lonni CINDERELLA Poli, MD;  Location: [facility] ORS;  Service: Orthopedics;  Laterality: Left;  Left Total Hip Arthroplasty   TOTAL HIP ARTHROPLASTY Right 02/23/2018   Procedure: RIGHT TOTAL HIP ARTHROPLASTY ANTERIOR APPROACH;  Surgeon: Poli Lonni CINDERELLA, MD;  Location: [facility] ORS;  Service: Orthopedics;  Laterality: Right;   TUBAL LIGATION      Family History  Problem Relation Age of Onset   COPD Father        emphysema   Cancer Mother        cns cancer   Diabetes Sister        TWIN sister ; also Fibromyalgia ; S/P stent 2004   Heart disease Sister        stents @ 3 & 63   Stroke Maternal Grandmother        in  late 80s   Transient ischemic attack Paternal Aunt     Social History   Socioeconomic History   Marital status: Divorced    Spouse name: Not on file   Number of children: Not on file   Years of education: Not on file   Highest education level: Not on file  Occupational History   Not on file  Tobacco Use   Smoking status: Never    Passive exposure: Past   Smokeless tobacco: Never  Vaping Use   Vaping status: Never Used  Substance and Sexual Activity   Alcohol use: Yes    Alcohol/week: 1.0 standard drink of alcohol    Types: 1 Glasses of wine per week    Comment: socially   Drug use: No   Sexual activity: Never  Other Topics Concern   Not on file  Social History Narrative   Not on file   Social Drivers of Health   Financial Resource Strain: Low Risk  (10/29/2021)   Overall Financial Resource Strain (CARDIA)    Difficulty of Paying Living Expenses: Not hard at all  Food Insecurity: No Food Insecurity (07/14/2022)   Hunger Vital Sign    Worried About Running Out of Food in the Last  Year: Never true    Ran Out of Food in the Last Year: Never true  Transportation Needs: No Transportation Needs (07/14/2022)   PRAPARE - Administrator, Civil Service (Medical): No    Lack of Transportation (Non-Medical): No  Physical Activity: Insufficiently Active (07/14/2022)   Exercise Vital Sign    Days of Exercise per Week: 3 days    Minutes of Exercise per Session: 40 min  Stress: No Stress Concern Present (07/09/2021)   [employer] of  Occupational Health - Occupational Stress Questionnaire    Feeling of Stress : Not at all  Social Connections: Moderately Isolated (07/01/2022)   Social Connection and Isolation Panel    Frequency of Communication with Friends and Family: More than three times a week    Frequency of Social Gatherings with Friends and Family: More than three times a week    Attends Religious Services: Never    Database administrator or Organizations: Yes    Attends Engineer, structural: More than 4 times per year    Marital Status: Divorced  Intimate Partner Violence: Not At Risk (07/14/2022)   Humiliation, Afraid, Rape, and Kick questionnaire    Fear of Current or Ex-Partner: No    Emotionally Abused: No    Physically Abused: No    Sexually Abused: No    Outpatient Medications Prior to Visit  Medication Sig Dispense Refill   Accu-Chek Softclix Lancets lancets USE TO TEST BLOOD SUGAR TWICE DAILY AS DIRECTED 100 each 12   albuterol  (PROVENTIL ) (2.5 MG/3ML) 0.083% nebulizer solution Take 3 mLs (2.5 mg total) by nebulization every 6 (six) hours as needed for wheezing or shortness of breath. 75 mL 12   albuterol  (VENTOLIN  HFA) 108 (90 Base) MCG/ACT inhaler USE 2 INHALATIONS BY MOUTH  EVERY 4 TO 6 HOURS AS  NEEDED 51 g 3   albuterol  (VENTOLIN  HFA) 108 (90 Base) MCG/ACT inhaler Inhale 1-2 puffs into the lungs every 6 (six) hours as needed for wheezing or shortness of breath. 1 each 0   alendronate  (FOSAMAX ) 70 MG tablet TAKE 1 TABLET(70 MG) BY  MOUTH 1 TIME A WEEK 12 tablet 3   aspirin  EC 81 MG tablet Take 81 mg by mouth daily.     azelastine  (ASTELIN ) 0.1 % nasal spray Use 1-2 puffs in each nostril once or twice daily 30 mL 12   blood glucose meter kit and supplies KIT Dispense based on patient and insurance preference. Use up to four times daily as directed. 1 each 0   Calcium Citrate-Vitamin D  (CALCIUM + D PO) Take 600 mg by mouth in the morning and at bedtime.     celecoxib  (CELEBREX ) 200 MG capsule TAKE 1 CAPSULE(200 MG) BY MOUTH DAILY 90 capsule 3   cetirizine (ZYRTEC) 10 MG tablet Take 10 mg by mouth daily as needed for allergies.     cholecalciferol  (VITAMIN D3) 25 MCG (1000 UNIT) tablet Take 1,000 Units by mouth daily.     clotrimazole -betamethasone  (LOTRISONE ) cream Apply 1 Application topically daily. 30 g 0   cyclobenzaprine  (FLEXERIL ) 10 MG tablet TAKE 1 TABLET BY MOUTH THREE TIMES DAILY AS NEEDED FOR HIP CONCERNS 30 tablet 2   diclofenac Sodium (VOLTAREN) 1 % GEL Apply topically daily as needed (Pain).     ezetimibe  (ZETIA ) 10 MG tablet Take 1 tablet (10 mg total) by mouth daily. 90 tablet 1   fenofibrate  160 MG tablet TAKE 1 TABLET(160 MG) BY MOUTH DAILY 90 tablet 0   FEROSUL 325 (65 Fe) MG tablet TAKE 1 TABLET(325 MG) BY MOUTH DAILY WITH BREAKFAST 90 tablet 1   fluconazole  (DIFLUCAN ) 150 MG tablet Take 1 tablet (150 mg total) by mouth daily. May repeat in 3 days if needed. 2 tablet 0   glucose blood test strip Use to check sugars twice daily 100 each 12   Lidocaine  4 % PTCH Apply 1 patch topically as needed (back pain).     metFORMIN  (GLUCOPHAGE -XR) 500 MG 24 hr tablet Take 1 tablet with  morning meal, 1 tablet with lunch and 2 tablets with evening meal. 360 tablet 3   methocarbamol  (ROBAXIN ) 500 MG tablet TAKE 1 TABLET(500 MG) BY MOUTH EVERY 6 HOURS AS NEEDED FOR MUSCLE SPASMS 45 tablet 1   mirabegron  ER (MYRBETRIQ ) 50 MG TB24 tablet TAKE 1 TABLET(50 MG) BY MOUTH DAILY 90 tablet 3   Multiple Vitamin (MULTIVITAMIN WITH  MINERALS) TABS tablet Take 1 tablet by mouth daily. Centrum One a day - 50+     Multiple Vitamins-Minerals (PRESERVISION AREDS 2+MULTI VIT PO) Take 1 tablet by mouth in the morning and at bedtime.     mupirocin  ointment (BACTROBAN ) 2 % Apply 1 Application topically 3 (three) times daily. 30 g 3   Polyethyl Glycol-Propyl Glycol (SYSTANE OP) Place 1 drop into both eyes daily as needed (dry eyes).     polyethylene glycol (MIRALAX  / GLYCOLAX ) packet Take 17 g by mouth daily as needed for mild constipation.      pregabalin  (LYRICA ) 100 MG capsule TAKE 1 CAPSULE BY MOUTH THREE TIMES DAILY 270 capsule 1   traMADol  (ULTRAM ) 50 MG tablet Take 1 tablet (50 mg total) by mouth every 6 (six) hours as needed. 30 tablet 0   Trolamine Salicylate (ASPERCREME EX) Apply 1 application topically daily as needed (back pain).     VASCEPA  1 g capsule TAKE 2 CAPSULES(2 GRAMS) BY MOUTH TWICE DAILY 360 capsule 3   fluticasone  furoate-vilanterol (BREO ELLIPTA ) 100-25 MCG/ACT AEPB Inhale 1 puff into the lungs daily. 60 each 5   predniSONE  (DELTASONE ) 10 MG tablet TAKE 3 TABLETS PO QD FOR 3 DAYS THEN TAKE 2 TABLETS PO QD FOR 3 DAYS THEN TAKE 1 TABLET PO QD FOR 3 DAYS THEN TAKE 1/2 TAB PO QD FOR 3 DAYS 20 tablet 0   RSV vaccine recomb adjuvanted (AREXVY ) 120 MCG/0.5ML injection Inject into the muscle. 0.5 mL 0   No facility-administered medications prior to visit.    Allergies  Allergen Reactions   Statins Other (See Comments)    Myalgias and muscle weakness   Jardiance  [Empagliflozin ] Rash    Review of Systems  Constitutional:  Negative for chills, fever and malaise/fatigue.  HENT:  Negative for congestion and hearing loss.   Eyes:  Negative for blurred vision and discharge.  Respiratory:  Negative for cough, sputum production and shortness of breath.   Cardiovascular:  Negative for chest pain, palpitations and leg swelling.  Gastrointestinal:  Negative for abdominal pain, blood in stool, constipation, diarrhea,  heartburn, nausea and vomiting.  Genitourinary:  Negative for dysuria, frequency, hematuria and urgency.  Musculoskeletal:  Negative for back pain, falls and myalgias.  Skin:  Negative for rash.  Neurological:  Negative for dizziness, sensory change, loss of consciousness, weakness and headaches.  Endo/Heme/Allergies:  Negative for environmental allergies. Does not bruise/bleed easily.  Psychiatric/Behavioral:  Negative for depression and suicidal ideas. The patient is not nervous/anxious and does not have insomnia.        Objective:    Physical Exam Vitals and nursing note reviewed.  Constitutional:      General: She is not in acute distress.    Appearance: Normal appearance. She is well-developed.  HENT:     Head: Normocephalic and atraumatic.  Eyes:     General: No scleral icterus.       Right eye: No discharge.        Left eye: No discharge.  Cardiovascular:     Rate and Rhythm: Normal rate and regular rhythm.     Heart  sounds: No murmur heard. Pulmonary:     Effort: Pulmonary effort is normal. No respiratory distress.     Breath sounds: Normal breath sounds.  Musculoskeletal:        General: Normal range of motion.     Cervical back: Normal range of motion and neck supple.     Right lower leg: No edema.     Left lower leg: No edema.  Skin:    General: Skin is warm and dry.  Neurological:     Mental Status: She is alert and oriented to person, place, and time.  Psychiatric:        Mood and Affect: Mood normal.        Behavior: Behavior normal.        Thought Content: Thought content normal.        Judgment: Judgment normal.    Diabetic Foot Exam - Simple   Simple Foot Form Diabetic Foot exam was performed with the following findings: Yes 12/25/2023 11:24 AM  Visual Inspection No deformities, no ulcerations, no other skin breakdown bilaterally: Yes Sensation Testing Intact to touch and monofilament testing bilaterally: Yes Pulse Check Posterior Tibialis and  Dorsalis pulse intact bilaterally: Yes Comments      BP 118/68   Pulse (!) 59   Temp 97.8 F (36.6 C) (Oral)   Resp 16   Ht 5' 3.5 (1.613 m)   Wt 155 lb 6.4 oz (70.5 kg)   SpO2 98%   BMI 27.10 kg/m  Wt Readings from Last 3 Encounters:  12/25/23 155 lb 6.4 oz (70.5 kg)  11/27/23 155 lb 9.6 oz (70.6 kg)  10/30/23 154 lb (69.9 kg)   . Diabetic Foot Exam - Simple   Simple Foot Form Diabetic Foot exam was performed with the following findings: Yes 12/25/2023 11:24 AM  Visual Inspection No deformities, no ulcerations, no other skin breakdown bilaterally: Yes Sensation Testing Intact to touch and monofilament testing bilaterally: Yes Pulse Check Posterior Tibialis and Dorsalis pulse intact bilaterally: Yes Comments    Lab Results  Component Value Date   WBC 6.5 08/25/2023   HGB 11.4 (L) 08/25/2023   HCT 35.3 (L) 08/25/2023   PLT 232 08/25/2023   GLUCOSE 137 (H) 10/30/2023   CHOL 143 10/30/2023   TRIG 204.0 (H) 10/30/2023   HDL 49.10 10/30/2023   LDLDIRECT 54 07/15/2021   LDLCALC 53 10/30/2023   ALT 20 10/30/2023   AST 21 10/30/2023   NA 142 10/30/2023   K 4.4 10/30/2023   CL 107 10/30/2023   CREATININE 1.33 (H) 10/30/2023   BUN 23 10/30/2023   CO2 26 10/30/2023   TSH 5.38 (H) 06/03/2016   INR 1.1 01/15/2022   HGBA1C 6.9 (H) 10/30/2023   MICROALBUR 0.3 02/07/2020    Lab Results  Component Value Date   TSH 5.38 (H) 06/03/2016   Lab Results  Component Value Date   WBC 6.5 08/25/2023   HGB 11.4 (L) 08/25/2023   HCT 35.3 (L) 08/25/2023   MCV 87.8 08/25/2023   PLT 232 08/25/2023   Lab Results  Component Value Date   NA 142 10/30/2023   K 4.4 10/30/2023   CO2 26 10/30/2023   GLUCOSE 137 (H) 10/30/2023   BUN 23 10/30/2023   CREATININE 1.33 (H) 10/30/2023   BILITOT 0.3 10/30/2023   ALKPHOS 34 (L) 10/30/2023   AST 21 10/30/2023   ALT 20 10/30/2023   PROT 6.0 10/30/2023   ALBUMIN 4.1 10/30/2023   CALCIUM 9.7 10/30/2023   ANIONGAP  7 08/25/2023    GFR 37.20 (L) 10/30/2023   Lab Results  Component Value Date   CHOL 143 10/30/2023   Lab Results  Component Value Date   HDL 49.10 10/30/2023   Lab Results  Component Value Date   LDLCALC 53 10/30/2023   Lab Results  Component Value Date   TRIG 204.0 (H) 10/30/2023   Lab Results  Component Value Date   CHOLHDL 3 10/30/2023   Lab Results  Component Value Date   HGBA1C 6.9 (H) 10/30/2023       Assessment & Plan:  Uncontrolled type 2 diabetes mellitus with hyperglycemia (HCC) -     Comprehensive metabolic panel with GFR -     Hemoglobin A1c  ELEVATED BLOOD PRESSURE WITHOUT DIAGNOSIS OF HYPERTENSION Assessment & Plan: Repeat was normal    Hyperlipidemia associated with type 2 diabetes mellitus (HCC) Assessment & Plan: Encourage heart healthy diet such as MIND or DASH diet, increase exercise, avoid trans fats, simple carbohydrates and processed foods, consider a krill or fish or flaxseed oil cap daily.     Type 2 diabetes mellitus with hyperglycemia, without long-term current use of insulin  [facility]) Assessment & Plan: hgba1c acceptable, minimize simple carbs. Increase exercise as tolerated. Continue current meds    Assessment and Plan Assessment & Plan Type 2 Diabetes Mellitus   Blood glucose levels are well-managed at 118 mg/dL with metformin  1 x daily.  She is mindful of her diet, though reports some dietary indulgence during a recent visit to her sister. Continue metformin  twice daily, monitor blood glucose levels, and encourage dietary management.  Chronic Kidney Disease   Metformin  may be aiding in kidney function. Monitor kidney function, continue metformin  twice daily.  Hypertension   Reports elevated blood pressure readings in medical settings, likely due to white coat syndrome. She does not take antihypertensive medication and attributes initial high reading to discomfort from the blood pressure machine. Recheck blood pressure manually if initial readings  are high and monitor blood pressure regularly.  Chronic Pain   Experiences chronic back pain, temporarily relieved by an injection prior to a trip. Pain has returned, and she has an upcoming appointment to address this issue. Not currently on pain medication. Attend upcoming appointment for pain management and consider pain management options if pain persists.    Nissan Frazzini R Lowne Chase, DO

## 2023-12-25 NOTE — Assessment & Plan Note (Signed)
Repeat was normal.

## 2023-12-26 ENCOUNTER — Ambulatory Visit: Payer: Self-pay | Admitting: Family Medicine

## 2023-12-29 ENCOUNTER — Other Ambulatory Visit: Payer: Self-pay | Admitting: Internal Medicine

## 2024-01-02 ENCOUNTER — Ambulatory Visit (HOSPITAL_BASED_OUTPATIENT_CLINIC_OR_DEPARTMENT_OTHER)
Admission: RE | Admit: 2024-01-02 | Discharge: 2024-01-02 | Disposition: A | Discharge status: Home / Self Care | Source: Ambulatory Visit | Attending: Family Medicine | Admitting: Family Medicine

## 2024-01-02 ENCOUNTER — Encounter (HOSPITAL_BASED_OUTPATIENT_CLINIC_OR_DEPARTMENT_OTHER): Payer: Self-pay

## 2024-01-02 DIAGNOSIS — Z1231 Encounter for screening mammogram for malignant neoplasm of breast: Secondary | ICD-10-CM | POA: Insufficient documentation

## 2024-01-04 DIAGNOSIS — G5702 Lesion of sciatic nerve, left lower limb: Secondary | ICD-10-CM | POA: Diagnosis not present

## 2024-01-09 ENCOUNTER — Other Ambulatory Visit: Payer: Self-pay | Admitting: Family Medicine

## 2024-01-09 DIAGNOSIS — E1169 Type 2 diabetes mellitus with other specified complication: Secondary | ICD-10-CM

## 2024-01-09 MED ORDER — EZETIMIBE 10 MG PO TABS: 10.0000 mg | ORAL_TABLET | Freq: Every day | ORAL | 1 refills | Status: AC

## 2024-01-09 NOTE — Telephone Encounter (Signed)
 Copied from CRM [id number]. Topic: Clinical - Medication Refill >> Jan 09, 2024 12:52 PM Gennette ORN wrote: Medication: ezetimibe  (ZETIA ) 10 MG tablet  Has the patient contacted their pharmacy? Yes (Agent: If no, request that the patient contact the pharmacy for the refill. If patient does not wish to contact the pharmacy document the reason why and proceed with request.) (Agent: If yes, when and what did the pharmacy advise?)  This is the patient's preferred pharmacy:  [id number] [place], [place] - [address] AT [facility] [address] & [facility] [address] [place] [address] Phone: [phone] Fax: [phone]   Is this the correct pharmacy for this prescription? Yes If no, delete pharmacy and type the correct one.   Has the prescription been filled recently? Yes  Is the patient out of the medication? No  Has the patient been seen for an appointment in the last year OR does the patient have an upcoming appointment? Yes  Can we respond through MyChart? No  Agent: Please be advised that Rx refills may take up to 3 business days. We ask that you follow-up with your pharmacy.

## 2024-01-22 DIAGNOSIS — G5702 Lesion of sciatic nerve, left lower limb: Secondary | ICD-10-CM | POA: Diagnosis not present

## 2024-02-05 ENCOUNTER — Encounter: Payer: Self-pay | Admitting: Internal Medicine

## 2024-02-05 ENCOUNTER — Ambulatory Visit: Payer: Self-pay | Admitting: Internal Medicine

## 2024-02-05 ENCOUNTER — Ambulatory Visit: Admitting: Internal Medicine

## 2024-02-05 VITALS — BP 126/58 | HR 58 | Temp 98.3°F | Ht 63.5 in | Wt 151.6 lb

## 2024-02-05 DIAGNOSIS — J4541 Moderate persistent asthma with (acute) exacerbation: Secondary | ICD-10-CM

## 2024-02-05 LAB — POCT EXHALED NITRIC OXIDE

## 2024-02-05 MED ORDER — PREDNISONE 10 MG PO TABS: ORAL_TABLET | ORAL | 0 refills | Status: DC

## 2024-02-05 MED ORDER — AZITHROMYCIN 250 MG PO TABS: ORAL_TABLET | ORAL | 1 refills | Status: DC

## 2024-02-05 NOTE — Telephone Encounter (Signed)
 Pt has an appt with Dr Geronimo today. NFN

## 2024-02-05 NOTE — Patient Instructions (Addendum)
 ICD-10-CM   1. Moderate persistent asthma with acute exacerbation  J45.41       There is asthma attack  Plan  - Please take prednisone  40 mg x1 day, then 30 mg x1 day, then 20 mg x1 day, then 10 mg x1 day, and then 5 mg x1 day and stop - Azithromycin  Z PAK - do spiro and dlco in 8 weeks - continue breo scheduled + albuterol  as neeed  Followoup - Estabilish with DR Geronimo or Dr Marny Kluver in 2 months

## 2024-02-05 NOTE — Telephone Encounter (Signed)
 FYI Only or Action Required?: FYI only for provider.  Patient is followed in Pulmonology for asthma, last seen on 11/27/2023 by Neysa Reggy BIRCH, MD.  Called Nurse Triage reporting Cough.  Symptoms began several weeks ago.  Interventions attempted: OTC medications: Allegra, Rescue inhaler, and Maintenance inhaler.  Symptoms are: gradually worsening.  Triage Disposition: See Physician Within 24 Hours (overriding See HCP Within 4 Hours (Or PCP Triage))  Patient/caregiver understands and will follow disposition?: Yes                             Copied from CRM [id number]. Topic: Clinical - Red Word Triage >> Feb 05, 2024  9:32 AM Rozanna MATSU wrote: Red Word that prompted transfer to Nurse Triage: Pt stated she is coughing really bad for about a week and showing clear mucus Reason for Disposition  [1] MILD difficulty breathing (e.g., minimal/no SOB at rest, SOB with walking, pulse < 100) AND [2] still present when not coughing  Answer Assessment - Initial Assessment Questions E2C2 Pulmonary Triage - Initial Assessment Questions  1. Chief Complaint (e.g., cough, sob, wheezing, fever, chills, sweat or additional symptoms) *Go to specific symptom protocol after initial questions. Productive cough   2. Have you tested for COVID or Flu? Note: If not, ask patient if a home test can be taken. If so, instruct patient to call back for positive results. Denies  3. Have you used any OTC meds to help with symptoms? If yes, ask What medications? Allegra   4. Have you used your inhalers/maintenance medication? If yes, What medications? Breo 1 x day, albuterol  4-5 x day, states this is an increase from baseline   5. Do you wear supplemental oxygen ? If yes, How many liters are you supposed to use? Denies   6. Do you monitor your oxygen  levels? If yes, What is your reading (oxygen  level) today? Denies, states she does not have pulse oximeter with  her  1. ONSET: When did the cough begin?      A week 2. SEVERITY: How bad is the cough today?      States cough is worsening  3. SPUTUM: Describe the color of your sputum (e.g., none, dry cough; clear, white, yellow, green)     Yellow 5. DIFFICULTY BREATHING: Are you having difficulty breathing? If Yes, ask: How bad is it? (e.g., mild, moderate, severe)      States breathing seems more labored, SOB upon exertion, patient able to speak in clear and complete sentences while on phone with this RN 6. FEVER: Do you have a fever? If Yes, ask: What is your temperature, how was it measured, and when did it start?     Denies 8. LUNG HISTORY: Do you have any history of lung disease?  (e.g., pulmonary embolus, asthma, emphysema)     Asthma 10. OTHER SYMPTOMS: Do you have any other symptoms? (e.g., runny nose, wheezing, chest pain)     Denies chest pain, denies wheezing, denies additional symptoms    Patient stated [place]. Scheduled same day appointment for this afternoon in office with alternate provider. Advised patient to call back if symptoms worsen. Patient verbalized understanding.  Protocols used: Cough - Acute Productive-A-AH

## 2024-02-05 NOTE — Progress Notes (Signed)
 OV 02/05/2024  Subjective:  Patient ID: [name], female , DOB: 12/18/1940 , age 83 y.o. , MRN: [id number] , ADDRESS: [address] [place] [address] PCP Antonio Meth, Jamee SAUNDERS, DO Patient Care Team: Antonio Meth, Jamee SAUNDERS, DO as PCP - General (Family Medicine) Neysa Reggy BIRCH, MD as Consulting Physician (Pulmonary Disease) Addie Cordella Hamilton, MD as Consulting Physician (Orthopedic Surgery) Vernetta Lonni GRADE, MD as Consulting Physician (Orthopedic Surgery) Joshua Sieving, MD as Consulting Physician (Dermatology) Carla Milling, RPH-CPP (Pharmacist)  This Provider for this visit: Treatment Team:  Attending Provider: Geronimo Amel, MD    02/05/2024 -   Chief Complaint  Patient presents with   Acute Visit    Pt states she has a really bad cough, with occasional yellow phlegm at night and has SOB both with exertion and rest. Pt states she has cough during the day, has gotten worse over a week and a half.      HPI [name] 83 y.o. -this is an acute visit.  Is the patient daughter continue suffers from moderate asthma.  She developed asthma in her 22s following a respiratory infection.  She lives in [place].  Some 10 days ago she went [place] 2 [place] that her daughter's dogs.  The daughter, granddaughter and family left for summer vacation to [place] and they just returned.  That she said there was a lot of ragweed's but she did not do anything with them she knows she has ragweed allergy .  Then for the last 10 days she has noticed increased cough particularly when she goes to sleep.  Then for the last 2 days there is more shortness of breath she noticed this while walking the dogs up to mountains.  While on her way back to [facility] she was coughing a lot.  The cough felt deep and therefore she was advised to make this acute visit but her daughter was very concerned.  Patient states he normally wakes a while before she ends  up in the ER.  PCP support is 18 suggesting active symptoms Exhalednitric oxide is normal at 11 ppb   Asthma Control Test ACT Total Score  02/05/2024  3:06 PM 18  05/26/2022 10:14 AM 24  05/26/2021  9:54 AM 15   Allergies  Allergen Reactions   Statins Other (See Comments)    Myalgias and muscle weakness   Jardiance  [Empagliflozin ] Rash        No results found for: NITRICOXIDE   PFT      No data to display          Latest Reference Range & Units 05/14/07 11:59 06/16/08 10:38 09/16/09 10:21 11/08/10 09:03 08/09/11 09:29 04/05/13 08:54 06/02/14 09:26 11/24/15 15:12 10/17/16 16:04 12/05/16 11:46 12/11/17 10:05 12/28/17 13:28 03/30/18 13:14 07/19/18 11:11 01/28/19 14:25 03/15/21 14:18 05/07/21 16:15 09/09/21 14:03 09/16/21 12:19 09/19/21 19:36 12/13/21 09:34 01/15/22 09:00 06/16/22 10:27 12/15/22 11:10 06/22/23 11:22 08/25/23 11:43  Eosinophils Absolute 0.0 - 0.5 K/uL 0.1 0.1 0.2 0.1 0.1 0.1 0.1 0.1 0.1 0.1 0.2 0.1 0.1 0.2 0.1 0.2 202 0.2 0.1 0.0 0.1 0.2 0.2 0.2 0.2 0.3   LAB RESULTS last 96 hours No results found.    Allergies  Allergen Reactions   Statins Other (See Comments)    Myalgias and muscle weakness   Jardiance  [Empagliflozin ] Rash      has a past medical history of Anemia, Arthritis, Asthma, Basal cell cancer, Chronic kidney  disease (1963), Complication of anesthesia, Diabetes mellitus (2010), DVT (deep venous thrombosis) (HCC) (2006), GERD (gastroesophageal reflux disease), Granulomatous lung disease (HCC) (2002), Hyperlipidemia, Hypertension (2004), Paralysis (HCC) (2006), and PTE (pulmonary thromboembolism) (HCC) (2006).   reports that she has never smoked. She has been exposed to tobacco smoke. She has never used smokeless tobacco.  Past Surgical History:  Procedure Laterality Date   ABDOMINAL EXPOSURE N/A 08/12/2016   Procedure: ABDOMINAL EXPOSURE;  Surgeon: Krystal JULIANNA Doing, MD;  Location: [facility] OR;  Service: Vascular;  Laterality: N/A;   ANTERIOR LAT  LUMBAR FUSION N/A 08/12/2016   Procedure: LUMBAR TWO-THREE, LUMBAR THREE-FOUR, LUMBAR FOUR-FIVE  ANTEROLATERAL LUMBAR INTERBODY FUSION;  Surgeon: Fairy Levels, MD;  Location: [facility] OR;  Service: Neurosurgery;  Laterality: N/A;  L2-3 L3-4 L4-5 Anterolateral lumbar interbody fusion   ANTERIOR LUMBAR FUSION N/A 08/12/2016   Procedure: Lumbar Five-Sacral One Anterior lumbar interbody fusion with Dr. Krystal Early to assist;  Surgeon: Fairy Levels, MD;  Location: [facility] OR;  Service: Neurosurgery;  Laterality: N/A;  L5-S1 Anterior lumbar interbody fusion with Dr. Krystal Early to assist   BUNIONECTOMY     CATARACT EXTRACTION Right 12/31/2018   CATARACT EXTRACTION Left 12/17/2018   CERVICAL FUSION  2006   Dr Louis, NS;post op hematoma & cns leak & urinary retention   COLONOSCOPY  1992 & 2002   negative   epidural steroids  2006   cervical spine   LUMBAR PERCUTANEOUS PEDICLE SCREW 4 LEVEL N/A 08/12/2016   Procedure: LUMBAR TWO-SACRAL ONE Percuataneous Pedicle Screws;  Surgeon: Fairy Levels, MD;  Location: [facility] OR;  Service: Neurosurgery;  Laterality: N/A;   ROTATOR CUFF REPAIR  2009   Right   SEPTOPLASTY     TONSILLECTOMY  83 years old   TOTAL HIP ARTHROPLASTY  06/01/2012   Procedure: TOTAL HIP ARTHROPLASTY ANTERIOR APPROACH;  Surgeon: Lonni CINDERELLA Poli, MD;  Location: [facility] ORS;  Service: Orthopedics;  Laterality: Left;  Left Total Hip Arthroplasty   TOTAL HIP ARTHROPLASTY Right 02/23/2018   Procedure: RIGHT TOTAL HIP ARTHROPLASTY ANTERIOR APPROACH;  Surgeon: Poli Lonni CINDERELLA, MD;  Location: [facility] ORS;  Service: Orthopedics;  Laterality: Right;   TUBAL LIGATION      Allergies  Allergen Reactions   Statins Other (See Comments)    Myalgias and muscle weakness   Jardiance  [Empagliflozin ] Rash    Immunization History  Administered Date(s) Administered   Fluad  Quad(high Dose 65+) 01/28/2019, 03/31/2020, 03/15/2021, 03/01/2022   INFLUENZA, HIGH DOSE SEASONAL PF 03/19/2013, 03/21/2016   Influenza Inj Mdck  Quad With Preservative 03/23/2018   Influenza Split 03/15/2012, 03/12/2023   Influenza Whole 02/27/2009   Influenza,inj,Quad PF,6+ Mos 03/13/2014, 02/16/2015   Influenza-Unspecified 03/15/2012, 01/28/2014, 03/13/2014, 02/16/2015, 02/14/2017   Moderna SARS-COV2 Booster Vaccination 04/14/2020, 12/04/2020   Moderna Sars-Covid-2 Vaccination 06/11/2019, 07/09/2019, 03/04/2023   Pfizer(Comirnaty )Fall Seasonal Vaccine 12 years and older 06/01/2022   Pneumococcal Conjugate-13 05/16/2013   Pneumococcal Polysaccharide-23 09/29/2014, 02/07/2020   Respiratory Syncytial Virus Vaccine ,Recomb Aduvanted(Arexvy ) 08/08/2023   Tdap 10/28/2010, 04/30/2021   Zoster Recombinant(Shingrix) 02/11/2019, 04/12/2019   Zoster, Live 05/12/2014    Family History  Problem Relation Age of Onset   COPD Father        emphysema   Cancer Mother        cns cancer   Diabetes Sister        TWIN sister ; also Fibromyalgia ; S/P stent 2004   Heart disease Sister        stents @ 60 & 68   Stroke Maternal  Grandmother        in  late 50s   Transient ischemic attack Paternal Aunt      Current Outpatient Medications:    Accu-Chek Softclix Lancets lancets, USE TO TEST BLOOD SUGAR TWICE DAILY AS DIRECTED, Disp: 100 each, Rfl: 12   albuterol  (VENTOLIN  HFA) 108 (90 Base) MCG/ACT inhaler, USE 2 INHALATIONS BY MOUTH  EVERY 4 TO 6 HOURS AS  NEEDED, Disp: 51 g, Rfl: 3   albuterol  (VENTOLIN  HFA) 108 (90 Base) MCG/ACT inhaler, Inhale 1-2 puffs into the lungs every 6 (six) hours as needed for wheezing or shortness of breath., Disp: 1 each, Rfl: 0   alendronate  (FOSAMAX ) 70 MG tablet, TAKE 1 TABLET(70 MG) BY MOUTH 1 TIME A WEEK, Disp: 12 tablet, Rfl: 3   azelastine  (ASTELIN ) 0.1 % nasal spray, Use 1-2 puffs in each nostril once or twice daily, Disp: 30 mL, Rfl: 12   azithromycin  (ZITHROMAX ) 250 MG tablet, Five hundred milligrams azithromycin  on day 1 followed by 250 mg daily azithromycin  x 4 days for total 5 days, Disp: 6 tablet, Rfl:  1   blood glucose meter kit and supplies KIT, Dispense based on patient and insurance preference. Use up to four times daily as directed., Disp: 1 each, Rfl: 0   Calcium Citrate-Vitamin D  (CALCIUM + D PO), Take 600 mg by mouth in the morning and at bedtime., Disp: , Rfl:    cetirizine (ZYRTEC) 10 MG tablet, Take 10 mg by mouth daily as needed for allergies., Disp: , Rfl:    cholecalciferol  (VITAMIN D3) 25 MCG (1000 UNIT) tablet, Take 1,000 Units by mouth daily., Disp: , Rfl:    clotrimazole -betamethasone  (LOTRISONE ) cream, Apply 1 Application topically daily., Disp: 30 g, Rfl: 0   cyclobenzaprine  (FLEXERIL ) 10 MG tablet, TAKE 1 TABLET BY MOUTH THREE TIMES DAILY AS NEEDED FOR HIP CONCERNS, Disp: 30 tablet, Rfl: 2   diclofenac Sodium (VOLTAREN) 1 % GEL, Apply topically daily as needed (Pain)., Disp: , Rfl:    ezetimibe  (ZETIA ) 10 MG tablet, Take 1 tablet (10 mg total) by mouth daily., Disp: 90 tablet, Rfl: 1   fenofibrate  160 MG tablet, TAKE 1 TABLET(160 MG) BY MOUTH DAILY, Disp: 90 tablet, Rfl: 2   FEROSUL 325 (65 Fe) MG tablet, TAKE 1 TABLET(325 MG) BY MOUTH DAILY WITH BREAKFAST, Disp: 90 tablet, Rfl: 1   fluconazole  (DIFLUCAN ) 150 MG tablet, Take 1 tablet (150 mg total) by mouth daily. May repeat in 3 days if needed., Disp: 2 tablet, Rfl: 0   glucose blood test strip, Use to check sugars twice daily, Disp: 100 each, Rfl: 12   Lidocaine  4 % PTCH, Apply 1 patch topically as needed (back pain)., Disp: , Rfl:    metFORMIN  (GLUCOPHAGE -XR) 500 MG 24 hr tablet, Take 1 tablet with morning meal, 1 tablet with lunch and 2 tablets with evening meal., Disp: 360 tablet, Rfl: 3   methocarbamol  (ROBAXIN ) 500 MG tablet, TAKE 1 TABLET(500 MG) BY MOUTH EVERY 6 HOURS AS NEEDED FOR MUSCLE SPASMS, Disp: 45 tablet, Rfl: 1   mirabegron  ER (MYRBETRIQ ) 50 MG TB24 tablet, TAKE 1 TABLET(50 MG) BY MOUTH DAILY, Disp: 90 tablet, Rfl: 3   Multiple Vitamin (MULTIVITAMIN WITH MINERALS) TABS tablet, Take 1 tablet by mouth daily.  Centrum One a day - 50+, Disp: , Rfl:    Multiple Vitamins-Minerals (PRESERVISION AREDS 2+MULTI VIT PO), Take 1 tablet by mouth in the morning and at bedtime., Disp: , Rfl:    mupirocin  ointment (BACTROBAN ) 2 %, Apply 1  Application topically 3 (three) times daily., Disp: 30 g, Rfl: 3   Polyethyl Glycol-Propyl Glycol (SYSTANE OP), Place 1 drop into both eyes daily as needed (dry eyes)., Disp: , Rfl:    polyethylene glycol (MIRALAX  / GLYCOLAX ) packet, Take 17 g by mouth daily as needed for mild constipation. , Disp: , Rfl:    predniSONE  (DELTASONE ) 10 MG tablet, Please take prednisone  40 mg x1 day, then 30 mg x1 day, then 20 mg x1 day, then 10 mg x1 day, and then 5 mg x1 day and stop, Disp: 11 tablet, Rfl: 0   pregabalin  (LYRICA ) 100 MG capsule, TAKE 1 CAPSULE BY MOUTH THREE TIMES DAILY, Disp: 270 capsule, Rfl: 1   Trolamine Salicylate (ASPERCREME EX), Apply 1 application topically daily as needed (back pain)., Disp: , Rfl:    VASCEPA  1 g capsule, TAKE 2 CAPSULES(2 GRAMS) BY MOUTH TWICE DAILY, Disp: 360 capsule, Rfl: 3   albuterol  (PROVENTIL ) (2.5 MG/3ML) 0.083% nebulizer solution, Take 3 mLs (2.5 mg total) by nebulization every 6 (six) hours as needed for wheezing or shortness of breath. (Patient not taking: Reported on 02/05/2024), Disp: 75 mL, Rfl: 12   aspirin  EC 81 MG tablet, Take 81 mg by mouth daily. (Patient not taking: Reported on 02/05/2024), Disp: , Rfl:    celecoxib  (CELEBREX ) 200 MG capsule, TAKE 1 CAPSULE(200 MG) BY MOUTH DAILY (Patient not taking: Reported on 02/05/2024), Disp: 90 capsule, Rfl: 3   traMADol  (ULTRAM ) 50 MG tablet, Take 1 tablet (50 mg total) by mouth every 6 (six) hours as needed. (Patient not taking: Reported on 02/05/2024), Disp: 30 tablet, Rfl: 0      Objective:   Vitals:   02/05/24 1506  BP: (!) 126/58  Pulse: (!) 58  Temp: 98.3 F (36.8 C)  SpO2: 97%  Weight: 151 lb 9.6 oz (68.8 kg)  Height: 5' 3.5 (1.613 m)    Estimated body mass index is 26.43 kg/m as  calculated from the following:   Height as of this encounter: 5' 3.5 (1.613 m).   Weight as of this encounter: 151 lb 9.6 oz (68.8 kg).  @WEIGHTCHANGE @  Filed Weights   02/05/24 1506  Weight: 151 lb 9.6 oz (68.8 kg)     Physical Exam   General: No distress. Look well. Some cough O2 at rest: no Cane present: no Sitting in wheel chair: no Frail: no Obese: non Neuro: Alert and Oriented x 3. GCS 15. Speech normal Psych: Pleasant Resp:  Barrel Chest - no.  Wheeze - no, Crackles - no, No overt respiratory distress CVS: Normal heart sounds. Murmurs - no Ext: Stigmata of Connective Tissue Disease - no HEENT: Normal upper airway. PEERL +. No post nasal drip        Assessment/     Assessment & Plan Moderate persistent asthma with acute exacerbation    PLAN Patient Instructions     ICD-10-CM   1. Moderate persistent asthma with acute exacerbation  J45.41       There is asthma attack  Plan  - Please take prednisone  40 mg x1 day, then 30 mg x1 day, then 20 mg x1 day, then 10 mg x1 day, and then 5 mg x1 day and stop - Azithromycin  Z PAK - do spiro and dlco in 8 weeks - continue breo scheduled + albuterol  as neeed  Followoup - Estabilish with DR Geronimo or Dr Marny Kluver in 2 months    FOLLOWUP    Return in about 8 weeks (around 04/01/2024) for with  Dr Geronimo, Asthma.    SIGNATURE    Dr. Dorethia Geronimo, M.D., F.C.C.P,  Pulmonary and Critical Care Medicine Staff Physician, [facility] Director - Interstitial Lung Disease  Program  Pulmonary Fibrosis [facility] at [facility] [place], [place], [address]  Pager: [phone], If no answer or between  15:00h - 7:00h: call [phone] Telephone: [phone]  4:07 PM 02/05/2024

## 2024-02-19 ENCOUNTER — Ambulatory Visit: Payer: Self-pay

## 2024-02-19 ENCOUNTER — Other Ambulatory Visit: Payer: Self-pay | Admitting: Family Medicine

## 2024-02-19 DIAGNOSIS — E1140 Type 2 diabetes mellitus with diabetic neuropathy, unspecified: Secondary | ICD-10-CM

## 2024-02-19 NOTE — Telephone Encounter (Unsigned)
 Copied from CRM [id number]. Topic: Clinical - Medication Refill >> Feb 19, 2024  2:47 PM Suzen RAMAN wrote: Medication:  traMADol  (ULTRAM ) 50 MG tablet    Has the patient contacted their pharmacy? Yes   This is the patient's preferred pharmacy:  [id number] [place], [place] - [address] AT [facility] OF [facility] [address] & [facility] [address] [place] [address] Phone: [phone] Fax: [phone]  Is this the correct pharmacy for this prescription? Yes If no, delete pharmacy and type the correct one.   Has the prescription been filled recently? No  Is the patient out of the medication? Yes  Has the patient been seen for an appointment in the last year OR does the patient have an upcoming appointment? Yes  Can we respond through MyChart? Yes  Agent: Please be advised that Rx refills may take up to 3 business days. We ask that you follow-up with your pharmacy.

## 2024-02-19 NOTE — Telephone Encounter (Signed)
 Appt scheduled

## 2024-02-19 NOTE — Telephone Encounter (Signed)
 Requesting: Lyrica  Contract: 05/2022 UDS: 05/2022 Last OV: 12/25/2023 Next OV: 03/04/2024 Last Refill: 08/15/2023, #[id number] RF Database:   Please advise

## 2024-02-19 NOTE — Telephone Encounter (Signed)
 FYI Only or Action Required?: Action required by provider: request for appointment.  Patient was last seen in primary care on 12/25/2023 by Antonio Meth, Jamee SAUNDERS, DO.  Called Nurse Triage reporting Flank Pain.  Symptoms began several years ago.  Interventions attempted: muscle relaxer.  Symptoms are: unchanged.  Triage Disposition: See Physician Within 24 Hours  Patient/caregiver understands and will follow disposition?: YesCopied from CRM #[id number]. Topic: Clinical - Red Word Triage >> Feb 19, 2024  2:43 PM Suzen RAMAN wrote: Red Word that prompted transfer to Nurse Triage: left side pain and tightness requesting a refill traMADol  (ULTRAM ) 50 MG tablet Reason for Disposition  MODERATE pain (e.g., interferes with normal activities or awakens from sleep)  Answer Assessment - Initial Assessment Questions Spine surgery in 2018. They entered on left side and I have pain. The pain clinic doesn't  refill tramadol . It ran out. Ive been getting lumbar injection. I don't go to go until I really need it.My body is so tense and tight. I haven't slept in 2 nights.  I go back on 10/8 for a review. No appt tomorrow. Pt is being seen at [facility] with McG[place]en at 0900.     1. LOCATION: Where does it hurt? (e.g., left, right)     Left side  2. ONSET: When did the pain start?     On and off since 2018 3. SEVERITY: How bad is the pain? (e.g., Scale 1-10; mild, moderate, or severe)     7 4. PATTERN: Does the pain come and go, or is it constant?      Comes and goes 5. CAUSE: What do you think is causing the pain?     Spinal surgery  6. OTHER SYMPTOMS:  Do you have any other symptoms? (e.g., fever, abdomen pain, vomiting, leg weakness, burning with urination, blood in urine) Muscle cramp in left leg  Protocols used: Flank Pain-A-AH

## 2024-02-20 ENCOUNTER — Ambulatory Visit (INDEPENDENT_AMBULATORY_CARE_PROVIDER_SITE_OTHER): Admitting: Family Medicine

## 2024-02-20 ENCOUNTER — Encounter: Payer: Self-pay | Admitting: Family Medicine

## 2024-02-20 VITALS — BP 129/65 | HR 56 | Temp 98.0°F | Ht 63.5 in | Wt 156.0 lb

## 2024-02-20 DIAGNOSIS — G5702 Lesion of sciatic nerve, left lower limb: Secondary | ICD-10-CM | POA: Diagnosis not present

## 2024-02-20 DIAGNOSIS — M7918 Myalgia, other site: Secondary | ICD-10-CM

## 2024-02-20 MED ORDER — TRAMADOL HCL 50 MG PO TABS: 50.0000 mg | ORAL_TABLET | Freq: Four times a day (QID) | ORAL | 0 refills | Status: DC | PRN

## 2024-02-20 NOTE — Progress Notes (Signed)
 OFFICE VISIT  02/20/2024  CC:  Chief Complaint  Patient presents with   Flank Pain    Left side and tightness radiating to groin/hip area; had injection in August    Patient is a 83 y.o. female who presents for left buttock pain.  HPI: A couple of days ago she noted onset of worsening of her left buttock pain in the area of the piriformis.  She had slept on a different bed and traveled just prior. No radiating pain. No paresthesias.  She takes Celebrex  daily for her left knee pain.  She has lumbar spondylosis and gets periodic epidural injections. Her neurosurgeon had been prescribing tramadol  for as needed pain use but this has been discontinued so she was interested in getting a refill.    PMP AWARE reviewed today: most recent rx for tramadol  was filled 10/30/23, # 30, rx by Dr. Jamee Shanks. No red flags.  Past Medical History:  Diagnosis Date   Anemia    Arthritis    Asthma    adult onset   Basal cell cancer    LUE; Porokeratosis also   Chronic kidney disease 1963   strep in kidney due to strep throat-hospitalized 10 days   Complication of anesthesia    small trachea   Diabetes mellitus 2010   A1c 6.7%   DVT (deep venous thrombosis) (HCC) 2006   post immobilization post cns surgery   GERD (gastroesophageal reflux disease)    very mild   Granulomatous lung disease (HCC) 2002   incidental Xray finding   Hyperlipidemia    Hypertension 2004   Hypertensive response on Stress Test   Paralysis (HCC) 2006   post cervical fusion with spinal sac tear  with hematoma    PTE (pulmonary thromboembolism) (HCC) 2006    Past Surgical History:  Procedure Laterality Date   ABDOMINAL EXPOSURE N/A 08/12/2016   Procedure: ABDOMINAL EXPOSURE;  Surgeon: Krystal JULIANNA Doing, MD;  Location: [facility] OR;  Service: Vascular;  Laterality: N/A;   ANTERIOR LAT LUMBAR FUSION N/A 08/12/2016   Procedure: LUMBAR TWO-THREE, LUMBAR THREE-FOUR, LUMBAR FOUR-FIVE  ANTEROLATERAL LUMBAR INTERBODY FUSION;   Surgeon: Fairy Levels, MD;  Location: [facility] OR;  Service: Neurosurgery;  Laterality: N/A;  L2-3 L3-4 L4-5 Anterolateral lumbar interbody fusion   ANTERIOR LUMBAR FUSION N/A 08/12/2016   Procedure: Lumbar Five-Sacral One Anterior lumbar interbody fusion with Dr. Krystal Early to assist;  Surgeon: Fairy Levels, MD;  Location: [facility] OR;  Service: Neurosurgery;  Laterality: N/A;  L5-S1 Anterior lumbar interbody fusion with Dr. Krystal Early to assist   BUNIONECTOMY     CATARACT EXTRACTION Right 12/31/2018   CATARACT EXTRACTION Left 12/17/2018   CERVICAL FUSION  2006   Dr Louis, [place];post op hematoma & cns leak & urinary retention   COLONOSCOPY  1992 & 2002   negative   epidural steroids  2006   cervical spine   LUMBAR PERCUTANEOUS PEDICLE SCREW 4 LEVEL N/A 08/12/2016   Procedure: LUMBAR TWO-SACRAL ONE Percuataneous Pedicle Screws;  Surgeon: Fairy Levels, MD;  Location: [facility] OR;  Service: Neurosurgery;  Laterality: N/A;   ROTATOR CUFF REPAIR  2009   Right   SEPTOPLASTY     TONSILLECTOMY  83 years old   TOTAL HIP ARTHROPLASTY  06/01/2012   Procedure: TOTAL HIP ARTHROPLASTY ANTERIOR APPROACH;  Surgeon: Lonni CINDERELLA Poli, MD;  Location: [facility] ORS;  Service: Orthopedics;  Laterality: Left;  Left Total Hip Arthroplasty   TOTAL HIP ARTHROPLASTY Right 02/23/2018   Procedure: RIGHT TOTAL HIP ARTHROPLASTY ANTERIOR  APPROACH;  Surgeon: Vernetta Lonni GRADE, MD;  Location: [facility] ORS;  Service: Orthopedics;  Laterality: Right;   TUBAL LIGATION      Outpatient Medications Prior to Visit  Medication Sig Dispense Refill   Accu-Chek Softclix Lancets lancets USE TO TEST BLOOD SUGAR TWICE DAILY AS DIRECTED 100 each 12   albuterol  (VENTOLIN  HFA) 108 (90 Base) MCG/ACT inhaler USE 2 INHALATIONS BY MOUTH  EVERY 4 TO 6 HOURS AS  NEEDED 51 g 3   albuterol  (VENTOLIN  HFA) 108 (90 Base) MCG/ACT inhaler Inhale 1-2 puffs into the lungs every 6 (six) hours as needed for wheezing or shortness of breath. 1 each 0   alendronate  (FOSAMAX ) 70 MG  tablet TAKE 1 TABLET(70 MG) BY MOUTH 1 TIME A WEEK 12 tablet 3   azelastine  (ASTELIN ) 0.1 % nasal spray Use 1-2 puffs in each nostril once or twice daily 30 mL 12   blood glucose meter kit and supplies KIT Dispense based on patient and insurance preference. Use up to four times daily as directed. 1 each 0   Calcium Citrate-Vitamin D  (CALCIUM + D PO) Take 600 mg by mouth in the morning and at bedtime.     celecoxib  (CELEBREX ) 200 MG capsule TAKE 1 CAPSULE(200 MG) BY MOUTH DAILY 90 capsule 3   cetirizine (ZYRTEC) 10 MG tablet Take 10 mg by mouth daily as needed for allergies.     cholecalciferol  (VITAMIN D3) 25 MCG (1000 UNIT) tablet Take 1,000 Units by mouth daily.     clotrimazole -betamethasone  (LOTRISONE ) cream Apply 1 Application topically daily. 30 g 0   cyclobenzaprine  (FLEXERIL ) 10 MG tablet TAKE 1 TABLET BY MOUTH THREE TIMES DAILY AS NEEDED FOR HIP CONCERNS 30 tablet 2   diclofenac Sodium (VOLTAREN) 1 % GEL Apply topically daily as needed (Pain).     ezetimibe  (ZETIA ) 10 MG tablet Take 1 tablet (10 mg total) by mouth daily. 90 tablet 1   fenofibrate  160 MG tablet TAKE 1 TABLET(160 MG) BY MOUTH DAILY 90 tablet 2   FEROSUL 325 (65 Fe) MG tablet TAKE 1 TABLET(325 MG) BY MOUTH DAILY WITH BREAKFAST 90 tablet 1   fluconazole  (DIFLUCAN ) 150 MG tablet Take 1 tablet (150 mg total) by mouth daily. May repeat in 3 days if needed. 2 tablet 0   glucose blood test strip Use to check sugars twice daily 100 each 12   Lidocaine  4 % PTCH Apply 1 patch topically as needed (back pain).     metFORMIN  (GLUCOPHAGE -XR) 500 MG 24 hr tablet Take 1 tablet with morning meal, 1 tablet with lunch and 2 tablets with evening meal. 360 tablet 3   methocarbamol  (ROBAXIN ) 500 MG tablet TAKE 1 TABLET(500 MG) BY MOUTH EVERY 6 HOURS AS NEEDED FOR MUSCLE SPASMS 45 tablet 1   mirabegron  ER (MYRBETRIQ ) 50 MG TB24 tablet TAKE 1 TABLET(50 MG) BY MOUTH DAILY 90 tablet 3   Multiple Vitamin (MULTIVITAMIN WITH MINERALS) TABS tablet Take  1 tablet by mouth daily. Centrum One a day - 50+     Multiple Vitamins-Minerals (PRESERVISION AREDS 2+MULTI VIT PO) Take 1 tablet by mouth in the morning and at bedtime.     mupirocin  ointment (BACTROBAN ) 2 % Apply 1 Application topically 3 (three) times daily. 30 g 3   Polyethyl Glycol-Propyl Glycol (SYSTANE OP) Place 1 drop into both eyes daily as needed (dry eyes).     polyethylene glycol (MIRALAX  / GLYCOLAX ) packet Take 17 g by mouth daily as needed for mild constipation.  pregabalin  (LYRICA ) 100 MG capsule TAKE 1 CAPSULE BY MOUTH THREE TIMES DAILY 270 capsule 1   Trolamine Salicylate (ASPERCREME EX) Apply 1 application topically daily as needed (back pain).     VASCEPA  1 g capsule TAKE 2 CAPSULES(2 GRAMS) BY MOUTH TWICE DAILY 360 capsule 3   albuterol  (PROVENTIL ) (2.5 MG/3ML) 0.083% nebulizer solution Take 3 mLs (2.5 mg total) by nebulization every 6 (six) hours as needed for wheezing or shortness of breath. (Patient not taking: Reported on 02/20/2024) 75 mL 12   aspirin  EC 81 MG tablet Take 81 mg by mouth daily. (Patient not taking: Reported on 02/20/2024)     azithromycin  (ZITHROMAX ) 250 MG tablet Five hundred milligrams azithromycin  on day 1 followed by 250 mg daily azithromycin  x 4 days for total 5 days (Patient not taking: Reported on 02/20/2024) 6 tablet 1   traMADol  (ULTRAM ) 50 MG tablet Take 1 tablet (50 mg total) by mouth every 6 (six) hours as needed. (Patient not taking: Reported on 02/20/2024) 30 tablet 0   predniSONE  (DELTASONE ) 10 MG tablet Please take prednisone  40 mg x1 day, then 30 mg x1 day, then 20 mg x1 day, then 10 mg x1 day, and then 5 mg x1 day and stop (Patient not taking: Reported on 02/20/2024) 11 tablet 0   No facility-administered medications prior to visit.    Allergies  Allergen Reactions   Statins Other (See Comments)    Myalgias and muscle weakness   Jardiance  [Empagliflozin ] Rash    Review of Systems  As per HPI  PE:    02/20/2024    9:11 AM 02/20/2024     9:03 AM 02/05/2024    3:06 PM  Vitals with BMI  Height  5' 3.5 5' 3.5  Weight  156 lbs 151 lbs 10 oz  BMI  27.2 26.43  Systolic 129 159 873  Diastolic 65 72 58  Pulse  56 58     Physical Exam Exam chaperoned by Bobbetta Degree, CMA.  Gen: Alert, well appearing.  Patient is oriented to person, place, time, and situation. AFFECT: pleasant, lucid thought and speech. She has no tenderness to palpation in the lumbar spine or the SI joints. She has focal tenderness to palpation in the area of the left mid gluteal region consistent with the piriformis muscle. Supine straight leg raise negative bilaterally. 5/5 strength in legs bilaterally.  LABS:  Last CBC Lab Results  Component Value Date   WBC 6.5 08/25/2023   HGB 11.4 (L) 08/25/2023   HCT 35.3 (L) 08/25/2023   MCV 87.8 08/25/2023   MCH 28.4 08/25/2023   RDW 15.0 08/25/2023   PLT 232 08/25/2023   Last metabolic panel Lab Results  Component Value Date   GLUCOSE 185 (H) 12/25/2023   NA 140 12/25/2023   K 4.6 12/25/2023   CL 104 12/25/2023   CO2 28 12/25/2023   BUN 21 12/25/2023   CREATININE 1.15 12/25/2023   GFR 44.24 (L) 12/25/2023   CALCIUM 10.5 12/25/2023   PROT 6.3 12/25/2023   ALBUMIN 4.2 12/25/2023   BILITOT 0.4 12/25/2023   ALKPHOS 38 (L) 12/25/2023   AST 19 12/25/2023   ALT 20 12/25/2023   ANIONGAP 7 08/25/2023   Last hemoglobin A1c Lab Results  Component Value Date   HGBA1C 7.7 (H) 12/25/2023   IMPRESSION AND PLAN:  Recurrent buttock pain consistent with piriformis pain. Her altered gait from chronic knee pain may be bringing this on. Her primary doctor, Dr. Antonio, apparently called in some tramadol   for her this morning. I will give her some piriformis exercises for home. No new prescriptions sent by me today.  An After Visit Summary was printed and given to the patient.  FOLLOW UP: Return for with PCP in the next 1-2 wks.  Signed:  Gerlene Hockey, MD           02/20/2024

## 2024-02-20 NOTE — Patient Instructions (Signed)
 Piriformis Syndrome Rehab Ask your health care provider which exercises are safe for you. Do exercises exactly as told by your health care provider and adjust them as directed. It is normal to feel mild stretching, pulling, tightness, or discomfort as you do these exercises. Stop right away if you feel sudden pain or your pain gets worse. Do not begin these exercises until told by your health care provider. Stretching and range-of-motion exercises These exercises warm up your muscles and joints and improve the movement and flexibility of your hip and pelvis. The exercises also help to relieve pain, numbness, and tingling. Nerve root  Sit on a firm surface that is high enough that you can swing your left / right foot freely. Place a folded towel under your left / right thigh. This is optional. Drop your head forward and round your back. While you keep your left / right foot relaxed, slowly straighten your left / right knee until you feel a slight pull behind your knee or calf. If your leg is fully extended and you still do not feel a pull, slowly tilt your foot and toes toward you. Hold this position for __________ seconds. Slowly return your knee to its starting position. Hip rotation This is an exercise in which you lie on your back and stretch the muscles that rotate your hip (hip rotators) to stretch your buttocks. Lie on your back on a firm surface. Pull your left / right knee toward your same shoulder with your left / right hand until your knee is pointing toward the ceiling. Hold your left / right ankle with your other hand. Keeping your knee steady, gently pull your left / right ankle toward your other shoulder until you feel a stretch in your buttocks. Hold this position for __________ seconds. Repeat __________ times. Complete this exercise __________ times a day. Hip extensor This is an exercise in which you lie on your back and pull your knee to your chest. Lie on your back on a firm  surface. Both of your legs should be straight. Pull your left / right knee to your chest. Hold your leg in this position by holding on to the back of your thigh or the front of your knee. Hold this position for __________ seconds. Slowly return to the starting position. Repeat __________ times. Complete this exercise __________ times a day. Strengthening exercises These exercises build strength and endurance in your hip and thigh muscles. Endurance is the ability to use your muscles for a long time, even after they get tired. Straight leg raises, side-lying This exercise strengthens the muscles that rotate the leg at the hip and move it away from your body (hip abductors). Lie on your side with your left / right leg in the top position. Lie so your head, shoulder, knee, and hip line up. Bend your bottom knee to help you balance. Lift your top leg 4-6 inches (10-15 cm) while keeping your toes pointed straight ahead. Hold this position for __________ seconds. Slowly lower your leg to the starting position. Let your muscles relax completely after each repetition. Repeat __________ times. Complete this exercise __________ times a day. Hip abduction and rotation This is sometimes called quadruped (on hands and knees) exercises. Get on your hands and knees on a firm, lightly padded surface. Your hands should be directly below your shoulders, and your knees should be directly below your hips. Lift your left / right knee out to the side. Keep your knee bent. Do not twist  your body. Hold this position for __________ seconds. Slowly lower your leg. Repeat __________ times. Complete this exercise __________ times a day. Straight leg raises, prone This exercise stretches the muscles that move the hips (hip extensors). Lie on your abdomen on a firm surface (prone position). Tense the muscles in your buttocks and lift your left / right leg about 4 inches (10 cm). Keep your knee straight as you lift your  leg. If you cannot lift your leg that high without arching your back, place a pillow under your hips. Hold this position for __________ seconds. Slowly lower your leg to the starting position. Let your muscles relax completely after each repetition. Repeat __________ times. Complete this exercise __________ times a day. This information is not intended to replace advice given to you by your health care provider. Make sure you discuss any questions you have with your health care provider. Document Revised: 11/17/2020 Document Reviewed: 11/17/2020 Elsevier Patient Education  2024 [employer].

## 2024-02-20 NOTE — Telephone Encounter (Signed)
 Requesting: tramadol  50mg   Contract: None UDS: 06/16/22 Last Visit: 12/25/23 Next Visit: 03/04/24 Last Refill: 10/30/23 #30 and 0RF   Please Advise

## 2024-03-04 ENCOUNTER — Encounter: Payer: Self-pay | Admitting: Family Medicine

## 2024-03-04 ENCOUNTER — Ambulatory Visit (INDEPENDENT_AMBULATORY_CARE_PROVIDER_SITE_OTHER): Admitting: Family Medicine

## 2024-03-04 VITALS — BP 134/60 | HR 57 | Temp 98.0°F | Resp 18 | Ht 63.5 in | Wt 156.6 lb

## 2024-03-04 DIAGNOSIS — Z7984 Long term (current) use of oral hypoglycemic drugs: Secondary | ICD-10-CM | POA: Diagnosis not present

## 2024-03-04 DIAGNOSIS — I10 Essential (primary) hypertension: Secondary | ICD-10-CM

## 2024-03-04 DIAGNOSIS — E1169 Type 2 diabetes mellitus with other specified complication: Secondary | ICD-10-CM

## 2024-03-04 DIAGNOSIS — E1140 Type 2 diabetes mellitus with diabetic neuropathy, unspecified: Secondary | ICD-10-CM

## 2024-03-04 DIAGNOSIS — D509 Iron deficiency anemia, unspecified: Secondary | ICD-10-CM

## 2024-03-04 DIAGNOSIS — E785 Hyperlipidemia, unspecified: Secondary | ICD-10-CM

## 2024-03-04 LAB — CBC WITH DIFFERENTIAL/PLATELET
Basophils Absolute: 0 K/uL (ref 0.0–0.1)
Basophils Relative: 0.4 % (ref 0.0–3.0)
Eosinophils Absolute: 0.1 K/uL (ref 0.0–0.7)
Eosinophils Relative: 1.6 % (ref 0.0–5.0)
HCT: 33.6 % — ABNORMAL LOW (ref 36.0–46.0)
Hemoglobin: 11.4 g/dL — ABNORMAL LOW (ref 12.0–15.0)
Lymphocytes Relative: 32.7 % (ref 12.0–46.0)
Lymphs Abs: 1.8 K/uL (ref 0.7–4.0)
MCHC: 34 g/dL (ref 30.0–36.0)
MCV: 90.5 fl (ref 78.0–100.0)
Monocytes Absolute: 0.4 K/uL (ref 0.1–1.0)
Monocytes Relative: 8.2 % (ref 3.0–12.0)
Neutro Abs: 3.1 K/uL (ref 1.4–7.7)
Neutrophils Relative %: 57.1 % (ref 43.0–77.0)
Platelets: 219 K/uL (ref 150.0–400.0)
RBC: 3.72 Mil/uL — ABNORMAL LOW (ref 3.87–5.11)
RDW: 14.6 % (ref 11.5–15.5)
WBC: 5.4 K/uL (ref 4.0–10.5)

## 2024-03-04 LAB — TSH: TSH: 5.12 u[IU]/mL (ref 0.35–5.50)

## 2024-03-04 LAB — HEMOGLOBIN A1C: Hgb A1c MFr Bld: 8.3 % — ABNORMAL HIGH (ref 4.6–6.5)

## 2024-03-04 NOTE — Progress Notes (Signed)
 Subjective:    Patient ID: [name], female    DOB: 10/11/1940, 83 y.o.   MRN: [id number]  Chief Complaint  Patient presents with   Kidney Function   Follow-up    HPI Patient is in today for f/u dm, chol, bp and kidney function.  Discussed the use of AI scribe software for clinical note transcription with the patient, who gave verbal consent to proceed.  History of Present Illness [name] is an 83 year old female who presents with concerns about kidney function and recent symptoms following medication use.  She recently experienced an episode of bronchitis. She also reports pain at the incision site of a previous spinal surgery, where organs were moved during the procedure. This area has been sore and tender lately.  She describes a past reaction to the medication Jardiance , which caused her urine to become 'real golden yellow.' A similar change in urine color occurred with prednisone , but it lasted only one day. She notes that her kidneys seem sensitive to 'heavy duty drugs.' She denies any current urinary symptoms.  Her blood pressure was recently measured at 126/unknown, and her oxygen  saturation was 97-98%, though it was 95% today. She has not visited the eye doctor as planned in September due to other commitments, but she notes her vision has changed slightly and she may need glasses.  She received her flu and COVID vaccinations last week. [place], which she enjoyed. She mentions weight fluctuations, having maintained her weight at 151 pounds while away, but increased to 156 pounds upon returning home.  She reports a past rash associated with medication use.    Past Medical History:  Diagnosis Date   Anemia    Arthritis    Asthma    adult onset   Basal cell cancer    LUE; Porokeratosis also   Chronic kidney disease 1963   strep in kidney due to strep throat-hospitalized 10 days   Complication of anesthesia    small trachea    Diabetes mellitus 2010   A1c 6.7%   DVT (deep venous thrombosis) (HCC) 2006   post immobilization post cns surgery   GERD (gastroesophageal reflux disease)    very mild   Granulomatous lung disease (HCC) 2002   incidental Xray finding   Hyperlipidemia    Hypertension 2004   Hypertensive response on Stress Test   Paralysis (HCC) 2006   post cervical fusion with spinal sac tear  with hematoma    PTE (pulmonary thromboembolism) (HCC) 2006    Past Surgical History:  Procedure Laterality Date   ABDOMINAL EXPOSURE N/A 08/12/2016   Procedure: ABDOMINAL EXPOSURE;  Surgeon: Krystal JULIANNA Doing, MD;  Location: [facility] OR;  Service: Vascular;  Laterality: N/A;   ANTERIOR LAT LUMBAR FUSION N/A 08/12/2016   Procedure: LUMBAR TWO-THREE, LUMBAR THREE-FOUR, LUMBAR FOUR-FIVE  ANTEROLATERAL LUMBAR INTERBODY FUSION;  Surgeon: Fairy Levels, MD;  Location: [facility] OR;  Service: Neurosurgery;  Laterality: N/A;  L2-3 L3-4 L4-5 Anterolateral lumbar interbody fusion   ANTERIOR LUMBAR FUSION N/A 08/12/2016   Procedure: Lumbar Five-Sacral One Anterior lumbar interbody fusion with Dr. Krystal Early to assist;  Surgeon: Fairy Levels, MD;  Location: [facility] OR;  Service: Neurosurgery;  Laterality: N/A;  L5-S1 Anterior lumbar interbody fusion with Dr. Krystal Early to assist   BUNIONECTOMY     CATARACT EXTRACTION Right 12/31/2018   CATARACT EXTRACTION Left 12/17/2018   CERVICAL FUSION  2006   Dr Louis, [place];post op hematoma &  cns leak & urinary retention   COLONOSCOPY  1992 & 2002   negative   epidural steroids  2006   cervical spine   LUMBAR PERCUTANEOUS PEDICLE SCREW 4 LEVEL N/A 08/12/2016   Procedure: LUMBAR TWO-SACRAL ONE Percuataneous Pedicle Screws;  Surgeon: Fairy Levels, MD;  Location: [facility] OR;  Service: Neurosurgery;  Laterality: N/A;   ROTATOR CUFF REPAIR  2009   Right   SEPTOPLASTY     TONSILLECTOMY  83 years old   TOTAL HIP ARTHROPLASTY  06/01/2012   Procedure: TOTAL HIP ARTHROPLASTY ANTERIOR APPROACH;  Surgeon: Lonni CINDERELLA Poli, MD;  Location: [facility] ORS;  Service: Orthopedics;  Laterality: Left;  Left Total Hip Arthroplasty   TOTAL HIP ARTHROPLASTY Right 02/23/2018   Procedure: RIGHT TOTAL HIP ARTHROPLASTY ANTERIOR APPROACH;  Surgeon: Poli Lonni CINDERELLA, MD;  Location: [facility] ORS;  Service: Orthopedics;  Laterality: Right;   TUBAL LIGATION      Family History  Problem Relation Age of Onset   COPD Father        emphysema   Cancer Mother        cns cancer   Diabetes Sister        TWIN sister ; also Fibromyalgia ; S/P stent 2004   Heart disease Sister        stents @ 28 & 57   Stroke Maternal Grandmother        in  late 82s   Transient ischemic attack Paternal Aunt     Social History   Socioeconomic History   Marital status: Divorced    Spouse name: Not on file   Number of children: Not on file   Years of education: Not on file   Highest education level: Not on file  Occupational History   Not on file  Tobacco Use   Smoking status: Never    Passive exposure: Past   Smokeless tobacco: Never  Vaping Use   Vaping status: Never Used  Substance and Sexual Activity   Alcohol use: Yes    Alcohol/week: 1.0 standard drink of alcohol    Types: 1 Glasses of wine per week    Comment: socially   Drug use: No   Sexual activity: Never  Other Topics Concern   Not on file  Social History Narrative   Not on file   Social Drivers of Health   Financial Resource Strain: Low Risk  (10/29/2021)   Overall Financial Resource Strain (CARDIA)    Difficulty of Paying Living Expenses: Not hard at all  Food Insecurity: No Food Insecurity (07/14/2022)   Hunger Vital Sign    Worried About Running Out of Food in the Last Year: Never true    Ran Out of Food in the Last Year: Never true  Transportation Needs: No Transportation Needs (07/14/2022)   PRAPARE - Administrator, Civil Service (Medical): No    Lack of Transportation (Non-Medical): No  Physical Activity: Insufficiently Active (07/14/2022)    Exercise Vital Sign    Days of Exercise per Week: 3 days    Minutes of Exercise per Session: 40 min  Stress: No Stress Concern Present (07/09/2021)   [employer] of Occupational Health - Occupational Stress Questionnaire    Feeling of Stress : Not at all  Social Connections: Moderately Isolated (07/01/2022)   Social Connection and Isolation Panel    Frequency of Communication with Friends and Family: More than three times a week    Frequency of Social Gatherings with Friends and Family: More  than three times a week    Attends Religious Services: Never    Active Member of Clubs or Organizations: Yes    Attends Banker Meetings: More than 4 times per year    Marital Status: Divorced  Intimate Partner Violence: Not At Risk (07/14/2022)   Humiliation, Afraid, Rape, and Kick questionnaire    Fear of Current or Ex-Partner: No    Emotionally Abused: No    Physically Abused: No    Sexually Abused: No    Outpatient Medications Prior to Visit  Medication Sig Dispense Refill   Accu-Chek Softclix Lancets lancets USE TO TEST BLOOD SUGAR TWICE DAILY AS DIRECTED 100 each 12   albuterol  (VENTOLIN  HFA) 108 (90 Base) MCG/ACT inhaler USE 2 INHALATIONS BY MOUTH  EVERY 4 TO 6 HOURS AS  NEEDED 51 g 3   albuterol  (VENTOLIN  HFA) 108 (90 Base) MCG/ACT inhaler Inhale 1-2 puffs into the lungs every 6 (six) hours as needed for wheezing or shortness of breath. 1 each 0   alendronate  (FOSAMAX ) 70 MG tablet TAKE 1 TABLET(70 MG) BY MOUTH 1 TIME A WEEK 12 tablet 3   azelastine  (ASTELIN ) 0.1 % nasal spray Use 1-2 puffs in each nostril once or twice daily 30 mL 12   blood glucose meter kit and supplies KIT Dispense based on patient and insurance preference. Use up to four times daily as directed. 1 each 0   Calcium Citrate-Vitamin D  (CALCIUM + D PO) Take 600 mg by mouth in the morning and at bedtime.     celecoxib  (CELEBREX ) 200 MG capsule TAKE 1 CAPSULE(200 MG) BY MOUTH DAILY 90 capsule 3   cetirizine  (ZYRTEC) 10 MG tablet Take 10 mg by mouth daily as needed for allergies.     cholecalciferol  (VITAMIN D3) 25 MCG (1000 UNIT) tablet Take 1,000 Units by mouth daily.     clotrimazole -betamethasone  (LOTRISONE ) cream Apply 1 Application topically daily. 30 g 0   cyclobenzaprine  (FLEXERIL ) 10 MG tablet TAKE 1 TABLET BY MOUTH THREE TIMES DAILY AS NEEDED FOR HIP CONCERNS 30 tablet 2   diclofenac Sodium (VOLTAREN) 1 % GEL Apply topically daily as needed (Pain).     ezetimibe  (ZETIA ) 10 MG tablet Take 1 tablet (10 mg total) by mouth daily. 90 tablet 1   fenofibrate  160 MG tablet TAKE 1 TABLET(160 MG) BY MOUTH DAILY 90 tablet 2   FEROSUL 325 (65 Fe) MG tablet TAKE 1 TABLET(325 MG) BY MOUTH DAILY WITH BREAKFAST 90 tablet 1   fluconazole  (DIFLUCAN ) 150 MG tablet Take 1 tablet (150 mg total) by mouth daily. May repeat in 3 days if needed. 2 tablet 0   glucose blood test strip Use to check sugars twice daily 100 each 12   Lidocaine  4 % PTCH Apply 1 patch topically as needed (back pain).     metFORMIN  (GLUCOPHAGE -XR) 500 MG 24 hr tablet Take 1 tablet with morning meal, 1 tablet with lunch and 2 tablets with evening meal. 360 tablet 3   methocarbamol  (ROBAXIN ) 500 MG tablet TAKE 1 TABLET(500 MG) BY MOUTH EVERY 6 HOURS AS NEEDED FOR MUSCLE SPASMS 45 tablet 1   mirabegron  ER (MYRBETRIQ ) 50 MG TB24 tablet TAKE 1 TABLET(50 MG) BY MOUTH DAILY 90 tablet 3   Multiple Vitamin (MULTIVITAMIN WITH MINERALS) TABS tablet Take 1 tablet by mouth daily. Centrum One a day - 50+     Multiple Vitamins-Minerals (PRESERVISION AREDS 2+MULTI VIT PO) Take 1 tablet by mouth in the morning and at bedtime.  mupirocin  ointment (BACTROBAN ) 2 % Apply 1 Application topically 3 (three) times daily. 30 g 3   Polyethyl Glycol-Propyl Glycol (SYSTANE OP) Place 1 drop into both eyes daily as needed (dry eyes).     polyethylene glycol (MIRALAX  / GLYCOLAX ) packet Take 17 g by mouth daily as needed for mild constipation.      pregabalin  (LYRICA )  100 MG capsule TAKE 1 CAPSULE BY MOUTH THREE TIMES DAILY 270 capsule 1   Trolamine Salicylate (ASPERCREME EX) Apply 1 application topically daily as needed (back pain).     VASCEPA  1 g capsule TAKE 2 CAPSULES(2 GRAMS) BY MOUTH TWICE DAILY 360 capsule 3   albuterol  (PROVENTIL ) (2.5 MG/3ML) 0.083% nebulizer solution Take 3 mLs (2.5 mg total) by nebulization every 6 (six) hours as needed for wheezing or shortness of breath. (Patient not taking: Reported on 03/04/2024) 75 mL 12   traMADol  (ULTRAM ) 50 MG tablet Take 1 tablet (50 mg total) by mouth every 6 (six) hours as needed. (Patient not taking: Reported on 03/04/2024) 30 tablet 0   aspirin  EC 81 MG tablet Take 81 mg by mouth daily. (Patient not taking: Reported on 03/04/2024)     azithromycin  (ZITHROMAX ) 250 MG tablet Five hundred milligrams azithromycin  on day 1 followed by 250 mg daily azithromycin  x 4 days for total 5 days (Patient not taking: Reported on 02/20/2024) 6 tablet 1   No facility-administered medications prior to visit.    Allergies  Allergen Reactions   Statins Other (See Comments)    Myalgias and muscle weakness   Jardiance  [Empagliflozin ] Rash    Review of Systems  Constitutional:  Negative for fever and malaise/fatigue.  HENT:  Negative for congestion.   Eyes:  Negative for blurred vision.  Respiratory:  Negative for cough and shortness of breath.   Cardiovascular:  Negative for chest pain, palpitations and leg swelling.  Gastrointestinal:  Negative for abdominal pain, blood in stool, nausea and vomiting.  Genitourinary:  Negative for dysuria and frequency.  Musculoskeletal:  Negative for back pain and falls.  Skin:  Negative for rash.  Neurological:  Negative for dizziness, loss of consciousness and headaches.  Endo/Heme/Allergies:  Negative for environmental allergies.  Psychiatric/Behavioral:  Negative for depression. The patient is not nervous/anxious.        Objective:    Physical Exam Vitals and nursing note  reviewed.  Constitutional:      General: She is not in acute distress.    Appearance: Normal appearance. She is well-developed.  HENT:     Head: Normocephalic and atraumatic.  Eyes:     General: No scleral icterus.       Right eye: No discharge.        Left eye: No discharge.  Cardiovascular:     Rate and Rhythm: Normal rate and regular rhythm.     Heart sounds: Murmur heard.  Pulmonary:     Effort: Pulmonary effort is normal. No respiratory distress.     Breath sounds: Normal breath sounds.  Musculoskeletal:        General: Normal range of motion.     Cervical back: Normal range of motion and neck supple.     Right lower leg: No edema.     Left lower leg: No edema.  Skin:    General: Skin is warm and dry.  Neurological:     Mental Status: She is alert and oriented to person, place, and time.  Psychiatric:        Mood and Affect: Mood normal.  Behavior: Behavior normal.        Thought Content: Thought content normal.        Judgment: Judgment normal.     BP 134/60 (BP Location: Left Arm, Patient Position: Sitting, Cuff Size: Normal)   Pulse (!) 57   Temp 98 F (36.7 C) (Oral)   Resp 18   Ht 5' 3.5 (1.613 m)   Wt 156 lb 9.6 oz (71 kg)   SpO2 95%   BMI 27.31 kg/m  Wt Readings from Last 3 Encounters:  03/04/24 156 lb 9.6 oz (71 kg)  02/20/24 156 lb (70.8 kg)  02/05/24 151 lb 9.6 oz (68.8 kg)    Diabetic Foot Exam - Simple   No data filed    Lab Results  Component Value Date   WBC 6.5 08/25/2023   HGB 11.4 (L) 08/25/2023   HCT 35.3 (L) 08/25/2023   PLT 232 08/25/2023   GLUCOSE 185 (H) 12/25/2023   CHOL 143 10/30/2023   TRIG 204.0 (H) 10/30/2023   HDL 49.10 10/30/2023   LDLDIRECT 54 07/15/2021   LDLCALC 53 10/30/2023   ALT 20 12/25/2023   AST 19 12/25/2023   NA 140 12/25/2023   K 4.6 12/25/2023   CL 104 12/25/2023   CREATININE 1.15 12/25/2023   BUN 21 12/25/2023   CO2 28 12/25/2023   TSH 5.38 (H) 06/03/2016   INR 1.1 01/15/2022   HGBA1C 7.7  (H) 12/25/2023   MICROALBUR 0.3 02/07/2020    Lab Results  Component Value Date   TSH 5.38 (H) 06/03/2016   Lab Results  Component Value Date   WBC 6.5 08/25/2023   HGB 11.4 (L) 08/25/2023   HCT 35.3 (L) 08/25/2023   MCV 87.8 08/25/2023   PLT 232 08/25/2023   Lab Results  Component Value Date   NA 140 12/25/2023   K 4.6 12/25/2023   CO2 28 12/25/2023   GLUCOSE 185 (H) 12/25/2023   BUN 21 12/25/2023   CREATININE 1.15 12/25/2023   BILITOT 0.4 12/25/2023   ALKPHOS 38 (L) 12/25/2023   AST 19 12/25/2023   ALT 20 12/25/2023   PROT 6.3 12/25/2023   ALBUMIN 4.2 12/25/2023   CALCIUM 10.5 12/25/2023   ANIONGAP 7 08/25/2023   GFR 44.24 (L) 12/25/2023   Lab Results  Component Value Date   CHOL 143 10/30/2023   Lab Results  Component Value Date   HDL 49.10 10/30/2023   Lab Results  Component Value Date   LDLCALC 53 10/30/2023   Lab Results  Component Value Date   TRIG 204.0 (H) 10/30/2023   Lab Results  Component Value Date   CHOLHDL 3 10/30/2023   Lab Results  Component Value Date   HGBA1C 7.7 (H) 12/25/2023       Assessment & Plan:  Type 2 diabetes mellitus with diabetic neuropathy, unspecified whether long term insulin  use (HCC) -     Comprehensive metabolic panel with GFR -     Hemoglobin A1c -     Microalbumin / creatinine urine ratio  Hyperlipidemia associated with type 2 diabetes mellitus (HCC) Assessment & Plan: Encourage heart healthy diet such as MIND or DASH diet, increase exercise, avoid trans fats, simple carbohydrates and processed foods, consider a krill or fish or flaxseed oil cap daily.    Orders: -     Lipid panel  Iron deficiency anemia, unspecified iron deficiency anemia type -     CBC with Differential/Platelet -     Comprehensive metabolic panel with GFR  Essential  hypertension Assessment & Plan: Well controlled, no changes to meds. Encouraged heart healthy diet such as the DASH diet and exercise as tolerated.    Orders: -      CBC with Differential/Platelet -     Comprehensive metabolic panel with GFR -     TSH   Assessment and Plan Assessment & Plan Type 2 diabetes mellitus with complications, including diabetic neuropathy   Blood sugar levels are elevated due to increased food intake. She previously experienced an adverse reaction to Jardiance , presenting with golden yellow urine and rash. There are no current urinary symptoms.  Chronic kidney disease, unspecified stage   She has had adverse reactions to medications affecting kidney function, including Jardiance  and prednisone . No current urinary symptoms are present. Obtain a urine sample to check for proteinuria and monitor kidney function.  Essential hypertension   Blood pressure readings have been variable, with a recent reading of 95/unknown. There are no significant concerns during the visit.  General Health Maintenance   She recently received flu and COVID vaccinations. An eye examination was postponed due to personal commitments and is rescheduled for December. Vision changes have been noted, indicating a potential need for glasses. Attend the rescheduled eye examination in December.   Zakary Kimura R Lowne Chase, DO

## 2024-03-04 NOTE — Assessment & Plan Note (Signed)
 Encourage heart healthy diet such as MIND or DASH diet, increase exercise, avoid trans fats, simple carbohydrates and processed foods, consider a krill or fish or flaxseed oil cap daily.

## 2024-03-04 NOTE — Assessment & Plan Note (Signed)
 Well controlled, no changes to meds. Encouraged heart healthy diet such as the DASH diet and exercise as tolerated.

## 2024-03-05 LAB — LIPID PANEL
Cholesterol: 149 mg/dL (ref 0–200)
HDL: 55 mg/dL (ref >39.00)
LDL Cholesterol: 73 mg/dL (ref 0–99)
NonHDL: 94.05
Total CHOL/HDL Ratio: 3
Triglycerides: 105 mg/dL (ref 0.0–149.0)
VLDL: 21 mg/dL (ref 0.0–40.0)

## 2024-03-05 LAB — MICROALBUMIN / CREATININE URINE RATIO
Creatinine,U: 78.6 mg/dL
Microalb Creat Ratio: 23.7 mg/g (ref 0.0–30.0)
Microalb, Ur: 1.9 mg/dL (ref 0.0–1.9)

## 2024-03-05 LAB — COMPREHENSIVE METABOLIC PANEL WITH GFR
ALT: 20 U/L (ref 0–35)
AST: 19 U/L (ref 0–37)
Albumin: 4.1 g/dL (ref 3.5–5.2)
Alkaline Phosphatase: 35 U/L — ABNORMAL LOW (ref 39–117)
BUN: 19 mg/dL (ref 6–23)
CO2: 25 meq/L (ref 19–32)
Calcium: 9.5 mg/dL (ref 8.4–10.5)
Chloride: 105 meq/L (ref 96–112)
Creatinine, Ser: 0.96 mg/dL (ref 0.40–1.20)
GFR: 54.87 mL/min — ABNORMAL LOW (ref >60.00)
Glucose, Bld: 183 mg/dL — ABNORMAL HIGH (ref 70–99)
Potassium: 4.3 meq/L (ref 3.5–5.1)
Sodium: 140 meq/L (ref 135–145)
Total Bilirubin: 0.4 mg/dL (ref 0.2–1.2)
Total Protein: 6.1 g/dL (ref 6.0–8.3)

## 2024-03-10 ENCOUNTER — Ambulatory Visit: Payer: Self-pay | Admitting: Family Medicine

## 2024-03-10 DIAGNOSIS — E1169 Type 2 diabetes mellitus with other specified complication: Secondary | ICD-10-CM

## 2024-03-10 DIAGNOSIS — E1140 Type 2 diabetes mellitus with diabetic neuropathy, unspecified: Secondary | ICD-10-CM

## 2024-03-12 DIAGNOSIS — G5702 Lesion of sciatic nerve, left lower limb: Secondary | ICD-10-CM | POA: Diagnosis not present

## 2024-03-15 ENCOUNTER — Telehealth: Payer: Self-pay

## 2024-03-15 NOTE — Telephone Encounter (Signed)
 Copied from CRM #[id number]. Topic: Clinical - Lab/Test Results >> Mar 15, 2024 10:44 AM Robinson H wrote: Reason for CRM: Patient returned call to [facility], agent relayed message from provider as stated patient states she already takes the metformin  twice a day, patient didn't really seem that sure.   [place] [phone]

## 2024-03-19 NOTE — Telephone Encounter (Signed)
 Jardiance  is on patients allergy  list. Please advise

## 2024-03-28 ENCOUNTER — Ambulatory Visit

## 2024-04-08 ENCOUNTER — Ambulatory Visit

## 2024-04-08 VITALS — BP 118/62 | HR 59 | Temp 97.7°F | Ht 63.5 in | Wt 159.8 lb

## 2024-04-08 DIAGNOSIS — J4541 Moderate persistent asthma with (acute) exacerbation: Secondary | ICD-10-CM | POA: Diagnosis not present

## 2024-04-08 LAB — PULMONARY FUNCTION TEST
DL/VA % pred: 84 %
DL/VA: 3.45 ml/min/mmHg/L
DLCO cor % pred: 67 %
DLCO cor: 12.36 ml/min/mmHg
DLCO unc % pred: 63 %
DLCO unc: 11.53 ml/min/mmHg
FEF 25-75 Pre: 1.27 L/s
FEF2575-%Pred-Pre: 103 %
FEV1-%Pred-Pre: 86 %
FEV1-Pre: 1.54 L
FEV1FVC-%Pred-Pre: 101 %
FEV6-%Pred-Pre: 90 %
FEV6-Pre: 2.06 L
FEV6FVC-%Pred-Pre: 106 %
FVC-%Pred-Pre: 85 %
FVC-Pre: 2.07 L
Pre FEV1/FVC ratio: 75 %
Pre FEV6/FVC Ratio: 100 %

## 2024-04-08 NOTE — Progress Notes (Signed)
Spiro/DLCO performed today. 

## 2024-04-08 NOTE — Progress Notes (Signed)
 Discussed the use of AI scribe software for clinical note transcription with the patient, who gave verbal consent to proceed.  History of Present Illness [name] is an 83 year old female with asthma who presents for evaluation of her asthma management.  Diagnosed with asthma at age 75, she initially used Advair  twice daily, which was effective. She switched to Breo once daily last year due to insurance changes and finds it effective, though she experiences occasional nighttime coughing, which was not present with Advair .  She uses a rescue inhaler occasionally, particularly during recent coughing spells. She has been using Breo since the beginning of the year and reports overall stability in her asthma symptoms, with the last significant flare at the end of last year. She experienced bronchitis about a month ago, treated with prednisone .  She experiences shortness of breath occasionally, particularly in damp environments, which she identifies as a trigger for her asthma. She tries to walk in the park regularly and notes that her asthma does not cause significant wheezing or shortness of breath during these activities. She uses her rescue inhaler a couple of times a week and does not wake up at night due to shortness of breath.  Her last Feno was 11 on 01/2024   Asthma Control Test ACT Total Score  02/05/2024  3:06 PM 18  05/26/2022 10:14 AM 24  05/26/2021  9:54 AM 15   Allergies  Allergen Reactions   Statins Other (See Comments)    Myalgias and muscle weakness   Jardiance  [Empagliflozin ] Rash    PFT 04/08/24: normal spirometry with moderate reduction of DLCO (Z-2.6), hemoglobin 11    Latest Ref Rng & Units 04/08/2024   10:28 AM  PFT Results  FVC-Pre L 2.07  P  FVC-Predicted Pre % 85  P  Pre FEV1/FVC % % 75  P  FEV1-Pre L 1.54  P  FEV1-Predicted Pre % 86  P  DLCO uncorrected ml/min/mmHg 11.53  P  DLCO UNC% % 63  P  DLCO corrected ml/min/mmHg 12.36  P  DLCO COR %Predicted  % 67  P  DLVA Predicted % 84  P    P Preliminary result    Latest Reference Range & Units 05/14/07 11:59 06/16/08 10:38 09/16/09 10:21 11/08/10 09:03 08/09/11 09:29 04/05/13 08:54 06/02/14 09:26 11/24/15 15:12 10/17/16 16:04 12/05/16 11:46 12/11/17 10:05 12/28/17 13:28 03/30/18 13:14 07/19/18 11:11 01/28/19 14:25 03/15/21 14:18 05/07/21 16:15 09/09/21 14:03 09/16/21 12:19 09/19/21 19:36 12/13/21 09:34 01/15/22 09:00 06/16/22 10:27 12/15/22 11:10 06/22/23 11:22 08/25/23 11:43  Eosinophils Absolute 0.0 - 0.5 K/uL 0.1 0.1 0.2 0.1 0.1 0.1 0.1 0.1 0.1 0.1 0.2 0.1 0.1 0.2 0.1 0.2 202 0.2 0.1 0.0 0.1 0.2 0.2 0.2 0.2 0.3     Allergies  Allergen Reactions   Statins Other (See Comments)    Myalgias and muscle weakness   Jardiance  [Empagliflozin ] Rash      has a past medical history of Anemia, Arthritis, Asthma, Basal cell cancer, Chronic kidney disease (1963), Complication of anesthesia, Diabetes mellitus (2010), DVT (deep venous thrombosis) (HCC) (2006), GERD (gastroesophageal reflux disease), Granulomatous lung disease (HCC) (2002), Hyperlipidemia, Hypertension (2004), Paralysis (HCC) (2006), and PTE (pulmonary thromboembolism) (HCC) (2006).   reports that she has never smoked. She has been exposed to tobacco smoke. She has never used smokeless tobacco.  Past Surgical History:  Procedure Laterality Date   ABDOMINAL EXPOSURE N/A 08/12/2016   Procedure: ABDOMINAL EXPOSURE;  Surgeon: Krystal JULIANNA Doing, MD;  Location: [facility] OR;  Service: Vascular;  Laterality: N/A;  ANTERIOR LAT LUMBAR FUSION N/A 08/12/2016   Procedure: LUMBAR TWO-THREE, LUMBAR THREE-FOUR, LUMBAR FOUR-FIVE  ANTEROLATERAL LUMBAR INTERBODY FUSION;  Surgeon: Fairy Levels, MD;  Location: [facility] OR;  Service: Neurosurgery;  Laterality: N/A;  L2-3 L3-4 L4-5 Anterolateral lumbar interbody fusion   ANTERIOR LUMBAR FUSION N/A 08/12/2016   Procedure: Lumbar Five-Sacral One Anterior lumbar interbody fusion with Dr. Krystal Early to assist;  Surgeon: Fairy Levels, MD;  Location: [facility] OR;  Service: Neurosurgery;  Laterality: N/A;  L5-S1 Anterior lumbar interbody fusion with Dr. Krystal Early to assist   BUNIONECTOMY     CATARACT EXTRACTION Right 12/31/2018   CATARACT EXTRACTION Left 12/17/2018   CERVICAL FUSION  2006   Dr Louis, NS;post op hematoma & cns leak & urinary retention   COLONOSCOPY  1992 & 2002   negative   epidural steroids  2006   cervical spine   LUMBAR PERCUTANEOUS PEDICLE SCREW 4 LEVEL N/A 08/12/2016   Procedure: LUMBAR TWO-SACRAL ONE Percuataneous Pedicle Screws;  Surgeon: Fairy Levels, MD;  Location: [facility] OR;  Service: Neurosurgery;  Laterality: N/A;   ROTATOR CUFF REPAIR  2009   Right   SEPTOPLASTY     TONSILLECTOMY  83 years old   TOTAL HIP ARTHROPLASTY  06/01/2012   Procedure: TOTAL HIP ARTHROPLASTY ANTERIOR APPROACH;  Surgeon: Lonni CINDERELLA Poli, MD;  Location: [facility] ORS;  Service: Orthopedics;  Laterality: Left;  Left Total Hip Arthroplasty   TOTAL HIP ARTHROPLASTY Right 02/23/2018   Procedure: RIGHT TOTAL HIP ARTHROPLASTY ANTERIOR APPROACH;  Surgeon: Poli Lonni CINDERELLA, MD;  Location: [facility] ORS;  Service: Orthopedics;  Laterality: Right;   TUBAL LIGATION      Allergies  Allergen Reactions   Statins Other (See Comments)    Myalgias and muscle weakness   Jardiance  [Empagliflozin ] Rash    Immunization History  Administered Date(s) Administered   Fluad  Quad(high Dose 65+) 01/28/2019, 03/31/2020, 03/15/2021, 03/01/2022, 02/29/2024   INFLUENZA, HIGH DOSE SEASONAL PF 03/19/2013, 03/21/2016   Influenza Inj Mdck Quad With Preservative 03/23/2018   Influenza Split 03/15/2012, 03/12/2023   Influenza Whole 02/27/2009   Influenza,inj,Quad PF,6+ Mos 03/13/2014, 02/16/2015   Influenza-Unspecified 03/15/2012, 01/28/2014, 03/13/2014, 02/16/2015, 02/14/2017   Moderna SARS-COV2 Booster Vaccination 04/14/2020, 12/04/2020   Moderna Sars-Covid-2 Vaccination 06/11/2019, 07/09/2019, 03/04/2023   Pfizer(Comirnaty )Fall Seasonal Vaccine 12  years and older 06/01/2022   Pneumococcal Conjugate-13 05/16/2013   Pneumococcal Polysaccharide-23 09/29/2014, 02/07/2020   Respiratory Syncytial Virus Vaccine ,Recomb Aduvanted(Arexvy ) 08/08/2023   Tdap 10/28/2010, 04/30/2021   Unspecified SARS-COV-2 Vaccination 02/29/2024   Zoster Recombinant(Shingrix) 02/11/2019, 04/12/2019   Zoster, Live 05/12/2014    Family History  Problem Relation Age of Onset   COPD Father        emphysema   Cancer Mother        cns cancer   Diabetes Sister        TWIN sister ; also Fibromyalgia ; S/P stent 2004   Heart disease Sister        stents @ 87 & 59   Stroke Maternal Grandmother        in  late 36s   Transient ischemic attack Paternal Aunt      Current Outpatient Medications:    Accu-Chek Softclix Lancets lancets, USE TO TEST BLOOD SUGAR TWICE DAILY AS DIRECTED, Disp: 100 each, Rfl: 12   albuterol  (PROVENTIL ) (2.5 MG/3ML) 0.083% nebulizer solution, Take 3 mLs (2.5 mg total) by nebulization every 6 (six) hours as needed for wheezing or shortness of breath. (Patient not taking: Reported on 03/04/2024), Disp: 75 mL,  Rfl: 12   albuterol  (VENTOLIN  HFA) 108 (90 Base) MCG/ACT inhaler, USE 2 INHALATIONS BY MOUTH  EVERY 4 TO 6 HOURS AS  NEEDED, Disp: 51 g, Rfl: 3   albuterol  (VENTOLIN  HFA) 108 (90 Base) MCG/ACT inhaler, Inhale 1-2 puffs into the lungs every 6 (six) hours as needed for wheezing or shortness of breath., Disp: 1 each, Rfl: 0   alendronate  (FOSAMAX ) 70 MG tablet, TAKE 1 TABLET(70 MG) BY MOUTH 1 TIME A WEEK, Disp: 12 tablet, Rfl: 3   azelastine  (ASTELIN ) 0.1 % nasal spray, Use 1-2 puffs in each nostril once or twice daily, Disp: 30 mL, Rfl: 12   blood glucose meter kit and supplies KIT, Dispense based on patient and insurance preference. Use up to four times daily as directed., Disp: 1 each, Rfl: 0   Calcium Citrate-Vitamin D  (CALCIUM + D PO), Take 600 mg by mouth in the morning and at bedtime., Disp: , Rfl:    celecoxib  (CELEBREX ) 200 MG capsule,  TAKE 1 CAPSULE(200 MG) BY MOUTH DAILY, Disp: 90 capsule, Rfl: 3   cetirizine (ZYRTEC) 10 MG tablet, Take 10 mg by mouth daily as needed for allergies., Disp: , Rfl:    cholecalciferol  (VITAMIN D3) 25 MCG (1000 UNIT) tablet, Take 1,000 Units by mouth daily., Disp: , Rfl:    clotrimazole -betamethasone  (LOTRISONE ) cream, Apply 1 Application topically daily., Disp: 30 g, Rfl: 0   cyclobenzaprine  (FLEXERIL ) 10 MG tablet, TAKE 1 TABLET BY MOUTH THREE TIMES DAILY AS NEEDED FOR HIP CONCERNS, Disp: 30 tablet, Rfl: 2   diclofenac Sodium (VOLTAREN) 1 % GEL, Apply topically daily as needed (Pain)., Disp: , Rfl:    ezetimibe  (ZETIA ) 10 MG tablet, Take 1 tablet (10 mg total) by mouth daily., Disp: 90 tablet, Rfl: 1   fenofibrate  160 MG tablet, TAKE 1 TABLET(160 MG) BY MOUTH DAILY, Disp: 90 tablet, Rfl: 2   FEROSUL 325 (65 Fe) MG tablet, TAKE 1 TABLET(325 MG) BY MOUTH DAILY WITH BREAKFAST, Disp: 90 tablet, Rfl: 1   fluconazole  (DIFLUCAN ) 150 MG tablet, Take 1 tablet (150 mg total) by mouth daily. May repeat in 3 days if needed., Disp: 2 tablet, Rfl: 0   glucose blood test strip, Use to check sugars twice daily, Disp: 100 each, Rfl: 12   Lidocaine  4 % PTCH, Apply 1 patch topically as needed (back pain)., Disp: , Rfl:    metFORMIN  (GLUCOPHAGE -XR) 500 MG 24 hr tablet, Take 1 tablet with morning meal, 1 tablet with lunch and 2 tablets with evening meal., Disp: 360 tablet, Rfl: 3   methocarbamol  (ROBAXIN ) 500 MG tablet, TAKE 1 TABLET(500 MG) BY MOUTH EVERY 6 HOURS AS NEEDED FOR MUSCLE SPASMS, Disp: 45 tablet, Rfl: 1   mirabegron  ER (MYRBETRIQ ) 50 MG TB24 tablet, TAKE 1 TABLET(50 MG) BY MOUTH DAILY, Disp: 90 tablet, Rfl: 3   Multiple Vitamin (MULTIVITAMIN WITH MINERALS) TABS tablet, Take 1 tablet by mouth daily. Centrum One a day - 50+, Disp: , Rfl:    Multiple Vitamins-Minerals (PRESERVISION AREDS 2+MULTI VIT PO), Take 1 tablet by mouth in the morning and at bedtime., Disp: , Rfl:    mupirocin  ointment (BACTROBAN ) 2 %,  Apply 1 Application topically 3 (three) times daily., Disp: 30 g, Rfl: 3   Polyethyl Glycol-Propyl Glycol (SYSTANE OP), Place 1 drop into both eyes daily as needed (dry eyes)., Disp: , Rfl:    polyethylene glycol (MIRALAX  / GLYCOLAX ) packet, Take 17 g by mouth daily as needed for mild constipation. , Disp: , Rfl:  pregabalin  (LYRICA ) 100 MG capsule, TAKE 1 CAPSULE BY MOUTH THREE TIMES DAILY, Disp: 270 capsule, Rfl: 1   traMADol  (ULTRAM ) 50 MG tablet, Take 1 tablet (50 mg total) by mouth every 6 (six) hours as needed. (Patient not taking: Reported on 03/04/2024), Disp: 30 tablet, Rfl: 0   Trolamine Salicylate (ASPERCREME EX), Apply 1 application topically daily as needed (back pain)., Disp: , Rfl:    VASCEPA  1 g capsule, TAKE 2 CAPSULES(2 GRAMS) BY MOUTH TWICE DAILY, Disp: 360 capsule, Rfl: 3      Objective:   Vitals:   04/08/24 1127  BP: 118/62  Pulse: (!) 59  Temp: 97.7 F (36.5 C)  TempSrc: Oral  SpO2: 97%  Weight: 159 lb 12.8 oz (72.5 kg)  Height: 5' 3.5 (1.613 m)     Estimated body mass index is 27.86 kg/m as calculated from the following:   Height as of this encounter: 5' 3.5 (1.613 m).   Weight as of this encounter: 159 lb 12.8 oz (72.5 kg).  Filed Weights   04/08/24 1127  Weight: 159 lb 12.8 oz (72.5 kg)    Physical Exam General: No distress. Look well.  Neuro: Alert and Oriented x 3. GCS 15. Speech normal Psych: Pleasant Resp:  normal breath sounds, no wheezing, no resp distress CVS: Normal heart sounds. No murmurs HEENT: Normal upper airway. PEERL +. No post nasal drip    Assessment/   Assessment & Plan Moderate persistent asthma, uncomplicated Asthma well-controlled with Breo. No significant wheezing or shortness of breath. Last flare last year in 2024. Recent bronchitis treated with prednisone . His PFT today showed normal spirometry, moderate reduction of DLCO (z-2.6). Hemoglobin 11.4 Plan: - Continue Breo 100. - Monitor for increased symptoms, I will  consider to increase dose vs triple therapy - Contact clinic if symptoms worsen for potential inhaler change to Breztri or Trelegy. - Follow-up in four months.  Return in about 4 months (around 08/06/2024).  Marny Patch, MD Pulmonary and Critical Care Medicine [place] [facility]

## 2024-04-08 NOTE — Patient Instructions (Signed)
Spiro/DLCO performed today. 

## 2024-04-08 NOTE — Patient Instructions (Addendum)
 Dear [name]:  It seems your asthma is well controlled this year, so I don't think we need to change any therapy at this time. However if you start having symptoms, I will increase your dose or change to a different inhaler.   I will see you 4 months, if you have symptoms please call the pulmonary clinic to get an appointment sooner or send me a mychart message.

## 2024-04-09 ENCOUNTER — Other Ambulatory Visit: Payer: Self-pay | Admitting: Internal Medicine

## 2024-04-10 ENCOUNTER — Other Ambulatory Visit: Payer: Self-pay | Admitting: Family Medicine

## 2024-04-10 NOTE — Telephone Encounter (Signed)
 LMOM informing Pt to d/c alendronate / Fosamax  per PCP instructions and instructions to continue otc calcium and vitamin d . Fosamax  d/c from med list.

## 2024-04-10 NOTE — Addendum Note (Signed)
 Addended by: [place]ah Devin D on: 04/10/2024 03:02 PM   Modules accepted: Orders

## 2024-04-10 NOTE — Telephone Encounter (Signed)
 [name], DO to Me  Elouise Powell HERO, CMA (Selected Message)     04/10/24  2:49 PM Pt has been on med >5 yrs------ needs to take break but cont calcium and vita d3  weight bearing exercise also helpful

## 2024-04-10 NOTE — Telephone Encounter (Signed)
 Looks like Pt has been on Fosamax  since 2018, unsure if she had her 5 year holiday. Please advise?

## 2024-04-11 ENCOUNTER — Ambulatory Visit

## 2024-04-11 VITALS — Ht 63.5 in | Wt 157.0 lb

## 2024-04-11 DIAGNOSIS — Z0000 Encounter for general adult medical examination without abnormal findings: Secondary | ICD-10-CM

## 2024-04-11 NOTE — Patient Instructions (Addendum)
 Ms. Subramanian,  Thank you for taking the time for your Medicare Wellness Visit. I appreciate your continued commitment to your health goals. Please review the care plan we discussed, and feel free to reach out if I can assist you further.  Please note that Annual Wellness Visits do not include a physical exam. Some assessments may be limited, especially if the visit was conducted virtually. If needed, we may recommend an in-person follow-up with your provider.  Ongoing Care Seeing your primary care provider every 3 to 6 months helps us  monitor your health and provide consistent, personalized care.   Referrals If a referral was made during today's visit and you haven't received any updates within two weeks, please contact the referred provider directly to check on the status.  Recommended Screenings:  Health Maintenance  Topic Date Due   Eye exam for diabetics  01/18/2024   COVID-19 Vaccine (6 - Mixed Product risk 2025-26 season) 08/29/2024   Hemoglobin A1C  09/02/2024   Complete foot exam   12/24/2024   Breast Cancer Screening  01/01/2025   Yearly kidney function blood test for diabetes  03/04/2025   Yearly kidney health urinalysis for diabetes  03/04/2025   Medicare Annual Wellness Visit  04/11/2025   DTaP/Tdap/Td vaccine (3 - Td or Tdap) 05/01/2031   Pneumococcal Vaccine for age over 42  Completed   Flu Shot  Completed   DEXA scan (bone density measurement)  Completed   Zoster (Shingles) Vaccine  Completed   Meningitis B Vaccine  Aged Out   Hepatitis C Screening  Discontinued       04/11/2024   12:44 PM  Advanced Directives  Does Patient Have a Medical Advance Directive? Yes  Type of Estate Agent of [place];Living will  Does patient want to make changes to medical advance directive? No - Patient declined  Copy of Healthcare Power of Attorney in Chart? Yes - validated most recent copy scanned in chart (See row information)    Vision: Annual vision  screenings are recommended for early detection of glaucoma, cataracts, and diabetic retinopathy. These exams can also reveal signs of chronic conditions such as diabetes and high blood pressure.  Dental: Annual dental screenings help detect early signs of oral cancer, gum disease, and other conditions linked to overall health, including heart disease and diabetes.  Please see the attached documents for additional preventive care recommendations.

## 2024-04-11 NOTE — Progress Notes (Signed)
 Chief Complaint  Patient presents with   Medicare Wellness     Subjective:   [name] is a 83 y.o. female who presents for a Medicare Annual Wellness Visit.  Allergies (verified) Statins and Jardiance  [empagliflozin ]   History: Past Medical History:  Diagnosis Date   Anemia    Arthritis    Asthma    adult onset   Basal cell cancer    LUE; Porokeratosis also   Chronic kidney disease 1963   strep in kidney due to strep throat-hospitalized 10 days   Complication of anesthesia    small trachea   Diabetes mellitus 2010   A1c 6.7%   DVT (deep venous thrombosis) (HCC) 2006   post immobilization post cns surgery   GERD (gastroesophageal reflux disease)    very mild   Granulomatous lung disease (HCC) 2002   incidental Xray finding   Hyperlipidemia    Hypertension 2004   Hypertensive response on Stress Test   Paralysis (HCC) 2006   post cervical fusion with spinal sac tear  with hematoma    PTE (pulmonary thromboembolism) (HCC) 2006   Past Surgical History:  Procedure Laterality Date   ABDOMINAL EXPOSURE N/A 08/12/2016   Procedure: ABDOMINAL EXPOSURE;  Surgeon: Krystal JULIANNA Doing, MD;  Location: [facility] OR;  Service: Vascular;  Laterality: N/A;   ANTERIOR LAT LUMBAR FUSION N/A 08/12/2016   Procedure: LUMBAR TWO-THREE, LUMBAR THREE-FOUR, LUMBAR FOUR-FIVE  ANTEROLATERAL LUMBAR INTERBODY FUSION;  Surgeon: Fairy Levels, MD;  Location: [facility] OR;  Service: Neurosurgery;  Laterality: N/A;  L2-3 L3-4 L4-5 Anterolateral lumbar interbody fusion   ANTERIOR LUMBAR FUSION N/A 08/12/2016   Procedure: Lumbar Five-Sacral One Anterior lumbar interbody fusion with Dr. Krystal Early to assist;  Surgeon: Fairy Levels, MD;  Location: [facility] OR;  Service: Neurosurgery;  Laterality: N/A;  L5-S1 Anterior lumbar interbody fusion with Dr. Krystal Early to assist   BUNIONECTOMY     CATARACT EXTRACTION Right 12/31/2018   CATARACT EXTRACTION Left 12/17/2018   CERVICAL FUSION  2006   Dr Louis, [place];post op hematoma & cns leak &  urinary retention   COLONOSCOPY  1992 & 2002   negative   epidural steroids  2006   cervical spine   LUMBAR PERCUTANEOUS PEDICLE SCREW 4 LEVEL N/A 08/12/2016   Procedure: LUMBAR TWO-SACRAL ONE Percuataneous Pedicle Screws;  Surgeon: Fairy Levels, MD;  Location: [facility] OR;  Service: Neurosurgery;  Laterality: N/A;   ROTATOR CUFF REPAIR  2009   Right   SEPTOPLASTY     TONSILLECTOMY  83 years old   TOTAL HIP ARTHROPLASTY  06/01/2012   Procedure: TOTAL HIP ARTHROPLASTY ANTERIOR APPROACH;  Surgeon: Lonni CINDERELLA Poli, MD;  Location: [facility] ORS;  Service: Orthopedics;  Laterality: Left;  Left Total Hip Arthroplasty   TOTAL HIP ARTHROPLASTY Right 02/23/2018   Procedure: RIGHT TOTAL HIP ARTHROPLASTY ANTERIOR APPROACH;  Surgeon: Poli Lonni CINDERELLA, MD;  Location: [facility] ORS;  Service: Orthopedics;  Laterality: Right;   TUBAL LIGATION     Family History  Problem Relation Age of Onset   COPD Father        emphysema   Cancer Mother        cns cancer   Diabetes Sister        TWIN sister ; also Fibromyalgia ; S/P stent 2004   Heart disease Sister        stents @ 55 & 37   Stroke Maternal Grandmother        in  late 85s   Transient  ischemic attack Paternal Aunt    Social History   Occupational History   Not on file  Tobacco Use   Smoking status: Never    Passive exposure: Past   Smokeless tobacco: Never  Vaping Use   Vaping status: Never Used  Substance and Sexual Activity   Alcohol use: Yes    Alcohol/week: 1.0 standard drink of alcohol    Types: 1 Glasses of wine per week    Comment: socially   Drug use: No   Sexual activity: Never   Tobacco Counseling Counseling given: No  SDOH Screenings   Food Insecurity: No Food Insecurity (04/11/2024)  Housing: Unknown (04/11/2024)  Transportation Needs: No Transportation Needs (04/11/2024)  Utilities: Not At Risk (04/11/2024)  Alcohol Screen: Low Risk  (07/14/2022)  Depression (PHQ2-9): Low Risk  (04/11/2024)  Financial Resource Strain:  Low Risk  (10/29/2021)  Physical Activity: Sufficiently Active (04/11/2024)  Social Connections: Moderately Integrated (04/11/2024)  Stress: No Stress Concern Present (04/11/2024)  Tobacco Use: Low Risk  (04/11/2024)  Health Literacy: Adequate Health Literacy (04/11/2024)   See flowsheets for full screening details  Depression Screen PHQ 2 & 9 Depression Scale- Over the past 2 weeks, how often have you been bothered by any of the following problems? Little interest or pleasure in doing things: 0 Feeling down, depressed, or hopeless (PHQ Adolescent also includes...irritable): 0 PHQ-2 Total Score: 0     Goals Addressed               This Visit's Progress     Remain Active (pt-stated)        Continue to exercise.       Visit info / Clinical Intake: Medicare Wellness Visit Type:: Subsequent Annual Wellness Visit Persons participating in visit:: patient Medicare Wellness Visit Mode:: Telephone If telephone:: video declined Because this visit was a virtual/telehealth visit:: pt reported vitals If Telephone or Video please confirm:: I connected with the patient using audio enabled telemedicine application and verified that I am speaking with the correct person using two identifiers Patient Location:: Home Provider Location:: Office Information given by:: patient Interpreter Needed?: No Pre-visit prep was completed: no AWV questionnaire completed by patient prior to visit?: no Living arrangements:: in retirement community Patient's Overall Health Status Rating: very good Typical amount of pain: none Does pain affect daily life?: no Are you currently prescribed opioids?: no  Dietary Habits and Nutritional Risks How many meals a day?: 3 Eats fruit and vegetables daily?: yes Most meals are obtained by: having others provide food (Facility provides meals) In the last 2 weeks, have you had any of the following?: none Diabetic:: (!) yes Any non-healing wounds?: no How often do  you check your BS?: 1 (Daily) Would you like to be referred to a Nutritionist or for Diabetic Management? : no  Functional Status Activities of Daily Living (to include ambulation/medication): Independent Ambulation: Independent with device- listed below Home Assistive Devices/Equipment: Eyeglasses Medication Administration: Independent Home Management: Independent Manage your own finances?: yes Primary transportation is: driving Concerns about vision?: no *vision screening is required for WTM* Concerns about hearing?: no  Fall Screening Falls in the past year?: 0 Number of falls in past year: 1 Was there an injury with Fall?: 0 Fall Risk Category Calculator: 1 Patient Fall Risk Level: Low Fall Risk  Fall Risk Patient at Risk for Falls Due to: No Fall Risks Fall risk Follow up: Falls evaluation completed  Home and Transportation Safety: All rugs have non-skid backing?: N/A, no rugs All stairs  or steps have railings?: yes Grab bars in the bathtub or shower?: yes Have non-skid surface in bathtub or shower?: yes Good home lighting?: yes Regular seat belt use?: yes Hospital stays in the last year:: no  Cognitive Assessment Difficulty concentrating, remembering, or making decisions? : no Will 6CIT or Mini Cog be Completed: no 6CIT or Mini Cog Declined: patient alert, oriented, able to answer questions appropriately and recall recent events  Advance Directives (For Healthcare) Does Patient Have a Medical Advance Directive?: Yes Does patient want to make changes to medical advance directive?: No - Patient declined Type of Advance Directive: Healthcare Power of [place]; Living will Copy of Healthcare Power of Attorney in Chart?: Yes - validated most recent copy scanned in chart (See row information) Copy of Living Will in Chart?: Yes - validated most recent copy scanned in chart (See row information)  Reviewed/Updated  Reviewed/Updated: Reviewed All (Medical, Surgical, Family,  Medications, Allergies, Care Teams, Patient Goals)        Objective:    Today's Vitals   04/11/24 1242  Weight: 157 lb (71.2 kg)  Height: 5' 3.5 (1.613 m)   Body mass index is 27.38 kg/m.  Current Medications (verified) Outpatient Encounter Medications as of 04/11/2024  Medication Sig   Accu-Chek Softclix Lancets lancets USE TO TEST BLOOD SUGAR TWICE DAILY AS DIRECTED   albuterol  (PROVENTIL ) (2.5 MG/3ML) 0.083% nebulizer solution Take 3 mLs (2.5 mg total) by nebulization every 6 (six) hours as needed for wheezing or shortness of breath. (Patient not taking: Reported on 04/08/2024)   albuterol  (VENTOLIN  HFA) 108 (90 Base) MCG/ACT inhaler USE 2 INHALATIONS BY MOUTH  EVERY 4 TO 6 HOURS AS  NEEDED   albuterol  (VENTOLIN  HFA) 108 (90 Base) MCG/ACT inhaler Inhale 1-2 puffs into the lungs every 6 (six) hours as needed for wheezing or shortness of breath.   azelastine  (ASTELIN ) 0.1 % nasal spray Use 1-2 puffs in each nostril once or twice daily   blood glucose meter kit and supplies KIT Dispense based on patient and insurance preference. Use up to four times daily as directed.   BREO ELLIPTA  100-25 MCG/ACT AEPB Inhale 1 puff into the lungs daily.   Calcium Citrate-Vitamin D  (CALCIUM + D PO) Take 600 mg by mouth in the morning and at bedtime.   celecoxib  (CELEBREX ) 200 MG capsule TAKE 1 CAPSULE(200 MG) BY MOUTH DAILY   cetirizine (ZYRTEC) 10 MG tablet Take 10 mg by mouth daily as needed for allergies.   cholecalciferol  (VITAMIN D3) 25 MCG (1000 UNIT) tablet Take 1,000 Units by mouth daily.   clotrimazole -betamethasone  (LOTRISONE ) cream Apply 1 Application topically daily.   cyclobenzaprine  (FLEXERIL ) 10 MG tablet TAKE 1 TABLET BY MOUTH THREE TIMES DAILY AS NEEDED FOR HIP CONCERNS   diclofenac Sodium (VOLTAREN) 1 % GEL Apply topically daily as needed (Pain).   ezetimibe  (ZETIA ) 10 MG tablet Take 1 tablet (10 mg total) by mouth daily.   fenofibrate  160 MG tablet TAKE 1 TABLET(160 MG) BY MOUTH  DAILY   FEROSUL 325 (65 Fe) MG tablet TAKE 1 TABLET(325 MG) BY MOUTH DAILY WITH BREAKFAST   fluconazole  (DIFLUCAN ) 150 MG tablet Take 1 tablet (150 mg total) by mouth daily. May repeat in 3 days if needed. (Patient not taking: Reported on 04/08/2024)   glucose blood test strip Use to check sugars twice daily   Lidocaine  4 % PTCH Apply 1 patch topically as needed (back pain).   metFORMIN  (GLUCOPHAGE -XR) 500 MG 24 hr tablet Take 1 tablet with morning meal,  1 tablet with lunch and 2 tablets with evening meal.   methocarbamol  (ROBAXIN ) 500 MG tablet TAKE 1 TABLET(500 MG) BY MOUTH EVERY 6 HOURS AS NEEDED FOR MUSCLE SPASMS   mirabegron  ER (MYRBETRIQ ) 50 MG TB24 tablet TAKE 1 TABLET(50 MG) BY MOUTH DAILY   Multiple Vitamin (MULTIVITAMIN WITH MINERALS) TABS tablet Take 1 tablet by mouth daily. Centrum One a day - 50+   Multiple Vitamins-Minerals (PRESERVISION AREDS 2+MULTI VIT PO) Take 1 tablet by mouth in the morning and at bedtime.   mupirocin  ointment (BACTROBAN ) 2 % Apply 1 Application topically 3 (three) times daily.   Polyethyl Glycol-Propyl Glycol (SYSTANE OP) Place 1 drop into both eyes daily as needed (dry eyes).   polyethylene glycol (MIRALAX  / GLYCOLAX ) packet Take 17 g by mouth daily as needed for mild constipation.    pregabalin  (LYRICA ) 100 MG capsule TAKE 1 CAPSULE BY MOUTH THREE TIMES DAILY   traMADol  (ULTRAM ) 50 MG tablet Take 1 tablet (50 mg total) by mouth every 6 (six) hours as needed. (Patient not taking: Reported on 04/08/2024)   Trolamine Salicylate (ASPERCREME EX) Apply 1 application topically daily as needed (back pain).   VASCEPA  1 g capsule TAKE 2 CAPSULES(2 GRAMS) BY MOUTH TWICE DAILY   No facility-administered encounter medications on file as of 04/11/2024.   Hearing/Vision screen Hearing Screening - Comments:: Denies hearing difficulties   Vision Screening - Comments:: Wears rx glasses - up to date with routine eye exams with  [facility] Immunizations and  Health Maintenance Health Maintenance  Topic Date Due   OPHTHALMOLOGY EXAM  01/18/2024   COVID-19 Vaccine (6 - Mixed Product risk 2025-26 season) 08/29/2024   HEMOGLOBIN A1C  09/02/2024   FOOT EXAM  12/24/2024   Mammogram  01/01/2025   Diabetic kidney evaluation - eGFR measurement  03/04/2025   Diabetic kidney evaluation - Urine ACR  03/04/2025   Medicare Annual Wellness (AWV)  04/11/2025   DTaP/Tdap/Td (3 - Td or Tdap) 05/01/2031   Pneumococcal Vaccine: 50+ Years  Completed   Influenza Vaccine  Completed   DEXA SCAN  Completed   Zoster Vaccines- Shingrix  Completed   Meningococcal B Vaccine  Aged Out   Hepatitis C Screening  Discontinued        Assessment/Plan:  This is a routine wellness examination for [facility].  Patient Care Team: Antonio Meth, Jamee SAUNDERS, DO as PCP - General (Family Medicine) Neysa Reggy BIRCH, MD as Consulting Physician (Pulmonary Disease) Addie Cordella Hamilton, MD as Consulting Physician (Orthopedic Surgery) Vernetta Lonni GRADE, MD as Consulting Physician (Orthopedic Surgery) Joshua Sieving, MD as Consulting Physician (Dermatology) Carla Milling, RPH-CPP (Pharmacist)  I have personally reviewed and noted the following in the patient's chart:   Medical and social history Use of alcohol, tobacco or illicit drugs  Current medications and supplements including opioid prescriptions. Functional ability and status Nutritional status Physical activity Advanced directives List of other physicians Hospitalizations, surgeries, and ER visits in previous 12 months Vitals Screenings to include cognitive, depression, and falls Referrals and appointments  No orders of the defined types were placed in this encounter.  In addition, I have reviewed and discussed with patient certain preventive protocols, quality metrics, and best practice recommendations. A written personalized care plan for preventive services as well as general preventive health recommendations were  provided to patient.   Rojelio LELON Blush, LPN   [id number]/7974    Return in 1 year on 04/17/25  After Visit Summary: (MyChart) Due to this being a telephonic  visit, the after visit summary with patients personalized plan was offered to patient via MyChart   Nurse Notes: None

## 2024-04-12 ENCOUNTER — Other Ambulatory Visit: Payer: Self-pay | Admitting: Internal Medicine

## 2024-04-12 NOTE — Telephone Encounter (Signed)
 Copied from CRM [id number]. Topic: Clinical - Medication Refill >> Apr 12, 2024  9:06 AM Isabell A wrote: Medication: BREO ELLIPTA  100-25 MCG/ACT AEPB [[id number]]  Has the patient contacted their pharmacy? Yes (Agent: If no, request that the patient contact the pharmacy for the refill. If patient does not wish to contact the pharmacy document the reason why and proceed with request.) (Agent: If yes, when and what did the pharmacy advise?)  This is the patient's preferred pharmacy:  [facility] [place] [id number] [place], [place] - [address] AT [facility] OF [facility] [address] & [facility] [address] [place] [address] Phone: [phone] Fax: [phone]   Is this the correct pharmacy for this prescription? Yes If no, delete pharmacy and type the correct one.   Has the prescription been filled recently? Yes  Is the patient out of the medication? No  Has the patient been seen for an appointment in the last year OR does the patient have an upcoming appointment? Yes  Can we respond through MyChart? No  Agent: Please be advised that Rx refills may take up to 3 business days. We ask that you follow-up with your pharmacy.

## 2024-04-15 MED ORDER — BREO ELLIPTA 100-25 MCG/ACT IN AEPB: 1.0000 | INHALATION_SPRAY | Freq: Every day | RESPIRATORY_TRACT | 3 refills | Status: DC

## 2024-04-16 ENCOUNTER — Other Ambulatory Visit: Payer: Self-pay

## 2024-04-16 MED ORDER — BREO ELLIPTA 100-25 MCG/ACT IN AEPB: 1.0000 | INHALATION_SPRAY | Freq: Every day | RESPIRATORY_TRACT | 3 refills | Status: DC

## 2024-04-19 ENCOUNTER — Other Ambulatory Visit: Payer: Self-pay | Admitting: Family Medicine

## 2024-04-19 DIAGNOSIS — M545 Low back pain, unspecified: Secondary | ICD-10-CM

## 2024-04-22 ENCOUNTER — Other Ambulatory Visit: Payer: Self-pay | Admitting: Family Medicine

## 2024-04-22 MED ORDER — TRAMADOL HCL 50 MG PO TABS: 50.0000 mg | ORAL_TABLET | Freq: Four times a day (QID) | ORAL | 0 refills | Status: DC | PRN

## 2024-04-22 NOTE — Telephone Encounter (Signed)
 Requesting: Tramadol  Contract: 06/16/2022 UDS: 06/16/2022 Last OV: 03/04/2024 Next OV: 09/03/2023 Last Refill: 02/20/2024, #30--0 RF Database:   Please advise

## 2024-04-22 NOTE — Telephone Encounter (Signed)
 Copied from CRM [id number]. Topic: Clinical - Medication Refill >> Apr 22, 2024  1:12 PM Alexandria E wrote: Medication: traMADol  (ULTRAM ) 50 MG tablet   Has the patient contacted their pharmacy? No (Agent: If no, request that the patient contact the pharmacy for the refill. If patient does not wish to contact the pharmacy document the reason why and proceed with request.) (Agent: If yes, when and what did the pharmacy advise?)  This is the patient's preferred pharmacy:  [id number] [place], [place] - [address] AT [facility] OF [facility] [address] & [facility] [address] [place] [address] Phone: [phone] Fax: [phone]  Is this the correct pharmacy for this prescription? Yes If no, delete pharmacy and type the correct one.   Has the prescription been filled recently? No  Is the patient out of the medication? Yes  Has the patient been seen for an appointment in the last year OR does the patient have an upcoming appointment? Yes  Can we respond through MyChart? Yes  Agent: Please be advised that Rx refills may take up to 3 business days. We ask that you follow-up with your pharmacy.

## 2024-05-14 ENCOUNTER — Ambulatory Visit (HOSPITAL_COMMUNITY)

## 2024-05-17 ENCOUNTER — Other Ambulatory Visit: Payer: Self-pay

## 2024-05-17 ENCOUNTER — Other Ambulatory Visit: Payer: Self-pay | Admitting: Family Medicine

## 2024-05-17 DIAGNOSIS — G8929 Other chronic pain: Secondary | ICD-10-CM

## 2024-05-17 DIAGNOSIS — E1169 Type 2 diabetes mellitus with other specified complication: Secondary | ICD-10-CM

## 2024-05-17 MED ORDER — CELECOXIB 200 MG PO CAPS: ORAL_CAPSULE | ORAL | 3 refills | Status: DC

## 2024-05-28 ENCOUNTER — Ambulatory Visit: Admitting: Internal Medicine

## 2024-05-31 ENCOUNTER — Ambulatory Visit (HOSPITAL_COMMUNITY)
Admission: RE | Admit: 2024-05-31 | Discharge: 2024-05-31 | Disposition: A | Discharge status: Home / Self Care | Source: Ambulatory Visit | Attending: Internal Medicine | Admitting: Internal Medicine

## 2024-05-31 ENCOUNTER — Other Ambulatory Visit: Payer: Self-pay | Admitting: Family Medicine

## 2024-05-31 DIAGNOSIS — I358 Other nonrheumatic aortic valve disorders: Secondary | ICD-10-CM | POA: Diagnosis not present

## 2024-05-31 DIAGNOSIS — I350 Nonrheumatic aortic (valve) stenosis: Secondary | ICD-10-CM

## 2024-05-31 LAB — ECHOCARDIOGRAM COMPLETE
AR max vel: 1.08 cm2
AV Area VTI: 0.99 cm2
AV Area mean vel: 1.01 cm2
AV Mean grad: 19 mmHg
AV Peak grad: 34.1 mmHg
Ao pk vel: 2.92 m/s
Area-P 1/2: 2.27 cm2
S' Lateral: 2.9 cm

## 2024-05-31 NOTE — Telephone Encounter (Signed)
 Copied from CRM [id number]. Topic: Clinical - Medication Refill >> May 31, 2024  4:18 PM [name] wrote: Medication:  traMADol  (ULTRAM ) 50 MG tablet Has the patient contacted their pharmacy? Yes (Agent: If no, request that the patient contact the pharmacy for the refill. If patient does not wish to contact the pharmacy document the reason why and proceed with request.) (Agent: If yes, when and what did the pharmacy advise?)  This is the patient's preferred pharmacy:   [facility] [address] [place] [address] Phone: [phone] Fax: [phone]  Is this the correct pharmacy for this prescription? Yes If no, delete pharmacy and type the correct one.   Has the prescription been filled recently? No  Is the patient out of the medication? Yes  Has the patient been seen for an appointment in the last year OR does the patient have an upcoming appointment? Yes  Can we respond through MyChart? No  Agent: Please be advised that Rx refills may take up to 3 business days. We ask that you follow-up with your pharmacy.

## 2024-06-03 ENCOUNTER — Other Ambulatory Visit (HOSPITAL_BASED_OUTPATIENT_CLINIC_OR_DEPARTMENT_OTHER): Payer: Self-pay

## 2024-06-03 MED ORDER — TRAMADOL HCL 50 MG PO TABS
50.0000 mg | ORAL_TABLET | Freq: Four times a day (QID) | ORAL | 0 refills | Status: DC | PRN
  Filled 2024-06-03: qty 30, 7d supply, fill #0

## 2024-06-03 NOTE — Telephone Encounter (Signed)
 Requesting: tramadol  50mg   Contract: UDS: 06/16/22 Last Visit: 03/04/24 Next Visit:09/02/24 Last Refill: 04/22/24 #30 and 0RF  Please Advise

## 2024-06-04 ENCOUNTER — Ambulatory Visit: Payer: Self-pay | Admitting: Internal Medicine

## 2024-06-04 ENCOUNTER — Other Ambulatory Visit (HOSPITAL_BASED_OUTPATIENT_CLINIC_OR_DEPARTMENT_OTHER): Payer: Self-pay

## 2024-06-04 DIAGNOSIS — I350 Nonrheumatic aortic (valve) stenosis: Secondary | ICD-10-CM

## 2024-06-11 ENCOUNTER — Other Ambulatory Visit: Payer: Self-pay

## 2024-06-11 ENCOUNTER — Inpatient Hospital Stay (HOSPITAL_BASED_OUTPATIENT_CLINIC_OR_DEPARTMENT_OTHER)
Admission: EM | Admit: 2024-06-11 | Discharge: 2024-06-16 | DRG: 193 | Disposition: A | Discharge status: Home Health | Attending: Internal Medicine | Admitting: Internal Medicine

## 2024-06-11 ENCOUNTER — Emergency Department (HOSPITAL_BASED_OUTPATIENT_CLINIC_OR_DEPARTMENT_OTHER): Admitting: Radiology

## 2024-06-11 ENCOUNTER — Ambulatory Visit: Payer: Self-pay | Admitting: *Deleted

## 2024-06-11 ENCOUNTER — Emergency Department (HOSPITAL_BASED_OUTPATIENT_CLINIC_OR_DEPARTMENT_OTHER)

## 2024-06-11 ENCOUNTER — Other Ambulatory Visit: Payer: Self-pay | Admitting: Family Medicine

## 2024-06-11 DIAGNOSIS — J189 Pneumonia, unspecified organism: Principal | ICD-10-CM | POA: Diagnosis present

## 2024-06-11 DIAGNOSIS — Z981 Arthrodesis status: Secondary | ICD-10-CM

## 2024-06-11 DIAGNOSIS — M545 Low back pain, unspecified: Secondary | ICD-10-CM

## 2024-06-11 DIAGNOSIS — Z66 Do not resuscitate: Secondary | ICD-10-CM | POA: Diagnosis present

## 2024-06-11 DIAGNOSIS — Z85828 Personal history of other malignant neoplasm of skin: Secondary | ICD-10-CM

## 2024-06-11 DIAGNOSIS — J188 Other pneumonia, unspecified organism: Principal | ICD-10-CM

## 2024-06-11 DIAGNOSIS — Z79899 Other long term (current) drug therapy: Secondary | ICD-10-CM

## 2024-06-11 DIAGNOSIS — Z7984 Long term (current) use of oral hypoglycemic drugs: Secondary | ICD-10-CM

## 2024-06-11 DIAGNOSIS — Z823 Family history of stroke: Secondary | ICD-10-CM

## 2024-06-11 DIAGNOSIS — D509 Iron deficiency anemia, unspecified: Secondary | ICD-10-CM

## 2024-06-11 DIAGNOSIS — I700 Atherosclerosis of aorta: Secondary | ICD-10-CM | POA: Diagnosis present

## 2024-06-11 DIAGNOSIS — K219 Gastro-esophageal reflux disease without esophagitis: Secondary | ICD-10-CM | POA: Diagnosis present

## 2024-06-11 DIAGNOSIS — J4521 Mild intermittent asthma with (acute) exacerbation: Secondary | ICD-10-CM | POA: Diagnosis present

## 2024-06-11 DIAGNOSIS — R54 Age-related physical debility: Secondary | ICD-10-CM | POA: Diagnosis present

## 2024-06-11 DIAGNOSIS — E1169 Type 2 diabetes mellitus with other specified complication: Secondary | ICD-10-CM | POA: Diagnosis present

## 2024-06-11 DIAGNOSIS — G8929 Other chronic pain: Secondary | ICD-10-CM

## 2024-06-11 DIAGNOSIS — F32A Depression, unspecified: Secondary | ICD-10-CM | POA: Diagnosis present

## 2024-06-11 DIAGNOSIS — Z833 Family history of diabetes mellitus: Secondary | ICD-10-CM

## 2024-06-11 DIAGNOSIS — E1122 Type 2 diabetes mellitus with diabetic chronic kidney disease: Secondary | ICD-10-CM | POA: Diagnosis present

## 2024-06-11 DIAGNOSIS — Z7951 Long term (current) use of inhaled steroids: Secondary | ICD-10-CM

## 2024-06-11 DIAGNOSIS — Z96643 Presence of artificial hip joint, bilateral: Secondary | ICD-10-CM | POA: Diagnosis present

## 2024-06-11 DIAGNOSIS — Z1152 Encounter for screening for COVID-19: Secondary | ICD-10-CM

## 2024-06-11 DIAGNOSIS — R918 Other nonspecific abnormal finding of lung field: Secondary | ICD-10-CM | POA: Diagnosis present

## 2024-06-11 DIAGNOSIS — E1165 Type 2 diabetes mellitus with hyperglycemia: Secondary | ICD-10-CM | POA: Diagnosis present

## 2024-06-11 DIAGNOSIS — Z8249 Family history of ischemic heart disease and other diseases of the circulatory system: Secondary | ICD-10-CM

## 2024-06-11 DIAGNOSIS — E7849 Other hyperlipidemia: Secondary | ICD-10-CM | POA: Diagnosis present

## 2024-06-11 DIAGNOSIS — J4520 Mild intermittent asthma, uncomplicated: Secondary | ICD-10-CM | POA: Diagnosis present

## 2024-06-11 DIAGNOSIS — I10 Essential (primary) hypertension: Secondary | ICD-10-CM | POA: Diagnosis present

## 2024-06-11 DIAGNOSIS — Z86711 Personal history of pulmonary embolism: Secondary | ICD-10-CM

## 2024-06-11 DIAGNOSIS — Z86718 Personal history of other venous thrombosis and embolism: Secondary | ICD-10-CM

## 2024-06-11 DIAGNOSIS — N189 Chronic kidney disease, unspecified: Secondary | ICD-10-CM | POA: Diagnosis present

## 2024-06-11 DIAGNOSIS — Z825 Family history of asthma and other chronic lower respiratory diseases: Secondary | ICD-10-CM

## 2024-06-11 DIAGNOSIS — J9601 Acute respiratory failure with hypoxia: Secondary | ICD-10-CM | POA: Diagnosis present

## 2024-06-11 DIAGNOSIS — Z888 Allergy status to other drugs, medicaments and biological substances status: Secondary | ICD-10-CM

## 2024-06-11 DIAGNOSIS — I129 Hypertensive chronic kidney disease with stage 1 through stage 4 chronic kidney disease, or unspecified chronic kidney disease: Secondary | ICD-10-CM | POA: Diagnosis present

## 2024-06-11 DIAGNOSIS — J8410 Pulmonary fibrosis, unspecified: Secondary | ICD-10-CM | POA: Diagnosis present

## 2024-06-11 DIAGNOSIS — N3281 Overactive bladder: Secondary | ICD-10-CM

## 2024-06-11 DIAGNOSIS — E1140 Type 2 diabetes mellitus with diabetic neuropathy, unspecified: Secondary | ICD-10-CM | POA: Diagnosis present

## 2024-06-11 DIAGNOSIS — F419 Anxiety disorder, unspecified: Secondary | ICD-10-CM | POA: Diagnosis present

## 2024-06-11 LAB — CBC WITH DIFFERENTIAL/PLATELET
Abs Immature Granulocytes: 0.04 K/uL (ref 0.00–0.07)
Basophils Absolute: 0 K/uL (ref 0.0–0.1)
Basophils Relative: 0 %
Eosinophils Absolute: 0 K/uL (ref 0.0–0.5)
Eosinophils Relative: 0 %
HCT: 34.9 % — ABNORMAL LOW (ref 36.0–46.0)
Hemoglobin: 11.5 g/dL — ABNORMAL LOW (ref 12.0–15.0)
Immature Granulocytes: 0 %
Lymphocytes Relative: 7 %
Lymphs Abs: 0.7 K/uL (ref 0.7–4.0)
MCH: 28.8 pg (ref 26.0–34.0)
MCHC: 33 g/dL (ref 30.0–36.0)
MCV: 87.3 fL (ref 80.0–100.0)
Monocytes Absolute: 0.7 K/uL (ref 0.1–1.0)
Monocytes Relative: 7 %
Neutro Abs: 8.9 K/uL — ABNORMAL HIGH (ref 1.7–7.7)
Neutrophils Relative %: 86 %
Platelets: 189 K/uL (ref 150–400)
RBC: 4 MIL/uL (ref 3.87–5.11)
RDW: 14.8 % (ref 11.5–15.5)
WBC: 10.3 K/uL (ref 4.0–10.5)
nRBC: 0 % (ref 0.0–0.2)

## 2024-06-11 LAB — COMPREHENSIVE METABOLIC PANEL WITH GFR
ALT: 49 U/L — ABNORMAL HIGH (ref 0–44)
AST: 60 U/L — ABNORMAL HIGH (ref 15–41)
Albumin: 3.8 g/dL (ref 3.5–5.0)
Alkaline Phosphatase: 70 U/L (ref 38–126)
Anion gap: 12 (ref 5–15)
BUN: 14 mg/dL (ref 8–23)
CO2: 23 mmol/L (ref 22–32)
Calcium: 9.9 mg/dL (ref 8.9–10.3)
Chloride: 101 mmol/L (ref 98–111)
Creatinine, Ser: 1.13 mg/dL — ABNORMAL HIGH (ref 0.44–1.00)
GFR, Estimated: 48 mL/min — ABNORMAL LOW
Glucose, Bld: 228 mg/dL — ABNORMAL HIGH (ref 70–99)
Potassium: 4.3 mmol/L (ref 3.5–5.1)
Sodium: 135 mmol/L (ref 135–145)
Total Bilirubin: 0.4 mg/dL (ref 0.0–1.2)
Total Protein: 6.7 g/dL (ref 6.5–8.1)

## 2024-06-11 LAB — RESP PANEL BY RT-PCR (RSV, FLU A&B, COVID)  RVPGX2
Influenza A by PCR: NEGATIVE
Influenza B by PCR: NEGATIVE
Resp Syncytial Virus by PCR: NEGATIVE
SARS Coronavirus 2 by RT PCR: NEGATIVE

## 2024-06-11 LAB — TROPONIN T, HIGH SENSITIVITY
Troponin T High Sensitivity: 17 ng/L (ref 0–19)
Troponin T High Sensitivity: <15 ng/L (ref 0–19)

## 2024-06-11 LAB — LACTIC ACID, PLASMA: Lactic Acid, Venous: 1.3 mmol/L (ref 0.5–1.9)

## 2024-06-11 MED ORDER — FLUTICASONE FUROATE-VILANTEROL 200-25 MCG/ACT IN AEPB: 1.0000 | INHALATION_SPRAY | Freq: Every day | RESPIRATORY_TRACT | 11 refills | Status: DC

## 2024-06-11 MED ORDER — IOHEXOL 350 MG/ML SOLN
100.0000 mL | Freq: Once | INTRAVENOUS | Status: AC | PRN
  Administered 2024-06-11: 75 mL via INTRAVENOUS

## 2024-06-11 MED ORDER — METHYLPREDNISOLONE SODIUM SUCC 125 MG IJ SOLR
125.0000 mg | Freq: Once | INTRAMUSCULAR | Status: AC
  Administered 2024-06-11: 125 mg via INTRAVENOUS
  Filled 2024-06-11: qty 2

## 2024-06-11 MED ORDER — MAGNESIUM SULFATE 2 GM/50ML IV SOLN
2.0000 g | Freq: Once | INTRAVENOUS | Status: AC
  Administered 2024-06-11: 2 g via INTRAVENOUS
  Filled 2024-06-11: qty 50

## 2024-06-11 MED ORDER — SODIUM CHLORIDE 0.9 % IV SOLN
1.0000 g | Freq: Once | INTRAVENOUS | Status: AC
  Administered 2024-06-11: 1 g via INTRAVENOUS
  Filled 2024-06-11: qty 10

## 2024-06-11 MED ORDER — IPRATROPIUM-ALBUTEROL 0.5-2.5 (3) MG/3ML IN SOLN
3.0000 mL | RESPIRATORY_TRACT | Status: AC
  Administered 2024-06-11: 3 mL via RESPIRATORY_TRACT
  Filled 2024-06-11: qty 3

## 2024-06-11 MED ORDER — AZITHROMYCIN 250 MG PO TABS
500.0000 mg | ORAL_TABLET | Freq: Every day | ORAL | Status: DC
  Administered 2024-06-11 – 2024-06-12 (×2): 500 mg via ORAL
  Filled 2024-06-11 (×2): qty 2

## 2024-06-11 MED ORDER — IOHEXOL 300 MG/ML  SOLN: 100.0000 mL | Freq: Once | INTRAMUSCULAR | Status: DC | PRN

## 2024-06-11 NOTE — Progress Notes (Deleted)
 "  Subjective:    Patient ID: [name], female    DOB: [birth year], 84 y.o.   MRN: [id number]  No chief complaint on file.   HPI Patient is in today for ***  Past Medical History:  Diagnosis Date   Anemia    Arthritis    Asthma    adult onset   Basal cell cancer    LUE; Porokeratosis also   Chronic kidney disease 1963   strep in kidney due to strep throat-hospitalized 10 days   Complication of anesthesia    small trachea   Diabetes mellitus 2010   A1c 6.7%   DVT (deep venous thrombosis) (HCC) 2006   post immobilization post cns surgery   GERD (gastroesophageal reflux disease)    very mild   Granulomatous lung disease (HCC) 2002   incidental Xray finding   Hyperlipidemia    Hypertension 2004   Hypertensive response on Stress Test   Paralysis (HCC) 2006   post cervical fusion with spinal sac tear  with hematoma    PTE (pulmonary thromboembolism) (HCC) 2006    Past Surgical History:  Procedure Laterality Date   ABDOMINAL EXPOSURE N/A 08/12/2016   Procedure: ABDOMINAL EXPOSURE;  Surgeon: Krystal JULIANNA Doing, MD;  Location: [facility] OR;  Service: Vascular;  Laterality: N/A;   ANTERIOR LAT LUMBAR FUSION N/A 08/12/2016   Procedure: LUMBAR TWO-THREE, LUMBAR THREE-FOUR, LUMBAR FOUR-FIVE  ANTEROLATERAL LUMBAR INTERBODY FUSION;  Surgeon: Fairy Levels, MD;  Location: [facility] OR;  Service: Neurosurgery;  Laterality: N/A;  L2-3 L3-4 L4-5 Anterolateral lumbar interbody fusion   ANTERIOR LUMBAR FUSION N/A 08/12/2016   Procedure: Lumbar Five-Sacral One Anterior lumbar interbody fusion with Dr. Krystal Early to assist;  Surgeon: Fairy Levels, MD;  Location: [facility] OR;  Service: Neurosurgery;  Laterality: N/A;  L5-S1 Anterior lumbar interbody fusion with Dr. Krystal Early to assist   BUNIONECTOMY     CATARACT EXTRACTION Right 12/31/2018   CATARACT EXTRACTION Left 12/17/2018   CERVICAL FUSION  2006   Dr Louis, NS;post op hematoma & cns leak & urinary retention   COLONOSCOPY  1992 & 2002   negative   epidural  steroids  2006   cervical spine   LUMBAR PERCUTANEOUS PEDICLE SCREW 4 LEVEL N/A 08/12/2016   Procedure: LUMBAR TWO-SACRAL ONE Percuataneous Pedicle Screws;  Surgeon: Fairy Levels, MD;  Location: [facility] OR;  Service: Neurosurgery;  Laterality: N/A;   ROTATOR CUFF REPAIR  2009   Right   SEPTOPLASTY     TONSILLECTOMY  84 years old   TOTAL HIP ARTHROPLASTY  06/01/2012   Procedure: TOTAL HIP ARTHROPLASTY ANTERIOR APPROACH;  Surgeon: Lonni CINDERELLA Poli, MD;  Location: [facility] ORS;  Service: Orthopedics;  Laterality: Left;  Left Total Hip Arthroplasty   TOTAL HIP ARTHROPLASTY Right 02/23/2018   Procedure: RIGHT TOTAL HIP ARTHROPLASTY ANTERIOR APPROACH;  Surgeon: Poli Lonni CINDERELLA, MD;  Location: [facility] ORS;  Service: Orthopedics;  Laterality: Right;   TUBAL LIGATION      Family History  Problem Relation Age of Onset   COPD Father        emphysema   Cancer Mother        cns cancer   Diabetes Sister        TWIN sister ; also Fibromyalgia ; S/P stent 2004   Heart disease Sister        stents @ 69 & 58   Stroke Maternal Grandmother        in  late 45s   Transient ischemic attack Paternal  Aunt     Social History   Socioeconomic History   Marital status: Divorced    Spouse name: Not on file   Number of children: Not on file   Years of education: Not on file   Highest education level: Not on file  Occupational History   Not on file  Tobacco Use   Smoking status: Never    Passive exposure: Past   Smokeless tobacco: Never  Vaping Use   Vaping status: Never Used  Substance and Sexual Activity   Alcohol use: Yes    Alcohol/week: 1.0 standard drink of alcohol    Types: 1 Glasses of wine per week    Comment: socially   Drug use: No   Sexual activity: Never  Other Topics Concern   Not on file  Social History Narrative   Not on file   Social Drivers of Health   Tobacco Use: Low Risk (05/08/2024)   Received from [facility]   Patient History    Smoking Tobacco Use: Never     Smokeless Tobacco Use: Never    Passive Exposure: Not on file  Financial Resource Strain: Low Risk (10/29/2021)   Overall Financial Resource Strain (CARDIA)    Difficulty of Paying Living Expenses: Not hard at all  Food Insecurity: No Food Insecurity (04/11/2024)   Epic    Worried About Programme Researcher, Broadcasting/film/video in the Last Year: Never true    Ran Out of Food in the Last Year: Never true  Transportation Needs: No Transportation Needs (04/11/2024)   Epic    Lack of Transportation (Medical): No    Lack of Transportation (Non-Medical): No  Physical Activity: Sufficiently Active (04/11/2024)   Exercise Vital Sign    Days of Exercise per Week: 3 days    Minutes of Exercise per Session: 60 min  Stress: No Stress Concern Present (04/11/2024)   [employer] of Occupational Health - Occupational Stress Questionnaire    Feeling of Stress: Not at all  Social Connections: Moderately Integrated (04/11/2024)   Social Connection and Isolation Panel    Frequency of Communication with Friends and Family: More than three times a week    Frequency of Social Gatherings with Friends and Family: More than three times a week    Attends Religious Services: More than 4 times per year    Active Member of Clubs or Organizations: Yes    Attends Banker Meetings: More than 4 times per year    Marital Status: Divorced  Intimate Partner Violence: Not At Risk (04/11/2024)   Epic    Fear of Current or Ex-Partner: No    Emotionally Abused: No    Physically Abused: No    Sexually Abused: No  Depression (PHQ2-9): Low Risk (04/11/2024)   Depression (PHQ2-9)    PHQ-2 Score: 0  Alcohol Screen: Low Risk (07/14/2022)   Alcohol Screen    Last Alcohol Screening Score (AUDIT): 3  Housing: Unknown (04/11/2024)   Epic    Unable to Pay for Housing in the Last Year: No    Number of Times Moved in the Last Year: Not on file    Homeless in the Last Year: No  Utilities: Not At Risk (04/11/2024)   Epic     Threatened with loss of utilities: No  Health Literacy: Adequate Health Literacy (04/11/2024)   B1300 Health Literacy    Frequency of need for help with medical instructions: Never    Outpatient Medications Prior to Visit  Medication Sig Dispense Refill  Accu-Chek Softclix Lancets lancets USE TO TEST BLOOD SUGAR TWICE DAILY AS DIRECTED 100 each 12   albuterol  (PROVENTIL ) (2.5 MG/3ML) 0.083% nebulizer solution Take 3 mLs (2.5 mg total) by nebulization every 6 (six) hours as needed for wheezing or shortness of breath. (Patient not taking: Reported on 04/08/2024) 75 mL 12   albuterol  (VENTOLIN  HFA) 108 (90 Base) MCG/ACT inhaler USE 2 INHALATIONS BY MOUTH  EVERY 4 TO 6 HOURS AS  NEEDED 51 g 3   albuterol  (VENTOLIN  HFA) 108 (90 Base) MCG/ACT inhaler Inhale 1-2 puffs into the lungs every 6 (six) hours as needed for wheezing or shortness of breath. 1 each 0   azelastine  (ASTELIN ) 0.1 % nasal spray Use 1-2 puffs in each nostril once or twice daily 30 mL 12   blood glucose meter kit and supplies KIT Dispense based on patient and insurance preference. Use up to four times daily as directed. 1 each 0   BREO ELLIPTA  100-25 MCG/ACT AEPB Inhale 1 puff into the lungs daily. 60 each 3   Calcium Citrate-Vitamin D  (CALCIUM + D PO) Take 600 mg by mouth in the morning and at bedtime.     celecoxib  (CELEBREX ) 200 MG capsule TAKE 1 CAPSULE(200 MG) BY MOUTH DAILY 90 capsule 3   cetirizine (ZYRTEC) 10 MG tablet Take 10 mg by mouth daily as needed for allergies.     cholecalciferol  (VITAMIN D3) 25 MCG (1000 UNIT) tablet Take 1,000 Units by mouth daily.     clotrimazole -betamethasone  (LOTRISONE ) cream Apply 1 Application topically daily. 30 g 0   cyclobenzaprine  (FLEXERIL ) 10 MG tablet TAKE 1 TABLET BY MOUTH THREE TIMES DAILY AS NEEDED FOR HIP CONCERNS 30 tablet 2   diclofenac Sodium (VOLTAREN) 1 % GEL Apply topically daily as needed (Pain).     ezetimibe  (ZETIA ) 10 MG tablet Take 1 tablet (10 mg total) by mouth daily.  90 tablet 1   fenofibrate  160 MG tablet TAKE 1 TABLET(160 MG) BY MOUTH DAILY 90 tablet 2   FEROSUL 325 (65 Fe) MG tablet TAKE 1 TABLET(325 MG) BY MOUTH DAILY WITH BREAKFAST 90 tablet 1   fluconazole  (DIFLUCAN ) 150 MG tablet Take 1 tablet (150 mg total) by mouth daily. May repeat in 3 days if needed. (Patient not taking: Reported on 04/08/2024) 2 tablet 0   glucose blood test strip Use to check sugars twice daily 100 each 12   Lidocaine  4 % PTCH Apply 1 patch topically as needed (back pain).     metFORMIN  (GLUCOPHAGE -XR) 500 MG 24 hr tablet Take 1 tablet with morning meal, 1 tablet with lunch and 2 tablets with evening meal. 360 tablet 3   methocarbamol  (ROBAXIN ) 500 MG tablet TAKE 1 TABLET(500 MG) BY MOUTH EVERY 6 HOURS AS NEEDED FOR MUSCLE SPASMS 45 tablet 1   mirabegron  ER (MYRBETRIQ ) 50 MG TB24 tablet TAKE 1 TABLET(50 MG) BY MOUTH DAILY 90 tablet 3   Multiple Vitamin (MULTIVITAMIN WITH MINERALS) TABS tablet Take 1 tablet by mouth daily. Centrum One a day - 50+     Multiple Vitamins-Minerals (PRESERVISION AREDS 2+MULTI VIT PO) Take 1 tablet by mouth in the morning and at bedtime.     mupirocin  ointment (BACTROBAN ) 2 % Apply 1 Application topically 3 (three) times daily. 30 g 3   Polyethyl Glycol-Propyl Glycol (SYSTANE OP) Place 1 drop into both eyes daily as needed (dry eyes).     polyethylene glycol (MIRALAX  / GLYCOLAX ) packet Take 17 g by mouth daily as needed for mild constipation.  pregabalin  (LYRICA ) 100 MG capsule TAKE 1 CAPSULE BY MOUTH THREE TIMES DAILY 270 capsule 1   traMADol  (ULTRAM ) 50 MG tablet Take 1 tablet (50 mg total) by mouth every 6 (six) hours as needed. 30 tablet 0   Trolamine Salicylate (ASPERCREME EX) Apply 1 application topically daily as needed (back pain).     VASCEPA  1 g capsule TAKE 2 CAPSULES(2 GRAMS) BY MOUTH TWICE DAILY 360 capsule 3   No facility-administered medications prior to visit.    Allergies[1]  ROS     Objective:    Physical Exam  There  were no vitals taken for this visit. Wt Readings from Last 3 Encounters:  04/11/24 157 lb (71.2 kg)  04/08/24 159 lb 12.8 oz (72.5 kg)  03/04/24 156 lb 9.6 oz (71 kg)    Diabetic Foot Exam - Simple   No data filed    Lab Results  Component Value Date   WBC 5.4 03/04/2024   HGB 11.4 (L) 03/04/2024   HCT 33.6 (L) 03/04/2024   PLT 219.0 03/04/2024   GLUCOSE 183 (H) 03/04/2024   CHOL 149 03/04/2024   TRIG 105.0 03/04/2024   HDL 55.00 03/04/2024   LDLDIRECT 54 07/15/2021   LDLCALC 73 03/04/2024   ALT 20 03/04/2024   AST 19 03/04/2024   NA 140 03/04/2024   K 4.3 03/04/2024   CL 105 03/04/2024   CREATININE 0.96 03/04/2024   BUN 19 03/04/2024   CO2 25 03/04/2024   TSH 5.12 03/04/2024   INR 1.1 01/15/2022   HGBA1C 8.3 (H) 03/04/2024   MICROALBUR 1.9 03/04/2024    Lab Results  Component Value Date   TSH 5.12 03/04/2024   Lab Results  Component Value Date   WBC 5.4 03/04/2024   HGB 11.4 (L) 03/04/2024   HCT 33.6 (L) 03/04/2024   MCV 90.5 03/04/2024   PLT 219.0 03/04/2024   Lab Results  Component Value Date   NA 140 03/04/2024   K 4.3 03/04/2024   CO2 25 03/04/2024   GLUCOSE 183 (H) 03/04/2024   BUN 19 03/04/2024   CREATININE 0.96 03/04/2024   BILITOT 0.4 03/04/2024   ALKPHOS 35 (L) 03/04/2024   AST 19 03/04/2024   ALT 20 03/04/2024   PROT 6.1 03/04/2024   ALBUMIN 4.1 03/04/2024   CALCIUM 9.5 03/04/2024   ANIONGAP 7 08/25/2023   GFR 54.87 (L) 03/04/2024   Lab Results  Component Value Date   CHOL 149 03/04/2024   Lab Results  Component Value Date   HDL 55.00 03/04/2024   Lab Results  Component Value Date   LDLCALC 73 03/04/2024   Lab Results  Component Value Date   TRIG 105.0 03/04/2024   Lab Results  Component Value Date   CHOLHDL 3 03/04/2024   Lab Results  Component Value Date   HGBA1C 8.3 (H) 03/04/2024       Assessment & Plan:  There are no diagnoses linked to this encounter.  Jesslynn Kruck R Lowne Chase, DO    [1]   Allergies Allergen Reactions   Statins Other (See Comments)    Myalgias and muscle weakness   Jardiance  [Empagliflozin ] Rash   "

## 2024-06-11 NOTE — ED Notes (Signed)
 Pt assisted to bathroom in wheelchair. No O2 applied during bathroom trip. Upon getting back into bed, pt SpO2 88%. [place] reapplied at 2L

## 2024-06-11 NOTE — Telephone Encounter (Signed)
 Recommended ED. Patient requesting if PCP will consider increasing dose of inhaler or changing medications as discussed in last OV. Please advise.   FYI Only or Action Required?: FYI only for provider: ED advised.  Patient was last seen in primary care on 03/04/2024 by Antonio Meth, Jamee SAUNDERS, DO.  Called Nurse Triage reporting Shortness of Breath.  Symptoms began several days ago.  Interventions attempted: Prescription medications: breo, inhaler.  Symptoms are: gradually worsening.  Triage Disposition: Go to ED Now (or PCP Triage)  Patient/caregiver understands and will follow disposition?: Yes                Copied from CRM #[id number]. Topic: Clinical - Red Word Triage >> Jun 11, 2024 10:16 AM Roselie BROCKS wrote: Red Word that prompted transfer to Nurse Triage: Patient states she is coughing really deeply, and wheezing ,shortness of breathe Reason for Disposition  Patient sounds very sick or weak to the triager  Answer Assessment - Initial Assessment Questions Recommended ED. Patient reports she will go . Patient requesting if PCP will increase dose of medications breo and or inhaler, or change medication as reviewed on last OV. Please advise regarding medication. Patient requesting medications to be sent to new pharmacy, [facility].        1. RESPIRATORY STATUS: Describe your breathing? (e.g., wheezing, shortness of breath, unable to speak, severe coughing)      Shortness of breath at rest, wheezing, coughing deep 2. ONSET: When did this breathing problem begin?      Several days since returning from twin sisters funeral 3. PATTERN Does the difficult breathing come and go, or has it been constant since it started?      constant 4. SEVERITY: How bad is your breathing? (e.g., mild, moderate, severe)      Can speak without SOB but SOB at rest 5. RECURRENT SYMPTOM: Have you had difficulty breathing before? If Yes, ask: When was the last  time? and What happened that time?      Yes  6. CARDIAC HISTORY: Do you have any history of heart disease? (e.g., heart attack, angina, bypass surgery, angioplasty)      Na  7. LUNG HISTORY: Do you have any history of lung disease?  (e.g., pulmonary embolus, asthma, emphysema)     Yes  8. CAUSE: What do you think is causing the breathing problem?      Virus , cough nose stopped up, SOB , wheezing at night  9. OTHER SYMPTOMS: Do you have any other symptoms? (e.g., chest pain, cough, dizziness, fever, runny nose)     No chest pain , SOB at rest, lightheaded, cough, wheezing at night using more inhaler than normal 10. O2 SATURATION MONITOR:  Do you use an oxygen  saturation monitor (pulse oximeter) at home? If Yes, ask: What is your reading (oxygen  level) today? What is your usual oxygen  saturation reading? (e.g., 95%)       na 11. PREGNANCY: Is there any chance you are pregnant? When was your last menstrual period?       na 12. TRAVEL: Have you traveled out of the country in the last month? (e.g., travel history, exposures)       na  Protocols used: Breathing Difficulty-A-AH

## 2024-06-11 NOTE — Telephone Encounter (Signed)
 Left message on machine to call back  Ok for E2C2 to relay message.

## 2024-06-11 NOTE — Telephone Encounter (Signed)
 Pt went to ER

## 2024-06-11 NOTE — ED Provider Notes (Signed)
 " [place] EMERGENCY DEPARTMENT AT [facility] Provider Note   CSN: [id number] Arrival date & time: 06/11/24  1102     Patient presents with: No chief complaint on file.   [name] is a 84 y.o. female with h/o asthma, DM presents to the ER today for evaluation of cough, nasal congestion, and wheezing for the past 2 days. She reports that she was at a funeral on Sunday and was around people who were coughing and started to feel sick that night. She tried her albuterol  inhaler and felt some relief last night. No measure fevers. Denies chest pain, body aches, weight loss, nausea, abdominal pain.  Denies any leg swelling. Reports compliance to her medication. Does have a history of a PE years ago post surgery. Not on anti coagulation.  HPI     Prior to Admission medications  Medication Sig Start Date End Date Taking? Authorizing Provider  Accu-Chek Softclix Lancets lancets USE TO TEST BLOOD SUGAR TWICE DAILY AS DIRECTED 06/11/21   Antonio Meth, Jamee SAUNDERS, DO  albuterol  (PROVENTIL ) (2.5 MG/3ML) 0.083% nebulizer solution Take 3 mLs (2.5 mg total) by nebulization every 6 (six) hours as needed for wheezing or shortness of breath. Patient not taking: Reported on 04/08/2024 11/23/21   Neysa Rama D, MD  albuterol  (VENTOLIN  HFA) 108 (90 Base) MCG/ACT inhaler USE 2 INHALATIONS BY MOUTH  EVERY 4 TO 6 HOURS AS  NEEDED 05/29/23   Neysa Rama BIRCH, MD  albuterol  (VENTOLIN  HFA) 108 [phone] Base) MCG/ACT inhaler Inhale 1-2 puffs into the lungs every 6 (six) hours as needed for wheezing or shortness of breath. 08/25/23   Ula Prentice SAUNDERS, MD  azelastine  (ASTELIN ) 0.1 % nasal spray Use 1-2 puffs in each nostril once or twice daily 11/23/21   Neysa Rama BIRCH, MD  blood glucose meter kit and supplies KIT Dispense based on patient and insurance preference. Use up to four times daily as directed. 08/11/20   Antonio Meth Jamee SAUNDERS, DO  BREO ELLIPTA  100-25 MCG/ACT AEPB Inhale 1 puff into the lungs daily. 04/16/24    Neysa Rama D, MD  Calcium Citrate-Vitamin D  (CALCIUM + D PO) Take 600 mg by mouth in the morning and at bedtime.    [provider]  celecoxib  (CELEBREX ) 200 MG capsule TAKE 1 CAPSULE(200 MG) BY MOUTH DAILY 05/17/24   Antonio Meth, Yvonne R, DO  cetirizine (ZYRTEC) 10 MG tablet Take 10 mg by mouth daily as needed for allergies.    [provider]  cholecalciferol  (VITAMIN D3) 25 MCG (1000 UNIT) tablet Take 1,000 Units by mouth daily.    [provider]  clotrimazole -betamethasone  (LOTRISONE ) cream Apply 1 Application topically daily. 10/30/23   Antonio Meth Jamee R, DO  cyclobenzaprine  (FLEXERIL ) 10 MG tablet TAKE 1 TABLET BY MOUTH THREE TIMES DAILY AS NEEDED FOR HIP CONCERNS 11/21/23   Antonio Meth, Yvonne R, DO  diclofenac Sodium (VOLTAREN) 1 % GEL Apply topically daily as needed (Pain).    [provider]  ezetimibe  (ZETIA ) 10 MG tablet Take 1 tablet (10 mg total) by mouth daily. 05/17/24   Antonio Meth Jamee R, DO  fenofibrate  160 MG tablet TAKE 1 TABLET(160 MG) BY MOUTH DAILY 01/01/24   Hilty, Vinie BROCKS, MD  FEROSUL 325 (65 Fe) MG tablet TAKE 1 TABLET(325 MG) BY MOUTH DAILY WITH BREAKFAST 11/21/23   Antonio Meth, Yvonne R, DO  fluconazole  (DIFLUCAN ) 150 MG tablet Take 1 tablet (150 mg total) by mouth daily. May repeat in 3 days if  needed. Patient not taking: Reported on 04/08/2024 10/30/23   Antonio Meth, Jamee SAUNDERS, DO  fluticasone  furoate-vilanterol (BREO ELLIPTA ) 200-25 MCG/ACT AEPB Inhale 1 puff into the lungs daily. 06/11/24   Antonio Meth Jamee R, DO  glucose blood test strip Use to check sugars twice daily 08/10/23   Antonio Meth, Jamee R, DO  Lidocaine  4 % PTCH Apply 1 patch topically as needed (back pain).    [provider]  metFORMIN  (GLUCOPHAGE -XR) 500 MG 24 hr tablet Take 1 tablet with morning meal, 1 tablet with lunch and 2 tablets with evening meal. 05/25/23   Antonio Meth, Yvonne R, DO  methocarbamol  (ROBAXIN ) 500 MG tablet TAKE 1 TABLET(500  MG) BY MOUTH EVERY 6 HOURS AS NEEDED FOR MUSCLE SPASMS 04/19/24   Antonio Meth, Yvonne R, DO  mirabegron  ER (MYRBETRIQ ) 50 MG TB24 tablet TAKE 1 TABLET(50 MG) BY MOUTH DAILY 01/03/23   Lowne Chase, Yvonne R, DO  Multiple Vitamin (MULTIVITAMIN WITH MINERALS) TABS tablet Take 1 tablet by mouth daily. Centrum One a day - 50+    [provider]  Multiple Vitamins-Minerals (PRESERVISION AREDS 2+MULTI VIT PO) Take 1 tablet by mouth in the morning and at bedtime.    [provider]  mupirocin  ointment (BACTROBAN ) 2 % Apply 1 Application topically 3 (three) times daily. 10/11/23   Cyndi Shaver, PA-C  Polyethyl Glycol-Propyl Glycol (SYSTANE OP) Place 1 drop into both eyes daily as needed (dry eyes).    [provider]  polyethylene glycol (MIRALAX  / GLYCOLAX ) packet Take 17 g by mouth daily as needed for mild constipation.     [provider]  pregabalin  (LYRICA ) 100 MG capsule TAKE 1 CAPSULE BY MOUTH THREE TIMES DAILY 02/19/24   Antonio Meth, Yvonne R, DO  traMADol  (ULTRAM ) 50 MG tablet Take 1 tablet (50 mg total) by mouth every 6 (six) hours as needed. 06/03/24   Lowne Chase, Yvonne R, DO  Trolamine Salicylate (ASPERCREME EX) Apply 1 application topically daily as needed (back pain).    [provider]  VASCEPA  1 g capsule TAKE 2 CAPSULES(2 GRAMS) BY MOUTH TWICE DAILY 07/24/23   Hilty, Vinie BROCKS, MD    Allergies: Statins and Jardiance  [empagliflozin ]    Review of Systems  Constitutional:  Negative for chills and fever.  HENT:  Positive for congestion and rhinorrhea.   Respiratory:  Positive for cough and shortness of breath.   Cardiovascular:  Negative for chest pain and leg swelling.  Gastrointestinal:  Negative for abdominal pain, nausea and vomiting.    Updated Vital Signs BP (!) 149/116   Pulse 80   Temp 99.7 F (37.6 C) (Oral)   Resp (!) 24   SpO2 91%   Physical Exam Vitals and nursing note reviewed.  Constitutional:      General: She is not in  acute distress.    Appearance: She is not toxic-appearing.  Eyes:     General: No scleral icterus. Cardiovascular:     Rate and Rhythm: Normal rate.  Pulmonary:     Effort: Pulmonary effort is normal.     Breath sounds: Wheezing and rhonchi present.     Comments: Patient has some prolonged expiratory phase with wheezing question some coarse rhonchi auscultated left greater than right at the lower base.  Speaks in full sentences however does appear slightly winded with doing so.  No respiratory distress. Abdominal:     Palpations: Abdomen is soft.     Tenderness: There is no abdominal tenderness.  Skin:  General: Skin is warm and dry.  Neurological:     Mental Status: She is alert.     (all labs ordered are listed, but only abnormal results are displayed) Labs Reviewed  CBC WITH DIFFERENTIAL/PLATELET - Abnormal; Notable for the following components:      Result Value   Hemoglobin 11.5 (*)    HCT 34.9 (*)    Neutro Abs 8.9 (*)    All other components within normal limits  COMPREHENSIVE METABOLIC PANEL WITH GFR - Abnormal; Notable for the following components:   Glucose, Bld 228 (*)    Creatinine, Ser 1.13 (*)    AST 60 (*)    ALT 49 (*)    GFR, Estimated 48 (*)    All other components within normal limits  RESP PANEL BY RT-PCR (RSV, FLU A&B, COVID)  RVPGX2  LACTIC ACID, PLASMA  TROPONIN T, HIGH SENSITIVITY  TROPONIN T, HIGH SENSITIVITY    EKG: None  Radiology: CT Angio Chest PE W and/or Wo Contrast Result Date: 06/11/2024 CLINICAL DATA:  Cough, congestion wheezing EXAM: CT ANGIOGRAPHY CHEST WITH CONTRAST TECHNIQUE: Multidetector CT imaging of the chest was performed using the standard protocol during bolus administration of intravenous contrast. Multiplanar CT image reconstructions and MIPs were obtained to evaluate the vascular anatomy. RADIATION DOSE REDUCTION: This exam was performed according to the departmental dose-optimization program which includes automated  exposure control, adjustment of the mA and/or kV according to patient size and/or use of iterative reconstruction technique. CONTRAST:  75mL OMNIPAQUE  IOHEXOL  350 MG/ML SOLN COMPARISON:  05/11/2021 CTA chest 06/11/2024 chest x-ray FINDINGS: Cardiovascular: Intact thoracic aorta without aneurysm or dissection. No acute aortic process, mediastinal hemorrhage or hematoma. Patent 3 vessel arch anatomy. Aortic atherosclerosis present. Central pulmonary arteries are normal in caliber and patent. No significant filling defect or pulmonary embolus by CTA. Normal heart size. No pericardial effusion. Central venous structures are patent. No veno-occlusive process. Mediastinum/Nodes: Thyroid  unremarkable. Trachea and central airways are patent. Esophagus nondilated. Small hiatal hernia noted. Mildly prominent scattered mediastinal, hilar and subcarinal lymph nodes, short axis measurements all 9 mm or less favored to be reactive. No abnormal bulky adenopathy. Lungs/Pleura: Densely consolidative left upper lobe airspace opacity posteriorly abutting the fissure with central air bronchograms, consistent with consolidative pneumonia. This correlates with the chest x-ray finding. Additional areas of bronchovascular patchy airspace opacity in the superior segment of the left lower lobe also compatible with pneumonia. Minor dependent basilar atelectasis in both lower lobes. No pleural abnormality, effusion, or pneumothorax. Upper Abdomen: Tiny layering gallstones noted, some are calcified. No large focal hepatic abnormality. Abdominal aorta atherosclerotic. No acute upper abdominal finding. Musculoskeletal: Degenerative changes noted throughout the spine and shoulders. Review of the MIP images confirms the above findings. IMPRESSION: 1. Negative for significant acute pulmonary embolus by CTA. 2. Densely consolidative left upper lobe pneumonia and additional patchy pneumonia in the superior segment of the left lower lobe. 3. Recommend  follow-up after medical therapy to document resolution. 4. Aortic atherosclerosis. Aortic Atherosclerosis (ICD10-I70.0). Electronically Signed   By: CHRISTELLA.  Shick M.D.   On: 06/11/2024 15:41   DG Chest 2 View Result Date: 06/11/2024 EXAM: 2 VIEW(S) XRAY OF THE CHEST 06/11/2024 11:50:00 AM COMPARISON: 11/27/2023 CLINICAL HISTORY: The patient presents with shortness of breath. FINDINGS: LUNGS AND PLEURA: New left upper lobe mass-like opacity with possible internal cavitation measuring approximately 7.1 x 4.4 cm. No pleural effusion. No pneumothorax. HEART AND MEDIASTINUM: No acute abnormality of the cardiac and mediastinal silhouettes. BONES AND SOFT TISSUES:  Surgical hardware in lower cervical and lumbar spine partially included. No acute osseous abnormality. IMPRESSION: 1. New left upper lobe mass-like opacity with possible internal cavitation measuring approximately 7.1 x 4.4 cm, which may represent primary lung malignancy or cavitary infection (including cavitary pneumonia or tuberculosis), with inflammatory etiologies less likely. 2. Recommend contrast-enhanced CT chest for further characterization and to guide management; if infection is suspected and treated, repeat chest radiograph in 6-8 weeks to document resolution, and if persistent, consider PET/CT and tissue sampling. Electronically signed by: Waddell Calk MD MD 06/11/2024 12:15 PM EST RP Workstation: [id number]   Procedures   Medications Ordered in the ED  cefTRIAXone  (ROCEPHIN ) 1 g in sodium chloride  0.9 % 100 mL IVPB (has no administration in time range)  azithromycin  (ZITHROMAX ) tablet 500 mg (has no administration in time range)  methylPREDNISolone  sodium succinate (SOLU-MEDROL ) 125 mg/2 mL injection 125 mg (125 mg Intravenous Given 06/11/24 1357)  magnesium  sulfate IVPB 2 g 50 mL (0 g Intravenous Stopped 06/11/24 1412)  ipratropium-albuterol  (DUONEB) 0.5-2.5 (3) MG/3ML nebulizer solution 3 mL (3 mLs Nebulization Given 06/11/24 1406)  iohexol   (OMNIPAQUE ) 350 MG/ML injection 100 mL (75 mLs Intravenous Contrast Given 06/11/24 1508)                               Medical Decision Making Amount and/or Complexity of Data Reviewed Labs: ordered. Radiology: ordered.  Risk Prescription drug management. Decision regarding hospitalization.   84 y.o. female presents to the ER for evaluation of cough, wheezing. Differential diagnosis includes but is not limited to viral illness, COVID, flu, RSV, bronchitis, PNA. Vital signs mildly elevated BP, mildly low pulse oximetry patient now on oxygen . Physical exam as noted above.   I independently reviewed and interpreted the patient's labs.  Lactic acid within normal limits.  CBC shows mild anemia with hemoglobin 11.5.  Normal white count but neutrophils are elevated 8.9.  CMP shows glucose of 228, creatinine 1.13, mildly elevated AST and ALT at 60 and 49.  No other electrolyte or LFT abnormality.  CXR shows 1. New left upper lobe mass-like opacity with possible internal cavitation measuring approximately 7.1 x 4.4 cm, which may represent primary lung malignancy or cavitary infection (including cavitary pneumonia or tuberculosis), with inflammatory etiologies less likely. 2. Recommend contrast-enhanced CT chest for further characterization and to guide management; if infection is suspected and treated, repeat chest radiograph in 6-8 weeks to document resolution, and if persistent, consider PET/CT and tissue sampling. Per radiologist's interpretation.    Will order CTA given XR results.   CTA imaging shows 1. Negative for significant acute pulmonary embolus by CTA. 2. Densely consolidative left upper lobe pneumonia and additional patchy pneumonia in the superior segment of the left lower lobe. 3. Recommend follow-up after medical therapy to document resolution. 4. Aortic atherosclerosis. Aortic Atherosclerosis. Per radiologist's interpretation.     Patient now on 3L [place] for comfort as her oxygen   saturation was trending down. She does feel better after the breathing treatment and medications. Speaking in full sentences. I have started her on antibiotics for her PNA. Will admit to medicine given new oxygen  requirement. Dr. Arlice to admit to medicine.  Portions of this report may have been transcribed using voice recognition software. Every effort was made to ensure accuracy; however, inadvertent computerized transcription errors may be present.    Final diagnoses:  Multifocal pneumonia  Acute respiratory failure with hypoxia [facility])    ED Discharge  Orders     None          Bernis Ernst, [place] 06/11/24 1730    Dreama Longs, MD 06/11/24 2239  "

## 2024-06-11 NOTE — Telephone Encounter (Signed)
 Recommended ED. Patient requesting if PCP will consider increasing dose of inhaler or changing medications as discussed in last OV. Please advise.

## 2024-06-11 NOTE — Progress Notes (Signed)
" °  Facility requesting transfer: [facility] Requesting Provider: Dr. Jerrol Reason for transfer: Pneumonia  Facility course:  [name] is a 84 y.o. female with PMH significant for DM2, HTN, HLD, CKD, hx of PE  Patient presented to ED at [facility] today with complaint of cough, congestion and wheezing for last 2 days.  No fever, breathing on 3 L oxygen  Tablets count normal, respiratory virus panel unremarkable. CT angio chest  -Negative for significant acute pulmonary embolus by CTA. -Densely consolidative left upper lobe pneumonia and additional patchy pneumonia in the superior segment of the left lower lobe.  Started on IV Rocephin , azithromycin  Hospitalist service consulted for inpatient management  Currently on 3 L oxygen  Will accept in inpatient telemetry bed.  Please note that [facility] will assume the care once patient arrives to the hospital. Prior to that, please reach out to the ED provider on site for any medical need.   Signed, Chapman Rota, MD [facility] 06/11/2024  "

## 2024-06-11 NOTE — ED Triage Notes (Signed)
 C/o cough, congestion, and wheezing x 2 days.

## 2024-06-12 ENCOUNTER — Encounter (HOSPITAL_COMMUNITY): Payer: Self-pay | Admitting: Internal Medicine

## 2024-06-12 DIAGNOSIS — I700 Atherosclerosis of aorta: Secondary | ICD-10-CM | POA: Diagnosis present

## 2024-06-12 DIAGNOSIS — Z66 Do not resuscitate: Secondary | ICD-10-CM | POA: Diagnosis present

## 2024-06-12 DIAGNOSIS — E1140 Type 2 diabetes mellitus with diabetic neuropathy, unspecified: Secondary | ICD-10-CM | POA: Diagnosis present

## 2024-06-12 DIAGNOSIS — E7849 Other hyperlipidemia: Secondary | ICD-10-CM | POA: Diagnosis present

## 2024-06-12 DIAGNOSIS — Z7984 Long term (current) use of oral hypoglycemic drugs: Secondary | ICD-10-CM | POA: Diagnosis not present

## 2024-06-12 DIAGNOSIS — Z8249 Family history of ischemic heart disease and other diseases of the circulatory system: Secondary | ICD-10-CM | POA: Diagnosis not present

## 2024-06-12 DIAGNOSIS — Z79899 Other long term (current) drug therapy: Secondary | ICD-10-CM | POA: Diagnosis not present

## 2024-06-12 DIAGNOSIS — J4521 Mild intermittent asthma with (acute) exacerbation: Secondary | ICD-10-CM | POA: Diagnosis present

## 2024-06-12 DIAGNOSIS — Z96643 Presence of artificial hip joint, bilateral: Secondary | ICD-10-CM | POA: Diagnosis present

## 2024-06-12 DIAGNOSIS — K219 Gastro-esophageal reflux disease without esophagitis: Secondary | ICD-10-CM | POA: Diagnosis present

## 2024-06-12 DIAGNOSIS — R918 Other nonspecific abnormal finding of lung field: Secondary | ICD-10-CM | POA: Diagnosis not present

## 2024-06-12 DIAGNOSIS — F32A Depression, unspecified: Secondary | ICD-10-CM | POA: Diagnosis present

## 2024-06-12 DIAGNOSIS — E1169 Type 2 diabetes mellitus with other specified complication: Secondary | ICD-10-CM | POA: Diagnosis present

## 2024-06-12 DIAGNOSIS — Z1152 Encounter for screening for COVID-19: Secondary | ICD-10-CM | POA: Diagnosis not present

## 2024-06-12 DIAGNOSIS — E1122 Type 2 diabetes mellitus with diabetic chronic kidney disease: Secondary | ICD-10-CM | POA: Diagnosis present

## 2024-06-12 DIAGNOSIS — J8410 Pulmonary fibrosis, unspecified: Secondary | ICD-10-CM | POA: Diagnosis present

## 2024-06-12 DIAGNOSIS — E1165 Type 2 diabetes mellitus with hyperglycemia: Secondary | ICD-10-CM | POA: Diagnosis present

## 2024-06-12 DIAGNOSIS — N189 Chronic kidney disease, unspecified: Secondary | ICD-10-CM | POA: Diagnosis present

## 2024-06-12 DIAGNOSIS — I10 Essential (primary) hypertension: Secondary | ICD-10-CM | POA: Diagnosis not present

## 2024-06-12 DIAGNOSIS — J45901 Unspecified asthma with (acute) exacerbation: Secondary | ICD-10-CM | POA: Diagnosis not present

## 2024-06-12 DIAGNOSIS — Z833 Family history of diabetes mellitus: Secondary | ICD-10-CM | POA: Diagnosis not present

## 2024-06-12 DIAGNOSIS — Z86711 Personal history of pulmonary embolism: Secondary | ICD-10-CM | POA: Diagnosis not present

## 2024-06-12 DIAGNOSIS — I129 Hypertensive chronic kidney disease with stage 1 through stage 4 chronic kidney disease, or unspecified chronic kidney disease: Secondary | ICD-10-CM | POA: Diagnosis present

## 2024-06-12 DIAGNOSIS — J9601 Acute respiratory failure with hypoxia: Secondary | ICD-10-CM | POA: Diagnosis present

## 2024-06-12 DIAGNOSIS — Z85828 Personal history of other malignant neoplasm of skin: Secondary | ICD-10-CM | POA: Diagnosis not present

## 2024-06-12 DIAGNOSIS — J189 Pneumonia, unspecified organism: Secondary | ICD-10-CM | POA: Diagnosis present

## 2024-06-12 DIAGNOSIS — Z981 Arthrodesis status: Secondary | ICD-10-CM | POA: Diagnosis not present

## 2024-06-12 LAB — GLUCOSE, CAPILLARY
Glucose-Capillary: 176 mg/dL — ABNORMAL HIGH (ref 70–99)
Glucose-Capillary: 296 mg/dL — ABNORMAL HIGH (ref 70–99)
Glucose-Capillary: 276 mg/dL — ABNORMAL HIGH (ref 70–99)

## 2024-06-12 MED ORDER — SODIUM CHLORIDE 0.9 % IV SOLN
2.0000 g | INTRAVENOUS | Status: AC
  Administered 2024-06-12 – 2024-06-15 (×4): 2 g via INTRAVENOUS
  Filled 2024-06-12: qty 20
  Filled 2024-06-12: qty 2
  Filled 2024-06-12 (×2): qty 20

## 2024-06-12 MED ORDER — MIRABEGRON ER 25 MG PO TB24
50.0000 mg | ORAL_TABLET | Freq: Every day | ORAL | Status: DC
  Administered 2024-06-12 – 2024-06-16 (×5): 50 mg via ORAL
  Filled 2024-06-12 (×7): qty 2

## 2024-06-12 MED ORDER — INSULIN ASPART 100 UNIT/ML IJ SOLN
0.0000 [IU] | Freq: Three times a day (TID) | INTRAMUSCULAR | Status: DC
  Administered 2024-06-12: 5 [IU] via SUBCUTANEOUS
  Administered 2024-06-12 – 2024-06-13 (×2): 2 [IU] via SUBCUTANEOUS
  Administered 2024-06-13: 1 [IU] via SUBCUTANEOUS
  Administered 2024-06-13: 7 [IU] via SUBCUTANEOUS
  Administered 2024-06-14: 2 [IU] via SUBCUTANEOUS
  Filled 2024-06-12: qty 1
  Filled 2024-06-12 (×3): qty 2
  Filled 2024-06-12: qty 5
  Filled 2024-06-12: qty 7
  Filled 2024-06-12: qty 2

## 2024-06-12 MED ORDER — CYCLOBENZAPRINE HCL 10 MG PO TABS
10.0000 mg | ORAL_TABLET | Freq: Three times a day (TID) | ORAL | Status: DC | PRN
  Filled 2024-06-12: qty 1

## 2024-06-12 MED ORDER — ACETAMINOPHEN 650 MG RE SUPP: 650.0000 mg | Freq: Four times a day (QID) | RECTAL | Status: DC | PRN

## 2024-06-12 MED ORDER — FERROUS SULFATE 325 (65 FE) MG PO TABS
325.0000 mg | ORAL_TABLET | Freq: Every day | ORAL | Status: DC
  Administered 2024-06-13 – 2024-06-16 (×4): 325 mg via ORAL
  Filled 2024-06-12 (×5): qty 1

## 2024-06-12 MED ORDER — POLYETHYLENE GLYCOL 3350 17 G PO PACK: 17.0000 g | PACK | Freq: Every day | ORAL | Status: DC | PRN

## 2024-06-12 MED ORDER — AZITHROMYCIN 500 MG PO TABS
500.0000 mg | ORAL_TABLET | Freq: Every day | ORAL | Status: AC
  Administered 2024-06-13 – 2024-06-15 (×3): 500 mg via ORAL
  Filled 2024-06-12 (×3): qty 2

## 2024-06-12 MED ORDER — FENOFIBRATE 160 MG PO TABS
160.0000 mg | ORAL_TABLET | Freq: Every day | ORAL | Status: DC
  Administered 2024-06-13 – 2024-06-16 (×4): 160 mg via ORAL
  Filled 2024-06-12 (×5): qty 1

## 2024-06-12 MED ORDER — VITAMIN D 25 MCG (1000 UNIT) PO TABS
1000.0000 [IU] | ORAL_TABLET | Freq: Every day | ORAL | Status: DC
  Administered 2024-06-13 – 2024-06-16 (×4): 1000 [IU] via ORAL
  Filled 2024-06-12 (×5): qty 1

## 2024-06-12 MED ORDER — FLUTICASONE FUROATE-VILANTEROL 200-25 MCG/ACT IN AEPB
1.0000 | INHALATION_SPRAY | Freq: Every day | RESPIRATORY_TRACT | Status: DC
  Administered 2024-06-12 – 2024-06-16 (×5): 1 via RESPIRATORY_TRACT
  Filled 2024-06-12 (×2): qty 28

## 2024-06-12 MED ORDER — HYDRALAZINE HCL 20 MG/ML IJ SOLN
5.0000 mg | Freq: Four times a day (QID) | INTRAMUSCULAR | Status: DC | PRN
  Administered 2024-06-13 – 2024-06-14 (×2): 5 mg via INTRAVENOUS
  Filled 2024-06-12 (×2): qty 1

## 2024-06-12 MED ORDER — ALBUTEROL SULFATE (2.5 MG/3ML) 0.083% IN NEBU
2.5000 mg | INHALATION_SOLUTION | RESPIRATORY_TRACT | Status: DC | PRN
  Administered 2024-06-13 – 2024-06-14 (×2): 2.5 mg via RESPIRATORY_TRACT
  Filled 2024-06-12 (×3): qty 3

## 2024-06-12 MED ORDER — METHYLPREDNISOLONE SODIUM SUCC 125 MG IJ SOLR
80.0000 mg | Freq: Every day | INTRAMUSCULAR | Status: AC
  Administered 2024-06-13: 80 mg via INTRAVENOUS
  Filled 2024-06-12: qty 2

## 2024-06-12 MED ORDER — ONDANSETRON HCL 4 MG/2ML IJ SOLN: 4.0000 mg | Freq: Four times a day (QID) | INTRAMUSCULAR | Status: DC | PRN

## 2024-06-12 MED ORDER — METHYLPREDNISOLONE SODIUM SUCC 125 MG IJ SOLR
125.0000 mg | Freq: Every day | INTRAMUSCULAR | Status: AC
  Administered 2024-06-12: 125 mg via INTRAVENOUS
  Filled 2024-06-12: qty 2

## 2024-06-12 MED ORDER — ACETAMINOPHEN 500 MG PO TABS
1000.0000 mg | ORAL_TABLET | Freq: Once | ORAL | Status: AC
  Administered 2024-06-12: 1000 mg via ORAL
  Filled 2024-06-12: qty 2

## 2024-06-12 MED ORDER — TRAMADOL HCL 50 MG PO TABS: 50.0000 mg | ORAL_TABLET | Freq: Four times a day (QID) | ORAL | Status: DC | PRN

## 2024-06-12 MED ORDER — INSULIN ASPART 100 UNIT/ML IJ SOLN
0.0000 [IU] | Freq: Every day | INTRAMUSCULAR | Status: DC
  Administered 2024-06-12: 3 [IU] via SUBCUTANEOUS
  Administered 2024-06-13: 2 [IU] via SUBCUTANEOUS
  Filled 2024-06-12: qty 3
  Filled 2024-06-12: qty 2

## 2024-06-12 MED ORDER — POLYETHYLENE GLYCOL 3350 17 G PO PACK
17.0000 g | PACK | Freq: Every day | ORAL | Status: DC | PRN
  Administered 2024-06-15: 17 g via ORAL
  Filled 2024-06-12: qty 1

## 2024-06-12 MED ORDER — ONDANSETRON HCL 4 MG PO TABS
4.0000 mg | ORAL_TABLET | Freq: Four times a day (QID) | ORAL | Status: DC | PRN
  Filled 2024-06-12: qty 1

## 2024-06-12 MED ORDER — PREGABALIN 100 MG PO CAPS
100.0000 mg | ORAL_CAPSULE | Freq: Three times a day (TID) | ORAL | Status: DC
  Administered 2024-06-12 – 2024-06-16 (×10): 100 mg via ORAL
  Filled 2024-06-12: qty 2
  Filled 2024-06-12: qty 1
  Filled 2024-06-12 (×2): qty 2
  Filled 2024-06-12: qty 1
  Filled 2024-06-12: qty 2
  Filled 2024-06-12 (×3): qty 1
  Filled 2024-06-12: qty 2
  Filled 2024-06-12: qty 1
  Filled 2024-06-12: qty 2
  Filled 2024-06-12: qty 1

## 2024-06-12 MED ORDER — ACETAMINOPHEN 325 MG PO TABS
650.0000 mg | ORAL_TABLET | Freq: Four times a day (QID) | ORAL | Status: DC | PRN
  Administered 2024-06-14: 650 mg via ORAL
  Filled 2024-06-12 (×4): qty 2

## 2024-06-12 MED ORDER — HYDROCOD POLI-CHLORPHE POLI ER 10-8 MG/5ML PO SUER
5.0000 mL | Freq: Every evening | ORAL | Status: DC | PRN
  Administered 2024-06-12: 5 mL via ORAL
  Filled 2024-06-12 (×2): qty 5

## 2024-06-12 MED ORDER — IPRATROPIUM-ALBUTEROL 0.5-2.5 (3) MG/3ML IN SOLN
3.0000 mL | Freq: Four times a day (QID) | RESPIRATORY_TRACT | Status: AC
  Administered 2024-06-12 – 2024-06-13 (×4): 3 mL via RESPIRATORY_TRACT
  Filled 2024-06-12 (×4): qty 3

## 2024-06-12 MED ORDER — HEPARIN SODIUM (PORCINE) 5000 UNIT/ML IJ SOLN
5000.0000 [IU] | Freq: Three times a day (TID) | INTRAMUSCULAR | Status: DC
  Administered 2024-06-12 – 2024-06-14 (×6): 5000 [IU] via SUBCUTANEOUS
  Filled 2024-06-12 (×6): qty 1

## 2024-06-12 MED ORDER — EZETIMIBE 10 MG PO TABS
10.0000 mg | ORAL_TABLET | Freq: Every day | ORAL | Status: DC
  Administered 2024-06-13 – 2024-06-16 (×4): 10 mg via ORAL
  Filled 2024-06-12 (×5): qty 1

## 2024-06-12 MED ORDER — IPRATROPIUM-ALBUTEROL 0.5-2.5 (3) MG/3ML IN SOLN
3.0000 mL | RESPIRATORY_TRACT | Status: AC
  Administered 2024-06-12: 3 mL via RESPIRATORY_TRACT
  Filled 2024-06-12: qty 3

## 2024-06-12 MED ORDER — AZITHROMYCIN 250 MG PO TABS: 500.0000 mg | ORAL_TABLET | Freq: Every day | ORAL | Status: DC

## 2024-06-12 MED ORDER — MELATONIN 5 MG PO TABS
5.0000 mg | ORAL_TABLET | Freq: Every evening | ORAL | Status: DC | PRN
  Administered 2024-06-12 – 2024-06-14 (×2): 5 mg via ORAL
  Filled 2024-06-12 (×3): qty 1

## 2024-06-12 MED ORDER — SODIUM CHLORIDE 0.9 % IV SOLN: INTRAVENOUS | Status: AC

## 2024-06-12 NOTE — Assessment & Plan Note (Addendum)
 SBP in 160s  Will add on low dose norvasc  and titrate

## 2024-06-12 NOTE — Assessment & Plan Note (Signed)
 Home ezetimibe  10 mg daily, fenofibrate  160 mg daily resumed

## 2024-06-12 NOTE — Assessment & Plan Note (Addendum)
 Blood sugar in 150s  Will monitor w/ steroid use  BID blood sugars on [facility] program  Consider low dose lantus vs. Glipizide for acute treatment as appropriate  CBG last 2 checks have been in the 150s Change CBG check to twice daily Goal inpatient blood glucose levels 140-180

## 2024-06-12 NOTE — H&P (Signed)
 " History and Physical - Telemedicine  [name]:[id number] DOB: 1940-11-21 DOA: 06/11/2024  PCP: Antonio Cyndee [name], DO  Patient coming from: home  Referring provider: Carlo Cornish, EDP PA Telemedicine provider: Dr. Sherre Patient location: [facility] Referring diagnosis: Multilobar pneumonia Patient name and DOB verified: Patient was able to verify her first and last name: [name], date of birth: 09/05/1940. Patient consented to Telemedicine Evaluation: yes RN virtual assistant: Carlin Salt, RN Video encounter time and date: 06/12/2024 and at approximately: 09:55a  Chief Concern: shortness of breath and wheezing  HPI: [name] is an 84 year old female with history of hypertension, hyperlipidemia, neuropathy, non-insulin -dependent diabetes mellitus type 2 on metformin , history of PE, aortic atherosclerosis, mild intermittent asthma.  06/11/24: Patient presented to the ED for chief concerns of shortness of breath cough and wheezing since Sunday evening/Monday morning.  1/13: [facility] admitted [facility] for chief concerns of multilobar pneumonia, pending bed availability.  Vitals in the ED showed Tmax of 101.2, rr of 17, hr 86, blood pressure 146/110, SpO2 of 93% on room air.  Serum sodium is 135, potassium 4.3, chloride 101, bicarb 23, BUN of 14, serum creatinine 1.13, eGFR 48, nonfasting blood glucose 228, AST 60, ALT 49, alk phos is 70, hs troponin is <15 x 2.  Lactic acid was not elevated.  COVID/influenza A/influenza B/RSV PCR were negative.  ED treatment: Azithromycin  500 mg p.o. one-time dose, ceftriaxone  1 g IV one-time dose, DuoNeb treatment, Solu-Medrol  125 mg IV one-time dose, magnesium  2 g IV one-time dose.  06/12/2024: I assumed care of patient.  ------------------------ At bedside, via telemedicine encounter, patient was able to confirm her first month name, her date of birth.  She reports that she has been having shortness of breath and  wheezing since Sunday evening or Monday morning.  She denies known sick contacts.  She reports cough that is nonproductive.  She denies fever, nausea, vomiting, watery bowel movements, dysuria, blood in her stool, swelling, loss of consciousness.  She endorses 2 episodes of loose stools yesterday.  Social history: She lives at independent living facility, [place] apartment.  She denies tobacco, recreational drug use.  She occasionally has EtOH, she drinks wine Wednesday night and sometimes Monday night when she has friends over.  Her max alcoholic beverages 2 glasses of wine on those nights.  She is retired.  She previously worked for [employer].  ROS: Constitutional: no weight change, no fever ENT/Mouth: no sore throat, no rhinorrhea Eyes: no eye pain, no vision changes Cardiovascular: no chest pain, + dyspnea,  no edema, no palpitations Respiratory: + cough, no sputum, + wheezing Gastrointestinal: no nausea, no vomiting, no diarrhea, no constipation Genitourinary: no urinary incontinence, no dysuria, no hematuria Musculoskeletal: no arthralgias, no myalgias Skin: no skin lesions, no pruritus, Neuro: + weakness, no loss of consciousness, no syncope Psych: no anxiety, no depression, no decrease appetite Heme/Lymph: no bruising, no bleeding  Assessment/Plan  Principal Problem:   Multilobar lung infiltrate Active Problems:   Asthma, mild intermittent   Type 2 diabetes mellitus with hyperglycemia, without long-term current use of insulin  (HCC)   History of pulmonary embolus (PE)   Essential hypertension   Hyperlipidemia associated with type 2 diabetes mellitus (HCC)   Type 2 diabetes mellitus with diabetic neuropathy (HCC)   GERD (gastroesophageal reflux disease)   Assessment and Plan:  * Multilobar lung infiltrate In the left upper lobe and superior segment of the left lower lobe Continue with ceftriaxone  2 g IV  daily, azithromycin  500 mg PO daily to complete a 5-day  course IS, flutter valve  Asthma, mild intermittent with acute exacerbation Home Breo Ellipta  inhaler daily resumed DuoNebs 4 times daily, 4 doses scheduled Albuterol  nebulizer every 4 hours as needed for wheezing and shortness of breath, 5 days ordered Incentive spirometry, flutter valve  Type 2 diabetes mellitus with hyperglycemia, without long-term current use of insulin  (HCC) Home metformin  will not be resumed on admission Insulin  SSI with at bedtime coverage ordered Goal inpatient blood glucose levels 140-180  Hyperlipidemia associated with type 2 diabetes mellitus (HCC) Home ezetimibe  10 mg daily, fenofibrate  160 mg daily resumed  Essential hypertension Hydralazine  5 mg IV every 6 hours as needed for SBP > 165, 5 days ordered  Type 2 diabetes mellitus with diabetic neuropathy (HCC) PDMP reviewed Home pregabalin  100 mg 3 times daily resumed  Chart reviewed.   DVT prophylaxis: Heparin  5000 units subcutaneous every 8 hours Code Status: DNR/DNI Diet: Heart healthy Family Communication: A phone call was offered, patient declined stating that her son was just here and knows she is being admitted to the hospital Disposition Plan: Pending clinical course Consults called: None at this time Admission status: Telemetry, inpatient  Past Medical History:  Diagnosis Date   Anemia    Arthritis    Asthma    adult onset   Basal cell cancer    LUE; Porokeratosis also   Chronic kidney disease 1963   strep in kidney due to strep throat-hospitalized 10 days   Complication of anesthesia    small trachea   Diabetes mellitus 2010   A1c 6.7%   DVT (deep venous thrombosis) (HCC) 2006   post immobilization post cns surgery   GERD (gastroesophageal reflux disease)    very mild   Granulomatous lung disease (HCC) 2002   incidental Xray finding   Hyperlipidemia    Hypertension 2004   Hypertensive response on Stress Test   Paralysis (HCC) 2006   post cervical fusion with spinal sac  tear  with hematoma    PTE (pulmonary thromboembolism) (HCC) 2006   Past Surgical History:  Procedure Laterality Date   ABDOMINAL EXPOSURE N/A 08/12/2016   Procedure: ABDOMINAL EXPOSURE;  Surgeon: Krystal JULIANNA Doing, MD;  Location: [facility] OR;  Service: Vascular;  Laterality: N/A;   ANTERIOR LAT LUMBAR FUSION N/A 08/12/2016   Procedure: LUMBAR TWO-THREE, LUMBAR THREE-FOUR, LUMBAR FOUR-FIVE  ANTEROLATERAL LUMBAR INTERBODY FUSION;  Surgeon: Fairy Levels, MD;  Location: [facility] OR;  Service: Neurosurgery;  Laterality: N/A;  L2-3 L3-4 L4-5 Anterolateral lumbar interbody fusion   ANTERIOR LUMBAR FUSION N/A 08/12/2016   Procedure: Lumbar Five-Sacral One Anterior lumbar interbody fusion with Dr. Krystal Early to assist;  Surgeon: Fairy Levels, MD;  Location: [facility] OR;  Service: Neurosurgery;  Laterality: N/A;  L5-S1 Anterior lumbar interbody fusion with Dr. Krystal Early to assist   BUNIONECTOMY     CATARACT EXTRACTION Right 12/31/2018   CATARACT EXTRACTION Left 12/17/2018   CERVICAL FUSION  2006   Dr Louis, [place];post op hematoma & cns leak & urinary retention   COLONOSCOPY  1992 & 2002   negative   epidural steroids  2006   cervical spine   LUMBAR PERCUTANEOUS PEDICLE SCREW 4 LEVEL N/A 08/12/2016   Procedure: LUMBAR TWO-SACRAL ONE Percuataneous Pedicle Screws;  Surgeon: Fairy Levels, MD;  Location: [facility] OR;  Service: Neurosurgery;  Laterality: N/A;   ROTATOR CUFF REPAIR  2009   Right   SEPTOPLASTY     TONSILLECTOMY  84 years old  TOTAL HIP ARTHROPLASTY  06/01/2012   Procedure: TOTAL HIP ARTHROPLASTY ANTERIOR APPROACH;  Surgeon: Lonni CINDERELLA Poli, MD;  Location: [facility] ORS;  Service: Orthopedics;  Laterality: Left;  Left Total Hip Arthroplasty   TOTAL HIP ARTHROPLASTY Right 02/23/2018   Procedure: RIGHT TOTAL HIP ARTHROPLASTY ANTERIOR APPROACH;  Surgeon: Poli Lonni CINDERELLA, MD;  Location: [facility] ORS;  Service: Orthopedics;  Laterality: Right;   TUBAL LIGATION     Social History:  reports that she has never smoked. She has  been exposed to tobacco smoke. She has never used smokeless tobacco. She reports current alcohol use of about 1.0 standard drink of alcohol per week. She reports that she does not use drugs.  Allergies[1] Family History  Problem Relation Age of Onset   COPD Father        emphysema   Cancer Mother        cns cancer   Diabetes Sister        TWIN sister ; also Fibromyalgia ; S/P stent 2004   Heart disease Sister        stents @ 42 & 8   Stroke Maternal Grandmother        in  late 54s   Transient ischemic attack Paternal Aunt    Family history: Family history reviewed and not pertinent.  Prior to Admission medications  Medication Sig Start Date End Date Taking? Authorizing Provider  Accu-Chek Softclix Lancets lancets USE TO TEST BLOOD SUGAR TWICE DAILY AS DIRECTED 06/11/21   Antonio Meth, Jamee SAUNDERS, DO  albuterol  (PROVENTIL ) (2.5 MG/3ML) 0.083% nebulizer solution Take 3 mLs (2.5 mg total) by nebulization every 6 (six) hours as needed for wheezing or shortness of breath. Patient not taking: Reported on 04/08/2024 11/23/21   Neysa Rama D, MD  albuterol  (VENTOLIN  HFA) 108 [phone] Base) MCG/ACT inhaler USE 2 INHALATIONS BY MOUTH  EVERY 4 TO 6 HOURS AS  NEEDED 05/29/23   Neysa Rama BIRCH, MD  albuterol  (VENTOLIN  HFA) 108 (90 Base) MCG/ACT inhaler Inhale 1-2 puffs into the lungs every 6 (six) hours as needed for wheezing or shortness of breath. 08/25/23   Ula Prentice SAUNDERS, MD  azelastine  (ASTELIN ) 0.1 % nasal spray Use 1-2 puffs in each nostril once or twice daily 11/23/21   Neysa Rama BIRCH, MD  blood glucose meter kit and supplies KIT Dispense based on patient and insurance preference. Use up to four times daily as directed. 08/11/20   Lowne Chase, Yvonne R, DO  BREO ELLIPTA  100-25 MCG/ACT AEPB Inhale 1 puff into the lungs daily. 04/16/24   Neysa Rama BIRCH, MD  Calcium Citrate-Vitamin D  (CALCIUM + D PO) Take 600 mg by mouth in the morning and at bedtime.    [provider]  celecoxib   (CELEBREX ) 200 MG capsule TAKE 1 CAPSULE(200 MG) BY MOUTH DAILY 05/17/24   Antonio Meth, Yvonne R, DO  cetirizine (ZYRTEC) 10 MG tablet Take 10 mg by mouth daily as needed for allergies.    [provider]  cholecalciferol  (VITAMIN D3) 25 MCG (1000 UNIT) tablet Take 1,000 Units by mouth daily.    [provider]  clotrimazole -betamethasone  (LOTRISONE ) cream Apply 1 Application topically daily. 10/30/23   Antonio Meth Jamee R, DO  cyclobenzaprine  (FLEXERIL ) 10 MG tablet TAKE 1 TABLET BY MOUTH THREE TIMES DAILY AS NEEDED FOR HIP CONCERNS 11/21/23   Antonio Meth, Yvonne R, DO  diclofenac Sodium (VOLTAREN) 1 % GEL Apply topically daily as needed (Pain).    [provider]  ezetimibe  (ZETIA ) 10 MG  tablet Take 1 tablet (10 mg total) by mouth daily. 05/17/24   Antonio Cyndee Rockers R, DO  fenofibrate  160 MG tablet TAKE 1 TABLET(160 MG) BY MOUTH DAILY 01/01/24   Hilty, Vinie BROCKS, MD  FEROSUL 325 (65 Fe) MG tablet TAKE 1 TABLET(325 MG) BY MOUTH DAILY WITH BREAKFAST 11/21/23   Antonio Cyndee, Yvonne R, DO  fluconazole  (DIFLUCAN ) 150 MG tablet Take 1 tablet (150 mg total) by mouth daily. May repeat in 3 days if needed. Patient not taking: Reported on 04/08/2024 10/30/23   Antonio Cyndee, Rockers SAUNDERS, DO  fluticasone  furoate-vilanterol (BREO ELLIPTA ) 200-25 MCG/ACT AEPB Inhale 1 puff into the lungs daily. 06/11/24   Antonio Cyndee Rockers R, DO  glucose blood test strip Use to check sugars twice daily 08/10/23   Antonio Cyndee, Rockers SAUNDERS, DO  Lidocaine  4 % PTCH Apply 1 patch topically as needed (back pain).    [provider]  metFORMIN  (GLUCOPHAGE -XR) 500 MG 24 hr tablet Take 1 tablet with morning meal, 1 tablet with lunch and 2 tablets with evening meal. 05/25/23   Antonio Cyndee, Yvonne R, DO  methocarbamol  (ROBAXIN ) 500 MG tablet TAKE 1 TABLET(500 MG) BY MOUTH EVERY 6 HOURS AS NEEDED FOR MUSCLE SPASMS 04/19/24   Lowne Chase, Yvonne R, DO  mirabegron  ER (MYRBETRIQ ) 50 MG TB24 tablet TAKE 1 TABLET(50 MG)  BY MOUTH DAILY 01/03/23   Lowne Chase, Yvonne R, DO  Multiple Vitamin (MULTIVITAMIN WITH MINERALS) TABS tablet Take 1 tablet by mouth daily. Centrum One a day - 50+    [provider]  Multiple Vitamins-Minerals (PRESERVISION AREDS 2+MULTI VIT PO) Take 1 tablet by mouth in the morning and at bedtime.    [provider]  mupirocin  ointment (BACTROBAN ) 2 % Apply 1 Application topically 3 (three) times daily. 10/11/23   Cyndi Shaver, PA-C  Polyethyl Glycol-Propyl Glycol (SYSTANE OP) Place 1 drop into both eyes daily as needed (dry eyes).    [provider]  polyethylene glycol (MIRALAX  / GLYCOLAX ) packet Take 17 g by mouth daily as needed for mild constipation.     [provider]  pregabalin  (LYRICA ) 100 MG capsule TAKE 1 CAPSULE BY MOUTH THREE TIMES DAILY 02/19/24   Antonio Cyndee, Yvonne R, DO  traMADol  (ULTRAM ) 50 MG tablet Take 1 tablet (50 mg total) by mouth every 6 (six) hours as needed. 06/03/24   Lowne Chase, Yvonne R, DO  Trolamine Salicylate (ASPERCREME EX) Apply 1 application topically daily as needed (back pain).    [provider]  VASCEPA  1 g capsule TAKE 2 CAPSULES(2 GRAMS) BY MOUTH TWICE DAILY 07/24/23   Hilty, Vinie BROCKS, MD   Physical Exam completed with assistance of: Carlin Salt, RN, who was at bedside during this portion of the virtual encounter:  Vitals:   06/12/24 0811 06/12/24 1101 06/12/24 1130 06/12/24 1153  BP: (!) 146/110 114/71    Pulse: 86 72    Resp: 17 20    Temp: (!) 101.2 F (38.4 C) 98.2 F (36.8 C)    TempSrc: Oral Oral    SpO2: 93% 97%  95%  Weight:   70.3 kg   Height:   5' 3 (1.6 m)    Constitutional: appears age appropriate, frail, mild discomfort Eyes: EOMI,  conjunctivae normal ENMT: Mucous membranes are moist. Hearing appropriate Neck: normal, supple, no masses, no thyromegaly Respiratory: Diminished lung sounds in bilateral bases. No wheezing. Increased respiratory effort.  Increased accessory muscle use  especially with talking.  Cardiovascular: Regular rate and rhythm,  no murmurs. No extremity edema. 2+ pedal pulses. Abdomen: no tenderness. Bowel sounds positive.  Musculoskeletal: No joint deformity upper and lower extremities. Good ROM, no contractures, no atrophy. Skin: no rashes, ulcers on visible skin Neurologic: Strength is appropriate upper extremities.  Psychiatric: Normal judgment and insight. Alert and oriented x 3. Normal mood.   EKG: independently reviewed, showing sinus rhythm with rate of 82, QTc 440  Chest x-ray on Admission: I personally reviewed and I agree with radiologist reading as below.  CT Angio Chest PE W and/or Wo Contrast Result Date: 06/11/2024 CLINICAL DATA:  Cough, congestion wheezing EXAM: CT ANGIOGRAPHY CHEST WITH CONTRAST TECHNIQUE: Multidetector CT imaging of the chest was performed using the standard protocol during bolus administration of intravenous contrast. Multiplanar CT image reconstructions and MIPs were obtained to evaluate the vascular anatomy. RADIATION DOSE REDUCTION: This exam was performed according to the departmental dose-optimization program which includes automated exposure control, adjustment of the mA and/or kV according to patient size and/or use of iterative reconstruction technique. CONTRAST:  75mL OMNIPAQUE  IOHEXOL  350 MG/ML SOLN COMPARISON:  05/11/2021 CTA chest 06/11/2024 chest x-ray FINDINGS: Cardiovascular: Intact thoracic aorta without aneurysm or dissection. No acute aortic process, mediastinal hemorrhage or hematoma. Patent 3 vessel arch anatomy. Aortic atherosclerosis present. Central pulmonary arteries are normal in caliber and patent. No significant filling defect or pulmonary embolus by CTA. Normal heart size. No pericardial effusion. Central venous structures are patent. No veno-occlusive process. Mediastinum/Nodes: Thyroid  unremarkable. Trachea and central airways are patent. Esophagus nondilated. Small hiatal hernia noted. Mildly  prominent scattered mediastinal, hilar and subcarinal lymph nodes, short axis measurements all 9 mm or less favored to be reactive. No abnormal bulky adenopathy. Lungs/Pleura: Densely consolidative left upper lobe airspace opacity posteriorly abutting the fissure with central air bronchograms, consistent with consolidative pneumonia. This correlates with the chest x-ray finding. Additional areas of bronchovascular patchy airspace opacity in the superior segment of the left lower lobe also compatible with pneumonia. Minor dependent basilar atelectasis in both lower lobes. No pleural abnormality, effusion, or pneumothorax. Upper Abdomen: Tiny layering gallstones noted, some are calcified. No large focal hepatic abnormality. Abdominal aorta atherosclerotic. No acute upper abdominal finding. Musculoskeletal: Degenerative changes noted throughout the spine and shoulders. Review of the MIP images confirms the above findings. IMPRESSION: 1. Negative for significant acute pulmonary embolus by CTA. 2. Densely consolidative left upper lobe pneumonia and additional patchy pneumonia in the superior segment of the left lower lobe. 3. Recommend follow-up after medical therapy to document resolution. 4. Aortic atherosclerosis. Aortic Atherosclerosis (ICD10-I70.0). Electronically Signed   By: CHRISTELLA.  Shick M.D.   On: 06/11/2024 15:41   DG Chest 2 View Result Date: 06/11/2024 EXAM: 2 VIEW(S) XRAY OF THE CHEST 06/11/2024 11:50:00 AM COMPARISON: 11/27/2023 CLINICAL HISTORY: The patient presents with shortness of breath. FINDINGS: LUNGS AND PLEURA: New left upper lobe mass-like opacity with possible internal cavitation measuring approximately 7.1 x 4.4 cm. No pleural effusion. No pneumothorax. HEART AND MEDIASTINUM: No acute abnormality of the cardiac and mediastinal silhouettes. BONES AND SOFT TISSUES: Surgical hardware in lower cervical and lumbar spine partially included. No acute osseous abnormality. IMPRESSION: 1. New left upper  lobe mass-like opacity with possible internal cavitation measuring approximately 7.1 x 4.4 cm, which may represent primary lung malignancy or cavitary infection (including cavitary pneumonia or tuberculosis), with inflammatory etiologies less likely. 2. Recommend contrast-enhanced CT chest for further characterization and to guide management; if infection is suspected and treated, repeat chest radiograph in 6-8 weeks to document resolution, and  if persistent, consider PET/CT and tissue sampling. Electronically signed by: Waddell Calk MD MD 06/11/2024 12:15 PM EST RP Workstation: [id number]   Labs on Admission: I have personally reviewed following labs.  CBC: Recent Labs  Lab 06/11/24 1328  WBC 10.3  NEUTROABS 8.9*  HGB 11.5*  HCT 34.9*  MCV 87.3  PLT 189   Basic Metabolic Panel: Recent Labs  Lab 06/11/24 1328  NA 135  K 4.3  CL 101  CO2 23  GLUCOSE 228*  BUN 14  CREATININE 1.13*  CALCIUM 9.9   GFR: Estimated Creatinine Clearance: 35.5 mL/min (A) (by C-G formula based on SCr of 1.13 mg/dL (H)).  Liver Function Tests: Recent Labs  Lab 06/11/24 1328  AST 60*  ALT 49*  ALKPHOS 70  BILITOT 0.4  PROT 6.7  ALBUMIN 3.8   CBG: Recent Labs  Lab 06/12/24 1106  GLUCAP 176*   Urine analysis:    Component Value Date/Time   COLORURINE STRAW (A) 07/06/2018 1654   APPEARANCEUR CLEAR 07/06/2018 1654   LABSPEC 1.004 (L) 07/06/2018 1654   PHURINE 6.0 07/06/2018 1654   GLUCOSEU NEGATIVE 07/06/2018 1654   GLUCOSEU NEGATIVE 06/03/2016 1213   HGBUR NEGATIVE 07/06/2018 1654   BILIRUBINUR negative 10/30/2023 1343   KETONESUR NEGATIVE 07/06/2018 1654   PROTEINUR Positive (A) 10/30/2023 1343   PROTEINUR NEGATIVE 07/06/2018 1654   UROBILINOGEN 0.2 10/30/2023 1343   UROBILINOGEN 0.2 06/03/2016 1213   NITRITE negative 10/30/2023 1343   NITRITE NEGATIVE 07/06/2018 1654   LEUKOCYTESUR Trace (A) 10/30/2023 1343   This document was prepared using Dragon Voice Recognition software  and may include unintentional dictation errors.  Dr. Sherre [facility] Location: [place]  If 7PM-7AM, please contact overnight-coverage provider If 7AM-7PM, please contact day attending provider [email or web address]  06/12/2024, 12:49 PM       [1]  Allergies Allergen Reactions   Statins Other (See Comments)    Myalgias and muscle weakness   Jardiance  [Empagliflozin ] Rash   "

## 2024-06-12 NOTE — Hospital Course (Addendum)
 [name] is an 84 year old female with history of hypertension, hyperlipidemia, neuropathy, non-insulin -dependent diabetes mellitus type 2 on metformin , history of PE, aortic atherosclerosis, mild intermittent asthma.  06/11/24: Patient presented to the ED for chief concerns of shortness of breath cough and wheezing since Sunday evening/Monday morning.  1/13: Patient admitted to [facility] service for chief concerns of multilobar pneumonia, pending bed availability.  Vitals in the ED showed Tmax of 101.2, rr of 17, hr 86, blood pressure 146/110, SpO2 of 93% on room air.  Serum sodium is 135, potassium 4.3, chloride 101, bicarb 23, BUN of 14, serum creatinine 1.13, eGFR 48, nonfasting blood glucose 228, AST 60, ALT 49, alk phos is 70, hs troponin is <15 x 2.  Lactic acid was not elevated.  COVID/influenza A/influenza B/RSV PCR were negative.  ED treatment: Azithromycin  500 mg p.o. one-time dose, ceftriaxone  1 g IV one-time dose, DuoNeb treatment, Solu-Medrol  125 mg IV one-time dose, magnesium  2 g IV one-time dose.  06/12/2024: I assumed care of patient.   06/14/2024: Patient transferred to [facility] service. Day 4 of abx. Anticipate discharge 1/17 PM or 1/18 AM.

## 2024-06-12 NOTE — Assessment & Plan Note (Signed)
 Home metformin will not be resumed on admission Insulin SSI with at bedtime coverage ordered Goal inpatient blood glucose levels 140-180

## 2024-06-12 NOTE — ED Notes (Signed)
 Called George at [facility] for transport 09:57-TC

## 2024-06-12 NOTE — Assessment & Plan Note (Addendum)
 with acute exacerbation Home Breo Ellipta  inhaler daily resumed Cont IV solumedrol 80mg  x1 for-->transition to oral prednisone  in am  DuoNebs 4 times daily scheduled  Albuterol  nebulizer every 4 hours as needed for wheezing and shortness of breath, 5 days ordered Incentive spirometry, flutter valve  06/15/2024: Patient improving.  She is requesting DuoNebs be changed from scheduled to every 6 hours as needed for wheezing and shortness of breath.  DME nebulizer machine placed.

## 2024-06-12 NOTE — Assessment & Plan Note (Addendum)
 Acute respiratory failure with hypoxia  Decompensated resp status in setting of multilobar PNA and asthma exacerbation  Currently on 2-3L [place] with O2 sats in mid 90s  Admission with left upper lobe and superior segment of the left lower lobe Continue with ceftriaxone  2 g IV daily, azithromycin  500 mg PO daily to complete a 5-day course IS, flutter valve Continue supplemental O2  Asthma exacerbation treatment as noted above  1/18: DME oxygen  supplementation order placed for 2 L [place].

## 2024-06-12 NOTE — Assessment & Plan Note (Deleted)
 Home ezetimibe  10 mg daily, fenofibrate  160 mg daily resumed

## 2024-06-13 DIAGNOSIS — J4521 Mild intermittent asthma with (acute) exacerbation: Secondary | ICD-10-CM

## 2024-06-13 DIAGNOSIS — E1165 Type 2 diabetes mellitus with hyperglycemia: Secondary | ICD-10-CM

## 2024-06-13 DIAGNOSIS — Z86711 Personal history of pulmonary embolism: Secondary | ICD-10-CM

## 2024-06-13 DIAGNOSIS — J9601 Acute respiratory failure with hypoxia: Secondary | ICD-10-CM | POA: Insufficient documentation

## 2024-06-13 LAB — CBC
HCT: 33.8 % — ABNORMAL LOW (ref 36.0–46.0)
Hemoglobin: 11.1 g/dL — ABNORMAL LOW (ref 12.0–15.0)
MCH: 28.9 pg (ref 26.0–34.0)
MCHC: 32.8 g/dL (ref 30.0–36.0)
MCV: 88 fL (ref 80.0–100.0)
Platelets: 232 K/uL (ref 150–400)
RBC: 3.84 MIL/uL — ABNORMAL LOW (ref 3.87–5.11)
RDW: 15.1 % (ref 11.5–15.5)
WBC: 16 K/uL — ABNORMAL HIGH (ref 4.0–10.5)
nRBC: 0 % (ref 0.0–0.2)

## 2024-06-13 LAB — GLUCOSE, CAPILLARY
Glucose-Capillary: 138 mg/dL — ABNORMAL HIGH (ref 70–99)
Glucose-Capillary: 196 mg/dL — ABNORMAL HIGH (ref 70–99)
Glucose-Capillary: 304 mg/dL — ABNORMAL HIGH (ref 70–99)
Glucose-Capillary: 231 mg/dL — ABNORMAL HIGH (ref 70–99)

## 2024-06-13 LAB — BASIC METABOLIC PANEL WITH GFR
Anion gap: 11 (ref 5–15)
BUN: 18 mg/dL (ref 8–23)
CO2: 19 mmol/L — ABNORMAL LOW (ref 22–32)
Calcium: 9.4 mg/dL (ref 8.9–10.3)
Chloride: 109 mmol/L (ref 98–111)
Creatinine, Ser: 0.94 mg/dL (ref 0.44–1.00)
GFR, Estimated: 60 mL/min — ABNORMAL LOW
Glucose, Bld: 208 mg/dL — ABNORMAL HIGH (ref 70–99)
Potassium: 4.4 mmol/L (ref 3.5–5.1)
Sodium: 138 mmol/L (ref 135–145)

## 2024-06-13 MED ORDER — HYDROCOD POLI-CHLORPHE POLI ER 10-8 MG/5ML PO SUER
5.0000 mL | Freq: Two times a day (BID) | ORAL | Status: DC | PRN
  Administered 2024-06-13 (×2): 5 mL via ORAL
  Filled 2024-06-13: qty 5

## 2024-06-13 MED ORDER — GUAIFENESIN ER 600 MG PO TB12
1200.0000 mg | ORAL_TABLET | Freq: Two times a day (BID) | ORAL | Status: DC
  Administered 2024-06-13 – 2024-06-16 (×7): 1200 mg via ORAL
  Filled 2024-06-13 (×11): qty 2

## 2024-06-13 NOTE — Evaluation (Signed)
 Physical Therapy Evaluation Patient Details Name: [name] MRN: [id number] DOB: 1940/06/16 Today's Date: 06/13/2024  History of Present Illness  [name] is an 84 y.o. female admitted with multilobar lung infiltrate. CT angio chest: 1. Negative for significant acute pulmonary embolus by CTA. 2. Densely consolidative left upper lobe pneumonia and additional patchy pneumonia in the superior segment of the left lower lobe.   PMH : DM2, HTN, HLD, CKD, hx of PE, DVT  Clinical Impression  Pt admitted with above diagnosis. PTA, pt ind with in household and community ambulation without AD, using hiking stick while walking for exercise outside at the park, ind with self care and simple household chores, received weekly cleaning services, partakes in meals at [employer]. On eval, pt amb around room, intermittent reaching to furniture in room to steady self, dyspnea noted, on RA SpO2 87%. Amb out into hallway, after ~40 ft pt reports panic attack RN in hallway stepping over to assist as well, pt assisted back to room with min A and HHA, cued for slow, pursed lip breathing. On 2L O2 pt SpO2 96% with exertion. Pt coughing intermittently during session. Anticipate good progress acutely, recommend HHPT with return to ILF at d/c. Pt currently with functional limitations due to the deficits listed below (see PT Problem List). Pt will benefit from acute skilled PT to increase their independence and safety with mobility to allow discharge.           If plan is discharge home, recommend the following: A little help with walking and/or transfers;A little help with bathing/dressing/bathroom;Assistance with cooking/housework;Assist for transportation   Can travel by private vehicle        Equipment Recommendations None recommended by PT  Recommendations for Other Services       Functional Status Assessment Patient has had a recent decline in their functional status and demonstrates the ability to make  significant improvements in function in a reasonable and predictable amount of time.     Precautions / Restrictions Precautions Precautions: Fall Precaution/Restrictions Comments: monitor O2 Restrictions Weight Bearing Restrictions Per Provider Order: No      Mobility  Bed Mobility               General bed mobility comments: pt received amb from bathroom to recliner    Transfers Overall transfer level: Needs assistance Equipment used: None Transfers: Sit to/from Stand Sit to Stand: Supervision           General transfer comment: supv for safety, therapist managing O2 tubing    Ambulation/Gait Ambulation/Gait assistance: Contact guard assist, Min assist Gait Distance (Feet): 80 Feet Assistive device: None, 1 person hand held assist Gait Pattern/deviations: Step-through pattern, Decreased stride length       General Gait Details: step through gait pattern, decreased but functional cadence, no AD, pt stops suddenly after 40 ft and reports panic attack reaches out to therapist to steady self, RN in hallway comes to pt's side, min A to return to room, max cues for slow, pursed lip breathing, therapist managing O2 tank/tubing throughout; on RA SpO2 87%, on 2L SpO2 96% with ambulation  Stairs            Wheelchair Mobility     Tilt Bed    Modified Rankin (Stroke Patients Only)       Balance Overall balance assessment: Needs assistance Sitting-balance support: Feet supported Sitting balance-Leahy Scale: Good Sitting balance - Comments: able to don shoes in sitting in recliner  Standing balance support: During functional activity Standing balance-Leahy Scale: Fair Standing balance comment: initial no UE support, needing single UE support with min A with exertion                             Pertinent Vitals/Pain Pain Assessment Pain Assessment: Faces Faces Pain Scale: Hurts a little bit Pain Location: head when coughing Pain  Descriptors / Indicators: Discomfort Pain Intervention(s): Limited activity within patient's tolerance, Monitored during session    Home Living Family/patient expects to be discharged to:: Other (Comment) ([facility] apartment)                   Additional Comments: Pt reports lives in [place] in [place], lives in the main building, enjoys walking around inside the building and walking at [employer] for exercise. pt reports has RW and rollator walker but doesn't use them.    Prior Function Prior Level of Function : Independent/Modified Independent;Driving             Mobility Comments: Pt reports ind without AD for household and community distances, has walking/hiking stick for walking in the park ADLs Comments: Pt reports ind with self care, makes simple meals in apartment, partakes in meals at [employer], has weekly cleaning service     Extremity/Trunk Assessment   Upper Extremity Assessment Upper Extremity Assessment: Overall WFL for tasks assessed    Lower Extremity Assessment Lower Extremity Assessment: Overall WFL for tasks assessed;RLE deficits/detail;LLE deficits/detail RLE Deficits / Details: AROM WFL, knee extension 5/5, knee flexion 4+/5, hip flexion 4+/5, hip abduction 4+/5, ankle 5/5 RLE Sensation: WNL RLE Coordination: WNL LLE Deficits / Details: AROM WFL, knee extension 5/5, knee flexion 4+/5, hip flexion 4+/5, hip abduction 4+/5, ankle 5/5 LLE Sensation: WNL LLE Coordination: WNL    Cervical / Trunk Assessment Cervical / Trunk Assessment: Normal  Communication   Communication Communication: No apparent difficulties    Cognition Arousal: Alert Behavior During Therapy: WFL for tasks assessed/performed, Anxious   PT - Cognitive impairments: No apparent impairments                       PT - Cognition Comments: pt reports having panic attack while ambulating, responds well to cues for relaxation and slow, deep  breaths Following commands: Intact       Cueing       General Comments General comments (skin integrity, edema, etc.): on RA SpO2 87%, on 2L SpO2 96% with ambulation    Exercises     Assessment/Plan    PT Assessment Patient needs continued PT services  PT Problem List Decreased strength;Decreased activity tolerance;Decreased balance;Decreased mobility;Decreased knowledge of use of DME;Decreased knowledge of precautions;Cardiopulmonary status limiting activity       PT Treatment Interventions DME instruction;Gait training;Functional mobility training;Therapeutic activities;Therapeutic exercise;Balance training;Patient/family education    PT Goals (Current goals can be found in the Care Plan section)  Acute Rehab PT Goals Patient Stated Goal: feel better, return to [facility] [place] PT Goal Formulation: With patient Time For Goal Achievement: 06/27/24 Potential to Achieve Goals: Good    Frequency Min 3X/week     Co-evaluation               AM-PAC PT 6 Clicks Mobility  Outcome Measure Help needed turning from your back to your side while in a flat bed without using bedrails?: A Little Help needed moving from lying on your back  to sitting on the side of a flat bed without using bedrails?: A Little Help needed moving to and from a bed to a chair (including a wheelchair)?: A Little Help needed standing up from a chair using your arms (e.g., wheelchair or bedside chair)?: A Little Help needed to walk in hospital room?: A Little Help needed climbing 3-5 steps with a railing? : A Lot 6 Click Score: 17    End of Session Equipment Utilized During Treatment: Gait belt;Oxygen  Activity Tolerance: Patient tolerated treatment well Patient left: in chair;with call bell/phone within reach;with nursing/sitter in room Nurse Communication: Mobility status PT Visit Diagnosis: Unsteadiness on feet (R26.81);Other abnormalities of gait and mobility (R26.89)    Time: 8897-8871 PT Time  Calculation (min) (ACUTE ONLY): 26 min   Charges:   PT Evaluation $PT Eval Low Complexity: 1 Low PT Treatments $Gait Training: 8-22 mins PT General Charges $$ ACUTE PT VISIT: 1 Visit         Tori Bhakti Labella PT, DPT 06/13/24, 11:43 AM

## 2024-06-13 NOTE — Progress Notes (Signed)
 " Progress Note   Patient: [name] FMW:[id number] DOB: July 02, 1940 DOA: 06/11/2024     1 DOS: the patient was seen and examined on 06/13/2024   Brief hospital course: [name] is an 84 year old female with history of hypertension, hyperlipidemia, neuropathy, non-insulin -dependent diabetes mellitus type 2 on metformin , history of PE, aortic atherosclerosis, mild intermittent asthma presented to the ED for chief concerns of shortness of breath cough and wheezing since Sunday evening. She was at [facility] ED where she was noted to be febrile Tmax 101.2, COVID/ flu panel negative. Chest xray showed multilobar pneumonia admitted to [facility] service for further management.  06/12/2024: I assumed care of patient.   Assessment and Plan: Acute hypoxic respiratory failure * Multilobar lung infiltrate In the left upper lobe and superior segment of the left lower lobe. Continue ceftriaxone 2 g IV daily, azithromycin 500 mg PO daily to complete a 5-day course. Encourage out of bed to chair, flutter valve. Wean supplemental O2.  Asthma, mild intermittent with acute exacerbation Continue Home Breo Ellipta inhaler daily. She got solumedrol in ED. Will avoid continuing steroids due to active infection. DuoNebs 4 times daily, 4 doses scheduled Albuterol nebulizer every 4 hours as needed for wheezing and shortness of breath, 5 days ordered. Out of bed, PT/ OT.  Type 2 diabetes mellitus with hyperglycemia, without long-term current use of insulin (HCC) Home metformin on hold for now. Insulin SSI with at bedtime coverage ordered Goal inpatient blood glucose levels 140-180. Resumed home pregabalin.  Hyperlipidemia associated with type 2 diabetes mellitus (HCC) Home ezetimibe 10 mg daily, fenofibrate 160 mg daily resumed  Essential hypertension Hydralazine PRN. She is not on any home meds.    Out of bed to chair. Incentive spirometry. Nursing supportive care. Fall, aspiration  precautions. Diet:  Diet Orders (From admission, onward)     Start     Ordered   06/12/24 1019  Diet Heart Room service appropriate? Yes; Fluid consistency: Thin  Diet effective now       Question Answer Comment  Room service appropriate? Yes   Fluid consistency: Thin      01 /14/26 1021           DVT prophylaxis: heparin  injection 5,000 Units Start: 06/12/24 1400 Place TED hose Start: 06/12/24 1019  Level of care: Telemetry   Code Status: Limited: Do not attempt resuscitation (DNR) -DNR-LIMITED -Do Not Intubate/DNI   Subjective: Patient is seen and examined today morning. She is sitting in chair, seems anxious. Did work with PT, was hypoxic requiring 2L supplemental O2.  Physical Exam: Vitals:   06/13/24 0417 06/13/24 0727 06/13/24 0850 06/13/24 0904  BP: (!) 150/71     Pulse: 72     Resp:      Temp: 98.1 F (36.7 C)     TempSrc: Oral     SpO2:  93% 90% 92%  Weight:      Height:        General - Elderly Caucasian female, moderate respiratory distress HEENT - PERRLA, EOMI, atraumatic head, non tender sinuses. Lung - Clear, basal rales, no rhonchi, has diffuse wheezes. tachypnea Heart - S1, S2 heard, no murmurs, rubs, trace pedal edema. Abdomen - Soft, non tender, bowel sounds good Neuro - Alert, awake and oriented x 3, non focal exam. Skin - Warm and dry.  Data Reviewed:      Latest Ref Rng & Units 06/13/2024    3:59 AM 06/11/2024    1:28 PM 03/04/2024   11:33  AM  CBC  WBC 4.0 - 10.5 K/uL 16.0  10.3  5.4   Hemoglobin 12.0 - 15.0 g/dL 88.8  88.4  88.5   Hematocrit 36.0 - 46.0 % 33.8  34.9  33.6   Platelets 150 - 400 K/uL 232  189  219.0       Latest Ref Rng & Units 06/13/2024    3:59 AM 06/11/2024    1:28 PM 03/04/2024   11:33 AM  BMP  Glucose 70 - 99 mg/dL 791  771  816   BUN 8 - 23 mg/dL 18  14  19    Creatinine 0.44 - 1.00 mg/dL 9.05  8.86  9.03   Sodium 135 - 145 mmol/L 138  135  140   Potassium 3.5 - 5.1 mmol/L 4.4  4.3  4.3   Chloride 98 - 111  mmol/L 109  101  105   CO2 22 - 32 mmol/L 19  23  25    Calcium 8.9 - 10.3 mg/dL 9.4  9.9  9.5    CT Angio Chest PE W and/or Wo Contrast Result Date: 06/11/2024 CLINICAL DATA:  Cough, congestion wheezing EXAM: CT ANGIOGRAPHY CHEST WITH CONTRAST TECHNIQUE: Multidetector CT imaging of the chest was performed using the standard protocol during bolus administration of intravenous contrast. Multiplanar CT image reconstructions and MIPs were obtained to evaluate the vascular anatomy. RADIATION DOSE REDUCTION: This exam was performed according to the departmental dose-optimization program which includes automated exposure control, adjustment of the mA and/or kV according to patient size and/or use of iterative reconstruction technique. CONTRAST:  75mL OMNIPAQUE  IOHEXOL  350 MG/ML SOLN COMPARISON:  05/11/2021 CTA chest 06/11/2024 chest x-ray FINDINGS: Cardiovascular: Intact thoracic aorta without aneurysm or dissection. No acute aortic process, mediastinal hemorrhage or hematoma. Patent 3 vessel arch anatomy. Aortic atherosclerosis present. Central pulmonary arteries are normal in caliber and patent. No significant filling defect or pulmonary embolus by CTA. Normal heart size. No pericardial effusion. Central venous structures are patent. No veno-occlusive process. Mediastinum/Nodes: Thyroid  unremarkable. Trachea and central airways are patent. Esophagus nondilated. Small hiatal hernia noted. Mildly prominent scattered mediastinal, hilar and subcarinal lymph nodes, short axis measurements all 9 mm or less favored to be reactive. No abnormal bulky adenopathy. Lungs/Pleura: Densely consolidative left upper lobe airspace opacity posteriorly abutting the fissure with central air bronchograms, consistent with consolidative pneumonia. This correlates with the chest x-ray finding. Additional areas of bronchovascular patchy airspace opacity in the superior segment of the left lower lobe also compatible with pneumonia. Minor  dependent basilar atelectasis in both lower lobes. No pleural abnormality, effusion, or pneumothorax. Upper Abdomen: Tiny layering gallstones noted, some are calcified. No large focal hepatic abnormality. Abdominal aorta atherosclerotic. No acute upper abdominal finding. Musculoskeletal: Degenerative changes noted throughout the spine and shoulders. Review of the MIP images confirms the above findings. IMPRESSION: 1. Negative for significant acute pulmonary embolus by CTA. 2. Densely consolidative left upper lobe pneumonia and additional patchy pneumonia in the superior segment of the left lower lobe. 3. Recommend follow-up after medical therapy to document resolution. 4. Aortic atherosclerosis. Aortic Atherosclerosis (ICD10-I70.0). Electronically Signed   By: CHRISTELLA.  Shick M.D.   On: 06/11/2024 15:41   DG Chest 2 View Result Date: 06/11/2024 EXAM: 2 VIEW(S) XRAY OF THE CHEST 06/11/2024 11:50:00 AM COMPARISON: 11/27/2023 CLINICAL HISTORY: The patient presents with shortness of breath. FINDINGS: LUNGS AND PLEURA: New left upper lobe mass-like opacity with possible internal cavitation measuring approximately 7.1 x 4.4 cm. No pleural effusion. No pneumothorax. HEART AND MEDIASTINUM:  No acute abnormality of the cardiac and mediastinal silhouettes. BONES AND SOFT TISSUES: Surgical hardware in lower cervical and lumbar spine partially included. No acute osseous abnormality. IMPRESSION: 1. New left upper lobe mass-like opacity with possible internal cavitation measuring approximately 7.1 x 4.4 cm, which may represent primary lung malignancy or cavitary infection (including cavitary pneumonia or tuberculosis), with inflammatory etiologies less likely. 2. Recommend contrast-enhanced CT chest for further characterization and to guide management; if infection is suspected and treated, repeat chest radiograph in 6-8 weeks to document resolution, and if persistent, consider PET/CT and tissue sampling. Electronically signed by:  Waddell Calk MD MD 06/11/2024 12:15 PM EST RP Workstation: [id number]    Family Communication: Discussed with patient, she understand and agree. All questions answered.  Disposition: Status is: Inpatient Remains inpatient appropriate because: hypoxic, IV antibiotics for pna, weak.  Planned Discharge Destination: Home with Home Health     Time spent: 51 minutes  Author: Concepcion Riser, MD 06/13/2024 11:47 AM Secure chat 7am to 7pm For on call review [email or web address].    "

## 2024-06-14 DIAGNOSIS — F419 Anxiety disorder, unspecified: Secondary | ICD-10-CM | POA: Insufficient documentation

## 2024-06-14 DIAGNOSIS — J45901 Unspecified asthma with (acute) exacerbation: Secondary | ICD-10-CM

## 2024-06-14 DIAGNOSIS — R918 Other nonspecific abnormal finding of lung field: Secondary | ICD-10-CM | POA: Diagnosis not present

## 2024-06-14 DIAGNOSIS — J9601 Acute respiratory failure with hypoxia: Secondary | ICD-10-CM

## 2024-06-14 DIAGNOSIS — I10 Essential (primary) hypertension: Secondary | ICD-10-CM | POA: Diagnosis not present

## 2024-06-14 DIAGNOSIS — E1169 Type 2 diabetes mellitus with other specified complication: Secondary | ICD-10-CM

## 2024-06-14 LAB — GLUCOSE, CAPILLARY
Glucose-Capillary: 155 mg/dL — ABNORMAL HIGH (ref 70–99)
Glucose-Capillary: 154 mg/dL — ABNORMAL HIGH (ref 70–99)

## 2024-06-14 LAB — CBC
HCT: 35.7 % — ABNORMAL LOW (ref 36.0–46.0)
Hemoglobin: 11.8 g/dL — ABNORMAL LOW (ref 12.0–15.0)
MCH: 28.4 pg (ref 26.0–34.0)
MCHC: 33.1 g/dL (ref 30.0–36.0)
MCV: 85.8 fL (ref 80.0–100.0)
Platelets: 252 K/uL (ref 150–400)
RBC: 4.16 MIL/uL (ref 3.87–5.11)
RDW: 14.8 % (ref 11.5–15.5)
WBC: 15.9 K/uL — ABNORMAL HIGH (ref 4.0–10.5)
nRBC: 0 % (ref 0.0–0.2)

## 2024-06-14 MED ORDER — PREDNISONE 50 MG PO TABS
50.0000 mg | ORAL_TABLET | Freq: Every day | ORAL | Status: DC
  Administered 2024-06-15 – 2024-06-16 (×2): 50 mg via ORAL
  Filled 2024-06-14 (×3): qty 1

## 2024-06-14 MED ORDER — GUAIFENESIN-DM 100-10 MG/5ML PO SYRP
5.0000 mL | ORAL_SOLUTION | ORAL | Status: DC | PRN
  Filled 2024-06-14: qty 5

## 2024-06-14 MED ORDER — BENZONATATE 100 MG PO CAPS
200.0000 mg | ORAL_CAPSULE | Freq: Three times a day (TID) | ORAL | Status: DC | PRN
  Administered 2024-06-15: 200 mg via ORAL
  Filled 2024-06-14 (×5): qty 2

## 2024-06-14 MED ORDER — TRAMADOL HCL 50 MG PO TABS: 25.0000 mg | ORAL_TABLET | Freq: Four times a day (QID) | ORAL | 0 refills | Status: AC | PRN

## 2024-06-14 MED ORDER — AMLODIPINE BESYLATE 2.5 MG PO TABS
2.5000 mg | ORAL_TABLET | Freq: Every day | ORAL | Status: DC
  Administered 2024-06-14 – 2024-06-16 (×3): 2.5 mg via ORAL
  Filled 2024-06-14 (×2): qty 0.5
  Filled 2024-06-14 (×3): qty 1

## 2024-06-14 MED ORDER — METHYLPREDNISOLONE SODIUM SUCC 125 MG IJ SOLR: 80.0000 mg | Freq: Every day | INTRAMUSCULAR | Status: DC

## 2024-06-14 MED ORDER — METHYLPREDNISOLONE SODIUM SUCC 125 MG IJ SOLR
80.0000 mg | Freq: Once | INTRAMUSCULAR | Status: AC
  Administered 2024-06-14: 80 mg via INTRAVENOUS
  Filled 2024-06-14: qty 1.28
  Filled 2024-06-14: qty 2

## 2024-06-14 MED ORDER — HYDROCOD POLI-CHLORPHE POLI ER 10-8 MG/5ML PO SUER: 5.0000 mL | Freq: Two times a day (BID) | ORAL | 0 refills | Status: AC | PRN

## 2024-06-14 MED ORDER — TRAMADOL HCL 50 MG PO TABS
25.0000 mg | ORAL_TABLET | Freq: Four times a day (QID) | ORAL | Status: AC | PRN
  Filled 2024-06-14: qty 1

## 2024-06-14 MED ORDER — PREGABALIN 100 MG PO CAPS: 100.0000 mg | ORAL_CAPSULE | Freq: Three times a day (TID) | ORAL | 1 refills | Status: AC

## 2024-06-14 MED ORDER — CLONAZEPAM 0.25 MG PO TBDP: 0.2500 mg | ORAL_TABLET | Freq: Two times a day (BID) | ORAL | 0 refills | Status: AC | PRN

## 2024-06-14 MED ORDER — IPRATROPIUM-ALBUTEROL 0.5-2.5 (3) MG/3ML IN SOLN
3.0000 mL | Freq: Four times a day (QID) | RESPIRATORY_TRACT | Status: DC
  Administered 2024-06-14 (×2): 3 mL via RESPIRATORY_TRACT
  Filled 2024-06-14 (×8): qty 3

## 2024-06-14 MED ORDER — CLONAZEPAM 0.25 MG PO TBDP
0.2500 mg | ORAL_TABLET | Freq: Two times a day (BID) | ORAL | Status: DC | PRN
  Filled 2024-06-14: qty 1

## 2024-06-14 NOTE — Progress Notes (Signed)
 Physical Therapy Treatment Patient Details Name: [name] MRN: [id number] DOB: July 30, 1940 Today's Date: 06/14/2024   History of Present Illness [name] is an 84 y.o. female admitted with multilobar lung infiltrate. CT angio chest: 1. Negative for significant acute pulmonary embolus by CTA. 2. Densely consolidative left upper lobe pneumonia and additional patchy pneumonia in the superior segment of the left lower lobe.   PMH : DM2, HTN, HLD, CKD, hx of PE, DVT    PT Comments  Pt verbalizes anxiousness regarding going home and new home O2 use, offered education and relaxation cues as needed. Pt needing safety cues with O2 tubing, noted to step on tubing and tangle feet in it in standing, therapist constantly cuing pt for tubing awareness and therapist managing tank with ambulation. Pt amb ~20 ft increments up to 100 ft with slow cautious-like step progression, narrow BOS, pt looking at therapist when stopping to ask how to breath, needing cues for relaxation and slow pursed lip breathing, needing min A to CGA for steadying support without RW. Pt amb on RA with SpO2 88%, on 2L SpO2 90% and on 3L SpO2 91% - notified RN. Pt on 3L O2 upon therapist's arrival with SpO2 96% and SpO2 95% after ~2 min seated rest after ambulation.   If plan is discharge home, recommend the following: A little help with walking and/or transfers;A little help with bathing/dressing/bathroom;Assistance with cooking/housework;Assist for transportation   Can travel by private vehicle        Equipment Recommendations  None recommended by PT    Recommendations for Other Services       Precautions / Restrictions Precautions Precautions: Fall Precaution/Restrictions Comments: monitor O2 Restrictions Weight Bearing Restrictions Per Provider Order: No     Mobility  Bed Mobility               General bed mobility comments: pt in recliner upon arrival    Transfers Overall transfer level: Needs  assistance Equipment used: None Transfers: Sit to/from Stand Sit to Stand: Supervision           General transfer comment: supv, therapist managing O2 tubing as pt stepping on it and needing safety cues    Ambulation/Gait Ambulation/Gait assistance: Min assist, Contact guard assist Gait Distance (Feet): 100 Feet Assistive device: None, 1 person hand held assist Gait Pattern/deviations: Decreased step length - left, Decreased stance time - right, Decreased stride length, Narrow base of support Gait velocity: decreased     General Gait Details: slow cautious-like step progression, short steps with low bil foot clearance, pt amb ~20 ft increments then stops and looks at therapist, therapist providing pursed lip breathing cues continuously throughout amb, therapist managing tank as pt turning without regard to cord and stepping on it despite education, pt appears restless and needing relaxation cues throughout session; on RA SpO2 88%, on 2L SpO2 90% and on 3L SpO2 91% - notified RN   Stairs             Wheelchair Mobility     Tilt Bed    Modified Rankin (Stroke Patients Only)       Balance Overall balance assessment: Needs assistance Sitting-balance support: Feet supported Sitting balance-Leahy Scale: Fair Sitting balance - Comments: requesting therapist to don shoes this session   Standing balance support: During functional activity, Single extremity supported, No upper extremity supported Standing balance-Leahy Scale: Fair Standing balance comment: intermittent single UE support  Communication Communication Communication: No apparent difficulties  Cognition Arousal: Alert Behavior During Therapy: Anxious, Restless   PT - Cognitive impairments: No apparent impairments                       PT - Cognition Comments: pt reports anxiousness regarding going home, new O2 use, needing relaxation cued and education to  improve mood Following commands: Intact      Cueing Cueing Techniques: Verbal cues, Visual cues  Exercises      General Comments General comments (skin integrity, edema, etc.): P ton 3L O2 upon therapist's arrival with SpO2 96% at rest; pt amb on RA with SpO2 88%, amb on 2L with SpO2 90% and amb on 3L with SpO2 91%. Constant relaxation and pursed lip breathing cues with demonstration. SpO2 improves to 95% once seated in recliner after ~2 minutes of seated pursed lip breathing.      Pertinent Vitals/Pain Pain Assessment Pain Assessment: No/denies pain    Home Living                          Prior Function            PT Goals (current goals can now be found in the care plan section) Acute Rehab PT Goals Patient Stated Goal: feel better, return to [facility] [place] PT Goal Formulation: With patient Time For Goal Achievement: 06/27/24 Potential to Achieve Goals: Good Progress towards PT goals: Progressing toward goals    Frequency    Min 3X/week      PT Plan      Co-evaluation              AM-PAC PT 6 Clicks Mobility   Outcome Measure  Help needed turning from your back to your side while in a flat bed without using bedrails?: A Little Help needed moving from lying on your back to sitting on the side of a flat bed without using bedrails?: A Little Help needed moving to and from a bed to a chair (including a wheelchair)?: A Little Help needed standing up from a chair using your arms (e.g., wheelchair or bedside chair)?: A Little Help needed to walk in hospital room?: A Little Help needed climbing 3-5 steps with a railing? : A Lot 6 Click Score: 17    End of Session Equipment Utilized During Treatment: Gait belt;Oxygen  Activity Tolerance: Patient tolerated treatment well;Patient limited by fatigue Patient left: in chair;with call bell/phone within reach;with chair alarm set Nurse Communication: Mobility status;Other (comment) (SpO2 and O2) PT Visit  Diagnosis: Unsteadiness on feet (R26.81);Other abnormalities of gait and mobility (R26.89)     Time: 8982-8951 PT Time Calculation (min) (ACUTE ONLY): 31 min  Charges:    $Gait Training: 8-22 mins $Therapeutic Activity: 8-22 mins PT General Charges $$ ACUTE PT VISIT: 1 Visit                     Tori Laurette Villescas PT, DPT 06/14/24, 11:34 AM
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
## 2024-06-14 NOTE — Progress Notes (Signed)
 Admitted patient to hospital at home program. Discussed Hospital at home policies regarding home medications, taking only hospital dispensed medications, how to call nurse,paramedic /EMT visit bid and camera visit with MD and RN daily and prn,the need to call RN for supervision on any medications taken outside window of paramedic visit,current health equipment.All questions answered.  Plan of care and education initiated.

## 2024-06-14 NOTE — Progress Notes (Addendum)
 " Hospital at Home Interim Note     [name]   FMW:[id number]  DOB: 03/09/1941  DOA: 06/11/2024     2 Date of Service: 06/14/2024   Brief hospital course: [name] is an 84 year old female with history of hypertension, hyperlipidemia, neuropathy, non-insulin -dependent diabetes mellitus type 2 on metformin , history of PE, aortic atherosclerosis, mild intermittent asthma presented to the ED for chief concerns of shortness of breath cough and wheezing since Sunday evening. She was at [facility] ED where she was noted to be febrile Tmax 101.2, COVID/ flu panel negative. Chest xray showed multilobar pneumonia admitted to [facility] 2 service for further management.  I had a lengthy discussion with patient as well as the daughter about the hospital at home program. Both daughter and patient are in agreement. Management at [facility]) which is agreement with our program. Daughter will be staying with the patient while on program.   Subjective:  Mild improvement in SOB. Still with DOE, wheezing and cough.   Hospital Problems Assessment and Plan: * Multilobar lung infiltrate Acute respiratory failure with hypoxia  Decompensated resp status in setting of multilobar PNA and asthma exacerbation  Currently on 2-3L [place] with O2 sats in mid 90s  Admission with left upper lobe and superior segment of the left lower lobe Continue with ceftriaxone  2 g IV daily, azithromycin  500 mg PO daily to complete a 5-day course IS, flutter valve Continue supplemental O2  Asthma exacerbation treatment as noted above   Asthma, mild intermittent with acute exacerbation Home Breo Ellipta  inhaler daily resumed Cont IV solumedrol 80mg  x1 for-->transition to oral prednisone  in am  DuoNebs 4 times daily scheduled  Albuterol  nebulizer every 4 hours as needed for wheezing and shortness of breath, 5 days ordered Incentive spirometry, flutter valve  Type 2 diabetes mellitus with hyperglycemia, without  long-term current use of insulin  (HCC) Home metformin  will not be resumed on admission Insulin  SSI with at bedtime coverage ordered Goal inpatient blood glucose levels 140-180  Hyperlipidemia associated with type 2 diabetes mellitus (HCC) Home ezetimibe  10 mg daily, fenofibrate  160 mg daily resumed  Essential hypertension SBP in 160s  Will add on low dose norvasc  and titrate    Anxiety and depression Appears to be baseline anxiety with acute decompensation in setting patient's twin sibling recently passing away in addition to acute resp failure/asthma with steroid use  Will add on prn klonopin  for anxiety  Monitor closely   Type 2 diabetes mellitus with diabetic neuropathy (HCC) Blood sugar in 150s  Will monitor w/ steroid use  BID blood sugars on [facility] program  Consider low dose lantus vs. Glipizide for acute treatment as appropriate          Objective Vital signs were reviewed and unremarkable. Vitals:   06/14/24 0046 06/14/24 0611 06/14/24 1008 06/14/24 1400  BP: (!) 167/60 (!) 181/76  (!) 148/60  Pulse:  91  100  Temp:  98.3 F (36.8 C)    Resp:  18    Height:      Weight:      SpO2:  100% 97% 93%  TempSrc:  Oral    BMI (Calculated):        Exam Physical Exam HENT:     Head: Normocephalic and atraumatic.     Nose: Nose normal.     Mouth/Throat:     Mouth: Mucous membranes are moist.  Eyes:     Pupils: Pupils are equal, round, and reactive to light.  Cardiovascular:  Rate and Rhythm: Normal rate and regular rhythm.  Pulmonary:     Effort: Pulmonary effort is normal.     Breath sounds: Wheezing present.  Abdominal:     General: Bowel sounds are normal.  Musculoskeletal:        General: Normal range of motion.     Cervical back: Normal range of motion.  Skin:    General: Skin is warm.  Neurological:     General: No focal deficit present.  Psychiatric:        Mood and Affect: Mood normal.      Labs / Other Information There are no new  results to review at this time. CT Angio Chest PE W and/or Wo Contrast CLINICAL DATA:  Cough, congestion wheezing  EXAM: CT ANGIOGRAPHY CHEST WITH CONTRAST  TECHNIQUE: Multidetector CT imaging of the chest was performed using the standard protocol during bolus administration of intravenous contrast. Multiplanar CT image reconstructions and MIPs were obtained to evaluate the vascular anatomy.  RADIATION DOSE REDUCTION: This exam was performed according to the departmental dose-optimization program which includes automated exposure control, adjustment of the mA and/or kV according to patient size and/or use of iterative reconstruction technique.  CONTRAST:  75mL OMNIPAQUE  IOHEXOL  350 MG/ML SOLN  COMPARISON:  05/11/2021 CTA chest 06/11/2024 chest x-ray  FINDINGS: Cardiovascular: Intact thoracic aorta without aneurysm or dissection. No acute aortic process, mediastinal hemorrhage or hematoma. Patent 3 vessel arch anatomy. Aortic atherosclerosis present. Central pulmonary arteries are normal in caliber and patent. No significant filling defect or pulmonary embolus by CTA. Normal heart size. No pericardial effusion. Central venous structures are patent. No veno-occlusive process.  Mediastinum/Nodes: Thyroid  unremarkable. Trachea and central airways are patent. Esophagus nondilated. Small hiatal hernia noted. Mildly prominent scattered mediastinal, hilar and subcarinal lymph nodes, short axis measurements all 9 mm or less favored to be reactive. No abnormal bulky adenopathy.  Lungs/Pleura: Densely consolidative left upper lobe airspace opacity posteriorly abutting the fissure with central air bronchograms, consistent with consolidative pneumonia. This correlates with the chest x-ray finding. Additional areas of bronchovascular patchy airspace opacity in the superior segment of the left lower lobe also compatible with pneumonia. Minor dependent basilar atelectasis in both lower  lobes. No pleural abnormality, effusion, or pneumothorax.  Upper Abdomen: Tiny layering gallstones noted, some are calcified. No large focal hepatic abnormality. Abdominal aorta atherosclerotic. No acute upper abdominal finding.  Musculoskeletal: Degenerative changes noted throughout the spine and shoulders.  Review of the MIP images confirms the above findings.  IMPRESSION: 1. Negative for significant acute pulmonary embolus by CTA. 2. Densely consolidative left upper lobe pneumonia and additional patchy pneumonia in the superior segment of the left lower lobe. 3. Recommend follow-up after medical therapy to document resolution. 4. Aortic atherosclerosis.  Aortic Atherosclerosis (ICD10-I70.0).  Electronically Signed   By: CHRISTELLA.  Shick M.D.   On: 06/11/2024 15:41 DG Chest 2 View EXAM: 2 VIEW(S) XRAY OF THE CHEST 06/11/2024 11:50:00 AM  COMPARISON: 11/27/2023  CLINICAL HISTORY: The patient presents with shortness of breath.  FINDINGS:  LUNGS AND PLEURA: New left upper lobe mass-like opacity with possible internal cavitation measuring approximately 7.1 x 4.4 cm. No pleural effusion. No pneumothorax.  HEART AND MEDIASTINUM: No acute abnormality of the cardiac and mediastinal silhouettes.  BONES AND SOFT TISSUES: Surgical hardware in lower cervical and lumbar spine partially included. No acute osseous abnormality.  IMPRESSION: 1. New left upper lobe mass-like opacity with possible internal cavitation measuring approximately 7.1 x 4.4 cm, which may represent primary lung  malignancy or cavitary infection (including cavitary pneumonia or tuberculosis), with inflammatory etiologies less likely. 2. Recommend contrast-enhanced CT chest for further characterization and to guide management; if infection is suspected and treated, repeat chest radiograph in 6-8 weeks to document resolution, and if persistent, consider PET/CT and tissue sampling.  Electronically signed by:  Waddell Calk MD MD 06/11/2024 12:15 PM EST RP Workstation: [id number]  Lab Results  Component Value Date   WBC 15.9 (H) 06/14/2024   HGB 11.8 (L) 06/14/2024   HCT 35.7 (L) 06/14/2024   MCV 85.8 06/14/2024   PLT 252 06/14/2024   Last metabolic panel Lab Results  Component Value Date   GLUCOSE 208 (H) 06/13/2024   NA 138 06/13/2024   K 4.4 06/13/2024   CL 109 06/13/2024   CO2 19 (L) 06/13/2024   BUN 18 06/13/2024   CREATININE 0.94 06/13/2024   GFRNONAA 60 (L) 06/13/2024   CALCIUM 9.4 06/13/2024   PROT 6.7 06/11/2024   ALBUMIN 3.8 06/11/2024   BILITOT 0.4 06/11/2024   ALKPHOS 70 06/11/2024   AST 60 (H) 06/11/2024   ALT 49 (H) 06/11/2024   ANIONGAP 11 06/13/2024     Hospital at Home Admission Criteria Checklist:  Formal consent explained in detail and signed at the bedside: yes Patient meets inpatient admission criteria (see below for further details) yes Is pt Medicare FFS/[employer] Medicare-Medicaid, [employer], [employer] Medicare, [employer], [employer] ( required for initial launch with plan to expand)? yes Lives within 25 mil/ 30 min from [facility] within [place](pt may stay with family member during admission who lives within 25 miles or 30 min from [facility] w/in [facility])? yes Hemodynamically stable with relatively low risk of clinical deterioration-not requiring ICU? yes Age >18? yes Does not require frequent touch-points or complex interventions/medications (ie Titrated Infusions (IV insulin , heparin  drips, vasoactive drips, use of infused or injectable controlled substances, patients on insulin )? no Any Behavioral Health comorbidities likely to increase risk for in-home care (ie Acute delirium or experiencing a marked altered mental status and cause is not a treatable condition in the home)? no Has the patient been on BIPAP during course of ED evaluation or hospitalization? no IF YES, Has the patient been off of BIPAP for >24 hours(If NO-THEN PATIENT DOES  MEET INCLUSION CRITERIA)? no On Room Air or Needs oxygen  at home (<6L)? is on oxygen  at 2-3 L/min per nasal cannula. Active safety concerns (ie Unable to use bedside commode independently and lacks caregiver support for safety- needs SNF placement, unable to obtain IV access)? no Has skin check been performed? no - pending  Has Physical Therapy screened the patient? yes  Common admission diagnoses including: CAP, COPD Exacerbation, Acute on chronic heart failure, Cellulitis, UTI , dehydration, acute resp failure with hypoxia (requiring <5L)   Social Screening:  - Has the family been directly contacted about Hospital at Home program with consent obtained (if yes- please document who was spoken to with name and phone number)? yes  -Was the family approached about the use of TOC pharmacy for medications at discharge? no Any significant ETOH intake? no Does patient smoke? no Does patient understand that they CANNOT smoke on the hospital at home program or in the presence of oxygen (fire hazard)? not applicable Patient states able to use iPad/phone for communication/has family who is able to use? yes Patient has agreed to be compliant with medication and treatment regimen of the program? yes Any active drug use in patient or primary caregiver including daily dosing of methadone?  no Stable home environment ( access to appropriate heating in cold conditions and/or appropriate air conditioning in hot conditions and/or no running water /electricity)? yes  No aggressive pets at home? no Firearm present? no  With ability or willingness to store them unloaded in a locked case for duration of hospitalization? no Ambulatory? yes  mild difficulty Bed bugs present on home evaluation? no Family support system in place? yes Patient feels safe at home? yes Any domestic violence reported? no Any actively decompensated behavioral health issues including agitation/aggressive behavior? no  Patient requests food to  be provided by hospital home program? no PT/OT eval completed and not requiring SNF, ALF, inpatient rehab? no  To be admitted to the Hospital at [facility] program, a patient generally must meet the following: 1. Requirement for Inpatient Level of Care: The patient's condition must necessitate an inpatient level of care. This is typically indicated by one or more of the following, depending on their specific diagnosis:  Persistent tachycardia despite appropriate treatment (e.g., for Heart Failure, UTI). Persistent tachypnea (rapid breathing) or dyspnea (shortness of breath) that hasn't improved sufficiently with observation care (e.g., for Heart Failure, Pneumonia, Viral Illness, COVID). Hypoxemia (low oxygen  levels), such as a new need for oxygen , an increased need from baseline, or specific oxygen  saturation levels (e.g., SpO2 <90-94% depending on the condition) that persist despite observation (e.g., for Heart Failure, COPD, Pneumonia, Viral Illness, COVID). Need for Intravenous (IV) hydration due to an inability to maintain oral hydration, which persists despite observation care (e.g., for Cellulitis, UTI, Viral Illness, COVID). Specific to Heart Failure: Persistent pulmonary edema, indicated by a new oxygen  need, lack of improvement with IV diuretics, and ongoing tachypnea/dyspnea. Specific to COPD: A decrease in known baseline resting oxygen  saturation (SpO2) by 4% or more, or an increase in pre-existing supplemental oxygen  requirements, which persists despite observation and requires continued close monitoring. Specific to Pneumonia: A Pneumonia Severity Index (PSI) class IV (moderate risk). Specific to Cellulitis: Failure of outpatient antibiotic therapy (indicated by progression or no improvement after a minimum of 48 hours on an adequate regimen) or a clinical presentation (like acuity or rapidity of progression) that requires the intensity of monitoring found in an inpatient setting. Specific to  UTI: Persistence or worsening of clinical findings like fever, pain, or dehydration despite observation care; presence of significant uropathy; suspected infection of an indwelling prosthetic device, stent, implant, or graft; or pregnancy with suspected pyelonephritis.  2. Appropriateness for Hospital at Home Setting: The patient's overall clinical picture, including the severity of their illness, their care needs, and their medical history and comorbidities, must be suitable for management in the Hospital at Home environment. This essentially means that none of the exclusion criteria (listed below) are met.  Unified Exclusion Criteria for Hospital at Home Admission: A patient would not be eligible for Hospital at Home if any of the following are present: Hemodynamic Instability: Hypotension (low blood pressure) is present. Respiratory Instability or Needs Beyond Program Capability: There is a new need for invasive or noninvasive ventilatory assistance (like BiPAP or a ventilator). Oxygenation is not sufficient, generally indicated if an FiO2 (fraction of inspired oxygen ) of 45% (which is about 6 Liters/minute via nasal cannula) or more is required to keep oxygen  saturation (SpO2) at 90% or greater. Monitoring or Procedural Needs Beyond Program Capability: There is a need for invasive monitoring, such as a pulmonary artery catheter or an arterial line. There is a need for immediate-response telemetry monitoring (for dangerous arrhythmia detection and subsequent immediate  intervention). The required medication regimen is beyond the capabilities of Hospital at Home (e.g., dosing intervals are too frequent for home administration). There is a need for a procedure that cannot be performed by the [facility] at [facility] team (e.g., significant wound debridement or abscess drainage for cellulitis, or percutaneous nephrostomy for a complicated UTI). Significant Organ Dysfunction or Markers of Severe  Illness: Mental status is not at baseline, or there is altered mental status suggestive of inadequate perfusion. Renal (kidney) function is unstable or showing an ongoing decline. There is evidence of inadequate perfusion, such as metabolic acidosis or myocardial ischemia. Uncompensated acidosis is present. Condition-Specific Severity or Complications Making Home Care Unsuitable: For Heart Failure: Known severe cardiac valvular disease (e.g., aortic stenosis, mitral regurgitation); or severe peripheral edema that impairs the ability to urinate or ambulate. For COPD: Known concurrent comorbidity or finding that indicates a higher-risk COPD exacerbation (e.g., pulmonary fibrosis, cavitation, pleural effusion, pneumothorax, rib fracture). For Pneumonia: Pneumonia Severity Index (PSI) class V (indicating high risk for inpatient mortality); known concurrent comorbidity or finding that indicates higher-risk pneumonia (e.g., pulmonary fibrosis, cavitation, large or loculated pleural effusion); or a concomitant serious infectious process like endocarditis or empyema. For Cellulitis: Orbital, periorbital, or necrotizing infection is suspected; or a concomitant serious infectious process like endocarditis, septic emboli, or septic joint space infection. For UTI: Urinary tract obstruction (e.g., kidney stone, bladder outlet obstruction); or a concomitant serious infectious process like endocarditis or septic emboli. For Viral Illness & COVID-19: A concomitant serious infectious process like endocarditis or empyema.  General Comorbidities or Status:  The patient is significantly immunosuppressed (this applies to Pneumonia, Cellulitis, UTI, Viral Illness, and COVID-19). The patient meets inpatient admission criteria for a second diagnosis, or has care needs beyond the capabilities of Hospital at Home due to an active clinically significant comorbidity. (This is a general exclusion across all listed  conditions)  Time spent: >60 minutes  [facility] 06/14/2024, 3:28 PM   "

## 2024-06-14 NOTE — Progress Notes (Signed)
"   Arrived to [facility] to admit patient. Went to nurses station at [facility] to get the shadow chart for the patient. Patient was A&O x4. Patient was given solumedrol prior to transport by the RN. Obtained a set of vitals in her room at [facility]. Pulse 100,92% room air,BP:148/60. Patient also got her Breo to take with her along with her flutter and spirometer. Loaded the patient with all safety belts and wheelchair straps. Upon arrival at home, I setup her O2 concentrator and the electronic equipment for monitoring.Explained the procedures for receiving tele-visits from the doctors and nurses......SABRAGarrel Keel EMT-B................ "

## 2024-06-14 NOTE — Progress Notes (Signed)
 Spoke with patient's daughter Luke to inform her we are currently en route to transfer her mother home.Luke is at the patient's residence. Also notified bedside nurse as well.

## 2024-06-14 NOTE — Progress Notes (Signed)
 H@H  medic out to patients home for admission process and visit. On arrival, I was met by EMT Garrel at the door. Pt was sitting in her recliner with her daughter sitting next to her. H@H  procedures for paramedic visits, medication adherence, and equipment usage was gone over with the patient and her daughter. Education was provided on how to use the nebulizer. Afternoon medications were completed. The patient was comfortable and happy to be home. She had no complaints at time of admission. Any and all questions were answered while in the home. A visit with the virtual nurse was completed. No other tasks were needed at this time.

## 2024-06-14 NOTE — Plan of Care (Signed)
  Problem: Education: Goal: Knowledge of General Education information will improve Description: Including pain rating scale, medication(s)/side effects and non-pharmacologic comfort measures Outcome: Progressing   Problem: Health Behavior/Discharge Planning: Goal: Ability to manage health-related needs will improve Outcome: Progressing   Problem: Clinical Measurements: Goal: Ability to maintain clinical measurements within normal limits will improve Outcome: Progressing Goal: Will remain free from infection Outcome: Progressing Goal: Diagnostic test results will improve Outcome: Progressing Goal: Respiratory complications will improve Outcome: Progressing Goal: Cardiovascular complication will be avoided Outcome: Progressing   Problem: Activity: Goal: Risk for activity intolerance will decrease Outcome: Progressing   Problem: Nutrition: Goal: Adequate nutrition will be maintained Outcome: Progressing   Problem: Coping: Goal: Level of anxiety will decrease Outcome: Progressing   Problem: Elimination: Goal: Will not experience complications related to bowel motility Outcome: Progressing Goal: Will not experience complications related to urinary retention Outcome: Progressing   Problem: Pain Managment: Goal: General experience of comfort will improve and/or be controlled Outcome: Progressing   Problem: Safety: Goal: Ability to remain free from injury will improve Outcome: Progressing   Problem: Skin Integrity: Goal: Risk for impaired skin integrity will decrease Outcome: Progressing   Problem: Education: Goal: Ability to describe self-care measures that may prevent or decrease complications (Diabetes Survival Skills Education) will improve Outcome: Progressing Goal: Individualized Educational Video(s) Outcome: Progressing   Problem: Coping: Goal: Ability to adjust to condition or change in health will improve Outcome: Progressing   Problem: Fluid  Volume: Goal: Ability to maintain a balanced intake and output will improve Outcome: Progressing   Problem: Health Behavior/Discharge Planning: Goal: Ability to identify and utilize available resources and services will improve Outcome: Progressing Goal: Ability to manage health-related needs will improve Outcome: Progressing   Problem: Nutritional: Goal: Maintenance of adequate nutrition will improve Outcome: Progressing Goal: Progress toward achieving an optimal weight will improve Outcome: Progressing   Problem: Skin Integrity: Goal: Risk for impaired skin integrity will decrease Outcome: Progressing   Problem: Tissue Perfusion: Goal: Adequacy of tissue perfusion will improve Outcome: Progressing

## 2024-06-14 NOTE — Assessment & Plan Note (Signed)
 Appears to be baseline anxiety with acute decompensation in setting patient's twin sibling recently passing away in addition to acute resp failure/asthma with steroid use  Will add on prn klonopin  for anxiety  Monitor closely

## 2024-06-15 LAB — BASIC METABOLIC PANEL WITH GFR
Anion gap: 13 (ref 5–15)
BUN: 24 mg/dL — ABNORMAL HIGH (ref 8–23)
CO2: 22 mmol/L (ref 22–32)
Calcium: 9.7 mg/dL (ref 8.9–10.3)
Chloride: 102 mmol/L (ref 98–111)
Creatinine, Ser: 0.92 mg/dL (ref 0.44–1.00)
GFR, Estimated: >60 mL/min
Glucose, Bld: 146 mg/dL — ABNORMAL HIGH (ref 70–99)
Potassium: 4.7 mmol/L (ref 3.5–5.1)
Sodium: 137 mmol/L (ref 135–145)

## 2024-06-15 LAB — COMPREHENSIVE METABOLIC PANEL WITH GFR
ALT: 32 U/L (ref 0–44)
AST: 31 U/L (ref 15–41)
Albumin: 3.6 g/dL (ref 3.5–5.0)
Alkaline Phosphatase: 70 U/L (ref 38–126)
Anion gap: 14 (ref 5–15)
BUN: 23 mg/dL (ref 8–23)
CO2: 21 mmol/L — ABNORMAL LOW (ref 22–32)
Calcium: 10 mg/dL (ref 8.9–10.3)
Chloride: 102 mmol/L (ref 98–111)
Creatinine, Ser: 0.9 mg/dL (ref 0.44–1.00)
GFR, Estimated: >60 mL/min
Glucose, Bld: 159 mg/dL — ABNORMAL HIGH (ref 70–99)
Potassium: 4.5 mmol/L (ref 3.5–5.1)
Sodium: 136 mmol/L (ref 135–145)
Total Bilirubin: 0.4 mg/dL (ref 0.0–1.2)
Total Protein: 6.9 g/dL (ref 6.5–8.1)

## 2024-06-15 LAB — CBC
HCT: 35.8 % — ABNORMAL LOW (ref 36.0–46.0)
HCT: 34 % — ABNORMAL LOW (ref 36.0–46.0)
Hemoglobin: 12.2 g/dL (ref 12.0–15.0)
Hemoglobin: 12.1 g/dL (ref 12.0–15.0)
MCH: 31 pg (ref 26.0–34.0)
MCH: 34.1 pg — ABNORMAL HIGH (ref 26.0–34.0)
MCHC: 34.1 g/dL (ref 30.0–36.0)
MCHC: 35.6 g/dL (ref 30.0–36.0)
MCV: 91.1 fL (ref 80.0–100.0)
MCV: 95.8 fL (ref 80.0–100.0)
Platelets: 212 K/uL (ref 150–400)
Platelets: 222 K/uL (ref 150–400)
RBC: 3.93 MIL/uL (ref 3.87–5.11)
RBC: 3.55 MIL/uL — ABNORMAL LOW (ref 3.87–5.11)
RDW: 15.1 % (ref 11.5–15.5)
RDW: 16.5 % — ABNORMAL HIGH (ref 11.5–15.5)
WBC: 11 K/uL — ABNORMAL HIGH (ref 4.0–10.5)
WBC: 11.7 K/uL — ABNORMAL HIGH (ref 4.0–10.5)
nRBC: 0 % (ref 0.0–0.2)
nRBC: 0 % (ref 0.0–0.2)

## 2024-06-15 MED ORDER — IPRATROPIUM-ALBUTEROL 0.5-2.5 (3) MG/3ML IN SOLN
3.0000 mL | Freq: Four times a day (QID) | RESPIRATORY_TRACT | Status: DC | PRN
  Administered 2024-06-15 – 2024-06-16 (×3): 3 mL via RESPIRATORY_TRACT
  Filled 2024-06-15 (×4): qty 3

## 2024-06-15 NOTE — Progress Notes (Signed)
 H@H  medic out to see patient for her second visit of the day. On arrival, I was greeted and welcomed in at the door by the patients daughter. VSS. The virtual nurse called in to say hi to the patient and to introduce herself. The patients oral night medications were given at this time including a nebulizer tx for wheezing. The patient had no complaints. She states that she's been using her flutter valve and used her incentive spirometer while I was there. No other tasks were needed at this time. The patient was told to call in to the nurse if she needed any assistance tonight. The patient was left in care of herself with her daughter by her side. H@H  visit completed.

## 2024-06-15 NOTE — Plan of Care (Signed)
  Problem: Education: Goal: Knowledge of General Education information will improve Description: Including pain rating scale, medication(s)/side effects and non-pharmacologic comfort measures Outcome: Progressing   Problem: Health Behavior/Discharge Planning: Goal: Ability to manage health-related needs will improve Outcome: Progressing   Problem: Clinical Measurements: Goal: Ability to maintain clinical measurements within normal limits will improve Outcome: Progressing Goal: Respiratory complications will improve Outcome: Progressing Goal: Cardiovascular complication will be avoided Outcome: Progressing   Problem: Nutrition: Goal: Adequate nutrition will be maintained Outcome: Progressing   Problem: Coping: Goal: Level of anxiety will decrease Outcome: Progressing   

## 2024-06-15 NOTE — Progress Notes (Signed)
 Completed virtual rounds with MD,paramedic at patient bedside. POC reviewed and discussed ,patient voices understanding and agreement. Pt reminded to call RN for any needs, RN and MD available at all times. Pt voices understanding. Pt aware of next planned visit and next call from RN.

## 2024-06-15 NOTE — Progress Notes (Signed)
 Video call completed with patient. Introduced myself as the CHARITY FUNDRAISER for the night and verbalized that I could be reached anytime overnight via phone or tablet call. Patient found sitting in chair while her IV antibiotic is infusing. Patient denies any pain or discomfort at this time and was able to take her bedtime medications with assist from the medic. All questions answered prior to ending video call.

## 2024-06-15 NOTE — TOC Progression Note (Addendum)
 Transition of Care [facility]) - Progression Note    Patient Details  Name: [name] MRN: [id number] Date of Birth: Jan 11, 1941  Transition of Care [facility]) CM/SW Contact  Corean JAYSON Canary, RN Phone Number: 06/15/2024, 9:22 AM  Clinical Narrative:    Patient will receive services from [facility] rehab for PT no fax number available, called [phone] and [phone]  and went on website to try to obtain fax. They are currently closed. Will call Monday for fax number to fax [facility] orders. 1600 Nebulizer ordered- will send out in HUB for delivery  Expected Discharge Plan: Home w Home Health Services Barriers to Discharge: Continued Medical Work up               Expected Discharge Plan and Services In-house Referral: Clinical Social Work   Post Acute Care Choice: Home Health Living arrangements for the past 2 months: Independent Living Facility                 DME Arranged: N/A DME Agency: NA                   Social Drivers of Health (SDOH) Interventions SDOH Screenings   Food Insecurity: No Food Insecurity (06/12/2024)  Housing: Low Risk (06/12/2024)  Transportation Needs: No Transportation Needs (06/12/2024)  Utilities: Not At Risk (06/12/2024)  Alcohol Screen: Low Risk (07/14/2022)  Depression (PHQ2-9): Low Risk (04/11/2024)  Financial Resource Strain: Low Risk (10/29/2021)  Physical Activity: Sufficiently Active (04/11/2024)  Social Connections: Moderately Integrated (06/12/2024)  Stress: No Stress Concern Present (04/11/2024)  Tobacco Use: Low Risk (06/12/2024)  Health Literacy: Adequate Health Literacy (04/11/2024)    Readmission Risk Interventions     No data to display

## 2024-06-15 NOTE — Progress Notes (Signed)
 Encounter done via synchronous video visit with patient. Vital signs obtained via remote patient monitoring equipment and additional assessment details provided by visiting Paramedic.   Patient identified using two identifiers and found in home in sitting position in recliner. Video and audio quality adequate for assessment.   Pt reports no acute distress, denies chest pain, dizziness, sob or new weakness.  Denies any urgent or non-urgent needs at this time either.   Patient Alert and Oriented x4, speaking in full sentences without distress, patient observed moving independently within camera view. No change in respiratory status with ambulation.    Patient and instructed to call this RN back for worsening symptoms or any other needs.  Verbalized understanding.   Medications administered per orders and documented per hospital at home standards of work for medication administration.    Assessment limited to patient-reported information, visual observation via video, data provided by RPM and visiting Paramedic.

## 2024-06-15 NOTE — Progress Notes (Addendum)
 " PROGRESS NOTE - Telemedicine  [name]  FMW:[id number] DOB: [birth year] DOA: 06/11/2024 PCP: [name] Cyndee [name], DO   [name] is an 84 year old female with history of hypertension, hyperlipidemia, neuropathy, non-insulin -dependent diabetes mellitus type 2 on metformin , history of PE, aortic atherosclerosis, mild intermittent asthma.  06/11/24: Patient presented to the ED for chief concerns of shortness of breath cough and wheezing since Sunday evening/Monday morning.  1/13: Patient admitted to [facility] for chief concerns of multilobar pneumonia, pending bed availability.  Vitals in the ED showed Tmax of 101.2, rr of 17, hr 86, blood pressure 146/110, SpO2 of 93% on room air.  Serum sodium is 135, potassium 4.3, chloride 101, bicarb 23, BUN of 14, serum creatinine 1.13, eGFR 48, nonfasting blood glucose 228, AST 60, ALT 49, alk phos is 70, hs troponin is <15 x 2.  Lactic acid was not elevated.  COVID/influenza A/influenza B/RSV PCR were negative.  ED treatment: Azithromycin  500 mg p.o. one-time dose, ceftriaxone  1 g IV one-time dose, DuoNeb treatment, Solu-Medrol  125 mg IV one-time dose, magnesium  2 g IV one-time dose.  06/12/2024: I assumed care of patient.   06/14/2024: Patient transferred to [facility] service. Day 4 of abx. Anticipate discharge 1/17 PM or 1/18 AM.   1/17: I assumed care of patient. Day 5 of abx. Home health PT ordered placed. DME for nebulizer placed  Assessment & Plan:   Principal Problem:   Multilobar lung infiltrate Active Problems:   Asthma, mild intermittent   Type 2 diabetes mellitus with hyperglycemia, without long-term current use of insulin  (HCC)   History of pulmonary embolus (PE)   Type 2 diabetes mellitus with diabetic neuropathy (HCC)   Essential hypertension   Hyperlipidemia associated with type 2 diabetes mellitus (HCC)   GERD (gastroesophageal reflux disease)   Acute hypoxic respiratory failure (HCC)   Anxiety and depression    Assessment and Plan:  * Multilobar lung infiltrate Acute respiratory failure with hypoxia  Decompensated resp status in setting of multilobar PNA and asthma exacerbation  Currently on 2-3L [place] with O2 sats in mid 90s  Admission with left upper lobe and superior segment of the left lower lobe Continue with ceftriaxone  2 g IV daily, azithromycin  500 mg PO daily to complete a 5-day course IS, flutter valve Continue supplemental O2  Asthma exacerbation treatment as noted above   Asthma, mild intermittent with acute exacerbation Home Breo Ellipta  inhaler daily resumed Cont IV solumedrol 80mg  x1 for-->transition to oral prednisone  in am  DuoNebs 4 times daily scheduled  Albuterol  nebulizer every 4 hours as needed for wheezing and shortness of breath, 5 days ordered Incentive spirometry, flutter valve  06/15/2024: Patient improving.  She is requesting DuoNebs be changed from scheduled to every 6 hours as needed for wheezing and shortness of breath.  DME nebulizer machine placed.  Type 2 diabetes mellitus with hyperglycemia, without long-term current use of insulin  (HCC) Home metformin  will not be resumed on admission Insulin  SSI with at bedtime coverage ordered Goal inpatient blood glucose levels 140-180  Hyperlipidemia associated with type 2 diabetes mellitus (HCC) Home ezetimibe  10 mg daily, fenofibrate  160 mg daily resumed  Essential hypertension SBP in 160s  Continue amlodipine  2.5 mg daily Can be increased to 5 mg as needed  Type 2 diabetes mellitus with diabetic neuropathy (HCC) Blood sugar in 150s  Will monitor w/ steroid use  BID blood sugars on HatH program  Consider low dose lantus vs. Glipizide for acute treatment as appropriate  CBG last 2 checks have been in the 150s Change CBG check to twice daily Goal inpatient blood glucose levels 140-180  Anxiety and depression Appears to be baseline anxiety with acute decompensation in setting patient's twin sibling recently  passing away in addition to acute resp failure/asthma with steroid use  Will add on prn klonopin  for anxiety  Monitor closely  DVT prophylaxis: ambulation as tolerated  Code Status: DNR/DNI Family Communication: daughter at bedside has been updated Disposition Plan: pending clinical course Level of care: Hospital at Home Med-Surg  Consultants:  Pt, ot, toc  Antimicrobials: 1/17: day 5 of abx, ceftriaxone  2 g IV daily, azithromycin  500 mg PO  Subjective:  At bedside via telemedicine encounter, patient was able to tell me her first and last name, and date of birth.  Patient reports that she slept much better last night.  She reports that she did have some shortness of breath this morning and has completed her morning breathing treatment.  She reports after the breathing treatment, she has had some coughing that is productive of yellow sputum.  She reports initially when she first started the breathing treatments on admission, her sputum was initially green and then became thick dark brown.  It has since transition to light and now light yellow.  She reports before she presented to the ED, she had cough but was not able to produce any sputum.  She reports she does not have a nebulizer machine, and would like to have a nebulizer machine so that she can have breathing treatments if she needs to in the future.  She reports she does have a history of asthma.  Objective: Vitals:   06/14/24 1651 06/14/24 2315 06/15/24 0300 06/15/24 1003  BP: (!) 166/66 (!) 182/70  (!) 140/68  Pulse: 96 87  71  Resp: 18 18  17   Temp: 98.1 F (36.7 C) 98.1 F (36.7 C)    TempSrc: Oral Oral    SpO2: 92% 97% 94% 93%  Weight:      Height:       No intake or output data in the 24 hours ending 06/15/24 1645 Filed Weights   06/12/24 1130  Weight: 70.3 kg   Examination was completed with the assistance of: [name], paramedic, who was present in the house during the virtual encounter:  General  exam: Appears calm and comfortable  Respiratory system: Clear to auscultation on the right side.  Left upper and lower lobe mild rhonchi.  Negative for wheezing.  Respiratory effort normal. Cardiovascular system: S1 & S2 heard, RRR. No murmurs. No pedal edema. Gastrointestinal system: Abdomen is nondistended, soft and nontender. No organomegaly or masses felt. Normal bowel sounds heard. Central nervous system: Alert and oriented. No focal neurological deficits. Extremities: Symmetric 5 x 5 power. Skin: No rashes, lesions or ulcers Psychiatry: Judgement and insight appear normal. Mood & affect appropriate.   Data Reviewed: I have personally reviewed following labs and imaging studies  CBC: Recent Labs  Lab 06/11/24 1328 06/13/24 0359 06/14/24 0943 06/15/24 0500 06/15/24 0800  WBC 10.3 16.0* 15.9* 11.0* 11.7*  NEUTROABS 8.9*  --   --   --   --   HGB 11.5* 11.1* 11.8* 12.2 12.1  HCT 34.9* 33.8* 35.7* 35.8* 34.0*  MCV 87.3 88.0 85.8 91.1 95.8  PLT 189 232 252 212 222   Basic Metabolic Panel: Recent Labs  Lab 06/11/24 1328 06/13/24 0359 06/15/24 0500 06/15/24 0800  NA 135 138 136 137  K 4.3 4.4 4.5  4.7  CL 101 109 102 102  CO2 23 19* 21* 22  GLUCOSE 228* 208* 159* 146*  BUN 14 18 23  24*  CREATININE 1.13* 0.94 0.90 0.92  CALCIUM 9.9 9.4 10.0 9.7   GFR: Estimated Creatinine Clearance: 43.6 mL/min (by C-G formula based on SCr of 0.92 mg/dL).  Liver Function Tests: Recent Labs  Lab 06/11/24 1328 06/15/24 0500  AST 60* 31  ALT 49* 32  ALKPHOS 70 70  BILITOT 0.4 0.4  PROT 6.7 6.9  ALBUMIN 3.8 3.6   CBG: Recent Labs  Lab 06/13/24 1127 06/13/24 1601 06/13/24 2022 06/14/24 0744 06/14/24 1119  GLUCAP 196* 304* 231* 155* 154*   Sepsis Labs: Recent Labs  Lab 06/11/24 1328  LATICACIDVEN 1.3   Recent Results (from the past 240 hours)  Resp panel by RT-PCR (RSV, Flu A&B, Covid) Anterior Nasal Swab     Status: None   Collection Time: 06/11/24 11:10 AM    Specimen: Anterior Nasal Swab  Result Value Ref Range Status   SARS Coronavirus 2 by RT PCR NEGATIVE NEGATIVE Final    Comment: (NOTE) SARS-CoV-2 target nucleic acids are NOT DETECTED.  The SARS-CoV-2 RNA is generally detectable in upper respiratory specimens during the acute phase of infection. The lowest concentration of SARS-CoV-2 viral copies this assay can detect is 138 copies/mL. A negative result does not preclude SARS-Cov-2 infection and should not be used as the sole basis for treatment or other patient management decisions. A negative result may occur with  improper specimen collection/handling, submission of specimen other than nasopharyngeal swab, presence of viral mutation(s) within the areas targeted by this assay, and inadequate number of viral copies(<138 copies/mL). A negative result must be combined with clinical observations, patient history, and epidemiological information. The expected result is Negative.  Fact Sheet for Patients:  bloggercourse.com  Fact Sheet for Healthcare Providers:  seriousbroker.it  This test is no t yet approved or cleared by the [place]  FDA and  has been authorized for detection and/or diagnosis of SARS-CoV-2 by FDA under an Emergency Use Authorization (EUA). This EUA will remain  in effect (meaning this test can be used) for the duration of the COVID-19 declaration under Section 564(b)(1) of the Act, 21 U.S.C.section 360bbb-3(b)(1), unless the authorization is terminated  or revoked sooner.       Influenza A by PCR NEGATIVE NEGATIVE Final   Influenza B by PCR NEGATIVE NEGATIVE Final    Comment: (NOTE) The Xpert Xpress SARS-CoV-2/FLU/RSV plus assay is intended as an aid in the diagnosis of influenza from Nasopharyngeal swab specimens and should not be used as a sole basis for treatment. Nasal washings and aspirates are unacceptable for Xpert Xpress  SARS-CoV-2/FLU/RSV testing.  Fact Sheet for Patients: bloggercourse.com  Fact Sheet for Healthcare Providers: seriousbroker.it  This test is not yet approved or cleared by the [place]  FDA and has been authorized for detection and/or diagnosis of SARS-CoV-2 by FDA under an Emergency Use Authorization (EUA). This EUA will remain in effect (meaning this test can be used) for the duration of the COVID-19 declaration under Section 564(b)(1) of the Act, 21 U.S.C. section 360bbb-3(b)(1), unless the authorization is terminated or revoked.     Resp Syncytial Virus by PCR NEGATIVE NEGATIVE Final    Comment: (NOTE) Fact Sheet for Patients: bloggercourse.com  Fact Sheet for Healthcare Providers: seriousbroker.it  This test is not yet approved or cleared by the [place]  FDA and has been authorized for detection and/or diagnosis of SARS-CoV-2 by FDA under  an Emergency Use Authorization (EUA). This EUA will remain in effect (meaning this test can be used) for the duration of the COVID-19 declaration under Section 564(b)(1) of the Act, 21 U.S.C. section 360bbb-3(b)(1), unless the authorization is terminated or revoked.  Performed at [facility], [address], [place], [place] [address]     Scheduled Meds:  amLODipine   2.5 mg Oral Daily   cholecalciferol   1,000 Units Oral Daily   ezetimibe   10 mg Oral Daily   fenofibrate   160 mg Oral Daily   ferrous sulfate   325 mg Oral Q breakfast   fluticasone  furoate-vilanterol  1 puff Inhalation Daily   guaiFENesin   1,200 mg Oral BID   mirabegron  ER  50 mg Oral Daily   predniSONE   50 mg Oral Q breakfast   pregabalin   100 mg Oral TID   Continuous Infusions:  cefTRIAXone  (ROCEPHIN )  IV Stopped (06/14/24 1641)    LOS: 3 days   Time spent: 52 minutes  Dr. Sherre Location: [place]  [facility] If  7PM-7AM, please contact night-coverage 06/15/2024, 4:45 PM   "

## 2024-06-15 NOTE — Plan of Care (Signed)

## 2024-06-15 NOTE — Progress Notes (Signed)
 Arrived to find the PT sitting upright in her recliner. PT is Aox4 and acting to her baseline. She stated that she slept well the night before and was feeling better since being home. She c/o having a cough but unable to produce much or any mucus.   Physical exam showed negative trauma to head, neck, or face. PERRLA. Negative JVD or tracheal deviation. Chest expansion is equal with non-labored breathing. L/S were noted to be expiratory wheezing in all lobes. ABD is soft, non-tender with active bowel sounds. PT stated that she has not had a bowel movement since 06/11/24 and believes that it is due to not eating much while she was in the hospital. PT did not want a stool softener at the time as she was going to start eating more while at home. Negative pedal or peripheral edema noted. PT denied being in any pain.   All medications were administered as prescribed. IV was patent. Lab work was obtained. PRN tessalon  pearls were administered due to the cough. After nebulizer treatment PT started to have a productive cough. Mucus was noted to be green and thick appearance. PT had a virtual visit with DO Cox and was discussed that she would possibly d/c 06/16/24. IMM letter was signed.   PT and daughter denied having any other questions at the time and were reminded to call the virtual RN if they needed anything.

## 2024-06-16 ENCOUNTER — Other Ambulatory Visit (HOSPITAL_COMMUNITY): Payer: Self-pay

## 2024-06-16 MED ORDER — PREDNISONE 10 MG PO TABS
10.0000 mg | ORAL_TABLET | ORAL | 0 refills | Status: DC
  Filled 2024-06-16: qty 17, 7d supply, fill #0

## 2024-06-16 MED ORDER — AMLODIPINE BESYLATE 2.5 MG PO TABS
2.5000 mg | ORAL_TABLET | Freq: Every day | ORAL | 1 refills | Status: AC
  Filled 2024-06-16: qty 30, 30d supply, fill #0

## 2024-06-16 MED ORDER — PREDNISONE 10 MG PO TABS
10.0000 mg | ORAL_TABLET | ORAL | 0 refills | Status: DC
  Filled 2024-06-16: qty 16, fill #0

## 2024-06-16 MED ORDER — ONDANSETRON HCL 4 MG PO TABS
4.0000 mg | ORAL_TABLET | Freq: Four times a day (QID) | ORAL | 0 refills | Status: AC | PRN
  Filled 2024-06-16: qty 20, 5d supply, fill #0

## 2024-06-16 MED ORDER — IPRATROPIUM-ALBUTEROL 0.5-2.5 (3) MG/3ML IN SOLN
3.0000 mL | Freq: Four times a day (QID) | RESPIRATORY_TRACT | 1 refills | Status: DC | PRN
  Filled 2024-06-16: qty 180, 15d supply, fill #0

## 2024-06-16 MED ORDER — BENZONATATE 200 MG PO CAPS
200.0000 mg | ORAL_CAPSULE | Freq: Three times a day (TID) | ORAL | 0 refills | Status: AC | PRN
  Filled 2024-06-16: qty 20, 7d supply, fill #0

## 2024-06-16 NOTE — Progress Notes (Signed)
 Pt seen for HatH morning visit/ discharge. Pt appears well and states she slept well. Pt's daughter is present. Pt denies any chills or sweats overnight. Pt denies any pain or other complaints at this time. Pt has also been eating better and had yogurt this morning prior to my arrival.  Vital signs and assessment obtained as noted with the following findings also noted under flowsheets: L/S both upper fields reveal clear/ rhonchi, and both lower fields reveal diminished/ rhonchi. Cough is productive with very light yellow/ clear sputum. Pt has been using I/S and flutter valve. Heart tones s1, s2. Pulses strong and regular. No edema noted. Bowel sounds are active and abdomen is soft and non-tender.  O2 challenge completed and sent to Shanda, RN for home O2 qualification.   Medications administered as noted. Home nebulizer education reviewed and Pt was able to administer treatment by herself with my supervision.   IV care completed and then removed and charted under LDA avatar.  Pt had virtual visit with Dr. Sherre and Shanda, RN. AVS reviewed with Pt and daughter by Shanda, RN.   We will leave HatH concentrator and nebulizer machine #3 at Pt's home until hers are delivered. Discharge medications will be delivered by [facility] staff as soon as they are ready.

## 2024-06-16 NOTE — TOC Transition Note (Addendum)
 Transition of Care [facility]) - Discharge Note   Patient Details  Name: [name] MRN: [id number] Date of Birth: 07/01/1940  [facility] CM/SW Contact:  Corean JAYSON Canary, RN Phone Number: 06/16/2024, 10:08 AM   Clinical Narrative:     Patient is transitioning from Hospital at home program to discharge.  [employer] messaged to bring nebulizer to home Message sent to day shift RNCM to fax over order, find fax number to [place]. Called [place] this [place], no answer to try to get fax information  No further needs identified.  1100 parameters done and qualifies for home oxygen . Oxygen  order in contacted Jermaine from [employer] to bring concentrator. H@H  concentrator left there until it can be delivered Final next level of care: Home w Home Health Services Barriers to Discharge: No Barriers Identified   Patient Goals and CMS Choice            Discharge Placement                       Discharge Plan and Services Additional resources added to the After Visit Summary for   In-house Referral: Clinical Social Work   Post Acute Care Choice: Home Health          DME Arranged: N/A DME Agency: NA                  Social Drivers of Health (SDOH) Interventions SDOH Screenings   Food Insecurity: No Food Insecurity (06/12/2024)  Housing: Low Risk (06/12/2024)  Transportation Needs: No Transportation Needs (06/12/2024)  Utilities: Not At Risk (06/12/2024)  Alcohol Screen: Low Risk (07/14/2022)  Depression (PHQ2-9): Low Risk (04/11/2024)  Financial Resource Strain: Low Risk (10/29/2021)  Physical Activity: Sufficiently Active (04/11/2024)  Social Connections: Moderately Integrated (06/12/2024)  Stress: No Stress Concern Present (04/11/2024)  Tobacco Use: Low Risk (06/12/2024)  Health Literacy: Adequate Health Literacy (04/11/2024)     Readmission Risk Interventions     No data to display

## 2024-06-16 NOTE — Progress Notes (Signed)
 Communicated with patient via video tablet. Patient appears comfortable on the chair. AVS paperwork given to the  patient. Discharge instructions discussed with patient and her daughter, Luke. Patients question answered to satisfaction. Patient agreeable with discharge plan.

## 2024-06-16 NOTE — Discharge Summary (Signed)
 " Physician Discharge Summary - Telemedicine Encounter   Patient: [name] MRN: [id number] DOB: 08-16-1940  Admit date:     06/11/2024  Discharge date: 06/16/24  Discharge Physician: Dr. Sherre   PCP: Antonio Meth, Jamee SAUNDERS, DO   At bedside, patient was able to confirm her first and last name, and date of birth.  Physical exam was completed with assistance of Lauraine Faes, paramedic, who was present at the home during the encounter.  Recommendations at discharge:   Complete steroid taper: 1/19 and 1/20: take 4 tabs (40 mg total) with breakfast. 1/21: take 3 tabs (30 mg total) with breakfast. 1/22 and 1/23: take 2 tabs (20 mg total) with breakfast. 1/24 and 1/25: take 1 tabs with breakfast. Complete the course. A new blood pressure medication: amlodipine  2.5 mg daily has been prescribed.  Duo-nebulizer solution as needed, has been prescribed on discharge. Nebulizer machine has been delivered to you for your use. Follow-up with your primary care provider within 2-4 weeks of discharge for repeat labs: CBC and BMP.  Discharge Diagnoses:  Principal Problem:   Multilobar lung infiltrate Active Problems:   Asthma, mild intermittent   Type 2 diabetes mellitus with hyperglycemia, without long-term current use of insulin  (HCC)   History of pulmonary embolus (PE)   Type 2 diabetes mellitus with diabetic neuropathy (HCC)   Essential hypertension   Hyperlipidemia associated with type 2 diabetes mellitus (HCC)   GERD (gastroesophageal reflux disease)   Acute hypoxic respiratory failure (HCC)   Anxiety and depression  Resolved Problems:   Pneumonia  Hospital Course:  [name] is an 84 year old female with history of hypertension, hyperlipidemia, neuropathy, non-insulin -dependent diabetes mellitus type 2 on metformin , history of PE, aortic atherosclerosis, mild intermittent asthma.  06/11/24: Patient presented to the ED for chief concerns of shortness of breath cough and wheezing since  Sunday evening/Monday morning.  1/13: Patient admitted to [facility] hospitalist service for chief concerns of multilobar pneumonia, pending bed availability.  Vitals in the ED showed Tmax of 101.2, rr of 17, hr 86, blood pressure 146/110, SpO2 of 93% on room air.  Serum sodium is 135, potassium 4.3, chloride 101, bicarb 23, BUN of 14, serum creatinine 1.13, eGFR 48, nonfasting blood glucose 228, AST 60, ALT 49, alk phos is 70, hs troponin is <15 x 2.  Lactic acid was not elevated.  COVID/influenza A/influenza B/RSV PCR were negative.  ED treatment: Azithromycin  500 mg p.o. one-time dose, ceftriaxone  1 g IV one-time dose, DuoNeb treatment, Solu-Medrol  125 mg IV one-time dose, magnesium  2 g IV one-time dose.  06/12/2024: I assumed care of patient.   06/14/2024: Patient transferred to [facility] service. Day 4 of abx. Anticipate discharge 1/17 PM or 1/18 AM.   1/17: I assumed care of patient. Day 5 of abx and complete. Home health PT ordered placed. DME for nebulizer placed.   1/18: DME oxygen  supplementation placed. Patient will be discharge today with tapering oral steroid use. Instructions have been provided via AVS summary and verbally to patient and daughter, Luke at bedside  Assessment and Plan:  * Multilobar lung infiltrate Acute respiratory failure with hypoxia  Decompensated resp status in setting of multilobar PNA and asthma exacerbation  Currently on 2-3L [place] with O2 sats in mid 90s  Admission with left upper lobe and superior segment of the left lower lobe Continue with ceftriaxone  2 g IV daily, azithromycin  500 mg PO daily to complete a 5-day course IS, flutter valve Continue supplemental O2  Asthma exacerbation  treatment as noted above  1/18: DME oxygen  supplementation order placed for 2 L [place].  Asthma, mild intermittent with acute exacerbation Home Breo Ellipta  inhaler daily resumed Cont IV solumedrol 80mg  x1 for-->transition to oral prednisone  in am  DuoNebs 4 times daily scheduled   Albuterol  nebulizer every 4 hours as needed for wheezing and shortness of breath, 5 days ordered Incentive spirometry, flutter valve  06/15/2024: Patient improving.  She is requesting DuoNebs be changed from scheduled to every 6 hours as needed for wheezing and shortness of breath.  DME nebulizer machine placed.  Type 2 diabetes mellitus with hyperglycemia, without long-term current use of insulin  (HCC) Home metformin  will not be resumed on admission Insulin  SSI with at bedtime coverage ordered Goal inpatient blood glucose levels 140-180  Hyperlipidemia associated with type 2 diabetes mellitus (HCC) Home ezetimibe  10 mg daily, fenofibrate  160 mg daily resumed  Essential hypertension SBP in 160s  Continue amlodipine  2.5 mg daily; Rx prescribed on discharged  Type 2 diabetes mellitus with diabetic neuropathy (HCC) Blood sugar in 150s  Will monitor w/ steroid use  BID blood sugars on [facility] program  Consider low dose lantus vs. Glipizide for acute treatment as appropriate  CBG last 2 checks have been in the 150s Change CBG check to twice daily Goal inpatient blood glucose levels 140-180  Anxiety and depression Appears to be baseline anxiety with acute decompensation in setting patient's twin sibling recently passing away in addition to acute resp failure/asthma with steroid use  Will add on prn klonopin  for anxiety  Monitor closely      Pain control - [place]  Controlled Substance Reporting System database was reviewed. and patient was instructed, not to drive, operate heavy machinery, perform activities at heights, swimming or participation in water  activities or provide baby-sitting services while on Pain, Sleep and Anxiety Medications; until their outpatient Physician has advised to do so again. Also recommended to not to take more than prescribed Pain, Sleep and Anxiety Medications.  Consultants: PT, OT, TOC, pharmacy Procedures performed: none  Disposition: Home Diet  recommendation:  Cardiac diet DISCHARGE MEDICATION: Allergies as of 06/16/2024       Reactions   Statins Other (See Comments)   Myalgias and muscle weakness   Jardiance  [empagliflozin ] Rash        Medication List     STOP taking these medications    fluconazole  150 MG tablet Commonly known as: Diflucan        TAKE these medications    Accu-Chek Softclix Lancets lancets USE TO TEST BLOOD SUGAR TWICE DAILY AS DIRECTED   albuterol  (2.5 MG/3ML) 0.083% nebulizer solution Commonly known as: PROVENTIL  Take 3 mLs (2.5 mg total) by nebulization every 6 (six) hours as needed for wheezing or shortness of breath. What changed: Another medication with the same name was changed. Make sure you understand how and when to take each.   albuterol  108 (90 Base) MCG/ACT inhaler Commonly known as: VENTOLIN  HFA USE 2 INHALATIONS BY MOUTH  EVERY 4 TO 6 HOURS AS  NEEDED What changed:  how much to take how to take this when to take this reasons to take this   amLODipine  2.5 MG tablet Commonly known as: NORVASC  Take 1 tablet (2.5 mg total) by mouth daily.   ASPERCREME EX Apply 1 application topically daily as needed (back pain).   azelastine  0.1 % nasal spray Commonly known as: ASTELIN  Use 1-2 puffs in each nostril once or twice daily What changed:  how much to take how to  take this when to take this   benzonatate  200 MG capsule Commonly known as: TESSALON  Take 1 capsule (200 mg total) by mouth 3 (three) times daily as needed (cough).   blood glucose meter kit and supplies Kit Dispense based on patient and insurance preference. Use up to four times daily as directed.   Breo Ellipta  100-25 MCG/ACT Aepb Generic drug: fluticasone  furoate-vilanterol Inhale 1 puff into the lungs daily.   fluticasone  furoate-vilanterol 200-25 MCG/ACT Aepb Commonly known as: BREO ELLIPTA  Inhale 1 puff into the lungs daily.   CALCIUM + D PO Take 600 mg by mouth in the morning and at bedtime.    celecoxib  200 MG capsule Commonly known as: CELEBREX  TAKE 1 CAPSULE(200 MG) BY MOUTH DAILY What changed:  how much to take how to take this when to take this   cetirizine 10 MG tablet Commonly known as: ZYRTEC Take 10 mg by mouth daily as needed for allergies.   chlorpheniramine-HYDROcodone  10-8 MG/5ML Commonly known as: TUSSIONEX Take 5 mLs by mouth every 12 (twelve) hours as needed for up to 2 days for cough.   cholecalciferol  25 MCG (1000 UNIT) tablet Commonly known as: VITAMIN D3 Take 1,000 Units by mouth daily.   clonazePAM  0.25 MG disintegrating tablet Commonly known as: KLONOPIN  Take 1 tablet (0.25 mg total) by mouth 2 (two) times daily as needed (anxiety).   clotrimazole -betamethasone  cream Commonly known as: LOTRISONE  Apply 1 Application topically daily.   cyclobenzaprine  10 MG tablet Commonly known as: FLEXERIL  TAKE 1 TABLET BY MOUTH THREE TIMES DAILY AS NEEDED FOR HIP CONCERNS What changed: See the new instructions.   diclofenac Sodium 1 % Gel Commonly known as: VOLTAREN Apply 2 g topically daily as needed (Pain).   ezetimibe  10 MG tablet Commonly known as: ZETIA  Take 1 tablet (10 mg total) by mouth daily.   fenofibrate  160 MG tablet TAKE 1 TABLET(160 MG) BY MOUTH DAILY What changed: See the new instructions.   FeroSul 325 (65 Fe) MG tablet Generic drug: ferrous sulfate  TAKE 1 TABLET(325 MG) BY MOUTH DAILY WITH BREAKFAST What changed: See the new instructions.   glucose blood test strip Use to check sugars twice daily   ipratropium-albuterol  0.5-2.5 (3) MG/3ML Soln Commonly known as: DUONEB Take 3 mLs by nebulization every 6 (six) hours as needed.   lidocaine  4 % Apply 1 patch topically as needed (back pain).   metFORMIN  500 MG 24 hr tablet Commonly known as: GLUCOPHAGE -XR Take 1 tablet with morning meal, 1 tablet with lunch and 2 tablets with evening meal. What changed:  how much to take how to take this when to take this additional  instructions   methocarbamol  500 MG tablet Commonly known as: ROBAXIN  TAKE 1 TABLET(500 MG) BY MOUTH EVERY 6 HOURS AS NEEDED FOR MUSCLE SPASMS What changed: See the new instructions.   mirabegron  ER 50 MG Tb24 tablet Commonly known as: MYRBETRIQ  TAKE 1 TABLET(50 MG) BY MOUTH DAILY What changed: See the new instructions.   multivitamin with minerals Tabs tablet Take 1 tablet by mouth daily. Centrum One a day - 50+   mupirocin  ointment 2 % Commonly known as: BACTROBAN  Apply 1 Application topically 3 (three) times daily.   ondansetron  4 MG tablet Commonly known as: ZOFRAN  Take 1 tablet (4 mg total) by mouth every 6 (six) hours as needed for nausea.   polyethylene glycol 17 g packet Commonly known as: MIRALAX  / GLYCOLAX  Take 17 g by mouth daily as needed for mild constipation.   predniSONE  10 MG  tablet Commonly known as: DELTASONE  Take 1-4 tablets (10-40 mg total) by mouth See admin instructions. 1/19 and 1/20: take 4 tabs (40 mg total) with breakfast. 1/21: take 3 tabs (30 mg total) with breakfast. 1/22 and 1/23: take 2 tabs (20 mg total) with breakfast. 1/24 and 1/25: take 1 tabs with breakfast. Complete the course. Start taking on: June 17, 2024   pregabalin  100 MG capsule Commonly known as: LYRICA  Take 1 capsule (100 mg total) by mouth 3 (three) times daily.   PRESERVISION AREDS 2+MULTI VIT PO Take 1 tablet by mouth in the morning and at bedtime.   SYSTANE OP Place 1 drop into both eyes daily as needed (dry eyes).   traMADol  50 MG tablet Commonly known as: ULTRAM  Take 0.5 tablets (25 mg total) by mouth every 6 (six) hours as needed for up to 2 days for moderate pain (pain score 4-6). What changed:  how much to take reasons to take this   Vascepa  1 g capsule Generic drug: icosapent  Ethyl TAKE 2 CAPSULES(2 GRAMS) BY MOUTH TWICE DAILY What changed:  how much to take how to take this when to take this               Durable Medical Equipment  (From  admission, onward)           Start     Ordered   06/16/24 1103  For home use only DME oxygen   Once       Question Answer Comment  Length of Need Lifetime   Mode or (Route) Nasal cannula   Liters per Minute 2   Frequency Continuous (stationary and portable oxygen  unit needed)   Oxygen  conserving device Yes   Oxygen  delivery system: Gas   Oxygen  delivery system: Concentrator      06/16/24 1103   06/16/24 1054  For home use only DME oxygen   Once       Question Answer Comment  Length of Need 6 Months   Mode or (Route) Nasal cannula   Liters per Minute 3   Oxygen  delivery system: Gas   Oxygen  delivery system: Concentrator   Oxygen  delivery system: Portable concentrator (POC)   Oxygen  delivery system: Gas/Tank      06/16/24 1053   06/15/24 1027  For home use only DME Nebulizer machine  Once       Question Answer Comment  Patient needs a nebulizer to treat with the following condition Community acquired pneumonia   Patient needs a nebulizer to treat with the following condition Multilobar lung infiltrate   Patient needs a nebulizer to treat with the following condition Bronchitis   Length of Need 6 Months   Additional equipment included Administration kit   Additional equipment included Filter      06/15/24 1027           Discharge Exam: Filed Weights   06/12/24 1130  Weight: 70.3 kg   Physical Exam Vitals and nursing note reviewed. Exam conducted with a chaperone present.  Constitutional:      Appearance: Normal appearance.  HENT:     Head: Normocephalic and atraumatic.     Right Ear: External ear normal.     Left Ear: External ear normal.     Nose: Nose normal.  Eyes:     General: No scleral icterus.    Extraocular Movements: Extraocular movements intact.     Conjunctiva/sclera: Conjunctivae normal.  Neck:     Comments: And midline. Cardiovascular:     Rate and  Rhythm: Normal rate and regular rhythm.     Heart sounds: Normal heart sounds. No murmur  heard. Pulmonary:     Effort: Pulmonary effort is normal.     Breath sounds: Rhonchi present.     Comments: Diminished bilateral lower lobes. Mild rhonchi throughout Abdominal:     General: Bowel sounds are normal.     Palpations: Abdomen is soft.     Tenderness: There is no abdominal tenderness.  Musculoskeletal:     Cervical back: Normal range of motion.     Right lower leg: No edema.     Left lower leg: No edema.  Skin:    Coloration: Skin is not jaundiced or pale.  Neurological:     General: No focal deficit present.     Mental Status: She is alert and oriented to person, place, and time.  Psychiatric:        Mood and Affect: Mood normal.        Behavior: Behavior normal.        Thought Content: Thought content normal.        Judgment: Judgment normal.   Condition at discharge: good  The results of significant diagnostics from this hospitalization (including imaging, microbiology, ancillary and laboratory) are listed below for reference.   Imaging Studies: CT Angio Chest PE W and/or Wo Contrast Result Date: 06/11/2024 CLINICAL DATA:  Cough, congestion wheezing EXAM: CT ANGIOGRAPHY CHEST WITH CONTRAST TECHNIQUE: Multidetector CT imaging of the chest was performed using the standard protocol during bolus administration of intravenous contrast. Multiplanar CT image reconstructions and MIPs were obtained to evaluate the vascular anatomy. RADIATION DOSE REDUCTION: This exam was performed according to the departmental dose-optimization program which includes automated exposure control, adjustment of the mA and/or kV according to patient size and/or use of iterative reconstruction technique. CONTRAST:  75mL OMNIPAQUE  IOHEXOL  350 MG/ML SOLN COMPARISON:  05/11/2021 CTA chest 06/11/2024 chest x-ray FINDINGS: Cardiovascular: Intact thoracic aorta without aneurysm or dissection. No acute aortic process, mediastinal hemorrhage or hematoma. Patent 3 vessel arch anatomy. Aortic atherosclerosis  present. Central pulmonary arteries are normal in caliber and patent. No significant filling defect or pulmonary embolus by CTA. Normal heart size. No pericardial effusion. Central venous structures are patent. No veno-occlusive process. Mediastinum/Nodes: Thyroid  unremarkable. Trachea and central airways are patent. Esophagus nondilated. Small hiatal hernia noted. Mildly prominent scattered mediastinal, hilar and subcarinal lymph nodes, short axis measurements all 9 mm or less favored to be reactive. No abnormal bulky adenopathy. Lungs/Pleura: Densely consolidative left upper lobe airspace opacity posteriorly abutting the fissure with central air bronchograms, consistent with consolidative pneumonia. This correlates with the chest x-ray finding. Additional areas of bronchovascular patchy airspace opacity in the superior segment of the left lower lobe also compatible with pneumonia. Minor dependent basilar atelectasis in both lower lobes. No pleural abnormality, effusion, or pneumothorax. Upper Abdomen: Tiny layering gallstones noted, some are calcified. No large focal hepatic abnormality. Abdominal aorta atherosclerotic. No acute upper abdominal finding. Musculoskeletal: Degenerative changes noted throughout the spine and shoulders. Review of the MIP images confirms the above findings. IMPRESSION: 1. Negative for significant acute pulmonary embolus by CTA. 2. Densely consolidative left upper lobe pneumonia and additional patchy pneumonia in the superior segment of the left lower lobe. 3. Recommend follow-up after medical therapy to document resolution. 4. Aortic atherosclerosis. Aortic Atherosclerosis (ICD10-I70.0). Electronically Signed   By: CHRISTELLA.  Shick M.D.   On: 06/11/2024 15:41   DG Chest 2 View Result Date: 06/11/2024 EXAM: 2 VIEW(S) XRAY OF THE  CHEST 06/11/2024 11:50:00 AM COMPARISON: 11/27/2023 CLINICAL HISTORY: The patient presents with shortness of breath. FINDINGS: LUNGS AND PLEURA: New left upper lobe  mass-like opacity with possible internal cavitation measuring approximately 7.1 x 4.4 cm. No pleural effusion. No pneumothorax. HEART AND MEDIASTINUM: No acute abnormality of the cardiac and mediastinal silhouettes. BONES AND SOFT TISSUES: Surgical hardware in lower cervical and lumbar spine partially included. No acute osseous abnormality. IMPRESSION: 1. New left upper lobe mass-like opacity with possible internal cavitation measuring approximately 7.1 x 4.4 cm, which may represent primary lung malignancy or cavitary infection (including cavitary pneumonia or tuberculosis), with inflammatory etiologies less likely. 2. Recommend contrast-enhanced CT chest for further characterization and to guide management; if infection is suspected and treated, repeat chest radiograph in 6-8 weeks to document resolution, and if persistent, consider PET/CT and tissue sampling. Electronically signed by: Waddell Calk MD MD 06/11/2024 12:15 PM EST RP Workstation: [id number]   ECHOCARDIOGRAM COMPLETE Result Date: 05/31/2024    ECHOCARDIOGRAM REPORT   Patient Name:   [name]   Date of Exam: 05/31/2024 Medical Rec #:  [id number]     Height:       63.5 in Accession #:    [id number]    Weight:       157.0 lb Date of Birth:  05/02/1941      BSA:          1.755 m Patient Age:    83 years      BP:           118/82 mmHg Patient Gender: F             HR:           60 bpm. Exam Location:  [address] Procedure: 2D Echo, Color Doppler and Cardiac Doppler (Both Spectral and Color            Flow Doppler were utilized during procedure). Indications:    I35.0 AS  History:        Patient has prior history of Echocardiogram examinations, most                 recent 10/27/2023. Aortic Valve Disease; Risk Factors:Hx Cancer,                 Hypertension, Dyslipidemia and Diabetes.  Sonographer:    Powell Saras Referring Phys: [phone] KENNETH C HILTY IMPRESSIONS  1. Left ventricular ejection fraction, by estimation, is 55 to 60%. The left ventricle  has normal function. The left ventricle has no regional wall motion abnormalities. Left ventricular diastolic parameters are consistent with Grade I diastolic dysfunction (impaired relaxation).  2. Right ventricular systolic function is normal. The right ventricular size is normal. There is normal pulmonary artery systolic pressure.  3. Left atrial size was mildly dilated.  4. The mitral valve is degenerative. Trivial mitral valve regurgitation. No evidence of mitral stenosis.  5. The aortic valve is calcified. Aortic valve regurgitation is not visualized. Moderate aortic valve stenosis. Aortic valve area, by VTI measures 0.99 cm. Aortic valve mean gradient measures 19.0 mmHg. Aortic valve Vmax measures 2.92 m/s.  6. The inferior vena cava is normal in size with greater than 50% respiratory variability, suggesting right atrial pressure of 3 mmHg.  7. Cannot exclude a small PFO. Comparison(s): A prior study was performed on 10/27/2023. Previously there was concern for low flow low gradient moderate aortic stenosis. Vmax has increased from 2.5 m/s to 2.9 m/s today with improved DI (0.26), AVA (0.8  cm), SVI (31). Otherwise no significant change. FINDINGS  Left Ventricle: Left ventricular ejection fraction, by estimation, is 55 to 60%. The left ventricle has normal function. The left ventricle has no regional wall motion abnormalities. The left ventricular internal cavity size was normal in size. There is  no left ventricular hypertrophy. Left ventricular diastolic parameters are consistent with Grade I diastolic dysfunction (impaired relaxation). Right Ventricle: The right ventricular size is normal. No increase in right ventricular wall thickness. Right ventricular systolic function is normal. There is normal pulmonary artery systolic pressure. The tricuspid regurgitant velocity is 2.48 m/s, and  with an assumed right atrial pressure of 3 mmHg, the estimated right ventricular systolic pressure is 27.6 mmHg. Left  Atrium: Left atrial size was mildly dilated. Right Atrium: Right atrial size was normal in size. Pericardium: There is no evidence of pericardial effusion. Mitral Valve: The mitral valve is degenerative in appearance. Trivial mitral valve regurgitation. No evidence of mitral valve stenosis. Tricuspid Valve: The tricuspid valve is normal in structure. Tricuspid valve regurgitation is mild . No evidence of tricuspid stenosis. Aortic Valve: The aortic valve is calcified. Aortic valve regurgitation is not visualized. Moderate aortic stenosis is present. Aortic valve mean gradient measures 19.0 mmHg. Aortic valve peak gradient measures 34.1 mmHg. Aortic valve area, by VTI measures 0.99 cm. Pulmonic Valve: The pulmonic valve was normal in structure. Pulmonic valve regurgitation is not visualized. No evidence of pulmonic stenosis. Aorta: The aortic root and ascending aorta are structurally normal, with no evidence of dilitation. Venous: The inferior vena cava is normal in size with greater than 50% respiratory variability, suggesting right atrial pressure of 3 mmHg. IAS/Shunts: Cannot exclude a small PFO.  LEFT VENTRICLE PLAX 2D LVIDd:         4.10 cm   Diastology LVIDs:         2.90 cm   LV e' medial:    5.50 cm/s LV PW:         1.00 cm   LV E/e' medial:  11.5 LV IVS:        0.90 cm   LV e' lateral:   6.50 cm/s LVOT diam:     2.00 cm   LV E/e' lateral: 9.7 LV SV:         63 LV SV Index:   36 LVOT Area:     3.14 cm  RIGHT VENTRICLE             IVC RV Basal diam:  3.30 cm     IVC diam: 1.50 cm RV S prime:     10.50 cm/s TAPSE (M-mode): 1.9 cm LEFT ATRIUM             Index        RIGHT ATRIUM           Index LA diam:        3.50 cm 1.99 cm/m   RA Area:     15.40 cm LA Vol (A2C):   56.5 ml 32.20 ml/m  RA Volume:   38.60 ml  22.00 ml/m LA Vol (A4C):   45.0 ml 25.64 ml/m LA Biplane Vol: 50.6 ml 28.83 ml/m  AORTIC VALVE AV Area (Vmax):    1.08 cm AV Area (Vmean):   1.01 cm AV Area (VTI):     0.99 cm AV Vmax:            292.00 cm/s AV Vmean:          196.000 cm/s AV VTI:  0.640 m AV Peak Grad:      34.1 mmHg AV Mean Grad:      19.0 mmHg LVOT Vmax:         100.00 cm/s LVOT Vmean:        63.200 cm/s LVOT VTI:          0.201 m LVOT/AV VTI ratio: 0.31  AORTA Ao Root diam: 2.60 cm Ao Asc diam:  3.00 cm MITRAL VALVE               TRICUSPID VALVE MV Area (PHT): 2.27 cm    TR Peak grad:   24.6 mmHg MV Decel Time: 334 msec    TR Vmax:        248.00 cm/s MV E velocity: 63.10 cm/s MV A velocity: 69.30 cm/s  SHUNTS MV E/A ratio:  0.91        Systemic VTI:  0.20 m                            Systemic Diam: 2.00 cm Emeline Calender Electronically signed by Emeline Calender Signature Date/Time: 05/31/2024/2:15:26 PM    Final     Microbiology: Results for orders placed or performed during the hospital encounter of 06/11/24  Resp panel by RT-PCR (RSV, Flu A&B, Covid) Anterior Nasal Swab     Status: None   Collection Time: 06/11/24 11:10 AM   Specimen: Anterior Nasal Swab  Result Value Ref Range Status   SARS Coronavirus 2 by RT PCR NEGATIVE NEGATIVE Final    Comment: (NOTE) SARS-CoV-2 target nucleic acids are NOT DETECTED.  The SARS-CoV-2 RNA is generally detectable in upper respiratory specimens during the acute phase of infection. The lowest concentration of SARS-CoV-2 viral copies this assay can detect is 138 copies/mL. A negative result does not preclude SARS-Cov-2 infection and should not be used as the sole basis for treatment or other patient management decisions. A negative result may occur with  improper specimen collection/handling, submission of specimen other than nasopharyngeal swab, presence of viral mutation(s) within the areas targeted by this assay, and inadequate number of viral copies(<138 copies/mL). A negative result must be combined with clinical observations, patient history, and epidemiological information. The expected result is Negative.  Fact Sheet for Patients:   bloggercourse.com  Fact Sheet for Healthcare Providers:  seriousbroker.it  This test is no t yet approved or cleared by the [place]  FDA and  has been authorized for detection and/or diagnosis of SARS-CoV-2 by FDA under an Emergency Use Authorization (EUA). This EUA will remain  in effect (meaning this test can be used) for the duration of the COVID-19 declaration under Section 564(b)(1) of the Act, 21 U.S.C.section 360bbb-3(b)(1), unless the authorization is terminated  or revoked sooner.       Influenza A by PCR NEGATIVE NEGATIVE Final   Influenza B by PCR NEGATIVE NEGATIVE Final    Comment: (NOTE) The Xpert Xpress SARS-CoV-2/FLU/RSV plus assay is intended as an aid in the diagnosis of influenza from Nasopharyngeal swab specimens and should not be used as a sole basis for treatment. Nasal washings and aspirates are unacceptable for Xpert Xpress SARS-CoV-2/FLU/RSV testing.  Fact Sheet for Patients: bloggercourse.com  Fact Sheet for Healthcare Providers: seriousbroker.it  This test is not yet approved or cleared by the [place]  FDA and has been authorized for detection and/or diagnosis of SARS-CoV-2 by FDA under an Emergency Use Authorization (EUA). This EUA will remain in effect (meaning this test can  be used) for the duration of the COVID-19 declaration under Section 564(b)(1) of the Act, 21 U.S.C. section 360bbb-3(b)(1), unless the authorization is terminated or revoked.     Resp Syncytial Virus by PCR NEGATIVE NEGATIVE Final    Comment: (NOTE) Fact Sheet for Patients: bloggercourse.com  Fact Sheet for Healthcare Providers: seriousbroker.it  This test is not yet approved or cleared by the [place]  FDA and has been authorized for detection and/or diagnosis of SARS-CoV-2 by FDA under an Emergency Use  Authorization (EUA). This EUA will remain in effect (meaning this test can be used) for the duration of the COVID-19 declaration under Section 564(b)(1) of the Act, 21 U.S.C. section 360bbb-3(b)(1), unless the authorization is terminated or revoked.  Performed at [facility], [address] [place], [place] [address]     Labs: CBC: Recent Labs  Lab 06/11/24 1328 06/13/24 0359 06/14/24 0943 06/15/24 0500 06/15/24 0800  WBC 10.3 16.0* 15.9* 11.0* 11.7*  NEUTROABS 8.9*  --   --   --   --   HGB 11.5* 11.1* 11.8* 12.2 12.1  HCT 34.9* 33.8* 35.7* 35.8* 34.0*  MCV 87.3 88.0 85.8 91.1 95.8  PLT 189 232 252 212 222   Basic Metabolic Panel: Recent Labs  Lab 06/11/24 1328 06/13/24 0359 06/15/24 0500 06/15/24 0800  NA 135 138 136 137  K 4.3 4.4 4.5 4.7  CL 101 109 102 102  CO2 23 19* 21* 22  GLUCOSE 228* 208* 159* 146*  BUN 14 18 23  24*  CREATININE 1.13* 0.94 0.90 0.92  CALCIUM 9.9 9.4 10.0 9.7   Liver Function Tests: Recent Labs  Lab 06/11/24 1328 06/15/24 0500  AST 60* 31  ALT 49* 32  ALKPHOS 70 70  BILITOT 0.4 0.4  PROT 6.7 6.9  ALBUMIN 3.8 3.6   CBG: Recent Labs  Lab 06/13/24 1127 06/13/24 1601 06/13/24 2022 06/14/24 0744 06/14/24 1119  GLUCAP 196* 304* 231* 155* 154*   Discharge time spent: greater than 30 minutes.  Signed: Dr. Sherre [facility] Location: [place]  06/16/2024 "

## 2024-06-16 NOTE — Progress Notes (Signed)
 SATURATION QUALIFICATIONS: (This note is used to comply with regulatory documentation for home oxygen )  Patient Saturations on Room Air at Rest = 90%  Patient Saturations on Room Air while Ambulating = 87%  Patient Saturations on 2 Liters of oxygen  while Ambulating = 93%  Please briefly explain why patient needs home oxygen : New oxygen  requirement needed for low blood oxygen  level.

## 2024-06-16 NOTE — Progress Notes (Signed)
 This clinical research associate took AVS papers to Radiation Protection Practitioner, Lauraine at the patients house. This writer picked up IV pump, clear medication bins and medications not in use. This writer left the patients house, Lauraine was still there with the patient. ---------------------------------------------------------- [name]. Franchot, EMT

## 2024-06-17 ENCOUNTER — Telehealth: Payer: Self-pay

## 2024-06-17 NOTE — Transitions of Care (Post Inpatient/ED Visit) (Signed)
 "  06/17/2024  Name: [name] MRN: [id number] DOB: [birth year]  Today's TOC FU Call Status: Today's TOC FU Call Status:: Successful TOC FU Call Completed TOC FU Call Complete Date: 06/17/24  Patient's Name and Date of Birth confirmed. Name, DOB  Transition Care Management Follow-up Telephone Call Date of Discharge: 06/16/24 Discharge Facility: [facility]) Type of Discharge: Inpatient Admission Primary Inpatient Discharge Diagnosis:: Pneumonia How have you been since you were released from the hospital?: Better Any questions or concerns?: No  Items Reviewed: Did you receive and understand the discharge instructions provided?: Yes Medications obtained,verified, and reconciled?: Yes (Medications Reviewed) Any new allergies since your discharge?: No Dietary orders reviewed?: Yes Type of Diet Ordered:: Low sodium Heart Healthy Do you have support at home?: Yes People in Home [RPT]: alone Name of Support/Comfort Primary Source: Luke Pry  Medications Reviewed Today: Medications Reviewed Today     Reviewed by [name], RN (Case Manager) on 06/17/24 at 1515  Med List Status: <None>   Medication Order Taking? Sig Documenting Provider Last Dose Status Informant  Accu-Chek Softclix Lancets lancets [id number]  USE TO TEST BLOOD SUGAR TWICE DAILY AS DIRECTED Antonio Meth, Yvonne R, DO  Active Self, Pharmacy Records  albuterol  (PROVENTIL ) (2.5 MG/3ML) 0.083% nebulizer solution [id number]  Take 3 mLs (2.5 mg total) by nebulization every 6 (six) hours as needed for wheezing or shortness of breath. Neysa Reggy BIRCH, MD  Active Self, Pharmacy Records  albuterol  (VENTOLIN  HFA) 108 [phone] Base) MCG/ACT inhaler [id number]  USE 2 INHALATIONS BY MOUTH  EVERY 4 TO 6 HOURS AS  NEEDED  Patient taking differently: Inhale 2 puffs into the lungs every 4 (four) hours as needed for wheezing or shortness of breath. USE 2 INHALATIONS BY MOUTH  EVERY 4 TO 6 HOURS AS  NEEDED   Neysa Reggy BIRCH, MD   Active Self, Pharmacy Records  amLODipine  (NORVASC ) 2.5 MG tablet [id number]  Take 1 tablet (2.5 mg total) by mouth daily. Cox, Amy N, DO  Active   azelastine  (ASTELIN ) 0.1 % nasal spray [id number]  Use 1-2 puffs in each nostril once or twice daily  Patient taking differently: Place 2 sprays into both nostrils 2 (two) times daily. Use 1-2 puffs in each nostril once or twice daily   Neysa Reggy D, MD  Active Self, Pharmacy Records  benzonatate  (TESSALON ) 200 MG capsule [id number]  Take 1 capsule (200 mg total) by mouth 3 (three) times daily as needed (cough). Cox, Amy N, DO  Active   blood glucose meter kit and supplies KIT [id number]  Dispense based on patient and insurance preference. Use up to four times daily as directed. Antonio Meth [name], DO  Active Self, Pharmacy Records  BREO ELLIPTA  100-25 MCG/ACT AEPB [id number]  Inhale 1 puff into the lungs daily.  Patient not taking: Reported on 06/12/2024   Neysa Reggy BIRCH, MD  Active Self, Pharmacy Records  Calcium Citrate-Vitamin D  (CALCIUM + D PO) [id number]  Take 600 mg by mouth in the morning and at bedtime. [provider]  Active Self, Pharmacy Records  celecoxib  (CELEBREX ) 200 MG capsule [id number]  TAKE 1 CAPSULE(200 MG) BY MOUTH DAILY  Patient taking differently: Take 200 mg by mouth daily. TAKE 1 CAPSULE(200 MG) BY MOUTH DAILY   Antonio Meth, Yvonne R, DO  Active Self, Pharmacy Records  cetirizine (ZYRTEC) 10 MG tablet [id number]  Take 10 mg by mouth daily as needed for allergies. [provider]  Active Self, Pharmacy Records  cholecalciferol  (VITAMIN  D3) 25 MCG (1000 UNIT) tablet [id number]  Take 1,000 Units by mouth daily. [provider]  Active Self, Pharmacy Records  clonazepam  (KLONOPIN ) 0.25 MG disintegrating tablet [id number]  Take 1 tablet (0.25 mg total) by mouth 2 (two) times daily as needed (anxiety). Eldonna Elspeth PARAS, MD  Expired 06/16/24 2359   clotrimazole -betamethasone  (LOTRISONE ) cream [id number]  Apply 1  Application topically daily.  Patient not taking: Reported on 06/12/2024   Antonio Cyndee [name], DO  Active Self, Pharmacy Records  cyclobenzaprine  (FLEXERIL ) 10 MG tablet [id number]  TAKE 1 TABLET BY MOUTH THREE TIMES DAILY AS NEEDED FOR HIP CONCERNS  Patient taking differently: Take 10 mg by mouth 3 (three) times daily as needed for muscle spasms. TAKE 1 TABLET BY MOUTH THREE TIMES DAILY AS NEEDED FOR HIP CONCERNS   Antonio Cyndee [name], DO  Active Self, Pharmacy Records  diclofenac Sodium (VOLTAREN) 1 % GEL [id number]  Apply 2 g topically daily as needed (Pain). [provider]  Active Self, Pharmacy Records  ezetimibe  (ZETIA ) 10 MG tablet [id number]  Take 1 tablet (10 mg total) by mouth daily. Antonio Cyndee [name], DO  Active Self, Pharmacy Records  fenofibrate  160 MG tablet [id number]  TAKE 1 TABLET(160 MG) BY MOUTH DAILY  Patient taking differently: Take 160 mg by mouth daily.   Mona Vinie BROCKS, MD  Active Self, Pharmacy Records  FEROSUL 325 (65 Fe) MG tablet [id number]  TAKE 1 TABLET(325 MG) BY MOUTH DAILY WITH BREAKFAST  Patient taking differently: Take 325 mg by mouth daily.   Lowne Chase, Yvonne R, DO  Active Self, Pharmacy Records  fluticasone  furoate-vilanterol (BREO ELLIPTA ) 200-25 MCG/ACT AEPB [id number]  Inhale 1 puff into the lungs daily. Antonio Cyndee [name], DO  Active Self, Pharmacy Records  glucose blood test strip [id number]  Use to check sugars twice daily Antonio Cyndee, Yvonne R, DO  Active Self, Pharmacy Records  ipratropium-albuterol  (DUONEB) 0.5-2.5 (3) MG/3ML SOLN [id number]  Take 3 mLs by nebulization every 6 (six) hours as needed. Cox, Amy N, DO  Active   Lidocaine  4 % PTCH [id number]  Apply 1 patch topically as needed (back pain). [provider]  Active Self, Pharmacy Records  metFORMIN  (GLUCOPHAGE -XR) 500 MG 24 hr tablet [id number]  Take 1 tablet with morning meal, 1 tablet with lunch and 2 tablets with evening meal.  Patient taking differently: Take 500  mg by mouth in the morning, at noon, and at bedtime. Take 1 tablet with morning meal, 1 tablet with lunch and 1 tablet with evening meal.   Antonio Cyndee, Yvonne R, DO  Active Self, Pharmacy Records  methocarbamol  (ROBAXIN ) 500 MG tablet [id number]  TAKE 1 TABLET(500 MG) BY MOUTH EVERY 6 HOURS AS NEEDED FOR MUSCLE SPASMS  Patient taking differently: Take 1,000 mg by mouth every 6 (six) hours as needed for muscle spasms.   Antonio Cyndee [name], DO  Active Self, Pharmacy Records  mirabegron  ER (MYRBETRIQ ) 50 MG TB24 tablet [id number]  TAKE 1 TABLET(50 MG) BY MOUTH DAILY  Patient taking differently: Take 50 mg by mouth daily.   Antonio Cyndee [name], DO  Active Self, Pharmacy Records  Multiple Vitamin (MULTIVITAMIN WITH MINERALS) TABS tablet [id number]  Take 1 tablet by mouth daily. Centrum One a day - 50+ [provider]  Active Self, Pharmacy Records  Multiple Vitamins-Minerals (PRESERVISION AREDS 2+MULTI VIT PO) [id number]  Take 1 tablet by mouth in the morning and at bedtime. [provider]  Active Self, Pharmacy Records  mupirocin  ointment (BACTROBAN ) 2 % [id number]  Apply 1 Application topically 3 (three) times daily.  Patient not taking: Reported on 06/12/2024   Cyndi Shaver, PA-C  Active Self, Pharmacy Records  ondansetron  (ZOFRAN ) 4 MG tablet [id number]  Take 1 tablet (4 mg total) by mouth every 6 (six) hours as needed for nausea. Cox, Amy LOISE, DO  Active   OXYGEN  [id number] Yes Inhale 2 L/min into the lungs continuous. [provider]  Active   Polyethyl Glycol-Propyl Glycol (SYSTANE OP) [id number]  Place 1 drop into both eyes daily as needed (dry eyes). [provider]  Active Self, Pharmacy Records  polyethylene glycol (MIRALAX  / GLYCOLAX ) packet [id number]  Take 17 g by mouth daily as needed for mild constipation.  [provider]  Active Self, Pharmacy Records  predniSONE  (DELTASONE ) 10 MG tablet [id number]  Take 1-4 tablets (10-40 mg total) by mouth See  admin instructions. 1/19 and 1/20: take 4 tabs (40 mg total) with breakfast. 1/21: take 3 tabs (30 mg total) with breakfast. 1/22 and 1/23: take 2 tabs (20 mg total) with breakfast. 1/24 and 1/25: take 1 tabs with breakfast. Complete the course. Cox, Amy N, DO  Active   pregabalin  (LYRICA ) 100 MG capsule [id number]  Take 1 capsule (100 mg total) by mouth 3 (three) times daily. Eldonna Elspeth PARAS, MD  Expired 06/16/24 2359   Trolamine Salicylate (ASPERCREME EX) [id number]  Apply 1 application topically daily as needed (back pain).  Patient not taking: Reported on 06/12/2024   [provider]  Active Self, Pharmacy Records  VASCEPA  1 g capsule [id number]  TAKE 2 CAPSULES(2 GRAMS) BY MOUTH TWICE DAILY  Patient taking differently: Take 4 g by mouth 2 (two) times daily. TAKE 2 CAPSULES(2 GRAMS) BY MOUTH TWICE DAILY   Hilty, Vinie BROCKS, MD  Active Self, Pharmacy Records            Home Care and Equipment/Supplies: Were Home Health Services Ordered?: Yes [facility] Has Agency set up a time to come to your home?: No (The Community that the patient lives in has inhouse PT) EMR reviewed for Home Health Orders: Orders present/patient has not received call (refer to CM for follow-up) Any new equipment or medical supplies ordered?: Yes Name of Medical supply agency?: [employer] Were you able to get the equipment/medical supplies?: Yes Do you have any questions related to the use of the equipment/supplies?: No  Functional Questionnaire: Do you need assistance with bathing/showering or dressing?: No Do you need assistance with meal preparation?: No Do you need assistance with eating?: No Do you have difficulty maintaining continence: No Do you need assistance with getting out of bed/getting out of a chair/moving?: No Do you have difficulty managing or taking your medications?: No  Follow up appointments reviewed: PCP Follow-up appointment confirmed?: Yes Date of PCP  follow-up appointment?: 06/27/24 Follow-up Provider: Dr. Cruz Specialist [facility] Follow-up appointment confirmed?: Yes Date of Specialist follow-up appointment?: 08/08/24 Follow-Up Specialty Provider:: Dr. Winfred Do you need transportation to your follow-up appointment?: No Do you understand care options if your condition(s) worsen?: Yes-patient verbalized understanding  SDOH Interventions Today    Flowsheet Row Most Recent Value  SDOH Interventions   Food Insecurity Interventions Intervention Not Indicated  Housing Interventions Intervention Not Indicated  Transportation Interventions Intervention Not Indicated  Utilities Interventions Intervention Not Indicated    Goals Addressed             This Visit's Progress    VBCI Transitions of  Care (TOC) Care Plan       Problems:  Recent Hospitalization for treatment of Pneumonia; Multifocal Infiltrates Hospital or ED Adm Risk is (92%)  Goal:  Over the next 30 days, the patient will not experience hospital readmission  Interventions:   Evaluation of current treatment plan related to Pulmonary Disease,  self-management and patient's adherence to plan as established by provider. Discussed plans with patient for ongoing care management follow up and provided patient with direct contact information for care management team Evaluation of current treatment plan related to Pneumonia and patient's adherence to plan as established by provider Provided education to patient re: Pneumonia, long term effects, include light activity into daily routine Reviewed medications with patient and discussed Prednisone  and the effect it has on blood sugar Reviewed scheduled/upcoming provider appointments including PCP appointment on 06/27/24 Discussed plans with patient for ongoing care management follow up and provided patient with direct contact information for care management team Screening for signs and symptoms of depression related to  chronic disease state  Take rest breaks as needed when performing chores, ADL's Oxygen  2l/min continuous  Patient Self Care Activities:  Attend all scheduled provider appointments Call pharmacy for medication refills 3-7 days in advance of running out of medications Call provider office for new concerns or questions  Notify RN Care Manager of [facility] call rescheduling needs Participate in Transition of Care Program/Attend [facility] scheduled calls Perform all self care activities independently  Take medications as prescribed    Plan:  Telephone follow up appointment with care management team member scheduled for:  Tuesday January 27th at 11:30am        Medford Balboa, BSN, RN [place]  VBCI - Population Health RN Care Manager [phone]  "

## 2024-06-17 NOTE — Patient Instructions (Signed)
 Visit Information  Thank you for taking time to visit with me today. Please don't hesitate to contact me if I can be of assistance to you before our next scheduled telephone appointment.  Our next appointment is by telephone on Tuesday January 27th at 11:30am  Following is a copy of your care plan:   Goals Addressed             This Visit's Progress    VBCI Transitions of Care (TOC) Care Plan       Problems:  Recent Hospitalization for treatment of Pneumonia; Multifocal Infiltrates Hospital or ED Adm Risk is (92%)  Goal:  Over the next 30 days, the patient will not experience hospital readmission  Interventions:   Evaluation of current treatment plan related to Pulmonary Disease,  self-management and patient's adherence to plan as established by provider. Discussed plans with patient for ongoing care management follow up and provided patient with direct contact information for care management team Evaluation of current treatment plan related to Pneumonia and patient's adherence to plan as established by provider Provided education to patient re: Pneumonia, long term effects, include light activity into daily routine Reviewed medications with patient and discussed Prednisone  and the effect it has on blood sugar Reviewed scheduled/upcoming provider appointments including PCP appointment on 06/27/24 Discussed plans with patient for ongoing care management follow up and provided patient with direct contact information for care management team Screening for signs and symptoms of depression related to chronic disease state  Take rest breaks as needed when performing chores, ADL's Oxygen  2l/min continuous  Patient Self Care Activities:  Attend all scheduled provider appointments Call pharmacy for medication refills 3-7 days in advance of running out of medications Call provider office for new concerns or questions  Notify RN Care Manager of [facility] call rescheduling needs Participate in  Transition of Care Program/Attend [facility] scheduled calls Perform all self care activities independently  Take medications as prescribed    Plan:  Telephone follow up appointment with care management team member scheduled for:  Tuesday January 27th at 11:30am        Patient verbalizes understanding of instructions and care plan provided today and agrees to view in MyChart. Active MyChart status and patient understanding of how to access instructions and care plan via MyChart confirmed with patient.     The patient has been provided with contact information for the care management team and has been advised to call with any health related questions or concerns.   Please call the care guide team at [phone] if you need to cancel or reschedule your appointment.   Please call the Suicide and Crisis Lifeline: 988 call the [place]  National Suicide Prevention Lifeline: 4693217748 or TTY: 458-575-0123 TTY (440)727-6641) to talk to a trained counselor if you are experiencing a Mental Health or Behavioral Health Crisis or need someone to talk to.  Medford Balboa, BSN, RN [place]  VBCI - [facility] [employer] [facility] Health RN Care Manager [phone]

## 2024-06-25 ENCOUNTER — Other Ambulatory Visit: Payer: Self-pay

## 2024-06-25 NOTE — Patient Instructions (Signed)
 Visit Information  Thank you for taking time to visit with me today. Please don't hesitate to contact me if I can be of assistance to you before our next scheduled telephone appointment.  Our next appointment is by telephone on Wednesday February 4th at 10:30am  Following is a copy of your care plan:   Goals Addressed             This Visit's Progress    VBCI Transitions of Care (TOC) Care Plan       Problems: (reviewed 06/25/24) Recent Hospitalization for treatment of Pneumonia; Multifocal Infiltrates Hospital or ED Adm Risk is (92%)  Goal: (reviewed 06/25/24) Over the next 30 days, the patient will not experience hospital readmission  Interventions: (reviewed 06/25/24)  Evaluation of current treatment plan related to Pulmonary Disease,  self-management and patient's adherence to plan as established by provider. Discussed plans with patient for ongoing care management follow up and provided patient with direct contact information for care management team Evaluation of current treatment plan related to Pneumonia and patient's adherence to plan as established by provider Provided education to patient re: Pneumonia, long term effects, include light activity into daily routine Reviewed medications with patient and discussed Prednisone  and the effect it has on blood sugar Reviewed scheduled/upcoming provider appointments including PCP appointment on 06/27/24 Discussed plans with patient for ongoing care management follow up and provided patient with direct contact information for care management team Screening for signs and symptoms of depression related to chronic disease state  Take rest breaks as needed when performing chores, ADL's Oxygen  2l/min continuous 06/25/24 - Maintain sats above 90%. Prednisone  taper completed.  06/25/24 - Use Nebulizer three times a day as needed  Patient Self Care Activities: (reviewed 06/25/24) Attend all scheduled provider appointments Call pharmacy for  medication refills 3-7 days in advance of running out of medications Call provider office for new concerns or questions  Notify RN Care Manager of [facility] call rescheduling needs Participate in Transition of Care Program/Attend [facility] scheduled calls Perform all self care activities independently  Take medications as prescribed    Plan:  Telephone follow up appointment with care management team member scheduled for:  Wednesday February 4th at 10:30am        Patient verbalizes understanding of instructions and care plan provided today and agrees to view in MyChart. Active MyChart status and patient understanding of how to access instructions and care plan via MyChart confirmed with patient.     The patient has been provided with contact information for the care management team and has been advised to call with any health related questions or concerns.   Please call the care guide team at [phone] if you need to cancel or reschedule your appointment.   Please call the Suicide and Crisis Lifeline: 988 call the [place]  National Suicide Prevention Lifeline: 807 829 2662 or TTY: (613)649-3448 TTY 708-863-9355) to talk to a trained counselor if you are experiencing a Mental Health or Behavioral Health Crisis or need someone to talk to.  Medford Balboa, BSN, RN [place]  VBCI - [employer] Health RN Care Manager [phone]

## 2024-06-25 NOTE — Transitions of Care (Post Inpatient/ED Visit) (Signed)
 " Transition of Care week 2  Visit Note  06/25/2024  Name: [name] MRN: [id number]          DOB: 1941-01-16  Situation: Patient enrolled in [name] 30-day program. Visit completed with [name] by telephone.   Background:   Initial Transition Care Management Follow-up Telephone Call Discharge Date and Diagnosis: 06/16/24, Pneumonia   Past Medical History:  Diagnosis Date   Anemia    Arthritis    Asthma    adult onset   Basal cell cancer    LUE; Porokeratosis also   Chronic kidney disease 1963   strep in kidney due to strep throat-hospitalized 10 days   Complication of anesthesia    small trachea   Diabetes mellitus 2010   A1c 6.7%   DVT (deep venous thrombosis) (HCC) 2006   post immobilization post cns surgery   GERD (gastroesophageal reflux disease)    very mild   Granulomatous lung disease (HCC) 2002   incidental Xray finding   Hyperlipidemia    Hypertension 2004   Hypertensive response on Stress Test   Paralysis (HCC) 2006   post cervical fusion with spinal sac tear  with hematoma    PTE (pulmonary thromboembolism) (HCC) 2006    Assessment: Patient Reported Symptoms: Cognitive Cognitive Status: Alert and oriented to person, place, and time, Normal speech and language skills      Neurological Neurological Review of Symptoms: No symptoms reported    HEENT HEENT Symptoms Reported: No symptoms reported      Cardiovascular Cardiovascular Symptoms Reported: No symptoms reported    Respiratory Respiratory Symptoms Reported: Productive cough Additional Respiratory Details: The patient continues with oxygen . She states her sats drop to 89% with exertion when she doesn't wear it. She is using her nebulizers three times a day. She is coughing up sputum but it is clear Respiratory Management Strategies: Oxygen  therapy, Adequate rest, Pulmonary rehab, Breathing exercise  Endocrine Endocrine Symptoms Reported: Not assessed    Gastrointestinal Gastrointestinal Symptoms  Reported: Not assessed      Genitourinary Genitourinary Symptoms Reported: Not assessed    Integumentary Integumentary Symptoms Reported: Not assessed    Musculoskeletal Musculoskelatal Symptoms Reviewed: Weakness Musculoskeletal Management Strategies: Adequate rest Musculoskeletal Comment: The patient has not started her HHPT      Psychosocial Psychosocial Symptoms Reported: Not assessed         There were no vitals filed for this visit.    Medications Reviewed Today     Reviewed by [name], RN (Case Manager) on 06/25/24 at 1133  Med List Status: <None>   Medication Order Taking? Sig Documenting Provider Last Dose Status Informant  Accu-Chek Softclix Lancets lancets [id number]  USE TO TEST BLOOD SUGAR TWICE DAILY AS DIRECTED Lowne Chase, Yvonne R, DO  Active Self, Pharmacy Records  albuterol  (PROVENTIL ) (2.5 MG/3ML) 0.083% nebulizer solution [id number]  Take 3 mLs (2.5 mg total) by nebulization every 6 (six) hours as needed for wheezing or shortness of breath. Neysa Reggy BIRCH, MD  Active Self, Pharmacy Records  albuterol  (VENTOLIN  HFA) [phone] Base) MCG/ACT inhaler [id number]  USE 2 INHALATIONS BY MOUTH  EVERY 4 TO 6 HOURS AS  NEEDED  Patient taking differently: Inhale 2 puffs into the lungs every 4 (four) hours as needed for wheezing or shortness of breath. USE 2 INHALATIONS BY MOUTH  EVERY 4 TO 6 HOURS AS  NEEDED   Neysa Reggy BIRCH, MD  Active Self, Pharmacy Records  amLODipine  (NORVASC ) 2.5 MG tablet [id number]  Take 1  tablet (2.5 mg total) by mouth daily. Cox, Amy N, DO  Active   azelastine  (ASTELIN ) 0.1 % nasal spray [id number]  Use 1-2 puffs in each nostril once or twice daily  Patient taking differently: Place 2 sprays into both nostrils 2 (two) times daily. Use 1-2 puffs in each nostril once or twice daily   Young, Clinton D, MD  Active Self, Pharmacy Records  benzonatate  (TESSALON ) 200 MG capsule [id number]  Take 1 capsule (200 mg total) by mouth 3 (three) times  daily as needed (cough). Cox, Amy N, DO  Active   blood glucose meter kit and supplies KIT [id number]  Dispense based on patient and insurance preference. Use up to four times daily as directed. Antonio Cyndee [name], DO  Active Self, Pharmacy Records  BREO ELLIPTA  100-25 MCG/ACT AEPB [id number]  Inhale 1 puff into the lungs daily.  Patient not taking: Reported on 06/12/2024   Neysa Reggy BIRCH, MD  Active Self, Pharmacy Records  Calcium Citrate-Vitamin D  (CALCIUM + D PO) [id number]  Take 600 mg by mouth in the morning and at bedtime. [provider]  Active Self, Pharmacy Records  celecoxib  (CELEBREX ) 200 MG capsule [id number]  TAKE 1 CAPSULE(200 MG) BY MOUTH DAILY  Patient taking differently: Take 200 mg by mouth daily. TAKE 1 CAPSULE(200 MG) BY MOUTH DAILY   Antonio Cyndee, Yvonne R, DO  Active Self, Pharmacy Records  cetirizine (ZYRTEC) 10 MG tablet [id number]  Take 10 mg by mouth daily as needed for allergies. [provider]  Active Self, Pharmacy Records  cholecalciferol  (VITAMIN D3) 25 MCG (1000 UNIT) tablet [id number]  Take 1,000 Units by mouth daily. [provider]  Active Self, Pharmacy Records  clonazepam  (KLONOPIN ) 0.25 MG disintegrating tablet [id number]  Take 1 tablet (0.25 mg total) by mouth 2 (two) times daily as needed (anxiety). Eldonna Elspeth PARAS, MD  Expired 06/16/24 2359   clotrimazole -betamethasone  (LOTRISONE ) cream [id number]  Apply 1 Application topically daily.  Patient not taking: Reported on 06/12/2024   Antonio Cyndee [name], DO  Active Self, Pharmacy Records  cyclobenzaprine  (FLEXERIL ) 10 MG tablet [id number]  TAKE 1 TABLET BY MOUTH THREE TIMES DAILY AS NEEDED FOR HIP CONCERNS  Patient taking differently: Take 10 mg by mouth 3 (three) times daily as needed for muscle spasms. TAKE 1 TABLET BY MOUTH THREE TIMES DAILY AS NEEDED FOR HIP CONCERNS   Antonio Cyndee [name], DO  Active Self, Pharmacy Records  diclofenac Sodium (VOLTAREN) 1 % GEL [id number]  Apply 2 g  topically daily as needed (Pain). [provider]  Active Self, Pharmacy Records  ezetimibe  (ZETIA ) 10 MG tablet [id number]  Take 1 tablet (10 mg total) by mouth daily. Antonio Cyndee [name], DO  Active Self, Pharmacy Records  fenofibrate  160 MG tablet [id number]  TAKE 1 TABLET(160 MG) BY MOUTH DAILY  Patient taking differently: Take 160 mg by mouth daily.   Mona Vinie BROCKS, MD  Active Self, Pharmacy Records  FEROSUL 325 (65 Fe) MG tablet [id number]  TAKE 1 TABLET(325 MG) BY MOUTH DAILY WITH BREAKFAST  Patient taking differently: Take 325 mg by mouth daily.   Lowne Chase, Yvonne R, DO  Active Self, Pharmacy Records  fluticasone  furoate-vilanterol (BREO ELLIPTA ) 200-25 MCG/ACT AEPB [id number]  Inhale 1 puff into the lungs daily. Antonio Cyndee [name], DO  Active Self, Pharmacy Records  glucose blood test strip [id number]  Use to check sugars twice daily Antonio Cyndee, Yvonne R, DO  Active Self, Pharmacy Records  ipratropium-albuterol  (DUONEB) 0.5-2.5 (  3) MG/3ML SOLN [id number]  Take 3 mLs by nebulization every 6 (six) hours as needed. Cox, Amy N, DO  Active   Lidocaine  4 % PTCH [id number]  Apply 1 patch topically as needed (back pain). [provider]  Active Self, Pharmacy Records  metFORMIN  (GLUCOPHAGE -XR) 500 MG 24 hr tablet [id number]  Take 1 tablet with morning meal, 1 tablet with lunch and 2 tablets with evening meal.  Patient taking differently: Take 500 mg by mouth in the morning, at noon, and at bedtime. Take 1 tablet with morning meal, 1 tablet with lunch and 1 tablet with evening meal.   Antonio Meth, Yvonne R, DO  Active Self, Pharmacy Records  methocarbamol  (ROBAXIN ) 500 MG tablet [id number]  TAKE 1 TABLET(500 MG) BY MOUTH EVERY 6 HOURS AS NEEDED FOR MUSCLE SPASMS  Patient taking differently: Take 1,000 mg by mouth every 6 (six) hours as needed for muscle spasms.   Antonio Meth [name], DO  Active Self, Pharmacy Records  mirabegron  ER (MYRBETRIQ ) 50 MG TB24 tablet [id number]   TAKE 1 TABLET(50 MG) BY MOUTH DAILY  Patient taking differently: Take 50 mg by mouth daily.   Antonio Meth [name], DO  Active Self, Pharmacy Records  Multiple Vitamin (MULTIVITAMIN WITH MINERALS) TABS tablet [id number]  Take 1 tablet by mouth daily. Centrum One a day - 50+ [provider]  Active Self, Pharmacy Records  Multiple Vitamins-Minerals (PRESERVISION AREDS 2+MULTI VIT PO) [id number]  Take 1 tablet by mouth in the morning and at bedtime. [provider]  Active Self, Pharmacy Records  mupirocin  ointment (BACTROBAN ) 2 % [id number]  Apply 1 Application topically 3 (three) times daily.  Patient not taking: Reported on 06/12/2024   Cyndi Shaver, PA-C  Active Self, Pharmacy Records  ondansetron  (ZOFRAN ) 4 MG tablet [id number]  Take 1 tablet (4 mg total) by mouth every 6 (six) hours as needed for nausea. Cox, Amy LOISE, DO  Active   OXYGEN  [id number]  Inhale 2 L/min into the lungs continuous. [provider]  Active   Polyethyl Glycol-Propyl Glycol (SYSTANE OP) [id number]  Place 1 drop into both eyes daily as needed (dry eyes). [provider]  Active Self, Pharmacy Records  polyethylene glycol (MIRALAX  / GLYCOLAX ) packet [id number]  Take 17 g by mouth daily as needed for mild constipation.  [provider]  Active Self, Pharmacy Records  predniSONE  (DELTASONE ) 10 MG tablet [id number] Yes Take 1-4 tablets (10-40 mg total) by mouth See admin instructions. 1/19 and 1/20: take 4 tabs (40 mg total) with breakfast. 1/21: take 3 tabs (30 mg total) with breakfast. 1/22 and 1/23: take 2 tabs (20 mg total) with breakfast. 1/24 and 1/25: take 1 tabs with breakfast. Complete the course. Cox, Amy N, DO  Active   pregabalin  (LYRICA ) 100 MG capsule [id number]  Take 1 capsule (100 mg total) by mouth 3 (three) times daily. Eldonna Elspeth PARAS, MD  Expired 06/16/24 2359   Trolamine Salicylate (ASPERCREME EX) [id number]  Apply 1 application topically daily as needed (back pain).   Patient not taking: Reported on 06/12/2024   [provider]  Active Self, Pharmacy Records  VASCEPA  1 g capsule [id number]  TAKE 2 CAPSULES(2 GRAMS) BY MOUTH TWICE DAILY  Patient taking differently: Take 4 g by mouth 2 (two) times daily. TAKE 2 CAPSULES(2 GRAMS) BY MOUTH TWICE DAILY   Hilty, Vinie BROCKS, MD  Active Self, Pharmacy Records            Recommendation:   Continue Current Plan  of Care  Follow Up Plan:   Telephone follow-up in 1 week  Medford Balboa, BSN, RN [place]  [facility] - [facility] RN Care Manager [phone]     "

## 2024-06-27 ENCOUNTER — Other Ambulatory Visit (HOSPITAL_BASED_OUTPATIENT_CLINIC_OR_DEPARTMENT_OTHER): Payer: Self-pay

## 2024-06-27 ENCOUNTER — Other Ambulatory Visit: Payer: Self-pay

## 2024-06-27 ENCOUNTER — Ambulatory Visit: Admitting: Family Medicine

## 2024-06-27 ENCOUNTER — Encounter: Payer: Self-pay | Admitting: Family Medicine

## 2024-06-27 ENCOUNTER — Ambulatory Visit: Payer: Self-pay | Admitting: Family Medicine

## 2024-06-27 VITALS — BP 138/58 | HR 65 | Temp 98.2°F | Resp 18 | Ht 63.0 in | Wt 150.0 lb

## 2024-06-27 DIAGNOSIS — E1169 Type 2 diabetes mellitus with other specified complication: Secondary | ICD-10-CM | POA: Diagnosis not present

## 2024-06-27 DIAGNOSIS — E1140 Type 2 diabetes mellitus with diabetic neuropathy, unspecified: Secondary | ICD-10-CM | POA: Diagnosis not present

## 2024-06-27 DIAGNOSIS — G8929 Other chronic pain: Secondary | ICD-10-CM | POA: Diagnosis not present

## 2024-06-27 DIAGNOSIS — E785 Hyperlipidemia, unspecified: Secondary | ICD-10-CM | POA: Diagnosis not present

## 2024-06-27 DIAGNOSIS — I10 Essential (primary) hypertension: Secondary | ICD-10-CM | POA: Diagnosis not present

## 2024-06-27 DIAGNOSIS — D509 Iron deficiency anemia, unspecified: Secondary | ICD-10-CM | POA: Diagnosis not present

## 2024-06-27 DIAGNOSIS — J189 Pneumonia, unspecified organism: Secondary | ICD-10-CM

## 2024-06-27 DIAGNOSIS — M545 Low back pain, unspecified: Secondary | ICD-10-CM

## 2024-06-27 LAB — CBC WITH DIFFERENTIAL/PLATELET
Basophils Absolute: 0.1 10*3/uL (ref 0.0–0.1)
Basophils Relative: 0.6 % (ref 0.0–3.0)
Eosinophils Absolute: 0.1 10*3/uL (ref 0.0–0.7)
Eosinophils Relative: 1.5 % (ref 0.0–5.0)
HCT: 34 % — ABNORMAL LOW (ref 36.0–46.0)
Hemoglobin: 11.3 g/dL — ABNORMAL LOW (ref 12.0–15.0)
Lymphocytes Relative: 21.5 % (ref 12.0–46.0)
Lymphs Abs: 1.9 10*3/uL (ref 0.7–4.0)
MCHC: 33.1 g/dL (ref 30.0–36.0)
MCV: 87.8 fl (ref 78.0–100.0)
Monocytes Absolute: 0.8 10*3/uL (ref 0.1–1.0)
Monocytes Relative: 8.6 % (ref 3.0–12.0)
Neutro Abs: 5.9 10*3/uL (ref 1.4–7.7)
Neutrophils Relative %: 67.8 % (ref 43.0–77.0)
Platelets: 231 10*3/uL (ref 150.0–400.0)
RBC: 3.87 Mil/uL (ref 3.87–5.11)
RDW: 15.7 % — ABNORMAL HIGH (ref 11.5–15.5)
WBC: 8.7 10*3/uL (ref 4.0–10.5)

## 2024-06-27 LAB — COMPREHENSIVE METABOLIC PANEL WITH GFR
ALT: 15 U/L (ref 3–35)
AST: 13 U/L (ref 5–37)
Albumin: 3.7 g/dL (ref 3.5–5.2)
Alkaline Phosphatase: 47 U/L (ref 39–117)
BUN: 19 mg/dL (ref 6–23)
CO2: 30 meq/L (ref 19–32)
Calcium: 9.1 mg/dL (ref 8.4–10.5)
Chloride: 103 meq/L (ref 96–112)
Creatinine, Ser: 0.91 mg/dL (ref 0.40–1.20)
GFR: 58.38 mL/min — ABNORMAL LOW
Glucose, Bld: 161 mg/dL — ABNORMAL HIGH (ref 70–99)
Potassium: 4.5 meq/L (ref 3.5–5.1)
Sodium: 138 meq/L (ref 135–145)
Total Bilirubin: 0.6 mg/dL (ref 0.2–1.2)
Total Protein: 5.9 g/dL — ABNORMAL LOW (ref 6.0–8.3)

## 2024-06-27 MED ORDER — IRON (FERROUS SULFATE) 325 (65 FE) MG PO TABS
325.0000 mg | ORAL_TABLET | Freq: Every day | ORAL | 1 refills | Status: AC
  Filled 2024-06-27: qty 90, 90d supply, fill #0

## 2024-06-27 MED ORDER — CELECOXIB 200 MG PO CAPS
200.0000 mg | ORAL_CAPSULE | Freq: Every day | ORAL | 3 refills | Status: AC
  Filled 2024-06-27: qty 90, 90d supply, fill #0

## 2024-06-27 NOTE — Progress Notes (Signed)
 "  Subjective:    Patient ID: [name], female    DOB: 15-Nov-1940, 84 y.o.   MRN: [id number]  Chief Complaint  Patient presents with   Hospitalization Follow-up    HPI Patient is in today for hosp f/u.   Discussed the use of AI scribe software for clinical note transcription with the patient, who gave verbal consent to proceed.  History of Present Illness [name] is an 84 year old female who presents with concerns about her blood pressure and recent hospitalization for pneumonia. She is accompanied by her daughter, Luke.  She was recently hospitalized for pneumonia affecting two lobes of her lungs. She completed a steroid taper two days ago. During her hospital stay, her white blood cell count was elevated, and she was advised to repeat labs to ensure it returns to normal.  She is currently using supplemental oxygen , which she started weaning off during the daytime two days ago. She manages well without it for most of the day, although her oxygen  levels dropped to 89% when walking without it.  She was started on amlodipine  2.5 mg for high blood pressure after experiencing panic attacks and elevated blood pressure during her hospital stay. Her blood pressure was 142/68 this morning, which is often high when measured at the doctor's office. She has a history of anxiety, particularly in hospital settings, which she feels contributed to her elevated blood pressure.  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 Post-surgery, she was informed of her pneumonia diagnosis while at urgent care.  She has a history of porokeratosis, an inherited allergy  to the sun, which is also present in her twin sister's daughters. She has not been in the sun for years.  She is currently taking Celebrex  and furosemide  and is interested in refilling these medications. She has been using a nebulizer three times a day and feels 'almost normal' but  acknowledges some weakness.  Her family history includes the recent loss of her twin sister and older sister, both of whom had significant health issues, including diabetes and heart problems. Her twin sister passed away in Apr 10, 2025, shortly after the death of her older sister in 2025-03-10.  No current use of antibiotics. She feels good overall. She acknowledges some weakness but no major respiratory distress.    Past Medical History:  Diagnosis Date   Anemia    Arthritis    Asthma    adult onset   Basal cell cancer    LUE; Porokeratosis also   Chronic kidney disease 1963   strep in kidney due to strep throat-hospitalized 10 days   Complication of anesthesia    small trachea   Diabetes mellitus 2010   A1c 6.7%   DVT (deep venous thrombosis) (HCC) 2006   post immobilization post cns surgery   GERD (gastroesophageal reflux disease)    very mild   Granulomatous lung disease (HCC) 2002   incidental Xray finding   Hyperlipidemia    Hypertension 2004   Hypertensive response on Stress Test   Paralysis (HCC) 2006   post cervical fusion with spinal sac tear  with hematoma    PTE (pulmonary thromboembolism) (HCC) 2006    Past Surgical History:  Procedure Laterality Date   ABDOMINAL EXPOSURE N/A 08/12/2016   Procedure: ABDOMINAL EXPOSURE;  Surgeon: Krystal JULIANNA Doing, MD;  Location[facility] OR;  Service: Vascular;  Laterality: N/A;   ANTERIOR LAT LUMBAR FUSION N/A 08/12/2016  Procedure: LUMBAR TWO-THREE, LUMBAR THREE-FOUR, LUMBAR FOUR-FIVE  ANTEROLATERAL LUMBAR INTERBODY FUSION;  Surgeon: Fairy Levels, MD;  Location: [facility] OR;  Service: Neurosurgery;  Laterality: N/A;  L2-3 L3-4 L4-5 Anterolateral lumbar interbody fusion   ANTERIOR LUMBAR FUSION N/A 08/12/2016   Procedure: Lumbar Five-Sacral One Anterior lumbar interbody fusion with Dr. Krystal Early to assist;  Surgeon: Fairy Levels, MD;  Location: [facility] OR;  Service: Neurosurgery;  Laterality: N/A;  L5-S1 Anterior lumbar interbody fusion with Dr. Krystal  Early to assist   BUNIONECTOMY     CATARACT EXTRACTION Right 12/31/2018   CATARACT EXTRACTION Left 12/17/2018   CERVICAL FUSION  2006   Dr Louis, NS;post op hematoma & cns leak & urinary retention   COLONOSCOPY  1992 & 2002   negative   epidural steroids  2006   cervical spine   LUMBAR PERCUTANEOUS PEDICLE SCREW 4 LEVEL N/A 08/12/2016   Procedure: LUMBAR TWO-SACRAL ONE Percuataneous Pedicle Screws;  Surgeon: Fairy Levels, MD;  Location: [facility] OR;  Service: Neurosurgery;  Laterality: N/A;   ROTATOR CUFF REPAIR  2009   Right   SEPTOPLASTY     TONSILLECTOMY  84 years old   TOTAL HIP ARTHROPLASTY  06/01/2012   Procedure: TOTAL HIP ARTHROPLASTY ANTERIOR APPROACH;  Surgeon: Lonni CINDERELLA Poli, MD;  Location: [facility] ORS;  Service: Orthopedics;  Laterality: Left;  Left Total Hip Arthroplasty   TOTAL HIP ARTHROPLASTY Right 02/23/2018   Procedure: RIGHT TOTAL HIP ARTHROPLASTY ANTERIOR APPROACH;  Surgeon: Poli Lonni CINDERELLA, MD;  Location: [facility] ORS;  Service: Orthopedics;  Laterality: Right;   TUBAL LIGATION      Family History  Problem Relation Age of Onset   Cancer Mother        cns cancer   COPD Father        emphysema   Diabetes Sister        TWIN sister ; also Fibromyalgia ; S/P stent 2004   Heart disease Sister        stents @ 25 & 29   Stroke Maternal Grandmother        in  late 64s   Transient ischemic attack Paternal Aunt     Social History   Socioeconomic History   Marital status: Divorced    Spouse name: Not on file   Number of children: Not on file   Years of education: Not on file   Highest education level: Not on file  Occupational History   Not on file  Tobacco Use   Smoking status: Never    Passive exposure: Past   Smokeless tobacco: Never  Vaping Use   Vaping status: Never Used  Substance and Sexual Activity   Alcohol use: Yes    Alcohol/week: 1.0 standard drink of alcohol    Types: 1 Glasses of wine per week    Comment: socially   Drug use: No   Sexual  activity: Never  Other Topics Concern   Not on file  Social History Narrative   Not on file   Social Drivers of Health   Tobacco Use: Low Risk (06/27/2024)   Patient History    Smoking Tobacco Use: Never    Smokeless Tobacco Use: Never    Passive Exposure: Past  Financial Resource Strain: Low Risk (10/29/2021)   Overall Financial Resource Strain (CARDIA)    Difficulty of Paying Living Expenses: Not hard at all  Food Insecurity: No Food Insecurity (06/17/2024)   Epic    Worried About Radiation Protection Practitioner of Food in the Last Year:  Never true    Ran Out of Food in the Last Year: Never true  Transportation Needs: No Transportation Needs (06/17/2024)   Epic    Lack of Transportation (Medical): No    Lack of Transportation (Non-Medical): No  Physical Activity: Sufficiently Active (04/11/2024)   Exercise Vital Sign    Days of Exercise per Week: 3 days    Minutes of Exercise per Session: 60 min  Stress: No Stress Concern Present (04/11/2024)   [employer] of Occupational Health - Occupational Stress Questionnaire    Feeling of Stress: Not at all  Social Connections: Moderately Integrated (06/12/2024)   Social Connection and Isolation Panel    Frequency of Communication with Friends and Family: More than three times a week    Frequency of Social Gatherings with Friends and Family: More than three times a week    Attends Religious Services: 1 to 4 times per year    Active Member of Clubs or Organizations: Yes    Attends Banker Meetings: More than 4 times per year    Marital Status: Divorced  Intimate Partner Violence: Not At Risk (06/17/2024)   Epic    Fear of Current or Ex-Partner: No    Emotionally Abused: No    Physically Abused: No    Sexually Abused: No  Depression (PHQ2-9): Low Risk (04/11/2024)   Depression (PHQ2-9)    PHQ-2 Score: 0  Alcohol Screen: Low Risk (07/14/2022)   Alcohol Screen    Last Alcohol Screening Score (AUDIT): 3  Housing: Unknown (06/17/2024)    Epic    Unable to Pay for Housing in the Last Year: No    Number of Times Moved in the Last Year: Not on file    Homeless in the Last Year: No  Utilities: Not At Risk (06/17/2024)   Epic    Threatened with loss of utilities: No  Health Literacy: Adequate Health Literacy (04/11/2024)   B1300 Health Literacy    Frequency of need for help with medical instructions: Never    Outpatient Medications Prior to Visit  Medication Sig Dispense Refill   Accu-Chek Softclix Lancets lancets USE TO TEST BLOOD SUGAR TWICE DAILY AS DIRECTED 100 each 12   albuterol  (PROVENTIL ) (2.5 MG/3ML) 0.083% nebulizer solution Take 3 mLs (2.5 mg total) by nebulization every 6 (six) hours as needed for wheezing or shortness of breath. 75 mL 12   albuterol  (VENTOLIN  HFA) 108 (90 Base) MCG/ACT inhaler USE 2 INHALATIONS BY MOUTH  EVERY 4 TO 6 HOURS AS  NEEDED (Patient taking differently: Inhale 2 puffs into the lungs every 4 (four) hours as needed for wheezing or shortness of breath. USE 2 INHALATIONS BY MOUTH  EVERY 4 TO 6 HOURS AS  NEEDED) 51 g 3   amLODipine  (NORVASC ) 2.5 MG tablet Take 1 tablet (2.5 mg total) by mouth daily. 30 tablet 1   azelastine  (ASTELIN ) 0.1 % nasal spray Use 1-2 puffs in each nostril once or twice daily (Patient taking differently: Place 2 sprays into both nostrils 2 (two) times daily. Use 1-2 puffs in each nostril once or twice daily) 30 mL 12   benzonatate  (TESSALON ) 200 MG capsule Take 1 capsule (200 mg total) by mouth 3 (three) times daily as needed (cough). 20 capsule 0   blood glucose meter kit and supplies KIT Dispense based on patient and insurance preference. Use up to four times daily as directed. 1 each 0   Calcium Citrate-Vitamin D  (CALCIUM + D PO) Take 600 mg by mouth  in the morning and at bedtime.     cetirizine (ZYRTEC) 10 MG tablet Take 10 mg by mouth daily as needed for allergies.     cholecalciferol  (VITAMIN D3) 25 MCG (1000 UNIT) tablet Take 1,000 Units by mouth daily.      clonazepam  (KLONOPIN ) 0.25 MG disintegrating tablet Take 1 tablet (0.25 mg total) by mouth 2 (two) times daily as needed (anxiety). 4 tablet 0   clotrimazole -betamethasone  (LOTRISONE ) cream Apply 1 Application topically daily. 30 g 0   cyclobenzaprine  (FLEXERIL ) 10 MG tablet TAKE 1 TABLET BY MOUTH THREE TIMES DAILY AS NEEDED FOR HIP CONCERNS (Patient taking differently: Take 10 mg by mouth 3 (three) times daily as needed for muscle spasms. TAKE 1 TABLET BY MOUTH THREE TIMES DAILY AS NEEDED FOR HIP CONCERNS) 30 tablet 2   diclofenac Sodium (VOLTAREN) 1 % GEL Apply 2 g topically daily as needed (Pain).     ezetimibe  (ZETIA ) 10 MG tablet Take 1 tablet (10 mg total) by mouth daily. 90 tablet 1   fenofibrate  160 MG tablet TAKE 1 TABLET(160 MG) BY MOUTH DAILY (Patient taking differently: Take 160 mg by mouth daily.) 90 tablet 2   FEROSUL 325 (65 Fe) MG tablet TAKE 1 TABLET(325 MG) BY MOUTH DAILY WITH BREAKFAST (Patient taking differently: Take 325 mg by mouth daily.) 90 tablet 1   fluticasone  furoate-vilanterol (BREO ELLIPTA ) 200-25 MCG/ACT AEPB Inhale 1 puff into the lungs daily. 1 each 11   glucose blood test strip Use to check sugars twice daily 100 each 12   ipratropium-albuterol  (DUONEB) 0.5-2.5 (3) MG/3ML SOLN Take 3 mLs by nebulization every 6 (six) hours as needed. 180 mL 1   Lidocaine  4 % PTCH Apply 1 patch topically as needed (back pain).     metFORMIN  (GLUCOPHAGE -XR) 500 MG 24 hr tablet Take 1 tablet with morning meal, 1 tablet with lunch and 2 tablets with evening meal. (Patient taking differently: Take 500 mg by mouth in the morning, at noon, and at bedtime. Take 1 tablet with morning meal, 1 tablet with lunch and 1 tablet with evening meal.) 360 tablet 3   methocarbamol  (ROBAXIN ) 500 MG tablet TAKE 1 TABLET(500 MG) BY MOUTH EVERY 6 HOURS AS NEEDED FOR MUSCLE SPASMS (Patient taking differently: Take 1,000 mg by mouth every 6 (six) hours as needed for muscle spasms.) 45 tablet 1   mirabegron  ER  (MYRBETRIQ ) 50 MG TB24 tablet TAKE 1 TABLET(50 MG) BY MOUTH DAILY (Patient taking differently: Take 50 mg by mouth daily.) 90 tablet 3   Multiple Vitamin (MULTIVITAMIN WITH MINERALS) TABS tablet Take 1 tablet by mouth daily. Centrum One a day - 50+     Multiple Vitamins-Minerals (PRESERVISION AREDS 2+MULTI VIT PO) Take 1 tablet by mouth in the morning and at bedtime.     ondansetron  (ZOFRAN ) 4 MG tablet Take 1 tablet (4 mg total) by mouth every 6 (six) hours as needed for nausea. 20 tablet 0   OXYGEN  Inhale 2 L/min into the lungs continuous.     Polyethyl Glycol-Propyl Glycol (SYSTANE OP) Place 1 drop into both eyes daily as needed (dry eyes).     polyethylene glycol (MIRALAX  / GLYCOLAX ) packet Take 17 g by mouth daily as needed for mild constipation.      pregabalin  (LYRICA ) 100 MG capsule Take 1 capsule (100 mg total) by mouth 3 (three) times daily. 6 capsule 1   VASCEPA  1 g capsule TAKE 2 CAPSULES(2 GRAMS) BY MOUTH TWICE DAILY (Patient taking differently: Take 4 g by mouth  2 (two) times daily. TAKE 2 CAPSULES(2 GRAMS) BY MOUTH TWICE DAILY) 360 capsule 3   celecoxib  (CELEBREX ) 200 MG capsule TAKE 1 CAPSULE(200 MG) BY MOUTH DAILY (Patient taking differently: Take 200 mg by mouth daily. TAKE 1 CAPSULE(200 MG) BY MOUTH DAILY) 90 capsule 3   predniSONE  (DELTASONE ) 10 MG tablet Take 1-4 tablets (10-40 mg total) by mouth See admin instructions. 1/19 and 1/20: take 4 tabs (40 mg total) with breakfast. 1/21: take 3 tabs (30 mg total) with breakfast. 1/22 and 1/23: take 2 tabs (20 mg total) with breakfast. 1/24 and 1/25: take 1 tabs with breakfast. Complete the course. 17 tablet 0   BREO ELLIPTA  100-25 MCG/ACT AEPB Inhale 1 puff into the lungs daily. (Patient not taking: Reported on 06/27/2024) 60 each 3   mupirocin  ointment (BACTROBAN ) 2 % Apply 1 Application topically 3 (three) times daily. (Patient not taking: Reported on 06/27/2024) 30 g 3   Trolamine Salicylate (ASPERCREME EX) Apply 1 application  topically daily as needed (back pain). (Patient not taking: Reported on 06/27/2024)     No facility-administered medications prior to visit.    Allergies  Allergen Reactions   Statins Other (See Comments)    Myalgias and muscle weakness   Jardiance  [Empagliflozin ] Rash    Review of Systems  Constitutional:  Negative for chills, fever and malaise/fatigue.  HENT:  Negative for congestion and hearing loss.   Eyes:  Negative for discharge.  Respiratory:  Negative for cough, sputum production and shortness of breath.   Cardiovascular:  Negative for chest pain, palpitations and leg swelling.  Gastrointestinal:  Negative for abdominal pain, blood in stool, constipation, diarrhea, heartburn, nausea and vomiting.  Genitourinary:  Negative for dysuria, frequency, hematuria and urgency.  Musculoskeletal:  Negative for back pain, falls and myalgias.  Skin:  Negative for rash.  Neurological:  Negative for dizziness, sensory change, loss of consciousness, weakness and headaches.  Endo/Heme/Allergies:  Negative for environmental allergies. Does not bruise/bleed easily.  Psychiatric/Behavioral:  Negative for depression and suicidal ideas. The patient is not nervous/anxious and does not have insomnia.        Objective:    Physical Exam Vitals and nursing note reviewed.  Constitutional:      General: She is not in acute distress.    Appearance: Normal appearance. She is well-developed.  HENT:     Head: Normocephalic and atraumatic.  Eyes:     General: No scleral icterus.       Right eye: No discharge.        Left eye: No discharge.  Cardiovascular:     Rate and Rhythm: Normal rate and regular rhythm.     Heart sounds: Murmur heard.  Pulmonary:     Effort: Pulmonary effort is normal. No respiratory distress.     Breath sounds: Normal breath sounds.  Musculoskeletal:        General: Normal range of motion.     Cervical back: Normal range of motion and neck supple.     Right lower leg: No  edema.     Left lower leg: No edema.  Skin:    General: Skin is warm and dry.  Neurological:     Mental Status: She is alert and oriented to person, place, and time.  Psychiatric:        Mood and Affect: Mood normal.        Behavior: Behavior normal.        Thought Content: Thought content normal.  Judgment: Judgment normal.     BP (!) 138/58 (BP Location: Left Arm, Patient Position: Sitting, Cuff Size: Large)   Pulse 65   Temp 98.2 F (36.8 C) (Oral)   Resp 18   Ht 5' 3 (1.6 m)   Wt 150 lb (68 kg)   SpO2 97%   BMI 26.57 kg/m  Wt Readings from Last 3 Encounters:  06/27/24 150 lb (68 kg)  06/17/24 155 lb (70.3 kg)  06/12/24 155 lb (70.3 kg)    Diabetic Foot Exam - Simple   No data filed    Lab Results  Component Value Date   WBC 11.7 (H) 06/15/2024   HGB 12.1 06/15/2024   HCT 34.0 (L) 06/15/2024   PLT 222 06/15/2024   GLUCOSE 146 (H) 06/15/2024   CHOL 149 03/04/2024   TRIG 105.0 03/04/2024   HDL 55.00 03/04/2024   LDLDIRECT 54 07/15/2021   LDLCALC 73 03/04/2024   ALT 32 06/15/2024   AST 31 06/15/2024   NA 137 06/15/2024   K 4.7 06/15/2024   CL 102 06/15/2024   CREATININE 0.92 06/15/2024   BUN 24 (H) 06/15/2024   CO2 22 06/15/2024   TSH 5.12 03/04/2024   INR 1.1 01/15/2022   HGBA1C 8.3 (H) 03/04/2024   MICROALBUR 1.9 03/04/2024    Lab Results  Component Value Date   TSH 5.12 03/04/2024   Lab Results  Component Value Date   WBC 11.7 (H) 06/15/2024   HGB 12.1 06/15/2024   HCT 34.0 (L) 06/15/2024   MCV 95.8 06/15/2024   PLT 222 06/15/2024   Lab Results  Component Value Date   NA 137 06/15/2024   K 4.7 06/15/2024   CO2 22 06/15/2024   GLUCOSE 146 (H) 06/15/2024   BUN 24 (H) 06/15/2024   CREATININE 0.92 06/15/2024   BILITOT 0.4 06/15/2024   ALKPHOS 70 06/15/2024   AST 31 06/15/2024   ALT 32 06/15/2024   PROT 6.9 06/15/2024   ALBUMIN 3.6 06/15/2024   CALCIUM 9.7 06/15/2024   ANIONGAP 13 06/15/2024   GFR 54.87 (L) 03/04/2024    Lab Results  Component Value Date   CHOL 149 03/04/2024   Lab Results  Component Value Date   HDL 55.00 03/04/2024   Lab Results  Component Value Date   LDLCALC 73 03/04/2024   Lab Results  Component Value Date   TRIG 105.0 03/04/2024   Lab Results  Component Value Date   CHOLHDL 3 03/04/2024   Lab Results  Component Value Date   HGBA1C 8.3 (H) 03/04/2024       Assessment & Plan:  Pneumonia of both lungs due to infectious organism, unspecified part of lung -     CBC with Differential/Platelet -     Comprehensive metabolic panel with GFR  Chronic left-sided low back pain without sciatica -     Celecoxib ; Take 1 capsule (200 mg total) by mouth daily.  Dispense: 90 capsule; Refill: 3  Iron  deficiency anemia, unspecified iron  deficiency anemia type -     Iron  (Ferrous Sulfate ); Take 325 mg by mouth daily.  Dispense: 90 tablet; Refill: 1 -     CBC with Differential/Platelet  Hyperlipidemia associated with type 2 diabetes mellitus (HCC) -     Comprehensive metabolic panel with GFR  Type 2 diabetes mellitus with diabetic neuropathy, unspecified whether long term insulin  use (HCC)  Essential hypertension Assessment & Plan: Well controlled, no changes to meds. Encouraged heart healthy diet such as the DASH diet and exercise as  tolerated.     Assessment and Plan Assessment & Plan Pneumonia   She was recently hospitalized for pneumonia affecting two lobes and completed a steroid taper (prednisone ) two days ago. Currently, she is weaning off oxygen  therapy with symptom improvement. Her oxygen  saturation was 89% during a walk without oxygen  but has been normal since. No antibiotics are needed. Continue weaning off oxygen  therapy and monitor oxygen  saturation during activities. Use a nebulizer three times a day as needed.  Essential hypertension   Her blood pressure was elevated during the recent hospitalization, likely due to stress and panic attacks. The current blood  pressure is 138/58, which is acceptable. Amlodipine  2.5 mg was started in the hospital but was not taken this morning. The decision is to hold the medication and monitor blood pressure for a couple of weeks. Monitor blood pressure at home, especially at rest. Reassess amlodipine  in a couple of weeks.  Iron  deficiency anemia   She requires a refill of iron  supplementation. The prescription for iron  supplementation has been refilled.    Adylene Dlugosz R Lowne Chase, DO  "

## 2024-06-27 NOTE — Assessment & Plan Note (Signed)
 Well controlled, no changes to meds. Encouraged heart healthy diet such as the DASH diet and exercise as tolerated.

## 2024-07-02 ENCOUNTER — Other Ambulatory Visit (HOSPITAL_BASED_OUTPATIENT_CLINIC_OR_DEPARTMENT_OTHER): Payer: Self-pay

## 2024-07-03 ENCOUNTER — Ambulatory Visit

## 2024-07-03 ENCOUNTER — Other Ambulatory Visit (HOSPITAL_BASED_OUTPATIENT_CLINIC_OR_DEPARTMENT_OTHER): Payer: Self-pay

## 2024-07-03 ENCOUNTER — Telehealth (HOSPITAL_BASED_OUTPATIENT_CLINIC_OR_DEPARTMENT_OTHER): Payer: Self-pay

## 2024-07-03 ENCOUNTER — Other Ambulatory Visit: Payer: Self-pay

## 2024-07-03 VITALS — BP 132/70 | HR 98 | Temp 98.3°F

## 2024-07-03 DIAGNOSIS — J189 Pneumonia, unspecified organism: Secondary | ICD-10-CM | POA: Diagnosis not present

## 2024-07-03 DIAGNOSIS — J4541 Moderate persistent asthma with (acute) exacerbation: Secondary | ICD-10-CM | POA: Diagnosis not present

## 2024-07-03 MED ORDER — FLUTICASONE FUROATE-VILANTEROL 200-25 MCG/ACT IN AEPB
1.0000 | INHALATION_SPRAY | Freq: Every day | RESPIRATORY_TRACT | 11 refills | Status: AC
  Filled 2024-07-03: qty 60, 30d supply, fill #0

## 2024-07-03 MED ORDER — ALBUTEROL SULFATE (2.5 MG/3ML) 0.083% IN NEBU
2.5000 mg | INHALATION_SOLUTION | Freq: Four times a day (QID) | RESPIRATORY_TRACT | 0 refills | Status: AC | PRN
  Filled 2024-07-03: qty 225, 19d supply, fill #0

## 2024-07-03 MED ORDER — ALBUTEROL SULFATE HFA 108 (90 BASE) MCG/ACT IN AERS
2.0000 | INHALATION_SPRAY | RESPIRATORY_TRACT | 6 refills | Status: AC | PRN
  Filled 2024-07-03: qty 6.7, 16d supply, fill #0

## 2024-07-03 NOTE — Telephone Encounter (Signed)
 Pt was placed on oxygen  in Hospital and now wishes to see if she can qualify for POC appt made for pt for today NF[facility]

## 2024-07-03 NOTE — Telephone Encounter (Unsigned)
 Copied from CRM [id number]. Topic: Clinical - Order For Equipment >> Jul 03, 2024  9:09 AM Joesph PARAS wrote: Reason for CRM: Patient is calling to request a portable oxygen  concentrator through [employer]. States is on oxygen  and does not know how long she will be on it. Patient wants a POC instead of toting a cylinder. Please advise.   Patient additionally displeased that scheduling CRM has not been read or addressed as of 07/03/2024. CRM sent 07/01/2024 around 4 PM.

## 2024-07-03 NOTE — Patient Instructions (Signed)
 Visit Information  Thank you for taking time to visit with me today. Please don't hesitate to contact me if I can be of assistance to you before our next scheduled telephone appointment.  Our next appointment is by telephone on Wednesday February 11th at 10:30am  Following is a copy of your care plan:   Goals Addressed             This Visit's Progress    VBCI Transitions of Care (TOC) Care Plan       Problems: (reviewed 06/25/24) Recent Hospitalization for treatment of Pneumonia; Multifocal Infiltrates Hospital or ED Adm Risk is (92%)  Goal: (reviewed 07/03/24) Over the next 30 days, the patient will not experience hospital readmission  Interventions: (reviewed 07/03/24)  Evaluation of current treatment plan related to Pulmonary Disease,  self-management and patient's adherence to plan as established by provider. Discussed plans with patient for ongoing care management follow up and provided patient with direct contact information for care management team Evaluation of current treatment plan related to Pneumonia and patient's adherence to plan as established by provider Provided education to patient re: Pneumonia, long term effects, include light activity into daily routine Reviewed medications with patient and discussed Prednisone  and the effect it has on blood sugar Reviewed scheduled/upcoming provider appointments including PCP appointment on 06/27/24 Discussed plans with patient for ongoing care management follow up and provided patient with direct contact information for care management team Screening for signs and symptoms of depression related to chronic disease state  Take rest breaks as needed when performing chores, ADL's Oxygen  2l/min continuous 06/25/24 - Maintain sats above 90%. Prednisone  taper completed.  06/25/24 - Use Nebulizer three times a day as needed 07/03/24 - Stop Amlodipine . Take as needed. Monitor BP at home daily. Call provider for systolic over 140.   Patient  Self Care Activities: (reviewed 07/03/24) Attend all scheduled provider appointments Call pharmacy for medication refills 3-7 days in advance of running out of medications Call provider office for new concerns or questions  Notify RN Care Manager of [facility] call rescheduling needs Participate in Transition of Care Program/Attend [facility] scheduled calls Perform all self care activities independently  Take medications as prescribed    Plan:  Telephone follow up appointment with care management team member scheduled for:  Wednesday February11th at 10:30am        Patient verbalizes understanding of instructions and care plan provided today and agrees to view in MyChart. Active MyChart status and patient understanding of how to access instructions and care plan via MyChart confirmed with patient.     The patient has been provided with contact information for the care management team and has been advised to call with any health related questions or concerns.   Please call the care guide team at [phone] if you need to cancel or reschedule your appointment.   Please call the Suicide and Crisis Lifeline: 988 call the [place]  National Suicide Prevention Lifeline: 231 872 5896 or TTY: 432-351-7134 TTY 669-430-7838) to talk to a trained counselor if you are experiencing a Mental Health or Behavioral Health Crisis or need someone to talk to.  Medford Balboa, BSN, RN   VBCI - [employer] [facility] RN Care Manager [phone]

## 2024-07-03 NOTE — Patient Instructions (Addendum)
 Dear [name];   Given your recent admission for pneumonia, and your history of asthma I recommend the following:  -Inhalers: Daily inhaler:  Breo 200, 1 puff daily .Rinse your mouth after each use. Inhaler as need it for shortness of breath, wheezing, and rescue therapy: albuterol  1-2 puffs every 4-6 hours as need it.  -Nebulizer: This is for rescue use as need it for severe shortness of breath, wheezing: albuterol ; nebulizer 2-3 times a day as need it  Your walking test did not show you need oxygen . The lowest was 93%. I will recommend to check your oxygen  saturation at room air (without using oxygen ), if it is <88%, you may may need it for heavy exercise. However you can be at room air for your daily activities.   I will see you in 3 months around May. SABRA

## 2024-07-03 NOTE — Transitions of Care (Post Inpatient/ED Visit) (Signed)
 " Transition of Care week 3  Visit Note  07/03/2024  Name: [name] MRN: [id number]          DOB: 1941/05/23  Situation: Patient enrolled in [facility] 30-day program. Visit completed with Hilliary Araque by telephone.   Background:   Initial Transition Care Management Follow-up Telephone Call Discharge Date and Diagnosis: 06/16/24, Pneumonia   Past Medical History:  Diagnosis Date   Anemia    Arthritis    Asthma    adult onset   Basal cell cancer    LUE; Porokeratosis also   Chronic kidney disease 1963   strep in kidney due to strep throat-hospitalized 10 days   Complication of anesthesia    small trachea   Diabetes mellitus 2010   A1c 6.7%   DVT (deep venous thrombosis) (HCC) 2006   post immobilization post cns surgery   GERD (gastroesophageal reflux disease)    very mild   Granulomatous lung disease (HCC) 2002   incidental Xray finding   Hyperlipidemia    Hypertension 2004   Hypertensive response on Stress Test   Paralysis (HCC) 2006   post cervical fusion with spinal sac tear  with hematoma    PTE (pulmonary thromboembolism) (HCC) 2006    Assessment: Patient Reported Symptoms: Cognitive Cognitive Status: Alert and oriented to person, place, and time, Normal speech and language skills      Neurological Neurological Review of Symptoms: No symptoms reported    HEENT HEENT Symptoms Reported: No symptoms reported      Cardiovascular Cardiovascular Symptoms Reported: No symptoms reported Does patient have uncontrolled Hypertension?: No Cardiovascular Management Strategies: Medication therapy, Routine screening, Adequate rest Weight: 150 lb (68 kg) Cardiovascular Comment: The patient's Amlodipine  has been discontinued as daily. Her BP is 126/60  Respiratory Respiratory Symptoms Reported: Productive cough Additional Respiratory Details: The patient wears oxygen  continuous and she tries to go short periods of time without wearing it. O2 sats hover around 89-90% when she is off  of it. She has asked for portable tanks. She continues utilizing her nebulizer and states she has more energy this week Respiratory Management Strategies: Oxygen  therapy, Adequate rest, Pulmonary rehab, Breathing exercise, Coping strategies, Routine screening  Endocrine Endocrine Symptoms Reported: No symptoms reported Is patient diabetic?: Yes Is patient checking blood sugars at home?: No    Gastrointestinal Gastrointestinal Symptoms Reported: No symptoms reported      Genitourinary Genitourinary Symptoms Reported: No symptoms reported    Integumentary Integumentary Symptoms Reported: No symptoms reported    Musculoskeletal Musculoskelatal Symptoms Reviewed: No symptoms reported Musculoskeletal Comment: The patient is declining PT. States she [facility]      Psychosocial Psychosocial Symptoms Reported: No symptoms reported         Today's Vitals   07/03/24 1119  Weight: 150 lb (68 kg)      Medications Reviewed Today     Reviewed by [name], RN (Case Manager) on 07/03/24 at 1039  Med List Status: <None>   Medication Order Taking? Sig Documenting Provider Last Dose Status Informant  Accu-Chek Softclix Lancets lancets [id number] Yes USE TO TEST BLOOD SUGAR TWICE DAILY AS DIRECTED Antonio Meth, Yvonne R, DO  Active Self, Pharmacy Records  albuterol  (PROVENTIL ) (2.5 MG/3ML) 0.083% nebulizer solution [id number] Yes Take 3 mLs (2.5 mg total) by nebulization every 6 (six) hours as needed for wheezing or shortness of breath. Neysa Reggy BIRCH, MD  Active Self, Pharmacy Records  albuterol  (VENTOLIN  [facility]) [phone] [id number] Base) MCG/ACT inhaler  [id number] Yes USE 2 INHALATIONS BY MOUTH  EVERY 4 TO 6 HOURS AS  NEEDED  Patient taking differently: Inhale 2 puffs into the lungs every 4 (four) hours as needed for wheezing or shortness of breath. USE 2 INHALATIONS BY MOUTH  EVERY 4 TO 6 HOURS AS  NEEDED   Neysa Reggy BIRCH, MD  Active Self, Pharmacy Records  amLODipine   (NORVASC ) 2.5 MG tablet [id number] Yes Take 1 tablet (2.5 mg total) by mouth daily.  Patient taking differently: Take 2.5 mg by mouth daily as needed.   Cox, Amy N, DO  Active   azelastine  (ASTELIN ) 0.1 % nasal spray [id number] Yes Use 1-2 puffs in each nostril once or twice daily  Patient taking differently: Place 2 sprays into both nostrils 2 (two) times daily. Use 1-2 puffs in each nostril once or twice daily   Neysa Reggy D, MD  Active Self, Pharmacy Records  benzonatate  (TESSALON ) 200 MG capsule [id number] Yes Take 1 capsule (200 mg total) by mouth 3 (three) times daily as needed (cough). Cox, Amy N, DO  Active   blood glucose meter kit and supplies KIT [id number] Yes Dispense based on patient and insurance preference. Use up to four times daily as directed. Antonio Cyndee [name], DO  Active Self, Pharmacy Records  BREO ELLIPTA  100-25 MCG/ACT AEPB [id number]  Inhale 1 puff into the lungs daily.  Patient not taking: Reported on 06/27/2024   Neysa Reggy BIRCH, MD  Active Self, Pharmacy Records  Calcium Citrate-Vitamin D  (CALCIUM + D PO) [id number] Yes Take 600 mg by mouth in the morning and at bedtime. [provider]  Active Self, Pharmacy Records  celecoxib  (CELEBREX ) 200 MG capsule [id number]  Take 1 capsule (200 mg total) by mouth daily. Antonio Cyndee, Yvonne R, DO  Active   cetirizine (ZYRTEC) 10 MG tablet [id number] Yes Take 10 mg by mouth daily as needed for allergies. [provider]  Active Self, Pharmacy Records  cholecalciferol  (VITAMIN D3) 25 MCG (1000 UNIT) tablet [id number] Yes Take 1,000 Units by mouth daily. [provider]  Active Self, Pharmacy Records  clonazepam  (KLONOPIN ) 0.25 MG disintegrating tablet [id number] Yes Take 1 tablet (0.25 mg total) by mouth 2 (two) times daily as needed (anxiety). Eldonna Elspeth PARAS, MD  Active   clotrimazole -betamethasone  (LOTRISONE ) cream [id number] Yes Apply 1 Application topically daily. Antonio Cyndee [name], DO  Active Self,  Pharmacy Records  cyclobenzaprine  (FLEXERIL ) 10 MG tablet [id number] Yes TAKE 1 TABLET BY MOUTH THREE TIMES DAILY AS NEEDED FOR HIP CONCERNS  Patient taking differently: Take 10 mg by mouth 3 (three) times daily as needed for muscle spasms. TAKE 1 TABLET BY MOUTH THREE TIMES DAILY AS NEEDED FOR HIP CONCERNS   Antonio Cyndee [name], DO  Active Self, Pharmacy Records  diclofenac Sodium (VOLTAREN) 1 % GEL [id number] Yes Apply 2 g topically daily as needed (Pain). [provider]  Active Self, Pharmacy Records  ezetimibe  (ZETIA ) 10 MG tablet [id number] Yes Take 1 tablet (10 mg total) by mouth daily. Antonio Cyndee [name], DO  Active Self, Pharmacy Records  fenofibrate  160 MG tablet [id number] Yes TAKE 1 TABLET(160 MG) BY MOUTH DAILY  Patient taking differently: Take 160 mg by mouth daily.   Mona Vinie BROCKS, MD  Active Self, Pharmacy Records  FEROSUL [id number] Fe) MG tablet [id number] Yes TAKE 1 TABLET(325 MG) BY MOUTH DAILY WITH BREAKFAST  Patient taking differently: Take 325 mg by mouth daily.   Lowne Chase, Yvonne R, DO  Active  Self, Pharmacy Records  fluticasone  furoate-vilanterol (BREO ELLIPTA ) 200-25 MCG/ACT AEPB [id number] Yes Inhale 1 puff into the lungs daily. Antonio Cyndee [name], DO  Active Self, Pharmacy Records  glucose blood test strip [id number] Yes Use to check sugars twice daily Antonio Cyndee, Yvonne R, DO  Active Self, Pharmacy Records  ipratropium-albuterol  (DUONEB) 0.5-2.5 (3) MG/3ML SOLN [id number] Yes Take 3 mLs by nebulization every 6 (six) hours as needed. Cox, Amy N, DO  Active   Iron , Ferrous Sulfate , 325 (65 Fe) MG TABS [id number]  Take 325 mg by mouth daily. Antonio Cyndee Jamee R, DO  Active   Lidocaine  4 % [facility] [id number] Yes Apply 1 patch topically as needed (back pain). [provider]  Active Self, Pharmacy Records  metFORMIN  (GLUCOPHAGE -XR) 500 MG 24 hr tablet [id number] Yes Take 1 tablet with morning meal, 1 tablet with lunch and 2 tablets with evening meal.   Patient taking differently: Take 500 mg by mouth in the morning, at noon, and at bedtime. Take 1 tablet with morning meal, 1 tablet with lunch and 1 tablet with evening meal.   Antonio Cyndee, Yvonne R, DO  Active Self, Pharmacy Records  methocarbamol  (ROBAXIN ) 500 MG tablet [id number] Yes TAKE 1 TABLET(500 MG) BY MOUTH EVERY 6 HOURS AS NEEDED FOR MUSCLE SPASMS  Patient taking differently: Take 1,000 mg by mouth every 6 (six) hours as needed for muscle spasms.   Lowne Chase, Yvonne R, DO  Active Self, Pharmacy Records  mirabegron  ER (MYRBETRIQ ) 50 MG TB24 tablet [id number] Yes TAKE 1 TABLET(50 MG) BY MOUTH DAILY  Patient taking differently: Take 50 mg by mouth daily.   Antonio Cyndee [name], DO  Active Self, Pharmacy Records  Multiple Vitamin (MULTIVITAMIN WITH MINERALS) TABS tablet [id number] Yes Take 1 tablet by mouth daily. Centrum One a day - 50+ [provider]  Active Self, Pharmacy Records  Multiple Vitamins-Minerals (PRESERVISION AREDS 2+MULTI VIT PO) [id number] Yes Take 1 tablet by mouth in the morning and at bedtime. [provider]  Active Self, Pharmacy Records  mupirocin  ointment (BACTROBAN ) 2 % [id number]  Apply 1 Application topically 3 (three) times daily.  Patient not taking: Reported on 06/27/2024   Cyndi Shaver, PA-C  Active Self, Pharmacy Records  ondansetron  (ZOFRAN ) 4 MG tablet [id number] Yes Take 1 tablet (4 mg total) by mouth every 6 (six) hours as needed for nausea. Sherre Greig SAILOR, DO  Active   OXYGEN  [id number] Yes Inhale 2 L/min into the lungs continuous. [provider]  Active   Polyethyl Glycol-Propyl Glycol (SYSTANE OP) [id number] Yes Place 1 drop into both eyes daily as needed (dry eyes). [provider]  Active Self, Pharmacy Records  polyethylene glycol (MIRALAX  / GLYCOLAX ) packet [id number] Yes Take 17 g by mouth daily as needed for mild constipation.  [provider]  Active Self, Pharmacy Records  pregabalin  (LYRICA ) 100 MG  capsule [id number] Yes Take 1 capsule (100 mg total) by mouth 3 (three) times daily. Eldonna Elspeth PARAS, MD  Active   Trolamine Salicylate (ASPERCREME [place]) [id number]  Apply 1 application topically daily as needed (back pain).  Patient not taking: Reported on 06/27/2024   [provider]  Active Self, Pharmacy Records  VASCEPA  1 g capsule [id number] Yes TAKE 2 CAPSULES(2 GRAMS) BY MOUTH TWICE DAILY  Patient taking differently: Take 4 g by mouth 2 (two) times daily. TAKE 2 CAPSULES(2 GRAMS) BY MOUTH TWICE DAILY   Hilty, Vinie BROCKS, MD  Active Self, Pharmacy Records  Recommendation:   Continue Current Plan of Care  Follow Up Plan:   Telephone follow-up in 1 week  Medford Balboa, BSN, RN [place]  VBCI - [facility] RN Care Manager [phone]     "

## 2024-07-03 NOTE — Progress Notes (Signed)
 " Discussed the use of AI scribe software for clinical note transcription with the patient, who gave verbal consent to proceed.  History of Present Illness [name] is an 84 year old female with history of asthma diagnosed at age 64. She initially used Advair  twice daily, which was effective. She switched to Breo once daily last year due to insurance changes and finds it effective.  She uses a rescue inhaler occasionally. She reports overall stability in her asthma symptoms. She had flare on Nov treated with prednisone .   She experiences shortness of breath occasionally, particularly in damp environments, which she identifies as a trigger for her asthma. She tries to walk in the park regularly and notes that her asthma does not cause significant wheezing or shortness of breath during these activities. She uses her rescue inhaler a couple of times a week and does not wake up at night due to shortness of breath.  Never smoked cigarettes.   Interval history Patient was admitted for LUL, LLL pneumonia with 5 days of IV antibiotics, prednisone  taper and she was discharged with oxygen  2L on 06/16/2024. She was given a nebulizer. She is increasing activity by walking in the halls and going outside her apartment. Off oxygen  during these activities, her saturation dropped to 90%, so she resumed using oxygen .  She is using Breo 200/25 mcg, increased from 100/25 mcg after hospitalization. She previously used Advair  250/50 mcg twice daily but changed due to insurance. She uses albuterol  as needed for coughing fits, which have been improving.  She uses the nebulizer three times a day. She initially used it only at night for coughing but increased to three times daily. She uses a mask for the nebulizer because the mouthpiece causes jaw discomfort.  Overall she feels better and close to her baseline. Denied significant wheezing.  Asthma Control Test ACT Total Score  02/05/2024  3:06 PM 18  05/26/2022 10:14  AM 24  05/26/2021  9:54 AM 15   Allergies  Allergen Reactions   Statins Other (See Comments)    Myalgias and muscle weakness   Jardiance  [Empagliflozin ] Rash    PFT 04/08/24: normal spirometry with moderate reduction of DLCO (Z-2.6), hemoglobin 11. No obstruction    Latest Ref Rng & Units 04/08/2024   10:28 AM  PFT Results  FVC-Pre L 2.07   FVC-Predicted Pre % 85   Pre FEV1/FVC % % 75   FEV1-Pre L 1.54   FEV1-Predicted Pre % 86   DLCO uncorrected ml/min/mmHg 11.53   DLCO UNC% % 63   DLCO corrected ml/min/mmHg 12.36   DLCO COR %Predicted % 67   DLVA Predicted % 84     Latest Reference Range & Units 05/14/07 11:59 06/16/08 10:38 09/16/09 10:21 11/08/10 09:03 08/09/11 09:29 04/05/13 08:54 06/02/14 09:26 11/24/15 15:12 10/17/16 16:04 12/05/16 11:46 12/11/17 10:05 12/28/17 13:28 03/30/18 13:14 07/19/18 11:11 01/28/19 14:25 03/15/21 14:18 05/07/21 16:15 09/09/21 14:03 09/16/21 12:19 09/19/21 19:36 12/13/21 09:34 01/15/22 09:00 06/16/22 10:27 12/15/22 11:10 06/22/23 11:22 08/25/23 11:43  Eosinophils Absolute 0.0 - 0.5 K/uL 0.1 0.1 0.2 0.1 0.1 0.1 0.1 0.1 0.1 0.1 0.2 0.1 0.1 0.2 0.1 0.2 202 0.2 0.1 0.0 0.1 0.2 0.2 0.2 0.2 0.3     Allergies  Allergen Reactions   Statins Other (See Comments)    Myalgias and muscle weakness   Jardiance  [Empagliflozin ] Rash      has a past medical history of Anemia, Arthritis, Asthma, Basal cell cancer, Chronic kidney disease (1963), Complication of anesthesia, Diabetes mellitus (2010),  DVT (deep venous thrombosis) (HCC) (2006), GERD (gastroesophageal reflux disease), Granulomatous lung disease (HCC) (2002), Hyperlipidemia, Hypertension (2004), Paralysis (HCC) (2006), and PTE (pulmonary thromboembolism) (HCC) (2006).   reports that she has never smoked. She has been exposed to tobacco smoke. She has never used smokeless tobacco.  Past Surgical History:  Procedure Laterality Date   ABDOMINAL EXPOSURE N/A 08/12/2016   Procedure: ABDOMINAL EXPOSURE;   Surgeon: Krystal JULIANNA Doing, MD;  Location: [facility] OR;  Service: Vascular;  Laterality: N/A;   ANTERIOR LAT LUMBAR FUSION N/A 08/12/2016   Procedure: LUMBAR TWO-THREE, LUMBAR THREE-FOUR, LUMBAR FOUR-FIVE  ANTEROLATERAL LUMBAR INTERBODY FUSION;  Surgeon: Fairy Levels, MD;  Location: [facility] OR;  Service: Neurosurgery;  Laterality: N/A;  L2-3 L3-4 L4-5 Anterolateral lumbar interbody fusion   ANTERIOR LUMBAR FUSION N/A 08/12/2016   Procedure: Lumbar Five-Sacral One Anterior lumbar interbody fusion with Dr. Krystal Early to assist;  Surgeon: Fairy Levels, MD;  Location: [facility] OR;  Service: Neurosurgery;  Laterality: N/A;  L5-S1 Anterior lumbar interbody fusion with Dr. Krystal Early to assist   BUNIONECTOMY     CATARACT EXTRACTION Right 12/31/2018   CATARACT EXTRACTION Left 12/17/2018   CERVICAL FUSION  2006   Dr Louis, NS;post op hematoma & cns leak & urinary retention   COLONOSCOPY  1992 & 2002   negative   epidural steroids  2006   cervical spine   LUMBAR PERCUTANEOUS PEDICLE SCREW 4 LEVEL N/A 08/12/2016   Procedure: LUMBAR TWO-SACRAL ONE Percuataneous Pedicle Screws;  Surgeon: Fairy Levels, MD;  Location: [facility] OR;  Service: Neurosurgery;  Laterality: N/A;   ROTATOR CUFF REPAIR  2009   Right   SEPTOPLASTY     TONSILLECTOMY  84 years old   TOTAL HIP ARTHROPLASTY  06/01/2012   Procedure: TOTAL HIP ARTHROPLASTY ANTERIOR APPROACH;  Surgeon: Lonni CINDERELLA Poli, MD;  Location: [facility] ORS;  Service: Orthopedics;  Laterality: Left;  Left Total Hip Arthroplasty   TOTAL HIP ARTHROPLASTY Right 02/23/2018   Procedure: RIGHT TOTAL HIP ARTHROPLASTY ANTERIOR APPROACH;  Surgeon: Poli Lonni CINDERELLA, MD;  Location: [facility] ORS;  Service: Orthopedics;  Laterality: Right;   TUBAL LIGATION      Allergies  Allergen Reactions   Statins Other (See Comments)    Myalgias and muscle weakness   Jardiance  [Empagliflozin ] Rash    Immunization History  Administered Date(s) Administered   Fluad  Quad(high Dose 65+) 01/28/2019, 03/31/2020,  03/15/2021, 03/01/2022, 02/29/2024   INFLUENZA, HIGH DOSE SEASONAL PF 03/19/2013, 03/21/2016   Influenza Inj Mdck Quad With Preservative 03/23/2018   Influenza Split 03/15/2012, 03/12/2023   Influenza Whole 02/27/2009   Influenza,inj,Quad PF,6+ Mos 03/13/2014, 02/16/2015   Influenza-Unspecified 03/15/2012, 01/28/2014, 03/13/2014, 02/16/2015, 02/14/2017   Moderna SARS-COV2 Booster Vaccination 04/14/2020, 12/04/2020   Moderna Sars-Covid-2 Vaccination 06/11/2019, 07/09/2019, 03/04/2023   Pfizer(Comirnaty )Fall Seasonal Vaccine 12 years and older 06/01/2022   Pneumococcal Conjugate-13 05/16/2013   Pneumococcal Polysaccharide-23 09/29/2014, 02/07/2020   Respiratory Syncytial Virus Vaccine ,Recomb Aduvanted(Arexvy ) 08/08/2023   Tdap 10/28/2010, 04/30/2021   Unspecified SARS-COV-2 Vaccination 02/29/2024   Zoster Recombinant(Shingrix) 02/11/2019, 04/12/2019   Zoster, Live 05/12/2014    Family History  Problem Relation Age of Onset   Cancer Mother        cns cancer   COPD Father        emphysema   Diabetes Sister        TWIN sister ; also Fibromyalgia ; S/P stent 2004   Heart disease Sister        stents @ 60 & 68   Stroke Maternal Grandmother  in  late 50s   Transient ischemic attack Paternal Aunt      Current Outpatient Medications:    Accu-Chek Softclix Lancets lancets, USE TO TEST BLOOD SUGAR TWICE DAILY AS DIRECTED, Disp: 100 each, Rfl: 12   albuterol  (PROVENTIL ) (2.5 MG/3ML) 0.083% nebulizer solution, Take 3 mLs (2.5 mg total) by nebulization every 6 (six) hours as needed for wheezing or shortness of breath., Disp: 75 mL, Rfl: 12   albuterol  (VENTOLIN  HFA) 108 (90 Base) MCG/ACT inhaler, USE 2 INHALATIONS BY MOUTH  EVERY 4 TO 6 HOURS AS  NEEDED (Patient taking differently: Inhale 2 puffs into the lungs every 4 (four) hours as needed for wheezing or shortness of breath. USE 2 INHALATIONS BY MOUTH  EVERY 4 TO 6 HOURS AS  NEEDED), Disp: 51 g, Rfl: 3   benzonatate  (TESSALON ) 200 MG  capsule, Take 1 capsule (200 mg total) by mouth 3 (three) times daily as needed (cough)., Disp: 20 capsule, Rfl: 0   blood glucose meter kit and supplies KIT, Dispense based on patient and insurance preference. Use up to four times daily as directed., Disp: 1 each, Rfl: 0   BREO ELLIPTA  100-25 MCG/ACT AEPB, Inhale 1 puff into the lungs daily., Disp: 60 each, Rfl: 3   Calcium Citrate-Vitamin D  (CALCIUM + D PO), Take 600 mg by mouth in the morning and at bedtime., Disp: , Rfl:    celecoxib  (CELEBREX ) 200 MG capsule, Take 1 capsule (200 mg total) by mouth daily., Disp: 90 capsule, Rfl: 3   cetirizine (ZYRTEC) 10 MG tablet, Take 10 mg by mouth daily as needed for allergies., Disp: , Rfl:    cholecalciferol  (VITAMIN D3) 25 MCG (1000 UNIT) tablet, Take 1,000 Units by mouth daily., Disp: , Rfl:    clonazepam  (KLONOPIN ) 0.25 MG disintegrating tablet, Take 1 tablet (0.25 mg total) by mouth 2 (two) times daily as needed (anxiety)., Disp: 4 tablet, Rfl: 0   clotrimazole -betamethasone  (LOTRISONE ) cream, Apply 1 Application topically daily., Disp: 30 g, Rfl: 0   cyclobenzaprine  (FLEXERIL ) 10 MG tablet, TAKE 1 TABLET BY MOUTH THREE TIMES DAILY AS NEEDED FOR HIP CONCERNS (Patient taking differently: Take 10 mg by mouth 3 (three) times daily as needed for muscle spasms. TAKE 1 TABLET BY MOUTH THREE TIMES DAILY AS NEEDED FOR HIP CONCERNS), Disp: 30 tablet, Rfl: 2   diclofenac Sodium (VOLTAREN) 1 % GEL, Apply 2 g topically daily as needed (Pain)., Disp: , Rfl:    ezetimibe  (ZETIA ) 10 MG tablet, Take 1 tablet (10 mg total) by mouth daily., Disp: 90 tablet, Rfl: 1   fenofibrate  160 MG tablet, TAKE 1 TABLET(160 MG) BY MOUTH DAILY (Patient taking differently: Take 160 mg by mouth daily.), Disp: 90 tablet, Rfl: 2   FEROSUL 325 (65 Fe) MG tablet, TAKE 1 TABLET(325 MG) BY MOUTH DAILY WITH BREAKFAST (Patient taking differently: Take 325 mg by mouth daily.), Disp: 90 tablet, Rfl: 1   fluticasone  furoate-vilanterol (BREO ELLIPTA )  200-25 MCG/ACT AEPB, Inhale 1 puff into the lungs daily., Disp: 1 each, Rfl: 11   glucose blood test strip, Use to check sugars twice daily, Disp: 100 each, Rfl: 12   ipratropium-albuterol  (DUONEB) 0.5-2.5 (3) MG/3ML SOLN, Take 3 mLs by nebulization every 6 (six) hours as needed., Disp: 180 mL, Rfl: 1   Iron , Ferrous Sulfate , 325 (65 Fe) MG TABS, Take 325 mg by mouth daily., Disp: 90 tablet, Rfl: 1   Lidocaine  4 % PTCH, Apply 1 patch topically as needed (back pain)., Disp: , Rfl:  metFORMIN  (GLUCOPHAGE -XR) 500 MG 24 hr tablet, Take 1 tablet with morning meal, 1 tablet with lunch and 2 tablets with evening meal. (Patient taking differently: Take 500 mg by mouth in the morning, at noon, and at bedtime. Take 1 tablet with morning meal, 1 tablet with lunch and 1 tablet with evening meal.), Disp: 360 tablet, Rfl: 3   methocarbamol  (ROBAXIN ) 500 MG tablet, TAKE 1 TABLET(500 MG) BY MOUTH EVERY 6 HOURS AS NEEDED FOR MUSCLE SPASMS (Patient taking differently: Take 1,000 mg by mouth every 6 (six) hours as needed for muscle spasms.), Disp: 45 tablet, Rfl: 1   mirabegron  ER (MYRBETRIQ ) 50 MG TB24 tablet, TAKE 1 TABLET(50 MG) BY MOUTH DAILY (Patient taking differently: Take 50 mg by mouth daily.), Disp: 90 tablet, Rfl: 3   Multiple Vitamin (MULTIVITAMIN WITH MINERALS) TABS tablet, Take 1 tablet by mouth daily. Centrum One a day - 50+, Disp: , Rfl:    Multiple Vitamins-Minerals (PRESERVISION AREDS 2+MULTI VIT PO), Take 1 tablet by mouth in the morning and at bedtime., Disp: , Rfl:    mupirocin  ointment (BACTROBAN ) 2 %, Apply 1 Application topically 3 (three) times daily., Disp: 30 g, Rfl: 3   OXYGEN , Inhale 2 L/min into the lungs continuous., Disp: , Rfl:    Polyethyl Glycol-Propyl Glycol (SYSTANE OP), Place 1 drop into both eyes daily as needed (dry eyes)., Disp: , Rfl:    polyethylene glycol (MIRALAX  / GLYCOLAX ) packet, Take 17 g by mouth daily as needed for mild constipation. , Disp: , Rfl:    pregabalin   (LYRICA ) 100 MG capsule, Take 1 capsule (100 mg total) by mouth 3 (three) times daily., Disp: 6 capsule, Rfl: 1   VASCEPA  1 g capsule, TAKE 2 CAPSULES(2 GRAMS) BY MOUTH TWICE DAILY (Patient taking differently: Take 4 g by mouth 2 (two) times daily. TAKE 2 CAPSULES(2 GRAMS) BY MOUTH TWICE DAILY), Disp: 360 capsule, Rfl: 3   amLODipine  (NORVASC ) 2.5 MG tablet, Take 1 tablet (2.5 mg total) by mouth daily. (Patient not taking: Reported on 07/03/2024), Disp: 30 tablet, Rfl: 1   azelastine  (ASTELIN ) 0.1 % nasal spray, Use 1-2 puffs in each nostril once or twice daily (Patient not taking: Reported on 07/03/2024), Disp: 30 mL, Rfl: 12   ondansetron  (ZOFRAN ) 4 MG tablet, Take 1 tablet (4 mg total) by mouth every 6 (six) hours as needed for nausea. (Patient not taking: Reported on 07/03/2024), Disp: 20 tablet, Rfl: 0   Trolamine Salicylate (ASPERCREME EX), Apply 1 application topically daily as needed (back pain). (Patient not taking: Reported on 07/03/2024), Disp: , Rfl:       Objective:   Vitals:   07/03/24 1514  BP: 132/70  Pulse: 60  Temp: 98.3 F (36.8 C)  TempSrc: Oral  SpO2: 95%     Estimated body mass index is 26.57 kg/m as calculated from the following:   Height as of 06/27/24: 5' 3 (1.6 m).   Weight as of an earlier encounter on 07/03/24: 150 lb (68 kg).   Physical Exam General: No distress. Look well.  Neuro: Alert and Oriented x 3. GCS 15. Speech normal Psych: Pleasant Resp:  normal breath sounds, no wheezing, no resp distress CVS: Normal heart sounds. No murmurs HEENT: Normal upper airway. PEERL +. No post nasal drip  CT scan 06/11/2024 1. Negative for significant acute pulmonary embolus by CTA. 2. Densely consolidative left upper lobe pneumonia and additional patchy pneumonia in the superior segment of the left lower lobe. 3. Recommend follow-up after medical therapy to  document resolution. 4. Aortic atherosclerosis.   Walking oxygen  07/03/2024  - she walked 3 laps with the lowest  O2 of 93%.  Her last Feno was 11 on 01/2024   Assessment/   Assessment & Plan Moderate persistent asthma, uncomplicated Asthma well-controlled with Breo. No significant wheezing or shortness of breath. Last flare last year in 2024. She had a pneumonia 3 weeks ago, treated with prednisone  and ATB. Her PFT today showed normal spirometry, moderate reduction of DLCO (z-2.6). Hemoglobin 11.4 Plan: - Continue Breo 200 (increased from the last admission). - Albuterol  inhaler and neb as need it  Pneumonia, left lung, recovering complicated with O2 requirement. Recovering from left lung pneumonia. Her walking test showed nadir O2 sat 93%, she does not meet the POC criteria. - No need for O2 therapy. Use O2 as need it for heavy exercise, by checking her o2 sat if it is less <88%. - Ordered repeat chest CT in three months to assess resolution.  I personally spent a total of  30 minutes in the care of the patient today including preparing to see the patient, getting/reviewing separately obtained history, performing a medically appropriate exam/evaluation, counseling and educating, placing orders, independently interpreting results, and communicating results.   Return in about 3 months (around 09/30/2024), or please change the appt on 3/12 to may appointment.SABRA Marny Patch, MD Pulmonary and Critical Care Medicine [facility]  "

## 2024-07-05 ENCOUNTER — Telehealth: Payer: Self-pay | Admitting: Internal Medicine

## 2024-07-05 NOTE — Telephone Encounter (Signed)
" °*  STAT* If patient is at the pharmacy, call can be transferred to refill team.   1. Which medications need to be refilled? (please list name of each medication and dose if known) fenofibrate  160 MG tablet    2. Would you like to learn more about the convenience, safety, & potential cost savings by using the [facility]? No      3. Are you open to using the [facility] (Type [facility]. No    4. Which pharmacy/location (including street and city if local pharmacy) is medication to be sent to?[facility]    5. Do they need a 30 day or 90 day supply? 90 day    "

## 2024-07-10 ENCOUNTER — Telehealth

## 2024-08-08 ENCOUNTER — Ambulatory Visit

## 2024-09-02 ENCOUNTER — Ambulatory Visit: Admitting: Family Medicine

## 2024-09-09 ENCOUNTER — Ambulatory Visit: Admitting: Family Medicine

## 2024-09-26 ENCOUNTER — Ambulatory Visit: Admitting: Nurse Practitioner

## 2024-09-26 ENCOUNTER — Ambulatory Visit (HOSPITAL_BASED_OUTPATIENT_CLINIC_OR_DEPARTMENT_OTHER): Admitting: Nurse Practitioner

## 2024-10-01 ENCOUNTER — Ambulatory Visit

## 2025-04-17 ENCOUNTER — Ambulatory Visit
